# Patient Record
Sex: Male | Born: 1940
Health system: Southern US, Community
[De-identification: ages and names within clinical notes are randomized; demographics above are authoritative.]

## PROBLEM LIST (undated history)

## (undated) DIAGNOSIS — J189 Pneumonia, unspecified organism: Secondary | ICD-10-CM

## (undated) DIAGNOSIS — K8020 Calculus of gallbladder without cholecystitis without obstruction: Secondary | ICD-10-CM

## (undated) DIAGNOSIS — B019 Varicella without complication: Secondary | ICD-10-CM

## (undated) DIAGNOSIS — I4719 Other supraventricular tachycardia: Secondary | ICD-10-CM

## (undated) DIAGNOSIS — K219 Gastro-esophageal reflux disease without esophagitis: Secondary | ICD-10-CM

## (undated) DIAGNOSIS — I472 Ventricular tachycardia: Secondary | ICD-10-CM

## (undated) DIAGNOSIS — I4510 Unspecified right bundle-branch block: Secondary | ICD-10-CM

## (undated) DIAGNOSIS — N183 Chronic kidney disease, stage 3 unspecified: Secondary | ICD-10-CM

## (undated) DIAGNOSIS — E119 Type 2 diabetes mellitus without complications: Secondary | ICD-10-CM

## (undated) DIAGNOSIS — I071 Rheumatic tricuspid insufficiency: Secondary | ICD-10-CM

## (undated) DIAGNOSIS — E7800 Pure hypercholesterolemia, unspecified: Secondary | ICD-10-CM

## (undated) DIAGNOSIS — M1990 Unspecified osteoarthritis, unspecified site: Secondary | ICD-10-CM

## (undated) DIAGNOSIS — Z7189 Other specified counseling: Secondary | ICD-10-CM

## (undated) DIAGNOSIS — I10 Essential (primary) hypertension: Secondary | ICD-10-CM

## (undated) DIAGNOSIS — I5042 Chronic combined systolic (congestive) and diastolic (congestive) heart failure: Secondary | ICD-10-CM

## (undated) DIAGNOSIS — D649 Anemia, unspecified: Secondary | ICD-10-CM

## (undated) DIAGNOSIS — K760 Fatty (change of) liver, not elsewhere classified: Secondary | ICD-10-CM

## (undated) DIAGNOSIS — I471 Supraventricular tachycardia: Secondary | ICD-10-CM

## (undated) DIAGNOSIS — C9000 Multiple myeloma not having achieved remission: Secondary | ICD-10-CM

## (undated) DIAGNOSIS — I4729 Other ventricular tachycardia: Secondary | ICD-10-CM

## (undated) DIAGNOSIS — K2270 Barrett's esophagus without dysplasia: Secondary | ICD-10-CM

## (undated) DIAGNOSIS — C4491 Basal cell carcinoma of skin, unspecified: Secondary | ICD-10-CM

## (undated) DIAGNOSIS — I340 Nonrheumatic mitral (valve) insufficiency: Secondary | ICD-10-CM

## (undated) DIAGNOSIS — Z8489 Family history of other specified conditions: Secondary | ICD-10-CM

## (undated) DIAGNOSIS — Z950 Presence of cardiac pacemaker: Secondary | ICD-10-CM

## (undated) DIAGNOSIS — I8500 Esophageal varices without bleeding: Secondary | ICD-10-CM

## (undated) DIAGNOSIS — I428 Other cardiomyopathies: Secondary | ICD-10-CM

## (undated) HISTORY — PX: TRANSTHORACIC ECHOCARDIOGRAM: SHX275

## (undated) HISTORY — PX: TONSILLECTOMY AND ADENOIDECTOMY: SUR1326

## (undated) HISTORY — PX: CARPAL TUNNEL RELEASE: SHX101

## (undated) HISTORY — PX: LUMBAR DISC SURGERY: SHX700

## (undated) HISTORY — DX: Other specified counseling: Z71.89

## (undated) HISTORY — PX: BACK SURGERY: SHX140

## (undated) HISTORY — DX: Multiple myeloma not having achieved remission: C90.00

## (undated) HISTORY — PX: JOINT REPLACEMENT: SHX530

## (undated) HISTORY — DX: Fatty (change of) liver, not elsewhere classified: K76.0

## (undated) HISTORY — DX: Calculus of gallbladder without cholecystitis without obstruction: K80.20

## (undated) HISTORY — PX: CATARACT EXTRACTION W/ INTRAOCULAR LENS  IMPLANT, BILATERAL: SHX1307

## (undated) HISTORY — DX: Varicella without complication: B01.9

## (undated) HISTORY — PX: BASAL CELL CARCINOMA EXCISION: SHX1214

---

## 2000-06-30 ENCOUNTER — Encounter: Admission: RE | Admit: 2000-06-30 | Discharge: 2000-07-07 | Payer: Self-pay | Admitting: Specialist

## 2005-05-07 ENCOUNTER — Inpatient Hospital Stay (HOSPITAL_COMMUNITY): Admission: EM | Admit: 2005-05-07 | Discharge: 2005-05-10 | Payer: Self-pay | Admitting: Emergency Medicine

## 2005-05-11 ENCOUNTER — Inpatient Hospital Stay (HOSPITAL_COMMUNITY): Admission: EM | Admit: 2005-05-11 | Discharge: 2005-05-18 | Payer: Self-pay | Admitting: Emergency Medicine

## 2005-05-15 ENCOUNTER — Ambulatory Visit: Payer: Self-pay | Admitting: Internal Medicine

## 2008-04-15 HISTORY — PX: TOTAL KNEE ARTHROPLASTY: SHX125

## 2008-06-10 ENCOUNTER — Inpatient Hospital Stay (HOSPITAL_COMMUNITY): Admission: RE | Admit: 2008-06-10 | Discharge: 2008-06-13 | Payer: Self-pay | Admitting: Orthopedic Surgery

## 2008-06-30 ENCOUNTER — Emergency Department (HOSPITAL_COMMUNITY): Admission: EM | Admit: 2008-06-30 | Discharge: 2008-07-01 | Payer: Self-pay | Admitting: Emergency Medicine

## 2008-07-07 ENCOUNTER — Encounter: Admission: RE | Admit: 2008-07-07 | Discharge: 2008-08-18 | Payer: Self-pay | Admitting: Orthopedic Surgery

## 2010-07-26 LAB — POCT I-STAT, CHEM 8
BUN: 28 mg/dL — ABNORMAL HIGH (ref 6–23)
Creatinine, Ser: 1.8 mg/dL — ABNORMAL HIGH (ref 0.4–1.5)
Glucose, Bld: 119 mg/dL — ABNORMAL HIGH (ref 70–99)
HCT: 36 % — ABNORMAL LOW (ref 39.0–52.0)
Sodium: 139 mEq/L (ref 135–145)

## 2010-07-26 LAB — CBC
Hemoglobin: 11.2 g/dL — ABNORMAL LOW (ref 13.0–17.0)
MCHC: 34.1 g/dL (ref 30.0–36.0)
MCV: 96.3 fL (ref 78.0–100.0)
RBC: 3.4 MIL/uL — ABNORMAL LOW (ref 4.22–5.81)
RDW: 13 % (ref 11.5–15.5)
WBC: 4.2 10*3/uL (ref 4.0–10.5)
WBC: 8.1 10*3/uL (ref 4.0–10.5)

## 2010-07-26 LAB — PROTIME-INR: Prothrombin Time: 23.5 seconds — ABNORMAL HIGH (ref 11.6–15.2)

## 2010-07-26 LAB — GLUCOSE, CAPILLARY
Glucose-Capillary: 140 mg/dL — ABNORMAL HIGH (ref 70–99)
Glucose-Capillary: 133 mg/dL — ABNORMAL HIGH (ref 70–99)
Glucose-Capillary: 141 mg/dL — ABNORMAL HIGH (ref 70–99)

## 2010-07-31 LAB — BASIC METABOLIC PANEL
BUN: 23 mg/dL (ref 6–23)
BUN: 23 mg/dL (ref 6–23)
CO2: 26 mEq/L (ref 19–32)
CO2: 28 mEq/L (ref 19–32)
Calcium: 8.1 mg/dL — ABNORMAL LOW (ref 8.4–10.5)
Chloride: 100 mEq/L (ref 96–112)
Creatinine, Ser: 1.47 mg/dL (ref 0.4–1.5)
Creatinine, Ser: 1.68 mg/dL — ABNORMAL HIGH (ref 0.4–1.5)
GFR calc Af Amer: 49 mL/min — ABNORMAL LOW (ref >60)
GFR calc Af Amer: 58 mL/min — ABNORMAL LOW (ref >60)
GFR calc non Af Amer: 46 mL/min — ABNORMAL LOW (ref >60)
Glucose, Bld: 170 mg/dL — ABNORMAL HIGH (ref 70–99)
Glucose, Bld: 186 mg/dL — ABNORMAL HIGH (ref 70–99)
Potassium: 3.6 mEq/L (ref 3.5–5.1)
Potassium: 3.8 mEq/L (ref 3.5–5.1)
Potassium: 4 mEq/L (ref 3.5–5.1)

## 2010-07-31 LAB — COMPREHENSIVE METABOLIC PANEL
ALT: 45 U/L (ref 0–53)
AST: 37 U/L (ref 0–37)
Albumin: 4 g/dL (ref 3.5–5.2)
BUN: 24 mg/dL — ABNORMAL HIGH (ref 6–23)
Potassium: 3.9 mEq/L (ref 3.5–5.1)
Sodium: 142 mEq/L (ref 135–145)
Total Bilirubin: 1 mg/dL (ref 0.3–1.2)

## 2010-07-31 LAB — PROTIME-INR
INR: 1.2 (ref 0.00–1.49)
Prothrombin Time: 24.3 seconds — ABNORMAL HIGH (ref 11.6–15.2)

## 2010-07-31 LAB — CBC
HCT: 29.4 % — ABNORMAL LOW (ref 39.0–52.0)
HCT: 33.5 % — ABNORMAL LOW (ref 39.0–52.0)
HCT: 40.3 % (ref 39.0–52.0)
Hemoglobin: 9.9 g/dL — ABNORMAL LOW (ref 13.0–17.0)
MCHC: 33.8 g/dL (ref 30.0–36.0)
MCHC: 34.3 g/dL (ref 30.0–36.0)
MCV: 97.7 fL (ref 78.0–100.0)
MCV: 98.1 fL (ref 78.0–100.0)
Platelets: 193 10*3/uL (ref 150–400)
RBC: 3.01 MIL/uL — ABNORMAL LOW (ref 4.22–5.81)
RBC: 4.17 MIL/uL — ABNORMAL LOW (ref 4.22–5.81)
RDW: 12.5 % (ref 11.5–15.5)
WBC: 4.6 10*3/uL (ref 4.0–10.5)

## 2010-07-31 LAB — GLUCOSE, CAPILLARY
Glucose-Capillary: 132 mg/dL — ABNORMAL HIGH (ref 70–99)
Glucose-Capillary: 133 mg/dL — ABNORMAL HIGH (ref 70–99)
Glucose-Capillary: 135 mg/dL — ABNORMAL HIGH (ref 70–99)
Glucose-Capillary: 167 mg/dL — ABNORMAL HIGH (ref 70–99)
Glucose-Capillary: 183 mg/dL — ABNORMAL HIGH (ref 70–99)

## 2010-07-31 LAB — TYPE AND SCREEN: ABO/RH(D): A POS

## 2010-07-31 LAB — URINALYSIS, ROUTINE W REFLEX MICROSCOPIC
Ketones, ur: NEGATIVE mg/dL
Specific Gravity, Urine: 1.016 (ref 1.005–1.030)
pH: 6.5 (ref 5.0–8.0)

## 2010-07-31 LAB — ABO/RH: ABO/RH(D): A POS

## 2010-07-31 LAB — APTT: aPTT: 33 seconds (ref 24–37)

## 2010-08-28 NOTE — Op Note (Signed)
NAMEJACKSEN, Dillon Andrews              ACCOUNT NO.:  [id number]   MEDICAL RECORD NO.:  [id number]          PATIENT TYPE:  INP   LOCATION:  0004                         FACILITY:  [facility]   PHYSICIAN:  Gaynelle Arabian, M.D.    DATE OF BIRTH:  1941/02/18   DATE OF PROCEDURE:  06/10/2008  DATE OF DISCHARGE:                               OPERATIVE REPORT   PREOPERATIVE DIAGNOSIS:  Osteoarthritis left knee.   POSTOPERATIVE DIAGNOSIS:  Osteoarthritis left knee.   PROCEDURE:  Left total knee arthroplasty.   SURGEON:  Gaynelle Arabian, M.D.   ASSISTANT:  Alexzandrew L. Perkins, P.A.C.   ANESTHESIA:  Spinal.   ESTIMATED BLOOD LOSS:  Minimal.   DRAINS:  None.   TOURNIQUET TIME:  43 minutes at 300 mmHg.   COMPLICATIONS:  None.   CONDITION:  Stable to recovery.   BRIEF CLINICAL NOTE:  Mr. Deisher is a 70 year old male with end-stage  arthritis of his left knee with progressively worsening pain and  dysfunction.  He has failed nonoperative management and presents now for  left total knee arthroplasty.   PROCEDURE IN DETAIL:  After the successful administration of spinal  anesthetic a tourniquet is placed high on his left thigh and his left  lower extremity was prepped and draped in the usual sterile fashion.  Extremity was wrapped in Esmarch, knee flexed, tourniquet inflated to  300 mmHg.  Midline incision is made with a 10 blade through subcutaneous  tissue to the level of the extensor mechanism.  A fresh blade is used to  make a medial parapatellar arthrotomy.  Soft tissue over the proximal  medial tibia subperiosteally elevated to the joint line with the knife  and to the semimembranosus bursa with a Cobb elevator.  Soft tissue  laterally is elevated with attention being paid to avoid the patellar  tendon on the tibial tubercle.  The patella was subluxed laterally, knee  flexed to 90 degrees and ACL and PCL removed.  Drill was used to create  a starting hole in the distal femur and the  canal was thoroughly  irrigated.  The 5 degree left valgus alignment guide was placed and  referencing off the posterior condyles, rotation is marked and the block  pinned to remove 11 mm of the distal femur.  I took 11 because of a  preop flexion contracture.  Distal femoral resection is made with an  oscillating saw.  Sizing blocks placed, size 5 is most appropriate.  Rotation is marked off the epicondylar axis.  Size 5 cutting block is  placed and then the anterior-posterior chamfer cuts are made.   The tibia is subluxed forward and the menisci are removed.  Extramedullary tibial alignment guide is placed referencing proximally  at the medial aspect of the tibial tubercle and distally along the  second metatarsal axis and tibial crest.  Blocks pinned to remove 2 mm  off the more deficient medial side.  Tibial resection is made with an  oscillating saw.  Size 5 is the most appropriate tibial component and  the proximal tibia was prepared with the modular drill and keel punch  for the size 5.  Femoral preparation is completed with the intercondylar  cut with a size 5.   Size 5 mobile bearing tibial trial, size 5 posterior stabilized femoral  trial and a 10 mm posterior stabilized rotating platform insert trial  are placed.  With the 10 full extension was achieved with excellent  varus-valgus anterior-posterior balance throughout and full range of  motion.  Patella was everted and thickness measured to be 27 mm.  Freehand resection was taken to 15 mm, 41 template is placed, lug holes  were drilled, trial patella was placed and it tracks normally.  Osteophytes were then removed off the posterior femur with the trial in  place.  All trials were removed and the cut bone surfaces are prepared  with pulsatile lavage.  Cement was mixed and once ready for implantation  a size 5 mobile bearing tibial tray, size 5 posterior stabilized femur  and a 41 patella are cemented into place.  The patella  was held with a  clamp.  Trial 10 mm insert was placed and the knee held in full  extension and all extruded cement removed.  When the cement is fully  hardened then the permanent 10 mm posterior stabilized rotating platform  insert is placed into the tibial tray.  The wound was copiously  irrigated with saline solution and the FloSeal injected on the posterior  capsule, medial and lateral gutters of the suprapatellar area.  Moist  sponge is placed and tourniquet released for a total time of 43 minutes.  Sponge is held for 2 minutes and removed.  Minimal bleeding is  encountered.  The bleeding that is encountered was stopped with  electrocautery.  The wound was again irrigated and arthrotomy closed  with interrupted #1 PDS.  Flexion against gravity was about 140 degrees.  Subcu was closed with interrupted 2-0 Vicryl and subcuticular running 4-  0 Monocryl.  The incision is then cleaned and dried and Steri-Strips and  a bulky sterile dressing are applied.  He was then placed into a knee  immobilizer, awakened and transported to recovery in stable condition.      Gaynelle Arabian, M.D.  Electronically Signed     FA/MEDQ  D:  06/10/2008  T:  06/10/2008  Job:  [id number]

## 2010-08-28 NOTE — Discharge Summary (Signed)
NAMESAMWISE, ECKARDT              ACCOUNT NO.:  [id number]   MEDICAL RECORD NO.:  [id number]          PATIENT TYPE:  INP   LOCATION:  1612                         FACILITY:  [facility]   PHYSICIAN:  Gaynelle Arabian, M.D.    DATE OF BIRTH:  19-Feb-1941   DATE OF ADMISSION:  06/10/2008  DATE OF DISCHARGE:  06/13/2008                               DISCHARGE SUMMARY   ADMITTING DIAGNOSES:  1. Left knee end-stage arthritis.  2. Hypertension.  3. Hypercholesterolemia.  4. Diabetes.  5. Gastroesophageal reflux disease.   DISCHARGE DIAGNOSES:  1. Osteoarthritis left knee, status post left total knee replacement      arthroplasty.  2. Mild postoperative blood loss anemia; did not require transfusion.  3. Mild hyponatremia.  4. Slight/mild renal insufficiency postoperatively, resolved.  5. Hypertension.  6. Hypercholesterolemia.  7. Diabetes.  8. Gastroesophageal reflux disease.   PROCEDURE:  June 10, 2008 - left total knee.  Surgeon - Dr. Wynelle Link.  Assistant - Arlee Muslim, PA-C.  Spinal anesthesia.  Tourniquet time 43  minutes.   CONSULTATIONS:  None.   BRIEF HISTORY:  Mr. Kerrigan is a 70 year old male with end-stage  arthritis of the left knee, progressive worsening pain and dysfunction  who failed outpatient management and now presents for a total knee.   LABORATORY DATA:  Preoperative CBC showed a hemoglobin of 13.8,  hematocrit of 40.3, white cell count 4.6, platelets 193.  Postop  hemoglobin 11.5, drifted down to 9.9.  Last noted hemoglobin of 9.2 with  a hematocrit of 27.0.  PT/INR 13.5 and 1.0, PTT of 33.  Serial pro times  followed per Coumadin protocol.  Last noted PT/INR 24.1 and 2.0.  Chem  panel on admission showed a BUN slightly elevated at 24, creatinine  slightly elevated at 1.53.  Remaining Chem panel within normal limits.  Serial BMETs were followed.  BUN came down to a normal level of 20,  creatinine went up from 1.5 to 1.68, back down to 1.47.  Preoperative UA  negative.  Blood group type A+.   ELECTROCARDIOGRAM:  EKG on June 06, 2008 revealed sinus rhythm,  premature atrial complexes, left axis deviation, confirmed by Dr. Minus Breeding.   HOSPITAL COURSE:  The patient was admitted to [facility],  taken to OR and underwent the above-stated procedure without  complication.  The patient tolerated the procedure well and was later  transferred to the recovery room and orthopedic floor.  Doing very well  on the morning of day one.  He was already doing straight leg raises.  Pain under good control.  A little low-grade temperature.  Encouraged  incentive spirometer and antipyretics.  Had good urine output.  Blood  pressure medications were restarted.  He had a little bit of renal  insufficiency preoperative.  He had some mild azotemia and renal  insufficiency increased postoperative, although his output did well and  has currently come back down.  We held his metformin postoperatively.  We started on sliding scale.  We held that due to the increasing  creatinine also postoperative.  By day two, he was doing better.  His  creatinine had improved.  He started getting up out of bed with therapy.  He was placed on Coumadin for DVT prophylaxis.  He did well with  therapy, slowly improving, and by day three he was already walking 175  feet.  His mild renal insufficiency had improved.  The creatinine was  back down.  We resumed his metformin.  Hemoglobin dropped down to 9.2.  He was asymptomatic with this.  We did start him on some iron.  He was  ready to go home later than day.   DISCHARGE PLAN:  1. The patient was discharged home on June 13, 2008.  2. Discharge diagnoses - please see above.  3. Discharge medications - Percocet, Robaxin, Coumadin, Nu-Iron.  4. Diet - low-sodium, heart-healthy, low-cholesterol diet.  5. Activity - weightbearing as tolerated, total knee protocol.  6. Follow up in 2 weeks.   DISPOSITION:  Home.    CONDITION ON DISCHARGE:  Improving.      Alexzandrew L. Perkins, P.A.C.      Gaynelle Arabian, M.D.  Electronically Signed    ALP/MEDQ  D:  06/28/2008  T:  06/28/2008  Job:  [id number]   cc:   Ali Lowe, M.D.  [place]   Franki Cabot, M.D.
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
## 2010-08-31 NOTE — H&P (Signed)
Dillon Andrews, CAPPELLA              ACCOUNT NO.:  [id number]   MEDICAL RECORD NO.:  [id number]          PATIENT TYPE:  INP   LOCATION:  NA                           [facility]ACILITY:  [facility]   PHYSICIAN:  Gaynelle Arabian, M.D.    DATE OF BIRTH:  Aug 24, 1940   DATE OF ADMISSION:  06/10/2008  DATE OF DISCHARGE:                              HISTORY & PHYSICAL   CHIEF COMPLAINT:  Painful left knee.   HISTORY OF PRESENT ILLNESS:  Mr. Okazaki is a 70 year old gentleman who  has had problems with his left knee.  He has progressively worsened over  the last several years.  He is having difficulty with range of motion.  He is having a hard time ambulating due to the pain.  It does bother him  at night.  X-rays reveal he has end-stage osteoarthritis of the knee  with bone on bone medial compartments and patellofemoral changes with  some varus deformity.  The patient has elected to proceed with a total  knee arthroplasty.   PRIMARY CARE PHYSICIAN:  Dr. Ali Lowe in [facility].   GI SPECIALIST:  Dr. Franki Cabot.   ALLERGIES:  No known drug allergies.   CURRENT MEDICATIONS:  1. Amlodipine 10 mg a day.  2. Crestor 10 mg a day.  3. Lisinopril 40 mg a day.  4. Metformin ACL ER 500 mg once a day.  5. Hydrochlorothiazide 12.5 mg a day.   PRESENT MEDICAL HISTORY:  1. Includes hypertension.  2. Hypercholesterolemia.  3. Diabetes.   REVIEW OF SYSTEMS:  Negative for any neurologic issues.  He does have  partial lower dentures.  He denies any pulmonary issues.  CARDIOVASCULAR:  He does have stable hypertension and  hypercholesterolemia.  He has never had any chest pains, irregular heart  rhythms, shortness of breath.  His last stress test was greater than 10  years previous.  GI:  His reflux is well controlled on occasional  Prevacid and Prilosec.  He denies any other liver, gallbladder or  diverticulitis or colitis issues.  GU is unremarkable.  Endocrine is  unremarkable other than his diabetes  and he states he usually checks in  the morning about 120.   PAST SURGICAL HISTORY:  Includes back surgery in 1970 and 1976.  He has  had a right-sided biceps tendon repair.  He has had bilateral wrist  repairs by Dr. Amedeo Plenty.  He denies any complications with anesthesia.   FAMILY MEDICAL HISTORY:  Father is deceased from stomach cancer at the  age of 52.  Mother is deceased at the age of 77 from heart disease.  He  has got a brother with diabetes and liver disease.  One sister with a  history of breast cancer.   SOCIAL HISTORY:  The patient is married.  He is retired.  He has smoked  greater than 40 years ago, less than a pack a week.  He has the  occasional alcoholic beverage.  He has 4 grown children and lives in a  [place] house.   PHYSICAL EXAM:  VITALS:  Height is 5 feet 11 inches, weight is  205,  temperature is afebrile.  Pulse of 70 and irregular, respirations 12,  blood pressure is 132/78.  GENERAL:  This is a healthy-appearing, well-developed gentleman  conscious, alert and appropriate, appears to be in no distress.  HEENT:  Head was normocephalic.  Pupils equal, round and reactive.  Gross hearing is intact.  NECK:  Supple.  No palpable lymphadenopathy.  Good range of motion.  CHEST:  Lung sounds were clear and equal bilaterally.  HEART:  Regular rate and rhythm.  ABDOMEN:  Soft, nontender.  Bowel sounds present.  UPPER EXTREMITIES:  Had excellent range of motion of shoulders, elbows  and wrists.  Motor strength 5/5.  LOWER EXTREMITIES:  Both hips had excellent range of motion.  His right  knee, he lacked about 5 degrees extension.  He was able to flex it back  to 120.  He had obvious varus deformity.  His left knee had full  extension.  He was able to flex it back to 120.  He had no instability.  Both calves were soft, nontender.  He had good motion of both ankles.  PERIPHERAL VASCULAR:  Carotid pulses were 2+, no bruits.  Radial pulses  were 2+, dorsalis pedis pulses  1+.  He had no lower extremity  pigmentation changes or varicosities.  NEURO:  The patient was conscious, alert and appropriate.   IMPRESSION:  1. End-stage osteoarthritis right knee.  2. Hypertension.  3. Hypercholesterolemia.  4. Diabetes.  5. Gastroesophageal reflux disease.   PLAN:  The patient will undergo all routine labs and tests prior to  having a left total knee arthroplasty by Dr. Wynelle Link at [facility] on June 10, 2008.      Evert Kohl, P.A.      Gaynelle Arabian, M.D.  Electronically Signed    RWK/MEDQ  D:  05/24/2008  T:  05/24/2008  Job:  [id number]   cc:   Gaynelle Arabian, M.D.  Fax: [phone]
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
## 2012-09-19 DIAGNOSIS — K210 Gastro-esophageal reflux disease with esophagitis: Secondary | ICD-10-CM

## 2013-01-18 DIAGNOSIS — N529 Male erectile dysfunction, unspecified: Secondary | ICD-10-CM | POA: Insufficient documentation

## 2013-01-18 DIAGNOSIS — M25579 Pain in unspecified ankle and joints of unspecified foot: Secondary | ICD-10-CM | POA: Insufficient documentation

## 2013-07-07 DIAGNOSIS — E785 Hyperlipidemia, unspecified: Secondary | ICD-10-CM | POA: Insufficient documentation

## 2013-07-07 DIAGNOSIS — Z0000 Encounter for general adult medical examination without abnormal findings: Secondary | ICD-10-CM | POA: Insufficient documentation

## 2013-07-07 DIAGNOSIS — I10 Essential (primary) hypertension: Secondary | ICD-10-CM | POA: Insufficient documentation

## 2014-01-12 DIAGNOSIS — R590 Localized enlarged lymph nodes: Secondary | ICD-10-CM | POA: Insufficient documentation

## 2014-08-22 DIAGNOSIS — M5442 Lumbago with sciatica, left side: Secondary | ICD-10-CM | POA: Insufficient documentation

## 2014-11-26 DIAGNOSIS — M1610 Unilateral primary osteoarthritis, unspecified hip: Secondary | ICD-10-CM | POA: Insufficient documentation

## 2014-11-26 DIAGNOSIS — S76019A Strain of muscle, fascia and tendon of unspecified hip, initial encounter: Secondary | ICD-10-CM | POA: Insufficient documentation

## 2014-11-26 DIAGNOSIS — M545 Low back pain, unspecified: Secondary | ICD-10-CM | POA: Insufficient documentation

## 2014-11-26 DIAGNOSIS — E119 Type 2 diabetes mellitus without complications: Secondary | ICD-10-CM

## 2014-12-23 DIAGNOSIS — Z23 Encounter for immunization: Secondary | ICD-10-CM | POA: Insufficient documentation

## 2014-12-28 DIAGNOSIS — M48061 Spinal stenosis, lumbar region without neurogenic claudication: Secondary | ICD-10-CM | POA: Insufficient documentation

## 2015-01-08 DIAGNOSIS — M5416 Radiculopathy, lumbar region: Secondary | ICD-10-CM | POA: Insufficient documentation

## 2015-01-25 DIAGNOSIS — E782 Mixed hyperlipidemia: Secondary | ICD-10-CM | POA: Insufficient documentation

## 2015-08-01 DIAGNOSIS — K7460 Unspecified cirrhosis of liver: Secondary | ICD-10-CM | POA: Insufficient documentation

## 2015-08-01 DIAGNOSIS — E119 Type 2 diabetes mellitus without complications: Secondary | ICD-10-CM | POA: Insufficient documentation

## 2015-08-01 DIAGNOSIS — I8500 Esophageal varices without bleeding: Secondary | ICD-10-CM | POA: Insufficient documentation

## 2015-08-01 DIAGNOSIS — I8510 Secondary esophageal varices without bleeding: Secondary | ICD-10-CM | POA: Diagnosis present

## 2015-08-01 DIAGNOSIS — K2270 Barrett's esophagus without dysplasia: Secondary | ICD-10-CM | POA: Insufficient documentation

## 2015-08-01 DIAGNOSIS — Z86010 Personal history of colonic polyps: Secondary | ICD-10-CM | POA: Insufficient documentation

## 2015-08-18 DIAGNOSIS — E109 Type 1 diabetes mellitus without complications: Secondary | ICD-10-CM | POA: Insufficient documentation

## 2015-08-18 DIAGNOSIS — R7989 Other specified abnormal findings of blood chemistry: Secondary | ICD-10-CM | POA: Insufficient documentation

## 2015-08-18 DIAGNOSIS — Z125 Encounter for screening for malignant neoplasm of prostate: Secondary | ICD-10-CM | POA: Insufficient documentation

## 2015-08-18 DIAGNOSIS — R600 Localized edema: Secondary | ICD-10-CM | POA: Insufficient documentation

## 2015-12-11 DIAGNOSIS — N183 Chronic kidney disease, stage 3 unspecified: Secondary | ICD-10-CM | POA: Insufficient documentation

## 2015-12-11 DIAGNOSIS — E1122 Type 2 diabetes mellitus with diabetic chronic kidney disease: Secondary | ICD-10-CM | POA: Insufficient documentation

## 2015-12-11 DIAGNOSIS — I129 Hypertensive chronic kidney disease with stage 1 through stage 4 chronic kidney disease, or unspecified chronic kidney disease: Secondary | ICD-10-CM | POA: Insufficient documentation

## 2016-06-05 DIAGNOSIS — N289 Disorder of kidney and ureter, unspecified: Secondary | ICD-10-CM | POA: Insufficient documentation

## 2016-09-13 DIAGNOSIS — J189 Pneumonia, unspecified organism: Secondary | ICD-10-CM

## 2016-09-13 HISTORY — PX: CARDIAC CATHETERIZATION: SHX172

## 2016-09-13 HISTORY — PX: TRANSESOPHAGEAL ECHOCARDIOGRAM: SHX273

## 2016-09-13 HISTORY — DX: Pneumonia, unspecified organism: J18.9

## 2016-09-20 DIAGNOSIS — R0609 Other forms of dyspnea: Secondary | ICD-10-CM | POA: Insufficient documentation

## 2016-09-20 DIAGNOSIS — I959 Hypotension, unspecified: Secondary | ICD-10-CM | POA: Diagnosis present

## 2016-09-20 DIAGNOSIS — R9431 Abnormal electrocardiogram [ECG] [EKG]: Secondary | ICD-10-CM | POA: Insufficient documentation

## 2016-09-21 DIAGNOSIS — I5022 Chronic systolic (congestive) heart failure: Secondary | ICD-10-CM | POA: Insufficient documentation

## 2016-09-25 DIAGNOSIS — R0681 Apnea, not elsewhere classified: Secondary | ICD-10-CM | POA: Insufficient documentation

## 2016-10-11 DIAGNOSIS — R0601 Orthopnea: Secondary | ICD-10-CM | POA: Insufficient documentation

## 2016-10-13 HISTORY — PX: TRANSTHORACIC ECHOCARDIOGRAM: SHX275

## 2016-10-25 DIAGNOSIS — I5040 Unspecified combined systolic (congestive) and diastolic (congestive) heart failure: Secondary | ICD-10-CM | POA: Insufficient documentation

## 2016-11-06 DIAGNOSIS — T887XXA Unspecified adverse effect of drug or medicament, initial encounter: Secondary | ICD-10-CM | POA: Insufficient documentation

## 2016-11-06 DIAGNOSIS — R945 Abnormal results of liver function studies: Secondary | ICD-10-CM

## 2016-11-06 DIAGNOSIS — R140 Abdominal distension (gaseous): Secondary | ICD-10-CM

## 2016-11-06 DIAGNOSIS — R7989 Other specified abnormal findings of blood chemistry: Secondary | ICD-10-CM | POA: Insufficient documentation

## 2016-11-06 DIAGNOSIS — R110 Nausea: Secondary | ICD-10-CM | POA: Insufficient documentation

## 2016-11-07 DIAGNOSIS — R778 Other specified abnormalities of plasma proteins: Secondary | ICD-10-CM | POA: Diagnosis present

## 2016-11-07 DIAGNOSIS — R7989 Other specified abnormal findings of blood chemistry: Secondary | ICD-10-CM

## 2016-11-07 DIAGNOSIS — J189 Pneumonia, unspecified organism: Secondary | ICD-10-CM | POA: Insufficient documentation

## 2016-11-07 DIAGNOSIS — R071 Chest pain on breathing: Secondary | ICD-10-CM | POA: Insufficient documentation

## 2016-11-07 DIAGNOSIS — G4761 Periodic limb movement disorder: Secondary | ICD-10-CM | POA: Insufficient documentation

## 2016-11-13 DIAGNOSIS — K8020 Calculus of gallbladder without cholecystitis without obstruction: Secondary | ICD-10-CM

## 2016-11-13 DIAGNOSIS — D649 Anemia, unspecified: Secondary | ICD-10-CM

## 2016-11-13 HISTORY — DX: Anemia, unspecified: D64.9

## 2016-11-13 HISTORY — DX: Calculus of gallbladder without cholecystitis without obstruction: K80.20

## 2016-11-19 ENCOUNTER — Emergency Department (HOSPITAL_BASED_OUTPATIENT_CLINIC_OR_DEPARTMENT_OTHER): Payer: Medicare HMO

## 2016-11-19 ENCOUNTER — Encounter (HOSPITAL_BASED_OUTPATIENT_CLINIC_OR_DEPARTMENT_OTHER): Payer: Self-pay

## 2016-11-19 ENCOUNTER — Inpatient Hospital Stay (HOSPITAL_BASED_OUTPATIENT_CLINIC_OR_DEPARTMENT_OTHER)
Admission: EM | Admit: 2016-11-19 | Discharge: 2016-11-28 | DRG: 417 | Disposition: A | Discharge status: Home / Self Care | Payer: Medicare HMO | Attending: Family Medicine | Admitting: Family Medicine

## 2016-11-19 ENCOUNTER — Inpatient Hospital Stay (HOSPITAL_COMMUNITY): Payer: Medicare HMO

## 2016-11-19 DIAGNOSIS — R7989 Other specified abnormal findings of blood chemistry: Secondary | ICD-10-CM | POA: Diagnosis present

## 2016-11-19 DIAGNOSIS — Z7982 Long term (current) use of aspirin: Secondary | ICD-10-CM

## 2016-11-19 DIAGNOSIS — G4700 Insomnia, unspecified: Secondary | ICD-10-CM | POA: Diagnosis not present

## 2016-11-19 DIAGNOSIS — E1122 Type 2 diabetes mellitus with diabetic chronic kidney disease: Secondary | ICD-10-CM | POA: Diagnosis not present

## 2016-11-19 DIAGNOSIS — R1013 Epigastric pain: Secondary | ICD-10-CM | POA: Diagnosis not present

## 2016-11-19 DIAGNOSIS — K2270 Barrett's esophagus without dysplasia: Secondary | ICD-10-CM | POA: Diagnosis present

## 2016-11-19 DIAGNOSIS — K219 Gastro-esophageal reflux disease without esophagitis: Secondary | ICD-10-CM | POA: Diagnosis present

## 2016-11-19 DIAGNOSIS — R0603 Acute respiratory distress: Secondary | ICD-10-CM | POA: Diagnosis not present

## 2016-11-19 DIAGNOSIS — G2581 Restless legs syndrome: Secondary | ICD-10-CM | POA: Diagnosis present

## 2016-11-19 DIAGNOSIS — Z7984 Long term (current) use of oral hypoglycemic drugs: Secondary | ICD-10-CM

## 2016-11-19 DIAGNOSIS — K668 Other specified disorders of peritoneum: Secondary | ICD-10-CM | POA: Diagnosis not present

## 2016-11-19 DIAGNOSIS — I440 Atrioventricular block, first degree: Secondary | ICD-10-CM | POA: Diagnosis present

## 2016-11-19 DIAGNOSIS — J90 Pleural effusion, not elsewhere classified: Secondary | ICD-10-CM | POA: Diagnosis present

## 2016-11-19 DIAGNOSIS — K7460 Unspecified cirrhosis of liver: Secondary | ICD-10-CM | POA: Diagnosis not present

## 2016-11-19 DIAGNOSIS — E119 Type 2 diabetes mellitus without complications: Secondary | ICD-10-CM

## 2016-11-19 DIAGNOSIS — K8012 Calculus of gallbladder with acute and chronic cholecystitis without obstruction: Secondary | ICD-10-CM | POA: Diagnosis not present

## 2016-11-19 DIAGNOSIS — Z9889 Other specified postprocedural states: Secondary | ICD-10-CM

## 2016-11-19 DIAGNOSIS — N183 Chronic kidney disease, stage 3 unspecified: Secondary | ICD-10-CM | POA: Diagnosis present

## 2016-11-19 DIAGNOSIS — I214 Non-ST elevation (NSTEMI) myocardial infarction: Secondary | ICD-10-CM

## 2016-11-19 DIAGNOSIS — R1900 Intra-abdominal and pelvic swelling, mass and lump, unspecified site: Secondary | ICD-10-CM

## 2016-11-19 DIAGNOSIS — D62 Acute posthemorrhagic anemia: Secondary | ICD-10-CM | POA: Diagnosis not present

## 2016-11-19 DIAGNOSIS — I5022 Chronic systolic (congestive) heart failure: Secondary | ICD-10-CM | POA: Diagnosis not present

## 2016-11-19 DIAGNOSIS — Y95 Nosocomial condition: Secondary | ICD-10-CM | POA: Diagnosis not present

## 2016-11-19 DIAGNOSIS — K7581 Nonalcoholic steatohepatitis (NASH): Secondary | ICD-10-CM | POA: Diagnosis present

## 2016-11-19 DIAGNOSIS — Z79899 Other long term (current) drug therapy: Secondary | ICD-10-CM

## 2016-11-19 DIAGNOSIS — R778 Other specified abnormalities of plasma proteins: Secondary | ICD-10-CM | POA: Diagnosis present

## 2016-11-19 DIAGNOSIS — R0600 Dyspnea, unspecified: Secondary | ICD-10-CM

## 2016-11-19 DIAGNOSIS — I10 Essential (primary) hypertension: Secondary | ICD-10-CM | POA: Diagnosis not present

## 2016-11-19 DIAGNOSIS — Z09 Encounter for follow-up examination after completed treatment for conditions other than malignant neoplasm: Secondary | ICD-10-CM | POA: Insufficient documentation

## 2016-11-19 DIAGNOSIS — R0602 Shortness of breath: Secondary | ICD-10-CM

## 2016-11-19 DIAGNOSIS — R748 Abnormal levels of other serum enzymes: Secondary | ICD-10-CM | POA: Diagnosis present

## 2016-11-19 DIAGNOSIS — Z87891 Personal history of nicotine dependence: Secondary | ICD-10-CM

## 2016-11-19 DIAGNOSIS — D649 Anemia, unspecified: Secondary | ICD-10-CM | POA: Diagnosis present

## 2016-11-19 DIAGNOSIS — I4510 Unspecified right bundle-branch block: Secondary | ICD-10-CM | POA: Diagnosis present

## 2016-11-19 DIAGNOSIS — J189 Pneumonia, unspecified organism: Secondary | ICD-10-CM | POA: Diagnosis present

## 2016-11-19 DIAGNOSIS — K819 Cholecystitis, unspecified: Secondary | ICD-10-CM | POA: Diagnosis present

## 2016-11-19 DIAGNOSIS — I130 Hypertensive heart and chronic kidney disease with heart failure and stage 1 through stage 4 chronic kidney disease, or unspecified chronic kidney disease: Secondary | ICD-10-CM | POA: Diagnosis present

## 2016-11-19 DIAGNOSIS — R630 Anorexia: Secondary | ICD-10-CM | POA: Diagnosis present

## 2016-11-19 DIAGNOSIS — I2510 Atherosclerotic heart disease of native coronary artery without angina pectoris: Secondary | ICD-10-CM | POA: Diagnosis present

## 2016-11-19 DIAGNOSIS — I8510 Secondary esophageal varices without bleeding: Secondary | ICD-10-CM | POA: Diagnosis present

## 2016-11-19 DIAGNOSIS — E785 Hyperlipidemia, unspecified: Secondary | ICD-10-CM | POA: Diagnosis present

## 2016-11-19 DIAGNOSIS — R1011 Right upper quadrant pain: Secondary | ICD-10-CM

## 2016-11-19 DIAGNOSIS — J181 Lobar pneumonia, unspecified organism: Secondary | ICD-10-CM

## 2016-11-19 DIAGNOSIS — I5023 Acute on chronic systolic (congestive) heart failure: Secondary | ICD-10-CM | POA: Diagnosis present

## 2016-11-19 DIAGNOSIS — I071 Rheumatic tricuspid insufficiency: Secondary | ICD-10-CM | POA: Diagnosis present

## 2016-11-19 DIAGNOSIS — I428 Other cardiomyopathies: Secondary | ICD-10-CM | POA: Diagnosis present

## 2016-11-19 HISTORY — DX: Esophageal varices without bleeding: I85.00

## 2016-11-19 HISTORY — DX: Pure hypercholesterolemia, unspecified: E78.00

## 2016-11-19 HISTORY — DX: Essential (primary) hypertension: I10

## 2016-11-19 HISTORY — DX: Pneumonia, unspecified organism: J18.9

## 2016-11-19 HISTORY — DX: Barrett's esophagus without dysplasia: K22.70

## 2016-11-19 LAB — CBC WITH DIFFERENTIAL/PLATELET
BASOS ABS: 0 10*3/uL (ref 0.0–0.1)
BASOS PCT: 0 %
EOS PCT: 1 %
Eosinophils Absolute: 0.1 10*3/uL (ref 0.0–0.7)
HCT: 43 % (ref 39.0–52.0)
Hemoglobin: 14.3 g/dL (ref 13.0–17.0)
Lymphocytes Relative: 10 %
Lymphs Abs: 1 10*3/uL (ref 0.7–4.0)
MCH: 32.4 pg (ref 26.0–34.0)
MCHC: 33.3 g/dL (ref 30.0–36.0)
MCV: 97.5 fL (ref 78.0–100.0)
MONO ABS: 1 10*3/uL (ref 0.1–1.0)
Monocytes Relative: 10 %
NEUTROS ABS: 7.9 10*3/uL — AB (ref 1.7–7.7)
Neutrophils Relative %: 79 %
PLATELETS: 180 10*3/uL (ref 150–400)
RBC: 4.41 MIL/uL (ref 4.22–5.81)
RDW: 15.2 % (ref 11.5–15.5)
WBC: 10 10*3/uL (ref 4.0–10.5)

## 2016-11-19 LAB — COMPREHENSIVE METABOLIC PANEL
ALT: 61 U/L (ref 17–63)
AST: 64 U/L — ABNORMAL HIGH (ref 15–41)
Albumin: 3.2 g/dL — ABNORMAL LOW (ref 3.5–5.0)
Alkaline Phosphatase: 208 U/L — ABNORMAL HIGH (ref 38–126)
Anion gap: 11 (ref 5–15)
BUN: 26 mg/dL — AB (ref 6–20)
CHLORIDE: 100 mmol/L — AB (ref 101–111)
CO2: 30 mmol/L (ref 22–32)
CREATININE: 1.69 mg/dL — AB (ref 0.61–1.24)
Calcium: 8.6 mg/dL — ABNORMAL LOW (ref 8.9–10.3)
GFR calc non Af Amer: 37 mL/min — ABNORMAL LOW (ref >60)
GFR, EST AFRICAN AMERICAN: 43 mL/min — AB (ref >60)
Glucose, Bld: 101 mg/dL — ABNORMAL HIGH (ref 65–99)
POTASSIUM: 3.8 mmol/L (ref 3.5–5.1)
SODIUM: 141 mmol/L (ref 135–145)
Total Bilirubin: 1.2 mg/dL (ref 0.3–1.2)
Total Protein: 6.4 g/dL — ABNORMAL LOW (ref 6.5–8.1)

## 2016-11-19 LAB — URINALYSIS, ROUTINE W REFLEX MICROSCOPIC
Bilirubin Urine: NEGATIVE
Glucose, UA: NEGATIVE mg/dL
Hgb urine dipstick: NEGATIVE
KETONES UR: NEGATIVE mg/dL
LEUKOCYTES UA: NEGATIVE
NITRITE: NEGATIVE
PROTEIN: NEGATIVE mg/dL
Specific Gravity, Urine: 1.006 (ref 1.005–1.030)
pH: 7.5 (ref 5.0–8.0)

## 2016-11-19 LAB — GLUCOSE, CAPILLARY: GLUCOSE-CAPILLARY: 122 mg/dL — AB (ref 65–99)

## 2016-11-19 LAB — BRAIN NATRIURETIC PEPTIDE: B NATRIURETIC PEPTIDE 5: 439.5 pg/mL — AB (ref 0.0–100.0)

## 2016-11-19 LAB — TROPONIN I
TROPONIN I: 0.29 ng/mL — AB (ref <0.03)
Troponin I: 0.23 ng/mL (ref <0.03)

## 2016-11-19 LAB — LIPASE, BLOOD: LIPASE: 35 U/L (ref 11–51)

## 2016-11-19 MED ORDER — ONDANSETRON HCL 4 MG/2ML IJ SOLN
INTRAMUSCULAR | Status: AC
  Filled 2016-11-19: qty 2

## 2016-11-19 MED ORDER — PANTOPRAZOLE SODIUM 40 MG PO TBEC
80.0000 mg | DELAYED_RELEASE_TABLET | Freq: Every day | ORAL | Status: DC
  Administered 2016-11-20 – 2016-11-28 (×9): 80 mg via ORAL
  Filled 2016-11-19 (×9): qty 2

## 2016-11-19 MED ORDER — FAMOTIDINE IN NACL 20-0.9 MG/50ML-% IV SOLN
20.0000 mg | Freq: Once | INTRAVENOUS | Status: AC
  Administered 2016-11-19: 20 mg via INTRAVENOUS
  Filled 2016-11-19: qty 50

## 2016-11-19 MED ORDER — ROSUVASTATIN CALCIUM 10 MG PO TABS
10.0000 mg | ORAL_TABLET | Freq: Every day | ORAL | Status: DC
  Administered 2016-11-20 – 2016-11-28 (×9): 10 mg via ORAL
  Filled 2016-11-19 (×9): qty 1

## 2016-11-19 MED ORDER — HEPARIN SODIUM (PORCINE) 5000 UNIT/ML IJ SOLN
5000.0000 [IU] | Freq: Three times a day (TID) | INTRAMUSCULAR | Status: AC
  Administered 2016-11-19 – 2016-11-21 (×6): 5000 [IU] via SUBCUTANEOUS
  Filled 2016-11-19 (×6): qty 1

## 2016-11-19 MED ORDER — METOPROLOL SUCCINATE ER 25 MG PO TB24
25.0000 mg | ORAL_TABLET | Freq: Every day | ORAL | Status: DC
  Administered 2016-11-20 – 2016-11-25 (×6): 25 mg via ORAL
  Filled 2016-11-19 (×6): qty 1

## 2016-11-19 MED ORDER — INSULIN ASPART 100 UNIT/ML ~~LOC~~ SOLN
0.0000 [IU] | Freq: Three times a day (TID) | SUBCUTANEOUS | Status: DC
  Administered 2016-11-20: 1 [IU] via SUBCUTANEOUS
  Administered 2016-11-21: 2 [IU] via SUBCUTANEOUS
  Administered 2016-11-21 – 2016-11-22 (×4): 1 [IU] via SUBCUTANEOUS
  Administered 2016-11-23 – 2016-11-24 (×4): 2 [IU] via SUBCUTANEOUS
  Administered 2016-11-24: 1 [IU] via SUBCUTANEOUS
  Administered 2016-11-24 – 2016-11-25 (×2): 2 [IU] via SUBCUTANEOUS
  Administered 2016-11-25 – 2016-11-26 (×4): 1 [IU] via SUBCUTANEOUS
  Administered 2016-11-26 – 2016-11-27 (×2): 2 [IU] via SUBCUTANEOUS
  Administered 2016-11-27 – 2016-11-28 (×2): 1 [IU] via SUBCUTANEOUS

## 2016-11-19 MED ORDER — ONDANSETRON HCL 4 MG/2ML IJ SOLN
4.0000 mg | Freq: Once | INTRAMUSCULAR | Status: AC
  Administered 2016-11-19: 4 mg via INTRAVENOUS

## 2016-11-19 MED ORDER — ONDANSETRON HCL 4 MG/2ML IJ SOLN
4.0000 mg | Freq: Four times a day (QID) | INTRAMUSCULAR | Status: DC | PRN
  Administered 2016-11-20 – 2016-11-24 (×5): 4 mg via INTRAVENOUS
  Filled 2016-11-19 (×5): qty 2

## 2016-11-19 MED ORDER — CEFEPIME HCL 1 G IJ SOLR
1.0000 g | Freq: Once | INTRAMUSCULAR | Status: AC
  Administered 2016-11-19: 1 g via INTRAVENOUS

## 2016-11-19 MED ORDER — ZOLPIDEM TARTRATE 5 MG PO TABS
5.0000 mg | ORAL_TABLET | Freq: Every evening | ORAL | Status: DC | PRN
  Administered 2016-11-19: 5 mg via ORAL
  Filled 2016-11-19: qty 1

## 2016-11-19 MED ORDER — FUROSEMIDE 40 MG PO TABS
40.0000 mg | ORAL_TABLET | Freq: Two times a day (BID) | ORAL | Status: DC
  Administered 2016-11-20 – 2016-11-25 (×12): 40 mg via ORAL
  Filled 2016-11-19 (×12): qty 1

## 2016-11-19 MED ORDER — GABAPENTIN 100 MG PO CAPS
100.0000 mg | ORAL_CAPSULE | Freq: Every day | ORAL | Status: DC
  Administered 2016-11-19 – 2016-11-27 (×9): 100 mg via ORAL
  Filled 2016-11-19 (×8): qty 1

## 2016-11-19 MED ORDER — ACETAMINOPHEN 325 MG PO TABS
650.0000 mg | ORAL_TABLET | Freq: Four times a day (QID) | ORAL | Status: DC | PRN
  Administered 2016-11-20 – 2016-11-27 (×2): 650 mg via ORAL
  Filled 2016-11-19 (×2): qty 2

## 2016-11-19 MED ORDER — VANCOMYCIN HCL IN DEXTROSE 1-5 GM/200ML-% IV SOLN
1000.0000 mg | Freq: Once | INTRAVENOUS | Status: AC
  Administered 2016-11-19: 1000 mg via INTRAVENOUS
  Filled 2016-11-19: qty 200

## 2016-11-19 MED ORDER — ASPIRIN 81 MG PO CHEW
81.0000 mg | CHEWABLE_TABLET | Freq: Every day | ORAL | Status: DC
  Administered 2016-11-20 – 2016-11-28 (×9): 81 mg via ORAL
  Filled 2016-11-19 (×9): qty 1

## 2016-11-19 MED ORDER — ENOXAPARIN SODIUM 40 MG/0.4ML ~~LOC~~ SOLN: 40.0000 mg | SUBCUTANEOUS | Status: DC

## 2016-11-19 MED ORDER — POTASSIUM CHLORIDE CRYS ER 20 MEQ PO TBCR
20.0000 meq | EXTENDED_RELEASE_TABLET | Freq: Every day | ORAL | Status: DC
  Administered 2016-11-20 – 2016-11-28 (×9): 20 meq via ORAL
  Filled 2016-11-19 (×9): qty 1

## 2016-11-19 MED ORDER — CEFEPIME HCL 1 G IJ SOLR
INTRAMUSCULAR | Status: AC
  Filled 2016-11-19: qty 1

## 2016-11-19 NOTE — ED Triage Notes (Signed)
C/o nausea x 2 months-was admitted to [facility] x 11 days-d/c yesterday-states he was dx with PNE, fluid drawn off lung and had a clean cardiac cath-pt NAD-steady gait

## 2016-11-19 NOTE — ED Notes (Signed)
Report given to Jessica RN

## 2016-11-19 NOTE — ED Notes (Signed)
Pt on monitor 

## 2016-11-19 NOTE — ED Provider Notes (Signed)
[facility] Provider Note   CSN: [id number] Arrival date & time: 11/19/16  1203     History   Chief Complaint Chief Complaint  Patient presents with  . Nausea    HPI Dillon Andrews is a 76 y.o. male.  Patient is a 76 year old male with a history of congestive heart failure with an EF of 35-40%, moderate to severe tricuspid regurgitation, diabetes, hypertension and hyperlipidemia who presents with nausea and epigastric pain. He was recently admitted to [facility] hospital for CHF exacerbation. He improved after being started on Lasix drip which was transitioned to his oral Lasix. He was discharged yesterday. He's been feeling better as far as his heart failure goes. His breathing is much better. He denies any chest pain. His leg swelling has improved. He's been having some ongoing nausea for about 2 months. He had a gastric emptying study which was normal. He did have ultrasound of gallbladder which show gallstones but a subsequent had a negative hiatus scan. Last night he had an episode of pain in his epigastrium which lasted about an hour. He drink some lemonade and it went away but he is not sure if it was related shrinking lemonade. He never had any chest pain or shortness of breath. His nausea is at baseline. He's had no vomiting. He is having normal bowel movements. No fevers. He went to his PCP office this morning for a hospital follow-up and was sent here for evaluation due to the epigastric pain. He hasn't had any further symptoms since last night. His pain last night occurred about 11:00 PM.      Past Medical History:  Diagnosis Date  . CHF (congestive heart failure) ([place])   . Diabetes mellitus without complication ([place])   . High cholesterol   . Hypertension   . Pneumonia     There are no active problems to display for this patient.   Past Surgical History:  Procedure Laterality Date  . BACK SURGERY    . CATARACT EXTRACTION    . HAND TENDON  SURGERY    . KNEE SURGERY         Home Medications    Prior to Admission medications   Not on File    Family History No family history on file.  Social History Social History  Substance Use Topics  . Smoking status: Former Research scientist (life sciences)  . Smokeless tobacco: Never Used  . Alcohol use Yes     Comment: occ     Allergies   Patient has no known allergies.   Review of Systems Review of Systems  Constitutional: Negative for chills, diaphoresis, fatigue and fever.  HENT: Negative for congestion, rhinorrhea and sneezing.   Eyes: Negative.   Respiratory: Negative for cough, chest tightness and shortness of breath.   Cardiovascular: Negative for chest pain and leg swelling.  Gastrointestinal: Positive for abdominal pain and nausea. Negative for blood in stool, diarrhea and vomiting.  Genitourinary: Negative for difficulty urinating, flank pain, frequency and hematuria.  Musculoskeletal: Negative for arthralgias and back pain.  Skin: Negative for rash.  Neurological: Negative for dizziness, speech difficulty, weakness, numbness and headaches.     Physical Exam Updated Vital Signs BP 133/69 (BP Location: Right Arm)   Pulse 69   Temp 98.2 F (36.8 C) (Oral)   Ht 5' 11"  (1.803 m)   Wt 91.8 kg (202 lb 6.1 oz)   SpO2 96%   BMI 28.23 kg/m   Physical Exam  Constitutional: He is oriented  to person, place, and time. He appears well-developed and well-nourished.  HENT:  Head: Normocephalic and atraumatic.  Eyes: Pupils are equal, round, and reactive to light.  Neck: Normal range of motion. Neck supple.  Cardiovascular: Normal rate, regular rhythm and normal heart sounds.   Pulmonary/Chest: Effort normal and breath sounds normal. No respiratory distress. He has no wheezes. He has no rales. He exhibits no tenderness.  Abdominal: Soft. Bowel sounds are normal. There is tenderness (Very mild tenderness to the epigastrium and right upper quadrant). There is no rebound and no guarding.   Musculoskeletal: Normal range of motion. He exhibits no edema.  Lymphadenopathy:    He has no cervical adenopathy.  Neurological: He is alert and oriented to person, place, and time.  Skin: Skin is warm and dry. No rash noted.  Psychiatric: He has a normal mood and affect.     ED Treatments / Results  Labs (all labs ordered are listed, but only abnormal results are displayed) Labs Reviewed  COMPREHENSIVE METABOLIC PANEL  LIPASE, BLOOD  CBC WITH DIFFERENTIAL/PLATELET  URINALYSIS, ROUTINE W REFLEX MICROSCOPIC  TROPONIN I    EKG  EKG Interpretation None       Radiology No results found.  Procedures Procedures (including critical care time)  Medications Ordered in ED Medications  famotidine (PEPCID) IVPB 20 mg premix (20 mg Intravenous New Bag/Given 11/19/16 1335)     Initial Impression / Assessment and Plan / ED Course  I have reviewed the triage vital signs and the nursing notes.  Pertinent labs & imaging results that were available during my care of the patient were reviewed by me and considered in my medical decision making (see chart for details).      PT is a 76 year old male who was discharged from [facility] yesterday for a CHF exacerbation area he had an echocardiogram at that point which showed an EF of 35-40% as well as moderate to severe tricuspid regurgitation. He came back today because he had an episode of epigastric pain during the night. He had a brief episode of shortness of breath but no ongoing shortness of breath. He currently denies any symptoms other than some mild epigastric and right upper quadrant tenderness. Ultrasound today shows some small gallstones but no evidence of cholecystitis. His LFTs are normal other than his alkaline phosphatase is a little bit elevated. His troponin is mildly elevated. I don't have an old EKG for comparison but I did review the EKG report from [facility] and it sounds to be similar to today's  EKG. He had a known right bundle branch block with left axis deviation. His QRS is similar to what it was during this recent hospitalization. I don't see any evidence of acute ischemia. He doesn't have any ongoing chest pain or shortness of breath. Chest x-ray shows no evidence of pulmonary edema although he has does have evidence of consolidation in the right chest is compared to his chest x-ray from August 5. I will start him on IV antibiotics and she will need to be admitted for serial troponins. Patient's daughter is adamant about him not going back to [facility] she states his primary care physician wanted him to be seen in the [facility] system. I will consult the hospitalist for transfer.  I spoke with Dr. Wyline Copas who has accepted the pt for transfer  Final Clinical Impressions(s) / ED Diagnoses   Final diagnoses:  RUQ pain    New Prescriptions New Prescriptions  No medications on file     Malvin Johns, MD 11/19/16 1531

## 2016-11-19 NOTE — H&P (Addendum)
History and Physical    Dillon Andrews ASN:[id number] DOB: 1940-10-30 DOA: 11/19/2016  PCP: [name], MD  Patient coming from: [facility]  I have personally briefly reviewed patient's old medical records in [place]  Chief Complaint: Epigastric pain, nausea  HPI: Dillon Andrews is a 76 y.o. male with medical history significant of Systolic CHF with EF 19-37% appears to be NICM with only minimal non-occlusive CAD on cath in June of this year; Esophageal varices due to presumed non alcoholic cirrhosis of liver followed by GI Dr. Marin Comment; DM2, HTN, HLD, CKD stage 3 with baseline creat of 1.5; NSVT started on Amiodarone in June after heart cath, Amiodarone stopped during hospital stay this past week due to epigastric discomfort and nausea.  Patient was just hospitalized at [facility] this past week with acute on chronic systolic CHF exacerbation.  His breathing improved significantly with diuresis.  During that hospital stay he was noted to have a RML consolidation and R pleural effusion.  No fevers, no SIRS, completed course of ABx, pro calcitonin was negative.  Ultimately this was felt to be due to asymmetric pulmonary edema.  Patient was discharged from hospital yesterday, family didn't want him to go back to [facility].  Last evening he developed epigastric and RUQ abdominal pain and tenderness.  This has been intermittent and ongoing for past several months.  Associated nausea, no vomiting.  Of note this onset prior to being started on the amiodarone that was stopped last hospital stay.  He has had mildly elevated LFTs for many years.  HIDA scan was done last week during hospital stay and was negative.   ED Course: [place] RUQ shows sludge in gallbladder but no acute cholecystitis.   Review of Systems: As per HPI otherwise 10 point review of systems negative.   Past Medical History:  Diagnosis Date  . Barrett's esophagus   . CHF (congestive heart failure) ([place])   . Diabetes mellitus  without complication ([place])   . Esophageal varices ([place])   . High cholesterol   . Hypertension   . Pneumonia     Past Surgical History:  Procedure Laterality Date  . BACK SURGERY    . CATARACT EXTRACTION    . HAND TENDON SURGERY    . KNEE SURGERY       reports that he has quit smoking. He has never used smokeless tobacco. He reports that he drinks alcohol. He reports that he does not use drugs.  No Known Allergies  Family History  Problem Relation Age of Onset  . Cirrhosis Neg Hx      Prior to Admission medications   Not on File    Physical Exam: Vitals:   11/19/16 1630 11/19/16 1700 11/19/16 1730 11/19/16 2042  BP: 121/72 120/69 120/70 123/79  Pulse: 64 66 65 63  Resp: 18 (!) 24 (!) 23 20  Temp:    98.6 F (37 C)  TempSrc:    Oral  SpO2: 90% 92% 92% 95%  Weight:      Height:        Constitutional: NAD, calm, comfortable Eyes: PERRL, lids and conjunctivae normal ENMT: Mucous membranes are moist. Posterior pharynx clear of any exudate or lesions.Normal dentition.  Neck: normal, supple, no masses, no thyromegaly Respiratory: clear to auscultation bilaterally, no wheezing, no crackles. Normal respiratory effort. No accessory muscle use.  Cardiovascular: Regular rate and rhythm, no murmurs / rubs / gallops. No extremity edema. 2+ pedal pulses. No carotid bruits.  Abdomen: no  tenderness, no masses palpated. No hepatosplenomegaly. Bowel sounds positive.  Musculoskeletal: no clubbing / cyanosis. No joint deformity upper and lower extremities. Good ROM, no contractures. Normal muscle tone.  Skin: no rashes, lesions, ulcers. No induration Neurologic: CN 2-12 grossly intact. Sensation intact, DTR normal. Strength 5/5 in all 4.  Psychiatric: Normal judgment and insight. Alert and oriented x 3. Normal mood.    Labs on Admission: I have personally reviewed following labs and imaging studies  CBC:  Recent Labs Lab 11/19/16 1326  WBC 10.0  NEUTROABS 7.9*  HGB 14.3    HCT 43.0  MCV 97.5  PLT 161   Basic Metabolic Panel:  Recent Labs Lab 11/19/16 1326  NA 141  K 3.8  CL 100*  CO2 30  GLUCOSE 101*  BUN 26*  CREATININE 1.69*  CALCIUM 8.6*   GFR: Estimated Creatinine Clearance: 42.4 mL/min (A) (by C-G formula based on SCr of 1.69 mg/dL (H)). Liver Function Tests:  Recent Labs Lab 11/19/16 1326  AST 64*  ALT 61  ALKPHOS 208*  BILITOT 1.2  PROT 6.4*  ALBUMIN 3.2*    Recent Labs Lab 11/19/16 1326  LIPASE 35   No results for input(s): AMMONIA in the last 168 hours. Coagulation Profile: No results for input(s): INR, PROTIME in the last 168 hours. Cardiac Enzymes:  Recent Labs Lab 11/19/16 1326  TROPONINI 0.23*   BNP (last 3 results) No results for input(s): PROBNP in the last 8760 hours. HbA1C: No results for input(s): HGBA1C in the last 72 hours. CBG: No results for input(s): GLUCAP in the last 168 hours. Lipid Profile: No results for input(s): CHOL, HDL, LDLCALC, TRIG, CHOLHDL, LDLDIRECT in the last 72 hours. Thyroid Function Tests: No results for input(s): TSH, T4TOTAL, FREET4, T3FREE, THYROIDAB in the last 72 hours. Anemia Panel: No results for input(s): VITAMINB12, FOLATE, FERRITIN, TIBC, IRON, RETICCTPCT in the last 72 hours. Urine analysis:    Component Value Date/Time   COLORURINE YELLOW 11/19/2016 1326   APPEARANCEUR CLEAR 11/19/2016 1326   LABSPEC 1.006 11/19/2016 1326   PHURINE 7.5 11/19/2016 1326   GLUCOSEU NEGATIVE 11/19/2016 1326   HGBUR NEGATIVE 11/19/2016 1326   BILIRUBINUR NEGATIVE 11/19/2016 1326   KETONESUR NEGATIVE 11/19/2016 1326   PROTEINUR NEGATIVE 11/19/2016 1326   UROBILINOGEN 1.0 06/06/2008 0949   NITRITE NEGATIVE 11/19/2016 1326   LEUKOCYTESUR NEGATIVE 11/19/2016 1326    Radiological Exams on Admission: Dg Chest 2 View  Result Date: 11/19/2016 CLINICAL DATA:  Chest pain and shortness of breath EXAM: CHEST  2 VIEW COMPARISON:  November 17, 2016 FINDINGS: There is airspace consolidation  in the right middle lobe consistent with pneumonia. There is a minimal right pleural effusion. Lungs elsewhere clear. Heart is borderline enlarged with pulmonary vascularity within normal limits. No adenopathy. There is aortic atherosclerosis. There is degenerative change in the thoracic spine. IMPRESSION: Consolidation in the right middle lobe, consistent with pneumonia. Small right pleural effusion. Left lung clear. Heart prominent with pulmonary vascularity within normal limits. There is aortic atherosclerosis. Aortic Atherosclerosis (ICD10-I70.0). Electronically Signed   By: Lowella Grip III M.D.   On: 11/19/2016 14:57   US Abdomen Limited Ruq  Result Date: 11/19/2016 CLINICAL DATA:  76 year old male with sharp epigastric pain yesterday which persisted for several hours. Known gallstones. EXAM: ULTRASOUND ABDOMEN LIMITED RIGHT UPPER QUADRANT COMPARISON:  [facility] ultrasound 11/10/2016. [facility] CT Abdomen and Pelvis 05/16/2005 FINDINGS: Gallbladder: Confluent echogenic material located dependent in the gallbladder fundus compatible with tumefactive sludge (image 13).  No vascularity on power Doppler (image 22). Suspect small superimposed gallstones measuring 2-3 mm (image 19). Gallbladder wall thickness remains normal. No pericholecystic fluid. No sonographic Murphy sign elicited. Common bile duct: Diameter: 3 mm , normal Liver: No focal lesion identified. Within normal limits in parenchymal echogenicity. Other findings: Visible right kidney is within normal limits. Right pleural effusion partially visible (image 56). IMPRESSION: 1. Dependent tumefactive sludge and small gallstones within the gallbladder. No evidence of acute cholecystitis or biliary obstruction. 2. Right pleural effusion. Electronically Signed   By: Genevie Ann M.D.   On: 11/19/2016 14:38    EKG: Independently reviewed.  Assessment/Plan Principal Problem:   Epigastric pain Active Problems:   CKD  (chronic kidney disease) stage 3, GFR 30-59 ml/min   HTN (hypertension)   DM2 (diabetes mellitus, type 2) (HCC)   Chronic systolic CHF (congestive heart failure) (HCC)   Pleural effusion on right   Elevated troponin   Esophageal varices in cirrhosis (H[facility])    1. Epigastric pain and nausea - 1. PRN zofran 2. Perhaps what ever is causing his cirrhosis (which isnt entirely clear to me from reading GIs notes) is causing this? 1. Will check hepatitis pnl to rule out chronic HBV HCV since I dont see where this was done (though I imagine it probably had already been done). 3. GI emptying study negative during last hospital stay.\ 4. HIDA negative during last hospital stay. 5. Consider GI eval in AM? 6. Not sure when his last upper EGD was, but with esophageal varices and Barrett's esophagus now in setting of abdominal pain I imagine that GI may feel this is indicated. 2. Elevated troponin - 1. Serial trops ordered 2. Doubt ACS with minimal non-occlusive CAD seen on cath less than 2 months ago. 3. Also had single elevated trop during hospital stay last week as well 3. RML consolidation and R pleural effusion - 1. Probably asymmetric edema (see HPI) 2. Hold off on further ABx unless he develops SIRS, or symptoms even 3. Will get CT non-contrast chest to see if this helps clarify what is going on 4. Chronic systolic CHF - 1. Continue home meds and diuretics 5. H/o NSVT - 1. Amiodarone stopped last hospital stay, though his abdominal symptoms onset before he was ever started on amiodarone 2. Will leave him off of this for now and defer decision to resume this med to cards on follow up, thanks to its very long half life. 3. Tele monitor 6. DM2 - Sensitive SSI AC 7. HTN - continue home meds 8. CKD stage 3 - baseline creat 1.5-1.6, appears stable  DVT prophylaxis: Heparin [place] Code Status: Full Family Communication: Daughter at bedside Disposition Plan: Home after admit Consults called:  None Admission status: Admit to inpatient   [place], [place] [facility] Pager [phone]  If 7AM-7PM, please contact day team taking care of patient [email or web address] Password [email or web address]  11/19/2016, 8:46 PM

## 2016-11-19 NOTE — ED Notes (Signed)
ED Provider at bedside. 

## 2016-11-19 NOTE — ED Notes (Signed)
Patient transported to X-ray 

## 2016-11-19 NOTE — Progress Notes (Signed)
CRITICAL VALUE ALERT  Critical Value:  Troponin 0.29  Date & Time Notied:  11/19/2016 2200  Provider Notified: Raliegh Ip Schorr  Orders Received/Actions taken: protocol,pt asymptomatic

## 2016-11-19 NOTE — ED Notes (Signed)
Attempted to call report, nurse not available.

## 2016-11-20 DIAGNOSIS — R1013 Epigastric pain: Secondary | ICD-10-CM | POA: Diagnosis not present

## 2016-11-20 DIAGNOSIS — E1122 Type 2 diabetes mellitus with diabetic chronic kidney disease: Secondary | ICD-10-CM | POA: Diagnosis not present

## 2016-11-20 DIAGNOSIS — R1011 Right upper quadrant pain: Secondary | ICD-10-CM | POA: Diagnosis not present

## 2016-11-20 DIAGNOSIS — R0602 Shortness of breath: Secondary | ICD-10-CM | POA: Diagnosis not present

## 2016-11-20 DIAGNOSIS — J90 Pleural effusion, not elsewhere classified: Secondary | ICD-10-CM | POA: Diagnosis not present

## 2016-11-20 DIAGNOSIS — I10 Essential (primary) hypertension: Secondary | ICD-10-CM | POA: Diagnosis not present

## 2016-11-20 DIAGNOSIS — I5022 Chronic systolic (congestive) heart failure: Secondary | ICD-10-CM | POA: Diagnosis not present

## 2016-11-20 DIAGNOSIS — N183 Chronic kidney disease, stage 3 (moderate): Secondary | ICD-10-CM | POA: Diagnosis not present

## 2016-11-20 DIAGNOSIS — R748 Abnormal levels of other serum enzymes: Secondary | ICD-10-CM | POA: Diagnosis not present

## 2016-11-20 DIAGNOSIS — K819 Cholecystitis, unspecified: Secondary | ICD-10-CM | POA: Diagnosis not present

## 2016-11-20 DIAGNOSIS — J189 Pneumonia, unspecified organism: Secondary | ICD-10-CM | POA: Diagnosis not present

## 2016-11-20 DIAGNOSIS — J181 Lobar pneumonia, unspecified organism: Secondary | ICD-10-CM | POA: Diagnosis not present

## 2016-11-20 LAB — CBC
HEMATOCRIT: 41.5 % (ref 39.0–52.0)
Hemoglobin: 13.4 g/dL (ref 13.0–17.0)
MCH: 31.2 pg (ref 26.0–34.0)
MCHC: 32.3 g/dL (ref 30.0–36.0)
MCV: 96.7 fL (ref 78.0–100.0)
PLATELETS: 166 10*3/uL (ref 150–400)
RBC: 4.29 MIL/uL (ref 4.22–5.81)
RDW: 15.2 % (ref 11.5–15.5)
WBC: 7.9 10*3/uL (ref 4.0–10.5)

## 2016-11-20 LAB — GLUCOSE, CAPILLARY
GLUCOSE-CAPILLARY: 136 mg/dL — AB (ref 65–99)
GLUCOSE-CAPILLARY: 185 mg/dL — AB (ref 65–99)
Glucose-Capillary: 109 mg/dL — ABNORMAL HIGH (ref 65–99)
Glucose-Capillary: 125 mg/dL — ABNORMAL HIGH (ref 65–99)

## 2016-11-20 LAB — COMPREHENSIVE METABOLIC PANEL
ALK PHOS: 194 U/L — AB (ref 38–126)
ALT: 50 U/L (ref 17–63)
ANION GAP: 10 (ref 5–15)
AST: 52 U/L — ABNORMAL HIGH (ref 15–41)
Albumin: 2.7 g/dL — ABNORMAL LOW (ref 3.5–5.0)
BUN: 21 mg/dL — ABNORMAL HIGH (ref 6–20)
CO2: 29 mmol/L (ref 22–32)
CREATININE: 1.58 mg/dL — AB (ref 0.61–1.24)
Calcium: 8.4 mg/dL — ABNORMAL LOW (ref 8.9–10.3)
Chloride: 104 mmol/L (ref 101–111)
GFR, EST AFRICAN AMERICAN: 47 mL/min — AB (ref >60)
GFR, EST NON AFRICAN AMERICAN: 41 mL/min — AB (ref >60)
Glucose, Bld: 121 mg/dL — ABNORMAL HIGH (ref 65–99)
Potassium: 3.9 mmol/L (ref 3.5–5.1)
Sodium: 143 mmol/L (ref 135–145)
TOTAL PROTEIN: 5.4 g/dL — AB (ref 6.5–8.1)
Total Bilirubin: 1.1 mg/dL (ref 0.3–1.2)

## 2016-11-20 LAB — TROPONIN I
TROPONIN I: 0.31 ng/mL — AB (ref <0.03)
Troponin I: 0.31 ng/mL (ref <0.03)

## 2016-11-20 MED ORDER — GI COCKTAIL ~~LOC~~
30.0000 mL | Freq: Three times a day (TID) | ORAL | Status: DC | PRN
  Administered 2016-11-20 – 2016-11-26 (×3): 30 mL via ORAL
  Filled 2016-11-20 (×3): qty 30

## 2016-11-20 NOTE — Progress Notes (Signed)
[facility]  PROGRESS NOTE  Dillon Andrews LEX:[id number] DOB: 08/17/1940 DOA: 11/19/2016 PCP: No primary care provider on file.   Brief HPI:   76 y.o. male with medical history significant of Systolic CHF with EF 44-96% appears to be NICM with only minimal non-occlusive CAD on cath in June of this year; Esophageal varices due to presumed non alcoholic cirrhosis of liver followed by GI Dr. Marin Comment; DM2, HTN, HLD, CKD stage 3 with baseline creat of 1.5; NSVT started on Amiodarone in June after heart cath, Amiodarone stopped during hospital stay this past week due to epigastric discomfort and nausea.  Patient was just hospitalized at [facility] this past week with acute on chronic systolic CHF exacerbation.  His breathing improved significantly with diuresis.  During that hospital stay he was noted to have a RML consolidation and R pleural effusion.  No fevers, no SIRS, completed course of ABx, pro calcitonin was negative.  Ultimately this was felt to be due to asymmetric pulmonary edema.  Patient was discharged from hospital yesterday, family didn't want him to go back to [facility].  Last evening he developed epigastric and RUQ abdominal pain and tenderness.  This has been intermittent and ongoing for past several months.  Associated nausea, no vomiting.  Subjective   Today patient denies chest pain. Complains of mild nausea, epigastric pain.   Assessment/Plan:     1. Nausea/epigastric pain- patient had negative GI emptying study during previous hospital stay, HIDA scan also was negative. Abdominal ultrasound shows cholelithiasis, patient does have history of elevated liver enzymes .We will consult GI for further evaluation and management. 2. Elevated troponin- patient has mild elevation of troponin, cardiac cath in June showed minimal nonocclusive CAD. He denies chest pain. [facility] consult cardiology  for further recommendations. 3. Right middle lobe consolidation/right pleural effusion- CT chest showed  atelectasis versus pneumonia, minimal effusion. Status post thoracentesis at [facility] hospital. No antibiotics were initiated by admitting physician, I agree that patient does not appear to be having pneumonia at this time. 4. Chronic systolic CHF- stable, euvolemic. Continue furosemide 40 mg by mouth twice a day. 5. History of NSVT- amiodarone was  stopped due to intolerance during last hospital stay at [facility] hospital. 6. Diabetes mellitus- continue sliding scale insulin. 7. Hypertension-blood pressure is stable, continue metoprolol 8. CK D stage III- creatinine at  Baseline 1.58.    DVT prophylaxis: Heparin  Code Status: Full code  Family Communication: discussed with patient's daughter at bedside   Disposition Plan: Likely home in 1-2 days   Consultants:  None  Procedures:  None  Continuous infusions     Antibiotics:   Anti-infectives    Start     Dose/Rate Route Frequency Ordered Stop   11/19/16 1515  ceFEPIme (MAXIPIME) 1 g in dextrose 5 % 50 mL IVPB     1 g 100 mL/hr over 30 Minutes Intravenous  Once 11/19/16 1503 11/19/16 1609   11/19/16 1515  vancomycin (VANCOCIN) IVPB 1000 mg/200 mL premix     1,000 mg 200 mL/hr over 60 Minutes Intravenous  Once 11/19/16 1503 11/19/16 1752   11/19/16 1511  ceFEPIme (MAXIPIME) 1 g injection    Comments:  [name]   : cabinet override      11/19/16 1511 11/20/16 0314       Objective   Vitals:   11/19/16 2042 11/20/16 0604 11/20/16 0903 11/20/16 1236  BP: 123/79 (!) 134/96 124/79 130/81  Pulse: 63 65 71 72  Resp:  20 20 20    Temp: 98.6 F (37 C) 98 F (36.7 C) 98.1 F (36.7 C) 98.1 F (36.7 C)  TempSrc: Oral Oral Oral Oral  SpO2: 95% 95% 96% 94%  Weight:  89.4 kg (197 lb 1.6 oz)    Height:        Intake/Output Summary (Last 24 hours) at 11/20/16 1831 Last data filed at 11/20/16 1815  Gross per 24 hour  Intake              360 ml  Output              850 ml  Net             -490  ml   Filed Weights   11/19/16 1213 11/20/16 0604  Weight: 91.8 kg (202 lb 6.1 oz) 89.4 kg (197 lb 1.6 oz)     Physical Examination:   Physical Exam: Eyes: No icterus, extraocular muscles intact  Mouth: Oral mucosa is moist, no lesions on palate,  Neck: Supple, no deformities, masses, or tenderness Lungs: Normal respiratory effort, bilateral clear to auscultation, no crackles or wheezes.  Heart: Regular rate and rhythm, S1 and S2 normal, no murmurs, rubs auscultated Abdomen: BS normoactive,soft,nondistended,non-tender to palpation,no organomegaly Extremities: No pretibial edema, no erythema, no cyanosis, no clubbing Neuro : Alert and oriented to time, place and person, No focal deficits Skin: No rashes seen on exam    Data Reviewed: I have personally reviewed following labs and imaging studies  CBG:  Recent Labs Lab 11/19/16 2152 11/20/16 0751 11/20/16 1233 11/20/16 1646  GLUCAP 122* 109* 125* 136*    CBC:  Recent Labs Lab 11/19/16 1326 11/20/16 0114  WBC 10.0 7.9  NEUTROABS 7.9*  --   HGB 14.3 13.4  HCT 43.0 41.5  MCV 97.5 96.7  PLT 180 161    Basic Metabolic Panel:  Recent Labs Lab 11/19/16 1326 11/20/16 0114  NA 141 143  K 3.8 3.9  CL 100* 104  CO2 30 29  GLUCOSE 101* 121*  BUN 26* 21*  CREATININE 1.69* 1.58*  CALCIUM 8.6* 8.4*    No results found for this or any previous visit (from the past 240 hour(s)).   Liver Function Tests:  Recent Labs Lab 11/19/16 1326 11/20/16 0114  AST 64* 52*  ALT 61 50  ALKPHOS 208* 194*  BILITOT 1.2 1.1  PROT 6.4* 5.4*  ALBUMIN 3.2* 2.7*    Recent Labs Lab 11/19/16 1326  LIPASE 35   No results for input(s): AMMONIA in the last 168 hours.  Cardiac Enzymes:  Recent Labs Lab 11/19/16 1326 11/19/16 2057 11/20/16 0114 11/20/16 0738  TROPONINI 0.23* 0.29* 0.31* 0.31*   BNP (last 3 results)  Recent Labs  11/19/16 1326  BNP 439.5*    ProBNP (last 3 results) No results for input(s):  PROBNP in the last 8760 hours.    Studies: Dg Chest 2 View  Result Date: 11/19/2016 CLINICAL DATA:  Chest pain and shortness of breath EXAM: CHEST  2 VIEW COMPARISON:  November 17, 2016 FINDINGS: There is airspace consolidation in the right middle lobe consistent with pneumonia. There is a minimal right pleural effusion. Lungs elsewhere clear. Heart is borderline enlarged with pulmonary vascularity within normal limits. No adenopathy. There is aortic atherosclerosis. There is degenerative change in the thoracic spine. IMPRESSION: Consolidation in the right middle lobe, consistent with pneumonia. Small right pleural effusion. Left lung clear. Heart prominent with pulmonary vascularity within normal limits. There is aortic atherosclerosis. Aortic  Atherosclerosis (ICD10-I70.0). Electronically Signed   By: Lowella Grip III M.D.   On: 11/19/2016 14:57   Ct Chest Wo Contrast  Result Date: 11/20/2016 CLINICAL DATA:  Acute onset of generalized chest pain and shortness of breath. Initial encounter. EXAM: CT CHEST WITHOUT CONTRAST TECHNIQUE: Multidetector CT imaging of the chest was performed following the standard protocol without IV contrast. COMPARISON:  Chest radiograph performed earlier today at 3:02 p.m., and CTA of the chest performed 10/03/2016 FINDINGS: Cardiovascular: The heart is borderline normal in size. Mild calcification is noted at the aortic arch. Scattered coronary artery calcifications are seen. The great vessels are grossly unremarkable in appearance. Mediastinum/Nodes: A mildly enlarged aortopulmonary window node is seen, measuring 1.2 cm in short axis. No pericardial effusion is identified. The thyroid gland is unremarkable. No axillary lymphadenopathy is seen. Lungs/Pleura: A small right pleural effusion is noted. Partial consolidation of the right lower lobe may reflect atelectasis or possibly mild infection. Mild left basilar atelectasis is noted. No pneumothorax is seen. No dominant mass  is identified. Upper Abdomen: The visualized portions of the liver and spleen are unremarkable. Vague soft tissue inflammation is noted about the gallbladder, raising concern for cholecystitis. Stones are noted within the gallbladder. The visualized portions of the pancreas and adrenal glands are within normal limits. A right renal cyst is noted. Musculoskeletal: No acute osseous abnormalities are identified. Anterior bridging osteophytes are seen along the thoracic spine. The visualized musculature is unremarkable in appearance. IMPRESSION: 1. Small right pleural effusions. Partial consolidation of the right lower lung lobe may reflect atelectasis or possibly pneumonia. Mild left basilar atelectasis noted. 2. Vague soft tissue inflammation at the bladder raises question for cholecystitis. Would correlate for any associated symptoms. Cholelithiasis noted. 3. Mildly enlarged aortopulmonary window node, measuring 1.2 cm in short axis. 4. Scattered coronary artery calcifications seen. 5. Right renal cyst noted. Electronically Signed   By: Garald Balding M.D.   On: 11/20/2016 02:59   US Abdomen Limited Ruq  Result Date: 11/19/2016 CLINICAL DATA:  76 year old male with sharp epigastric pain yesterday which persisted for several hours. Known gallstones. EXAM: ULTRASOUND ABDOMEN LIMITED RIGHT UPPER QUADRANT COMPARISON:  [facility] ultrasound 11/10/2016. [facility] CT Abdomen and Pelvis 05/16/2005 FINDINGS: Gallbladder: Confluent echogenic material located dependent in the gallbladder fundus compatible with tumefactive sludge (image 13). No vascularity on power Doppler (image 22). Suspect small superimposed gallstones measuring 2-3 mm (image 19). Gallbladder wall thickness remains normal. No pericholecystic fluid. No sonographic Murphy sign elicited. Common bile duct: Diameter: 3 mm , normal Liver: No focal lesion identified. Within normal limits in parenchymal echogenicity. Other findings:  Visible right kidney is within normal limits. Right pleural effusion partially visible (image 56). IMPRESSION: 1. Dependent tumefactive sludge and small gallstones within the gallbladder. No evidence of acute cholecystitis or biliary obstruction. 2. Right pleural effusion. Electronically Signed   By: Genevie Ann M.D.   On: 11/19/2016 14:38    Scheduled Meds: . aspirin  81 mg Oral Daily  . furosemide  40 mg Oral BID  . gabapentin  100 mg Oral QHS  . heparin subcutaneous  5,000 Units Subcutaneous Q8H  . insulin aspart  0-9 Units Subcutaneous TID WC  . metoprolol succinate  25 mg Oral Daily  . pantoprazole  80 mg Oral Daily  . potassium chloride  20 mEq Oral Daily  . rosuvastatin  10 mg Oral Daily      Time spent: 25 min  [facility]  Pager [phone]. If 7PM-7AM, please contact night-coverage at [email or web address], Office  [phone]  password Horace  11/20/2016, 6:31 PM  LOS: 1 day

## 2016-11-20 NOTE — Consult Note (Signed)
CARDIOLOGY CONSULT NOTE     Patient ID: Dillon Andrews MRN: [id number] DOB/AGE: [birth year] 77 y.o.  Admit date: 11/19/2016 Referring Physician Jennette Kettle MD Primary Physician Abelino Derrick MD Primary Cardiologist Milana Huntsman MD [place] Reason for Consultation elevated troponin  HPI: 76 yo WM with history of chronic systolic CHF, nonischemic cardiomyopathy, EF 40-45%by cath, 35-40% by Echo, NASH with esophageal varices, HTN, DM type 2, HLD, and CKD stage 3 presents for evaluation of acute epigastric pain. He also has a history of NSVT and was on amiodarone until this was stopped due to intolerance. He was hospitalized in June at [facility] with acute CHF. Underwent cardiac cath at that time showing normal coronary arteries. EF found to be reduced at 40-45%. He was treated with IV diuresis and has thoracentesis of approx. 900 cc on right. Reports he was in hospital for 8 days.  Patient presented to primary care on 11/19/16 with complaints of nausea and epigastric pain. Denies to me any SOB. Reports weight has been stable. Does have some DOE since he left the hospital but much better than before. No increased edema. Denies any chest pain. Pain all epigastric. On this admission noted to have mildly elevated troponin.  Past Medical History:  Diagnosis Date  . Barrett's esophagus   . CHF (congestive heart failure) ([place])   . Diabetes mellitus without complication ([place])   . Esophageal varices ([place])   . High cholesterol   . Hypertension   . Pneumonia     Family History  Problem Relation Age of Onset  . Cirrhosis Neg Hx     Social History   Social History  . Marital status: Married    Spouse name: N/A  . Number of children: N/A  . Years of education: N/A   Occupational History  . Not on file.   Social History Main Topics  . Smoking status: Former Research scientist (life sciences)  . Smokeless tobacco: Never Used  . Alcohol use Yes     Comment: occ  . Drug use: No  . Sexual activity: Not on file     Other Topics Concern  . Not on file   Social History Narrative  . No narrative on file    Past Surgical History:  Procedure Laterality Date  . BACK SURGERY    . CATARACT EXTRACTION    . HAND TENDON SURGERY    . KNEE SURGERY    . LEFT HEART CATH  09/2016   Minimal, non-occlusive CAD, EF 45%     Prescriptions Prior to Admission  Medication Sig Dispense Refill Last Dose  . aspirin EC 81 MG tablet Take 81 mg by mouth daily.   11/18/2016  . furosemide (LASIX) 40 MG tablet Take 40 mg by mouth 2 (two) times daily.   11/18/2016  . gabapentin (NEURONTIN) 100 MG capsule Take 100 mg by mouth at bedtime.   11/18/2016  . glipiZIDE (GLUCOTROL) 5 MG tablet Take 2.5 mg by mouth 2 (two) times daily.  0 11/18/2016  . metoprolol succinate (TOPROL-XL) 25 MG 24 hr tablet Take 25 mg by mouth daily. HR<55/min or SBP <100 mm hg   11/18/2016  . nitroGLYCERIN (NITROSTAT) 0.4 MG SL tablet Place 0.4 mg under the tongue.   PRN  . omeprazole (PRILOSEC) 40 MG capsule Take 40 mg by mouth daily.   11/18/2016  . potassium chloride SA (K-DUR,KLOR-CON) 20 MEQ tablet Take 20 mEq by mouth daily as needed. When taking lasix   11/18/2016  . repaglinide (PRANDIN) 0.5  MG tablet Take 0.5 mg by mouth 3 (three) times daily.   11/18/2016  . vitamin B-12 (CYANOCOBALAMIN) 500 MCG tablet Take 500 mcg by mouth every other day.   11/18/2016  . zolpidem (AMBIEN) 5 MG tablet Take 5 mg by mouth at bedtime as needed for sleep.   11/18/2016     ROS: As noted in HPI. All other systems are reviewed and are negative unless otherwise mentioned.   Physical Exam: Blood pressure 130/81, pulse 72, temperature 98.1 F (36.7 C), temperature source Oral, resp. rate 20, height 5' 11"  (1.803 m), weight 197 lb 1.6 oz (89.4 kg), SpO2 94 %. Current Weight  11/20/16 197 lb 1.6 oz (89.4 kg)    GENERAL:  Well appearing WM in NAD HEENT:  PERRL, EOMI, sclera are clear. Oropharynx is clear. NECK:  No jugular venous distention, carotid upstroke brisk and symmetric, no  bruits, no thyromegaly or adenopathy LUNGS:  Diminished BS in right base otherwise clear. CHEST:  Unremarkable HEART:  RRR,  PMI not displaced or sustained,S1 and S2 within normal limits, no S3, no S4: no clicks, no rubs, no murmurs ABD:  Soft, nontender. BS +, no masses or bruits. No hepatomegaly, no splenomegaly EXT:  2 + pulses throughout,  no cyanosis no clubbing. Trace edema R>L.  SKIN:  Warm and dry.  No rashes NEURO:  Alert and oriented x 3. Cranial nerves II through XII intact. PSYCH:  Cognitively intact    Labs:   Lab Results  Component Value Date   WBC 7.9 11/20/2016   HGB 13.4 11/20/2016   HCT 41.5 11/20/2016   MCV 96.7 11/20/2016   PLT 166 11/20/2016    Recent Labs Lab 11/20/16 0114  NA 143  K 3.9  CL 104  CO2 29  BUN 21*  CREATININE 1.58*  CALCIUM 8.4*  PROT 5.4*  BILITOT 1.1  ALKPHOS 194*  ALT 50  AST 52*  GLUCOSE 121*   Lab Results  Component Value Date   TROPONINI 0.31 ([place]) 11/20/2016   TROPONINI 0.31 ([facility]) 11/20/2016   TROPONINI 0.29 ([place]) 11/19/2016   No results found for: CHOL No results found for: HDL No results found for: LDLCALC No results found for: TRIG No results found for: CHOLHDL No results found for: LDLDIRECT  No results found for: PROBNP No results found for: TSH No results found for: HGBA1C  Radiology: Dg Chest 2 View  Result Date: 11/19/2016 CLINICAL DATA:  Chest pain and shortness of breath EXAM: CHEST  2 VIEW COMPARISON:  November 17, 2016 FINDINGS: There is airspace consolidation in the right middle lobe consistent with pneumonia. There is a minimal right pleural effusion. Lungs elsewhere clear. Heart is borderline enlarged with pulmonary vascularity within normal limits. No adenopathy. There is aortic atherosclerosis. There is degenerative change in the thoracic spine. IMPRESSION: Consolidation in the right middle lobe, consistent with pneumonia. Small right pleural effusion. Left lung clear. Heart prominent with pulmonary  vascularity within normal limits. There is aortic atherosclerosis. Aortic Atherosclerosis (ICD10-I70.0). Electronically Signed   By: Lowella Grip III M.D.   On: 11/19/2016 14:57   Ct Chest Wo Contrast  Result Date: 11/20/2016 CLINICAL DATA:  Acute onset of generalized chest pain and shortness of breath. Initial encounter. EXAM: CT CHEST WITHOUT CONTRAST TECHNIQUE: Multidetector CT imaging of the chest was performed following the standard protocol without IV contrast. COMPARISON:  Chest radiograph performed earlier today at 3:02 p.m., and CTA of the chest performed 10/03/2016 FINDINGS: Cardiovascular: The heart is borderline normal in  size. Mild calcification is noted at the aortic arch. Scattered coronary artery calcifications are seen. The great vessels are grossly unremarkable in appearance. Mediastinum/Nodes: A mildly enlarged aortopulmonary window node is seen, measuring 1.2 cm in short axis. No pericardial effusion is identified. The thyroid gland is unremarkable. No axillary lymphadenopathy is seen. Lungs/Pleura: A small right pleural effusion is noted. Partial consolidation of the right lower lobe may reflect atelectasis or possibly mild infection. Mild left basilar atelectasis is noted. No pneumothorax is seen. No dominant mass is identified. Upper Abdomen: The visualized portions of the liver and spleen are unremarkable. Vague soft tissue inflammation is noted about the gallbladder, raising concern for cholecystitis. Stones are noted within the gallbladder. The visualized portions of the pancreas and adrenal glands are within normal limits. A right renal cyst is noted. Musculoskeletal: No acute osseous abnormalities are identified. Anterior bridging osteophytes are seen along the thoracic spine. The visualized musculature is unremarkable in appearance. IMPRESSION: 1. Small right pleural effusions. Partial consolidation of the right lower lung lobe may reflect atelectasis or possibly pneumonia. Mild  left basilar atelectasis noted. 2. Vague soft tissue inflammation at the bladder raises question for cholecystitis. Would correlate for any associated symptoms. Cholelithiasis noted. 3. Mildly enlarged aortopulmonary window node, measuring 1.2 cm in short axis. 4. Scattered coronary artery calcifications seen. 5. Right renal cyst noted. Electronically Signed   By: Garald Balding M.D.   On: 11/20/2016 02:59   US Abdomen Limited Ruq  Result Date: 11/19/2016 CLINICAL DATA:  75 year old male with sharp epigastric pain yesterday which persisted for several hours. Known gallstones. EXAM: ULTRASOUND ABDOMEN LIMITED RIGHT UPPER QUADRANT COMPARISON:  [facility] ultrasound 11/10/2016. [facility] CT Abdomen and Pelvis 05/16/2005 FINDINGS: Gallbladder: Confluent echogenic material located dependent in the gallbladder fundus compatible with tumefactive sludge (image 13). No vascularity on power Doppler (image 22). Suspect small superimposed gallstones measuring 2-3 mm (image 19). Gallbladder wall thickness remains normal. No pericholecystic fluid. No sonographic Murphy sign elicited. Common bile duct: Diameter: 3 mm , normal Liver: No focal lesion identified. Within normal limits in parenchymal echogenicity. Other findings: Visible right kidney is within normal limits. Right pleural effusion partially visible (image 56). IMPRESSION: 1. Dependent tumefactive sludge and small gallstones within the gallbladder. No evidence of acute cholecystitis or biliary obstruction. 2. Right pleural effusion. Electronically Signed   By: Genevie Ann M.D.   On: 11/19/2016 14:38    EKG: NSR with first degree AV block. RBBB, LAFB, ? Old inferior and septal infarcts. No old Ecg for comparison. I have personally reviewed and interpreted this study.  Echo 11/08/16: Findings Mitral Valve Structurally normal mitral valve. Mild mitral regurgitation. Aortic Valve Structurally normal aortic valve with good leaflet  mobility, and no regurgitation. The aortic valve appears to be trileaflet. Tricuspid Valve Tricuspid valve is structurally normal. Moderate/Severe tricuspid regurgitation. Pulmonic Valve The pulmonic valve was not well visualized Left Atrium Normal size left atrium. Left Ventricle Ejection fraction is visually estimated at 35-40% Moderate global LV hypokinesis. Moderate concentric left ventricular hypertrophy Right Atrium Mild Right atrial enlargement Right Ventricle Normal right ventricular size and function. Pericardial Effusion No evidence of pericardial effusion. Miscellaneous The aorta is within normal limits.  Cardiac cath 09/30/16:  Angiographic findings  Cardiac Arteries and Lesion Findings LMCA: 0%. LAD: 0%. LCx: Lesion on Mid CX: 30% stenosis. RCA: 0%. Procedure Data Procedure Date Date: 09/30/2016 Start: 13:10 Contrast Material - Omnipaque62 ml Fluoroscopy Time: Diagnostic: 4:07 minutes. Total: 4:07 minutes. Admission Data Admission  Date: 09/27/2016  Coronary Tree  Dominance: Right VA LV function assessed VC:BSWHQP. Ejection Fraction - Method: LV gram. EF%: 40.  Hemodynamics  Condition: Rest  Heart Rate: 71 bpm  ASSESSMENT AND PLAN:  1. Elevated troponin. Unclear significance but not consistent with ACS. Normal coronary anatomy 2 months ago. Troponin minimally elevated with flat trend in setting of CKD. No significant anginal symptoms. No further evaluation or treatment recommended. 2. Chronic systolic CHF. Nonischemic CM with EF 35-40%. On guideline directed therapy with beta blocker and diuretics. Would not titrate beta blocker further given HR low 60s. Volume status looks good. Weight stable. Would hold off on ACEi/ARB/aldactone given CKD. Reviewed recommendations for low sodium diet and fluid restriction.  3. Epigastric pain of unclear etiology. Extensive GI work up has been completed.  4. Right pleural effusion- improved post tap. Not enough by  [place] or CT to warrant repeat thoracentesis.  5. History of NSVT noted when hypokalemic. Was on amiodarone but discontinued due to intolerance. Prior event monitor unremarkable.  6. HLD on crestor.  Plan: at this point would continue current therapy. Patient has follow up with his Cardiologist in [facility] Dr. Clydie Braun. I would recommend he follow up there. Please call with further questions.   Signed: Nora Sabey [place], [place]  11/20/2016, 5:27 PM

## 2016-11-21 ENCOUNTER — Encounter (HOSPITAL_COMMUNITY): Payer: Self-pay | Admitting: Physician Assistant

## 2016-11-21 DIAGNOSIS — R7989 Other specified abnormal findings of blood chemistry: Secondary | ICD-10-CM | POA: Diagnosis not present

## 2016-11-21 DIAGNOSIS — R1013 Epigastric pain: Secondary | ICD-10-CM | POA: Diagnosis not present

## 2016-11-21 DIAGNOSIS — I5022 Chronic systolic (congestive) heart failure: Secondary | ICD-10-CM | POA: Diagnosis not present

## 2016-11-21 DIAGNOSIS — E1122 Type 2 diabetes mellitus with diabetic chronic kidney disease: Secondary | ICD-10-CM | POA: Diagnosis not present

## 2016-11-21 DIAGNOSIS — J189 Pneumonia, unspecified organism: Secondary | ICD-10-CM | POA: Diagnosis present

## 2016-11-21 DIAGNOSIS — N183 Chronic kidney disease, stage 3 (moderate): Secondary | ICD-10-CM | POA: Diagnosis not present

## 2016-11-21 LAB — GLUCOSE, CAPILLARY
GLUCOSE-CAPILLARY: 138 mg/dL — AB (ref 65–99)
GLUCOSE-CAPILLARY: 135 mg/dL — AB (ref 65–99)
Glucose-Capillary: 161 mg/dL — ABNORMAL HIGH (ref 65–99)
Glucose-Capillary: 136 mg/dL — ABNORMAL HIGH (ref 65–99)

## 2016-11-21 LAB — CBC
HCT: 42.6 % (ref 39.0–52.0)
Hemoglobin: 14 g/dL (ref 13.0–17.0)
MCH: 32 pg (ref 26.0–34.0)
MCHC: 32.9 g/dL (ref 30.0–36.0)
MCV: 97.3 fL (ref 78.0–100.0)
PLATELETS: 175 10*3/uL (ref 150–400)
RBC: 4.38 MIL/uL (ref 4.22–5.81)
RDW: 15.2 % (ref 11.5–15.5)
WBC: 11.1 10*3/uL — AB (ref 4.0–10.5)

## 2016-11-21 LAB — COMPREHENSIVE METABOLIC PANEL
ALT: 56 U/L (ref 17–63)
AST: 57 U/L — AB (ref 15–41)
Albumin: 3.1 g/dL — ABNORMAL LOW (ref 3.5–5.0)
Alkaline Phosphatase: 217 U/L — ABNORMAL HIGH (ref 38–126)
Anion gap: 13 (ref 5–15)
BILIRUBIN TOTAL: 1.4 mg/dL — AB (ref 0.3–1.2)
BUN: 21 mg/dL — AB (ref 6–20)
CO2: 29 mmol/L (ref 22–32)
CREATININE: 1.56 mg/dL — AB (ref 0.61–1.24)
Calcium: 8.7 mg/dL — ABNORMAL LOW (ref 8.9–10.3)
Chloride: 100 mmol/L — ABNORMAL LOW (ref 101–111)
GFR, EST AFRICAN AMERICAN: 48 mL/min — AB (ref >60)
GFR, EST NON AFRICAN AMERICAN: 41 mL/min — AB (ref >60)
Glucose, Bld: 133 mg/dL — ABNORMAL HIGH (ref 65–99)
Potassium: 3.7 mmol/L (ref 3.5–5.1)
Sodium: 142 mmol/L (ref 135–145)
TOTAL PROTEIN: 6.1 g/dL — AB (ref 6.5–8.1)

## 2016-11-21 LAB — HEPATITIS PANEL, ACUTE
HCV Ab: <0.1 s/co ratio (ref 0.0–0.9)
HEP B S AG: NEGATIVE
Hep A IgM: NEGATIVE
Hep B C IgM: NEGATIVE

## 2016-11-21 MED ORDER — HYDROMORPHONE HCL 1 MG/ML IJ SOLN
1.0000 mg | INTRAMUSCULAR | Status: DC | PRN
  Administered 2016-11-21 – 2016-11-22 (×3): 1 mg via INTRAVENOUS
  Filled 2016-11-21 (×3): qty 1

## 2016-11-21 MED ORDER — CHLORHEXIDINE GLUCONATE CLOTH 2 % EX PADS: 6.0000 | MEDICATED_PAD | Freq: Once | CUTANEOUS | Status: AC

## 2016-11-21 MED ORDER — CHLORHEXIDINE GLUCONATE CLOTH 2 % EX PADS
6.0000 | MEDICATED_PAD | Freq: Once | CUTANEOUS | Status: AC
  Administered 2016-11-21: 6 via TOPICAL

## 2016-11-21 MED ORDER — CEFTRIAXONE SODIUM 2 G IJ SOLR
2.0000 g | INTRAMUSCULAR | Status: DC
  Administered 2016-11-21: 2 g via INTRAVENOUS
  Filled 2016-11-21 (×2): qty 2

## 2016-11-21 NOTE — Consult Note (Addendum)
[facility] Consult: 11:05 AM 11/21/2016  LOS: 2 days    Referring Provider: Dr Darrick Meigs  Primary Care Physician:  No primary care provider on file. Primary Gastroenterologist:  Dr. Pollyann Savoy at [facility], previously Dr Valli Glance   Reason for Consultation:  Nausea and epigastric pain    HPI: Dillon Andrews is a 76 y.o. male.  PMH CHF, EF 35 to 40%, moderate to severe tricuspid regurge on 11/08/16 Echo. Type 2 DM, not on insulin as outpt. HTN.  Restless leg syndrome.  CKD stage 3.     Hx NASH cirrhosis.   GERD, duodenitis (2009) and Barrett's esophagus (several previous biopsies negative for classic Barrett's but + for Barret's in 05/2010, 03/2012. Hyperplastic gastric polyp 03/2012, 03/2013.  Benign duodenal polyp 03/2012. Cholelithiasis dates to at least 2016.  Colon polyps, TVA and tubular adenoma in 2011, hyperplastic in 2002, 2006   S/p 2 lumbar spine surgeries.    Previous EGDs "every year" form many years but now on loosened surveillance protocol and last 07/2015 EGD.  Only path report, not procedure report found.  Gastric polyp (hyperplastic), bulbar nodule (gastric heterotopia), mild chronic inflammation but no Barretts on bx of GE Jx, no H Pylori. Pt mentions vague recall of abnormal veins but was told he would not need as frequent EGDs in future as in past.   03/2015 Colonoscopy, path report only: left sided tubular adenoma.  Admissions for several days in 09/2016 and again in late 10/2016 to [facility] with CHF, NSVT.  Just discharged on 8/6. S/p cardiac cath 09/30/16: mild non-obstructive CAD and mild global LV dysfunction, LVEF of 40-45%.  Right heart with unremarkable right heart pressure except peak "V" wave noted and a wedge of 19. 10/01/16 TEE showed moderate MR and moderate TR.  6/22 right  thoracentesis to address  pleural effusion.  Iron level low at 28 but Hgb of 14.3.    Has had at least 2 months of nausea, but rare if any emesis.  These predate his starting on new cardiac meds, including Amiodarone, in June.  Has taken 40 mg Omeprozole daily for many years LFTs of 7/25 - 7/27 show t bili 1.4, alk phos 62, AST/ALT 49/62.   Also has had intermittent epigastric pain, separate from SS CP of which he also complains.  11/10/16 abd ultrasound: right pleural effusion, small peri-hepatic ascites, uncomplicated cholelithiasis.   11/11/16 HIDA:  Patent CBD, negative study.   8/1 Gastric emptying study: well WNL study.    [facility]7 after limited episode of epigastric pain on Monday night and further SOB along with chest pain.   Chest x rays: RML consolidation c/w PNA, small right pleural effusion.   Ultrasound abd showed dependent tumefactive sludge and small gallstones within the gallbladder. No evidence of acute cholecystitis or biliary obstruction.  Right pleural effusion CT chest showing:   Small right pleural effusions. Partial consolidation of the right lower lung lobe, atelectasis or possibly pneumonia.  Left basilar atelectasis noted.  Cholelithiasis. Vague soft tissue inflammation at the  bladder raises question for cholecystitis. Would correlate for any associated symptoms.  Mildly enlarged aortopulmonary window node, measuring 1.2 cm in short axis.  Scattered coronary artery calcifications seen.  Right renal cyst.   Lipase normal.  t bili 1.2 .. 1.1 >> 1.4.  Alk phos 208 >>194 >>217.  AST/ALT 64/61 >>52/50 >> 57/56.   Not anemic, platelets WNL.    Overnight had recurrent, severe, epigastric pain, radiating across to RUQ.  Nausea and small volume "spitting up" of bilious material.  Overall however, the nausea is better.    Dilaudid given at 10:42 AM and pain has subsided.  GI cocktail last night did not help, slight improvement after repeat dose at 6:45 this AM.      Past  Medical History:  Diagnosis Date  . Barrett's esophagus   . CHF (congestive heart failure) ([place])   . Diabetes mellitus without complication ([place])   . Esophageal varices ([place])   . High cholesterol   . Hypertension   . Pneumonia     Past Surgical History:  Procedure Laterality Date  . BACK SURGERY    . CATARACT EXTRACTION    . HAND TENDON SURGERY    . KNEE SURGERY    . LEFT HEART CATH  09/2016   Minimal, non-occlusive CAD, EF 45%    Prior to Admission medications   Medication Sig Start Date End Date Taking? Authorizing Provider  aspirin EC 81 MG tablet Take 81 mg by mouth daily. 12/23/14  Yes [provider]  furosemide (LASIX) 40 MG tablet Take 40 mg by mouth 2 (two) times daily. 11/18/16 12/18/16 Yes [provider]  gabapentin (NEURONTIN) 100 MG capsule Take 100 mg by mouth at bedtime. 11/07/16 11/07/17 Yes [provider]  glipiZIDE (GLUCOTROL) 5 MG tablet Take 2.5 mg by mouth 2 (two) times daily. 09/21/16  Yes [provider]  metoprolol succinate (TOPROL-XL) 25 MG 24 hr tablet Take 25 mg by mouth daily. HR<55/min or SBP <100 mm hg 11/18/16 12/18/16 Yes [provider]  nitroGLYCERIN (NITROSTAT) 0.4 MG SL tablet Place 0.4 mg under the tongue. 10/05/16 10/05/17 Yes [provider]  omeprazole (PRILOSEC) 40 MG capsule Take 40 mg by mouth daily.   Yes [provider]  potassium chloride SA (K-DUR,KLOR-CON) 20 MEQ tablet Take 20 mEq by mouth daily as needed. When taking lasix 11/07/16 11/07/17 Yes [provider]  repaglinide (PRANDIN) 0.5 MG tablet Take 0.5 mg by mouth 3 (three) times daily. 11/07/16 11/07/17 Yes [provider]  vitamin B-12 (CYANOCOBALAMIN) 500 MCG tablet Take 500 mcg by mouth every other day.   Yes [provider]  zolpidem (AMBIEN) 5 MG tablet Take 5 mg by mouth at bedtime as needed for sleep. 11/18/16  Yes [provider]    Scheduled Meds: . aspirin  81 mg Oral Daily  . furosemide   40 mg Oral BID  . gabapentin  100 mg Oral QHS  . heparin subcutaneous  5,000 Units Subcutaneous Q8H  . insulin aspart  0-9 Units Subcutaneous TID WC  . metoprolol succinate  25 mg Oral Daily  . pantoprazole  80 mg Oral Daily  . potassium chloride  20 mEq Oral Daily  . rosuvastatin  10 mg Oral Daily   Infusions:  PRN Meds: acetaminophen, gi cocktail, HYDROmorphone (DILAUDID) injection, ondansetron (ZOFRAN) IV, zolpidem   Allergies as of 11/19/2016  . (No Known Allergies)    Family History  Problem Relation Age of Onset  . Cirrhosis Neg Hx  Social History   Social History  . Marital status: Married    Spouse name: N/A  . Number of children: N/A  . Years of education: N/A   Occupational History  . Not on file.   Social History Main Topics  . Smoking status: Former Research scientist (life sciences)  . Smokeless tobacco: Never Used  . Alcohol use Yes     Comment: occ  . Drug use: No  . Sexual activity: Not on file   Other Topics Concern  . Not on file   Social History Narrative  . No narrative on file    REVIEW OF SYSTEMS: Constitutional:  generall weakness and fatigue in recent weeks ENT:  No nose bleeds Pulm:  SOB improved CV:  Non-exertional chest pain.  No palpitations, no LE edema.  GU:  No hematuria, no frequency. GI:  Per HPI. occasional Heme:  No unusual or excessive bleeding   Transfusions:  None per his recall Neuro:  Restless leg movements Derm:  No itching, no rash or sores.  Endocrine:  No sweats or chills.  No polyuria or dysuria Immunization:  Not queried Travel:  None beyond local counties in last few months.    PHYSICAL EXAM: Vital signs in last 24 hours: Vitals:   11/21/16 1034 11/21/16 1039  BP: (!) 126/103 138/84  Pulse: 65   Resp:    Temp:    SpO2: 96%    Wt Readings from Last 3 Encounters:  11/21/16 89.1 kg (196 lb 6.4 oz)    General: pleasant, slightly confused elederly WM who looks well Head:  No asymmetry or signs of trauma.  Eyes:  No  icterus or pallor Ears:  Not HOH  Nose:  No congestion or discharge Mouth:  Clear, moist oral MM.  Tongue midline.  Good dental. Neck:  No mass, no JVD, no TMG Lungs:  Reduced BS on right.  No dyspnea or cough Heart: .   Abdomen:  soft, NT, ND.  Active BS.   Rectal: deferred   Musc/Skeltl: no joint swelling or redness, no gross deformities Extremities:  No CCE  Neurologic:  Oriented x 3.  No tremor, no limb weakness.   Skin:  No rash, no sores, no jaundice Tattoos:  None seen Nodes:  No cervical adenopathy   Psych:  Pleasant, cooperative, minimally anxious.    Intake/Output from previous day: 08/08 0701 - 08/09 0700 In: 480 [P.O.:480] Out: 950 [Urine:950]  LAB RESULTS:  Recent Labs  11/19/16 1326 11/20/16 0114 11/21/16 0249  WBC 10.0 7.9 11.1*  HGB 14.3 13.4 14.0  HCT 43.0 41.5 42.6  PLT 180 166 175   BMET Lab Results  Component Value Date   NA 142 11/21/2016   NA 143 11/20/2016   NA 141 11/19/2016   K 3.7 11/21/2016   K 3.9 11/20/2016   K 3.8 11/19/2016   CL 100 (L) 11/21/2016   CL 104 11/20/2016   CL 100 (L) 11/19/2016   CO2 29 11/21/2016   CO2 29 11/20/2016   CO2 30 11/19/2016   GLUCOSE 133 (H) 11/21/2016   GLUCOSE 121 (H) 11/20/2016   GLUCOSE 101 (H) 11/19/2016   BUN 21 (H) 11/21/2016   BUN 21 (H) 11/20/2016   BUN 26 (H) 11/19/2016   CREATININE 1.56 (H) 11/21/2016   CREATININE 1.58 (H) 11/20/2016   CREATININE 1.69 (H) 11/19/2016   CALCIUM 8.7 (L) 11/21/2016   CALCIUM 8.4 (L) 11/20/2016   CALCIUM 8.6 (L) 11/19/2016   LFT  Recent Labs  11/19/16 1326 11/20/16  0114 11/21/16 0249  PROT 6.4* 5.4* 6.1*  ALBUMIN 3.2* 2.7* 3.1*  AST 64* 52* 57*  ALT 61 50 56  ALKPHOS 208* 194* 217*  BILITOT 1.2 1.1 1.4*   PT/INR Lab Results  Component Value Date   INR 2.0 (H) 06/30/2008   INR 2.0 (H) 06/13/2008   INR 2.0 (H) 06/12/2008   Hepatitis Panel  Recent Labs  11/19/16 2057  HEPBSAG Negative  HCVAB <0.1  HEPAIGM Negative  HEPBIGM Negative    Lipase     Component Value Date/Time   LIPASE 35 11/19/2016 1326     RADIOLOGY STUDIES: Dg Chest 2 View  Result Date: 11/19/2016 CLINICAL DATA:  Chest pain and shortness of breath EXAM: CHEST  2 VIEW COMPARISON:  November 17, 2016 FINDINGS: There is airspace consolidation in the right middle lobe consistent with pneumonia. There is a minimal right pleural effusion. Lungs elsewhere clear. Heart is borderline enlarged with pulmonary vascularity within normal limits. No adenopathy. There is aortic atherosclerosis. There is degenerative change in the thoracic spine. IMPRESSION: Consolidation in the right middle lobe, consistent with pneumonia. Small right pleural effusion. Left lung clear. Heart prominent with pulmonary vascularity within normal limits. There is aortic atherosclerosis. Aortic Atherosclerosis (ICD10-I70.0). Electronically Signed   By: Lowella Grip III M.D.   On: 11/19/2016 14:57   Ct Chest Wo Contrast  Result Date: 11/20/2016 CLINICAL DATA:  Acute onset of generalized chest pain and shortness of breath. Initial encounter. EXAM: CT CHEST WITHOUT CONTRAST TECHNIQUE: Multidetector CT imaging of the chest was performed following the standard protocol without IV contrast. COMPARISON:  Chest radiograph performed earlier today at 3:02 p.m., and CTA of the chest performed 10/03/2016 FINDINGS: Cardiovascular: The heart is borderline normal in size. Mild calcification is noted at the aortic arch. Scattered coronary artery calcifications are seen. The great vessels are grossly unremarkable in appearance. Mediastinum/Nodes: A mildly enlarged aortopulmonary window node is seen, measuring 1.2 cm in short axis. No pericardial effusion is identified. The thyroid gland is unremarkable. No axillary lymphadenopathy is seen. Lungs/Pleura: A small right pleural effusion is noted. Partial consolidation of the right lower lobe may reflect atelectasis or possibly mild infection. Mild left basilar atelectasis  is noted. No pneumothorax is seen. No dominant mass is identified. Upper Abdomen: The visualized portions of the liver and spleen are unremarkable. Vague soft tissue inflammation is noted about the gallbladder, raising concern for cholecystitis. Stones are noted within the gallbladder. The visualized portions of the pancreas and adrenal glands are within normal limits. A right renal cyst is noted. Musculoskeletal: No acute osseous abnormalities are identified. Anterior bridging osteophytes are seen along the thoracic spine. The visualized musculature is unremarkable in appearance. IMPRESSION: 1. Small right pleural effusions. Partial consolidation of the right lower lung lobe may reflect atelectasis or possibly pneumonia. Mild left basilar atelectasis noted. 2. Vague soft tissue inflammation at the bladder raises question for cholecystitis. Would correlate for any associated symptoms. Cholelithiasis noted. 3. Mildly enlarged aortopulmonary window node, measuring 1.2 cm in short axis. 4. Scattered coronary artery calcifications seen. 5. Right renal cyst noted. Electronically Signed   By: Garald Balding M.D.   On: 11/20/2016 02:59   US Abdomen Limited Ruq  Result Date: 11/19/2016 CLINICAL DATA:  76 year old male with sharp epigastric pain yesterday which persisted for several hours. Known gallstones. EXAM: ULTRASOUND ABDOMEN LIMITED RIGHT UPPER QUADRANT COMPARISON:  [facility] ultrasound 11/10/2016. [facility] CT Abdomen and Pelvis 05/16/2005 FINDINGS: Gallbladder: Confluent echogenic material located dependent  in the gallbladder fundus compatible with tumefactive sludge (image 13). No vascularity on power Doppler (image 22). Suspect small superimposed gallstones measuring 2-3 mm (image 19). Gallbladder wall thickness remains normal. No pericholecystic fluid. No sonographic Murphy sign elicited. Common bile duct: Diameter: 3 mm , normal Liver: No focal lesion identified. Within normal  limits in parenchymal echogenicity. Other findings: Visible right kidney is within normal limits. Right pleural effusion partially visible (image 56). IMPRESSION: 1. Dependent tumefactive sludge and small gallstones within the gallbladder. No evidence of acute cholecystitis or biliary obstruction. 2. Right pleural effusion. Electronically Signed   By: Genevie Ann M.D.   On: 11/19/2016 14:38     IMPRESSION:   *  2 plus months of unexplained n/v and more recent epigastric pain in pt with hx Barrett's esophagus, hx cirrhosis.   Per studies at [facility] and ultrasound repeat yesterday has uncomplicated cholelithiasis, unremarkable HIDA scan and unremarkable GES. CT chest yesterday with the vague GB inflammation.   Interestingly on recent ultrasounds, liver described as normal though described as fatty in 06/2016 and 12/2015 and of having coarse texture, ? Mild cirrhosis in 05/2014 .   LFTs mildly elevated over last few weeks and the epigastric/RUQ pain flared 2 nights ago and overnight.    *  CHF.      *   Right pleural effusion, thoracentesis 6/22.  Now with RML consolidation and small effusion.  No antibiotics on board at present.    PLAN:     *  Consider general surgery consult re: ? Symptomatic cholelithiasis.   Dr Carlean Purl to follow     Azucena Freed  11/21/2016, 11:05 AM Pager: [phone]    [facility] Attending   I have taken an interval history, reviewed the chart and examined the patient. I agree with the Advanced Practitioner's note, impression and recommendations.   The predominant sx is nausea from GI perspective though does not have to be a GI cause.  His RUQ and epigastric pain could be from gallstones.  His heart failure and associated problems could also be playing some role.  I think having a [facility] consult to see if they think this is symptomatic cholelithiasis is reasonablke though I would not commit that a cholecystectomy will stop all GI sxs.  Gatha Mayer, MD, [facility] [phone] (pager) 11/21/2016 2:27 PM

## 2016-11-21 NOTE — Progress Notes (Signed)
Patient much improved after addition of dilaudid this shift, VSS.  Patient anticipating gallbladder surgery 11/22/16.  Daughters at bedside.

## 2016-11-21 NOTE — Progress Notes (Signed)
Pt. C/o mid sternal pain unrelieved with Tylenol.On call for Cardiology paged to make aware.

## 2016-11-21 NOTE — Plan of Care (Signed)
Problem: Pain Managment: Goal: General experience of comfort will improve Outcome: Progressing Pain much improved with addition of Dilaudid

## 2016-11-21 NOTE — Progress Notes (Signed)
[facility]  PROGRESS NOTE  Dillon Andrews BMW:[id number] DOB: 1941-03-12 DOA: 11/19/2016 PCP: No primary care provider on file.   Brief HPI:   76 y.o. male with medical history significant of Systolic CHF with EF 27-25% appears to be NICM with only minimal non-occlusive CAD on cath in June of this year; Esophageal varices due to presumed non alcoholic cirrhosis of liver followed by GI Dr. Marin Comment; DM2, HTN, HLD, CKD stage 3 with baseline creat of 1.5; NSVT started on Amiodarone in June after heart cath, Amiodarone stopped during hospital stay this past week due to epigastric discomfort and nausea.  Patient was just hospitalized at [facility] this past week with acute on chronic systolic CHF exacerbation.  His breathing improved significantly with diuresis.  During that hospital stay he was noted to have a RML consolidation and R pleural effusion.  No fevers, no SIRS, completed course of ABx, pro calcitonin was negative.  Ultimately this was felt to be due to asymmetric pulmonary edema.  Patient was discharged from hospital yesterday, family didn't want him to go back to [facility].  Last evening he developed epigastric and RUQ abdominal pain and tenderness.  This has been intermittent and ongoing for past several months.  Associated nausea, no vomiting.  Subjective   This morning patient complains of epigastric pain with radiation to right upper quadrant, slight improvement with GI cocktail. Had to be given IV Dilaudid for pain control.   Assessment/Plan:     1. Nausea/epigastric pain- patient had negative GI emptying study during previous hospital stay, HIDA scan also was negative. Abdominal ultrasound shows cholelithiasis,But no cholecystitis patient does have history of elevated liver enzymes .GI was consulted and did recommend general surgery consultation for possible symptomatic cholelithiasis, will consult general surgery for further recommendations. Continue Protonix 80 mg by mouth  daily. 2. Elevated troponin- patient has mild elevation of troponin, cardiac cath in June showed minimal nonocclusive CAD. He denies chest pain. [facility] consult cardiology  for further recommendations. 3. Right middle lobe consolidation/right pleural effusion- CT chest showed atelectasis versus pneumonia, minimal effusion. Status post thoracentesis at [facility] hospital. No antibiotics were initiated by admitting physician, I agree that patient does not appear to be having pneumonia at this time. 4. Chronic systolic CHF- stable, euvolemic. Continue furosemide 40 mg by mouth twice a day. 5. History of NSVT- amiodarone was  stopped due to intolerance during last hospital stay at [facility] hospital. 6. Diabetes mellitus- continue sliding scale insulin. 7. Hyperlipidemia- continue Crestor. 8. Hypertension-blood pressure is stable, continue metoprolol 9. CK D stage III- creatinine at  Baseline 1.56.    DVT prophylaxis: Heparin  Code Status: Full code  Family Communication: discussed with patient's daughter at bedside   Disposition Plan: Likely home in 1-2 days   Consultants:  None  Procedures:  None  Continuous infusions     Antibiotics:   Anti-infectives    Start     Dose/Rate Route Frequency Ordered Stop   11/19/16 1515  ceFEPIme (MAXIPIME) 1 g in dextrose 5 % 50 mL IVPB     1 g 100 mL/hr over 30 Minutes Intravenous  Once 11/19/16 1503 11/19/16 1609   11/19/16 1515  vancomycin (VANCOCIN) IVPB 1000 mg/200 mL premix     1,000 mg 200 mL/hr over 60 Minutes Intravenous  Once 11/19/16 1503 11/19/16 1752   11/19/16 1511  ceFEPIme (MAXIPIME) 1 g injection    Comments:  [name]   : cabinet override  11/19/16 1511 11/20/16 0314       Objective   Vitals:   11/21/16 0559 11/21/16 0815 11/21/16 1034 11/21/16 1039  BP: 140/74 135/79 (!) 126/103 138/84  Pulse: 70 63 65   Resp: 18     Temp: 98 F (36.7 C) 98.7 F (37.1 C)    TempSrc: Oral Oral     SpO2: 97% 96% 96%   Weight: 89.1 kg (196 lb 6.4 oz)     Height:        Intake/Output Summary (Last 24 hours) at 11/21/16 1434 Last data filed at 11/21/16 0931  Gross per 24 hour  Intake              720 ml  Output              950 ml  Net             -230 ml   Filed Weights   11/19/16 1213 11/20/16 0604 11/21/16 0559  Weight: 91.8 kg (202 lb 6.1 oz) 89.4 kg (197 lb 1.6 oz) 89.1 kg (196 lb 6.4 oz)     Physical Examination:  Physical Exam: Eyes: No icterus, extraocular muscles intact  Mouth: Oral mucosa is moist, no lesions on palate,  Neck: Supple, no deformities, masses, or tenderness Lungs: Normal respiratory effort, bilateral clear to auscultation, no crackles or wheezes.  Heart: Regular rate and rhythm, S1 and S2 normal, no murmurs, rubs auscultated Abdomen: Soft, nontender to palpation. No organomegaly Extremities: No pretibial edema, no erythema, no cyanosis, no clubbing Neuro : Alert and oriented to time, place and person, No focal deficits Skin: No rashes seen on exam    Data Reviewed: I have personally reviewed following labs and imaging studies  CBG:  Recent Labs Lab 11/20/16 1233 11/20/16 1646 11/20/16 2145 11/21/16 0820 11/21/16 1229  GLUCAP 125* 136* 185* 161* 136*    CBC:  Recent Labs Lab 11/19/16 1326 11/20/16 0114 11/21/16 0249  WBC 10.0 7.9 11.1*  NEUTROABS 7.9*  --   --   HGB 14.3 13.4 14.0  HCT 43.0 41.5 42.6  MCV 97.5 96.7 97.3  PLT 180 166 270    Basic Metabolic Panel:  Recent Labs Lab 11/19/16 1326 11/20/16 0114 11/21/16 0249  NA 141 143 142  K 3.8 3.9 3.7  CL 100* 104 100*  CO2 30 29 29   GLUCOSE 101* 121* 133*  BUN 26* 21* 21*  CREATININE 1.69* 1.58* 1.56*  CALCIUM 8.6* 8.4* 8.7*    No results found for this or any previous visit (from the past 240 hour(s)).   Liver Function Tests:  Recent Labs Lab 11/19/16 1326 11/20/16 0114 11/21/16 0249  AST 64* 52* 57*  ALT 61 50 56  ALKPHOS 208* 194* 217*  BILITOT  1.2 1.1 1.4*  PROT 6.4* 5.4* 6.1*  ALBUMIN 3.2* 2.7* 3.1*    Recent Labs Lab 11/19/16 1326  LIPASE 35   No results for input(s): AMMONIA in the last 168 hours.  Cardiac Enzymes:  Recent Labs Lab 11/19/16 1326 11/19/16 2057 11/20/16 0114 11/20/16 0738  TROPONINI 0.23* 0.29* 0.31* 0.31*   BNP (last 3 results)  Recent Labs  11/19/16 1326  BNP 439.5*    ProBNP (last 3 results) No results for input(s): PROBNP in the last 8760 hours.    Studies: Dg Chest 2 View  Result Date: 11/19/2016 CLINICAL DATA:  Chest pain and shortness of breath EXAM: CHEST  2 VIEW COMPARISON:  November 17, 2016 FINDINGS: There is airspace consolidation  in the right middle lobe consistent with pneumonia. There is a minimal right pleural effusion. Lungs elsewhere clear. Heart is borderline enlarged with pulmonary vascularity within normal limits. No adenopathy. There is aortic atherosclerosis. There is degenerative change in the thoracic spine. IMPRESSION: Consolidation in the right middle lobe, consistent with pneumonia. Small right pleural effusion. Left lung clear. Heart prominent with pulmonary vascularity within normal limits. There is aortic atherosclerosis. Aortic Atherosclerosis (ICD10-I70.0). Electronically Signed   By: Lowella Grip III M.D.   On: 11/19/2016 14:57   Ct Chest Wo Contrast  Result Date: 11/20/2016 CLINICAL DATA:  Acute onset of generalized chest pain and shortness of breath. Initial encounter. EXAM: CT CHEST WITHOUT CONTRAST TECHNIQUE: Multidetector CT imaging of the chest was performed following the standard protocol without IV contrast. COMPARISON:  Chest radiograph performed earlier today at 3:02 p.m., and CTA of the chest performed 10/03/2016 FINDINGS: Cardiovascular: The heart is borderline normal in size. Mild calcification is noted at the aortic arch. Scattered coronary artery calcifications are seen. The great vessels are grossly unremarkable in appearance. Mediastinum/Nodes: A  mildly enlarged aortopulmonary window node is seen, measuring 1.2 cm in short axis. No pericardial effusion is identified. The thyroid gland is unremarkable. No axillary lymphadenopathy is seen. Lungs/Pleura: A small right pleural effusion is noted. Partial consolidation of the right lower lobe may reflect atelectasis or possibly mild infection. Mild left basilar atelectasis is noted. No pneumothorax is seen. No dominant mass is identified. Upper Abdomen: The visualized portions of the liver and spleen are unremarkable. Vague soft tissue inflammation is noted about the gallbladder, raising concern for cholecystitis. Stones are noted within the gallbladder. The visualized portions of the pancreas and adrenal glands are within normal limits. A right renal cyst is noted. Musculoskeletal: No acute osseous abnormalities are identified. Anterior bridging osteophytes are seen along the thoracic spine. The visualized musculature is unremarkable in appearance. IMPRESSION: 1. Small right pleural effusions. Partial consolidation of the right lower lung lobe may reflect atelectasis or possibly pneumonia. Mild left basilar atelectasis noted. 2. Vague soft tissue inflammation at the bladder raises question for cholecystitis. Would correlate for any associated symptoms. Cholelithiasis noted. 3. Mildly enlarged aortopulmonary window node, measuring 1.2 cm in short axis. 4. Scattered coronary artery calcifications seen. 5. Right renal cyst noted. Electronically Signed   By: Garald Balding M.D.   On: 11/20/2016 02:59    Scheduled Meds: . aspirin  81 mg Oral Daily  . furosemide  40 mg Oral BID  . gabapentin  100 mg Oral QHS  . heparin subcutaneous  5,000 Units Subcutaneous Q8H  . insulin aspart  0-9 Units Subcutaneous TID WC  . metoprolol succinate  25 mg Oral Daily  . pantoprazole  80 mg Oral Daily  . potassium chloride  20 mEq Oral Daily  . rosuvastatin  10 mg Oral Daily      Time spent: 25 min  [facility] Pager [phone]. If 7PM-7AM, please contact night-coverage at [email or web address], Office  [phone]  password [email or web address]  11/21/2016, 2:34 PM  LOS: 2 days

## 2016-11-21 NOTE — Consult Note (Signed)
[facility] Consult Note  Dillon Andrews 10-Nov-1940  [id number].    Requesting MD: Eleonore Chiquito Chief Complaint/Reason for Consult: Gallstones  HPI:  LEW PROUT is a 76yo male with MMP admitted to [facility] 8/7 with 1 day of epigastric and RUQ abdominal pain and nausea. Patient states that he has had anorexia and nausea for 2 months. It has been rather constant. Not necessarily worse with eating. Denies emesis, diarrhea, constipation, fever, or chills. This was his second bout of epigastric/RUQ pain. States that last night was his most severe attack. Pain was constant and severe; tylenol and GI cocktail did not help.  Of note, patient was admitted to [facility] for several days in June and July of this year. Just discharged on 8/6. S/p cardiac cath 09/30/16, LVEF of 40-45%.  10/04/16 right thoracentesis to address pleural effusion.  Hospital workup: - u/s 8/7 shows cholelithiasis, normal wall thickening, no pericholecystic fluid, no Murphy sign - mild LFT elevation on arrival, now slightly higher AST 57, ALT 56, bilirubin 1.4 - WBC up to 11.1 today - HIDA during previous admission 7/30 was negative  PMH significant for Systolic CHF, CAD (EF 29-52% by cath), NASH with esophageal varices, Barrett's esophagus, DM, HTN, HLD, CKD-III Abdominal surgical history: none  ROS: Review of Systems  Constitutional:       Anorexia  HENT: Negative.   Eyes: Negative.   Respiratory: Negative.   Cardiovascular: Negative.   Gastrointestinal: Positive for abdominal pain and nausea. Negative for blood in stool, constipation, diarrhea, melena and vomiting.  Genitourinary: Negative.   Musculoskeletal: Negative.   Skin: Negative.   Neurological: Negative.   All systems reviewed and otherwise negative except for as above  Family History  Problem Relation Age of Onset  . Cirrhosis Neg Hx     Past Medical History:  Diagnosis Date  . Barrett's esophagus   . CHF (congestive heart  failure) ([place])   . Diabetes ([place])    type 2, no insulin required as outpt as of 11/2015.    . Diabetes mellitus without complication ([place])   . Esophageal varices ([place])   . High cholesterol   . Hypertension   . Pneumonia     Past Surgical History:  Procedure Laterality Date  . BACK SURGERY    . CATARACT EXTRACTION    . HAND TENDON SURGERY    . KNEE SURGERY    . LEFT HEART CATH  09/2016   Minimal, non-occlusive CAD, EF 45%    Social History:  reports that he has quit smoking. He has never used smokeless tobacco. He reports that he drinks alcohol. He reports that he does not use drugs.  Allergies: No Known Allergies  Medications Prior to Admission  Medication Sig Dispense Refill  . aspirin EC 81 MG tablet Take 81 mg by mouth daily.    . furosemide (LASIX) 40 MG tablet Take 40 mg by mouth 2 (two) times daily.    Marland Kitchen gabapentin (NEURONTIN) 100 MG capsule Take 100 mg by mouth at bedtime.    Marland Kitchen glipiZIDE (GLUCOTROL) 5 MG tablet Take 2.5 mg by mouth 2 (two) times daily.  0  . metoprolol succinate (TOPROL-XL) 25 MG 24 hr tablet Take 25 mg by mouth daily. HR<55/min or SBP <100 mm hg    . nitroGLYCERIN (NITROSTAT) 0.4 MG SL tablet Place 0.4 mg under the tongue.    Marland Kitchen omeprazole (PRILOSEC) 40 MG capsule Take 40 mg by mouth daily.    . potassium chloride  SA (K-DUR,KLOR-CON) 20 MEQ tablet Take 20 mEq by mouth daily as needed. When taking lasix    . repaglinide (PRANDIN) 0.5 MG tablet Take 0.5 mg by mouth 3 (three) times daily.    . vitamin B-12 (CYANOCOBALAMIN) 500 MCG tablet Take 500 mcg by mouth every other day.    . zolpidem (AMBIEN) 5 MG tablet Take 5 mg by mouth at bedtime as needed for sleep.      Prior to Admission medications   Medication Sig Start Date End Date Taking? Authorizing Provider  aspirin EC 81 MG tablet Take 81 mg by mouth daily. 12/23/14  Yes [provider]  furosemide (LASIX) 40 MG tablet Take 40 mg by mouth 2 (two) times daily. 11/18/16 12/18/16 Yes [provider]  gabapentin (NEURONTIN) 100 MG capsule Take 100 mg by mouth at bedtime. 11/07/16 11/07/17 Yes [provider]  glipiZIDE (GLUCOTROL) 5 MG tablet Take 2.5 mg by mouth 2 (two) times daily. 09/21/16  Yes [provider]  metoprolol succinate (TOPROL-XL) 25 MG 24 hr tablet Take 25 mg by mouth daily. HR<55/min or SBP <100 mm hg 11/18/16 12/18/16 Yes [provider]  nitroGLYCERIN (NITROSTAT) 0.4 MG SL tablet Place 0.4 mg under the tongue. 10/05/16 10/05/17 Yes [provider]  omeprazole (PRILOSEC) 40 MG capsule Take 40 mg by mouth daily.   Yes [provider]  potassium chloride SA (K-DUR,KLOR-CON) 20 MEQ tablet Take 20 mEq by mouth daily as needed. When taking lasix 11/07/16 11/07/17 Yes [provider]  repaglinide (PRANDIN) 0.5 MG tablet Take 0.5 mg by mouth 3 (three) times daily. 11/07/16 11/07/17 Yes [provider]  vitamin B-12 (CYANOCOBALAMIN) 500 MCG tablet Take 500 mcg by mouth every other day.   Yes [provider]  zolpidem (AMBIEN) 5 MG tablet Take 5 mg by mouth at bedtime as needed for sleep. 11/18/16  Yes [provider]    Blood pressure 138/84, pulse 65, temperature 98.7 F (37.1 C), temperature source Oral, resp. rate 18, height _0  (1.803 m), weight 196 lb 6.4 oz (89.1 kg), SpO2 96 %. Physical Exam: General: pleasant, WD/WN white male who is laying in bed in NAD HEENT: head is normocephalic, atraumatic.  Sclera are noninjected.  Pupils equal and round.  Ears and nose without any masses or lesions.  Mouth is pink and moist. Dentition fair Heart: regular, rate, and rhythm.  No obvious murmurs, gallops, or rubs noted.  Palpable pedal pulses bilaterally Lungs: CTAB, no wheezes, rhonchi, or rales noted.  Respiratory effort nonlabored Abd: soft, ND, +BS, no masses, hernias, or organomegaly. Mild epigastric/RUQ tenderness with no rebound or guarding. Negative Murphy sign MS: all 4 extremities are  symmetrical with no cyanosis, clubbing, or edema. Skin: warm and dry with no masses, lesions, or rashes Psych: A&Ox3 with an appropriate affect. Neuro: cranial nerves grossly intact, extremity CSM intact bilaterally, normal speech  Results for orders placed or performed during the hospital encounter of 11/19/16 (from the past 48 hour(s))  Troponin I (q 6hr x 3)     Status: Abnormal   Collection Time: 11/19/16  8:57 PM  Result Value Ref Range   Troponin I 0.29 (HH) <0.03 ng/mL    Comment: CRITICAL RESULT CALLED TO, READ BACK BY AND VERIFIED WITH: BUNDIA A,RN 11/19/16 2201 WAYK   Hepatitis panel, acute     Status: None   Collection Time: 11/19/16  8:57 PM  Result Value Ref Range   Hepatitis B Surface Ag Negative Negative  HCV Ab <0.1 0.0 - 0.9 s/co ratio    Comment: (NOTE)                                  Negative:     < 0.8                             Indeterminate: 0.8 - 0.9                                  Positive:     > 0.9 The CDC recommends that a positive HCV antibody result be followed up with a HCV Nucleic Acid Amplification test ([id number]). Performed At: [facility] [place], [place] [id number] Lindon Romp MD YK:[id number]    Hep A IgM Negative Negative   Hep B C IgM Negative Negative  Glucose, capillary     Status: Abnormal   Collection Time: 11/19/16  9:52 PM  Result Value Ref Range   Glucose-Capillary 122 (H) 65 - 99 mg/dL   Comment 1 Notify RN    Comment 2 Document in Chart   Comprehensive metabolic panel     Status: Abnormal   Collection Time: 11/20/16  1:14 AM  Result Value Ref Range   Sodium 143 135 - 145 mmol/L   Potassium 3.9 3.5 - 5.1 mmol/L   Chloride 104 101 - 111 mmol/L   CO2 29 22 - 32 mmol/L   Glucose, Bld 121 (H) 65 - 99 mg/dL   BUN 21 (H) 6 - 20 mg/dL   Creatinine, Ser 1.58 (H) 0.61 - 1.24 mg/dL   Calcium 8.4 (L) 8.9 - 10.3 mg/dL   Total Protein 5.4 (L) 6.5 - 8.1 g/dL   Albumin 2.7 (L) 3.5 - 5.0 g/dL   AST 52 (H)  15 - 41 U/L   ALT 50 17 - 63 U/L   Alkaline Phosphatase 194 (H) 38 - 126 U/L   Total Bilirubin 1.1 0.3 - 1.2 mg/dL   GFR calc non Af Amer 41 (L) >60 mL/min   GFR calc Af Amer 47 (L) >60 mL/min    Comment: (NOTE) The eGFR has been calculated using the CKD EPI equation. This calculation has not been validated in all clinical situations. eGFR's persistently <60 mL/min signify possible Chronic Kidney Disease.    Anion gap 10 5 - 15  CBC     Status: None   Collection Time: 11/20/16  1:14 AM  Result Value Ref Range   WBC 7.9 4.0 - 10.5 K/uL   RBC 4.29 4.22 - 5.81 MIL/uL   Hemoglobin 13.4 13.0 - 17.0 g/dL   HCT 41.5 39.0 - 52.0 %   MCV 96.7 78.0 - 100.0 fL   MCH 31.2 26.0 - 34.0 pg   MCHC 32.3 30.0 - 36.0 g/dL   RDW 15.2 11.5 - 15.5 %   Platelets 166 150 - 400 K/uL  Troponin I (q 6hr x 3)     Status: Abnormal   Collection Time: 11/20/16  1:14 AM  Result Value Ref Range   Troponin I 0.31 (HH) <0.03 ng/mL    Comment: CRITICAL VALUE NOTED.  VALUE IS CONSISTENT WITH PREVIOUSLY REPORTED AND CALLED VALUE.  Troponin I (q 6hr x 3)     Status: Abnormal   Collection Time: 11/20/16  7:38  AM  Result Value Ref Range   Troponin I 0.31 (HH) <0.03 ng/mL    Comment: CRITICAL VALUE NOTED.  VALUE IS CONSISTENT WITH PREVIOUSLY REPORTED AND CALLED VALUE.  Glucose, capillary     Status: Abnormal   Collection Time: 11/20/16  7:51 AM  Result Value Ref Range   Glucose-Capillary 109 (H) 65 - 99 mg/dL  Glucose, capillary     Status: Abnormal   Collection Time: 11/20/16 12:33 PM  Result Value Ref Range   Glucose-Capillary 125 (H) 65 - 99 mg/dL  Glucose, capillary     Status: Abnormal   Collection Time: 11/20/16  4:46 PM  Result Value Ref Range   Glucose-Capillary 136 (H) 65 - 99 mg/dL  Glucose, capillary     Status: Abnormal   Collection Time: 11/20/16  9:45 PM  Result Value Ref Range   Glucose-Capillary 185 (H) 65 - 99 mg/dL   Comment 1 Notify RN    Comment 2 Document in Chart   Comprehensive  metabolic panel     Status: Abnormal   Collection Time: 11/21/16  2:49 AM  Result Value Ref Range   Sodium 142 135 - 145 mmol/L   Potassium 3.7 3.5 - 5.1 mmol/L   Chloride 100 (L) 101 - 111 mmol/L   CO2 29 22 - 32 mmol/L   Glucose, Bld 133 (H) 65 - 99 mg/dL   BUN 21 (H) 6 - 20 mg/dL   Creatinine, Ser 1.56 (H) 0.61 - 1.24 mg/dL   Calcium 8.7 (L) 8.9 - 10.3 mg/dL   Total Protein 6.1 (L) 6.5 - 8.1 g/dL   Albumin 3.1 (L) 3.5 - 5.0 g/dL   AST 57 (H) 15 - 41 U/L   ALT 56 17 - 63 U/L   Alkaline Phosphatase 217 (H) 38 - 126 U/L   Total Bilirubin 1.4 (H) 0.3 - 1.2 mg/dL   GFR calc non Af Amer 41 (L) >60 mL/min   GFR calc Af Amer 48 (L) >60 mL/min    Comment: (NOTE) The eGFR has been calculated using the CKD EPI equation. This calculation has not been validated in all clinical situations. eGFR's persistently <60 mL/min signify possible Chronic Kidney Disease.    Anion gap 13 5 - 15  CBC     Status: Abnormal   Collection Time: 11/21/16  2:49 AM  Result Value Ref Range   WBC 11.1 (H) 4.0 - 10.5 K/uL   RBC 4.38 4.22 - 5.81 MIL/uL   Hemoglobin 14.0 13.0 - 17.0 g/dL   HCT 42.6 39.0 - 52.0 %   MCV 97.3 78.0 - 100.0 fL   MCH 32.0 26.0 - 34.0 pg   MCHC 32.9 30.0 - 36.0 g/dL   RDW 15.2 11.5 - 15.5 %   Platelets 175 150 - 400 K/uL  Glucose, capillary     Status: Abnormal   Collection Time: 11/21/16  8:20 AM  Result Value Ref Range   Glucose-Capillary 161 (H) 65 - 99 mg/dL  Glucose, capillary     Status: Abnormal   Collection Time: 11/21/16 12:29 PM  Result Value Ref Range   Glucose-Capillary 136 (H) 65 - 99 mg/dL   Ct Chest Wo Contrast  Result Date: 11/20/2016 CLINICAL DATA:  Acute onset of generalized chest pain and shortness of breath. Initial encounter. EXAM: CT CHEST WITHOUT CONTRAST TECHNIQUE: Multidetector CT imaging of the chest was performed following the standard protocol without IV contrast. COMPARISON:  Chest radiograph performed earlier today at 3:02 p.m., and CTA of the chest  performed 10/03/2016 FINDINGS: Cardiovascular: The heart is borderline normal in size. Mild calcification is noted at the aortic arch. Scattered coronary artery calcifications are seen. The great vessels are grossly unremarkable in appearance. Mediastinum/Nodes: A mildly enlarged aortopulmonary window node is seen, measuring 1.2 cm in short axis. No pericardial effusion is identified. The thyroid gland is unremarkable. No axillary lymphadenopathy is seen. Lungs/Pleura: A small right pleural effusion is noted. Partial consolidation of the right lower lobe may reflect atelectasis or possibly mild infection. Mild left basilar atelectasis is noted. No pneumothorax is seen. No dominant mass is identified. Upper Abdomen: The visualized portions of the liver and spleen are unremarkable. Vague soft tissue inflammation is noted about the gallbladder, raising concern for cholecystitis. Stones are noted within the gallbladder. The visualized portions of the pancreas and adrenal glands are within normal limits. A right renal cyst is noted. Musculoskeletal: No acute osseous abnormalities are identified. Anterior bridging osteophytes are seen along the thoracic spine. The visualized musculature is unremarkable in appearance. IMPRESSION: 1. Small right pleural effusions. Partial consolidation of the right lower lung lobe may reflect atelectasis or possibly pneumonia. Mild left basilar atelectasis noted. 2. Vague soft tissue inflammation at the bladder raises question for cholecystitis. Would correlate for any associated symptoms. Cholelithiasis noted. 3. Mildly enlarged aortopulmonary window node, measuring 1.2 cm in short axis. 4. Scattered coronary artery calcifications seen. 5. Right renal cyst noted. Electronically Signed   By: Garald Balding M.D.   On: 11/20/2016 02:59   Anti-infectives    Start     Dose/Rate Route Frequency Ordered Stop   11/19/16 1515  ceFEPIme (MAXIPIME) 1 g in dextrose 5 % 50 mL IVPB     1 g 100  mL/hr over 30 Minutes Intravenous  Once 11/19/16 1503 11/19/16 1609   11/19/16 1515  vancomycin (VANCOCIN) IVPB 1000 mg/200 mL premix     1,000 mg 200 mL/hr over 60 Minutes Intravenous  Once 11/19/16 1503 11/19/16 1752   11/19/16 1511  ceFEPIme (MAXIPIME) 1 g injection    Comments:  [name]   : cabinet override      11/19/16 1511 11/20/16 0314        Assessment/Plan Systolic CHF CAD (EF 16-10% by cath) NASH with esophageal varices Barrett's esophagus DM HTN HLD CKD-III  Symptomatic cholelithiasis - previous abdominal surgeries: none - 2 months of anorexia and nausea - cardiac workup negative for acute issue - u/s 8/7 shows cholelithiasis, normal wall thickening, no pericholecystic fluid, no Murphy sign - mild LFT elevation on arrival, now slightly higher AST 57, ALT 56, bilirubin 1.4 - WBC up to 11.1 today - HIDA during previous admission 7/30 was negative  ID - rocephin 8/9>> VTE - SCDs, heparin (hold after midnight) FEN - IVF, HH/carb modified, NPO after midnight  Plan - I have seen and evaluated this patient with Dr. Ninfa Linden. He does have cholelithiasis and some symptoms consistent with symptomatic cholelithiasis. Discussed risks and benefits of cholecystectomy. We discussed that there is no guarantee that this will completely resolve his pain. Patient wishes to proceed. Will plan for lap chole tomorrow if medically cleared for surgery.   Wellington Hampshire, [facility] Surgery 11/21/2016, 2:55 PM Pager: [phone] Consults: [phone] Mon-Fri 7:00 am-4:30 pm Sat-Sun 7:00 am-11:30 am

## 2016-11-22 ENCOUNTER — Observation Stay (HOSPITAL_COMMUNITY): Payer: Medicare HMO | Admitting: Certified Registered Nurse Anesthetist

## 2016-11-22 ENCOUNTER — Encounter (HOSPITAL_COMMUNITY): Payer: Self-pay

## 2016-11-22 ENCOUNTER — Encounter (HOSPITAL_COMMUNITY)
Admission: EM | Disposition: A | Discharge status: Home / Self Care | Payer: Self-pay | Source: Home / Self Care | Attending: Family Medicine

## 2016-11-22 DIAGNOSIS — K8012 Calculus of gallbladder with acute and chronic cholecystitis without obstruction: Secondary | ICD-10-CM | POA: Diagnosis present

## 2016-11-22 DIAGNOSIS — K668 Other specified disorders of peritoneum: Secondary | ICD-10-CM | POA: Diagnosis not present

## 2016-11-22 DIAGNOSIS — J189 Pneumonia, unspecified organism: Secondary | ICD-10-CM | POA: Diagnosis not present

## 2016-11-22 DIAGNOSIS — I428 Other cardiomyopathies: Secondary | ICD-10-CM | POA: Diagnosis present

## 2016-11-22 DIAGNOSIS — G2581 Restless legs syndrome: Secondary | ICD-10-CM | POA: Diagnosis present

## 2016-11-22 DIAGNOSIS — K819 Cholecystitis, unspecified: Secondary | ICD-10-CM | POA: Diagnosis not present

## 2016-11-22 DIAGNOSIS — I2510 Atherosclerotic heart disease of native coronary artery without angina pectoris: Secondary | ICD-10-CM | POA: Diagnosis present

## 2016-11-22 DIAGNOSIS — I440 Atrioventricular block, first degree: Secondary | ICD-10-CM | POA: Diagnosis present

## 2016-11-22 DIAGNOSIS — R7989 Other specified abnormal findings of blood chemistry: Secondary | ICD-10-CM | POA: Diagnosis present

## 2016-11-22 DIAGNOSIS — D62 Acute posthemorrhagic anemia: Secondary | ICD-10-CM | POA: Diagnosis not present

## 2016-11-22 DIAGNOSIS — K2270 Barrett's esophagus without dysplasia: Secondary | ICD-10-CM | POA: Diagnosis present

## 2016-11-22 DIAGNOSIS — Y95 Nosocomial condition: Secondary | ICD-10-CM | POA: Diagnosis not present

## 2016-11-22 DIAGNOSIS — E1122 Type 2 diabetes mellitus with diabetic chronic kidney disease: Secondary | ICD-10-CM | POA: Diagnosis present

## 2016-11-22 DIAGNOSIS — Z87891 Personal history of nicotine dependence: Secondary | ICD-10-CM | POA: Diagnosis not present

## 2016-11-22 DIAGNOSIS — R1011 Right upper quadrant pain: Secondary | ICD-10-CM | POA: Diagnosis not present

## 2016-11-22 DIAGNOSIS — I5022 Chronic systolic (congestive) heart failure: Secondary | ICD-10-CM | POA: Diagnosis not present

## 2016-11-22 DIAGNOSIS — R0603 Acute respiratory distress: Secondary | ICD-10-CM | POA: Diagnosis not present

## 2016-11-22 DIAGNOSIS — J90 Pleural effusion, not elsewhere classified: Secondary | ICD-10-CM | POA: Diagnosis not present

## 2016-11-22 DIAGNOSIS — R748 Abnormal levels of other serum enzymes: Secondary | ICD-10-CM | POA: Diagnosis present

## 2016-11-22 DIAGNOSIS — R630 Anorexia: Secondary | ICD-10-CM | POA: Diagnosis present

## 2016-11-22 DIAGNOSIS — N183 Chronic kidney disease, stage 3 (moderate): Secondary | ICD-10-CM | POA: Diagnosis present

## 2016-11-22 DIAGNOSIS — I8510 Secondary esophageal varices without bleeding: Secondary | ICD-10-CM | POA: Diagnosis present

## 2016-11-22 DIAGNOSIS — R1013 Epigastric pain: Secondary | ICD-10-CM | POA: Diagnosis not present

## 2016-11-22 DIAGNOSIS — K7581 Nonalcoholic steatohepatitis (NASH): Secondary | ICD-10-CM | POA: Diagnosis present

## 2016-11-22 DIAGNOSIS — I130 Hypertensive heart and chronic kidney disease with heart failure and stage 1 through stage 4 chronic kidney disease, or unspecified chronic kidney disease: Secondary | ICD-10-CM | POA: Diagnosis present

## 2016-11-22 DIAGNOSIS — I5023 Acute on chronic systolic (congestive) heart failure: Secondary | ICD-10-CM | POA: Diagnosis present

## 2016-11-22 DIAGNOSIS — D649 Anemia, unspecified: Secondary | ICD-10-CM | POA: Diagnosis present

## 2016-11-22 DIAGNOSIS — G4700 Insomnia, unspecified: Secondary | ICD-10-CM | POA: Diagnosis not present

## 2016-11-22 DIAGNOSIS — E785 Hyperlipidemia, unspecified: Secondary | ICD-10-CM | POA: Diagnosis present

## 2016-11-22 DIAGNOSIS — I4510 Unspecified right bundle-branch block: Secondary | ICD-10-CM | POA: Diagnosis present

## 2016-11-22 HISTORY — PX: CHOLECYSTECTOMY: SHX55

## 2016-11-22 LAB — CBC
HEMATOCRIT: 38.6 % — AB (ref 39.0–52.0)
HEMOGLOBIN: 12.6 g/dL — AB (ref 13.0–17.0)
MCH: 31.3 pg (ref 26.0–34.0)
MCHC: 32.6 g/dL (ref 30.0–36.0)
MCV: 96 fL (ref 78.0–100.0)
Platelets: 257 10*3/uL (ref 150–400)
RBC: 4.02 MIL/uL — AB (ref 4.22–5.81)
RDW: 15.1 % (ref 11.5–15.5)
WBC: 14.8 10*3/uL — ABNORMAL HIGH (ref 4.0–10.5)

## 2016-11-22 LAB — GLUCOSE, CAPILLARY
GLUCOSE-CAPILLARY: 138 mg/dL — AB (ref 65–99)
GLUCOSE-CAPILLARY: 124 mg/dL — AB (ref 65–99)
GLUCOSE-CAPILLARY: 149 mg/dL — AB (ref 65–99)
Glucose-Capillary: 144 mg/dL — ABNORMAL HIGH (ref 65–99)

## 2016-11-22 LAB — PROTIME-INR
INR: 1.28
Prothrombin Time: 16.1 seconds — ABNORMAL HIGH (ref 11.4–15.2)

## 2016-11-22 LAB — SURGICAL PCR SCREEN
MRSA, PCR: NEGATIVE
Staphylococcus aureus: NEGATIVE

## 2016-11-22 SURGERY — LAPAROSCOPIC CHOLECYSTECTOMY WITH INTRAOPERATIVE CHOLANGIOGRAM
Anesthesia: General | Site: Abdomen

## 2016-11-22 MED ORDER — DEXAMETHASONE SODIUM PHOSPHATE 10 MG/ML IJ SOLN
INTRAMUSCULAR | Status: AC
  Filled 2016-11-22: qty 3

## 2016-11-22 MED ORDER — OXYCODONE HCL 5 MG/5ML PO SOLN: 5.0000 mg | Freq: Once | ORAL | Status: DC | PRN

## 2016-11-22 MED ORDER — FENTANYL CITRATE (PF) 100 MCG/2ML IJ SOLN
INTRAMUSCULAR | Status: DC | PRN
  Administered 2016-11-22: 25 ug via INTRAVENOUS
  Administered 2016-11-22: 50 ug via INTRAVENOUS

## 2016-11-22 MED ORDER — ONDANSETRON HCL 4 MG/2ML IJ SOLN
INTRAMUSCULAR | Status: DC | PRN
  Administered 2016-11-22: 4 mg via INTRAVENOUS

## 2016-11-22 MED ORDER — ROCURONIUM BROMIDE 100 MG/10ML IV SOLN
INTRAVENOUS | Status: DC | PRN
  Administered 2016-11-22: 40 mg via INTRAVENOUS
  Administered 2016-11-22 (×2): 10 mg via INTRAVENOUS

## 2016-11-22 MED ORDER — OXYCODONE HCL 5 MG PO TABS
5.0000 mg | ORAL_TABLET | ORAL | Status: DC | PRN
  Administered 2016-11-23 – 2016-11-28 (×7): 5 mg via ORAL
  Filled 2016-11-22 (×7): qty 1

## 2016-11-22 MED ORDER — SUCCINYLCHOLINE CHLORIDE 200 MG/10ML IV SOSY
PREFILLED_SYRINGE | INTRAVENOUS | Status: AC
  Filled 2016-11-22: qty 10

## 2016-11-22 MED ORDER — ZOLPIDEM TARTRATE 5 MG PO TABS
5.0000 mg | ORAL_TABLET | Freq: Once | ORAL | Status: AC
  Administered 2016-11-23: 5 mg via ORAL
  Filled 2016-11-22: qty 1

## 2016-11-22 MED ORDER — OXYCODONE HCL 5 MG PO TABS: 5.0000 mg | ORAL_TABLET | Freq: Once | ORAL | Status: DC | PRN

## 2016-11-22 MED ORDER — EPHEDRINE 5 MG/ML INJ
INTRAVENOUS | Status: AC
  Filled 2016-11-22: qty 20

## 2016-11-22 MED ORDER — IOPAMIDOL (ISOVUE-300) INJECTION 61%
INTRAVENOUS | Status: AC
  Filled 2016-11-22: qty 50

## 2016-11-22 MED ORDER — 0.9 % SODIUM CHLORIDE (POUR BTL) OPTIME
TOPICAL | Status: DC | PRN
  Administered 2016-11-22: 1000 mL

## 2016-11-22 MED ORDER — DEXAMETHASONE SODIUM PHOSPHATE 10 MG/ML IJ SOLN
INTRAMUSCULAR | Status: DC | PRN
  Administered 2016-11-22: 5 mg via INTRAVENOUS

## 2016-11-22 MED ORDER — SODIUM CHLORIDE 0.9 % IR SOLN
Status: DC | PRN
  Administered 2016-11-22 (×3): 1000 mL

## 2016-11-22 MED ORDER — BUPIVACAINE-EPINEPHRINE 0.25% -1:200000 IJ SOLN
INTRAMUSCULAR | Status: DC | PRN
  Administered 2016-11-22: 20 mL

## 2016-11-22 MED ORDER — PROPOFOL 10 MG/ML IV BOLUS
INTRAVENOUS | Status: AC
  Filled 2016-11-22: qty 20

## 2016-11-22 MED ORDER — PROPOFOL 10 MG/ML IV BOLUS
INTRAVENOUS | Status: DC | PRN
  Administered 2016-11-22: 160 mg via INTRAVENOUS

## 2016-11-22 MED ORDER — SUGAMMADEX SODIUM 200 MG/2ML IV SOLN
INTRAVENOUS | Status: DC | PRN
  Administered 2016-11-22: 200 mg via INTRAVENOUS

## 2016-11-22 MED ORDER — LACTATED RINGERS IV SOLN
INTRAVENOUS | Status: DC
  Administered 2016-11-22 – 2016-11-23 (×3): via INTRAVENOUS

## 2016-11-22 MED ORDER — HYDROMORPHONE HCL 1 MG/ML IJ SOLN
0.2500 mg | INTRAMUSCULAR | Status: DC | PRN
  Administered 2016-11-22 (×2): 0.25 mg via INTRAVENOUS

## 2016-11-22 MED ORDER — ONDANSETRON HCL 4 MG/2ML IJ SOLN
INTRAMUSCULAR | Status: AC
  Filled 2016-11-22: qty 4

## 2016-11-22 MED ORDER — ZOLPIDEM TARTRATE 5 MG PO TABS: 5.0000 mg | ORAL_TABLET | Freq: Every evening | ORAL | Status: DC | PRN

## 2016-11-22 MED ORDER — HYDROMORPHONE HCL 1 MG/ML IJ SOLN
INTRAMUSCULAR | Status: AC
  Administered 2016-11-22: 0.25 mg via INTRAVENOUS
  Filled 2016-11-22: qty 1

## 2016-11-22 MED ORDER — BUPIVACAINE-EPINEPHRINE (PF) 0.25% -1:200000 IJ SOLN
INTRAMUSCULAR | Status: AC
  Filled 2016-11-22: qty 30

## 2016-11-22 MED ORDER — EPHEDRINE SULFATE 50 MG/ML IJ SOLN
INTRAMUSCULAR | Status: DC | PRN
  Administered 2016-11-22: 10 mg via INTRAVENOUS
  Administered 2016-11-22 (×2): 5 mg via INTRAVENOUS

## 2016-11-22 MED ORDER — FENTANYL CITRATE (PF) 250 MCG/5ML IJ SOLN
INTRAMUSCULAR | Status: AC
  Filled 2016-11-22: qty 5

## 2016-11-22 MED ORDER — MORPHINE SULFATE (PF) 2 MG/ML IV SOLN
1.0000 mg | INTRAVENOUS | Status: DC | PRN
  Administered 2016-11-22: 2 mg via INTRAVENOUS
  Administered 2016-11-22: 1 mg via INTRAVENOUS
  Administered 2016-11-22 – 2016-11-23 (×4): 4 mg via INTRAVENOUS
  Filled 2016-11-22: qty 1
  Filled 2016-11-22: qty 2
  Filled 2016-11-22: qty 1
  Filled 2016-11-22 (×2): qty 2
  Filled 2016-11-22: qty 1
  Filled 2016-11-22: qty 2

## 2016-11-22 MED ORDER — SUGAMMADEX SODIUM 200 MG/2ML IV SOLN
INTRAVENOUS | Status: AC
  Filled 2016-11-22: qty 2

## 2016-11-22 MED ORDER — LIDOCAINE HCL (CARDIAC) 20 MG/ML IV SOLN
INTRAVENOUS | Status: DC | PRN
  Administered 2016-11-22: 100 mg via INTRAVENOUS

## 2016-11-22 SURGICAL SUPPLY — 43 items
ADH SKN CLS APL DERMABOND .7 (GAUZE/BANDAGES/DRESSINGS) ×1
APPLIER CLIP 5 13 M/L LIGAMAX5 (MISCELLANEOUS) ×3
APR CLP MED LRG 5 ANG JAW (MISCELLANEOUS) ×1
BAG SPEC RTRVL LRG 6X4 10 (ENDOMECHANICALS) ×1
BIOPATCH RED 1 DISK 7.0 (GAUZE/BANDAGES/DRESSINGS) ×1 IMPLANT
BIOPATCH RED 1IN DISK 7.0MM (GAUZE/BANDAGES/DRESSINGS) ×1
BLADE CLIPPER SURG (BLADE) ×2 IMPLANT
CANISTER SUCT 3000ML PPV (MISCELLANEOUS) ×3 IMPLANT
CHLORAPREP W/TINT 26ML (MISCELLANEOUS) ×3 IMPLANT
CLIP APPLIE 5 13 M/L LIGAMAX5 (MISCELLANEOUS) ×1 IMPLANT
COVER MAYO STAND STRL (DRAPES) IMPLANT
COVER SURGICAL LIGHT HANDLE (MISCELLANEOUS) ×3 IMPLANT
DERMABOND ADVANCED (GAUZE/BANDAGES/DRESSINGS) ×2
DERMABOND ADVANCED .7 DNX12 (GAUZE/BANDAGES/DRESSINGS) ×1 IMPLANT
DRAIN CHANNEL 19F RND (DRAIN) ×2 IMPLANT
DRAPE C-ARM 42X72 X-RAY (DRAPES) IMPLANT
DRSG TEGADERM 2-3/8X2-3/4 SM (GAUZE/BANDAGES/DRESSINGS) ×4 IMPLANT
ELECT REM PT RETURN 9FT ADLT (ELECTROSURGICAL) ×3
ELECTRODE REM PT RTRN 9FT ADLT (ELECTROSURGICAL) ×1 IMPLANT
EVACUATOR SILICONE 100CC (DRAIN) ×2 IMPLANT
GLOVE SURG SIGNA 7.5 PF LTX (GLOVE) ×3 IMPLANT
GOWN STRL REUS W/ TWL LRG LVL3 (GOWN DISPOSABLE) ×2 IMPLANT
GOWN STRL REUS W/ TWL XL LVL3 (GOWN DISPOSABLE) ×1 IMPLANT
GOWN STRL REUS W/TWL LRG LVL3 (GOWN DISPOSABLE) ×3
GOWN STRL REUS W/TWL XL LVL3 (GOWN DISPOSABLE) ×6
KIT BASIN OR (CUSTOM PROCEDURE TRAY) ×3 IMPLANT
KIT ROOM TURNOVER OR (KITS) ×3 IMPLANT
NS IRRIG 1000ML POUR BTL (IV SOLUTION) ×3 IMPLANT
PAD ARMBOARD 7.5X6 YLW CONV (MISCELLANEOUS) ×3 IMPLANT
POUCH SPECIMEN RETRIEVAL 10MM (ENDOMECHANICALS) ×3 IMPLANT
SCISSORS LAP 5X35 DISP (ENDOMECHANICALS) ×3 IMPLANT
SET CHOLANGIOGRAPH 5 50 .035 (SET/KITS/TRAYS/PACK) IMPLANT
SET IRRIG TUBING LAPAROSCOPIC (IRRIGATION / IRRIGATOR) ×3 IMPLANT
SLEEVE ENDOPATH XCEL 5M (ENDOMECHANICALS) ×6 IMPLANT
SPECIMEN JAR SMALL (MISCELLANEOUS) ×3 IMPLANT
SUT ETHILON 2 0 FS 18 (SUTURE) ×2 IMPLANT
SUT MNCRL AB 4-0 PS2 18 (SUTURE) ×3 IMPLANT
SUT VICRYL 0 UR6 27IN ABS (SUTURE) ×2 IMPLANT
TOWEL OR 17X24 6PK STRL BLUE (TOWEL DISPOSABLE) ×3 IMPLANT
TRAY LAPAROSCOPIC MC (CUSTOM PROCEDURE TRAY) ×3 IMPLANT
TROCAR XCEL BLUNT TIP 100MML (ENDOMECHANICALS) ×3 IMPLANT
TROCAR XCEL NON-BLD 5MMX100MML (ENDOMECHANICALS) ×3 IMPLANT
TUBING INSUFFLATION (TUBING) ×3 IMPLANT

## 2016-11-22 NOTE — Progress Notes (Signed)
[facility]  PROGRESS NOTE  Dillon Andrews HYI:[id number] DOB: Feb 18, 1941 DOA: 11/19/2016 PCP: No primary care provider on file.   Brief HPI:   76 y.o. male with medical history significant of Systolic CHF with EF 78-67% appears to be NICM with only minimal non-occlusive CAD on cath in June of this year; Esophageal varices due to presumed non alcoholic cirrhosis of liver followed by GI Dr. Marin Comment; DM2, HTN, HLD, CKD stage 3 with baseline creat of 1.5; NSVT started on Amiodarone in June after heart cath, Amiodarone stopped during hospital stay this past week due to epigastric discomfort and nausea.  Patient was just hospitalized at [facility] this past week with acute on chronic systolic CHF exacerbation.  His breathing improved significantly with diuresis.  During that hospital stay he was noted to have a RML consolidation and R pleural effusion.  No fevers, no SIRS, completed course of ABx, pro calcitonin was negative.  Ultimately this was felt to be due to asymmetric pulmonary edema.  Patient was discharged from hospital yesterday, family didn't want him to go back to [facility].  Last evening he developed epigastric and RUQ abdominal pain and tenderness.  This has been intermittent and ongoing for past several months.  Associated nausea, no vomiting.  Subjective   Patient seen and examined, denies nausea but complains of right upper quadrant pain. He was seen by general surgery yesterday and is planned to undergo laparoscopic cholecystectomy for symptomatic cholelithiasis.   Assessment/Plan:     1. Nausea/epigastric pain- patient had negative GI emptying study during previous hospital stay, HIDA scan also was negative. Abdominal ultrasound shows cholelithiasis,But no cholecystitis patient does have history of elevated liver enzymes .GI was consulted and did recommend general surgery consultation for possible symptomatic cholelithiasis, General surgery saw the patient and planned take for laparoscopic  cholecystectomy today. 2. Elevated troponin- patient has mild elevation of troponin, cardiac cath in June showed minimal nonocclusive CAD. He denies chest pain. Cardiology was consulted, no plan for any aggressive intervention at this time. 3. ? Pneumonia- CT chest showed atelectasis versus pneumonia, minimal effusion. Status post thoracentesis at [facility] hospital. No antibiotics were initiated by admitting physician, I agree that patient does not appear to be having pneumonia at this time. he has been afebrile, normal vital signs. WBC 11.1  4. Chronic systolic CHF- stable, euvolemic. Continue furosemide 40 mg by mouth twice a day. 5. History of NSVT- amiodarone was  stopped due to intolerance during last hospital stay at [facility] hospital. 6. Diabetes mellitus- continue sliding scale insulin. 7. Hyperlipidemia- continue Crestor. 8. Hypertension-blood pressure is stable, continue metoprolol 9. CK D stage III- creatinine at  Baseline 1.56.    DVT prophylaxis: Heparin  Code Status: Full code  Family Communication: discussed with patient's daughter at bedside   Disposition Plan: Likely home in 1-2 days   Consultants:  None  Procedures:  None  Continuous infusions . cefTRIAXone (ROCEPHIN)  IV Stopped (11/21/16 1756)  . lactated ringers 50 mL/hr at 11/22/16 1217      Antibiotics:   Anti-infectives    Start     Dose/Rate Route Frequency Ordered Stop   11/21/16 1630  cefTRIAXone (ROCEPHIN) 2 g in dextrose 5 % 50 mL IVPB     2 g 100 mL/hr over 30 Minutes Intravenous Every 24 hours 11/21/16 1557     11/19/16 1515  ceFEPIme (MAXIPIME) 1 g in dextrose 5 % 50 mL IVPB     1 g 100 mL/hr over  30 Minutes Intravenous  Once 11/19/16 1503 11/19/16 1609   11/19/16 1515  vancomycin (VANCOCIN) IVPB 1000 mg/200 mL premix     1,000 mg 200 mL/hr over 60 Minutes Intravenous  Once 11/19/16 1503 11/19/16 1752   11/19/16 1511  ceFEPIme (MAXIPIME) 1 g injection    Comments:   [name]   : cabinet override      11/19/16 1511 11/20/16 0314       Objective   Vitals:   11/21/16 2009 11/22/16 0603 11/22/16 0812 11/22/16 0813  BP: 108/68 130/89 136/82   Pulse: 60 65  64  Resp: 16     Temp: 98.8 F (37.1 C) 98.8 F (37.1 C)    TempSrc: Oral Oral    SpO2: 92% 96%    Weight:  88.3 kg (194 lb 11.2 oz)    Height:        Intake/Output Summary (Last 24 hours) at 11/22/16 1443 Last data filed at 11/22/16 1440  Gross per 24 hour  Intake              725 ml  Output              900 ml  Net             -175 ml   Filed Weights   11/20/16 0604 11/21/16 0559 11/22/16 0603  Weight: 89.4 kg (197 lb 1.6 oz) 89.1 kg (196 lb 6.4 oz) 88.3 kg (194 lb 11.2 oz)     Physical Examination:  Physical Exam: Eyes: No icterus, extraocular muscles intact  Mouth: Oral mucosa is moist, no lesions on palate,  Neck: Supple, no deformities, masses, or tenderness Lungs: Normal respiratory effort, bilateral clear to auscultation, no crackles or wheezes.  Heart: Regular rate and rhythm, S1 and S2 normal, no murmurs, rubs auscultated Abdomen: BS normoactive,soft, positive right upper quadrant tenderness to palpation Extremities: No pretibial edema, no erythema, no cyanosis, no clubbing Neuro : Alert and oriented to time, place and person, No focal deficits Skin: No rashes seen on exam    Data Reviewed: I have personally reviewed following labs and imaging studies  CBG:  Recent Labs Lab 11/21/16 1229 11/21/16 1705 11/21/16 2118 11/22/16 0629 11/22/16 1145  GLUCAP 136* 138* 135* 138* 124*    CBC:  Recent Labs Lab 11/19/16 1326 11/20/16 0114 11/21/16 0249  WBC 10.0 7.9 11.1*  NEUTROABS 7.9*  --   --   HGB 14.3 13.4 14.0  HCT 43.0 41.5 42.6  MCV 97.5 96.7 97.3  PLT 180 166 470    Basic Metabolic Panel:  Recent Labs Lab 11/19/16 1326 11/20/16 0114 11/21/16 0249  NA 141 143 142  K 3.8 3.9 3.7  CL 100* 104 100*  CO2 30 29 29   GLUCOSE 101* 121*  133*  BUN 26* 21* 21*  CREATININE 1.69* 1.58* 1.56*  CALCIUM 8.6* 8.4* 8.7*    Recent Results (from the past 240 hour(s))  Surgical pcr screen     Status: None   Collection Time: 11/21/16  4:16 PM  Result Value Ref Range Status   MRSA, PCR NEGATIVE NEGATIVE Final   Staphylococcus aureus NEGATIVE NEGATIVE Final    Comment:        The Xpert SA Assay (FDA approved for NASAL specimens in patients over 58 years of age), is one component of a comprehensive surveillance program.  Test performance has been validated by [facility] for patients greater than or equal to 61 year old. It is not intended  to diagnose infection nor to guide or monitor treatment.      Liver Function Tests:  Recent Labs Lab 11/19/16 1326 11/20/16 0114 11/21/16 0249  AST 64* 52* 57*  ALT 61 50 56  ALKPHOS 208* 194* 217*  BILITOT 1.2 1.1 1.4*  PROT 6.4* 5.4* 6.1*  ALBUMIN 3.2* 2.7* 3.1*    Recent Labs Lab 11/19/16 1326  LIPASE 35   No results for input(s): AMMONIA in the last 168 hours.  Cardiac Enzymes:  Recent Labs Lab 11/19/16 1326 11/19/16 2057 11/20/16 0114 11/20/16 0738  TROPONINI 0.23* 0.29* 0.31* 0.31*   BNP (last 3 results)  Recent Labs  11/19/16 1326  BNP 439.5*    ProBNP (last 3 results) No results for input(s): PROBNP in the last 8760 hours.    Studies: No results found.  Scheduled Meds: . [MAR Hold] aspirin  81 mg Oral Daily  . [MAR Hold] furosemide  40 mg Oral BID  . [MAR Hold] gabapentin  100 mg Oral QHS  . [MAR Hold] insulin aspart  0-9 Units Subcutaneous TID WC  . [MAR Hold] metoprolol succinate  25 mg Oral Daily  . [MAR Hold] pantoprazole  80 mg Oral Daily  . [MAR Hold] potassium chloride  20 mEq Oral Daily  . [MAR Hold] rosuvastatin  10 mg Oral Daily     Time spent: 25 min  [facility] Pager [phone]. If 7PM-7AM, please contact night-coverage at [email or web address], Office  [phone]  password [email or web address]  11/22/2016, 2:43 PM   LOS: 2 days

## 2016-11-22 NOTE — Anesthesia Procedure Notes (Signed)
Procedure Name: Intubation Date/Time: 11/22/2016 1:26 PM Performed by: Rejeana Brock L Pre-anesthesia Checklist: Patient identified, Emergency Drugs available, Suction available and Patient being monitored Patient Re-evaluated:Patient Re-evaluated prior to induction Oxygen Delivery Method: Circle System Utilized Preoxygenation: Pre-oxygenation with 100% oxygen Induction Type: IV induction Ventilation: Mask ventilation without difficulty Laryngoscope Size: Miller and 2 Grade View: Grade I Tube type: Oral Tube size: 7.5 mm Number of attempts: 1 Airway Equipment and Method: Stylet and Oral airway Placement Confirmation: ETT inserted through vocal cords under direct vision,  positive ETCO2 and breath sounds checked- equal and bilateral Secured at: 22 cm Tube secured with: Tape Dental Injury: Teeth and Oropharynx as per pre-operative assessment

## 2016-11-22 NOTE — Progress Notes (Signed)
Paged MD to inform that pt has fluids going at 24m/hr, he wants the fluids to continue at that rate.Will continue to  monitor pt for signs of CHF.

## 2016-11-22 NOTE — Care Management CC44 (Signed)
Condition Code 44 Documentation Completed  Patient Details  Name: Dillon Andrews MRN: [id number] Date of Birth: 02-26-1941   Condition Code 44 given:  Yes Patient signature on Condition Code 44 notice:  Yes Documentation of 2 MD's agreement:  Yes Code 44 added to claim:  Yes    Salia Cangemi, Rory Percy, RN 11/22/2016, 10:37 AM

## 2016-11-22 NOTE — Progress Notes (Signed)
Patient ID: Dillon Andrews, male   DOB: [birth year], 76 y.o.   MRN: [id number] Hb 12.6 Inr 1.28 JP drainage slowed way down. CBC at 0500. Abd soft. I again spoke with him and his daughter. He often takes Ambien PRN insomnia and he C/O this now - will order.  Patient examined and I agree with the assessment and plan  Georganna Skeans, MD, MPH, FACS Trauma: [phone] General Surgery: [phone]  11/22/2016 11:28 PM

## 2016-11-22 NOTE — Transfer of Care (Signed)
Immediate Anesthesia Transfer of Care Note  Patient: Dillon Andrews  Procedure(s) Performed: Procedure(s): LAPAROSCOPIC CHOLECYSTECTOMY (N/A)  Patient Location: PACU  Anesthesia Type:General  Level of Consciousness: awake, alert  and oriented  Airway & Oxygen Therapy: Patient Spontanous Breathing and Patient connected to nasal cannula oxygen  Post-op Assessment: Report given to RN, Post -op Vital signs reviewed and stable and Patient moving all extremities X 4  Post vital signs: Reviewed and stable  Last Vitals:  Vitals:   11/22/16 0813 11/22/16 1456  BP:    Pulse: 64   Resp:    Temp:  (P) 37.1 C  SpO2:      Last Pain:  Vitals:   11/22/16 1456  TempSrc:   PainSc: (P) 0-No pain      Patients Stated Pain Goal: 0 (76/18/48 5927)  Complications: No apparent anesthesia complications

## 2016-11-22 NOTE — Op Note (Signed)
LAPAROSCOPIC CHOLECYSTECTOMY  Procedure Note  Dillon Andrews 11/19/2016 - 11/22/2016   Pre-op Diagnosis: symptomatic gallstones     Post-op Diagnosis: acute and chronic cholecystitis with cholelithiasis  Procedure(s): LAPAROSCOPIC CHOLECYSTECTOMY  Surgeon(s): Coralie Keens, MD  Anesthesia: General  Staff:  Circulator: Tereasa Coop, RN Radiology Technologist: Gordy Levan, RT Scrub Person: Barrett, Emi Holes, Kaitlyn M  Estimated Blood Loss: 200 mL               Specimens: sent to path  Drains:  19 Fr blake drain  Findings: The patient was found to have an acutely inflamed, necrotic gallbladder with colon and omentum as well as stomach adherent to the gallbladder. The cystic stump was also necrotic.  Procedure: The patient was brought to the operating room and identified as correct patient. He was placed supine on the operating table and general anesthesia was induced. His abdomen was then prepped and draped in the usual sterile fashion. I made a small incision just below the umbilicus with a scalpel. I took this down to the fascia which was opened with a scalpel as well. A hemostat was then used to pass into the peritoneal cavity under direct vision. A 0 Vicryl purse string suture was placed around the fascial opening. The [facility] port was placed through the opening and insufflation of the abdomen was begun. A 5 mm trocar was then placed the patient's epigastrium and 2 more in the right upper quadrant all under direct vision. The gallbladder was covered with omentum area ablated peel some of the omentum away. I then found transverse colon and stomach stuck to the gallbladder which I was able to dissect free has well. The gallbladder was acutely inflamed and distended. Once I grasped the gallbladder, it easily tore apart. I was able to finally elevate the gallbladder and dissecting down to the cystic duct. The cystic artery was slightly anterior. I clipped it 3 times  proximally and once distally and transected. I then clipped the cystic artery distally. It incompletely or as it was quite necrotic and I was unable to perform a cholangiogram. I was able to place 2 clips on the cystic stump. I then slowly dissected free the gallbladder from the liver bed with the electrocautery. I examined the stomach and omentum and colon and saw no evidence of bowel injury. A small bleeding vessel in the omentum right The colon had to be clipped with surgical clips to achieve hemostasis. I irrigated the abdomen with normal saline. I had to place a separate 5 mm trocar to aid with dissection. I then placed a 30 [place] Blake drain through the epigastric trocar and pulled out of the lateral trocar. I placed a drain into the gallbladder fossa and then sutured it in place with a 2-0 nylon suture. I irrigated the abdomen further. Again, hemostasis appeared to be achieved. The gallbladder was placed in an Endosac and removed through the incision at the umbilicus. 0 Vicryl of them blocks was then tied in place closed the fascial defect. All incisions were then anesthetized with Marcaine and closed with 4-0 Monocryl subcuticularsutures. Skin glue was then applied.  The patient tolerated the procedure well. All counts were correct at the end of the procedure. The patient was then extubated in the operating room and taken in a stable condition to the recovery room.          Demichael Traum A   Date: 11/22/2016  Time: 2:44 PM

## 2016-11-22 NOTE — Care Management Obs Status (Signed)
[facility] NOTIFICATION   Patient Details  Name: Dillon Andrews MRN: [id number] Date of Birth: 1940/10/18   Medicare Observation Status Notification Given:  Yes    Felicitas Sine, Rory Percy, RN 11/22/2016, 10:36 AM

## 2016-11-22 NOTE — Anesthesia Preprocedure Evaluation (Signed)
Anesthesia Evaluation  Patient identified by MRN, date of birth, ID band Patient awake    Reviewed: Allergy & Precautions, NPO status   Airway Mallampati: II  TM Distance: >3 FB     Dental no notable dental hx.    Pulmonary shortness of breath, former smoker,    breath sounds clear to auscultation       Cardiovascular hypertension,  Rhythm:Regular Rate:Normal     Neuro/Psych negative neurological ROS     GI/Hepatic   Endo/Other  diabetes  Renal/GU Renal InsufficiencyRenal disease     Musculoskeletal   Abdominal   Peds  Hematology negative hematology ROS (+)   Anesthesia Other Findings   Reproductive/Obstetrics                             Anesthesia Physical Anesthesia Plan  ASA: II  Anesthesia Plan: General   Post-op Pain Management:    Induction: Intravenous  PONV Risk Score and Plan: 4 or greater and Ondansetron, Dexamethasone, Midazolam, Scopolamine patch - Pre-op and Treatment may vary due to age or medical condition  Airway Management Planned: Oral ETT  Additional Equipment:   Intra-op Plan:   Post-operative Plan: Extubation in OR  Informed Consent: I have reviewed the patients History and Physical, chart, labs and discussed the procedure including the risks, benefits and alternatives for the proposed anesthesia with the patient or authorized representative who has indicated his/her understanding and acceptance.   Dental advisory given  Plan Discussed with: CRNA  Anesthesia Plan Comments:         Anesthesia Quick Evaluation

## 2016-11-22 NOTE — Progress Notes (Signed)
Patient ID: Dillon Andrews, male   DOB: May 03, 1940, 76 y.o.   MRN: [id number]  Called by RN for JP output 405cc/2h. BP and HR OK. On exam, JP output is definitely on the sanguinous side of serosanguinous. I stripped the drain and output seems to have stopped.  Stat CBC and PT INR. Will follow closely. And I will see him again after the labs are back. I spoke with him and his daughter. He consents to blood transfusion if needed.  Georganna Skeans, MD, MPH, FACS Trauma: [phone] General Surgery: [phone]

## 2016-11-22 NOTE — Progress Notes (Signed)
Patient asleep resting well. Plan of care regarding surgery discussed earlier in the shift. Patient verbalized understanding of plan of care.

## 2016-11-22 NOTE — Care Management CC44 (Signed)
Condition Code 44 Documentation Completed  Patient Details  Name: Dillon Andrews MRN: [id number] Date of Birth: 1940-12-23   Condition Code 44 given:  Yes Patient signature on Condition Code 44 notice:  Yes Documentation of 2 MD's agreement:  Yes Code 44 added to claim:  Yes    Rubert Frediani, Rory Percy, RN 11/22/2016, 10:37 AM

## 2016-11-22 NOTE — Discharge Instructions (Addendum)
Please arrive at least 30 min before your appointment to complete your check in paperwork.  If you are unable to arrive 30 min prior to your appointment time we may have to cancel or reschedule you.  LAPAROSCOPIC SURGERY: POST OP INSTRUCTIONS  1. DIET: Follow a light bland diet the first 24 hours after arrival home, such as soup, liquids, crackers, etc. Be sure to include lots of fluids daily. Avoid fast food or heavy meals as your are more likely to get nauseated. Eat a low fat the next few days after surgery.  2. Take your usually prescribed home medications unless otherwise directed. 3. PAIN CONTROL:  1. Pain is best controlled by a usual combination of three different methods TOGETHER:  1. Ice/Heat 2. Over the counter pain medication 3. Prescription pain medication 2. Most patients will experience some swelling and bruising around the incisions. Ice packs or heating pads (30-60 minutes up to 6 times a day) will help. Use ice for the first few days to help decrease swelling and bruising, then switch to heat to help relax tight/sore spots and speed recovery. Some people prefer to use ice alone, heat alone, alternating between ice & heat. Experiment to what works for you. Swelling and bruising can take several weeks to resolve.  3. It is helpful to take an over-the-counter pain medication regularly for the first few weeks. Choose one of the following that works best for you:  1. Naproxen (Aleve, etc) Two 216m tabs twice a day 2. Ibuprofen (Advil, etc) Three 201mtabs four times a day (every meal & bedtime) 3. Acetaminophen (Tylenol, etc) 500-65038mour times a day (every meal & bedtime) 4. A prescription for pain medication (such as oxycodone, hydrocodone, etc) should be given to you upon discharge. Take your pain medication as prescribed.  1. If you are having problems/concerns with the prescription medicine (does not control pain, nausea, vomiting, rash, itching, etc), please call us [phone])  [phone] to see if we need to switch you to a different pain medicine that will work better for you and/or control your side effect better. 2. If you need a refill on your pain medication, please contact your pharmacy. They will contact our office to request authorization. Prescriptions will not be filled after 5 pm or on week-ends. 4. Avoid getting constipated. Between the surgery and the pain medications, it is common to experience some constipation. Increasing fluid intake and taking a fiber supplement (such as Metamucil, Citrucel, FiberCon, MiraLax, etc) 1-2 times a day regularly will usually help prevent this problem from occurring. A mild laxative (prune juice, Milk of Magnesia, MiraLax, etc) should be taken according to package directions if there are no bowel movements after 48 hours.  5. Watch out for diarrhea. If you have many loose bowel movements, simplify your diet to bland foods & liquids for a few days. Stop any stool softeners and decrease your fiber supplement. Switching to mild anti-diarrheal medications (Kayopectate, Pepto Bismol) can help. If this worsens or does not improve, please call us.[place]. Wash / shower every day. You may shower over the dressings as they are waterproof. Continue to shower over incision(s) after the dressing is off. If there is glue over the incisions try not to pick it off, let it fall off naturally. 7. Remove your waterproof bandages 2 days after surgery. You may leave the incision open to air. You may replace a dressing/Band-Aid to cover the incision for comfort if you wish.  8. ACTIVITIES as tolerated:  1. You may resume regular (light) daily activities beginning the next day--such as daily self-care, walking, climbing stairs--gradually increasing activities as tolerated. If you can walk 30 minutes without difficulty, it is safe to try more intense activity such as jogging, treadmill, bicycling, low-impact aerobics, swimming, etc. 2. Save the most intensive and  strenuous activity for last such as sit-ups, heavy lifting, contact sports, etc Refrain from any heavy lifting or straining until you are off narcotics for pain control. For the first 2-3 weeks do not lift over 10-15lb.  3. DO NOT PUSH THROUGH PAIN. Let pain be your guide: If it hurts to do something, don't do it. Pain is your body warning you to avoid that activity for another week until the pain goes down. 4. You may drive when you are no longer taking prescription pain medication, you can comfortably wear a seatbelt, and you can safely maneuver your car and apply brakes. 5. You may have sexual intercourse when it is comfortable.  9. FOLLOW UP in our office  1. Please call [facility] at [phone] to set up an appointment to see your surgeon in the office for a follow-up appointment approximately 2-3 weeks after your surgery. 2. Make sure that you call for this appointment the day you arrive home to insure a convenient appointment time.      10. IF YOU HAVE DISABILITY OR FAMILY LEAVE FORMS, BRING THEM TO THE               OFFICE FOR PROCESSING.   WHEN TO CALL us [phone]:  1. Poor pain control 2. Reactions / problems with new medications (rash/itching, nausea, etc)  3. Fever over 101.5 F (38.5 C) 4. Inability to urinate 5. Nausea and/or vomiting 6. Worsening swelling or bruising 7. Continued bleeding from incision. 8. Increased pain, redness, or drainage from the incision  The clinic staff is available to answer your questions during regular business hours (8:30am-5pm). Please dont hesitate to call and ask to speak to one of our nurses for clinical concerns.  If you have a medical emergency, go to the nearest emergency room or call 911.  A surgeon from [facility] is always on call at the [facility], [address], [place], [place], [place] [address] ?  MAIN: [phone] ? TOLL FREE: [phone] ?  FAX [phone]    [email or web address]    Heart-Healthy Eating Plan Heart-healthy meal planning includes:  Limiting unhealthy fats.  Increasing healthy fats.  Making other small dietary changes.  You may need to talk with your doctor or a diet specialist (dietitian) to create an eating plan that is right for you. What types of fat should I choose?  Choose healthy fats. These include olive oil and canola oil, flaxseeds, walnuts, almonds, and seeds.  Eat more omega-3 fats. These include salmon, mackerel, sardines, tuna, flaxseed oil, and ground flaxseeds. Try to eat fish at least twice each week.  Limit saturated fats. ? Saturated fats are often found in animal products, such as meats, butter, and cream. ? Plant sources of saturated fats include palm oil, palm kernel oil, and coconut oil.  Avoid foods with partially hydrogenated oils in them. These include stick margarine, some tub margarines, cookies, crackers, and other baked goods. These contain trans fats. What general guidelines do I need to follow?  Check food labels carefully. Identify foods with trans fats or high amounts of saturated fat.  Fill one half of  your plate with vegetables and green salads. Eat 4-5 servings of vegetables per day. A serving of vegetables is: ? 1 cup of raw leafy vegetables. ?  cup of raw or cooked cut-up vegetables. ?  cup of vegetable juice.  Fill one fourth of your plate with whole grains. Look for the word "whole" as the first word in the ingredient list.  Fill one fourth of your plate with lean protein foods.  Eat 4-5 servings of fruit per day. A serving of fruit is: ? One medium whole fruit. ?  cup of dried fruit. ?  cup of fresh, frozen, or canned fruit. ?  cup of 100% fruit juice.  Eat more foods that contain soluble fiber. These include apples, broccoli, carrots, beans, peas, and barley. Try to get 20-30 g of fiber per day.  Eat more home-cooked food. Eat less restaurant, buffet,  and fast food.  Limit or avoid alcohol.  Limit foods high in starch and sugar.  Avoid fried foods.  Avoid frying your food. Try baking, boiling, grilling, or broiling it instead. You can also reduce fat by: ? Removing the skin from poultry. ? Removing all visible fats from meats. ? Skimming the fat off of stews, soups, and gravies before serving them. ? Steaming vegetables in water or broth.  Lose weight if you are overweight.  Eat 4-5 servings of nuts, legumes, and seeds per week: ? One serving of dried beans or legumes equals  cup after being cooked. ? One serving of nuts equals 1 ounces. ? One serving of seeds equals  ounce or one tablespoon.  You may need to keep track of how much salt or sodium you eat. This is especially true if you have high blood pressure. Talk with your doctor or dietitian to get more information. What foods can I eat? Grains Breads, including [place], white, pita, wheat, raisin, rye, oatmeal, and [place]. Tortillas that are neither fried nor made with lard or trans fat. Low-fat rolls, including hotdog and hamburger buns and English muffins. Biscuits. Muffins. Waffles. Pancakes. Light popcorn. Whole-grain cereals. Flatbread. Melba toast. Pretzels. Breadsticks. Rusks. Low-fat snacks. Low-fat crackers, including oyster, saltine, matzo, graham, animal, and rye. Rice and pasta, including brown rice and pastas that are made with whole wheat. Vegetables All vegetables. Fruits All fruits, but limit coconut. Meats and Other Protein Sources Lean, well-trimmed beef, veal, pork, and lamb. Chicken and [place] without skin. All fish and shellfish. Wild duck, rabbit, pheasant, and venison. Egg whites or low-cholesterol egg substitutes. Dried beans, peas, lentils, and tofu. Seeds and most nuts. Dairy Low-fat or nonfat cheeses, including ricotta, string, and mozzarella. Skim or 1% milk that is liquid, powdered, or evaporated. Buttermilk that is made with low-fat milk.  Nonfat or low-fat yogurt. Beverages Mineral water. Diet carbonated beverages. Sweets and Desserts Sherbets and fruit ices. Honey, jam, marmalade, jelly, and syrups. Meringues and gelatins. Pure sugar candy, such as hard candy, jelly beans, gumdrops, mints, marshmallows, and small amounts of dark chocolate. [employer] Eat all sweets and desserts in moderation. Fats and Oils Nonhydrogenated (trans-free) margarines. Vegetable oils, including soybean, sesame, sunflower, olive, peanut, safflower, corn, canola, and cottonseed. Salad dressings or mayonnaise made with a vegetable oil. Limit added fats and oils that you use for cooking, baking, salads, and as spreads. Other Cocoa powder. Coffee and tea. All seasonings and condiments. The items listed above may not be a complete list of recommended foods or beverages. Contact your dietitian for more options. What foods are not  recommended? Grains Breads that are made with saturated or trans fats, oils, or whole milk. Croissants. Butter rolls. Cheese breads. Sweet rolls. Donuts. Buttered popcorn. Chow mein noodles. High-fat crackers, such as cheese or butter crackers. Meats and Other Protein Sources Fatty meats, such as hotdogs, short ribs, sausage, spareribs, bacon, rib eye roast or steak, and mutton. High-fat deli meats, such as salami and bologna. Caviar. Domestic duck and goose. Organ meats, such as kidney, liver, sweetbreads, and heart. Dairy Cream, sour cream, cream cheese, and creamed cottage cheese. Whole-milk cheeses, including blue (bleu), Monte[place] Jack, [place], [place], American, [place], Swiss, cheddar, [place], and [place]. Whole or 2% milk that is liquid, evaporated, or condensed. Whole buttermilk. Cream sauce or high-fat cheese sauce. Yogurt that is made from whole milk. Beverages Regular sodas and juice drinks with added sugar. Sweets and Desserts Frosting. Pudding. Cookies. Cakes other than angel food cake. Candy that has milk  chocolate or white chocolate, hydrogenated fat, butter, coconut, or unknown ingredients. Buttered syrups. Full-fat ice cream or ice cream drinks. Fats and Oils Gravy that has suet, meat fat, or shortening. Cocoa butter, hydrogenated oils, palm oil, coconut oil, palm kernel oil. These can often be found in baked products, candy, fried foods, nondairy creamers, and whipped toppings. Solid fats and shortenings, including bacon fat, salt pork, lard, and butter. Nondairy cream substitutes, such as coffee creamers and sour cream substitutes. Salad dressings that are made of unknown oils, cheese, or sour cream. The items listed above may not be a complete list of foods and beverages to avoid. Contact your dietitian for more information. This information is not intended to replace advice given to you by your health care provider. Make sure you discuss any questions you have with your health care provider. Document Released: 10/01/2011 Document Revised: 09/07/2015 Document Reviewed: 09/23/2013 Elsevier Interactive Patient Education  2018 [employer].    Laparoscopic Cholecystectomy, Care After This sheet gives you information about how to care for yourself after your procedure. Your doctor may also give you more specific instructions. If you have problems or questions, contact your doctor. Follow these instructions at home: Care for cuts from surgery (incisions)   Follow instructions from your doctor about how to take care of your cuts from surgery. Make sure you: ? Wash your hands with soap and water before you change your bandage (dressing). If you cannot use soap and water, use hand sanitizer. ? Change your bandage as told by your doctor. ? Leave stitches (sutures), skin glue, or skin tape (adhesive) strips in place. They may need to stay in place for 2 weeks or longer. If tape strips get loose and curl up, you may trim the loose edges. Do not remove tape strips completely unless your doctor says it  is okay.  Do not take baths, swim, or use a hot tub until your doctor says it is okay. Ask your doctor if you can take showers. You may only be allowed to take sponge baths for bathing.  Check your surgical cut area every day for signs of infection. Check for: ? More redness, swelling, or pain. ? More fluid or blood. ? Warmth. ? Pus or a bad smell. Activity  Do not drive or use heavy machinery while taking prescription pain medicine.  Do not lift anything that is heavier than 10 lb (4.5 kg) until your doctor says it is okay.  Do not play contact sports until your doctor says it is okay.  Do not drive for 24 hours if you  were given a medicine to help you relax (sedative).  Rest as needed. Do not return to work or school until your doctor says it is okay. General instructions  Take over-the-counter and prescription medicines only as told by your doctor.  To prevent or treat constipation while you are taking prescription pain medicine, your doctor may recommend that you: ? Drink enough fluid to keep your pee (urine) clear or pale yellow. ? Take over-the-counter or prescription medicines. ? Eat foods that are high in fiber, such as fresh fruits and vegetables, whole grains, and beans. ? Limit foods that are high in fat and processed sugars, such as fried and sweet foods. Contact a doctor if:  You develop a rash.  You have more redness, swelling, or pain around your surgical cuts.  You have more fluid or blood coming from your surgical cuts.  Your surgical cuts feel warm to the touch.  You have pus or a bad smell coming from your surgical cuts.  You have a fever.  One or more of your surgical cuts breaks open. Get help right away if:  You have trouble breathing.  You have chest pain.  You have pain that is getting worse in your shoulders.  You faint or feel dizzy when you stand.  You have very bad pain in your belly (abdomen).  You are sick to your stomach  (nauseous) for more than one day.  You have throwing up (vomiting) that lasts for more than one day.  You have leg pain. This information is not intended to replace advice given to you by your health care provider. Make sure you discuss any questions you have with your health care provider. Document Released: 01/09/2008 Document Revised: 10/21/2015 Document Reviewed: 09/18/2015 Elsevier Interactive Patient Education  2017 [employer].

## 2016-11-23 DIAGNOSIS — K819 Cholecystitis, unspecified: Secondary | ICD-10-CM

## 2016-11-23 LAB — COMPREHENSIVE METABOLIC PANEL
ALT: 47 U/L (ref 17–63)
ANION GAP: 11 (ref 5–15)
AST: 63 U/L — ABNORMAL HIGH (ref 15–41)
Albumin: 2.5 g/dL — ABNORMAL LOW (ref 3.5–5.0)
Alkaline Phosphatase: 157 U/L — ABNORMAL HIGH (ref 38–126)
BUN: 29 mg/dL — ABNORMAL HIGH (ref 6–20)
CHLORIDE: 98 mmol/L — AB (ref 101–111)
CO2: 29 mmol/L (ref 22–32)
CREATININE: 1.82 mg/dL — AB (ref 0.61–1.24)
Calcium: 8.5 mg/dL — ABNORMAL LOW (ref 8.9–10.3)
GFR calc non Af Amer: 34 mL/min — ABNORMAL LOW (ref >60)
GFR, EST AFRICAN AMERICAN: 40 mL/min — AB (ref >60)
Glucose, Bld: 163 mg/dL — ABNORMAL HIGH (ref 65–99)
POTASSIUM: 4.3 mmol/L (ref 3.5–5.1)
Sodium: 138 mmol/L (ref 135–145)
Total Bilirubin: 1.4 mg/dL — ABNORMAL HIGH (ref 0.3–1.2)
Total Protein: 5.5 g/dL — ABNORMAL LOW (ref 6.5–8.1)

## 2016-11-23 LAB — GLUCOSE, CAPILLARY
GLUCOSE-CAPILLARY: 129 mg/dL — AB (ref 65–99)
Glucose-Capillary: 190 mg/dL — ABNORMAL HIGH (ref 65–99)
Glucose-Capillary: 176 mg/dL — ABNORMAL HIGH (ref 65–99)
Glucose-Capillary: 183 mg/dL — ABNORMAL HIGH (ref 65–99)

## 2016-11-23 LAB — CBC
HCT: 34.5 % — ABNORMAL LOW (ref 39.0–52.0)
HEMATOCRIT: 32.5 % — AB (ref 39.0–52.0)
Hemoglobin: 11.7 g/dL — ABNORMAL LOW (ref 13.0–17.0)
Hemoglobin: 10.8 g/dL — ABNORMAL LOW (ref 13.0–17.0)
MCH: 32.5 pg (ref 26.0–34.0)
MCH: 31.8 pg (ref 26.0–34.0)
MCHC: 33.9 g/dL (ref 30.0–36.0)
MCHC: 33.2 g/dL (ref 30.0–36.0)
MCV: 95.8 fL (ref 78.0–100.0)
MCV: 95.6 fL (ref 78.0–100.0)
PLATELETS: 237 10*3/uL (ref 150–400)
PLATELETS: 261 10*3/uL (ref 150–400)
RBC: 3.6 MIL/uL — ABNORMAL LOW (ref 4.22–5.81)
RBC: 3.4 MIL/uL — ABNORMAL LOW (ref 4.22–5.81)
RDW: 15.4 % (ref 11.5–15.5)
RDW: 15.4 % (ref 11.5–15.5)
WBC: 15.3 10*3/uL — ABNORMAL HIGH (ref 4.0–10.5)
WBC: 16.1 10*3/uL — ABNORMAL HIGH (ref 4.0–10.5)

## 2016-11-23 NOTE — Progress Notes (Signed)
1 Day Post-Op   Subjective/Chief Complaint: Comfortable this morning Had Andrews lot of drain output   Objective: Vital signs in last 24 hours: Temp:  [97.2 F (36.2 C)-98.7 F (37.1 C)] 98 F (36.7 C) (08/11 0515) Pulse Rate:  [64-77] 77 (08/11 0515) Resp:  [17-26] 18 (08/11 0515) BP: (112-143)/(69-93) 112/70 (08/11 0515) SpO2:  [92 %-95 %] 95 % (08/11 0515) Weight:  [86.7 kg (191 lb 3.2 oz)] 86.7 kg (191 lb 3.2 oz) (08/11 0515) Last BM Date: 11/21/16  Intake/Output from previous day: 08/10 0701 - 08/11 0700 In: 1835 [P.O.:240; I.V.:500] Out: 735 [Urine:625; Drains:60; Blood:50] Intake/Output this shift: Total I/O In: 130 [Other:130] Out: -   Exam: Looks comfortable Abdomen soft, drain serosanguinous  without bile  Lab Results:   Recent Labs  11/22/16 2220 11/23/16 0556  WBC 14.8* 15.3*  HGB 12.6* 11.7*  HCT 38.6* 34.5*  PLT 257 237   BMET  Recent Labs  11/21/16 0249 11/23/16 0556  NA 142 138  K 3.7 4.3  CL 100* 98*  CO2 29 29  GLUCOSE 133* 163*  BUN 21* 29*  CREATININE 1.56* 1.82*  CALCIUM 8.7* 8.5*   PT/INR  Recent Labs  11/22/16 2220  LABPROT 16.1*  INR 1.28   ABG No results for input(s): PHART, HCO3 in the last 72 hours.  Invalid input(s): PCO2, PO2  Studies/Results: No results found.  Anti-infectives: Anti-infectives    Start     Dose/Rate Route Frequency Ordered Stop   11/21/16 1630  cefTRIAXone (ROCEPHIN) 2 g in dextrose 5 % 50 mL IVPB  Status:  Discontinued     2 g 100 mL/hr over 30 Minutes Intravenous Every 24 hours 11/21/16 1557 11/22/16 1532   11/19/16 1515  ceFEPIme (MAXIPIME) 1 g in dextrose 5 % 50 mL IVPB     1 g 100 mL/hr over 30 Minutes Intravenous  Once 11/19/16 1503 11/19/16 1609   11/19/16 1515  vancomycin (VANCOCIN) IVPB 1000 mg/200 mL premix     1,000 mg 200 mL/hr over 60 Minutes Intravenous  Once 11/19/16 1503 11/19/16 1752   11/19/16 1511  ceFEPIme (MAXIPIME) 1 g injection    Comments:  Dillon Andrews   :  cabinet override      11/19/16 1511 11/20/16 0314      Assessment/Plan: s/p Procedure(s): LAPAROSCOPIC CHOLECYSTECTOMY (N/Andrews)  Suspect most of the fluid is from irrigation as his hemoglobin has not dropped significantly.  However, it was Andrews necrotic liver with Andrews lot of intra-op bleeding. Would hold lovenox/heparin.  Repeat labs later today Try full liquids. He had Andrews necrotic cystic stump so is at risk for bile leak  LOS: 3 days    Dillon Andrews 11/23/2016

## 2016-11-23 NOTE — Progress Notes (Signed)
[facility]  PROGRESS NOTE  Dillon Andrews RJJ:[id number] DOB: 05-27-1940 DOA: 11/19/2016 PCP: No primary care provider on file.   Brief HPI:   76 y.o. male with medical history significant of Systolic CHF with EF 01-60% appears to be NICM with only minimal non-occlusive CAD on cath in June of this year; Esophageal varices due to presumed non alcoholic cirrhosis of liver followed by GI Dr. Marin Comment; DM2, HTN, HLD, CKD stage 3 with baseline creat of 1.5; NSVT started on Amiodarone in June after heart cath, Amiodarone stopped during hospital stay this past week due to epigastric discomfort and nausea.  Patient was just hospitalized at [facility] this past week with acute on chronic systolic CHF exacerbation.  His breathing improved significantly with diuresis.  During that hospital stay he was noted to have a RML consolidation and R pleural effusion.  No fevers, no SIRS, completed course of ABx, pro calcitonin was negative.  Ultimately this was felt to be due to asymmetric pulmonary edema.  Patient was discharged from hospital yesterday, family didn't want him to go back to [facility].  Last evening he developed epigastric and RUQ abdominal pain and tenderness.  This has been intermittent and ongoing for past several months.  Associated nausea, no vomiting.  Subjective   Patient seen and examined, status post laparoscopic cholecystectomy for symptomatic cholelithiasis. Denies pain, no nausea vomiting.   Assessment/Plan:     1. Status post laparoscopic cholecystectomy- patient presented with nausea/epigastric pain- he  had negative GI emptying study during previous hospital stay, HIDA scan also was negative. Abdominal ultrasound showed cholelithiasis,But no cholecystitis patient does have history of elevated liver enzymes .GI was consulted and did recommend general surgery consultation for possible symptomatic cholelithiasis, General surgery saw the patient and patient underwent laparoscopic  cholecystectomy. 2. Elevated troponin- patient has mild elevation of troponin, cardiac cath in June showed minimal nonocclusive CAD. He denies chest pain. Cardiology was consulted, no plan for any aggressive intervention at this time. 3. ? Pneumonia- CT chest showed atelectasis versus pneumonia, minimal effusion. Status post thoracentesis at [facility] hospital. No antibiotics were initiated by admitting physician, I agree that patient does not appear to be having pneumonia at this time. he has been afebrile, normal vital signs. WBC 11.1  4. Chronic systolic CHF- stable, euvolemic. Continue furosemide 40 mg by mouth twice a day. 5. History of NSVT- amiodarone was  stopped due to intolerance during last hospital stay at [facility] hospital. 6. Diabetes mellitus- continue sliding scale insulin. 7. Hyperlipidemia- continue Crestor. 8. Hypertension-blood pressure is stable, continue metoprolol 9. CK D stage III- creatinine at  Baseline 1.56.    DVT prophylaxis: Heparin  Code Status: Full code  Family Communication: no family present at bedside  Disposition Plan: Likely home in 1-2 days   Consultants:  None  Procedures:  None  Continuous infusions . lactated ringers 50 mL/hr at 11/23/16 1126      Antibiotics:   Anti-infectives    Start     Dose/Rate Route Frequency Ordered Stop   11/21/16 1630  cefTRIAXone (ROCEPHIN) 2 g in dextrose 5 % 50 mL IVPB  Status:  Discontinued     2 g 100 mL/hr over 30 Minutes Intravenous Every 24 hours 11/21/16 1557 11/22/16 1532   11/19/16 1515  ceFEPIme (MAXIPIME) 1 g in dextrose 5 % 50 mL IVPB     1 g 100 mL/hr over 30 Minutes Intravenous  Once 11/19/16 1503 11/19/16 1609   11/19/16 1515  vancomycin (  VANCOCIN) IVPB 1000 mg/200 mL premix     1,000 mg 200 mL/hr over 60 Minutes Intravenous  Once 11/19/16 1503 11/19/16 1752   11/19/16 1511  ceFEPIme (MAXIPIME) 1 g injection    Comments:  [name]   : cabinet override       11/19/16 1511 11/20/16 0314       Objective   Vitals:   11/22/16 1946 11/23/16 0026 11/23/16 0515 11/23/16 1102  BP: 140/75 114/69 112/70 (!) 97/51  Pulse: 65 70 77 75  Resp: 18 18 18    Temp: 98.5 F (36.9 C) 97.9 F (36.6 C) 98 F (36.7 C)   TempSrc: Oral Oral Oral   SpO2: 94% 94% 95% 93%  Weight:   86.7 kg (191 lb 3.2 oz)   Height:        Intake/Output Summary (Last 24 hours) at 11/23/16 1206 Last data filed at 11/23/16 1109  Gross per 24 hour  Intake             2665 ml  Output              835 ml  Net             1830 ml   Filed Weights   11/21/16 0559 11/22/16 0603 11/23/16 0515  Weight: 89.1 kg (196 lb 6.4 oz) 88.3 kg (194 lb 11.2 oz) 86.7 kg (191 lb 3.2 oz)     Physical Examination:  Physical Exam: Eyes: No icterus, extraocular muscles intact  Mouth: Oral mucosa is moist, no lesions on palate,  Neck: Supple, no deformities, masses, or tenderness Lungs: Normal respiratory effort, bilateral clear to auscultation, no crackles or wheezes.  Heart: Regular rate and rhythm, S1 and S2 normal, no murmurs, rubs auscultated Abdomen: BS normoactive,soft,nondistended,mild tenderness to palpation in right upper quadrant Extremities: No pretibial edema, no erythema, no cyanosis, no clubbing Neuro : Alert and oriented to time, place and person, No focal deficits Skin: No rashes seen on exam    Data Reviewed: I have personally reviewed following labs and imaging studies  CBG:  Recent Labs Lab 11/22/16 1145 11/22/16 1759 11/22/16 2051 11/23/16 0805 11/23/16 1137  GLUCAP 124* 144* 149* 190* 176*    CBC:  Recent Labs Lab 11/19/16 1326 11/20/16 0114 11/21/16 0249 11/22/16 2220 11/23/16 0556 11/23/16 0913  WBC 10.0 7.9 11.1* 14.8* 15.3* 16.1*  NEUTROABS 7.9*  --   --   --   --   --   HGB 14.3 13.4 14.0 12.6* 11.7* 10.8*  HCT 43.0 41.5 42.6 38.6* 34.5* 32.5*  MCV 97.5 96.7 97.3 96.0 95.8 95.6  PLT 180 166 175 257 237 161    Basic Metabolic  Panel:  Recent Labs Lab 11/19/16 1326 11/20/16 0114 11/21/16 0249 11/23/16 0556  NA 141 143 142 138  K 3.8 3.9 3.7 4.3  CL 100* 104 100* 98*  CO2 30 29 29 29   GLUCOSE 101* 121* 133* 163*  BUN 26* 21* 21* 29*  CREATININE 1.69* 1.58* 1.56* 1.82*  CALCIUM 8.6* 8.4* 8.7* 8.5*    Recent Results (from the past 240 hour(s))  Surgical pcr screen     Status: None   Collection Time: 11/21/16  4:16 PM  Result Value Ref Range Status   MRSA, PCR NEGATIVE NEGATIVE Final   Staphylococcus aureus NEGATIVE NEGATIVE Final    Comment:        The Xpert SA Assay (FDA approved for NASAL specimens in patients over 74 years of age), is one component  of a comprehensive surveillance program.  Test performance has been validated by [facility] for patients greater than or equal to 48 year old. It is not intended to diagnose infection nor to guide or monitor treatment.      Liver Function Tests:  Recent Labs Lab 11/19/16 1326 11/20/16 0114 11/21/16 0249 11/23/16 0556  AST 64* 52* 57* 63*  ALT 61 50 56 47  ALKPHOS 208* 194* 217* 157*  BILITOT 1.2 1.1 1.4* 1.4*  PROT 6.4* 5.4* 6.1* 5.5*  ALBUMIN 3.2* 2.7* 3.1* 2.5*    Recent Labs Lab 11/19/16 1326  LIPASE 35   No results for input(s): AMMONIA in the last 168 hours.  Cardiac Enzymes:  Recent Labs Lab 11/19/16 1326 11/19/16 2057 11/20/16 0114 11/20/16 0738  TROPONINI 0.23* 0.29* 0.31* 0.31*   BNP (last 3 results)  Recent Labs  11/19/16 1326  BNP 439.5*    ProBNP (last 3 results) No results for input(s): PROBNP in the last 8760 hours.    Studies: No results found.  Scheduled Meds: . aspirin  81 mg Oral Daily  . furosemide  40 mg Oral BID  . gabapentin  100 mg Oral QHS  . insulin aspart  0-9 Units Subcutaneous TID WC  . metoprolol succinate  25 mg Oral Daily  . pantoprazole  80 mg Oral Daily  . potassium chloride  20 mEq Oral Daily  . rosuvastatin  10 mg Oral Daily     Time spent: 25 min  [facility] Pager [phone]. If 7PM-7AM, please contact night-coverage at [email or web address], Office  [phone]  password [email or web address]  11/23/2016, 12:06 PM  LOS: 3 days

## 2016-11-23 NOTE — Progress Notes (Signed)
Patient's JP drain was draining large amount of Serosanquinous drainage  @ beginning of shift. Drained 405 cc's between 1916-2100. Asymptomatic and V/SS. Last Hgb was 14 prior to sx. Dr Grandville Silos notified and he responded. CBC and  PT/INR were ordered (results are in EPIC). Dr Grandville Silos was in to see patient again. JP draining less @ this time and no critical labs. Patient being closely monitored for any further deviations. No acute distress noted currently.

## 2016-11-23 NOTE — Plan of Care (Signed)
Problem: Tissue Perfusion: Goal: Risk factors for ineffective tissue perfusion will decrease Outcome: Progressing Maintains oxygen saturation on room air   Problem: Activity: Goal: Risk for activity intolerance will decrease Outcome: Progressing Up in room with standby assist   Problem: Nutrition: Goal: Adequate nutrition will be maintained Outcome: Progressing Tolerating full liquid diet

## 2016-11-23 NOTE — Progress Notes (Signed)
VSS 7 a to 7 p shift,  Patient up in chair and walking in room with standby assist.  Medicated for pain 2 times this shift, did use oxycodone with relief per patient.  JP drain continues to put out large amounts of serosanguinous fluid especially if patient moves around.

## 2016-11-24 LAB — COMPREHENSIVE METABOLIC PANEL
ALK PHOS: 149 U/L — AB (ref 38–126)
ALT: 44 U/L (ref 17–63)
AST: 47 U/L — AB (ref 15–41)
Albumin: 2.3 g/dL — ABNORMAL LOW (ref 3.5–5.0)
Anion gap: 6 (ref 5–15)
BUN: 31 mg/dL — ABNORMAL HIGH (ref 6–20)
CALCIUM: 8.1 mg/dL — AB (ref 8.9–10.3)
CO2: 35 mmol/L — AB (ref 22–32)
CREATININE: 1.79 mg/dL — AB (ref 0.61–1.24)
Chloride: 98 mmol/L — ABNORMAL LOW (ref 101–111)
GFR calc non Af Amer: 35 mL/min — ABNORMAL LOW (ref >60)
GFR, EST AFRICAN AMERICAN: 40 mL/min — AB (ref >60)
GLUCOSE: 135 mg/dL — AB (ref 65–99)
Potassium: 3.8 mmol/L (ref 3.5–5.1)
SODIUM: 139 mmol/L (ref 135–145)
Total Bilirubin: 1.1 mg/dL (ref 0.3–1.2)
Total Protein: 5 g/dL — ABNORMAL LOW (ref 6.5–8.1)

## 2016-11-24 LAB — CBC
HCT: 29.5 % — ABNORMAL LOW (ref 39.0–52.0)
HEMOGLOBIN: 9.6 g/dL — AB (ref 13.0–17.0)
MCH: 31.1 pg (ref 26.0–34.0)
MCHC: 32.5 g/dL (ref 30.0–36.0)
MCV: 95.5 fL (ref 78.0–100.0)
Platelets: 241 10*3/uL (ref 150–400)
RBC: 3.09 MIL/uL — AB (ref 4.22–5.81)
RDW: 15.3 % (ref 11.5–15.5)
WBC: 14.7 10*3/uL — ABNORMAL HIGH (ref 4.0–10.5)

## 2016-11-24 LAB — GLUCOSE, CAPILLARY
GLUCOSE-CAPILLARY: 159 mg/dL — AB (ref 65–99)
Glucose-Capillary: 161 mg/dL — ABNORMAL HIGH (ref 65–99)
Glucose-Capillary: 164 mg/dL — ABNORMAL HIGH (ref 65–99)

## 2016-11-24 MED ORDER — IPRATROPIUM-ALBUTEROL 0.5-2.5 (3) MG/3ML IN SOLN
3.0000 mL | RESPIRATORY_TRACT | Status: DC | PRN
  Administered 2016-11-24 – 2016-11-25 (×2): 3 mL via RESPIRATORY_TRACT
  Filled 2016-11-24 (×2): qty 3

## 2016-11-24 MED ORDER — ALPRAZOLAM 0.5 MG PO TABS
0.5000 mg | ORAL_TABLET | Freq: Every evening | ORAL | Status: AC | PRN
  Administered 2016-11-24 – 2016-11-25 (×2): 0.5 mg via ORAL
  Filled 2016-11-24 (×2): qty 1

## 2016-11-24 NOTE — Progress Notes (Signed)
Patient has continued to make great progress this shift, VSS, only required pain medication one time and nausea medication after eating supper that did not agree with him.  Patient moving around room and sitting up in chair frequently.  JP output has slowed tremendously.  Family members in to visit throughout shift.

## 2016-11-24 NOTE — Progress Notes (Addendum)
RN in room to empty JP drain, patient stated he noted he was slightly short of breath with exertion such as bending over.  Oxygen saturation 88-89% on room air, patient has been off oxygen since 11/23/16 a.m.  Oxygen reapplied at 2 liters, oxygen saturation up to 97%.  Patient stated he felt better.    Discussed above with night RN who will continue to monitor.

## 2016-11-24 NOTE — Progress Notes (Signed)
[facility]  PROGRESS NOTE  Dillon Andrews:[id number] DOB: 1940-12-07 DOA: 11/19/2016 PCP: No primary care provider on file.   Brief HPI:   76 y.o. male with medical history significant of Systolic CHF with EF 95-62% appears to be NICM with only minimal non-occlusive CAD on cath in June of this year; Esophageal varices due to presumed non alcoholic cirrhosis of liver followed by GI Dr. Marin Comment; DM2, HTN, HLD, CKD stage 3 with baseline creat of 1.5; NSVT started on Amiodarone in June after heart cath, Amiodarone stopped during hospital stay this past week due to epigastric discomfort and nausea.  Patient was just hospitalized at [facility] this past week with acute on chronic systolic CHF exacerbation.  His breathing improved significantly with diuresis.  During that hospital stay he was noted to have a RML consolidation and R pleural effusion.  No fevers, no SIRS, completed course of ABx, pro calcitonin was negative.  Ultimately this was felt to be due to asymmetric pulmonary edema.  Patient was discharged from hospital yesterday, family didn't want him to go back to [facility].  Last evening he developed epigastric and RUQ abdominal pain and tenderness.  This has been intermittent and ongoing for past several months.  Associated nausea, no vomiting.  Subjective   Patient seen and examined, complains of 4/10 abdominal pain. No nausea vomiting. Tolerating full liquid diet.   Assessment/Plan:     1. Status post laparoscopic cholecystectomy- patient presented with nausea/epigastric pain- he  had negative GI emptying study during previous hospital stay, HIDA scan also was negative. Abdominal ultrasound showed cholelithiasis,But no cholecystitis patient does have history of elevated liver enzymes .GI was consulted and did recommend general surgery consultation for possible symptomatic cholelithiasis, General surgery saw the patient and patient underwent laparoscopic cholecystectomy.At this time he is  tolerating full liquid diet.diet change and low-fat as per general surgery. 2. Elevated troponin- patient had mild elevation of troponin, cardiac cath  done in [facility] in June showed minimal nonocclusive CAD. He denies chest pain. Cardiology was consulted, no plan for any aggressive intervention at this time. 3. ? Pneumonia- CT chest showed atelectasis versus pneumonia, minimal effusion. Status post thoracentesis at [facility] hospital. No antibiotics were initiated by admitting physician, I agree that patient does not appear to be having pneumonia at this time. he has been afebrile, normal vital signs. WBC  14.7 today. 4. Chronic systolic CHF- stable, euvolemic. Will change furosemide to 40 mg once a day. 5. History of NSVT- amiodarone was  stopped due to intolerance during last hospital stay at [facility] hospital. 6. Diabetes mellitus- continue sliding scale insulin.Blood glucose is 161, 129. 7. Hyperlipidemia- continue Crestor. 8. Hypertension-blood pressure is stable, continue metoprolol 9. CK D stage III- creatinine is 1.79.will change Lasix to 40 mg once a day while in the hospital and then discharged on 40 mg by mouth twice a day    DVT prophylaxis: Heparin  Code Status: Full code  Family Communication: no family present at bedside  Disposition Plan: Likely home in 1-2 days   Consultants:  None  Procedures:  None  Continuous infusions     Antibiotics:   Anti-infectives    Start     Dose/Rate Route Frequency Ordered Stop   11/21/16 1630  cefTRIAXone (ROCEPHIN) 2 g in dextrose 5 % 50 mL IVPB  Status:  Discontinued     2 g 100 mL/hr over 30 Minutes Intravenous Every 24 hours 11/21/16 1557 11/22/16 1532  11/19/16 1515  ceFEPIme (MAXIPIME) 1 g in dextrose 5 % 50 mL IVPB     1 g 100 mL/hr over 30 Minutes Intravenous  Once 11/19/16 1503 11/19/16 1609   11/19/16 1515  vancomycin (VANCOCIN) IVPB 1000 mg/200 mL premix     1,000  mg 200 mL/hr over 60 Minutes Intravenous  Once 11/19/16 1503 11/19/16 1752   11/19/16 1511  ceFEPIme (MAXIPIME) 1 g injection    Comments:  [name]   : cabinet override      11/19/16 1511 11/20/16 0314       Objective   Vitals:   11/23/16 1924 11/24/16 0016 11/24/16 0433 11/24/16 1016  BP: 113/62 (!) 100/51 (!) 104/55 (!) 102/55  Pulse: 73 71 70 78  Resp: 18 18 18    Temp: 98.4 F (36.9 C) 97.9 F (36.6 C) 98 F (36.7 C)   TempSrc: Oral Oral Oral   SpO2: 94% 94% 96% 90%  Weight:   84.9 kg (187 lb 3.2 oz)   Height:        Intake/Output Summary (Last 24 hours) at 11/24/16 1204 Last data filed at 11/24/16 1017  Gross per 24 hour  Intake             1620 ml  Output             2420 ml  Net             -800 ml   Filed Weights   11/22/16 0603 11/23/16 0515 11/24/16 0433  Weight: 88.3 kg (194 lb 11.2 oz) 86.7 kg (191 lb 3.2 oz) 84.9 kg (187 lb 3.2 oz)     Physical Examination:  Physical Exam: Eyes: No icterus, extraocular muscles intact  Mouth: Oral mucosa is moist, no lesions on palate,  Neck: Supple, no deformities, masses, or tenderness Lungs: Normal respiratory effort, bilateral clear to auscultation, no crackles or wheezes.  Heart: Regular rate and rhythm, S1 and S2 normal, no murmurs, rubs auscultated Abdomen: BS normoactive,soft,nondistended,positive right upper quadrant tenderness to palpation. Drain in place with serosanguineous fluid draining. Extremities: No pretibial edema, no erythema, no cyanosis, no clubbing Neuro : Alert and oriented to time, place and person, No focal deficits Skin: No rashes seen on exam    Data Reviewed: I have personally reviewed following labs and imaging studies  CBG:  Recent Labs Lab 11/23/16 0805 11/23/16 1137 11/23/16 1650 11/23/16 2104 11/24/16 1121  GLUCAP 190* 176* 183* 129* 161*    CBC:  Recent Labs Lab 11/19/16 1326  11/21/16 0249 11/22/16 2220 11/23/16 0556 11/23/16 0913 11/24/16 0318  WBC  10.0  < > 11.1* 14.8* 15.3* 16.1* 14.7*  NEUTROABS 7.9*  --   --   --   --   --   --   HGB 14.3  < > 14.0 12.6* 11.7* 10.8* 9.6*  HCT 43.0  < > 42.6 38.6* 34.5* 32.5* 29.5*  MCV 97.5  < > 97.3 96.0 95.8 95.6 95.5  PLT 180  < > 175 257 237 261 241  < > = values in this interval not displayed.  Basic Metabolic Panel:  Recent Labs Lab 11/19/16 1326 11/20/16 0114 11/21/16 0249 11/23/16 0556 11/24/16 0318  NA 141 143 142 138 139  K 3.8 3.9 3.7 4.3 3.8  CL 100* 104 100* 98* 98*  CO2 30 29 29 29  35*  GLUCOSE 101* 121* 133* 163* 135*  BUN 26* 21* 21* 29* 31*  CREATININE 1.69* 1.58* 1.56* 1.82* 1.79*  CALCIUM 8.6*  8.4* 8.7* 8.5* 8.1*    Recent Results (from the past 240 hour(s))  Surgical pcr screen     Status: None   Collection Time: 11/21/16  4:16 PM  Result Value Ref Range Status   MRSA, PCR NEGATIVE NEGATIVE Final   Staphylococcus aureus NEGATIVE NEGATIVE Final    Comment:        The Xpert SA Assay (FDA approved for NASAL specimens in patients over 75 years of age), is one component of a comprehensive surveillance program.  Test performance has been validated by [facility] for patients greater than or equal to 48 year old. It is not intended to diagnose infection nor to guide or monitor treatment.      Liver Function Tests:  Recent Labs Lab 11/19/16 1326 11/20/16 0114 11/21/16 0249 11/23/16 0556 11/24/16 0318  AST 64* 52* 57* 63* 47*  ALT 61 50 56 47 44  ALKPHOS 208* 194* 217* 157* 149*  BILITOT 1.2 1.1 1.4* 1.4* 1.1  PROT 6.4* 5.4* 6.1* 5.5* 5.0*  ALBUMIN 3.2* 2.7* 3.1* 2.5* 2.3*    Recent Labs Lab 11/19/16 1326  LIPASE 35   No results for input(s): AMMONIA in the last 168 hours.  Cardiac Enzymes:  Recent Labs Lab 11/19/16 1326 11/19/16 2057 11/20/16 0114 11/20/16 0738  TROPONINI 0.23* 0.29* 0.31* 0.31*   BNP (last 3 results)  Recent Labs  11/19/16 1326  BNP 439.5*    ProBNP (last 3 results) No results for input(s): PROBNP in the  last 8760 hours.    Studies: No results found.  Scheduled Meds: . aspirin  81 mg Oral Daily  . furosemide  40 mg Oral BID  . gabapentin  100 mg Oral QHS  . insulin aspart  0-9 Units Subcutaneous TID WC  . metoprolol succinate  25 mg Oral Daily  . pantoprazole  80 mg Oral Daily  . potassium chloride  20 mEq Oral Daily  . rosuvastatin  10 mg Oral Daily     Time spent: 25 min  [facility] Pager [phone]. If 7PM-7AM, please contact night-coverage at [email or web address], Office  [phone]  password [email or web address]  11/24/2016, 12:04 PM  LOS: 4 days

## 2016-11-24 NOTE — Progress Notes (Signed)
2 Days Post-Op lap chole  Subjective/Chief Complaint: Pt feeling better.  Tolerating FLD   Objective: Vital signs in last 24 hours: Temp:  [97.9 F (36.6 C)-98.9 F (37.2 C)] 98 F (36.7 C) (08/12 0433) Pulse Rate:  [70-76] 70 (08/12 0433) Resp:  [18-20] 18 (08/12 0433) BP: (97-118)/(51-62) 104/55 (08/12 0433) SpO2:  [92 %-96 %] 96 % (08/12 0433) Weight:  [84.9 kg (187 lb 3.2 oz)] 84.9 kg (187 lb 3.2 oz) (08/12 0433) Last BM Date: 11/21/16  Intake/Output from previous day: 08/11 0701 - 08/12 0700 In: 2170 [P.O.:1110; I.V.:600] Out: 2210 [Urine:1450; Drains:760] Intake/Output this shift: Total I/O In: 240 [P.O.:240] Out: 130 [Urine:130]  Exam: Looks comfortable Abdomen soft, drain serosanguinous  without bile  Lab Results:   Recent Labs  11/23/16 0913 11/24/16 0318  WBC 16.1* 14.7*  HGB 10.8* 9.6*  HCT 32.5* 29.5*  PLT 261 241   BMET  Recent Labs  11/23/16 0556 11/24/16 0318  NA 138 139  K 4.3 3.8  CL 98* 98*  CO2 29 35*  GLUCOSE 163* 135*  BUN 29* 31*  CREATININE 1.82* 1.79*  CALCIUM 8.5* 8.1*   PT/INR  Recent Labs  11/22/16 2220  LABPROT 16.1*  INR 1.28   ABG No results for input(s): PHART, HCO3 in the last 72 hours.  Invalid input(s): PCO2, PO2  Studies/Results: No results found.  Anti-infectives: Anti-infectives    Start     Dose/Rate Route Frequency Ordered Stop   11/21/16 1630  cefTRIAXone (ROCEPHIN) 2 g in dextrose 5 % 50 mL IVPB  Status:  Discontinued     2 g 100 mL/hr over 30 Minutes Intravenous Every 24 hours 11/21/16 1557 11/22/16 1532   11/19/16 1515  ceFEPIme (MAXIPIME) 1 g in dextrose 5 % 50 mL IVPB     1 g 100 mL/hr over 30 Minutes Intravenous  Once 11/19/16 1503 11/19/16 1609   11/19/16 1515  vancomycin (VANCOCIN) IVPB 1000 mg/200 mL premix     1,000 mg 200 mL/hr over 60 Minutes Intravenous  Once 11/19/16 1503 11/19/16 1752   11/19/16 1511  ceFEPIme (MAXIPIME) 1 g injection    Comments:  [name]   : cabinet  override      11/19/16 1511 11/20/16 0314      Assessment/Plan: s/p Procedure(s): LAPAROSCOPIC CHOLECYSTECTOMY (N/A)  Suspect most of the fluid is from irrigation but his hemoglobin has dropped some.  Per operating surgeon, it was a necrotic liver with a lot of intra-op bleeding.  He also had a necrotic cystic stump so is at risk for bile leak Would hold lovenox/heparin again today but will restart in AM.  Repeat labs in AM Low fat diet.  SL IVF's PO narcotics   LOS: 4 days    Luis Sami C. [id number]

## 2016-11-25 ENCOUNTER — Inpatient Hospital Stay (HOSPITAL_COMMUNITY): Payer: Medicare HMO

## 2016-11-25 ENCOUNTER — Encounter (HOSPITAL_COMMUNITY): Payer: Self-pay | Admitting: Surgery

## 2016-11-25 DIAGNOSIS — J181 Lobar pneumonia, unspecified organism: Secondary | ICD-10-CM

## 2016-11-25 DIAGNOSIS — R0603 Acute respiratory distress: Secondary | ICD-10-CM

## 2016-11-25 LAB — BASIC METABOLIC PANEL
Anion gap: 9 (ref 5–15)
BUN: 27 mg/dL — ABNORMAL HIGH (ref 6–20)
CHLORIDE: 99 mmol/L — AB (ref 101–111)
CO2: 33 mmol/L — AB (ref 22–32)
Calcium: 8.2 mg/dL — ABNORMAL LOW (ref 8.9–10.3)
Creatinine, Ser: 1.66 mg/dL — ABNORMAL HIGH (ref 0.61–1.24)
GFR calc non Af Amer: 38 mL/min — ABNORMAL LOW (ref >60)
GFR, EST AFRICAN AMERICAN: 44 mL/min — AB (ref >60)
Glucose, Bld: 129 mg/dL — ABNORMAL HIGH (ref 65–99)
POTASSIUM: 3.5 mmol/L (ref 3.5–5.1)
Sodium: 141 mmol/L (ref 135–145)

## 2016-11-25 LAB — CBC
HCT: 27.4 % — ABNORMAL LOW (ref 39.0–52.0)
HEMATOCRIT: 27.3 % — AB (ref 39.0–52.0)
HEMOGLOBIN: 8.9 g/dL — AB (ref 13.0–17.0)
HEMOGLOBIN: 8.7 g/dL — AB (ref 13.0–17.0)
MCH: 31.1 pg (ref 26.0–34.0)
MCH: 30.7 pg (ref 26.0–34.0)
MCHC: 32.6 g/dL (ref 30.0–36.0)
MCHC: 31.8 g/dL (ref 30.0–36.0)
MCV: 95.5 fL (ref 78.0–100.0)
MCV: 96.8 fL (ref 78.0–100.0)
PLATELETS: 254 10*3/uL (ref 150–400)
Platelets: 243 10*3/uL (ref 150–400)
RBC: 2.86 MIL/uL — AB (ref 4.22–5.81)
RBC: 2.83 MIL/uL — ABNORMAL LOW (ref 4.22–5.81)
RDW: 15.4 % (ref 11.5–15.5)
RDW: 15.3 % (ref 11.5–15.5)
WBC: 9.8 10*3/uL (ref 4.0–10.5)
WBC: 9.1 10*3/uL (ref 4.0–10.5)

## 2016-11-25 LAB — GLUCOSE, CAPILLARY
GLUCOSE-CAPILLARY: 121 mg/dL — AB (ref 65–99)
GLUCOSE-CAPILLARY: 182 mg/dL — AB (ref 65–99)
GLUCOSE-CAPILLARY: 141 mg/dL — AB (ref 65–99)

## 2016-11-25 LAB — LACTIC ACID, PLASMA
LACTIC ACID, VENOUS: 2.1 mmol/L — AB (ref 0.5–1.9)
Lactic Acid, Venous: 1.6 mmol/L (ref 0.5–1.9)

## 2016-11-25 LAB — BLOOD GAS, ARTERIAL
ACID-BASE EXCESS: 8.4 mmol/L — AB (ref 0.0–2.0)
BICARBONATE: 32.5 mmol/L — AB (ref 20.0–28.0)
DRAWN BY: 24513
O2 CONTENT: 3 L/min
O2 Saturation: 95.6 %
PCO2 ART: 45.7 mmHg (ref 32.0–48.0)
PH ART: 7.465 — AB (ref 7.350–7.450)
PO2 ART: 80.7 mmHg — AB (ref 83.0–108.0)
Patient temperature: 98.6

## 2016-11-25 LAB — PROCALCITONIN: PROCALCITONIN: 0.1 ng/mL

## 2016-11-25 MED ORDER — SODIUM CHLORIDE 0.9 % IV SOLN
INTRAVENOUS | Status: DC
  Administered 2016-11-25: 20:00:00 via INTRAVENOUS

## 2016-11-25 MED ORDER — LEVOFLOXACIN IN D5W 750 MG/150ML IV SOLN
750.0000 mg | INTRAVENOUS | Status: DC
  Administered 2016-11-25: 750 mg via INTRAVENOUS
  Filled 2016-11-25 (×2): qty 150

## 2016-11-25 MED ORDER — VANCOMYCIN HCL 10 G IV SOLR
1250.0000 mg | INTRAVENOUS | Status: DC
  Administered 2016-11-25 – 2016-11-26 (×2): 1250 mg via INTRAVENOUS
  Filled 2016-11-25 (×2): qty 1250

## 2016-11-25 MED ORDER — SODIUM CHLORIDE 0.9 % IV SOLN
INTRAVENOUS | Status: DC
  Administered 2016-11-25 – 2016-11-27 (×2): via INTRAVENOUS

## 2016-11-25 MED ORDER — PIPERACILLIN-TAZOBACTAM 3.375 G IVPB
3.3750 g | Freq: Three times a day (TID) | INTRAVENOUS | Status: DC
  Administered 2016-11-25 – 2016-11-28 (×9): 3.375 g via INTRAVENOUS
  Filled 2016-11-25 (×10): qty 50

## 2016-11-25 MED ORDER — LEVOFLOXACIN IN D5W 500 MG/100ML IV SOLN: 500.0000 mg | INTRAVENOUS | Status: DC

## 2016-11-25 NOTE — Progress Notes (Addendum)
CXR obtained today shows persistent infiltrate, and patient continues to be short of breath after surgery. Will start Levaquin per pharmacy for ? Pneumonia. Will also obtain VQ scan to rule out PE. Cannot obtain CTA due to CKD stage 3. Creatinine is 1.66. He was not on DVT prophylaxis with heparin   post surgery due to bleeding in the drain per surgery. He was ordered SCD's for DVT prophylaxis

## 2016-11-25 NOTE — Progress Notes (Signed)
Pharmacy Antibiotic Note  Dillon Andrews is a 76 y.o. male admitted on 11/19/2016.  Pharmacy has been consulted for Levofloxacin dosing for HCAP.  Scr 1.66, estimated CrCl is ~ 43m/min.   Plan: Levofloxacin 500 mg IV q24 hours.  Height: 5' 11"  (180.3 cm) Weight: 186 lb 1.6 oz (84.4 kg) IBW/kg (Calculated) : 75.3  Temp (24hrs), Avg:98.1 F (36.7 C), Min:97.5 F (36.4 C), Max:98.4 F (36.9 C)   Recent Labs Lab 11/20/16 0114 11/21/16 0249 11/22/16 2220 11/23/16 0556 11/23/16 0913 11/24/16 0318 11/25/16 0332  WBC 7.9 11.1* 14.8* 15.3* 16.1* 14.7* 9.8  CREATININE 1.58* 1.56*  --  1.82*  --  1.79* 1.66*    Estimated Creatinine Clearance: 39.7 mL/min (A) (by C-G formula based on SCr of 1.66 mg/dL (H)).    No Known Allergies  Antimicrobials this admission: Levaquin 8/13>>  Microbiology results: MRSA PCR: negative  Thank you for allowing pharmacy to be a part of this patient's care. RNicole Cella RPh Clinical Pharmacist Pager: [phone]-[phone] #[phone] #[phone]#[phone]/13/2018 2:49 PM

## 2016-11-25 NOTE — Anesthesia Postprocedure Evaluation (Signed)
Anesthesia Post Note  Patient: Dillon Andrews  Procedure(s) Performed: Procedure(s) (LRB): LAPAROSCOPIC CHOLECYSTECTOMY (N/A)     Patient location during evaluation: PACU Anesthesia Type: General Level of consciousness: sedated and patient cooperative Pain management: pain level controlled Vital Signs Assessment: post-procedure vital signs reviewed and stable Respiratory status: spontaneous breathing Cardiovascular status: stable Anesthetic complications: no    Last Vitals:  Vitals:   11/25/16 1943 11/25/16 1954  BP:  (!) 144/52  Pulse:  70  Resp:  (!) 24  Temp:  36.8 C  SpO2: 94% 96%    Last Pain:  Vitals:   11/25/16 1954  TempSrc: Oral  PainSc:                  Nolon Nations

## 2016-11-25 NOTE — Progress Notes (Addendum)
Pt is transferred to stepdown for higher level of care 3w-24, vitals stable, pt is not having CP and SOB this point, report provided to the receiving nurse,  Pt's is getting IVABX and it is running right now, rest of the IV ABX is carried to the 3west, pt left the floor with all of his belonigngs accompanied with RN and family members.  Pt's recent Lactid acid is 2.1, MD aware, getting IV fluid at 125cc/hr, pt's have only one IV this time so IV team consultation done for second IV to continue IV fluid to the patient, recent JP drain empty and recorded 50cc.  Palma Holter, RN

## 2016-11-25 NOTE — Progress Notes (Addendum)
[facility]  PROGRESS NOTE  Dillon Andrews WHQ:[id number] DOB: [birth year] DOA: 11/19/2016 PCP: No primary care provider on file.   Brief HPI:   76 y.o. male with medical history significant of Systolic CHF with EF 65-99% appears to be NICM with only minimal non-occlusive CAD on cath in June of this year; Esophageal varices due to presumed non alcoholic cirrhosis of liver followed by GI Dr. Marin Comment; DM2, HTN, HLD, CKD stage 3 with baseline creat of 1.5; NSVT started on Amiodarone in June after heart cath, Amiodarone stopped during hospital stay this past week due to epigastric discomfort and nausea.  Patient was just hospitalized at [facility] this past week with acute on chronic systolic CHF exacerbation.  His breathing improved significantly with diuresis.  During that hospital stay he was noted to have a RML consolidation and R pleural effusion.  No fevers, no SIRS, completed course of ABx, pro calcitonin was negative.  Ultimately this was felt to be due to asymmetric pulmonary edema.  Patient was discharged from hospital yesterday, family didn't want him to go back to [facility].  Last evening he developed epigastric and RUQ abdominal pain and tenderness.  This has been intermittent and ongoing for past several months.  Associated nausea, no vomiting.  Subjective   Patient seen and examined, became short of breath last night, requiring oxygen via nasal cannula. This morning he denies shortness of breath. Not on oxygen.   Assessment/Plan:     1. Status post laparoscopic cholecystectomy- patient presented with nausea/epigastric pain- he  had negative GI emptying study during previous hospital stay, HIDA scan also was negative. Abdominal ultrasound showed cholelithiasis,But no cholecystitis patient does have history of elevated liver enzymes .GI was consulted and did recommend general surgery consultation for possible symptomatic cholelithiasis, General surgery saw the patient and patient underwent  laparoscopic cholecystectomy.At this time he is tolerating full liquid diet.diet change and low-fat as per general surgery. 2. Elevated troponin- patient had mild elevation of troponin, cardiac cath  done in [facility] in June showed minimal nonocclusive CAD. He denies chest pain. Cardiology was consulted, no plan for any aggressive intervention at this time. 3. ? Pneumonia- CT chest showed atelectasis versus pneumonia, minimal effusion. Status post thoracentesis at [facility] hospital. No antibiotics were initiated by admitting physician,I also felt that patient did not have pneumonia as he did not have leukocytosis, no shortness of breath. But he developed shortness of breath last night. And chest x-ray shows persistent infiltrate. We'll start Levaquin 750 mg daily for 7 days. 4. ? Pneumoperitoneum- chest x-ray showed focus of air at the right base above the diaphragm, given the possibility of pneumoperitoneum left lateral decubitus abdominal film. Will order left lateral decubitus abdominal film. 5. Chronic systolic CHF- stable, euvolemic. Continue furosemide 40 mg by mouth twice a day 6. History of NSVT- amiodarone was  stopped due to intolerance during last hospital stay at [facility] hospital. 7. Diabetes mellitus- continue sliding scale insulin.Blood glucose is 121,182. 8. Hyperlipidemia- continue Crestor. 9. Hypertension-blood pressure is stable, continue metoprolol 10. CKD stage III- creatinine is 1.66 today. Improved from 1.79 yesterday.    DVT prophylaxis: SCD's  Code Status: Full code  Family Communication: no family present at bedside  Disposition Plan: Likely home in 1-2 days   Consultants:  None  Procedures:  None  Continuous infusions     Antibiotics:   Anti-infectives    Start     Dose/Rate Route Frequency Ordered Stop  11/21/16 1630  cefTRIAXone (ROCEPHIN) 2 g in dextrose 5 % 50 mL IVPB  Status:  Discontinued     2 g 100  mL/hr over 30 Minutes Intravenous Every 24 hours 11/21/16 1557 11/22/16 1532   11/19/16 1515  ceFEPIme (MAXIPIME) 1 g in dextrose 5 % 50 mL IVPB     1 g 100 mL/hr over 30 Minutes Intravenous  Once 11/19/16 1503 11/19/16 1609   11/19/16 1515  vancomycin (VANCOCIN) IVPB 1000 mg/200 mL premix     1,000 mg 200 mL/hr over 60 Minutes Intravenous  Once 11/19/16 1503 11/19/16 1752   11/19/16 1511  ceFEPIme (MAXIPIME) 1 g injection    Comments:  [name]   : cabinet override      11/19/16 1511 11/20/16 0314       Objective   Vitals:   11/24/16 1932 11/24/16 1940 11/24/16 2048 11/25/16 0514  BP:  (!) 110/55 (!) 114/58 (!) 100/59  Pulse:  76 78 67  Resp:  18 (!) 24 20  Temp:  98.4 F (36.9 C) 98.4 F (36.9 C) (!) 97.5 F (36.4 C)  TempSrc:  Oral Oral Oral  SpO2: 97% 96% 94% 93%  Weight:    84.4 kg (186 lb 1.6 oz)  Height:        Intake/Output Summary (Last 24 hours) at 11/25/16 1210 Last data filed at 11/25/16 0851  Gross per 24 hour  Intake              660 ml  Output              995 ml  Net             -335 ml   Filed Weights   11/23/16 0515 11/24/16 0433 11/25/16 0514  Weight: 86.7 kg (191 lb 3.2 oz) 84.9 kg (187 lb 3.2 oz) 84.4 kg (186 lb 1.6 oz)     Physical Examination:  Physical Exam: Eyes: No icterus, extraocular muscles intact  Mouth: Oral mucosa is moist, no lesions on palate,  Neck: Supple, no deformities, masses, or tenderness Lungs: Normal respiratory effort, bilateral clear to auscultation, no crackles or wheezes.  Heart: Regular rate and rhythm, S1 and S2 normal, no murmurs, rubs auscultated Abdomen: BS normoactive,soft,nondistended, mild tenderness to palpation in right upper quadrant Extremities-  No erythema, no cyanosis, no clubbing Neuro : Alert and oriented to time, place and person, No focal deficits Skin: No rashes seen on exam    Data Reviewed: I have personally reviewed following labs and imaging studies  CBG:  Recent Labs Lab  11/24/16 1121 11/24/16 1628 11/24/16 2106 11/25/16 0740 11/25/16 1151  GLUCAP 161* 164* 159* 121* 182*    CBC:  Recent Labs Lab 11/19/16 1326  11/22/16 2220 11/23/16 0556 11/23/16 0913 11/24/16 0318 11/25/16 0332  WBC 10.0  < > 14.8* 15.3* 16.1* 14.7* 9.8  NEUTROABS 7.9*  --   --   --   --   --   --   HGB 14.3  < > 12.6* 11.7* 10.8* 9.6* 8.9*  HCT 43.0  < > 38.6* 34.5* 32.5* 29.5* 27.3*  MCV 97.5  < > 96.0 95.8 95.6 95.5 95.5  PLT 180  < > 257 237 261 241 243  < > = values in this interval not displayed.  Basic Metabolic Panel:  Recent Labs Lab 11/20/16 0114 11/21/16 0249 11/23/16 0556 11/24/16 0318 11/25/16 0332  NA 143 142 138 139 141  K 3.9 3.7 4.3 3.8 3.5  CL  104 100* 98* 98* 99*  CO2 29 29 29  35* 33*  GLUCOSE 121* 133* 163* 135* 129*  BUN 21* 21* 29* 31* 27*  CREATININE 1.58* 1.56* 1.82* 1.79* 1.66*  CALCIUM 8.4* 8.7* 8.5* 8.1* 8.2*    Recent Results (from the past 240 hour(s))  Surgical pcr screen     Status: None   Collection Time: 11/21/16  4:16 PM  Result Value Ref Range Status   MRSA, PCR NEGATIVE NEGATIVE Final   Staphylococcus aureus NEGATIVE NEGATIVE Final    Comment:        The Xpert SA Assay (FDA approved for NASAL specimens in patients over 44 years of age), is one component of a comprehensive surveillance program.  Test performance has been validated by [facility] for patients greater than or equal to 77 year old. It is not intended to diagnose infection nor to guide or monitor treatment.      Liver Function Tests:  Recent Labs Lab 11/19/16 1326 11/20/16 0114 11/21/16 0249 11/23/16 0556 11/24/16 0318  AST 64* 52* 57* 63* 47*  ALT 61 50 56 47 44  ALKPHOS 208* 194* 217* 157* 149*  BILITOT 1.2 1.1 1.4* 1.4* 1.1  PROT 6.4* 5.4* 6.1* 5.5* 5.0*  ALBUMIN 3.2* 2.7* 3.1* 2.5* 2.3*    Recent Labs Lab 11/19/16 1326  LIPASE 35   No results for input(s): AMMONIA in the last 168 hours.  Cardiac Enzymes:  Recent Labs Lab  11/19/16 1326 11/19/16 2057 11/20/16 0114 11/20/16 0738  TROPONINI 0.23* 0.29* 0.31* 0.31*   BNP (last 3 results)  Recent Labs  11/19/16 1326  BNP 439.5*    ProBNP (last 3 results) No results for input(s): PROBNP in the last 8760 hours.    Studies: Dg Chest 2 View  Result Date: 11/25/2016 CLINICAL DATA:  Shortness of Breath EXAM: CHEST  2 VIEW COMPARISON:  November 19, 2016 chest radiograph and chest CT November 19, 2016 FINDINGS: There are small pleural effusions bilaterally. There is persistent patchy infiltrate in the right base with new patchy consolidation in the medial left base. There is also bibasilar atelectatic change. The upper lung zones are clear. Heart is mildly enlarged with pulmonary vascularity within normal limits. There is aortic atherosclerosis. There is degenerative change in the thoracic spine. There is a focus of air at the right basal frontal lateral views which probably is above the diaphragm. It is difficult to entirely exclude a small focus of air below the diaphragm in this area given the position of air with respect to the diaphragm. IMPRESSION: 1. Increase in patchy infiltrate in the left base with small left pleural effusion. There is a focal area of patchy infiltrate right base which is essentially stable. Small right pleural effusion. Bibasilar atelectasis. Mild cardiomegaly is noted.  There is aortic atherosclerosis. There is a focus of air at the right base, probably above the diaphragm. Given the possibility of pneumoperitoneum in this area, however, a left lateral decubitus abdomen image to assess for the possibility of pneumoperitoneum is felt to be advisable. These results will be called to the ordering clinician or representative by the Radiologist Assistant, and communication documented in the PACS or zVision Dashboard. Aortic Atherosclerosis (ICD10-I70.0). Electronically Signed   By: Lowella Grip III M.D.   On: 11/25/2016 09:52    Scheduled Meds: .  aspirin  81 mg Oral Daily  . furosemide  40 mg Oral BID  . gabapentin  100 mg Oral QHS  . insulin aspart  0-9 Units  Subcutaneous TID WC  . metoprolol succinate  25 mg Oral Daily  . pantoprazole  80 mg Oral Daily  . potassium chloride  20 mEq Oral Daily  . rosuvastatin  10 mg Oral Daily     Time spent: 25 min  [place] [facility] Pager [phone]. If 7PM-7AM, please contact night-coverage at [email or web address], Office  [phone]  password [email or web address]  11/25/2016, 12:10 PM  LOS: 3 days

## 2016-11-25 NOTE — Progress Notes (Addendum)
Called by RN the patient is having worsening shortness of breath, though O2 sats 96% on 2 L of oxygen via nasal cannula.  Came to see patient, who is complaining of shortness of breath. O2 sats 96% on 2 L of oxygen by nasal cannula. Patient's hemoglobin dropped to 8.9 today, still has blood draining in the biliary drain. Denies chest pain or abdominal pain. Complains of discomfort in right upper quadrant. Chest x-ray today showed new infiltrate in left lung field could be pneumonia.  Vitals blood pressure 124/52, respiration 30 per minute, temperature 98.9  On exam Tachypnea, Chest is clear to auscultation bilaterally Heart is S1-S2, regular Abdomen-positive right upper quadrant tenderness to palpation, no rigidity or guarding  Assessment Acute respiratory distress ? Healthcare associated pneumonia Anemia postoperative ? Pulmonary embolism   Plan Stat CBC, lactic acid, Procalcitonin Discussed with Dr. Hulen Skains, he does not think that patient needs CT abdomen at this time. Start vancomycin, Zosyn, Levaquin VQ scan ordered for questionable PE

## 2016-11-25 NOTE — Progress Notes (Signed)
Patient complaining of shortness of breath, and having to use stomach to help him get a full breath.  Oxygen saturation 96% on 2L.  Duoneb given per prn orders with minimal relief. Dr. Darrick Meigs and rapid response called.  Will continue to monitor patient.

## 2016-11-25 NOTE — Progress Notes (Signed)
Patient is subjectively short of breath.  His respiratory rate is about 26-30, oxygen saturations are 96% on 2L by [place].  His color is pink.  He does not appear to be in any distress.  His abdominal examination is benign.  Excellent bowel sounds.  Ate well at lunch, but has no appetite now.  WBC last check was < 10K.  He has no fever.  A repeat CBC is pending.  He has had a total of 160cc of bloody--not bilious--output from his [place] drain.  To be safe I will check an ABG, but I expect a PaO2 in the 70-85 range and a normal PaCO2.  He is not septic.  Also will have the patient work on his IS.  Kathryne Eriksson. Dahlia Bailiff, MD, [place] [phone] [facility]

## 2016-11-25 NOTE — Progress Notes (Signed)
[facility] Progress Note  3 Days Post-Op  Subjective: CC:  Abdominal pain improving - denies pain at rest, mild RUQ pain with coughing. Pulling 1,000 cc on IS. Tolerating low fat diet. +flatus. Denies BM. Denies dizziness/light-headedness at rest or when up walking in his room. Endorses orthopnea last night.   Objective: Vital signs in last 24 hours: Temp:  [97.5 F (36.4 C)-98.4 F (36.9 C)] 97.5 F (36.4 C) (08/13 0514) Pulse Rate:  [67-78] 67 (08/13 0514) Resp:  [18-24] 20 (08/13 0514) BP: (100-114)/(55-59) 100/59 (08/13 0514) SpO2:  [88 %-97 %] 93 % (08/13 0514) Weight:  [84.4 kg (186 lb 1.6 oz)] 84.4 kg (186 lb 1.6 oz) (08/13 0514) Last BM Date: 11/21/16  Intake/Output from previous day: 08/12 0701 - 08/13 0700 In: 660 [P.O.:660] Out: 1355 [Urine:1030; Drains:325] Intake/Output this shift: No intake/output data recorded.  PE: Gen:  Alert, NAD, pleasant Pulm:  Normal effort Abd: Soft, non-tender, mild distention, bowel sounds present, incisions C/D/I, JP drain with full of sanguinous drainage (325 cc/24h) Skin: warm and dry, no rashes  Psych: A&Ox3   Lab Results:   Recent Labs  11/24/16 0318 11/25/16 0332  WBC 14.7* 9.8  HGB 9.6* 8.9*  HCT 29.5* 27.3*  PLT 241 243   BMET  Recent Labs  11/24/16 0318 11/25/16 0332  NA 139 141  K 3.8 3.5  CL 98* 99*  CO2 35* 33*  GLUCOSE 135* 129*  BUN 31* 27*  CREATININE 1.79* 1.66*  CALCIUM 8.1* 8.2*   PT/INR  Recent Labs  11/22/16 2220  LABPROT 16.1*  INR 1.28   CMP     Component Value Date/Time   NA 141 11/25/2016 0332   K 3.5 11/25/2016 0332   CL 99 (L) 11/25/2016 0332   CO2 33 (H) 11/25/2016 0332   GLUCOSE 129 (H) 11/25/2016 0332   BUN 27 (H) 11/25/2016 0332   CREATININE 1.66 (H) 11/25/2016 0332   CALCIUM 8.2 (L) 11/25/2016 0332   PROT 5.0 (L) 11/24/2016 0318   ALBUMIN 2.3 (L) 11/24/2016 0318   AST 47 (H) 11/24/2016 0318   ALT 44 11/24/2016 0318   ALKPHOS 149 (H) 11/24/2016 0318    BILITOT 1.1 11/24/2016 0318   GFRNONAA 38 (L) 11/25/2016 0332   GFRAA 44 (L) 11/25/2016 0332   Lipase     Component Value Date/Time   LIPASE 35 11/19/2016 1326   Studies/Results: No results found.  Anti-infectives: Anti-infectives    Start     Dose/Rate Route Frequency Ordered Stop   11/21/16 1630  cefTRIAXone (ROCEPHIN) 2 g in dextrose 5 % 50 mL IVPB  Status:  Discontinued     2 g 100 mL/hr over 30 Minutes Intravenous Every 24 hours 11/21/16 1557 11/22/16 1532   11/19/16 1515  ceFEPIme (MAXIPIME) 1 g in dextrose 5 % 50 mL IVPB     1 g 100 mL/hr over 30 Minutes Intravenous  Once 11/19/16 1503 11/19/16 1609   11/19/16 1515  vancomycin (VANCOCIN) IVPB 1000 mg/200 mL premix     1,000 mg 200 mL/hr over 60 Minutes Intravenous  Once 11/19/16 1503 11/19/16 1752   11/19/16 1511  ceFEPIme (MAXIPIME) 1 g injection    Comments:  Dillon Andrews   : cabinet override      11/19/16 1511 11/20/16 0314     Assessment/Plan Acute on chronic calculous cholecystitis  s/p Procedure(s): LAPAROSCOPIC CHOLECYSTECTOMY (N/A) - per operating surgeon, necrotic liver/cystic stump w/ high amound intra-operative bleeding; risk for bile leak - tolerating low  fat diet - JP OP 325 cc/24 h sanguinous, no bile appreciated in drain  - mobilize/IS - pain: tylenol PRN, Oxycodone 5 mg PRN,   Normocytic anemia - IVF saline locked, hgb 8.9 continue to follow Elevated troponin Chronic systolic CHF - CXR this AM pending  Hx NSVT DM HLD - crestor 10 mg HTN CKD II  FEN: low fat diet  ID: Cefepime x 1 dose 8/7, Ancef x 1 dose 8/9,  Leukocytosis resolved VTE: SCD's  Dispo: pain controlled, tolerating PO, Drain output sanguinous and non-bilious Recommend continuing to hold chemical VTE and re-checking CBC in AM, if hgb/hct stable patient will be stable for discharge from surgical perspective. Will require HH for drain care and OP F/U in our office in 1-2 weeks for possible drain removal.   LOS: 5 days     Dillon Andrews , [facility] 11/25/2016, 7:25 AM Pager: [phone] Consults: [phone] Mon-Fri 7:00 am-4:30 pm Sat-Sun 7:00 am-11:30 am

## 2016-11-25 NOTE — Progress Notes (Addendum)
Pharmacy Antibiotic Note  Dillon Andrews is a 76 y.o. male admitted on 11/19/2016.  Pharmacy has been consulted for vancomycin and Zosyn dosing for HCAP.    Plan: Zosyn 3.375g IV q8h EI Vancomycin 1237m IV q24h Levofloxacin 7541mIV q48h as per earlier orders Follow c/s, clinical progression, renal function, trough at SS Follow LOT for all abx  Height: 5' 11"  (180.3 cm) Weight: 186 lb 1.6 oz (84.4 kg) IBW/kg (Calculated) : 75.3  Temp (24hrs), Avg:98.1 F (36.7 C), Min:97.5 F (36.4 C), Max:98.4 F (36.9 C)   Recent Labs Lab 11/20/16 0114 11/21/16 0249 11/22/16 2220 11/23/16 0556 11/23/16 0913 11/24/16 0318 11/25/16 0332  WBC 7.9 11.1* 14.8* 15.3* 16.1* 14.7* 9.8  CREATININE 1.58* 1.56*  --  1.82*  --  1.79* 1.66*    Estimated Creatinine Clearance: 39.7 mL/min (A) (by C-G formula based on SCr of 1.66 mg/dL (H)).    No Known Allergies  Antimicrobials this admission: Levaquin 8/13>> Vancomycin 8/13>> Zosyn 8/13>>  Microbiology results: MRSA PCR: negative  Dillon Andrews D. Marianne Golightly, PharmD, BCPavillionlinical Pharmacist Pager: [phone]/13/2018 6:56 PM

## 2016-11-26 ENCOUNTER — Inpatient Hospital Stay (HOSPITAL_COMMUNITY): Payer: Medicare HMO

## 2016-11-26 DIAGNOSIS — J189 Pneumonia, unspecified organism: Secondary | ICD-10-CM

## 2016-11-26 DIAGNOSIS — R0602 Shortness of breath: Secondary | ICD-10-CM

## 2016-11-26 LAB — PROCALCITONIN: Procalcitonin: <0.1 ng/mL

## 2016-11-26 LAB — BASIC METABOLIC PANEL
Anion gap: 6 (ref 5–15)
BUN: 24 mg/dL — ABNORMAL HIGH (ref 6–20)
CALCIUM: 8 mg/dL — AB (ref 8.9–10.3)
CO2: 35 mmol/L — AB (ref 22–32)
CREATININE: 1.53 mg/dL — AB (ref 0.61–1.24)
Chloride: 100 mmol/L — ABNORMAL LOW (ref 101–111)
GFR calc non Af Amer: 42 mL/min — ABNORMAL LOW (ref >60)
GFR, EST AFRICAN AMERICAN: 49 mL/min — AB (ref >60)
GLUCOSE: 132 mg/dL — AB (ref 65–99)
Potassium: 3.6 mmol/L (ref 3.5–5.1)
Sodium: 141 mmol/L (ref 135–145)

## 2016-11-26 LAB — CBC
HEMATOCRIT: 26.6 % — AB (ref 39.0–52.0)
Hemoglobin: 8.5 g/dL — ABNORMAL LOW (ref 13.0–17.0)
MCH: 30.9 pg (ref 26.0–34.0)
MCHC: 32 g/dL (ref 30.0–36.0)
MCV: 96.7 fL (ref 78.0–100.0)
Platelets: 243 10*3/uL (ref 150–400)
RBC: 2.75 MIL/uL — ABNORMAL LOW (ref 4.22–5.81)
RDW: 15.4 % (ref 11.5–15.5)
WBC: 7.8 10*3/uL (ref 4.0–10.5)

## 2016-11-26 LAB — GLUCOSE, CAPILLARY
GLUCOSE-CAPILLARY: 123 mg/dL — AB (ref 65–99)
GLUCOSE-CAPILLARY: 196 mg/dL — AB (ref 65–99)
Glucose-Capillary: 135 mg/dL — ABNORMAL HIGH (ref 65–99)
Glucose-Capillary: 162 mg/dL — ABNORMAL HIGH (ref 65–99)

## 2016-11-26 MED ORDER — ZOLPIDEM TARTRATE 5 MG PO TABS
5.0000 mg | ORAL_TABLET | Freq: Once | ORAL | Status: AC
  Administered 2016-11-26: 5 mg via ORAL
  Filled 2016-11-26: qty 1

## 2016-11-26 MED ORDER — TECHNETIUM TC 99M DIETHYLENETRIAME-PENTAACETIC ACID: 32.0000 | Freq: Once | INTRAVENOUS | Status: DC | PRN

## 2016-11-26 MED ORDER — TECHNETIUM TO 99M ALBUMIN AGGREGATED
4.0000 | Freq: Once | INTRAVENOUS | Status: AC | PRN
  Administered 2016-11-26: 4 via INTRAVENOUS

## 2016-11-26 MED ORDER — GI COCKTAIL ~~LOC~~: 30.0000 mL | Freq: Once | ORAL | Status: DC

## 2016-11-26 MED ORDER — DOCUSATE SODIUM 100 MG PO CAPS
100.0000 mg | ORAL_CAPSULE | Freq: Two times a day (BID) | ORAL | Status: DC
  Administered 2016-11-27: 100 mg via ORAL
  Filled 2016-11-26 (×2): qty 1

## 2016-11-26 MED ORDER — DOCUSATE SODIUM 100 MG PO CAPS
200.0000 mg | ORAL_CAPSULE | Freq: Once | ORAL | Status: AC
  Administered 2016-11-26: 200 mg via ORAL
  Filled 2016-11-26: qty 2

## 2016-11-26 NOTE — Progress Notes (Signed)
[facility] Progress Note  4 Days Post-Op  Subjective: CC: SOB Daughter at bedside. Patient reports that yesterday around 3 PM he started to feel worse, endorsing SOB and decreased appetite. He reports mild RUQ soreness but denies abdominal pain. Daughter expresses concern regarding swelling over right flank. He denies fever, chills, nausea, or vomiting. +Flatus. Denies BM.   Objective: Vital signs in last 24 hours: Temp:  [98 F (36.7 C)-98.9 F (37.2 C)] 98.3 F (36.8 C) (08/14 0443) Pulse Rate:  [67-82] 67 (08/14 0443) Resp:  [15-30] 16 (08/14 0443) BP: (102-144)/(52-69) 117/57 (08/13 2329) SpO2:  [93 %-100 %] 96 % (08/14 0443) Weight:  [84.5 kg (186 lb 4.8 oz)] 84.5 kg (186 lb 4.8 oz) (08/14 0443) Last BM Date: 11/21/16  Intake/Output from previous day: 08/13 0701 - 08/14 0700 In: 870 [P.O.:720; IV Piggyback:150] Out: 1903 [Urine:1625; Drains:278] Intake/Output this shift: No intake/output data recorded.  PE: Gen:  Alert, NAD, pleasant Pulm: mildly labored work of breathing; lungs CTAB with diminished breath sounds in bilateral bases. Abd: Soft, mild TTP RUQ, mild palpable fullness in right flank that is non-tender without overlying cellulitis or induration, bowel sounds present, incisions C/D/I, JP drain with small amount sanguinous drainage (278 cc/24h) Skin: warm and dry, no rashes  Psych: A&Ox3   Lab Results:   Recent Labs  11/25/16 1901 11/26/16 0224  WBC 9.1 7.8  HGB 8.7* 8.5*  HCT 27.4* 26.6*  PLT 254 243   BMET  Recent Labs  11/25/16 0332 11/26/16 0224  NA 141 141  K 3.5 3.6  CL 99* 100*  CO2 33* 35*  GLUCOSE 129* 132*  BUN 27* 24*  CREATININE 1.66* 1.53*  CALCIUM 8.2* 8.0*   PT/INR No results for input(s): LABPROT, INR in the last 72 hours. CMP     Component Value Date/Time   NA 141 11/26/2016 0224   K 3.6 11/26/2016 0224   CL 100 (L) 11/26/2016 0224   CO2 35 (H) 11/26/2016 0224   GLUCOSE 132 (H) 11/26/2016 0224   BUN 24  (H) 11/26/2016 0224   CREATININE 1.53 (H) 11/26/2016 0224   CALCIUM 8.0 (L) 11/26/2016 0224   PROT 5.0 (L) 11/24/2016 0318   ALBUMIN 2.3 (L) 11/24/2016 0318   AST 47 (H) 11/24/2016 0318   ALT 44 11/24/2016 0318   ALKPHOS 149 (H) 11/24/2016 0318   BILITOT 1.1 11/24/2016 0318   GFRNONAA 42 (L) 11/26/2016 0224   GFRAA 49 (L) 11/26/2016 0224   Lipase     Component Value Date/Time   LIPASE 35 11/19/2016 1326       Studies/Results: Dg Chest 2 View  Result Date: 11/25/2016 CLINICAL DATA:  Shortness of Breath EXAM: CHEST  2 VIEW COMPARISON:  November 19, 2016 chest radiograph and chest CT November 19, 2016 FINDINGS: There are small pleural effusions bilaterally. There is persistent patchy infiltrate in the right base with new patchy consolidation in the medial left base. There is also bibasilar atelectatic change. The upper lung zones are clear. Heart is mildly enlarged with pulmonary vascularity within normal limits. There is aortic atherosclerosis. There is degenerative change in the thoracic spine. There is a focus of air at the right basal frontal lateral views which probably is above the diaphragm. It is difficult to entirely exclude a small focus of air below the diaphragm in this area given the position of air with respect to the diaphragm. IMPRESSION: 1. Increase in patchy infiltrate in the left base with small left pleural effusion. There  is a focal area of patchy infiltrate right base which is essentially stable. Small right pleural effusion. Bibasilar atelectasis. Mild cardiomegaly is noted.  There is aortic atherosclerosis. There is a focus of air at the right base, probably above the diaphragm. Given the possibility of pneumoperitoneum in this area, however, a left lateral decubitus abdomen image to assess for the possibility of pneumoperitoneum is felt to be advisable. These results will be called to the ordering clinician or representative by the Radiologist Assistant, and communication  documented in the PACS or zVision Dashboard. Aortic Atherosclerosis (ICD10-I70.0). Electronically Signed   By: Lowella Grip III M.D.   On: 11/25/2016 09:52   Dg Chest Port 1 View  Result Date: 11/25/2016 CLINICAL DATA:  Acute onset of difficulty breathing. Initial encounter. EXAM: PORTABLE CHEST 1 VIEW COMPARISON:  Chest radiograph performed earlier today at 9:50 a.m. FINDINGS: The lungs are well-aerated. Persistent right basilar and worsening retrocardiac airspace opacity raises concern for pneumonia. A small left pleural effusion is again noted. No pneumothorax is seen. The cardiomediastinal silhouette is borderline normal in size. No acute osseous abnormalities are identified. IMPRESSION: Persistent right basilar and worsening retrocardiac airspace opacity raises concern for worsening pneumonia. Small left pleural effusion. Electronically Signed   By: Garald Balding M.D.   On: 11/25/2016 22:07   Dg Abd Decub  Result Date: 11/25/2016 CLINICAL DATA:  Shortness of breath.  Recent cholecystectomy. EXAM: ABDOMEN - 1 VIEW DECUBITUS COMPARISON:  Chest radiographs November 25, 2016 FINDINGS: There is a small amount of pneumoperitoneum which may be of postoperative etiology given the history. There is no bowel dilatation or air-fluid levels suggesting bowel obstruction. There is a drain in the right upper quadrant. There is a fracture of the right ninth rib. There are old healed fractures of the tenth and eleventh ribs on the right. IMPRESSION: Small amount of pneumoperitoneum which may well be of postoperative etiology given the history. Bowel gas pattern unremarkable. Recent appearing fracture of the anterolateral right ninth rib. Old healed fractures tenth and eleventh ribs on the right. Drain in right upper quadrant region with surgical clips in this area. Electronically Signed   By: Lowella Grip III M.D.   On: 11/25/2016 12:49    Anti-infectives: Anti-infectives    Start     Dose/Rate Route  Frequency Ordered Stop   11/25/16 2000  vancomycin (VANCOCIN) 1,250 mg in sodium chloride 0.9 % 250 mL IVPB     1,250 mg 166.7 mL/hr over 90 Minutes Intravenous Every 24 hours 11/25/16 1857     11/25/16 2000  piperacillin-tazobactam (ZOSYN) IVPB 3.375 g     3.375 g 12.5 mL/hr over 240 Minutes Intravenous Every 8 hours 11/25/16 1857     11/25/16 1600  levofloxacin (LEVAQUIN) IVPB 500 mg  Status:  Discontinued     500 mg 100 mL/hr over 60 Minutes Intravenous Every 24 hours 11/25/16 1503 11/25/16 1517   11/25/16 1530  levofloxacin (LEVAQUIN) IVPB 750 mg     750 mg 100 mL/hr over 90 Minutes Intravenous Every 48 hours 11/25/16 1517     11/21/16 1630  cefTRIAXone (ROCEPHIN) 2 g in dextrose 5 % 50 mL IVPB  Status:  Discontinued     2 g 100 mL/hr over 30 Minutes Intravenous Every 24 hours 11/21/16 1557 11/22/16 1532   11/19/16 1515  ceFEPIme (MAXIPIME) 1 g in dextrose 5 % 50 mL IVPB     1 g 100 mL/hr over 30 Minutes Intravenous  Once 11/19/16 1503 11/19/16 1609  11/19/16 1515  vancomycin (VANCOCIN) IVPB 1000 mg/200 mL premix     1,000 mg 200 mL/hr over 60 Minutes Intravenous  Once 11/19/16 1503 11/19/16 1752   11/19/16 1511  ceFEPIme (MAXIPIME) 1 g injection    Comments:  [name]   : cabinet override      11/19/16 1511 11/20/16 0314     Assessment/Plan Acute on chronic calculous cholecystitis  s/p Procedure(s): LAPAROSCOPIC CHOLECYSTECTOMY (N/A) - per operating surgeon, necrotic liver/cystic stump w/ high amound intra-operative bleeding; risk for bile leak - tolerating low fat diet - JP OP 278 cc/24 h sanguinous, no bile appreciated in drain, OP trending down  - RUQ fullness, possibly 2/2 post-operative hematoma, very low suspicion for bile leak; given renal function, IV contrast not recommended for abdominal CT and would provide little diagnostic value. Will or RUQ U/S to further evaluate possible RUQ fluid/hematoma. If hematoma is present would manage with observation vs possible  aspiration (though given viscosity of hematomas, not always amenable to aspiration, and almost never amenable to drain placement).  - mobilize/IS - pain: tylenol PRN, Oxycodone 5 mg PRN  Possible PNA - SOB; CXR w/ R basilar infiltrate, on IV Levaquin per primary team, VQ scan pending  Normocytic anemia - IVF saline locked, hgb 8.5  Elevated troponin Chronic systolic CHF - CXR this AM pending  Hx NSVT DM HLD - crestor 10 mg HTN CKD II  FEN: low fat diet  ID: Cefepime x 1 dose 8/7, Ancef x 1 dose 8/9,  Leukocytosis resolved VTE: SCD's  Dispo: RUQ U/S, continue current diet, aggressive pulmonary toilet/IS Will require HH for drain care and OP F/U in our office in 1-2 weeks for possible drain removal.    LOS: 4 days    [name] , [facility] 11/26/2016, 8:12 AM Pager: [phone] Consults: [phone] Mon-Fri 7:00 am-4:30 pm Sat-Sun 7:00 am-11:30 am

## 2016-11-26 NOTE — Progress Notes (Addendum)
[facility]  PROGRESS NOTE  Dillon Andrews VFI:[id number] DOB: Sep 11, 1940 DOA: 11/19/2016 PCP: No primary care provider on file.   Brief HPI:   76 y.o. male with medical history significant of Systolic CHF with EF 41-66% appears to be NICM with only minimal non-occlusive CAD on cath in June of this year; Esophageal varices due to presumed non alcoholic cirrhosis of liver followed by GI Dr. Marin Comment; DM2, HTN, HLD, CKD stage 3 with baseline creat of 1.5; NSVT started on Amiodarone in June after heart cath, Amiodarone stopped during hospital stay this past week due to epigastric discomfort and nausea.  Patient was just hospitalized at [facility] this past week with acute on chronic systolic CHF exacerbation.  His breathing improved significantly with diuresis.  During that hospital stay he was noted to have a RML consolidation and R pleural effusion.  No fevers, no SIRS, completed course of ABx, pro calcitonin was negative.  Ultimately this was felt to be due to asymmetric pulmonary edema.  Patient was discharged from hospital yesterday, family didn't want him to go back to [facility].  Patient was seen by GI, and GI recommended surgical consultation for symptomatic gallstones. General surgery saw the patient and patient underwent laparoscopic cholecystectomy. Patient was started on a full liquid diet and was tolerating it well. JP drain was placed and patient had serosanguineous discharge. Yesterday patient complained of shortness of breath and became hypotensive. Hemoglobin had dropped to 8.5. Chest x-ray showed persistent right lower lobe infiltrate and new left lower lobe infiltrate. Patient was empirically started on vancomycin, Zosyn, Levaquin for healthcare associated pneumonia. VQ scan was ordered to rule out pulmonary embolism.  Subjective   Patient seen and examined, continues to have mild shortness of breath. Complains of fullness and discomfort in  right upper quadrant.   Assessment/Plan:      1. Status post laparoscopic cholecystectomy- patient presented with nausea/epigastric pain- he  had negative GI emptying study during previous hospital stay, HIDA scan also was negative. Abdominal ultrasound showed cholelithiasis,But no cholecystitis patient does have history of elevated liver enzymes .GI was consulted and did recommend general surgery consultation for possible symptomatic cholelithiasis, General surgery saw the patient and patient underwent laparoscopic cholecystectomy.  2. Elevated troponin- patient had mild elevation of troponin, cardiac cath  done in [facility] in June showed minimal nonocclusive CAD. He denies chest pain. Cardiology was consulted, no plan for any aggressive intervention at this time.  3. Dyspnea-? Pneumonia vs PE- CT chest at the time of admission showed atelectasis versus pneumonia, minimal effusion. Status post thoracentesis at [facility]. No antibiotics were initiated by admitting physician,I also felt that patient did not have pneumonia as he did not have leukocytosis, no shortness of breath. But he developed shortness of breath last night after surgery.chest x-ray done on 11/25/2016 shows persistent right lower lobe infiltrate and new left lower lobe infiltrate. Patient empirically started on vancomycin, Zosyn and Levaquin. Procalcitonin less than 0.10. Follow blood culture results. If patient continues to do well consider narrow down antibiotics in next 48 hours to Levaquin. Incidentally patient also completed 10 days of antibiotics before coming to [facility]. He will need a follow-up chest x-ray in 4 weeks to check for resolution of these infiltrates. Follow VQ scan results.  4. ? Pneumoperitoneum- likely postoperative, discussed with Dr. Hulen Skains, chest x-ray showed focus of air at the right base above the diaphragm, given the possibility of pneumoperitoneum left lateral decubitus  abdominal  film was ordered which showed small amount of pneumoperitoneum, likely postoperative.   5. Anemia- acute blood loss anemia, postoperative. Hemoglobin has dropped from 14  on 11/21/2016 to 8.5 today.ultrasound of the right upper quadrant shows no hematoma, general surgery following. Transfuse when necessary for hemoglobin less than 7.0   6. Chronic systolic CHF- stable, euvolemic.  patient was  On  furosemide 40 mg by mouth twice a day, which is currently on hold due to hypotension.consider restarting Lasix once patient's blood pressure improves. He is also on gentle IV hydration. Monitor strict intake and output   7. History of NSVT- amiodarone was  stopped due to intolerance during last hospital stay at [facility].  8. Diabetes mellitus- continue sliding scale insulin.Blood glucose is 123, 135.  9. Hyperlipidemia- continue Crestor.  10. Hypertension-blood pressure is stable, continue metoprolol  11. CKD stage III- creatinine is 1.53 today. Improved from 1.66 yesterday.    DVT prophylaxis: SCD's  Code Status: Full code  Family Communication: no family present at bedside  Disposition Plan: Likely home in 1-2 days   Consultants:  None  Procedures:  None  Continuous infusions . sodium chloride 75 mL/hr at 11/25/16 2351  . levofloxacin (LEVAQUIN) IV Stopped (11/25/16 1900)  . piperacillin-tazobactam (ZOSYN)  IV 3.375 g (11/26/16 1314)  . vancomycin Stopped (11/26/16 0035)      Antibiotics:   Anti-infectives    Start     Dose/Rate Route Frequency Ordered Stop   11/25/16 2000  vancomycin (VANCOCIN) 1,250 mg in sodium chloride 0.9 % 250 mL IVPB     1,250 mg 166.7 mL/hr over 90 Minutes Intravenous Every 24 hours 11/25/16 1857     11/25/16 2000  piperacillin-tazobactam (ZOSYN) IVPB 3.375 g     3.375 g 12.5 mL/hr over 240 Minutes Intravenous Every 8 hours 11/25/16 1857     11/25/16 1600  levofloxacin (LEVAQUIN) IVPB 500 mg  Status:  Discontinued     500  mg 100 mL/hr over 60 Minutes Intravenous Every 24 hours 11/25/16 1503 11/25/16 1517   11/25/16 1530  levofloxacin (LEVAQUIN) IVPB 750 mg     750 mg 100 mL/hr over 90 Minutes Intravenous Every 48 hours 11/25/16 1517     11/21/16 1630  cefTRIAXone (ROCEPHIN) 2 g in dextrose 5 % 50 mL IVPB  Status:  Discontinued     2 g 100 mL/hr over 30 Minutes Intravenous Every 24 hours 11/21/16 1557 11/22/16 1532   11/19/16 1515  ceFEPIme (MAXIPIME) 1 g in dextrose 5 % 50 mL IVPB     1 g 100 mL/hr over 30 Minutes Intravenous  Once 11/19/16 1503 11/19/16 1609   11/19/16 1515  vancomycin (VANCOCIN) IVPB 1000 mg/200 mL premix     1,000 mg 200 mL/hr over 60 Minutes Intravenous  Once 11/19/16 1503 11/19/16 1752   11/19/16 1511  ceFEPIme (MAXIPIME) 1 g injection    Comments:  [name]   : cabinet override      11/19/16 1511 11/20/16 0314       Objective   Vitals:   11/26/16 0831 11/26/16 0900 11/26/16 0901 11/26/16 1245  BP: (!) 100/54 (!) 85/64 (!) 114/51 (!) 101/57  Pulse: 63 (!) 45  64  Resp: 16 20  (!) 22  Temp: 98.3 F (36.8 C)   98.2 F (36.8 C)  TempSrc: Oral   Oral  SpO2: 95% 93%  100%  Weight:      Height:        Intake/Output  Summary (Last 24 hours) at 11/26/16 1416 Last data filed at 11/26/16 1229  Gross per 24 hour  Intake              455 ml  Output             3158 ml  Net            -2703 ml   Filed Weights   11/24/16 0433 11/25/16 0514 11/26/16 0443  Weight: 84.9 kg (187 lb 3.2 oz) 84.4 kg (186 lb 1.6 oz) 84.5 kg (186 lb 4.8 oz)     Physical Examination:  Physical Exam: Eyes: No icterus, extraocular muscles intact  Mouth: Oral mucosa is moist, no lesions on palate,  Neck: Supple, no deformities, masses, or tenderness Lungs: Normal respiratory effort, bilateral clear to auscultation, no crackles or wheezes.  Heart: Regular rate and rhythm, S1 and S2 normal, no murmurs, rubs auscultated Abdomen: BS normoactive,soft, positive right upper quadrant tenderness to  palpation Extremities: No pretibial edema, no erythema, no cyanosis, no clubbing Neuro : Alert and oriented to time, place and person, No focal deficits Skin: No rashes seen on exam    Data Reviewed: I have personally reviewed following labs and imaging studies  CBG:  Recent Labs Lab 11/25/16 0740 11/25/16 1151 11/25/16 1631 11/26/16 0829 11/26/16 1242  GLUCAP 121* 182* 141* 135* 123*    CBC:  Recent Labs Lab 11/23/16 0913 11/24/16 0318 11/25/16 0332 11/25/16 1901 11/26/16 0224  WBC 16.1* 14.7* 9.8 9.1 7.8  HGB 10.8* 9.6* 8.9* 8.7* 8.5*  HCT 32.5* 29.5* 27.3* 27.4* 26.6*  MCV 95.6 95.5 95.5 96.8 96.7  PLT 261 241 243 254 174    Basic Metabolic Panel:  Recent Labs Lab 11/21/16 0249 11/23/16 0556 11/24/16 0318 11/25/16 0332 11/26/16 0224  NA 142 138 139 141 141  K 3.7 4.3 3.8 3.5 3.6  CL 100* 98* 98* 99* 100*  CO2 29 29 35* 33* 35*  GLUCOSE 133* 163* 135* 129* 132*  BUN 21* 29* 31* 27* 24*  CREATININE 1.56* 1.82* 1.79* 1.66* 1.53*  CALCIUM 8.7* 8.5* 8.1* 8.2* 8.0*    Recent Results (from the past 240 hour(s))  Surgical pcr screen     Status: None   Collection Time: 11/21/16  4:16 PM  Result Value Ref Range Status   MRSA, PCR NEGATIVE NEGATIVE Final   Staphylococcus aureus NEGATIVE NEGATIVE Final    Comment:        The Xpert SA Assay (FDA approved for NASAL specimens in patients over 78 years of age), is one component of a comprehensive surveillance program.  Test performance has been validated by [facility] for patients greater than or equal to 77 year old. It is not intended to diagnose infection nor to guide or monitor treatment.      Liver Function Tests:  Recent Labs Lab 11/20/16 0114 11/21/16 0249 11/23/16 0556 11/24/16 0318  AST 52* 57* 63* 47*  ALT 50 56 47 44  ALKPHOS 194* 217* 157* 149*  BILITOT 1.1 1.4* 1.4* 1.1  PROT 5.4* 6.1* 5.5* 5.0*  ALBUMIN 2.7* 3.1* 2.5* 2.3*   No results for input(s): LIPASE, AMYLASE in the  last 168 hours. No results for input(s): AMMONIA in the last 168 hours.  Cardiac Enzymes:  Recent Labs Lab 11/19/16 2057 11/20/16 0114 11/20/16 0738  TROPONINI 0.29* 0.31* 0.31*   BNP (last 3 results)  Recent Labs  11/19/16 1326  BNP 439.5*    ProBNP (last 3 results) No results  for input(s): PROBNP in the last 8760 hours.    Studies: Dg Chest 2 View  Result Date: 11/25/2016 CLINICAL DATA:  Shortness of Breath EXAM: CHEST  2 VIEW COMPARISON:  November 19, 2016 chest radiograph and chest CT November 19, 2016 FINDINGS: There are small pleural effusions bilaterally. There is persistent patchy infiltrate in the right base with new patchy consolidation in the medial left base. There is also bibasilar atelectatic change. The upper lung zones are clear. Heart is mildly enlarged with pulmonary vascularity within normal limits. There is aortic atherosclerosis. There is degenerative change in the thoracic spine. There is a focus of air at the right basal frontal lateral views which probably is above the diaphragm. It is difficult to entirely exclude a small focus of air below the diaphragm in this area given the position of air with respect to the diaphragm. IMPRESSION: 1. Increase in patchy infiltrate in the left base with small left pleural effusion. There is a focal area of patchy infiltrate right base which is essentially stable. Small right pleural effusion. Bibasilar atelectasis. Mild cardiomegaly is noted.  There is aortic atherosclerosis. There is a focus of air at the right base, probably above the diaphragm. Given the possibility of pneumoperitoneum in this area, however, a left lateral decubitus abdomen image to assess for the possibility of pneumoperitoneum is felt to be advisable. These results will be called to the ordering clinician or representative by the Radiologist Assistant, and communication documented in the PACS or zVision Dashboard. Aortic Atherosclerosis (ICD10-I70.0).  Electronically Signed   By: Lowella Grip III M.D.   On: 11/25/2016 09:52   Dg Chest Port 1 View  Result Date: 11/25/2016 CLINICAL DATA:  Acute onset of difficulty breathing. Initial encounter. EXAM: PORTABLE CHEST 1 VIEW COMPARISON:  Chest radiograph performed earlier today at 9:50 a.m. FINDINGS: The lungs are well-aerated. Persistent right basilar and worsening retrocardiac airspace opacity raises concern for pneumonia. A small left pleural effusion is again noted. No pneumothorax is seen. The cardiomediastinal silhouette is borderline normal in size. No acute osseous abnormalities are identified. IMPRESSION: Persistent right basilar and worsening retrocardiac airspace opacity raises concern for worsening pneumonia. Small left pleural effusion. Electronically Signed   By: Garald Balding M.D.   On: 11/25/2016 22:07   Dg Abd Decub  Result Date: 11/25/2016 CLINICAL DATA:  Shortness of breath.  Recent cholecystectomy. EXAM: ABDOMEN - 1 VIEW DECUBITUS COMPARISON:  Chest radiographs November 25, 2016 FINDINGS: There is a small amount of pneumoperitoneum which may be of postoperative etiology given the history. There is no bowel dilatation or air-fluid levels suggesting bowel obstruction. There is a drain in the right upper quadrant. There is a fracture of the right ninth rib. There are old healed fractures of the tenth and eleventh ribs on the right. IMPRESSION: Small amount of pneumoperitoneum which may well be of postoperative etiology given the history. Bowel gas pattern unremarkable. Recent appearing fracture of the anterolateral right ninth rib. Old healed fractures tenth and eleventh ribs on the right. Drain in right upper quadrant region with surgical clips in this area. Electronically Signed   By: Lowella Grip III M.D.   On: 11/25/2016 12:49   US Abdomen Limited Ruq  Result Date: 11/26/2016 CLINICAL DATA:  Recent laparoscopic cholecystectomy for gangrenous cholecystitis. Evaluate for  postoperative hematoma. EXAM: ULTRASOUND ABDOMEN LIMITED RIGHT UPPER QUADRANT COMPARISON:  Abdominal ultrasound dated November 19, 2016. FINDINGS: Gallbladder: Surgically absent. Surgical drain in place. No definite fluid collection seen in the gallbladder fossa.  Common bile duct: Diameter: 4.4 mm, normal. Liver: No focal lesion identified. Within normal limits in parenchymal echogenicity. Incidental note is made of a 2.5 x 2.2 x 2.9 cm anechoic simple cyst in the upper pole of the right kidney. Small right pleural effusion. IMPRESSION: 1. Status post cholecystectomy without definite fluid collection in the gallbladder fossa. 2. Small right pleural effusion. Electronically Signed   By: Titus Dubin M.D.   On: 11/26/2016 10:56    Scheduled Meds: . aspirin  81 mg Oral Daily  . gabapentin  100 mg Oral QHS  . insulin aspart  0-9 Units Subcutaneous TID WC  . pantoprazole  80 mg Oral Daily  . potassium chloride  20 mEq Oral Daily  . rosuvastatin  10 mg Oral Daily     Time spent: 25 min  [facility] Pager [phone]. If 7PM-7AM, please contact night-coverage at [email or web address], Office  [phone]  password [email or web address]  11/26/2016, 2:16 PM  LOS: 4 days

## 2016-11-26 NOTE — Progress Notes (Signed)
Patient complained of epigastric pressure when laying back in bed this pressure subsided when patient repositioned to sit up.  O2 sats= 98-100%.  Dr Darrick Meigs notified he stated he feels this discomfort is surgical related. NO new orders received.

## 2016-11-26 NOTE — Progress Notes (Signed)
Pt arrived to floor complaining of right sided tenderness and SOB. Surgery contacted and informed of pt's right sided/flank tenderness and swelling, SOB, recent ABG results, Hgb, JP output and VS. No order for CT of abdomen at this time. Portable CXR ordered. Fluids decreased to 75 ml/hr. Repeat lactic and CBC scheduled. Will continue to monitor.

## 2016-11-26 NOTE — Care Management Important Message (Signed)
Important Message  Patient Details  Name: Dillon Andrews MRN: [id number] Date of Birth: 1940-10-18   Medicare Important Message Given:  Yes    Nathen May 11/26/2016, 10:36 AM

## 2016-11-27 LAB — COMPREHENSIVE METABOLIC PANEL
ALBUMIN: 2.4 g/dL — AB (ref 3.5–5.0)
ALK PHOS: 149 U/L — AB (ref 38–126)
ALT: 51 U/L (ref 17–63)
AST: 57 U/L — ABNORMAL HIGH (ref 15–41)
Anion gap: 9 (ref 5–15)
BUN: 19 mg/dL (ref 6–20)
CHLORIDE: 101 mmol/L (ref 101–111)
CO2: 31 mmol/L (ref 22–32)
Calcium: 8.1 mg/dL — ABNORMAL LOW (ref 8.9–10.3)
Creatinine, Ser: 1.5 mg/dL — ABNORMAL HIGH (ref 0.61–1.24)
GFR calc non Af Amer: 43 mL/min — ABNORMAL LOW (ref >60)
GFR, EST AFRICAN AMERICAN: 50 mL/min — AB (ref >60)
Glucose, Bld: 118 mg/dL — ABNORMAL HIGH (ref 65–99)
Potassium: 3.7 mmol/L (ref 3.5–5.1)
SODIUM: 141 mmol/L (ref 135–145)
Total Bilirubin: 1.5 mg/dL — ABNORMAL HIGH (ref 0.3–1.2)
Total Protein: 5.4 g/dL — ABNORMAL LOW (ref 6.5–8.1)

## 2016-11-27 LAB — GLUCOSE, CAPILLARY
GLUCOSE-CAPILLARY: 117 mg/dL — AB (ref 65–99)
GLUCOSE-CAPILLARY: 169 mg/dL — AB (ref 65–99)
GLUCOSE-CAPILLARY: 164 mg/dL — AB (ref 65–99)
Glucose-Capillary: 148 mg/dL — ABNORMAL HIGH (ref 65–99)

## 2016-11-27 LAB — CBC
HCT: 27.7 % — ABNORMAL LOW (ref 39.0–52.0)
HEMOGLOBIN: 8.8 g/dL — AB (ref 13.0–17.0)
MCH: 30.9 pg (ref 26.0–34.0)
MCHC: 31.8 g/dL (ref 30.0–36.0)
MCV: 97.2 fL (ref 78.0–100.0)
Platelets: 235 10*3/uL (ref 150–400)
RBC: 2.85 MIL/uL — AB (ref 4.22–5.81)
RDW: 15.7 % — ABNORMAL HIGH (ref 11.5–15.5)
WBC: 7.4 10*3/uL (ref 4.0–10.5)

## 2016-11-27 LAB — PROCALCITONIN: Procalcitonin: <0.1 ng/mL

## 2016-11-27 MED ORDER — ZOLPIDEM TARTRATE 5 MG PO TABS
5.0000 mg | ORAL_TABLET | Freq: Once | ORAL | Status: AC
  Administered 2016-11-27: 5 mg via ORAL
  Filled 2016-11-27: qty 1

## 2016-11-27 MED ORDER — POLYETHYLENE GLYCOL 3350 17 G PO PACK
17.0000 g | PACK | Freq: Every day | ORAL | Status: DC
  Administered 2016-11-27: 17 g via ORAL
  Filled 2016-11-27: qty 1

## 2016-11-27 NOTE — Plan of Care (Signed)
Problem: Safety: Goal: Ability to remain free from injury will improve Outcome: Progressing Fall risk bundle in place. No falls, injuries or skin break down this shift.   Problem: Skin Integrity: Goal: Risk for impaired skin integrity will decrease Outcome: Progressing Pt repositioned in bed, foam pad on sacrum along with a waffle pad. Pt resting comfortably in bed. No other needs or concerns at this time.   Problem: Tissue Perfusion: Goal: Risk factors for ineffective tissue perfusion will decrease Outcome: Progressing No complaints of sob this shift. Pt O2 is at 96% 2L [place]. VS stable. Dressing changed 1X this shift. JP drain continues to have sanguinous output. Hgb up from  8.5 to 8.8 today.

## 2016-11-27 NOTE — Progress Notes (Signed)
[facility] Progress Note  5 Days Post-Op  Subjective: CC:  Feels better today. Denies SOB or abdominal pain. Reports one episode of epigastric discomfort last night that improved when he changed positions in bed. Ate almost 100% breakfast tray this AM. +flatus, feels like he needs to have a BM but has not had one. Denies fever, chills, nausea, vomiting. States drain OP decreasing. Mobilizing in room, not in hall.   Objective: Vital signs in last 24 hours: Temp:  [98.2 F (36.8 C)-98.5 F (36.9 C)] 98.4 F (36.9 C) (08/15 0544) Pulse Rate:  [62-69] 66 (08/15 0544) Resp:  [16-29] 27 (08/15 0544) BP: (101-116)/(53-72) 115/66 (08/15 0748) SpO2:  [98 %-100 %] 99 % (08/15 0544) Weight:  [84 kg (185 lb 1.6 oz)] 84 kg (185 lb 1.6 oz) (08/15 0544) Last BM Date: 11/21/16  Intake/Output from previous day: 08/14 0701 - 08/15 0700 In: 180 [P.O.:180] Out: 2320 [Urine:2055; Drains:265] Intake/Output this shift: Total I/O In: 5 [IV Piggyback:50] Out: 40 [Drains:40]  PE: Gen:  Alert, NAD, pleasant Card:  Regular rate and rhythm Pulm:  Normal effort Abd: Soft, mild TTP RUQ/RLQ around drain, non-distended, bowel sounds present in all 4 quadrants, no HSM, incisions C/D/I, JP drain with minimal sanguinous output.  JP drain: 265 cc/24h Skin: warm and dry, no rashes  Psych: A&Ox3   Lab Results:   Recent Labs  11/26/16 0224 11/27/16 0500  WBC 7.8 7.4  HGB 8.5* 8.8*  HCT 26.6* 27.7*  PLT 243 235   BMET  Recent Labs  11/26/16 0224 11/27/16 0500  NA 141 141  K 3.6 3.7  CL 100* 101  CO2 35* 31  GLUCOSE 132* 118*  BUN 24* 19  CREATININE 1.53* 1.50*  CALCIUM 8.0* 8.1*   PT/INR No results for input(s): LABPROT, INR in the last 72 hours. CMP     Component Value Date/Time   NA 141 11/27/2016 0500   K 3.7 11/27/2016 0500   CL 101 11/27/2016 0500   CO2 31 11/27/2016 0500   GLUCOSE 118 (H) 11/27/2016 0500   BUN 19 11/27/2016 0500   CREATININE 1.50 (H) 11/27/2016  0500   CALCIUM 8.1 (L) 11/27/2016 0500   PROT 5.4 (L) 11/27/2016 0500   ALBUMIN 2.4 (L) 11/27/2016 0500   AST 57 (H) 11/27/2016 0500   ALT 51 11/27/2016 0500   ALKPHOS 149 (H) 11/27/2016 0500   BILITOT 1.5 (H) 11/27/2016 0500   GFRNONAA 43 (L) 11/27/2016 0500   GFRAA 50 (L) 11/27/2016 0500   Lipase     Component Value Date/Time   LIPASE 35 11/19/2016 1326       Studies/Results: Dg Chest 2 View  Result Date: 11/25/2016 CLINICAL DATA:  Shortness of Breath EXAM: CHEST  2 VIEW COMPARISON:  November 19, 2016 chest radiograph and chest CT November 19, 2016 FINDINGS: There are small pleural effusions bilaterally. There is persistent patchy infiltrate in the right base with new patchy consolidation in the medial left base. There is also bibasilar atelectatic change. The upper lung zones are clear. Heart is mildly enlarged with pulmonary vascularity within normal limits. There is aortic atherosclerosis. There is degenerative change in the thoracic spine. There is a focus of air at the right basal frontal lateral views which probably is above the diaphragm. It is difficult to entirely exclude a small focus of air below the diaphragm in this area given the position of air with respect to the diaphragm. IMPRESSION: 1. Increase in patchy infiltrate in the left base  with small left pleural effusion. There is a focal area of patchy infiltrate right base which is essentially stable. Small right pleural effusion. Bibasilar atelectasis. Mild cardiomegaly is noted.  There is aortic atherosclerosis. There is a focus of air at the right base, probably above the diaphragm. Given the possibility of pneumoperitoneum in this area, however, a left lateral decubitus abdomen image to assess for the possibility of pneumoperitoneum is felt to be advisable. These results will be called to the ordering clinician or representative by the Radiologist Assistant, and communication documented in the PACS or zVision Dashboard. Aortic  Atherosclerosis (ICD10-I70.0). Electronically Signed   By: Lowella Grip III M.D.   On: 11/25/2016 09:52   Nm Pulmonary Perf And Vent  Result Date: 11/26/2016 CLINICAL DATA:  Shortness of Breath EXAM: NUCLEAR MEDICINE VENTILATION - PERFUSION LUNG SCAN TECHNIQUE: Ventilation images were obtained in multiple projections using inhaled aerosol Tc-59mDTPA. Perfusion images were obtained in multiple projections after intravenous injection of Tc-920mAA. RADIOPHARMACEUTICALS:  31.0 mCi Technetium-9922mPA aerosol inhalation and 4.2 mCi Technetium-50m3m IV COMPARISON:  None. FINDINGS: Ventilation: No focal ventilation defect. Perfusion: No wedge shaped peripheral perfusion defects to suggest acute pulmonary embolism. IMPRESSION: No evidence of pulmonary embolus. Electronically Signed   By: KeviRolm Baptise.   On: 11/26/2016 14:49   Dg Chest Port 1 View  Result Date: 11/25/2016 CLINICAL DATA:  Acute onset of difficulty breathing. Initial encounter. EXAM: PORTABLE CHEST 1 VIEW COMPARISON:  Chest radiograph performed earlier today at 9:50 a.m. FINDINGS: The lungs are well-aerated. Persistent right basilar and worsening retrocardiac airspace opacity raises concern for pneumonia. A small left pleural effusion is again noted. No pneumothorax is seen. The cardiomediastinal silhouette is borderline normal in size. No acute osseous abnormalities are identified. IMPRESSION: Persistent right basilar and worsening retrocardiac airspace opacity raises concern for worsening pneumonia. Small left pleural effusion. Electronically Signed   By: JeffGarald Balding.   On: 11/25/2016 22:07   Dg Abd Decub  Result Date: 11/25/2016 CLINICAL DATA:  Shortness of breath.  Recent cholecystectomy. EXAM: ABDOMEN - 1 VIEW DECUBITUS COMPARISON:  Chest radiographs November 25, 2016 FINDINGS: There is a small amount of pneumoperitoneum which may be of postoperative etiology given the history. There is no bowel dilatation or air-fluid levels  suggesting bowel obstruction. There is a drain in the right upper quadrant. There is a fracture of the right ninth rib. There are old healed fractures of the tenth and eleventh ribs on the right. IMPRESSION: Small amount of pneumoperitoneum which may well be of postoperative etiology given the history. Bowel gas pattern unremarkable. Recent appearing fracture of the anterolateral right ninth rib. Old healed fractures tenth and eleventh ribs on the right. Drain in right upper quadrant region with surgical clips in this area. Electronically Signed   By: WillLowella Grip M.D.   On: 11/25/2016 12:49   Us [place] Limited Ruq  Result Date: 11/26/2016 CLINICAL DATA:  Recent laparoscopic cholecystectomy for gangrenous cholecystitis. Evaluate for postoperative hematoma. EXAM: ULTRASOUND ABDOMEN LIMITED RIGHT UPPER QUADRANT COMPARISON:  Abdominal ultrasound dated November 19, 2016. FINDINGS: Gallbladder: Surgically absent. Surgical drain in place. No definite fluid collection seen in the gallbladder fossa. Common bile duct: Diameter: 4.4 mm, normal. Liver: No focal lesion identified. Within normal limits in parenchymal echogenicity. Incidental note is made of a 2.5 x 2.2 x 2.9 cm anechoic simple cyst in the upper pole of the right kidney. Small right pleural effusion. IMPRESSION: 1. Status post cholecystectomy without definite fluid  collection in the gallbladder fossa. 2. Small right pleural effusion. Electronically Signed   By: Titus Dubin M.D.   On: 11/26/2016 10:56    Anti-infectives: Anti-infectives    Start     Dose/Rate Route Frequency Ordered Stop   11/25/16 2000  vancomycin (VANCOCIN) 1,250 mg in sodium chloride 0.9 % 250 mL IVPB  Status:  Discontinued     1,250 mg 166.7 mL/hr over 90 Minutes Intravenous Every 24 hours 11/25/16 1857 11/27/16 0918   11/25/16 2000  piperacillin-tazobactam (ZOSYN) IVPB 3.375 g     3.375 g 12.5 mL/hr over 240 Minutes Intravenous Every 8 hours 11/25/16 1857      11/25/16 1600  levofloxacin (LEVAQUIN) IVPB 500 mg  Status:  Discontinued     500 mg 100 mL/hr over 60 Minutes Intravenous Every 24 hours 11/25/16 1503 11/25/16 1517   11/25/16 1530  levofloxacin (LEVAQUIN) IVPB 750 mg  Status:  Discontinued     750 mg 100 mL/hr over 90 Minutes Intravenous Every 48 hours 11/25/16 1517 11/27/16 0918   11/21/16 1630  cefTRIAXone (ROCEPHIN) 2 g in dextrose 5 % 50 mL IVPB  Status:  Discontinued     2 g 100 mL/hr over 30 Minutes Intravenous Every 24 hours 11/21/16 1557 11/22/16 1532   11/19/16 1515  ceFEPIme (MAXIPIME) 1 g in dextrose 5 % 50 mL IVPB     1 g 100 mL/hr over 30 Minutes Intravenous  Once 11/19/16 1503 11/19/16 1609   11/19/16 1515  vancomycin (VANCOCIN) IVPB 1000 mg/200 mL premix     1,000 mg 200 mL/hr over 60 Minutes Intravenous  Once 11/19/16 1503 11/19/16 1752   11/19/16 1511  ceFEPIme (MAXIPIME) 1 g injection    Comments:  [name]   : cabinet override      11/19/16 1511 11/20/16 0314     Assessment/Plan Possible PNA - SOB; CXR w/ R basilar infiltrate, on IV abx; VQ negative for PE Normocytic anemia - IVF saline locked, hgb 8.8 from 8.5, improved Elevated troponin Chronic systolic CHF - CXR this AM pending  Hx NSVT DM HLD - crestor 10 mg HTN CKD II  Acute on chronic calculous cholecystitis  s/p Procedure(s): POD# 5 LAPAROSCOPIC CHOLECYSTECTOMY (N/A) 8/10 Dr. Nathaneil Canary blackman  - per operating surgeon, necrotic liver/cystic stump w/ high amound intra-operative bleeding; risk for bile leak - tolerating low fat diet - JP OP 265 cc/24 h sanguinous, no evidence of bile in drain; possible removal JP drain prior to hospital discharge, will discuss with MD. - RUQ fullness - U/S negative for fluid collection/hematoma. - pain: tylenol PRN, Oxycodone 5 mg PRN  FEN: low fat diet  ID: Cefepime x 1 dose 8/7, Ancef x 1 dose 8/9, started on empiric Vanc/Zosyn/Levaquin 8/14 for HCAP VTE: SCD's  Dispo: continue current diet and  aggressive pulmonary toilet/IS; Tx PNA per primary team Approaching readiness for discharge from surgical perspective - will discuss timing of drain removal with MD.   LOS: 5 days    [name] , [facility] Surgery 11/27/2016, 9:37 AM Pager: [phone] Consults: [phone] Mon-Fri 7:00 am-4:30 pm Sat-Sun 7:00 am-11:30 am

## 2016-11-27 NOTE — Progress Notes (Signed)
[facility]  PROGRESS NOTE  Dillon Andrews KPT:[id number] DOB: Aug 31, 1940 DOA: 11/19/2016 PCP: No primary care provider on file.   Brief HPI:   76 y.o. male with medical history significant of Systolic CHF with EF 17-00% appears to be NICM with only minimal non-occlusive CAD on cath in June of this year; Esophageal varices due to presumed non alcoholic cirrhosis of liver followed by GI Dr. Marin Comment; DM2, HTN, HLD, CKD stage 3 with baseline creat of 1.5; NSVT started on Amiodarone in June after heart cath, Amiodarone stopped during hospital stay this past week due to epigastric discomfort and nausea.  Patient was just hospitalized at [facility] this past week with acute on chronic systolic CHF exacerbation.  His breathing improved significantly with diuresis.  During that hospital stay he was noted to have a RML consolidation and R pleural effusion.  No fevers, no SIRS, completed course of ABx, pro calcitonin was negative.  Ultimately this was felt to be due to asymmetric pulmonary edema.  Patient was discharged from hospital yesterday, family didn't want him to go back to [facility].  Patient was seen by GI, and GI recommended surgical consultation for symptomatic gallstones. General surgery saw the patient and patient underwent laparoscopic cholecystectomy. Patient was started on a full liquid diet and was tolerating it well. JP drain was placed and patient had serosanguineous discharge.  During hospital stay he desatted, and developed leukocytosis and was found to have probable pneumonia  Subjective   Looks good feels good About to eat eat big potato in [place] stew No nausea no vomiting Output still remains sanguinous No chest pain Just had a medium BM   Assessment/Plan:     1. Status post laparoscopic cholecystectomy- patient presented with nausea/epigastric pain- he  had negative GI emptying study during previous hospital stay, HIDA scan also was negative. Abdominal ultrasound showed  cholelithiasis,But no cholecystitis patient does have history of elevated liver enzymes .GI was consulted and did recommend general surgery consultation for possible symptomatic cholelithiasis\Patient is status post laparoscopic cholecystectomy and drain will probably be removed 8/16 as patient is doing very well-he is tolerating diet we will keep on IV fluids today and discontinue a.m.  2. Elevated troponin- patient had mild elevation of troponin, cardiac cath  done in [facility] in June showed minimal nonocclusive CAD. He denies chest pain. Cardiology was consulted, no plan for any aggressive intervention at this time.  Dyspnea-? Pneumonia vs PE- CT chest at the time of admission showed atelectasis versus pneumonia, minimal effusion. Status post thoracentesis at [facility] hospital. Antibiotics were transitioned from vancomycin and Zosyn to Zosyn monotherapy 8/15 VQ scan is negative for pulmonary embolus We will cover for the expected duration 7-10 days postop and he will need a repeat x-ray in about a month  3. ? Pneumoperitoneum- likely postoperative, discussed with Dr. Hulen Skains patient passing stool now no further workup.   4. Anemia- acute blood loss anemia, postoperative. Hemoglobin has dropped from 14  on 11/21/2016 to 8.8  5. Chronic systolic CHF- stable, euvolemic.  patient was  On  furosemide 40 mg by mouth twice a day, which is currently on hold due to hypotension.consider restarting Lasix once patient's blood pressure improves. He is also on gentle IV hydration. Monitor strict intake and output   6. History of NSVT- amiodarone was  stopped due to intolerance during last hospital stay at [facility] hospital.  7. Diabetes mellitus- continue sliding scale insulin.Blood glucose is 123, 135.  8.  Hyperlipidemia- continue Crestor.  9. Hypertension-blood pressure is stable, continue metoprolol  10. CKD stage III- creatinine is 1.53 today. Improved from 1.66  yesterday.    DVT prophylaxis: SCD's  Code Status: Full code  Family Communication: No family present will 8 AM the patient has a wife and 2 daughters  Disposition Plan: Likely home in 1-2 days   Consultants:  None  Procedures:  None  Continuous infusions . sodium chloride 75 mL/hr at 11/27/16 0120  . piperacillin-tazobactam (ZOSYN)  IV Stopped (11/27/16 1616)      Antibiotics:   Anti-infectives    Start     Dose/Rate Route Frequency Ordered Stop   11/25/16 2000  vancomycin (VANCOCIN) 1,250 mg in sodium chloride 0.9 % 250 mL IVPB  Status:  Discontinued     1,250 mg 166.7 mL/hr over 90 Minutes Intravenous Every 24 hours 11/25/16 1857 11/27/16 0918   11/25/16 2000  piperacillin-tazobactam (ZOSYN) IVPB 3.375 g     3.375 g 12.5 mL/hr over 240 Minutes Intravenous Every 8 hours 11/25/16 1857     11/25/16 1600  levofloxacin (LEVAQUIN) IVPB 500 mg  Status:  Discontinued     500 mg 100 mL/hr over 60 Minutes Intravenous Every 24 hours 11/25/16 1503 11/25/16 1517   11/25/16 1530  levofloxacin (LEVAQUIN) IVPB 750 mg  Status:  Discontinued     750 mg 100 mL/hr over 90 Minutes Intravenous Every 48 hours 11/25/16 1517 11/27/16 0918   11/21/16 1630  cefTRIAXone (ROCEPHIN) 2 g in dextrose 5 % 50 mL IVPB  Status:  Discontinued     2 g 100 mL/hr over 30 Minutes Intravenous Every 24 hours 11/21/16 1557 11/22/16 1532   11/19/16 1515  ceFEPIme (MAXIPIME) 1 g in dextrose 5 % 50 mL IVPB     1 g 100 mL/hr over 30 Minutes Intravenous  Once 11/19/16 1503 11/19/16 1609   11/19/16 1515  vancomycin (VANCOCIN) IVPB 1000 mg/200 mL premix     1,000 mg 200 mL/hr over 60 Minutes Intravenous  Once 11/19/16 1503 11/19/16 1752   11/19/16 1511  ceFEPIme (MAXIPIME) 1 g injection    Comments:  [name]   : cabinet override      11/19/16 1511 11/20/16 0314       Objective   Vitals:   11/27/16 0544 11/27/16 0748 11/27/16 1400 11/27/16 1500  BP: 110/63 115/66 (!) 97/51 (!) 101/53  Pulse:  66  63 67  Resp: (!) 27  (!) 24 (!) 26  Temp: 98.4 F (36.9 C)  97.6 F (36.4 C) 98.4 F (36.9 C)  TempSrc: Oral  Oral Oral  SpO2: 99%  98% 95%  Weight: 84 kg (185 lb 1.6 oz)     Height:        Intake/Output Summary (Last 24 hours) at 11/27/16 1645 Last data filed at 11/27/16 1630  Gross per 24 hour  Intake           1532.5 ml  Output             1595 ml  Net            -62.5 ml   Filed Weights   11/25/16 0514 11/26/16 0443 11/27/16 0544  Weight: 84.4 kg (186 lb 1.6 oz) 84.5 kg (186 lb 4.8 oz) 84 kg (185 lb 1.6 oz)     Physical Examination:  Physical Exam: Eyes: No icterus, extraocular muscles intact  Mouth: Oral mucosa is moist, no lesions on palate,  Neck: Supple, no deformities, masses, or  tenderness Lungs: Normal respiratory effort, bilateral clear to auscultation, no crackles or wheezes.  Heart: Regular rate and rhythm, S1 and S2 normal, no murmurs, rubs auscultated Abdomen: BS normoactive,soft, positive right upper quadrant tenderness to palpation Extremities: No pretibial edema, no erythema, no cyanosis, no clubbing Neuro : Alert and oriented to time, place and person, No focal deficits Skin: No rashes seen on exam    Data Reviewed: I have personally reviewed following labs and imaging studies  CBG:  Recent Labs Lab 11/26/16 1617 11/26/16 2130 11/27/16 0750 11/27/16 1134 11/27/16 1627  GLUCAP 162* 196* 117* 169* 148*    CBC:  Recent Labs Lab 11/24/16 0318 11/25/16 0332 11/25/16 1901 11/26/16 0224 11/27/16 0500  WBC 14.7* 9.8 9.1 7.8 7.4  HGB 9.6* 8.9* 8.7* 8.5* 8.8*  HCT 29.5* 27.3* 27.4* 26.6* 27.7*  MCV 95.5 95.5 96.8 96.7 97.2  PLT 241 243 254 243 762    Basic Metabolic Panel:  Recent Labs Lab 11/23/16 0556 11/24/16 0318 11/25/16 0332 11/26/16 0224 11/27/16 0500  NA 138 139 141 141 141  K 4.3 3.8 3.5 3.6 3.7  CL 98* 98* 99* 100* 101  CO2 29 35* 33* 35* 31  GLUCOSE 163* 135* 129* 132* 118*  BUN 29* 31* 27* 24* 19   CREATININE 1.82* 1.79* 1.66* 1.53* 1.50*  CALCIUM 8.5* 8.1* 8.2* 8.0* 8.1*    Recent Results (from the past 240 hour(s))  Surgical pcr screen     Status: None   Collection Time: 11/21/16  4:16 PM  Result Value Ref Range Status   MRSA, PCR NEGATIVE NEGATIVE Final   Staphylococcus aureus NEGATIVE NEGATIVE Final    Comment:        The Xpert SA Assay (FDA approved for NASAL specimens in patients over 46 years of age), is one component of a comprehensive surveillance program.  Test performance has been validated by [facility] for patients greater than or equal to 83 year old. It is not intended to diagnose infection nor to guide or monitor treatment.      Liver Function Tests:  Recent Labs Lab 11/21/16 0249 11/23/16 0556 11/24/16 0318 11/27/16 0500  AST 57* 63* 47* 57*  ALT 56 47 44 51  ALKPHOS 217* 157* 149* 149*  BILITOT 1.4* 1.4* 1.1 1.5*  PROT 6.1* 5.5* 5.0* 5.4*  ALBUMIN 3.1* 2.5* 2.3* 2.4*   No results for input(s): LIPASE, AMYLASE in the last 168 hours. No results for input(s): AMMONIA in the last 168 hours.  Cardiac Enzymes: No results for input(s): CKTOTAL, CKMB, CKMBINDEX, TROPONINI in the last 168 hours. BNP (last 3 results)  Recent Labs  11/19/16 1326  BNP 439.5*    ProBNP (last 3 results) No results for input(s): PROBNP in the last 8760 hours.    Studies: Nm Pulmonary Perf And Vent  Result Date: 11/26/2016 CLINICAL DATA:  Shortness of Breath EXAM: NUCLEAR MEDICINE VENTILATION - PERFUSION LUNG SCAN TECHNIQUE: Ventilation images were obtained in multiple projections using inhaled aerosol Tc-24mDTPA. Perfusion images were obtained in multiple projections after intravenous injection of Tc-975mAA. RADIOPHARMACEUTICALS:  31.0 mCi Technetium-9974mPA aerosol inhalation and 4.2 mCi Technetium-87m66m IV COMPARISON:  None. FINDINGS: Ventilation: No focal ventilation defect. Perfusion: No wedge shaped peripheral perfusion defects to suggest acute  pulmonary embolism. IMPRESSION: No evidence of pulmonary embolus. Electronically Signed   By: KeviRolm Baptise.   On: 11/26/2016 14:49   Dg Chest Port 1 View  Result Date: 11/25/2016 CLINICAL DATA:  Acute onset of difficulty  breathing. Initial encounter. EXAM: PORTABLE CHEST 1 VIEW COMPARISON:  Chest radiograph performed earlier today at 9:50 a.m. FINDINGS: The lungs are well-aerated. Persistent right basilar and worsening retrocardiac airspace opacity raises concern for pneumonia. A small left pleural effusion is again noted. No pneumothorax is seen. The cardiomediastinal silhouette is borderline normal in size. No acute osseous abnormalities are identified. IMPRESSION: Persistent right basilar and worsening retrocardiac airspace opacity raises concern for worsening pneumonia. Small left pleural effusion. Electronically Signed   By: Garald Balding M.D.   On: 11/25/2016 22:07   US Abdomen Limited Ruq  Result Date: 11/26/2016 CLINICAL DATA:  Recent laparoscopic cholecystectomy for gangrenous cholecystitis. Evaluate for postoperative hematoma. EXAM: ULTRASOUND ABDOMEN LIMITED RIGHT UPPER QUADRANT COMPARISON:  Abdominal ultrasound dated November 19, 2016. FINDINGS: Gallbladder: Surgically absent. Surgical drain in place. No definite fluid collection seen in the gallbladder fossa. Common bile duct: Diameter: 4.4 mm, normal. Liver: No focal lesion identified. Within normal limits in parenchymal echogenicity. Incidental note is made of a 2.5 x 2.2 x 2.9 cm anechoic simple cyst in the upper pole of the right kidney. Small right pleural effusion. IMPRESSION: 1. Status post cholecystectomy without definite fluid collection in the gallbladder fossa. 2. Small right pleural effusion. Electronically Signed   By: Titus Dubin M.D.   On: 11/26/2016 10:56    Scheduled Meds: . aspirin  81 mg Oral Daily  . docusate sodium  100 mg Oral BID  . gabapentin  100 mg Oral QHS  . gi cocktail  30 mL Oral Once  . insulin aspart   0-9 Units Subcutaneous TID WC  . pantoprazole  80 mg Oral Daily  . polyethylene glycol  17 g Oral Daily  . potassium chloride  20 mEq Oral Daily  . rosuvastatin  10 mg Oral Daily     Time spent: 25 min  Verlon Au [facility] Pager [phone]. If 7PM-7AM, please contact night-coverage at [email or web address], Office  [phone]  password [email or web address]  11/27/2016, 4:45 PM  LOS: 5 days

## 2016-11-28 LAB — GLUCOSE, CAPILLARY
Glucose-Capillary: 117 mg/dL — ABNORMAL HIGH (ref 65–99)
Glucose-Capillary: 148 mg/dL — ABNORMAL HIGH (ref 65–99)

## 2016-11-28 MED ORDER — FUROSEMIDE 40 MG PO TABS: 20.0000 mg | ORAL_TABLET | Freq: Two times a day (BID) | ORAL | 0 refills | Status: DC

## 2016-11-28 MED ORDER — OXYCODONE HCL 5 MG PO TABS: 5.0000 mg | ORAL_TABLET | ORAL | 0 refills | Status: DC | PRN

## 2016-11-28 MED ORDER — LEVOFLOXACIN 500 MG PO TABS: 500.0000 mg | ORAL_TABLET | Freq: Every day | ORAL | 0 refills | Status: AC

## 2016-11-28 MED ORDER — ACETAMINOPHEN 325 MG PO TABS: 650.0000 mg | ORAL_TABLET | Freq: Four times a day (QID) | ORAL | Status: DC | PRN

## 2016-11-28 MED ORDER — POLYETHYLENE GLYCOL 3350 17 G PO PACK: 17.0000 g | PACK | Freq: Every day | ORAL | 0 refills | Status: DC | PRN

## 2016-11-28 MED ORDER — POLYETHYLENE GLYCOL 3350 17 G PO PACK: 17.0000 g | PACK | Freq: Every day | ORAL | Status: DC | PRN

## 2016-11-28 NOTE — Care Management Note (Addendum)
Case Management Note  Patient Details  Name: Dillon Andrews MRN: [id number] Date of Birth: 31-Mar-1941  Subjective/Objective:  Presented for nausea/ epigastric pain- Ultrasound revealed cholelithiasis. S/p lap chole. Plan for d/c home with wife.                    Action/Plan: CM did speak with pt in regards to disposition needs. No needs identified at time of visit. Pt felt like since drain was removed- he will not need HHRN -Pt is declining [facility] at this time. Pt is aware to contact PCP for any home needs.   Expected Discharge Date:  11/28/16               Expected Discharge Plan:  Home/Self Care  In-House Referral:  NA  Discharge planning Services  CM Consult  Post Acute Care Choice:  NA Choice offered to:  NA  DME Arranged:  N/A DME Agency:  NA  HH Arranged:  Pt [place] Agency:  NA  Status of Service:  Completed, signed off  If discussed at [place] of Stay Meetings, dates discussed:  11-28-16  Additional Comments:  Bethena Roys, RN 11/28/2016, 11:52 AM

## 2016-11-28 NOTE — Discharge Summary (Signed)
Physician Discharge Summary  Dillon Andrews OTL:[id number] DOB: Feb 07, 1941 DOA: 11/19/2016  PCP: No primary care provider on file.  Admit date: 11/19/2016 Discharge date: 11/28/2016  Time spent: 30 minutes  Recommendations for Outpatient Follow-up:  1. Patient will need chest x-ray 4 weeks to denote clearing of pneumonia 2. Complete 7 days of Levaquin prescribed on discharge to cover both possible aspiration and intra-abdominal issues 3. Prescribed Percocet this admission for pain second choice-first choice Tylenol, also use MiraLAX for constipation in addition to Prilosec at home for GERD 4. Changed dose of Lasix on discharge from 40 mg to 20 mg twice a day 5. Postop care as well as limitations as per general surgery 6. Recommend repeat CBC 1 week given  anemia this admission--may benefit from outpatient iron supplementation after discharge  Discharge Diagnoses:  Principal Problem:   Epigastric pain Active Problems:   CKD (chronic kidney disease) stage 3, GFR 30-59 ml/min   HTN (hypertension)   DM2 (diabetes mellitus, type 2) (HCC)   Chronic systolic CHF (congestive heart failure) (HCC)   Pleural effusion on right   Elevated troponin   Esophageal varices in cirrhosis ([place])   Pneumonia   Cholecystitis   Discharge Condition: improved  Diet recommendation: heart healthy  Filed Weights   11/26/16 0443 11/27/16 0544 11/28/16 0510  Weight: 84.5 kg (186 lb 4.8 oz) 84 kg (185 lb 1.6 oz) 83.1 kg (183 lb 4.8 oz)    History of present illness:   76 y.o.malewith medical history significant of Systolic CHF with EF 97-41% appears to be NICM with only minimal non-occlusive CAD on cath in June of this year; Esophageal varices due to presumed non alcoholic cirrhosis of liver followed by GI Dr. Marin Comment; DM2, HTN, HLD, CKD stage 3 with baseline creat of 1.5; NSVT started on Amiodarone in June after heart cath, Amiodarone stopped during hospital stay this past week due to epigastric discomfort and  nausea.  Patient was just hospitalized at [facility]  with acute on chronic systolic CHF exacerbation.  His breathing improved significantly with diuresis.   During that hospital stay he was noted to have a RML consolidation and R pleural effusion. No fevers, no SIRS, completed course of ABx, pro calcitonin was negative. Ultimately this was felt to be due to asymmetric pulmonary edema..  Readmitted08/10/2016 epigastric pain and nausea  Patient was seen by GI, and GI recommended surgical consultation for symptomatic gallstones. General surgery saw the patient and patient underwent laparoscopic cholecystectomy. Patient was started on a full liquid diet and was tolerating it well. JP drain was placed and patient had serosanguineous discharge.  During hospital stay he desatted, and developed leukocytosis and was found to have probable pneumonia  Hospital Course:    Status post laparoscopic cholecystectomy 11/22/2016- patient presented with nausea/epigastricpain- he  had negative GI emptying study during previous hospital stay, HIDA scan also was negative. Abdominal ultrasound showed cholelithiasis,But no cholecystitis patient does have history of elevated liver enzymes . GI was consulted and did recommend general surgery consultation  \Patient is status post laparoscopic cholecystectomy  he is tolerating diet we will keep on IV fluids today and discontinue a.m.   Elevated troponin- patient had mild elevation of troponin, cardiac cath  done in [facility] in June showed minimal nonocclusive CAD. He denies chest pain. Cardiology was consulted, no plan for any aggressive intervention at this time.   Dyspnea-? Pneumonia vs PE- CT chest at the time of admission showed atelectasis versus pneumonia, minimal effusion. Status  post thoracentesis at [facility] hospital. Antibiotics were transitioned from vancomycin and Zosyn to Zosyn monotherapy 8/15 VQ scan is negative for pulmonary  embolus We will cover for the expected duration 7-10 days postop and he will need a repeat x-ray in about a month   3. ? Pneumoperitoneum- likely postoperative, discussed with Dr. Hulen Skains patient passing stool now no further workup.    4. Anemia- acute blood loss anemia, postoperative. Hemoglobin has dropped from 14  on 11/21/2016 to 8.8   5. Chronic systolic CHF- stable, euvolemic.  patient was  On  furosemide 40 mg by mouth twice a day, which is currently on hold due to hypotension.consider restarting Lasix once patient's blood pressure improves. He is also on gentle IV hydration. Monitor strict intake and output    6. History of NSVT- amiodarone was  stopped due to intolerance during last hospital stay at [facility] hospital.   7. Diabetes mellitus- continue sliding scale insulin.Blood glucose is 123, 135.   Resuming glipizide and other novel agent    8. Hyperlipidemia- continue Crestor.   9. Hypertension-blood pressure is stable, continue metoprolol   10. CKD stage III- creatinine is 1.53 today. Improved from 1.66 yesterday.      Procedures:  Laparoscopic cholecystectomy 8/10  VQ scan  Multiple x-rays   Consultations:  General surgery  Cardiology  Discharge Exam: Vitals:   11/28/16 0510 11/28/16 0747  BP: 117/73 124/74  Pulse: 71 74  Resp: (!) 30 (!) 21  Temp: 97.9 F (36.6 C) 98 F (36.7 C)  SpO2: 94% 96%     General: EOMI NCAT Cardiovascular: S1-S2 no murmur rub or gallop, looks well Respiratory: Clinically clear no added sound  Discharge Instructions    Current Discharge Medication List    CONTINUE these medications which have NOT CHANGED   Details  aspirin EC 81 MG tablet Take 81 mg by mouth daily.    furosemide (LASIX) 40 MG tablet Take 40 mg by mouth 2 (two) times daily.    gabapentin (NEURONTIN) 100 MG capsule Take 100 mg by mouth at bedtime.    glipiZIDE (GLUCOTROL) 5 MG tablet Take 2.5 mg by mouth 2 (two) times daily. Refills: 0     metoprolol succinate (TOPROL-XL) 25 MG 24 hr tablet Take 25 mg by mouth daily. HR<55/min or SBP <100 mm hg    nitroGLYCERIN (NITROSTAT) 0.4 MG SL tablet Place 0.4 mg under the tongue.    omeprazole (PRILOSEC) 40 MG capsule Take 40 mg by mouth daily.    potassium chloride SA (K-DUR,KLOR-CON) 20 MEQ tablet Take 20 mEq by mouth daily as needed. When taking lasix    repaglinide (PRANDIN) 0.5 MG tablet Take 0.5 mg by mouth 3 (three) times daily.    vitamin B-12 (CYANOCOBALAMIN) 500 MCG tablet Take 500 mcg by mouth every other day.    zolpidem (AMBIEN) 5 MG tablet Take 5 mg by mouth at bedtime as needed for sleep.       No Known Allergies Follow-up Information    Coralie Keens, MD. Go on 12/10/2016.   Specialty:  General Surgery Why:  at 2:20 PM for post-operative follow up. please arrive 30 minutes early.  Contact information: [address] [place] [address] [phone]            The results of significant diagnostics from this hospitalization (including imaging, microbiology, ancillary and laboratory) are listed below for reference.    Significant Diagnostic Studies: Dg Chest 2 View  Result  Date: 11/25/2016 CLINICAL DATA:  Shortness of Breath EXAM: CHEST  2 VIEW COMPARISON:  November 19, 2016 chest radiograph and chest CT November 19, 2016 FINDINGS: There are small pleural effusions bilaterally. There is persistent patchy infiltrate in the right base with new patchy consolidation in the medial left base. There is also bibasilar atelectatic change. The upper lung zones are clear. Heart is mildly enlarged with pulmonary vascularity within normal limits. There is aortic atherosclerosis. There is degenerative change in the thoracic spine. There is a focus of air at the right basal frontal lateral views which probably is above the diaphragm. It is difficult to entirely exclude a small focus of air below the diaphragm in this area given the position of air with respect  to the diaphragm. IMPRESSION: 1. Increase in patchy infiltrate in the left base with small left pleural effusion. There is a focal area of patchy infiltrate right base which is essentially stable. Small right pleural effusion. Bibasilar atelectasis. Mild cardiomegaly is noted.  There is aortic atherosclerosis. There is a focus of air at the right base, probably above the diaphragm. Given the possibility of pneumoperitoneum in this area, however, a left lateral decubitus abdomen image to assess for the possibility of pneumoperitoneum is felt to be advisable. These results will be called to the ordering clinician or representative by the Radiologist Assistant, and communication documented in the PACS or zVision Dashboard. Aortic Atherosclerosis (ICD10-I70.0). Electronically Signed   By: Lowella Grip III M.D.   On: 11/25/2016 09:52   Dg Chest 2 View  Result Date: 11/19/2016 CLINICAL DATA:  Chest pain and shortness of breath EXAM: CHEST  2 VIEW COMPARISON:  November 17, 2016 FINDINGS: There is airspace consolidation in the right middle lobe consistent with pneumonia. There is a minimal right pleural effusion. Lungs elsewhere clear. Heart is borderline enlarged with pulmonary vascularity within normal limits. No adenopathy. There is aortic atherosclerosis. There is degenerative change in the thoracic spine. IMPRESSION: Consolidation in the right middle lobe, consistent with pneumonia. Small right pleural effusion. Left lung clear. Heart prominent with pulmonary vascularity within normal limits. There is aortic atherosclerosis. Aortic Atherosclerosis (ICD10-I70.0). Electronically Signed   By: Lowella Grip III M.D.   On: 11/19/2016 14:57   Ct Chest Wo Contrast  Result Date: 11/20/2016 CLINICAL DATA:  Acute onset of generalized chest pain and shortness of breath. Initial encounter. EXAM: CT CHEST WITHOUT CONTRAST TECHNIQUE: Multidetector CT imaging of the chest was performed following the standard protocol  without IV contrast. COMPARISON:  Chest radiograph performed earlier today at 3:02 p.m., and CTA of the chest performed 10/03/2016 FINDINGS: Cardiovascular: The heart is borderline normal in size. Mild calcification is noted at the aortic arch. Scattered coronary artery calcifications are seen. The great vessels are grossly unremarkable in appearance. Mediastinum/Nodes: A mildly enlarged aortopulmonary window node is seen, measuring 1.2 cm in short axis. No pericardial effusion is identified. The thyroid gland is unremarkable. No axillary lymphadenopathy is seen. Lungs/Pleura: A small right pleural effusion is noted. Partial consolidation of the right lower lobe may reflect atelectasis or possibly mild infection. Mild left basilar atelectasis is noted. No pneumothorax is seen. No dominant mass is identified. Upper Abdomen: The visualized portions of the liver and spleen are unremarkable. Vague soft tissue inflammation is noted about the gallbladder, raising concern for cholecystitis. Stones are noted within the gallbladder. The visualized portions of the pancreas and adrenal glands are within normal limits. A right renal cyst is noted. Musculoskeletal: No acute osseous abnormalities are  identified. Anterior bridging osteophytes are seen along the thoracic spine. The visualized musculature is unremarkable in appearance. IMPRESSION: 1. Small right pleural effusions. Partial consolidation of the right lower lung lobe may reflect atelectasis or possibly pneumonia. Mild left basilar atelectasis noted. 2. Vague soft tissue inflammation at the bladder raises question for cholecystitis. Would correlate for any associated symptoms. Cholelithiasis noted. 3. Mildly enlarged aortopulmonary window node, measuring 1.2 cm in short axis. 4. Scattered coronary artery calcifications seen. 5. Right renal cyst noted. Electronically Signed   By: Garald Balding M.D.   On: 11/20/2016 02:59   Nm Pulmonary Perf And Vent  Result Date:  11/26/2016 CLINICAL DATA:  Shortness of Breath EXAM: NUCLEAR MEDICINE VENTILATION - PERFUSION LUNG SCAN TECHNIQUE: Ventilation images were obtained in multiple projections using inhaled aerosol Tc-81mDTPA. Perfusion images were obtained in multiple projections after intravenous injection of Tc-919mAA. RADIOPHARMACEUTICALS:  31.0 mCi Technetium-9936mPA aerosol inhalation and 4.2 mCi Technetium-64m60m IV COMPARISON:  None. FINDINGS: Ventilation: No focal ventilation defect. Perfusion: No wedge shaped peripheral perfusion defects to suggest acute pulmonary embolism. IMPRESSION: No evidence of pulmonary embolus. Electronically Signed   By: KeviRolm Baptise.   On: 11/26/2016 14:49   Dg Chest Port 1 View  Result Date: 11/25/2016 CLINICAL DATA:  Acute onset of difficulty breathing. Initial encounter. EXAM: PORTABLE CHEST 1 VIEW COMPARISON:  Chest radiograph performed earlier today at 9:50 a.m. FINDINGS: The lungs are well-aerated. Persistent right basilar and worsening retrocardiac airspace opacity raises concern for pneumonia. A small left pleural effusion is again noted. No pneumothorax is seen. The cardiomediastinal silhouette is borderline normal in size. No acute osseous abnormalities are identified. IMPRESSION: Persistent right basilar and worsening retrocardiac airspace opacity raises concern for worsening pneumonia. Small left pleural effusion. Electronically Signed   By: JeffGarald Balding.   On: 11/25/2016 22:07   Dg Abd Decub  Result Date: 11/25/2016 CLINICAL DATA:  Shortness of breath.  Recent cholecystectomy. EXAM: ABDOMEN - 1 VIEW DECUBITUS COMPARISON:  Chest radiographs November 25, 2016 FINDINGS: There is a small amount of pneumoperitoneum which may be of postoperative etiology given the history. There is no bowel dilatation or air-fluid levels suggesting bowel obstruction. There is a drain in the right upper quadrant. There is a fracture of the right ninth rib. There are old healed fractures of  the tenth and eleventh ribs on the right. IMPRESSION: Small amount of pneumoperitoneum which may well be of postoperative etiology given the history. Bowel gas pattern unremarkable. Recent appearing fracture of the anterolateral right ninth rib. Old healed fractures tenth and eleventh ribs on the right. Drain in right upper quadrant region with surgical clips in this area. Electronically Signed   By: WillLowella Grip M.D.   On: 11/25/2016 12:49   Us AK[place]omen Limited Ruq  Result Date: 11/26/2016 CLINICAL DATA:  Recent laparoscopic cholecystectomy for gangrenous cholecystitis. Evaluate for postoperative hematoma. EXAM: ULTRASOUND ABDOMEN LIMITED RIGHT UPPER QUADRANT COMPARISON:  Abdominal ultrasound dated November 19, 2016. FINDINGS: Gallbladder: Surgically absent. Surgical drain in place. No definite fluid collection seen in the gallbladder fossa. Common bile duct: Diameter: 4.4 mm, normal. Liver: No focal lesion identified. Within normal limits in parenchymal echogenicity. Incidental note is made of a 2.5 x 2.2 x 2.9 cm anechoic simple cyst in the upper pole of the right kidney. Small right pleural effusion. IMPRESSION: 1. Status post cholecystectomy without definite fluid collection in the gallbladder fossa. 2. Small right pleural effusion. Electronically Signed   By: WillTitus Dubin  M.D.   On: 11/26/2016 10:56   US Abdomen Limited Ruq  Result Date: 11/19/2016 CLINICAL DATA:  76 year old male with sharp epigastric pain yesterday which persisted for several hours. Known gallstones. EXAM: ULTRASOUND ABDOMEN LIMITED RIGHT UPPER QUADRANT COMPARISON:  [facility] ultrasound 11/10/2016. [facility] CT Abdomen and Pelvis 05/16/2005 FINDINGS: Gallbladder: Confluent echogenic material located dependent in the gallbladder fundus compatible with tumefactive sludge (image 13). No vascularity on power Doppler (image 22). Suspect small superimposed gallstones measuring 2-3 mm (image 19).  Gallbladder wall thickness remains normal. No pericholecystic fluid. No sonographic Murphy sign elicited. Common bile duct: Diameter: 3 mm , normal Liver: No focal lesion identified. Within normal limits in parenchymal echogenicity. Other findings: Visible right kidney is within normal limits. Right pleural effusion partially visible (image 56). IMPRESSION: 1. Dependent tumefactive sludge and small gallstones within the gallbladder. No evidence of acute cholecystitis or biliary obstruction. 2. Right pleural effusion. Electronically Signed   By: Genevie Ann M.D.   On: 11/19/2016 14:38    Microbiology: Recent Results (from the past 240 hour(s))  Surgical pcr screen     Status: None   Collection Time: 11/21/16  4:16 PM  Result Value Ref Range Status   MRSA, PCR NEGATIVE NEGATIVE Final   Staphylococcus aureus NEGATIVE NEGATIVE Final    Comment:        The Xpert SA Assay (FDA approved for NASAL specimens in patients over 26 years of age), is one component of a comprehensive surveillance program.  Test performance has been validated by [facility] for patients greater than or equal to 13 year old. It is not intended to diagnose infection nor to guide or monitor treatment.      Labs: Basic Metabolic Panel:  Recent Labs Lab 11/23/16 0556 11/24/16 0318 11/25/16 0332 11/26/16 0224 11/27/16 0500  NA 138 139 141 141 141  K 4.3 3.8 3.5 3.6 3.7  CL 98* 98* 99* 100* 101  CO2 29 35* 33* 35* 31  GLUCOSE 163* 135* 129* 132* 118*  BUN 29* 31* 27* 24* 19  CREATININE 1.82* 1.79* 1.66* 1.53* 1.50*  CALCIUM 8.5* 8.1* 8.2* 8.0* 8.1*   Liver Function Tests:  Recent Labs Lab 11/23/16 0556 11/24/16 0318 11/27/16 0500  AST 63* 47* 57*  ALT 47 44 51  ALKPHOS 157* 149* 149*  BILITOT 1.4* 1.1 1.5*  PROT 5.5* 5.0* 5.4*  ALBUMIN 2.5* 2.3* 2.4*   No results for input(s): LIPASE, AMYLASE in the last 168 hours. No results for input(s): AMMONIA in the last 168 hours. CBC:  Recent Labs Lab  11/24/16 0318 11/25/16 0332 11/25/16 1901 11/26/16 0224 11/27/16 0500  WBC 14.7* 9.8 9.1 7.8 7.4  HGB 9.6* 8.9* 8.7* 8.5* 8.8*  HCT 29.5* 27.3* 27.4* 26.6* 27.7*  MCV 95.5 95.5 96.8 96.7 97.2  PLT 241 243 254 243 235   Cardiac Enzymes: No results for input(s): CKTOTAL, CKMB, CKMBINDEX, TROPONINI in the last 168 hours. BNP: BNP (last 3 results)  Recent Labs  11/19/16 1326  BNP 439.5*    ProBNP (last 3 results) No results for input(s): PROBNP in the last 8760 hours.  CBG:  Recent Labs Lab 11/27/16 0750 11/27/16 1134 11/27/16 1627 11/27/16 2122 11/28/16 0727  GLUCAP 117* 169* 148* 164* 117*       Signed:  Nita Sells MD   [facility] 11/28/2016, 10:10 AM

## 2016-11-28 NOTE — Plan of Care (Signed)
Problem: Safety: Goal: Ability to remain free from injury will improve Outcome: Progressing Fall risk bundle in place per policy. No falls, skin breakdown or other injuries this shift.   Problem: Tissue Perfusion: Goal: Risk factors for ineffective tissue perfusion will decrease Outcome: Progressing Total output from JP this shift was 170 ml. Appearance was sanguineous. Dressing was changed once this shift and has remained clean, dry and intact.

## 2016-11-28 NOTE — Progress Notes (Signed)
[facility] Progress Note  6 Days Post-Op  Subjective: CC:  Standing up brushing his teeth. Denies abdominal pain. Reports mild bleeding around drain site. -  Objective: Vital signs in last 24 hours: Temp:  [97.6 F (36.4 C)-98.4 F (36.9 C)] 98 F (36.7 C) (08/16 0747) Pulse Rate:  [63-79] 74 (08/16 0747) Resp:  [16-30] 21 (08/16 0747) BP: (97-127)/(51-74) 124/74 (08/16 0747) SpO2:  [93 %-98 %] 96 % (08/16 0747) Weight:  [83.1 kg (183 lb 4.8 oz)] 83.1 kg (183 lb 4.8 oz) (08/16 0510) Last BM Date: 11/28/16  Intake/Output from previous day: 08/15 0701 - 08/16 0700 In: 1772.5 [P.O.:960; I.V.:712.5; IV Piggyback:100] Out: 1171 [Urine:900; Drains:270; Stool:1] Intake/Output this shift: Total I/O In: -  Out: 25 [Drains:25]  PE: Gen:  Alert, NAD, pleasant Card:  Regular rate and rhythm Pulm:  Normal effort Abd: Soft, mild TTP RUQ/RLQ around drain, non-distended, bowel sounds present in all 4 quadrants, no HSM, incisions C/D/I, JP drain with minimal sanguinous output - bulb is not to suction, nor is it pinned to pts down, it is hanging freely.              JP drain: 270 cc/24h Skin: warm and dry, no rashes  Psych: A&Ox3   Lab Results:   Recent Labs  11/26/16 0224 11/27/16 0500  WBC 7.8 7.4  HGB 8.5* 8.8*  HCT 26.6* 27.7*  PLT 243 235   BMET  Recent Labs  11/26/16 0224 11/27/16 0500  NA 141 141  K 3.6 3.7  CL 100* 101  CO2 35* 31  GLUCOSE 132* 118*  BUN 24* 19  CREATININE 1.53* 1.50*  CALCIUM 8.0* 8.1*   PT/INR No results for input(s): LABPROT, INR in the last 72 hours. CMP     Component Value Date/Time   NA 141 11/27/2016 0500   K 3.7 11/27/2016 0500   CL 101 11/27/2016 0500   CO2 31 11/27/2016 0500   GLUCOSE 118 (H) 11/27/2016 0500   BUN 19 11/27/2016 0500   CREATININE 1.50 (H) 11/27/2016 0500   CALCIUM 8.1 (L) 11/27/2016 0500   PROT 5.4 (L) 11/27/2016 0500   ALBUMIN 2.4 (L) 11/27/2016 0500   AST 57 (H) 11/27/2016 0500   ALT 51  11/27/2016 0500   ALKPHOS 149 (H) 11/27/2016 0500   BILITOT 1.5 (H) 11/27/2016 0500   GFRNONAA 43 (L) 11/27/2016 0500   GFRAA 50 (L) 11/27/2016 0500   Lipase     Component Value Date/Time   LIPASE 35 11/19/2016 1326       Studies/Results: Nm Pulmonary Perf And Vent  Result Date: 11/26/2016 CLINICAL DATA:  Shortness of Breath EXAM: NUCLEAR MEDICINE VENTILATION - PERFUSION LUNG SCAN TECHNIQUE: Ventilation images were obtained in multiple projections using inhaled aerosol Tc-51mDTPA. Perfusion images were obtained in multiple projections after intravenous injection of Tc-946mAA. RADIOPHARMACEUTICALS:  31.0 mCi Technetium-9944mPA aerosol inhalation and 4.2 mCi Technetium-15m42m IV COMPARISON:  None. FINDINGS: Ventilation: No focal ventilation defect. Perfusion: No wedge shaped peripheral perfusion defects to suggest acute pulmonary embolism. IMPRESSION: No evidence of pulmonary embolus. Electronically Signed   By: KeviRolm Baptise.   On: 11/26/2016 14:49   Us [place] Limited Ruq  Result Date: 11/26/2016 CLINICAL DATA:  Recent laparoscopic cholecystectomy for gangrenous cholecystitis. Evaluate for postoperative hematoma. EXAM: ULTRASOUND ABDOMEN LIMITED RIGHT UPPER QUADRANT COMPARISON:  Abdominal ultrasound dated November 19, 2016. FINDINGS: Gallbladder: Surgically absent. Surgical drain in place. No definite fluid collection seen in the gallbladder fossa. Common bile  duct: Diameter: 4.4 mm, normal. Liver: No focal lesion identified. Within normal limits in parenchymal echogenicity. Incidental note is made of a 2.5 x 2.2 x 2.9 cm anechoic simple cyst in the upper pole of the right kidney. Small right pleural effusion. IMPRESSION: 1. Status post cholecystectomy without definite fluid collection in the gallbladder fossa. 2. Small right pleural effusion. Electronically Signed   By: Titus Dubin M.D.   On: 11/26/2016 10:56    Anti-infectives: Anti-infectives    Start     Dose/Rate Route  Frequency Ordered Stop   11/25/16 2000  vancomycin (VANCOCIN) 1,250 mg in sodium chloride 0.9 % 250 mL IVPB  Status:  Discontinued     1,250 mg 166.7 mL/hr over 90 Minutes Intravenous Every 24 hours 11/25/16 1857 11/27/16 0918   11/25/16 2000  piperacillin-tazobactam (ZOSYN) IVPB 3.375 g     3.375 g 12.5 mL/hr over 240 Minutes Intravenous Every 8 hours 11/25/16 1857     11/25/16 1600  levofloxacin (LEVAQUIN) IVPB 500 mg  Status:  Discontinued     500 mg 100 mL/hr over 60 Minutes Intravenous Every 24 hours 11/25/16 1503 11/25/16 1517   11/25/16 1530  levofloxacin (LEVAQUIN) IVPB 750 mg  Status:  Discontinued     750 mg 100 mL/hr over 90 Minutes Intravenous Every 48 hours 11/25/16 1517 11/27/16 0918   11/21/16 1630  cefTRIAXone (ROCEPHIN) 2 g in dextrose 5 % 50 mL IVPB  Status:  Discontinued     2 g 100 mL/hr over 30 Minutes Intravenous Every 24 hours 11/21/16 1557 11/22/16 1532   11/19/16 1515  ceFEPIme (MAXIPIME) 1 g in dextrose 5 % 50 mL IVPB     1 g 100 mL/hr over 30 Minutes Intravenous  Once 11/19/16 1503 11/19/16 1609   11/19/16 1515  vancomycin (VANCOCIN) IVPB 1000 mg/200 mL premix     1,000 mg 200 mL/hr over 60 Minutes Intravenous  Once 11/19/16 1503 11/19/16 1752   11/19/16 1511  ceFEPIme (MAXIPIME) 1 g injection    Comments:  [name]   : cabinet override      11/19/16 1511 11/20/16 0314       Assessment/Plan Possible PNA - SOB; CXR w/ R basilar infiltrate, on IV abx; VQ negative for PE Normocytic anemia - IVF saline locked, hgb 8.8 from 8.5, improved Elevated troponin Chronic systolic CHF - CXR this AM pending  Hx NSVT DM HLD - crestor 10 mg HTN CKD II  Acute on chronic calculous cholecystitis  s/p Procedure(s): POD# 6 LAPAROSCOPIC CHOLECYSTECTOMY (N/A) 8/10 Dr. Nathaneil Canary blackman  - per operating surgeon, necrotic liver/cystic stump w/ high amound intra-operative bleeding; risk for bile leak - tolerating low fat diet - JP OP 270cc/24 h sanguinous, no  evidence of bile in drain; possible removal JP drain prior to hospital discharge - RUQ fullness - U/S negative for fluid collection/hematoma. - pain: tylenol PRN, Oxycodone 5 mg PRN  FEN: low fat diet  ID: Cefepime x 1 dose 8/7, Ancef x 1 dose 8/9, started on empiric Vanc/Zosyn/Levaquin 8/14 for HCAP VTE: SCD's  Dispo: stable for discharge from surgical perspective - JP drain may be removed prior to discharge.   LOS: 6 days    [facility] 11/28/2016, 8:43 AM Pager: [phone] Consults: [phone] Mon-Fri 7:00 am-4:30 pm Sat-Sun 7:00 am-11:30 am

## 2016-11-28 NOTE — Progress Notes (Signed)
JP drain discontinued.  Gauze placed.  Will continue to monitor patient.

## 2016-11-29 LAB — GLUCOSE, CAPILLARY: GLUCOSE-CAPILLARY: 138 mg/dL — AB (ref 65–99)

## 2017-03-05 ENCOUNTER — Encounter: Payer: Self-pay | Admitting: Family Medicine

## 2017-03-05 ENCOUNTER — Ambulatory Visit: Payer: Medicare HMO | Admitting: Family Medicine

## 2017-03-05 VITALS — BP 104/70 | HR 70 | Temp 97.5°F | Ht 71.0 in | Wt 200.4 lb

## 2017-03-05 DIAGNOSIS — F439 Reaction to severe stress, unspecified: Secondary | ICD-10-CM | POA: Insufficient documentation

## 2017-03-05 DIAGNOSIS — G4701 Insomnia due to medical condition: Secondary | ICD-10-CM

## 2017-03-05 DIAGNOSIS — R0602 Shortness of breath: Secondary | ICD-10-CM | POA: Diagnosis not present

## 2017-03-05 DIAGNOSIS — N183 Chronic kidney disease, stage 3 unspecified: Secondary | ICD-10-CM

## 2017-03-05 DIAGNOSIS — E1122 Type 2 diabetes mellitus with diabetic chronic kidney disease: Secondary | ICD-10-CM | POA: Diagnosis not present

## 2017-03-05 DIAGNOSIS — G2581 Restless legs syndrome: Secondary | ICD-10-CM

## 2017-03-05 MED ORDER — GLUCOSE BLOOD VI STRP: ORAL_STRIP | 12 refills | Status: DC

## 2017-03-05 MED ORDER — GABAPENTIN 800 MG PO TABS: 800.0000 mg | ORAL_TABLET | Freq: Every day | ORAL | 1 refills | Status: DC

## 2017-03-05 MED ORDER — CLONAZEPAM 1 MG PO TABS: 1.0000 mg | ORAL_TABLET | Freq: Every day | ORAL | 0 refills | Status: DC

## 2017-03-05 NOTE — Progress Notes (Signed)
Subjective:  Patient ID: Dillon Andrews, male    DOB: 07/11/1940  Age: 76 y.o. MRN: [id number]  CC: Establish Care   HPI Dillon Andrews presents for follow-up of the above and to establish care with me at my new location.  Currently he is been through a lot in the past several months.  He is status post pneumonia and cholecystectomy.  He is suffering from insomnia.  He has been having shortness of breath.  He is experiencing dyspnea on exertion.  He has no chest pain, cough, fever, or sputum production.  Her recent echocardiogram is ejection fraction is 40%.  A recent heart catheterization was negative for vascular disease of significant.  He does have a past medical history of smoking.  As indicated above he has been under a lot of stress.  Recent sleep study was negative for apnea but did show restless leg syndrome.  He is currently taking 600 mg of gabapentin.  Clonazepam helps him sleep but he is asking for an increase times.  He continues to take his Lasix along with his potassium in the morning.  He sleeps on one pillow.  He has not noticed increased swelling in his legs.  History Bodin has a past medical history of Barrett's esophagus, CHF (congestive heart failure) ([place]), Chicken pox, Diabetes ([place]), Diabetes mellitus without complication ([place]), Esophageal varices ([place]), High cholesterol, Hypertension, and Pneumonia.   He has a past surgical history that includes Knee surgery; Hand tendon surgery; Back surgery; Cataract extraction; Left Heart Cath (09/2016); and Cholecystectomy (N/A, 11/22/2016).   His family history is not on file.He reports that he has quit smoking. he has never used smokeless tobacco. He reports that he drinks alcohol. He reports that he does not use drugs.  Outpatient Medications Prior to Visit  Medication Sig Dispense Refill  . aspirin EC 81 MG tablet Take 81 mg by mouth daily.    Marland Kitchen glipiZIDE (GLUCOTROL) 5 MG tablet Take 2.5 mg by mouth 2 (two) times daily.  0    . omeprazole (PRILOSEC) 40 MG capsule Take 40 mg by mouth daily.    . potassium chloride SA (K-DUR,KLOR-CON) 20 MEQ tablet Take 20 mEq by mouth daily.    . rosuvastatin (CRESTOR) 10 MG tablet Take 10 mg by mouth daily.    . vitamin B-12 (CYANOCOBALAMIN) 500 MCG tablet Take 500 mcg by mouth every other day.    . clonazePAM (KLONOPIN) 0.5 MG tablet Take 0.5 mg by mouth daily.    Marland Kitchen gabapentin (NEURONTIN) 100 MG capsule Take 300 mg by mouth at bedtime.     . furosemide (LASIX) 40 MG tablet Take 0.5 tablets (20 mg total) by mouth 2 (two) times daily. 60 tablet 0  . acetaminophen (TYLENOL) 325 MG tablet Take 2 tablets (650 mg total) by mouth every 6 (six) hours as needed for mild pain, fever or headache.    . nitroGLYCERIN (NITROSTAT) 0.4 MG SL tablet Place 0.4 mg under the tongue.    Marland Kitchen oxyCODONE (OXY IR/ROXICODONE) 5 MG immediate release tablet Take 1 tablet (5 mg total) by mouth every 4 (four) hours as needed for moderate pain. 30 tablet 0  . polyethylene glycol (MIRALAX / GLYCOLAX) packet Take 17 g by mouth daily as needed for mild constipation. 14 each 0  . repaglinide (PRANDIN) 0.5 MG tablet Take 0.5 mg by mouth 3 (three) times daily.    Marland Kitchen zolpidem (AMBIEN) 5 MG tablet Take 5 mg by mouth at bedtime as needed for  sleep.     No facility-administered medications prior to visit.     ROS Review of Systems  Constitutional: Negative for appetite change, chills, diaphoresis, fatigue and fever.  HENT: Negative.   Eyes: Negative.   Respiratory: Positive for shortness of breath. Negative for apnea, cough, choking, chest tightness, wheezing and stridor.   Cardiovascular: Negative for chest pain, palpitations and leg swelling.  Gastrointestinal: Negative.  Negative for abdominal pain, nausea and vomiting.  Endocrine: Negative.   Genitourinary: Negative for decreased urine volume.  Musculoskeletal: Negative for arthralgias and myalgias.  Skin: Negative for color change, pallor, rash and wound.   Neurological: Negative for speech difficulty and light-headedness.  Hematological: Negative.   Psychiatric/Behavioral: Negative.     Objective:  BP 104/70   Pulse 70   Temp (!) 97.5 F (36.4 C) (Oral)   Ht 5' 11"  (1.803 m)   Wt 200 lb 6 oz (90.9 kg)   SpO2 99%   BMI 27.95 kg/m   Physical Exam  Constitutional: He is oriented to person, place, and time. He appears well-developed and well-nourished. No distress.  HENT:  Head: Normocephalic and atraumatic.  Right Ear: External ear normal.  Left Ear: External ear normal.  Mouth/Throat: Oropharynx is clear and moist. No oropharyngeal exudate.  Eyes: Conjunctivae are normal. Pupils are equal, round, and reactive to light. Right eye exhibits no discharge. Left eye exhibits no discharge. No scleral icterus.  Neck: Neck supple. No JVD present. No tracheal deviation present. No thyromegaly present.  Cardiovascular: Normal rate, regular rhythm and normal heart sounds.  1 plus edema noted bilaterally in le's.   Pulmonary/Chest: No accessory muscle usage or stridor. No respiratory distress. He has decreased breath sounds. He has no wheezes. He has no rhonchi. He has no rales.  Abdominal: Soft. Bowel sounds are normal.  Lymphadenopathy:    He has no cervical adenopathy.  Neurological: He is alert and oriented to person, place, and time.  Skin: Skin is warm and dry. He is not diaphoretic.  Psychiatric: He has a normal mood and affect.      Assessment & Plan:   Dillon Andrews was seen today for establish care.  Diagnoses and all orders for this visit:  Type 2 diabetes mellitus with stage 3 chronic kidney disease, without long-term current use of insulin (HCC) -     Discontinue: glucose blood test strip; Use as instructed -     glucose blood test strip; Use as instructed  SOB (shortness of breath) -     Ambulatory referral to Pulmonology  Insomnia due to medical condition -     clonazePAM (KLONOPIN) 1 MG tablet; Take 1 tablet (1 mg  total) by mouth at bedtime. Only as needed.  Stress  Restless leg syndrome -     gabapentin (NEURONTIN) 800 MG tablet; Take 1 tablet (800 mg total) by mouth at bedtime.  Do believe that his shortness of breath on exertion is probably pulmonary related.  Probably has a diagnosis of COPD.  He has been referred for for pulmonary pulmonology consult.  I also feel it is though he has been experiencing stress and anxiety due to the relatively recent onset of multiple medical comorbidities.  I increased his dose of Klonopin to 1 mg nightly as needed.  I had a frank discussion with him about not continuing this and indefinitely.  Hopefully the increased dose of gabapentin will help him sleep.  He will also try melatonin.  He will follow-up in 3 months or sooner as  needed. I have discontinued Dillon Heap. Andrews's gabapentin, nitroGLYCERIN, repaglinide, zolpidem, oxyCODONE, acetaminophen, polyethylene glycol, and clonazePAM. I am also having him start on clonazePAM and gabapentin. Additionally, I am having him maintain his aspirin EC, omeprazole, vitamin B-12, glipiZIDE, furosemide, potassium chloride SA, rosuvastatin, and glucose blood.  Meds ordered this encounter  Medications  . DISCONTD: clonazePAM (KLONOPIN) 0.5 MG tablet    Sig: Take 0.5 mg by mouth daily.  . potassium chloride SA (K-DUR,KLOR-CON) 20 MEQ tablet    Sig: Take 20 mEq by mouth daily.  . rosuvastatin (CRESTOR) 10 MG tablet    Sig: Take 10 mg by mouth daily.  . clonazePAM (KLONOPIN) 1 MG tablet    Sig: Take 1 tablet (1 mg total) by mouth at bedtime. Only as needed.    Dispense:  30 tablet    Refill:  0  . DISCONTD: glucose blood test strip    Sig: Use as instructed    Dispense:  100 each    Refill:  12  . gabapentin (NEURONTIN) 800 MG tablet    Sig: Take 1 tablet (800 mg total) by mouth at bedtime.    Dispense:  90 tablet    Refill:  1  . glucose blood test strip    Sig: Use as instructed    Dispense:  100 each    Refill:  12      Follow-up: Return in about 3 months (around 06/05/2017), or if symptoms worsen or fail to improve.  [name], MD

## 2017-03-17 ENCOUNTER — Encounter: Payer: Self-pay | Admitting: Internal Medicine

## 2017-03-17 ENCOUNTER — Ambulatory Visit: Payer: Medicare HMO | Admitting: Internal Medicine

## 2017-03-17 VITALS — BP 116/62 | HR 69 | Ht 71.0 in | Wt 199.6 lb

## 2017-03-17 DIAGNOSIS — Z8709 Personal history of other diseases of the respiratory system: Secondary | ICD-10-CM

## 2017-03-17 DIAGNOSIS — R0602 Shortness of breath: Secondary | ICD-10-CM

## 2017-03-17 NOTE — Patient Instructions (Signed)
ICD-10-CM   1. SOB (shortness of breath) R06.02   2. History of pleural effusion Z87.09     Sign release to get [place] June discharge records and July 2018 pft Sign release to get renal records from [place] Do CT chest wo contrast this week  Followup  - await my call after ct chest to decide next step

## 2017-03-17 NOTE — Progress Notes (Signed)
Subjective:    Patient ID: Dillon Andrews, male    DOB: 1940-10-29, 76 y.o.   MRN: [id number]  PCP [name], MD  HPI  IOV 03/17/2017  Chief Complaint  Patient presents with  . Advice Only    Pt referred by Dr. Ethelene Hal due to SOB. Pt states that he can become SOB even when doing nothing.   76 year old male accompanied by his daughter. Patient gives a good history. Daughter also supplements her history. Some of the history is obtained from review of the chart. He tells me that he is to work in [employer] He has been retired. Up until June 2018 he did not have any dyspnea. Then he said having some dyspnea associated with epigastric pain present on exertion. He had 3 admissions for total of 19 days at [facility]. Apparently during December 9 100 mL of amber-colored pleural effusion was drained. A diagnosis of chronic systolic heart failure with an ejection fraction of 45% was also made. However his symptoms persisted and then he got admitted in August 2018 at [facility]. A diagnosis of acute cholecystitis was made and he status post cholecystectomy and is epigastric pain resolved. Between the June and August admissions in July 2018 he did have pulmonary function test at [facility] he does not know the results. He did have a CT scan of the chest without contrast [his chronic kidney disease] that showed improvement in his pleural effusion but some pulmonary infiltrates in his right lung. He was advised follow-up he only had a chest x-ray in later in August 2018 that shows persistence of these findings. I personally visualized the film and agree with the findings. He continues to be shortness of breath with exertion of 100 feet relieved by rest. Does not much of a cough or orthopnea or wheezing. There is no weight loss and his appetite is good. He did get ruled out for pulmonary embolism through a VQ scan in August 2018. Labs show anemia of chronic  disease but is daughter says that primary care recheck the hemoglobin recently and it is improved. He has a nephrologist and [place] and apparently his creatinine has been checked couple of days ago results are pending. Walking desaturation test 185 feet 3 laps on room air: We walked him with a full head probe. He only walk one lap and stopped because of shortness of breath. Resting heart rate 71/m. Final heart rate at 1 lap was 90/m. Pulse ox stated at 90%. He basically stopped because of shortness of breath and also "his legs were giving out"  VQ scan aug 2018: No PE   IMPRESSION: CT chest wo contrast aug 2018 1. Small right pleural effusions. Partial consolidation of the right lower lung lobe may reflect atelectasis or possibly pneumonia. Mild left basilar atelectasis noted. 2. Vague soft tissue inflammation at the bladder raises question for cholecystitis. Would correlate for any associated symptoms. Cholelithiasis noted. 3. Mildly enlarged aortopulmonary window node, measuring 1.2 cm in short axis. 4. Scattered coronary artery calcifications seen. 5. Right renal cyst noted.   Electronically Signed   By: Garald Balding M.D.   On: 11/20/2016 02:59 Results for TRISTON, SKARE (MRN [id number]) as of 03/17/2017 16:22  Ref. Range 11/25/2016 19:01 11/25/2016 21:15 11/25/2016 22:01 11/26/2016 02:24 11/27/2016 05:00  Creatinine Latest Ref Range: 0.61 - 1.24 mg/dL    1.53 (H) 1.50 (H)  Results for BARUCH, LEWERS (MRN [id number]) as of 03/17/2017  16:22  Ref. Range 11/25/2016 19:01 11/25/2016 21:15 11/25/2016 22:01 11/26/2016 02:24 11/27/2016 05:00  Hemoglobin Latest Ref Range: 13.0 - 17.0 g/dL 8.7 (L)   8.5 (L) 8.8 (L)    has a past medical history of Barrett's esophagus, CHF (congestive heart failure) ([place]), Chicken pox, Diabetes ([place]), Diabetes mellitus without complication ([place]), Esophageal varices ([place]), High cholesterol, Hypertension, and Pneumonia.   reports that he quit smoking about 40  years ago. His smoking use included cigarettes. He quit after 15.00 years of use. he has never used smokeless tobacco.  Past Surgical History:  Procedure Laterality Date  . BACK SURGERY    . CATARACT EXTRACTION    . CHOLECYSTECTOMY N/A 11/22/2016   Procedure: LAPAROSCOPIC CHOLECYSTECTOMY;  Surgeon: Coralie Keens, MD;  Location: [facility];  Service: General;  Laterality: N/A;  . HAND TENDON SURGERY    . KNEE SURGERY    . LEFT HEART CATH  09/2016   Minimal, non-occlusive CAD, EF 45%    No Known Allergies  Immunization History  Administered Date(s) Administered  . Influenza Split 04/16/2011  . Influenza, High Dose Seasonal PF 01/18/2013, 01/15/2017  . Influenza,inj,Quad PF,6+ Mos 12/23/2014, 01/08/2016  . Influenza,inj,quad, With Preservative 01/12/2014  . Pneumococcal Polysaccharide-23 11/26/2014  . Zoster 01/12/2014    Family History  Problem Relation Age of Onset  . Cirrhosis Neg Hx      Current Outpatient Medications:  .  aspirin EC 81 MG tablet, Take 81 mg by mouth daily., Disp: , Rfl:  .  clonazePAM (KLONOPIN) 1 MG tablet, Take 1 tablet (1 mg total) by mouth at bedtime. Only as needed., Disp: 30 tablet, Rfl: 0 .  furosemide (LASIX) 40 MG tablet, Take 40 mg by mouth 2 (two) times daily., Disp: , Rfl:  .  gabapentin (NEURONTIN) 800 MG tablet, Take 1 tablet (800 mg total) by mouth at bedtime., Disp: 90 tablet, Rfl: 1 .  glipiZIDE (GLUCOTROL) 5 MG tablet, Take 2.5 mg by mouth 2 (two) times daily., Disp: , Rfl: 0 .  glucose blood test strip, Use as instructed, Disp: 100 each, Rfl: 12 .  omeprazole (PRILOSEC) 40 MG capsule, Take 40 mg by mouth daily., Disp: , Rfl:  .  potassium chloride SA (K-DUR,KLOR-CON) 20 MEQ tablet, Take 20 mEq by mouth daily., Disp: , Rfl:  .  rosuvastatin (CRESTOR) 10 MG tablet, Take 10 mg by mouth daily., Disp: , Rfl:  .  vitamin B-12 (CYANOCOBALAMIN) 500 MCG tablet, Take 500 mcg by mouth every other day., Disp: , Rfl:  .  furosemide (LASIX) 40 MG  tablet, Take 0.5 tablets (20 mg total) by mouth 2 (two) times daily., Disp: 60 tablet, Rfl: 0    Review of Systems  Constitutional: Negative for fever and unexpected weight change.  HENT: Positive for postnasal drip. Negative for congestion, dental problem, ear pain, nosebleeds, rhinorrhea, sinus pressure, sneezing, sore throat and trouble swallowing.   Eyes: Negative for redness and itching.  Respiratory: Positive for chest tightness and shortness of breath. Negative for cough and wheezing.   Cardiovascular: Negative for palpitations and leg swelling.  Gastrointestinal: Negative for nausea and vomiting.  Genitourinary: Negative for dysuria.  Musculoskeletal: Positive for joint swelling.  Skin: Negative for rash.  Allergic/Immunologic: Negative.  Negative for environmental allergies, food allergies and immunocompromised state.  Neurological: Negative for headaches.  Hematological: Bruises/bleeds easily.  Psychiatric/Behavioral: Negative for dysphoric mood. The patient is not nervous/anxious.        Objective:   Physical Exam  Constitutional: He is oriented to  person, place, and time. He appears well-developed and well-nourished. No distress.  Diminished at base  HENT:  Head: Normocephalic and atraumatic.  Right Ear: External ear normal.  Left Ear: External ear normal.  Mouth/Throat: Oropharynx is clear and moist. No oropharyngeal exudate.  Eyes: Conjunctivae and EOM are normal. Pupils are equal, round, and reactive to light. Right eye exhibits no discharge. Left eye exhibits no discharge. No scleral icterus.  Neck: Normal range of motion. Neck supple. No JVD present. No tracheal deviation present. No thyromegaly present.  Cardiovascular: Normal rate, regular rhythm and intact distal pulses. Exam reveals no gallop and no friction rub.  No murmur heard. Pulmonary/Chest: Effort normal and breath sounds normal. No respiratory distress. He has no wheezes. He has no rales. He exhibits no  tenderness.  Abdominal: Soft. Bowel sounds are normal. He exhibits no distension and no mass. There is no tenderness. There is no rebound and no guarding.  Musculoskeletal: Normal range of motion. He exhibits no edema or tenderness.  Lymphadenopathy:    He has no cervical adenopathy.  Neurological: He is alert and oriented to person, place, and time. He has normal reflexes. No cranial nerve deficit. Coordination normal.  Skin: Skin is warm and dry. No rash noted. He is not diaphoretic. No erythema. No pallor.  Psychiatric: He has a normal mood and affect. His behavior is normal. Judgment and thought content normal.  Nursing note and vitals reviewed.  Vitals:   03/17/17 1607  BP: 116/62  Pulse: 69  SpO2: 97%  Weight: 199 lb 9.6 oz (90.5 kg)  Height: 5' 11"  (1.803 m)    Estimated body mass index is 27.84 kg/m as calculated from the following:   Height as of this encounter: 5' 11"  (1.803 m).   Weight as of this encounter: 199 lb 9.6 oz (90.5 kg).          Assessment & Plan:     ICD-10-CM   1. SOB (shortness of breath) R06.02   2. History of pleural effusion Z87.09       Sign release to get [place] June discharge records and July 2018 pft Sign release to get renal records from [place] Do CT chest wo contrast this week  Followup  - await my call after ct chest to decide next step    Dr. Brand Males, M.D., [facility].C.P Pulmonary and Critical Care Medicine Staff Physician, [place] Director - Interstitial Lung Disease  Program  Pulmonary [place] at [place], [place], [address]  Pager: [phone], If no answer or between  15:00h - 7:00h: call [phone] Telephone: [phone]

## 2017-03-20 ENCOUNTER — Ambulatory Visit (INDEPENDENT_AMBULATORY_CARE_PROVIDER_SITE_OTHER)
Admission: RE | Admit: 2017-03-20 | Discharge: 2017-03-20 | Disposition: A | Discharge status: Home / Self Care | Payer: Medicare HMO | Source: Ambulatory Visit | Attending: Internal Medicine | Admitting: Internal Medicine

## 2017-03-20 DIAGNOSIS — R0602 Shortness of breath: Secondary | ICD-10-CM | POA: Diagnosis not present

## 2017-03-21 DIAGNOSIS — C4490 Unspecified malignant neoplasm of skin, unspecified: Secondary | ICD-10-CM | POA: Insufficient documentation

## 2017-03-23 ENCOUNTER — Telehealth: Payer: Self-pay | Admitting: Internal Medicine

## 2017-03-23 NOTE — Telephone Encounter (Signed)
Ct shows bialteral effusions with interstitial edema. This is all cardiac. Who is his cardiologist? He needs to follow with cardiology. Might need more lasix. Give him folllowup in 1-2 months  Dr. Brand Males, M.D., [facility].C.P Pulmonary and Critical Care Medicine Staff Physician, [place] Director - Interstitial Lung Disease  Program  Pulmonary [place] at [place], [place], [address]  Pager: [phone], If no answer or between  15:00h - 7:00h: call [phone] Telephone: [phone]

## 2017-03-26 NOTE — Telephone Encounter (Signed)
He can see anyon of our [facility] cardiologists here in [place] . If appt is tight try Dr Cephas Darby office either Dr Einar Gip or Dr Virgina Jock  Dr. Brand Males, M.D., [facility].C.P Pulmonary and Critical Care Medicine Staff Physician, [place] Director - Interstitial Lung Disease  Program  Pulmonary [place] at [place], [place], [address]  Pager: [phone], If no answer or between  15:00h - 7:00h: call [phone] Telephone: [phone]

## 2017-03-26 NOTE — Telephone Encounter (Signed)
Called pt's wife and advised message from the provider. Pt understood and verbalized understanding. Nothing further is needed.

## 2017-03-26 NOTE — Telephone Encounter (Signed)
Called spoke with patient's spouse Bonnita Nasuti (dpr on file) to discuss CT results/recommendations as stated by MR Bonnita Nasuti voiced her understanding Patient is established with [facility], ZAHID AZIZ in [facility]  Per [place], they are not wanting to stay with Dr Clydie Braun and would like to change providers to someone here in [place].  Did discuss with Bonnita Nasuti about patient seeing his cardiology again for follow up on these CT results but Bonnita Nasuti was hesitant about this.  Patient is currently taking 32m lasix BID.  HBonnita Nasutiwill speak with her daughter about the cardiologist name that she sees and would also like MR's recommendation on a cardiologist.  MR please advise, thank you.

## 2017-03-27 ENCOUNTER — Emergency Department (HOSPITAL_COMMUNITY): Payer: Medicare HMO

## 2017-03-27 ENCOUNTER — Telehealth: Payer: Self-pay | Admitting: Internal Medicine

## 2017-03-27 ENCOUNTER — Institutional Professional Consult (permissible substitution): Payer: Medicare HMO | Admitting: Pulmonary Disease

## 2017-03-27 ENCOUNTER — Encounter (HOSPITAL_COMMUNITY): Payer: Self-pay | Admitting: Emergency Medicine

## 2017-03-27 ENCOUNTER — Other Ambulatory Visit: Payer: Self-pay

## 2017-03-27 ENCOUNTER — Inpatient Hospital Stay (HOSPITAL_COMMUNITY)
Admission: EM | Admit: 2017-03-27 | Discharge: 2017-03-30 | DRG: 291 | Disposition: A | Discharge status: Home / Self Care | Payer: Medicare HMO | Attending: Internal Medicine | Admitting: Internal Medicine

## 2017-03-27 DIAGNOSIS — Z96652 Presence of left artificial knee joint: Secondary | ICD-10-CM | POA: Diagnosis present

## 2017-03-27 DIAGNOSIS — I5023 Acute on chronic systolic (congestive) heart failure: Secondary | ICD-10-CM | POA: Diagnosis not present

## 2017-03-27 DIAGNOSIS — J90 Pleural effusion, not elsewhere classified: Secondary | ICD-10-CM | POA: Diagnosis present

## 2017-03-27 DIAGNOSIS — Z961 Presence of intraocular lens: Secondary | ICD-10-CM | POA: Diagnosis present

## 2017-03-27 DIAGNOSIS — Z87891 Personal history of nicotine dependence: Secondary | ICD-10-CM | POA: Diagnosis not present

## 2017-03-27 DIAGNOSIS — Z79899 Other long term (current) drug therapy: Secondary | ICD-10-CM

## 2017-03-27 DIAGNOSIS — I10 Essential (primary) hypertension: Secondary | ICD-10-CM | POA: Diagnosis not present

## 2017-03-27 DIAGNOSIS — R0602 Shortness of breath: Secondary | ICD-10-CM | POA: Diagnosis not present

## 2017-03-27 DIAGNOSIS — Z85828 Personal history of other malignant neoplasm of skin: Secondary | ICD-10-CM

## 2017-03-27 DIAGNOSIS — K2270 Barrett's esophagus without dysplasia: Secondary | ICD-10-CM | POA: Diagnosis present

## 2017-03-27 DIAGNOSIS — N183 Chronic kidney disease, stage 3 unspecified: Secondary | ICD-10-CM

## 2017-03-27 DIAGNOSIS — I428 Other cardiomyopathies: Secondary | ICD-10-CM | POA: Diagnosis present

## 2017-03-27 DIAGNOSIS — I509 Heart failure, unspecified: Secondary | ICD-10-CM | POA: Insufficient documentation

## 2017-03-27 DIAGNOSIS — I493 Ventricular premature depolarization: Secondary | ICD-10-CM | POA: Diagnosis not present

## 2017-03-27 DIAGNOSIS — I340 Nonrheumatic mitral (valve) insufficiency: Secondary | ICD-10-CM | POA: Diagnosis present

## 2017-03-27 DIAGNOSIS — Z7984 Long term (current) use of oral hypoglycemic drugs: Secondary | ICD-10-CM

## 2017-03-27 DIAGNOSIS — R748 Abnormal levels of other serum enzymes: Secondary | ICD-10-CM | POA: Diagnosis not present

## 2017-03-27 DIAGNOSIS — E1122 Type 2 diabetes mellitus with diabetic chronic kidney disease: Secondary | ICD-10-CM | POA: Diagnosis not present

## 2017-03-27 DIAGNOSIS — I2510 Atherosclerotic heart disease of native coronary artery without angina pectoris: Secondary | ICD-10-CM | POA: Diagnosis present

## 2017-03-27 DIAGNOSIS — I248 Other forms of acute ischemic heart disease: Secondary | ICD-10-CM | POA: Diagnosis present

## 2017-03-27 DIAGNOSIS — Z9842 Cataract extraction status, left eye: Secondary | ICD-10-CM | POA: Diagnosis not present

## 2017-03-27 DIAGNOSIS — I5043 Acute on chronic combined systolic (congestive) and diastolic (congestive) heart failure: Secondary | ICD-10-CM | POA: Diagnosis present

## 2017-03-27 DIAGNOSIS — K219 Gastro-esophageal reflux disease without esophagitis: Secondary | ICD-10-CM | POA: Diagnosis present

## 2017-03-27 DIAGNOSIS — R778 Other specified abnormalities of plasma proteins: Secondary | ICD-10-CM | POA: Diagnosis present

## 2017-03-27 DIAGNOSIS — I361 Nonrheumatic tricuspid (valve) insufficiency: Secondary | ICD-10-CM | POA: Diagnosis not present

## 2017-03-27 DIAGNOSIS — Z9841 Cataract extraction status, right eye: Secondary | ICD-10-CM

## 2017-03-27 DIAGNOSIS — D631 Anemia in chronic kidney disease: Secondary | ICD-10-CM | POA: Diagnosis present

## 2017-03-27 DIAGNOSIS — E1140 Type 2 diabetes mellitus with diabetic neuropathy, unspecified: Secondary | ICD-10-CM | POA: Diagnosis present

## 2017-03-27 DIAGNOSIS — E7800 Pure hypercholesterolemia, unspecified: Secondary | ICD-10-CM | POA: Diagnosis present

## 2017-03-27 DIAGNOSIS — E876 Hypokalemia: Secondary | ICD-10-CM | POA: Diagnosis present

## 2017-03-27 DIAGNOSIS — I130 Hypertensive heart and chronic kidney disease with heart failure and stage 1 through stage 4 chronic kidney disease, or unspecified chronic kidney disease: Secondary | ICD-10-CM | POA: Diagnosis not present

## 2017-03-27 DIAGNOSIS — Z9111 Patient's noncompliance with dietary regimen: Secondary | ICD-10-CM

## 2017-03-27 DIAGNOSIS — Z7982 Long term (current) use of aspirin: Secondary | ICD-10-CM

## 2017-03-27 DIAGNOSIS — E119 Type 2 diabetes mellitus without complications: Secondary | ICD-10-CM

## 2017-03-27 DIAGNOSIS — R7989 Other specified abnormal findings of blood chemistry: Secondary | ICD-10-CM

## 2017-03-27 HISTORY — DX: Unspecified osteoarthritis, unspecified site: M19.90

## 2017-03-27 HISTORY — DX: Basal cell carcinoma of skin, unspecified: C44.91

## 2017-03-27 HISTORY — DX: Chronic kidney disease, stage 3 unspecified: N18.30

## 2017-03-27 HISTORY — DX: Gastro-esophageal reflux disease without esophagitis: K21.9

## 2017-03-27 HISTORY — DX: Chronic kidney disease, stage 3 (moderate): N18.3

## 2017-03-27 HISTORY — DX: Anemia, unspecified: D64.9

## 2017-03-27 HISTORY — DX: Type 2 diabetes mellitus without complications: E11.9

## 2017-03-27 LAB — TROPONIN I
Troponin I: 0.19 ng/mL (ref <0.03)
Troponin I: 0.2 ng/mL (ref <0.03)

## 2017-03-27 LAB — BASIC METABOLIC PANEL
Anion gap: 7 (ref 5–15)
BUN: 26 mg/dL — AB (ref 6–20)
CALCIUM: 8.9 mg/dL (ref 8.9–10.3)
CO2: 29 mmol/L (ref 22–32)
CREATININE: 1.72 mg/dL — AB (ref 0.61–1.24)
Chloride: 106 mmol/L (ref 101–111)
GFR calc Af Amer: 42 mL/min — ABNORMAL LOW (ref >60)
GFR, EST NON AFRICAN AMERICAN: 37 mL/min — AB (ref >60)
GLUCOSE: 118 mg/dL — AB (ref 65–99)
Potassium: 4.2 mmol/L (ref 3.5–5.1)
SODIUM: 142 mmol/L (ref 135–145)

## 2017-03-27 LAB — CBC
HCT: 38.8 % — ABNORMAL LOW (ref 39.0–52.0)
Hemoglobin: 11.7 g/dL — ABNORMAL LOW (ref 13.0–17.0)
MCH: 25.7 pg — ABNORMAL LOW (ref 26.0–34.0)
MCHC: 30.2 g/dL (ref 30.0–36.0)
MCV: 85.3 fL (ref 78.0–100.0)
PLATELETS: 217 10*3/uL (ref 150–400)
RBC: 4.55 MIL/uL (ref 4.22–5.81)
RDW: 19.9 % — AB (ref 11.5–15.5)
WBC: 7.5 10*3/uL (ref 4.0–10.5)

## 2017-03-27 LAB — CREATININE, SERUM
Creatinine, Ser: 1.68 mg/dL — ABNORMAL HIGH (ref 0.61–1.24)
GFR calc Af Amer: 44 mL/min — ABNORMAL LOW (ref >60)
GFR calc non Af Amer: 38 mL/min — ABNORMAL LOW (ref >60)

## 2017-03-27 LAB — GLUCOSE, CAPILLARY: Glucose-Capillary: 109 mg/dL — ABNORMAL HIGH (ref 65–99)

## 2017-03-27 LAB — I-STAT TROPONIN, ED: Troponin i, poc: 0.17 ng/mL (ref 0.00–0.08)

## 2017-03-27 LAB — CBG MONITORING, ED: Glucose-Capillary: 115 mg/dL — ABNORMAL HIGH (ref 65–99)

## 2017-03-27 LAB — BRAIN NATRIURETIC PEPTIDE: B Natriuretic Peptide: 489.5 pg/mL — ABNORMAL HIGH (ref 0.0–100.0)

## 2017-03-27 MED ORDER — CLONAZEPAM 0.5 MG PO TABS
0.5000 mg | ORAL_TABLET | Freq: Every evening | ORAL | Status: DC | PRN
  Administered 2017-03-28 – 2017-03-29 (×3): 0.5 mg via ORAL
  Filled 2017-03-27 (×4): qty 1

## 2017-03-27 MED ORDER — CLONAZEPAM 0.5 MG PO TABS: 1.0000 mg | ORAL_TABLET | Freq: Every day | ORAL | Status: DC

## 2017-03-27 MED ORDER — GABAPENTIN 400 MG PO CAPS
800.0000 mg | ORAL_CAPSULE | Freq: Every day | ORAL | Status: DC
  Administered 2017-03-27 – 2017-03-29 (×3): 800 mg via ORAL
  Filled 2017-03-27 (×3): qty 2

## 2017-03-27 MED ORDER — SODIUM CHLORIDE 0.9 % IV SOLN: 250.0000 mL | INTRAVENOUS | Status: DC | PRN

## 2017-03-27 MED ORDER — ONDANSETRON HCL 4 MG/2ML IJ SOLN: 4.0000 mg | Freq: Four times a day (QID) | INTRAMUSCULAR | Status: DC | PRN

## 2017-03-27 MED ORDER — INSULIN ASPART 100 UNIT/ML ~~LOC~~ SOLN: 0.0000 [IU] | Freq: Every day | SUBCUTANEOUS | Status: DC

## 2017-03-27 MED ORDER — FUROSEMIDE 10 MG/ML IJ SOLN
40.0000 mg | INTRAMUSCULAR | Status: AC
  Administered 2017-03-27: 40 mg via INTRAVENOUS
  Filled 2017-03-27: qty 4

## 2017-03-27 MED ORDER — CYANOCOBALAMIN 500 MCG PO TABS
500.0000 ug | ORAL_TABLET | ORAL | Status: DC
  Administered 2017-03-29: 500 ug via ORAL
  Filled 2017-03-27: qty 1

## 2017-03-27 MED ORDER — FUROSEMIDE 10 MG/ML IJ SOLN
40.0000 mg | Freq: Two times a day (BID) | INTRAMUSCULAR | Status: DC
  Administered 2017-03-27 – 2017-03-29 (×4): 40 mg via INTRAVENOUS
  Filled 2017-03-27 (×4): qty 4

## 2017-03-27 MED ORDER — POTASSIUM CHLORIDE CRYS ER 20 MEQ PO TBCR
20.0000 meq | EXTENDED_RELEASE_TABLET | Freq: Every day | ORAL | Status: DC
  Administered 2017-03-28 – 2017-03-30 (×3): 20 meq via ORAL
  Filled 2017-03-27 (×3): qty 1

## 2017-03-27 MED ORDER — ROSUVASTATIN CALCIUM 10 MG PO TABS
10.0000 mg | ORAL_TABLET | Freq: Every day | ORAL | Status: DC
  Administered 2017-03-27 – 2017-03-30 (×4): 10 mg via ORAL
  Filled 2017-03-27 (×5): qty 1

## 2017-03-27 MED ORDER — ASPIRIN EC 81 MG PO TBEC
81.0000 mg | DELAYED_RELEASE_TABLET | Freq: Every day | ORAL | Status: DC
  Administered 2017-03-28 – 2017-03-30 (×3): 81 mg via ORAL
  Filled 2017-03-27 (×3): qty 1

## 2017-03-27 MED ORDER — SODIUM CHLORIDE 0.9% FLUSH: 3.0000 mL | INTRAVENOUS | Status: DC | PRN

## 2017-03-27 MED ORDER — PANTOPRAZOLE SODIUM 40 MG PO TBEC
40.0000 mg | DELAYED_RELEASE_TABLET | Freq: Every day | ORAL | Status: DC
  Administered 2017-03-28 – 2017-03-30 (×3): 40 mg via ORAL
  Filled 2017-03-27 (×3): qty 1

## 2017-03-27 MED ORDER — SODIUM CHLORIDE 0.9% FLUSH
3.0000 mL | Freq: Two times a day (BID) | INTRAVENOUS | Status: DC
  Administered 2017-03-27 – 2017-03-30 (×7): 3 mL via INTRAVENOUS

## 2017-03-27 MED ORDER — INSULIN ASPART 100 UNIT/ML ~~LOC~~ SOLN
0.0000 [IU] | Freq: Three times a day (TID) | SUBCUTANEOUS | Status: DC
  Administered 2017-03-28: 2 [IU] via SUBCUTANEOUS

## 2017-03-27 MED ORDER — HEPARIN SODIUM (PORCINE) 5000 UNIT/ML IJ SOLN
5000.0000 [IU] | Freq: Three times a day (TID) | INTRAMUSCULAR | Status: DC
  Administered 2017-03-27 – 2017-03-30 (×7): 5000 [IU] via SUBCUTANEOUS
  Filled 2017-03-27 (×7): qty 1

## 2017-03-27 MED ORDER — ACETAMINOPHEN 325 MG PO TABS: 650.0000 mg | ORAL_TABLET | ORAL | Status: DC | PRN

## 2017-03-27 NOTE — Telephone Encounter (Signed)
Yes 100% to DR Glenetta Hew who is excellent

## 2017-03-27 NOTE — ED Triage Notes (Signed)
Pt states he has had intermittent CP/SOB since August. Pt has been diagnosed with CHF. Pt states he has been having swelling in abd and lower legs. Increased CP/SOB over the past few days. Endorses some nausea.

## 2017-03-27 NOTE — Telephone Encounter (Signed)
Pt states that he was told by our office to see a cardiologist.  Pt is requesting that we place a referral for him to see Dr. Ellyn Hack.  Pt's daughter sees him and the family thinks very highly of him.  MR ok to place referral?  Thanks.

## 2017-03-27 NOTE — Telephone Encounter (Signed)
Spoke with patient's wife who stated that the patient was currently admitted to [facility] for CHF. She stated that he has been having a hard time catching his breath with activity and felt tightness in his chest. She wanted to let MR know. She also wanted to proceed with the referral to Dr. Ellyn Hack.   Will route to MR so that he is aware of patient's status.

## 2017-03-27 NOTE — ED Provider Notes (Signed)
[facility] EMERGENCY DEPARTMENT Provider Note   CSN: [id number] Arrival date & time: 03/27/17  1208     History   Chief Complaint Chief Complaint  Patient presents with  . Chest Pain  . Shortness of Breath    HPI Dillon Andrews is a 76 y.o. male.  Dillon Andrews is a 76 y.o. Male resents to the emergency department complaining of increasing shortness of breath over the past several weeks.  He reports it is become worse today where he feels very short of breath if he bends over or tries to tie his shoes.  He reports he has been having some intermittent chest pain over the past several weeks that has not worsened or changed.  He denies any current chest pain.  He reports he is mainly short of breath.  He does not use oxygen at home.  He has been taking Lasix 20 mg twice a day.  He has been compliant with his medications.  He denies history of MI.  He tells me he had a cardiac catheterization in June of this year at [facility] that showed normal coronary arteries and a EF of 45%.  He reports increasing leg swelling.  He denies fevers, coughing, hemoptysis, abdominal pain, nausea, vomiting, diarrhea, rashes, urinary symptoms, or syncope.   The history is provided by the patient and medical records. No language interpreter was used.  Chest Pain   Associated symptoms include shortness of breath. Pertinent negatives include no abdominal pain, no back pain, no cough, no fever, no headaches, no nausea, no numbness, no palpitations and no vomiting.  Shortness of Breath  Associated symptoms include chest pain and leg swelling. Pertinent negatives include no fever, no headaches, no sore throat, no neck pain, no cough, no wheezing, no vomiting, no abdominal pain and no rash.    Past Medical History:  Diagnosis Date  . Barrett's esophagus   . CHF (congestive heart failure) ([place])   . Chicken pox   . Diabetes ([place])    type 2, no insulin required as outpt  as of 11/2015.    . Diabetes mellitus without complication ([place])   . Esophageal varices ([place])   . High cholesterol   . Hypertension   . Pneumonia     Patient Active Problem List   Diagnosis Date Noted  . Acute on chronic heart failure ([place]) 03/27/2017  . SOB (shortness of breath) 03/05/2017  . Insomnia 03/05/2017  . Stress 03/05/2017  . Restless leg syndrome 03/05/2017  . Cholecystitis 11/22/2016  . Pneumonia 11/21/2016  . CKD (chronic kidney disease) stage 3, GFR 30-59 ml/min (HCC) 11/19/2016  . HTN (hypertension) 11/19/2016  . DM2 (diabetes mellitus, type 2) ([place]) 11/19/2016  . Acute on chronic systolic heart failure ([place]) 11/19/2016  . Bilateral pleural effusion 11/19/2016  . Epigastric pain 11/19/2016  . Elevated troponin 11/19/2016  . Esophageal varices in cirrhosis ([place]) 11/19/2016    Past Surgical History:  Procedure Laterality Date  . BACK SURGERY    . CATARACT EXTRACTION    . CHOLECYSTECTOMY N/A 11/22/2016   Procedure: LAPAROSCOPIC CHOLECYSTECTOMY;  Surgeon: Coralie Keens, MD;  Location: [place];  Service: General;  Laterality: N/A;  . HAND TENDON SURGERY    . KNEE SURGERY    . LEFT HEART CATH  09/2016   Minimal, non-occlusive CAD, EF 45%       Home Medications    Prior to Admission medications   Medication Sig Start Date End Date Taking?  Authorizing Provider  aspirin EC 81 MG tablet Take 81 mg by mouth daily. 12/23/14   [provider]  clonazePAM (KLONOPIN) 1 MG tablet Take 1 tablet (1 mg total) by mouth at bedtime. Only as needed. 03/05/17   [name], MD  furosemide (LASIX) 40 MG tablet Take 0.5 tablets (20 mg total) by mouth 2 (two) times daily. 11/28/16 01/27/17  Nita Sells, MD  furosemide (LASIX) 40 MG tablet Take 40 mg by mouth 2 (two) times daily.    [provider]  gabapentin (NEURONTIN) 800 MG tablet Take 1 tablet (800 mg total) by mouth at bedtime. 03/05/17   [name], MD  glipiZIDE  (GLUCOTROL) 5 MG tablet Take 2.5 mg by mouth 2 (two) times daily. 09/21/16   [provider]  glucose blood test strip Use as instructed 03/05/17   [name], MD  omeprazole (PRILOSEC) 40 MG capsule Take 40 mg by mouth daily.    [provider]  potassium chloride SA (K-DUR,KLOR-CON) 20 MEQ tablet Take 20 mEq by mouth daily.    [provider]  rosuvastatin (CRESTOR) 10 MG tablet Take 10 mg by mouth daily.    [provider]  vitamin B-12 (CYANOCOBALAMIN) 500 MCG tablet Take 500 mcg by mouth every other day.    [provider]    Family History Family History  Problem Relation Age of Onset  . Cirrhosis Neg Hx     Social History Social History   Tobacco Use  . Smoking status: Former Smoker    Years: 15.00    Types: Cigarettes    Last attempt to quit: 03/17/1977    Years since quitting: 40.0  . Smokeless tobacco: Never Used  . Tobacco comment: smoked 3 cigs per day when smoked  Substance Use Topics  . Alcohol use: Yes    Comment: occ  . Drug use: No     Allergies   Patient has no known allergies.   Review of Systems Review of Systems  Constitutional: Negative for chills and fever.  HENT: Negative for congestion and sore throat.   Eyes: Negative for visual disturbance.  Respiratory: Positive for shortness of breath. Negative for cough and wheezing.   Cardiovascular: Positive for chest pain and leg swelling. Negative for palpitations.  Gastrointestinal: Negative for abdominal pain, diarrhea, nausea and vomiting.  Genitourinary: Negative for dysuria.  Musculoskeletal: Negative for back pain and neck pain.  Skin: Negative for rash.  Neurological: Negative for syncope, numbness and headaches.     Physical Exam Updated Vital Signs BP 117/75 (BP Location: Left Arm)   Pulse 69   Temp 98.7 F (37.1 C) (Oral)   Resp 16   Ht 5' 11"  (1.803 m)   Wt 91.2 kg (201 lb)   SpO2 98%   BMI 28.03 kg/m   Physical Exam    Constitutional: He is oriented to person, place, and time. He appears well-developed and well-nourished.  Non-toxic appearance. He does not appear ill. No distress.  HENT:  Head: Normocephalic and atraumatic.  Mouth/Throat: Oropharynx is clear and moist.  Eyes: Conjunctivae are normal. Pupils are equal, round, and reactive to light. Right eye exhibits no discharge. Left eye exhibits no discharge.  Neck: Neck supple.  Cardiovascular: Normal rate, regular rhythm, normal heart sounds and intact distal pulses. Exam reveals no gallop and no friction rub.  No murmur heard. Pulses:      Radial pulses are 2+ on the right side, and 2+ on the left side.  Dorsalis pedis pulses are 2+ on the right side, and 2+ on the left side.  Pulmonary/Chest: Effort normal. No respiratory distress. He has no wheezes. He has no rales.  Diminished lung sounds his bilateral bases which is worse on his left side.  Slight crackles bilaterally.  No increased work of breathing.  Oxygen saturation 98% on room air.  Symmetric chest expansion bilaterally.  Abdominal: Soft. There is no tenderness.  Musculoskeletal:       Right lower leg: He exhibits edema.       Left lower leg: He exhibits edema.  Lymphadenopathy:    He has no cervical adenopathy.  Neurological: He is alert and oriented to person, place, and time. Coordination normal.  Skin: Skin is warm and dry. Capillary refill takes less than 2 seconds. No rash noted. He is not diaphoretic. No erythema. No pallor.  Psychiatric: He has a normal mood and affect. His behavior is normal.  Nursing note and vitals reviewed.    ED Treatments / Results  Labs (all labs ordered are listed, but only abnormal results are displayed) Labs Reviewed  BASIC METABOLIC PANEL - Abnormal; Notable for the following components:      Result Value   Glucose, Bld 118 (*)    BUN 26 (*)    Creatinine, Ser 1.72 (*)    GFR calc non Af Amer 37 (*)    GFR calc Af Amer 42 (*)    All other  components within normal limits  CBC - Abnormal; Notable for the following components:   Hemoglobin 11.7 (*)    HCT 38.8 (*)    MCH 25.7 (*)    RDW 19.9 (*)    All other components within normal limits  I-STAT TROPONIN, ED - Abnormal; Notable for the following components:   Troponin i, poc 0.17 (*)    All other components within normal limits  BRAIN NATRIURETIC PEPTIDE  CREATININE, SERUM  TROPONIN I  TROPONIN I  TROPONIN I  HEMOGLOBIN A1C    EKG  EKG Interpretation  Date/Time:  Thursday March 27 2017 12:14:42 EST Ventricular Rate:  71 PR Interval:  196 QRS Duration: 160 QT Interval:  482 QTC Calculation: 523 R Axis:   -112 Text Interpretation:  Sinus rhythm with Premature atrial complexes with Abberant conduction Right bundle branch block Inferior infarct , age undetermined Anterolateral infarct , age undetermined Abnormal ECG Confirmed by Davonna Belling [phone]) on 03/27/2017 1:29:12 PM       Radiology Dg Chest 2 View  Result Date: 03/27/2017 CLINICAL DATA:  Midline lower chest tightness.  Shortness of breath. EXAM: CHEST  2 VIEW COMPARISON:  Chest CT 03/20/2017.  Chest radiograph 11/25/2016 FINDINGS: Cardiac silhouette remains upper limits of normal. There continues to be evidence for bilateral pleural effusions, left side greater than right. Upper lungs are clear without pulmonary edema. Atherosclerotic calcifications at the aortic arch. Trachea is midline. Bridging osteophytes throughout the thoracic spine. Degenerative changes in both shoulders and AC joints. IMPRESSION: Bilateral pleural effusions, left side greater than right. Left pleural effusion is small-moderate in size. Right pleural effusion is small in size. Minimal change from recent chest CT. Electronically Signed   By: Markus Daft M.D.   On: 03/27/2017 13:37    Procedures Procedures (including critical care time)  CRITICAL CARE Performed by: Hanley Hays   Total critical care time: 35  minutes  Critical care time was exclusive of separately billable procedures and treating other patients.  Critical care was necessary to treat or  prevent imminent or life-threatening deterioration.  Critical care was time spent personally by me on the following activities: development of treatment plan with patient and/or surrogate as well as nursing, discussions with consultants, evaluation of patient's response to treatment, examination of patient, obtaining history from patient or surrogate, ordering and performing treatments and interventions, ordering and review of laboratory studies, ordering and review of radiographic studies, pulse oximetry and re-evaluation of patient's condition.   Medications Ordered in ED Medications  furosemide (LASIX) injection 40 mg (not administered)  aspirin EC tablet 81 mg (not administered)  clonazePAM (KLONOPIN) tablet 1 mg (not administered)  gabapentin (NEURONTIN) tablet 800 mg (not administered)  pantoprazole (PROTONIX) EC tablet 40 mg (not administered)  potassium chloride SA (K-DUR,KLOR-CON) CR tablet 20 mEq (not administered)  rosuvastatin (CRESTOR) tablet 10 mg (not administered)  vitamin B-12 (CYANOCOBALAMIN) tablet 500 mcg (not administered)  sodium chloride flush (NS) 0.9 % injection 3 mL (not administered)  sodium chloride flush (NS) 0.9 % injection 3 mL (not administered)  0.9 %  sodium chloride infusion (not administered)  acetaminophen (TYLENOL) tablet 650 mg (not administered)  ondansetron (ZOFRAN) injection 4 mg (not administered)  heparin injection 5,000 Units (not administered)  furosemide (LASIX) injection 40 mg (not administered)  insulin aspart (novoLOG) injection 0-9 Units (not administered)  insulin aspart (novoLOG) injection 0-5 Units (not administered)     Initial Impression / Assessment and Plan / ED Course  I have reviewed the triage vital signs and the nursing notes.  Pertinent labs & imaging results that were available  during my care of the patient were reviewed by me and considered in my medical decision making (see chart for details).    This is a 76 y.o. Male resents to the emergency department complaining of increasing shortness of breath over the past several weeks.  He reports it is become worse today where he feels very short of breath if he bends over or tries to tie his shoes.  He reports he has been having some intermittent chest pain over the past several weeks that has not worsened or changed.  He denies any current chest pain.  He reports he is mainly short of breath.  He does not use oxygen at home.  He has been taking Lasix 20 mg twice a day.  He has been compliant with his medications.  He denies history of MI.  He tells me he had a cardiac catheterization in June of this year at [facility] that showed normal coronary arteries and a EF of 45%.  He reports increasing leg swelling. On exam the patient is afebrile nontoxic-appearing.  No tachypnea, tachycardia or hypoxia on exam.  His diminished lung sounds his bilateral bases without increased work of breathing.  Bilateral lower extremity edema.  EKG shows no evidence of STEMI. Chest x-ray shows bilateral pleural effusions.  Patient has chronic kidney disease and is around his baseline.  CBC is unremarkable. Troponin is elevated at 0.17.  Suspect this is troponin leak from CHF exacerbation.  Plan for admission and 40 of IV Lasix.  Plan to trend troponin. Patient and family agree with plan for admission.  I consulted with Dyanne Carrel, NP from [facility] hospitalist service who accepted the patient for admission.  This patient was discussed with Dr. Alvino Chapel who agrees with assessment and plan.    Final Clinical Impressions(s) / ED Diagnoses   Final diagnoses:  Acute on chronic systolic congestive heart failure (HCC)  Elevated troponin  Shortness of breath  CKD (chronic kidney disease) stage 3, GFR 30-59 ml/min [facility])    ED Discharge  Orders    None       Waynetta Pean, PA-C 03/27/17 1418    Davonna Belling, MD 03/27/17 [phone]

## 2017-03-27 NOTE — ED Notes (Signed)
MD Tegeler informed of pt positive troponin. Will move pt to next available room.

## 2017-03-27 NOTE — ED Notes (Signed)
C/o sob onset several weeks ago  States he had pneumonia in Aug. And states he just hasn't  Felt well since.States he had a CT scan last week and was told he had fluid in his lungs. C/o bilateral pedal edema. States if he walks more than 100 ft. He gets sob.

## 2017-03-27 NOTE — H&P (Signed)
History and Physical    Dillon Andrews ZPH:[id number] DOB: Jul 05, 1940 DOA: 03/27/2017  PCP: [name], MD Patient coming from: home  Chief Complaint: sob  HPI: Dillon Andrews is a delightful 76 y.o. male with medical history significant for diabetes, hypertension, chronic systolic heart failure, chronic kidney disease, pleural effusion, elevated troponin presents to emergency Department chief complaint of persistent worsening shortness of breath. Initial evaluation concerning for acute on chronic systolic heart failure. [facility] are asked to admit  Information is obtained from the patient and the chart and his daughter who is at the bedside. He states he was in the hospital in August of this year with pneumonia and quite frankly has "not been quite right since". He professes to have persistent shortness of breath that has worsened over the last several weeks. So she had symptoms include intermittent chest pain lower extremity edema. He states that bending over makes the shortness of breath worse and nothing makes it better. He is compliant with his medications that include Lasix. He weighs daily and reports a 1 pound weight gain over the last 3 days. He reports he had a cardiac catheterization in June of this year at [facility] which showed normal coronary arteries an EF of 45%. He denies headache dizziness syncope or near-syncope. He denies cough fever diarrhea constipation melena bright red blood per rectum. He denies dysuria hematuria frequency or urgency.  ED Course: In the emergency department he's afebrile hemodynamically stable and not hypoxic.  Review of Systems: As per HPI otherwise all other systems reviewed and are negative.   Ambulatory Status: Lives at home with his wife. He ambulates independently is independent with ADLs no recent falls  Past Medical History:  Diagnosis Date  . Barrett's esophagus   . CHF (congestive heart failure)  ([place])   . Chicken pox   . Diabetes ([place])    type 2, no insulin required as outpt as of 11/2015.    . Diabetes mellitus without complication ([place])   . Esophageal varices ([place])   . High cholesterol   . Hypertension   . Pneumonia     Past Surgical History:  Procedure Laterality Date  . BACK SURGERY    . CATARACT EXTRACTION    . CHOLECYSTECTOMY N/A 11/22/2016   Procedure: LAPAROSCOPIC CHOLECYSTECTOMY;  Surgeon: Coralie Keens, MD;  Location: [place];  Service: General;  Laterality: N/A;  . HAND TENDON SURGERY    . KNEE SURGERY    . LEFT HEART CATH  09/2016   Minimal, non-occlusive CAD, EF 45%    Social History   Socioeconomic History  . Marital status: Married    Spouse name: Not on file  . Number of children: Not on file  . Years of education: Not on file  . Highest education level: Not on file  Social Needs  . Financial resource strain: Not on file  . Food insecurity - worry: Not on file  . Food insecurity - inability: Not on file  . Transportation needs - medical: Not on file  . Transportation needs - non-medical: Not on file  Occupational History  . Not on file  Tobacco Use  . Smoking status: Former Smoker    Years: 15.00    Types: Cigarettes    Last attempt to quit: 03/17/1977    Years since quitting: 40.0  . Smokeless tobacco: Never Used  . Tobacco comment: smoked 3 cigs per day when smoked  Substance and Sexual Activity  . Alcohol  use: Yes    Comment: occ  . Drug use: No  . Sexual activity: Not on file  Other Topics Concern  . Not on file  Social History Narrative  . Not on file    No Known Allergies  Family History  Problem Relation Age of Onset  . Cirrhosis Neg Hx     Prior to Admission medications   Medication Sig Start Date End Date Taking? Authorizing Provider  aspirin EC 81 MG tablet Take 81 mg by mouth daily. 12/23/14   [provider]  clonazePAM (KLONOPIN) 1 MG tablet Take 1 tablet (1 mg total) by mouth at bedtime. Only as needed.  03/05/17   [name], MD  furosemide (LASIX) 40 MG tablet Take 0.5 tablets (20 mg total) by mouth 2 (two) times daily. 11/28/16 01/27/17  Nita Sells, MD  furosemide (LASIX) 40 MG tablet Take 40 mg by mouth 2 (two) times daily.    [provider]  gabapentin (NEURONTIN) 800 MG tablet Take 1 tablet (800 mg total) by mouth at bedtime. 03/05/17   [name], MD  glipiZIDE (GLUCOTROL) 5 MG tablet Take 2.5 mg by mouth 2 (two) times daily. 09/21/16   [provider]  glucose blood test strip Use as instructed 03/05/17   [name], MD  omeprazole (PRILOSEC) 40 MG capsule Take 40 mg by mouth daily.    [provider]  potassium chloride SA (K-DUR,KLOR-CON) 20 MEQ tablet Take 20 mEq by mouth daily.    [provider]  rosuvastatin (CRESTOR) 10 MG tablet Take 10 mg by mouth daily.    [provider]  vitamin B-12 (CYANOCOBALAMIN) 500 MCG tablet Take 500 mcg by mouth every other day.    [provider]    Physical Exam: Vitals:   03/27/17 1213 03/27/17 1215 03/27/17 1400  BP:  117/75 125/83  Pulse:  69 64  Resp:  16 (!) 25  Temp:  98.7 F (37.1 C)   TempSrc:  Oral   SpO2:  98% 97%  Weight: 91.2 kg (201 lb)    Height: 5' 11"  (1.803 m)       General:  Appears calm and comfortable in no acute distress Eyes:  PERRL, EOMI, normal lids, iris ENT:  grossly normal hearing, lips & tongue, mucous membranes of his mouth are moist and pink Neck:  no LAD, masses or thyromegaly Cardiovascular:  RRR, no m/r/g. 1+ LE edema.  Respiratory:  Normal effort at rest and with conversation. Breath sounds are quite diminished bilateral bases otherwise clear  Abdomen:  soft, ntnd, positive bowel sounds throughout no guarding or rebounding Skin:  no rash or induration seen on limited exam Musculoskeletal:  grossly normal tone BUE/BLE, good ROM, no bony abnormality Psychiatric:  grossly normal mood and affect, speech  fluent and appropriate, AOx3 Neurologic:  CN 2-12 grossly intact, moves all extremities in coordinated fashion, sensation intact  Labs on Admission: I have personally reviewed following labs and imaging studies  CBC: Recent Labs  Lab 03/27/17 1215  WBC 7.5  HGB 11.7*  HCT 38.8*  MCV 85.3  PLT 500   Basic Metabolic Panel: Recent Labs  Lab 03/27/17 1215  NA 142  K 4.2  CL 106  CO2 29  GLUCOSE 118*  BUN 26*  CREATININE 1.72*  CALCIUM 8.9   GFR: Estimated Creatinine Clearance: 41.6 mL/min (A) (by C-G formula based on SCr of 1.72 mg/dL (H)). Liver Function Tests: No results for input(s): AST, ALT, ALKPHOS, BILITOT,  PROT, ALBUMIN in the last 168 hours. No results for input(s): LIPASE, AMYLASE in the last 168 hours. No results for input(s): AMMONIA in the last 168 hours. Coagulation Profile: No results for input(s): INR, PROTIME in the last 168 hours. Cardiac Enzymes: No results for input(s): CKTOTAL, CKMB, CKMBINDEX, TROPONINI in the last 168 hours. BNP (last 3 results) No results for input(s): PROBNP in the last 8760 hours. HbA1C: No results for input(s): HGBA1C in the last 72 hours. CBG: No results for input(s): GLUCAP in the last 168 hours. Lipid Profile: No results for input(s): CHOL, HDL, LDLCALC, TRIG, CHOLHDL, LDLDIRECT in the last 72 hours. Thyroid Function Tests: No results for input(s): TSH, T4TOTAL, FREET4, T3FREE, THYROIDAB in the last 72 hours. Anemia Panel: No results for input(s): VITAMINB12, FOLATE, FERRITIN, TIBC, IRON, RETICCTPCT in the last 72 hours. Urine analysis:    Component Value Date/Time   COLORURINE YELLOW 11/19/2016 1326   APPEARANCEUR CLEAR 11/19/2016 1326   LABSPEC 1.006 11/19/2016 1326   PHURINE 7.5 11/19/2016 1326   GLUCOSEU NEGATIVE 11/19/2016 1326   HGBUR NEGATIVE 11/19/2016 1326   BILIRUBINUR NEGATIVE 11/19/2016 1326   KETONESUR NEGATIVE 11/19/2016 1326   PROTEINUR NEGATIVE 11/19/2016 1326   UROBILINOGEN 1.0 06/06/2008 0949    NITRITE NEGATIVE 11/19/2016 1326   LEUKOCYTESUR NEGATIVE 11/19/2016 1326    Creatinine Clearance: Estimated Creatinine Clearance: 41.6 mL/min (A) (by C-G formula based on SCr of 1.72 mg/dL (H)).  Sepsis Labs: @LABRCNTIP (procalcitonin:4,lacticidven:4) )No results found for this or any previous visit (from the past 240 hour(s)).   Radiological Exams on Admission: Dg Chest 2 View  Result Date: 03/27/2017 CLINICAL DATA:  Midline lower chest tightness.  Shortness of breath. EXAM: CHEST  2 VIEW COMPARISON:  Chest CT 03/20/2017.  Chest radiograph 11/25/2016 FINDINGS: Cardiac silhouette remains upper limits of normal. There continues to be evidence for bilateral pleural effusions, left side greater than right. Upper lungs are clear without pulmonary edema. Atherosclerotic calcifications at the aortic arch. Trachea is midline. Bridging osteophytes throughout the thoracic spine. Degenerative changes in both shoulders and AC joints. IMPRESSION: Bilateral pleural effusions, left side greater than right. Left pleural effusion is small-moderate in size. Right pleural effusion is small in size. Minimal change from recent chest CT. Electronically Signed   By: Markus Daft M.D.   On: 03/27/2017 13:37    EKG: Sinus rhythm with Premature atrial complexes with Abberant conduction Right bundle branch block Inferior infarct , age undetermined Anterolateral infarct ,   Assessment/Plan Principal Problem:   Acute on chronic systolic heart failure (HCC) Active Problems:   CKD (chronic kidney disease) stage 3, GFR 30-59 ml/min (HCC)   HTN (hypertension)   DM2 (diabetes mellitus, type 2) (HCC)   Bilateral pleural effusion   Elevated troponin   #1. Acute on chronic systolic heart failure. Recent CT consistent with CHF. EKG as noted above.  Initial troponin 0.17 which is down from august, Chest x-ray today reveals bilateral pleural effusions left side greater than right. BNP within the limits of normal. He is afebrile  no leukocytosis hemodynamically stable -Admit to telemetry -Cycle troponin -IV Lasix 40 mg twice a day -Monitor urine output -Obtain daily weights -Echocardiogram  #2. Elevated troponin. Patient with a history of same. Chart review indicates troponin 0.31 in August of this year. Today 0.17. EKG as noted above. -Cycle troponin -Serial EKG  #3. Bilateral pleural effusion. Recent CT reveals bilateral pleural effusion. Patient with a history of right pleural effusion. Little progression. -Lasix as noted above -Echocardiogram as  noted above -If no improvement consider repeat chest x-ray  #4. Chronic kidney disease stage III. Creatinine 1.7 on admission. This seems to be only slightly above baseline. -We will continue IV Lasix as noted above -Monitor BMET -hold any other nephrotoxins -monitor urine output  #5. Diabetes. Serum glucose 118 on admission. Patient on oral agents only. He reports he monitors his blood sugar 2-3 times a week. -We'll hold oral agents for now as his appetite is somewhat unreliable -We'll obtain hemoglobin A1c -Will use sliding scale insulin for optimal control -Carb modified diet  #6. Hypertension. Controlled in the emergency department. Only antihypertensive med on patient's home medication list is Lasix -Lasix as noted above -Monitor blood pressure   DVT prophylaxis: lovenox  Code Status: full  Family Communication: daughter at bedside  Disposition Plan: home hopefully 36 hours  Consults called: none  Admission status: inpatient    Radene Gunning MD [facility]  If 7PM-7AM, please contact night-coverage [email or web address] Password [email or web address]  03/27/2017, 2:32 PM

## 2017-03-28 ENCOUNTER — Inpatient Hospital Stay (HOSPITAL_COMMUNITY): Payer: Medicare HMO

## 2017-03-28 DIAGNOSIS — I361 Nonrheumatic tricuspid (valve) insufficiency: Secondary | ICD-10-CM

## 2017-03-28 DIAGNOSIS — E1122 Type 2 diabetes mellitus with diabetic chronic kidney disease: Secondary | ICD-10-CM

## 2017-03-28 DIAGNOSIS — J90 Pleural effusion, not elsewhere classified: Secondary | ICD-10-CM

## 2017-03-28 DIAGNOSIS — R748 Abnormal levels of other serum enzymes: Secondary | ICD-10-CM

## 2017-03-28 DIAGNOSIS — I10 Essential (primary) hypertension: Secondary | ICD-10-CM

## 2017-03-28 DIAGNOSIS — N183 Chronic kidney disease, stage 3 (moderate): Secondary | ICD-10-CM

## 2017-03-28 DIAGNOSIS — I5023 Acute on chronic systolic (congestive) heart failure: Secondary | ICD-10-CM

## 2017-03-28 LAB — BASIC METABOLIC PANEL
Anion gap: 10 (ref 5–15)
BUN: 24 mg/dL — AB (ref 6–20)
CHLORIDE: 104 mmol/L (ref 101–111)
CO2: 29 mmol/L (ref 22–32)
Calcium: 8.8 mg/dL — ABNORMAL LOW (ref 8.9–10.3)
Creatinine, Ser: 1.64 mg/dL — ABNORMAL HIGH (ref 0.61–1.24)
GFR calc Af Amer: 45 mL/min — ABNORMAL LOW (ref >60)
GFR calc non Af Amer: 39 mL/min — ABNORMAL LOW (ref >60)
GLUCOSE: 100 mg/dL — AB (ref 65–99)
POTASSIUM: 3.4 mmol/L — AB (ref 3.5–5.1)
Sodium: 143 mmol/L (ref 135–145)

## 2017-03-28 LAB — GLUCOSE, CAPILLARY
GLUCOSE-CAPILLARY: 97 mg/dL (ref 65–99)
GLUCOSE-CAPILLARY: 117 mg/dL — AB (ref 65–99)
Glucose-Capillary: 129 mg/dL — ABNORMAL HIGH (ref 65–99)
Glucose-Capillary: 152 mg/dL — ABNORMAL HIGH (ref 65–99)

## 2017-03-28 LAB — MAGNESIUM: MAGNESIUM: 1.6 mg/dL — AB (ref 1.7–2.4)

## 2017-03-28 LAB — TROPONIN I: TROPONIN I: 0.22 ng/mL — AB (ref <0.03)

## 2017-03-28 LAB — ECHOCARDIOGRAM COMPLETE
Height: 71 in
Weight: 3075.2 oz

## 2017-03-28 MED ORDER — POTASSIUM CHLORIDE CRYS ER 20 MEQ PO TBCR
40.0000 meq | EXTENDED_RELEASE_TABLET | Freq: Once | ORAL | Status: AC
  Administered 2017-03-28: 40 meq via ORAL
  Filled 2017-03-28: qty 2

## 2017-03-28 MED ORDER — CARVEDILOL 3.125 MG PO TABS
3.1250 mg | ORAL_TABLET | Freq: Two times a day (BID) | ORAL | Status: DC
  Administered 2017-03-28 – 2017-03-29 (×3): 3.125 mg via ORAL
  Filled 2017-03-28 (×4): qty 1

## 2017-03-28 NOTE — Consult Note (Signed)
Cardiology Consultation:   Patient ID: Dillon Andrews; [id number]; [birth year]   Admit date: 03/27/2017 Date of Consult: 03/28/2017  Primary Care Provider: [name], MD Primary Cardiologist: Milana Huntsman MD [facility]. Patient like to followed by [facility]. Will be followed by Dr. Martinique who seen the patient 11/2016.   Patient Profile:   Dillon Andrews is a 76 y.o. male with a hx of chronic systolic heart failure, nonischemic cardiomyopathy, NASH with esophageal varices, hypertension, diabetes type 2, PVCs,  hyperlipidemia and chronic kidney disease stage III who is being seen today for the evaluation of acute CHF at the request of Dr. Cruzita Lederer.   He also has a history of NSVT and was on amiodarone until this was stopped due to intolerance. He was hospitalized in June 2018 at [facility] with acute CHF. Underwent cardiac cath at that time showing normal coronary arteries (depatined report below). EF found to be reduced at 40-45%. He was treated with IV diuresis and has thoracentesis of approx. 900 cc on right. Reports he was in hospital for 8 days.   Seen by Dr. Martinique 11/2016 when he was admitted for epigastric pain.   History of Present Illness:   Mr. Skirvin presented with 41-month history of progressive worsening shortness of breath, dyspnea on exertion and orthopnea.  He is not compliant with low-sodium diet.  He has gained approximately 20 pounds of weight.  The patient is admitted for acute on chronic systolic heart failure.  He diuresed approximately 3 L with 9 pound weight loss on IV Lasix 40 mg twice daily.  Cardiology is consulted for further management as he likes to change primary provider.  His breathing has been improving.  Echocardiogram today showed LV function of 412-45% grade 2 diastolic dysfunction, mild mitral regurgitation mild dilated right ventricle with normal thickness, moderate TR and mildly elevated pulmonary pressure.  Left pleural effusion.  Past  Medical History:  Diagnosis Date  . Anemia 11/2016  . Arthritis    "some; hands/fingers" (03/27/2017)  . Barrett's esophagus   . Basal cell carcinoma    "face, arms, hands" (03/27/2017)  . CHF ([place]estive heart failure) ([place]   . Chicken pox   . CKD (chronic [place] disease), stage III ([place]   . Esophageal varices ([place]   . GERD (gastroesophageal reflux disease)   . High cholesterol   . Hypertension   . Pneumonia 09/2016  . Type II diabetes mellitus ([place]     Past Surgical History:  Procedure Laterality Date  . BACK SURGERY    . BASAL CELL CARCINOMA EXCISION Right    temple; "had to do skin graft"  . CARDIAC CATHETERIZATION  09/2016   Minimal, non-occlusive CAD, EF 45%  . CARPAL TUNNEL RELEASE Bilateral   . CATARACT EXTRACTION W/ INTRAOCULAR LENS  IMPLANT, BILATERAL Bilateral   . CHOLECYSTECTOMY N/A 11/22/2016   Procedure: LAPAROSCOPIC CHOLECYSTECTOMY;  Surgeon: BCoralie Keens MD;  [place]  Service: General;  Laterality: N/A;  . JOINT REPLACEMENT    . LUMBAR DISC SURGERY  X 2  . TONSILLECTOMY    . TOTAL KNEE ARTHROPLASTY Left 2010    Inpatient Medications: Scheduled Meds: . aspirin EC  81 mg Oral Daily  . cyanocobalamin  500 mcg Oral QODAY  . furosemide  40 mg Intravenous BID  . gabapentin  800 mg Oral QHS  . heparin  5,000 Units Subcutaneous Q8H  . insulin aspart  0-5 Units Subcutaneous QHS  . insulin aspart  0-9 Units Subcutaneous TID WC  .  pantoprazole  40 mg Oral Daily  . potassium chloride SA  20 mEq Oral Daily  . rosuvastatin  10 mg Oral Daily  . sodium chloride flush  3 mL Intravenous Q12H   Continuous Infusions: . sodium chloride     PRN Meds: sodium chloride, acetaminophen, clonazePAM, ondansetron (ZOFRAN) IV, sodium chloride flush  Allergies:   No Known Allergies  Social History:   Social History   Socioeconomic History  . Marital status: Married    Spouse name: Not on file  . Number of children: Not on file  . Years of education:  Not on file  . Highest education level: Not on file  Social Needs  . Financial resource strain: Not on file  . Food insecurity - worry: Not on file  . Food insecurity - inability: Not on file  . Transportation needs - medical: Not on file  . Transportation needs - non-medical: Not on file  Occupational History  . Not on file  Tobacco Use  . Smoking status: Former Smoker    Packs/day: 0.30    Years: 15.00    Pack years: 4.50    Types: Cigarettes    Last attempt to quit: 03/17/1977    Years since quitting: 40.0  . Smokeless tobacco: Never Used  Substance and Sexual Activity  . Alcohol use: Yes    Comment: 03/27/2017 "nothing in 6 months; used to have glass of wine q hs"  . Drug use: No  . Sexual activity: Not on file  Other Topics Concern  . Not on file  Social History Narrative  . Not on file    Family History:    Family History  Problem Relation Age of Onset  . Cirrhosis Neg Hx      ROS:  Please see the history of present illness.  ROS All other ROS reviewed and negative.     Physical Exam/Data:   Vitals:   03/27/17 1857 03/27/17 2150 03/28/17 0457 03/28/17 0915  BP: 123/71 116/75 105/62 102/60  Pulse: 64 71 70 68  Resp:  20 20 18   Temp: 98.4 F (36.9 C) 97.8 F (36.6 C) (!) 97.5 F (36.4 C) (!) 97.5 F (36.4 C)  TempSrc: Oral Oral Oral Oral  SpO2: 94% 96% 95% 97%  Weight: 196 lb 8 oz (89.1 kg)  192 lb 3.2 oz (87.2 kg)   Height: 5' 11"  (1.803 m)       Intake/Output Summary (Last 24 hours) at 03/28/2017 1255 Last data filed at 03/28/2017 1129 Gross per 24 hour  Intake 600 ml  Output 4025 ml  Net -3425 ml   Filed Weights   03/27/17 1213 03/27/17 1857 03/28/17 0457  Weight: 201 lb (91.2 kg) 196 lb 8 oz (89.1 kg) 192 lb 3.2 oz (87.2 kg)   Body mass index is 26.81 kg/m.  General:  Well nourished, well developed, in no acute distress HEENT: normal Lymph: no adenopathy Neck: + JVD Endocrine:  No thryomegaly Vascular: No carotid bruits; FA pulses 2+  bilaterally without bruits  Cardiac:  normal S1, S2; RRR; no murmur  Lungs:  Diminished breath sound at base bilaterally Abd: soft, nontender, no hepatomegaly. Distended Ext: 2+ edema Musculoskeletal:  No deformities, BUE and BLE strength normal and equal Skin: warm and dry  Neuro:  CNs 2-12 intact, no focal abnormalities noted Psych:  Normal affect   EKG:  The EKG was personally reviewed and demonstrates: Sinus rhythm with right bundle branch block.  No acute ischemic changes. Telemetry:  Telemetry  was personally reviewed and demonstrates: Sinus rhythm with frequent PVCs.  Relevant CV Studies: Cardiac cath 09/30/16:  Angiographic findings  Cardiac Arteries and Lesion Findings LMCA: 0%. LAD: 0%. LCx: Lesion on Mid CX: 30% stenosis. RCA: 0%. Procedure Data Procedure Date Date: 09/30/2016 Start: 13:10 Contrast Material - Omnipaque62 ml Fluoroscopy Time: Diagnostic: 4:07 minutes. Total: 4:07 minutes. Admission Data Admission Date: 09/27/2016 [place] Dominance: Right VA LV function assessed [id number]. Ejection Fraction - Method: LV gram. EF%: 40. Hemodynamics Condition: Rest Heart Rate: 71 bpm    Laboratory Data:  Chemistry Recent Labs  Lab 03/27/17 1215 03/27/17 1429 03/28/17 0203  NA 142  --  143  K 4.2  --  3.4*  CL 106  --  104  CO2 29  --  29  GLUCOSE 118*  --  100*  BUN 26*  --  24*  CREATININE 1.72* 1.68* 1.64*  CALCIUM 8.9  --  8.8*  GFRNONAA 37* 38* 39*  GFRAA 42* 44* 45*  ANIONGAP 7  --  10    No results for input(s): PROT, ALBUMIN, AST, ALT, ALKPHOS, BILITOT in the last 168 hours. Hematology Recent Labs  Lab 03/27/17 1215  WBC 7.5  RBC 4.55  HGB 11.7*  HCT 38.8*  MCV 85.3  MCH 25.7*  MCHC 30.2  RDW 19.9*  PLT 217   Cardiac Enzymes Recent Labs  Lab 03/27/17 1429 03/27/17 2007 03/28/17 0203  TROPONINI 0.19* 0.20* 0.22*    Recent Labs  Lab 03/27/17 1226  TROPIPOC 0.17*    BNP Recent Labs  Lab 03/27/17 1215    BNP 489.5*    DDimer No results for input(s): DDIMER in the last 168 hours.  Radiology/Studies:  Dg Chest 2 View  Result Date: 03/27/2017 CLINICAL DATA:  Midline lower chest tightness.  Shortness of breath. EXAM: CHEST  2 VIEW COMPARISON:  Chest CT 03/20/2017.  Chest radiograph 11/25/2016 FINDINGS: Cardiac silhouette remains upper limits of normal. There continues to be evidence for bilateral pleural effusions, left side greater than right. Upper lungs are clear without pulmonary edema. Atherosclerotic calcifications at the aortic arch. Trachea is midline. Bridging osteophytes throughout the thoracic spine. Degenerative changes in both shoulders and AC joints. IMPRESSION: Bilateral pleural effusions, left side greater than right. Left pleural effusion is small-moderate in size. Right pleural effusion is small in size. Minimal change from recent chest CT. Electronically Signed   By: Markus Daft M.D.   On: 03/27/2017 13:37    Assessment and Plan:   1.  Acute on chronic combined CHF -Echo this admission showed stable EF of 40-45%.  Progressive weight gain due to noncompliance with salt intake.  He was on 40 mg of p.o. Lasix at home. - Net I & O negative 3.4L. Weight down 9lb (201-->192lb). Seems baseline weight between 180-185lb.  - Continue IV lasix 70m BID. (he was on Toprol XL on 263mqd when seen by Dr. JuClydie Braun1/5/18).  - Heart failure education given for >20 minutes. He will benefits from diatition consult.  - Will start Coreg 3.12511mID. Consider adding ACE/ARB prior to discharge.    2. Elevated troponin - Mild elevated trend of 0.19-->0.2-->0.22. Likely due to volume overload. [facility] non obstructive [facility] 3. CKD stage III - Scr stable. Baseline 1.5-1.7.  4. Hx of NSVT - Was on amiodarone but discontinued due to intolerance. Prior event monitor unremarkable.  - Noted frequent PVCs.  - Keep K of 4. Check meg - Add BB  as above.   5. Hypokalemia - As  above. Will give additional 75mq x 1 on regular dose.   6. HLD - No results found for requested labs within last 8760 hours.  - Continue Crestor 180m   For questions or updates, please contact [facility] consult [email or web address] for contact info under Cardiology/STEMI.   SiJarrett SohoPAUtah12/14/2018 12:55 PM   History and all data above reviewed.  Patient examined.  I agree with the findings as above.   The patient has a non ischemic CM with acute on chronic systolic and diastolic HF.  He has had increasing SOB x 1 months with weight gain.  He does watch his weight but he does not adjust his meds.  He watches his salt but could do better.   The patient exam reveals COR:RRR  ,  Lungs: Decreased breath sounds  ,  Abd: Mild abdominal distension, Ext Moderate edema  .  All available labs, radiology testing, previous records reviewed. Agree with documented assessment and plan.  ACUTE ON CHRONIC SYSTOLIC AND DIASTOLIC HF:  Long discussion with the patient about salt and fluid restriction and the logic around med management.  I will suggest starting a low dose beta blocker and then we can titrate ARB as an out patient.  We will arrange follow up with Dr. JoMartinique  JaJeneen Rinksochrein  2:11 PM  03/28/2017

## 2017-03-28 NOTE — Progress Notes (Signed)
  Echocardiogram 2D Echocardiogram has been performed.  Dillon Andrews 03/28/2017, 9:00 AM

## 2017-03-28 NOTE — Progress Notes (Addendum)
PROGRESS NOTE  Dillon Andrews VXB:[id number] DOB: 08-Apr-1941 DOA: 03/27/2017 PCP: [name], MD   LOS: 1 day   Brief Narrative / Interim history: 76 y.o. male with medical history significant for diabetes, hypertension, chronic systolic heart failure, chronic kidney disease, pleural effusion, elevated troponin presents to emergency Department chief complaint of persistent worsening shortness of breath. Initial evaluation concerning for acute on chronic systolic heart failure.  He was placed on Lasix and he was admitted to the hospital  Assessment & Plan: Principal Problem:   Acute on chronic systolic heart failure (HCC) Active Problems:   CKD (chronic kidney disease) stage 3, GFR 30-59 ml/min (HCC)   HTN (hypertension)   DM2 (diabetes mellitus, type 2) (HCC)   Bilateral pleural effusion   Elevated troponin   Acute on chronic combined systolic and diastolic CHF -Patient was placed on IV Lasix, he is diuresing well, renal function is stable, continue for now -Discussed extensively with the patient at bedside, he is on 20 mg of Lasix twice daily at home which may not be enough, he also does not seem compliant with salt restriction as he often like to have canned [place] chicken soup and crackers, eats out about twice a week as well.  He feels like his shortness of breath has been persistent despite being hospitalized 3 times in [facility] with acute on chronic CHF and diuresed aggressively every time -Given recurrent hospitalizations in a HF patient and the fact that he wants to change his care from [facility] to [place], I have consulted cardiology, appreciate input -He is net -3.4 L this morning, his weight has improved from 196 >>192 this morning -Per patient's report, he had a cardiac catheterization in [facility] which was negative for significant coronary artery disease  Elevated troponin -Flat, not in a pattern consistent with ACS, likely demand  ischemia  Hypertension -Currently on IV Lasix, renal function is doing with diuresis but will benefit from an ACE inhibitor and a beta-blocker if blood pressure and renal function allow.  Currently heart rate is 68, blood pressure 102/60.  His cardiologist in [facility] stated that he has been bradycardic into the 50s and has been unable to tolerate beta-blockers  Chronic kidney disease stage III -Baseline creatinine around 1.5-1.6, currently at baseline, continue diuresis and closely monitor, avoid nephrotoxic agents  Type 2 diabetes mellitus -Most recent hemoglobin A1c was 6.4 in August 2018.  Continue sliding scale -Continue gabapentin for probable neuropathy  Hyperlipidemia -Continue Crestor   DVT prophylaxis: heparin Code Status: Full code Family Communication: no family at bedside Disposition Plan: home when ready   Consultants:   Cardiology   Procedures:   2D echo:  Study Conclusions - Left ventricle: The cavity size was normal. There was mild concentric hypertrophy. Systolic function was mildly to moderately reduced. The estimated ejection fraction was in the range of 40% to 45%. Diffuse hypokinesis. Features are consistent with a pseudonormal left ventricular filling pattern, with concomitant abnormal relaxation and increased filling pressure (grade 2 diastolic dysfunction). - Ventricular septum: Septal motion showed abnormal function and dyssynergy. - Aortic valve: Trileaflet; mildly thickened, mildly calcified leaflets. - Mitral valve: There was mild regurgitation. - Left atrium: The atrium was mildly dilated. Volume/bsa, ES, (1-plane Simpson&'s, A2C): 36.1 ml/m^2. - Right ventricle: The cavity size was mildly dilated. Wall thickness was normal. - Right atrium: The atrium was severely dilated. - Tricuspid valve: There was moderate regurgitation. - Pulmonary arteries: Systolic pressure was mildly increased. PA peak pressure:  40 mm Hg (S). - Pericardium, extracardiac:  There was a left pleural effusion.  Antimicrobials:  None    Subjective: History of a little bit better this morning, thinks his breathing has somewhat improved from admission.  No chest pain.  Objective: Vitals:   03/27/17 1857 03/27/17 2150 03/28/17 0457 03/28/17 0915  BP: 123/71 116/75 105/62 102/60  Pulse: 64 71 70 68  Resp:  20 20 18   Temp: 98.4 F (36.9 C) 97.8 F (36.6 C) (!) 97.5 F (36.4 C) (!) 97.5 F (36.4 C)  TempSrc: Oral Oral Oral Oral  SpO2: 94% 96% 95% 97%  Weight: 89.1 kg (196 lb 8 oz)  87.2 kg (192 lb 3.2 oz)   Height: 5' 11"  (1.803 m)       Intake/Output Summary (Last 24 hours) at 03/28/2017 1144 Last data filed at 03/28/2017 1129 Gross per 24 hour  Intake 600 ml  Output 4025 ml  Net -3425 ml   Filed Weights   03/27/17 1213 03/27/17 1857 03/28/17 0457  Weight: 91.2 kg (201 lb) 89.1 kg (196 lb 8 oz) 87.2 kg (192 lb 3.2 oz)    Examination:  Constitutional: NAD Eyes: lids and conjunctivae normal ENMT: Mucous membranes are moist.  Neck: normal, supple Respiratory: Decreased breath sounds at the bases, faint crackles. Normal respiratory effort. No accessory muscle use.  Cardiovascular: Regular rate and rhythm, no murmurs / rubs / gallops. 1 +LE edema. 2+ pedal pulses. + JVD Abdomen: no tenderness. Bowel sounds positive.  Musculoskeletal: no clubbing / cyanosis.  Skin: no rashes, lesions, ulcers. No induration Neurologic: non focal, ambulatory Psychiatric: Normal judgment and insight. Alert and oriented x 3. Normal mood.    Data Reviewed: I have independently reviewed following labs and imaging studies  CBC: Recent Labs  Lab 03/27/17 1215  WBC 7.5  HGB 11.7*  HCT 38.8*  MCV 85.3  PLT 010   Basic Metabolic Panel: Recent Labs  Lab 03/27/17 1215 03/27/17 1429 03/28/17 0203  NA 142  --  143  K 4.2  --  3.4*  CL 106  --  104  CO2 29  --  29  GLUCOSE 118*  --  100*  BUN 26*  --  24*  CREATININE 1.72* 1.68* 1.64*  CALCIUM 8.9  --   8.8*   GFR: Estimated Creatinine Clearance: 40.2 mL/min (A) (by C-G formula based on SCr of 1.64 mg/dL (H)). Liver Function Tests: No results for input(s): AST, ALT, ALKPHOS, BILITOT, PROT, ALBUMIN in the last 168 hours. No results for input(s): LIPASE, AMYLASE in the last 168 hours. No results for input(s): AMMONIA in the last 168 hours. Coagulation Profile: No results for input(s): INR, PROTIME in the last 168 hours. Cardiac Enzymes: Recent Labs  Lab 03/27/17 1429 03/27/17 2007 03/28/17 0203  TROPONINI 0.19* 0.20* 0.22*   BNP (last 3 results) No results for input(s): PROBNP in the last 8760 hours. HbA1C: No results for input(s): HGBA1C in the last 72 hours. CBG: Recent Labs  Lab 03/27/17 1656 03/27/17 2245 03/28/17 0743 03/28/17 1128  GLUCAP 115* 109* 97 129*   Lipid Profile: No results for input(s): CHOL, HDL, LDLCALC, TRIG, CHOLHDL, LDLDIRECT in the last 72 hours. Thyroid Function Tests: No results for input(s): TSH, T4TOTAL, FREET4, T3FREE, THYROIDAB in the last 72 hours. Anemia Panel: No results for input(s): VITAMINB12, FOLATE, FERRITIN, TIBC, IRON, RETICCTPCT in the last 72 hours. Urine analysis:    Component Value Date/Time   COLORURINE YELLOW 11/19/2016 [place] 11/19/2016 1326  LABSPEC 1.006 11/19/2016 1326   PHURINE 7.5 11/19/2016 1326   GLUCOSEU NEGATIVE 11/19/2016 1326   HGBUR NEGATIVE 11/19/2016 1326   BILIRUBINUR NEGATIVE 11/19/2016 1326   KETONESUR NEGATIVE 11/19/2016 1326   PROTEINUR NEGATIVE 11/19/2016 1326   UROBILINOGEN 1.0 06/06/2008 0949   NITRITE NEGATIVE 11/19/2016 1326   LEUKOCYTESUR NEGATIVE 11/19/2016 1326   Sepsis Labs: Invalid input(s): PROCALCITONIN, LACTICIDVEN  No results found for this or any previous visit (from the past 240 hour(s)).    Radiology Studies: Dg Chest 2 View  Result Date: 03/27/2017 CLINICAL DATA:  Midline lower chest tightness.  Shortness of breath. EXAM: CHEST  2 VIEW COMPARISON:  Chest  CT 03/20/2017.  Chest radiograph 11/25/2016 FINDINGS: Cardiac silhouette remains upper limits of normal. There continues to be evidence for bilateral pleural effusions, left side greater than right. Upper lungs are clear without pulmonary edema. Atherosclerotic calcifications at the aortic arch. Trachea is midline. Bridging osteophytes throughout the thoracic spine. Degenerative changes in both shoulders and AC joints. IMPRESSION: Bilateral pleural effusions, left side greater than right. Left pleural effusion is small-moderate in size. Right pleural effusion is small in size. Minimal change from recent chest CT. Electronically Signed   By: Markus Daft M.D.   On: 03/27/2017 13:37     Scheduled Meds: . aspirin EC  81 mg Oral Daily  . cyanocobalamin  500 mcg Oral QODAY  . furosemide  40 mg Intravenous BID  . gabapentin  800 mg Oral QHS  . heparin  5,000 Units Subcutaneous Q8H  . insulin aspart  0-5 Units Subcutaneous QHS  . insulin aspart  0-9 Units Subcutaneous TID WC  . pantoprazole  40 mg Oral Daily  . potassium chloride SA  20 mEq Oral Daily  . rosuvastatin  10 mg Oral Daily  . sodium chloride flush  3 mL Intravenous Q12H   Continuous Infusions: . sodium chloride         Time spent: 35 minutes, > 50% bedside involved in CHF education    Marzetta Board, MD, PhD [facility] Pager [phone]  If 7PM-7AM, please contact night-coverage [email or web address] Password [email or web address] 03/28/2017, 11:44 AM

## 2017-03-28 NOTE — Progress Notes (Addendum)
Heart Failure Navigator Consult Note  Presentation:  Per Dillon Carrel NP- Dillon Andrews is a delightful 76 y.o. male with medical history significant for diabetes, hypertension, chronic systolic heart failure, chronic kidney disease, pleural effusion, elevated troponin presents to emergency Department chief complaint of persistent worsening shortness of breath. Initial evaluation concerning for acute on chronic systolic heart failure. [facility] are asked to admit   He states he was in the hospital in August of this year with pneumonia and quite frankly has "not been quite right since". He professes to have persistent shortness of breath that has worsened over the last several weeks. So she had symptoms include intermittent chest pain lower extremity edema. He states that bending over makes the shortness of breath worse and nothing makes it better. He is compliant with his medications that include Lasix. He weighs daily and reports a 1 pound weight gain over the last 3 days. He reports he had a cardiac catheterization in June of this year at [facility] which showed normal coronary arteries an EF of 45%. He denies headache dizziness syncope or near-syncope. He denies cough fever diarrhea constipation melena bright red blood per rectum. He denies dysuria hematuria frequency or urgency.   Past Medical History:  Diagnosis Date  . Anemia 11/2016  . Arthritis    "some; hands/fingers" (03/27/2017)  . Barrett's esophagus   . Basal cell carcinoma    "face, arms, hands" (03/27/2017)  . CHF (congestive heart failure) ([place])   . Chicken pox   . CKD (chronic kidney disease), stage III ([place])   . Esophageal varices ([place])   . GERD (gastroesophageal reflux disease)   . High cholesterol   . Hypertension   . Pneumonia 09/2016  . Type II diabetes mellitus ([place])     Social History   Socioeconomic History  . Marital status: Married    Spouse name: None  . Number of children:  None  . Years of education: None  . Highest education level: None  Social Needs  . Financial resource strain: None  . Food insecurity - worry: None  . Food insecurity - inability: None  . Transportation needs - medical: None  . Transportation needs - non-medical: None  Occupational History  . None  Tobacco Use  . Smoking status: Former Smoker    Packs/day: 0.30    Years: 15.00    Pack years: 4.50    Types: Cigarettes    Last attempt to quit: 03/17/1977    Years since quitting: 40.0  . Smokeless tobacco: Never Used  Substance and Sexual Activity  . Alcohol use: Yes    Comment: 03/27/2017 "nothing in 6 months; used to have glass of wine q hs"  . Drug use: No  . Sexual activity: None  Other Topics Concern  . None  Social History Narrative  . None    ECHO: pending  BNP    Component Value Date/Time   BNP 489.5 (H) 03/27/2017 1215    ProBNP No results found for: PROBNP   Education Assessment and Provision:  Detailed education and instructions provided on heart failure disease management including the following:  Signs and symptoms of Heart Failure When to call the physician Importance of daily weights Low sodium diet Fluid restriction Medication management Anticipated future follow-up appointments  Patient education given on each of the above topics.  Patient acknowledges understanding and acceptance of all instructions.  I spoke with Dillon Andrews regarding his hospitalization and HF diagnosis.  He tells  me that he has a scale and weighs each day.  I reviewed the importance of daily weights and when to contact the physician.  He admits that he has not been careful with sodium recently and that he and his wife often eat out because she hasn't been feeling well (back pain).  He also says that he "loves" chicken noodle soup and eats it frequently.  I reviewed a low sodium diet and discouraged high sodium foods.  He denies any issues getting or taking prescribed medications.   He has been going to cardiologist in [place] and wishes to move his care --he will now follow with [facility].  Education Materials:  "Living Better With Heart Failure" Booklet, Daily Weight Tracker Tool    High Risk Criteria for Readmission and/or Poor Patient Outcomes:   EF <30%- New echo pending  2 or more admissions in 6 months-No-2/31mo Difficult social situation- denies-lives with wife in [place] Demonstrates medication noncompliance-denies    Barriers of Care:  Knowledge and compliance  Discharge Planning:   Plans to return to home with wife in [place]  He also has daughters locally to provide assistance as needed.

## 2017-03-28 NOTE — Care Management Note (Signed)
Case Management Note  Patient Details  Name: Dillon Andrews MRN: [id number] Date of Birth: 29-Mar-1941  Subjective/Objective:                 Spoke to patient at the bedside. CHF exac. Reviewed diet, weigh every day and call for 2 pounds in a day or 5 in a week. Patient lives at home w wife and denies getting mediaction or getting to doctor.  PCP K[facility], [facility] but wants to switch to [facility]    Action/Plan:   Expected Discharge Date:                  Expected Discharge Plan:  Home/Self Care  In-House Referral:     Discharge planning Services  CM Consult  Post Acute Care Choice:    Choice offered to:     DME Arranged:    DME Agency:     HH Arranged:    HH Agency:     Status of Service:  In process, will continue to follow  If discussed at Long Length of Stay Meetings, dates discussed:    Additional Comments:  Carles Collet, RN 03/28/2017, 2:30 PM

## 2017-03-29 LAB — BASIC METABOLIC PANEL
ANION GAP: 12 (ref 5–15)
BUN: 25 mg/dL — ABNORMAL HIGH (ref 6–20)
CALCIUM: 8.8 mg/dL — AB (ref 8.9–10.3)
CO2: 29 mmol/L (ref 22–32)
CREATININE: 1.67 mg/dL — AB (ref 0.61–1.24)
Chloride: 102 mmol/L (ref 101–111)
GFR calc Af Amer: 44 mL/min — ABNORMAL LOW (ref >60)
GFR, EST NON AFRICAN AMERICAN: 38 mL/min — AB (ref >60)
GLUCOSE: 101 mg/dL — AB (ref 65–99)
Potassium: 3.3 mmol/L — ABNORMAL LOW (ref 3.5–5.1)
Sodium: 143 mmol/L (ref 135–145)

## 2017-03-29 LAB — GLUCOSE, CAPILLARY
GLUCOSE-CAPILLARY: 97 mg/dL (ref 65–99)
GLUCOSE-CAPILLARY: 122 mg/dL — AB (ref 65–99)
Glucose-Capillary: 92 mg/dL (ref 65–99)
Glucose-Capillary: 107 mg/dL — ABNORMAL HIGH (ref 65–99)

## 2017-03-29 LAB — HEMOGLOBIN A1C
Hgb A1c MFr Bld: 6.9 % — ABNORMAL HIGH (ref 4.8–5.6)
Mean Plasma Glucose: 151 mg/dL

## 2017-03-29 MED ORDER — FUROSEMIDE 40 MG PO TABS
40.0000 mg | ORAL_TABLET | Freq: Two times a day (BID) | ORAL | Status: DC
  Administered 2017-03-29 – 2017-03-30 (×2): 40 mg via ORAL
  Filled 2017-03-29 (×2): qty 1

## 2017-03-29 MED ORDER — MAGNESIUM OXIDE 400 (241.3 MG) MG PO TABS
400.0000 mg | ORAL_TABLET | Freq: Once | ORAL | Status: AC
  Administered 2017-03-29: 400 mg via ORAL
  Filled 2017-03-29: qty 1

## 2017-03-29 MED ORDER — POTASSIUM CHLORIDE CRYS ER 20 MEQ PO TBCR
40.0000 meq | EXTENDED_RELEASE_TABLET | Freq: Once | ORAL | Status: AC
  Administered 2017-03-29: 40 meq via ORAL
  Filled 2017-03-29: qty 2

## 2017-03-29 NOTE — Progress Notes (Signed)
23mq potassium and 402mmagnesium oxide PO given as per cardiologist order.

## 2017-03-29 NOTE — Progress Notes (Addendum)
While ambulating now patient has a 15 beats of V-tach, pt is asymptomatic, walking and said he is fine and not feeling anything, vitals stable, will continue to monitor the patient, Cadiologist being paged, waiting response

## 2017-03-29 NOTE — Plan of Care (Signed)
  Education: Knowledge of General Education information will improve 03/29/2017 [phone] - Completed/Met by Evert Kohl, RN

## 2017-03-29 NOTE — Progress Notes (Signed)
Pt is ambulated in a hallway once in a shift, SPO2 maintained at 98% in RA, vitals stable, patient verbalizing feeling better, pt's IV lasix changed into oral, resting well now, no any complain of pain, denies SOB and CO, family members updating, will continue to monitor the patient

## 2017-03-29 NOTE — Progress Notes (Signed)
PROGRESS NOTE  Dillon Andrews SJG:[id number] DOB: 1940-04-17 DOA: 03/27/2017 PCP: [name], MD   LOS: 2 days   Brief Narrative  76 y.o. male with medical history significant for diabetes, hypertension, chronic systolic heart failure, chronic kidney disease, pleural effusion, elevated troponin presents to emergency Department chief complaint of persistent worsening shortness of breath. Initial evaluation concerning for acute on chronic systolic heart failure.  He was placed on Lasix and he was admitted to the hospital  Assessment & Plan:  Acute on chronic combined systolic and diastolic CHF - Patient was placed on IV Lasix, he is diuresing well, renal function is stable, continue for now - Cardio following, okay to switch to lasix PO today - Monitor daily weight and strict intake and output   Elevated troponin - Likely demand ischemic due to CKD - No chest pain   Hypertension, essential - Stable   Chronic kidney disease stage III - Baseline Cr 1.5 - 1.6 - Cr within baseline range   Type 2 diabetes mellitus with diabetic neuropathy  - A1c was 6.4 in August 2018 - Continue SSI - Continue gabapentin for neuroapthy  Hyperlipidemia - Continue Crestor    DVT prophylaxis: Heparin  Code Status: Full code Family Communication: spoke with daughter at bedside Disposition Plan: home in am  Consultants:   Cardio   Procedures:   ECHO: EF 40-45% and grade 2 DD  Antimicrobials:  None     Subjective: No overnight events.  Objective: Vitals:   03/28/17 1832 03/28/17 1950 03/29/17 0600 03/29/17 1211  BP: 111/66 106/64 123/72 (!) 99/59  Pulse: 67 68 69 72  Resp: 18 18 18 20   Temp: 98.5 F (36.9 C) 98 F (36.7 C) 97.8 F (36.6 C) 98.1 F (36.7 C)  TempSrc: Oral Oral Oral Oral  SpO2: 95% 96% 95% 96%  Weight:   85.4 kg (188 lb 4.8 oz)   Height:        Intake/Output Summary (Last 24 hours) at 03/29/2017 1249 Last data filed at 03/29/2017 0934 Gross  per 24 hour  Intake 480 ml  Output 2225 ml  Net -1745 ml   Filed Weights   03/27/17 1857 03/28/17 0457 03/29/17 0600  Weight: 89.1 kg (196 lb 8 oz) 87.2 kg (192 lb 3.2 oz) 85.4 kg (188 lb 4.8 oz)    Physical Exam  Constitutional: Appears well-developed and well-nourished. No distress.  CVS: Rate controlled, S1/S2 + Pulmonary: Effort and breath sounds normal, no stridor, rhonchi, wheezes, rales.  Abdominal: Soft. BS +,  no distension, tenderness, rebound or guarding.  Musculoskeletal: Normal range of motion. No tenderness. +1-2 LE edema  Lymphadenopathy: No lymphadenopathy noted, cervical, inguinal. Neuro: Alert. Normal reflexes, muscle tone coordination. No cranial nerve deficit. Skin: Skin is warm and dry. No rash noted.  Psychiatric: Normal mood and affect. Behavior, judgment, thought content normal.    Data Reviewed: I have independently reviewed following labs and imaging studies  CBC: Recent Labs  Lab 03/27/17 1215  WBC 7.5  HGB 11.7*  HCT 38.8*  MCV 85.3  PLT 654   Basic Metabolic Panel: Recent Labs  Lab 03/27/17 1215 03/27/17 1429 03/28/17 0203 03/28/17 1437 03/29/17 0517  NA 142  --  143  --  143  K 4.2  --  3.4*  --  3.3*  CL 106  --  104  --  102  CO2 29  --  29  --  29  GLUCOSE 118*  --  100*  --  101*  BUN 26*  --  24*  --  25*  CREATININE 1.72* 1.68* 1.64*  --  1.67*  CALCIUM 8.9  --  8.8*  --  8.8*  MG  --   --   --  1.6*  --    GFR: Estimated Creatinine Clearance: 39.5 mL/min (A) (by C-G formula based on SCr of 1.67 mg/dL (H)). Liver Function Tests: No results for input(s): AST, ALT, ALKPHOS, BILITOT, PROT, ALBUMIN in the last 168 hours. No results for input(s): LIPASE, AMYLASE in the last 168 hours. No results for input(s): AMMONIA in the last 168 hours. Coagulation Profile: No results for input(s): INR, PROTIME in the last 168 hours. Cardiac Enzymes: Recent Labs  Lab 03/27/17 1429 03/27/17 2007 03/28/17 0203  TROPONINI 0.19* 0.20*  0.22*   BNP (last 3 results) No results for input(s): PROBNP in the last 8760 hours. HbA1C: Recent Labs    03/27/17 1429  HGBA1C 6.9*   CBG: Recent Labs  Lab 03/28/17 1128 03/28/17 1704 03/28/17 2114 03/29/17 0735 03/29/17 1139  GLUCAP 129* 152* 117* 92 97   Lipid Profile: No results for input(s): CHOL, HDL, LDLCALC, TRIG, CHOLHDL, LDLDIRECT in the last 72 hours. Thyroid Function Tests: No results for input(s): TSH, T4TOTAL, FREET4, T3FREE, THYROIDAB in the last 72 hours. Anemia Panel: No results for input(s): VITAMINB12, FOLATE, FERRITIN, TIBC, IRON, RETICCTPCT in the last 72 hours. Urine analysis:    Component Value Date/Time   COLORURINE YELLOW 11/19/2016 [place] 11/19/2016 1326   LABSPEC 1.006 11/19/2016 1326   PHURINE 7.5 11/19/2016 1326   GLUCOSEU NEGATIVE 11/19/2016 1326   HGBUR NEGATIVE 11/19/2016 1326   BILIRUBINUR NEGATIVE 11/19/2016 1326   KETONESUR NEGATIVE 11/19/2016 1326   PROTEINUR NEGATIVE 11/19/2016 1326   UROBILINOGEN 1.0 06/06/2008 0949   NITRITE NEGATIVE 11/19/2016 1326   LEUKOCYTESUR NEGATIVE 11/19/2016 1326   Sepsis Labs: Invalid input(s): PROCALCITONIN, LACTICIDVEN  No results found for this or any previous visit (from the past 240 hour(s)).    Radiology Studies: Dg Chest 2 View  Result Date: 03/27/2017 CLINICAL DATA:  Midline lower chest tightness.  Shortness of breath. EXAM: CHEST  2 VIEW COMPARISON:  Chest CT 03/20/2017.  Chest radiograph 11/25/2016 FINDINGS: Cardiac silhouette remains upper limits of normal. There continues to be evidence for bilateral pleural effusions, left side greater than right. Upper lungs are clear without pulmonary edema. Atherosclerotic calcifications at the aortic arch. Trachea is midline. Bridging osteophytes throughout the thoracic spine. Degenerative changes in both shoulders and AC joints. IMPRESSION: Bilateral pleural effusions, left side greater than right. Left pleural effusion is  small-moderate in size. Right pleural effusion is small in size. Minimal change from recent chest CT. Electronically Signed   By: Markus Daft M.D.   On: 03/27/2017 13:37     Scheduled Meds: . aspirin EC  81 mg Oral Daily  . carvedilol  3.125 mg Oral BID WC  . cyanocobalamin  500 mcg Oral QODAY  . furosemide  40 mg Intravenous BID  . gabapentin  800 mg Oral QHS  . heparin  5,000 Units Subcutaneous Q8H  . insulin aspart  0-5 Units Subcutaneous QHS  . insulin aspart  0-9 Units Subcutaneous TID WC  . pantoprazole  40 mg Oral Daily  . potassium chloride SA  20 mEq Oral Daily  . rosuvastatin  10 mg Oral Daily  . sodium chloride flush  3 mL Intravenous Q12H   Continuous Infusions: . sodium chloride      Time spent: 25  minutes, > 50% bedside involved in CHF education   [facility] Pager [phone]  If 7PM-7AM, please contact night-coverage [email or web address] Password [facility] 03/29/2017, 12:49 PM

## 2017-03-29 NOTE — Progress Notes (Signed)
Patient refused bed alarm. Will continue to monitor patient. 

## 2017-03-29 NOTE — Progress Notes (Signed)
Progress Note  Patient Name: Dillon Andrews Date of Encounter: 03/29/2017  Primary Cardiologist: Dillon Andrews   Subjective   Feeling well.  His walker in the halls without issue.  No further shortness of breath.  Inpatient Medications    Scheduled Meds: . aspirin EC  81 mg Oral Daily  . carvedilol  3.125 mg Oral BID WC  . cyanocobalamin  500 mcg Oral QODAY  . furosemide  40 mg Intravenous BID  . gabapentin  800 mg Oral QHS  . heparin  5,000 Units Subcutaneous Q8H  . insulin aspart  0-5 Units Subcutaneous QHS  . insulin aspart  0-9 Units Subcutaneous TID WC  . pantoprazole  40 mg Oral Daily  . potassium chloride SA  20 mEq Oral Daily  . rosuvastatin  10 mg Oral Daily  . sodium chloride flush  3 mL Intravenous Q12H   Continuous Infusions: . sodium chloride     PRN Meds: sodium chloride, acetaminophen, clonazePAM, ondansetron (ZOFRAN) IV, sodium chloride flush   Vital Signs    Vitals:   03/28/17 0915 03/28/17 1832 03/28/17 1950 03/29/17 0600  BP: 102/60 111/66 106/64 123/72  Pulse: 68 67 68 69  Resp: 18 18 18 18   Temp: (!) 97.5 F (36.4 C) 98.5 F (36.9 C) 98 F (36.7 C) 97.8 F (36.6 C)  TempSrc: Oral Oral Oral Oral  SpO2: 97% 95% 96% 95%  Weight:    188 lb 4.8 oz (85.4 kg)  Height:        Intake/Output Summary (Last 24 hours) at 03/29/2017 1142 Last data filed at 03/29/2017 0934 Gross per 24 hour  Intake 480 ml  Output 2225 ml  Net -1745 ml   Filed Weights   03/27/17 1857 03/28/17 0457 03/29/17 0600  Weight: 196 lb 8 oz (89.1 kg) 192 lb 3.2 oz (87.2 kg) 188 lb 4.8 oz (85.4 kg)    Telemetry    Sinus rhythm- Personally Reviewed  ECG    Sinus rhythm, right bundle branch block- Personally Reviewed  Physical Exam   GEN: No acute distress.   Neck: No JVD Cardiac: RRR, no murmurs, rubs, or gallops.  Respiratory: Clear to auscultation bilaterally. GI: Soft, nontender, non-distended  MS: No edema; No deformity. Neuro:  Nonfocal  Psych: Normal  affect   Labs    Chemistry Recent Labs  Lab 03/27/17 1215 03/27/17 1429 03/28/17 0203 03/29/17 0517  NA 142  --  143 143  K 4.2  --  3.4* 3.3*  CL 106  --  104 102  CO2 29  --  29 29  GLUCOSE 118*  --  100* 101*  BUN 26*  --  24* 25*  CREATININE 1.72* 1.68* 1.64* 1.67*  CALCIUM 8.9  --  8.8* 8.8*  GFRNONAA 37* 38* 39* 38*  GFRAA 42* 44* 45* 44*  ANIONGAP 7  --  10 12     Hematology Recent Labs  Lab 03/27/17 1215  WBC 7.5  RBC 4.55  HGB 11.7*  HCT 38.8*  MCV 85.3  MCH 25.7*  MCHC 30.2  RDW 19.9*  PLT 217    Cardiac Enzymes Recent Labs  Lab 03/27/17 1429 03/27/17 2007 03/28/17 0203  TROPONINI 0.19* 0.20* 0.22*    Recent Labs  Lab 03/27/17 1226  TROPIPOC 0.17*     BNP Recent Labs  Lab 03/27/17 1215  BNP 489.5*     DDimer No results for input(s): DDIMER in the last 168 hours.   Radiology    Dg Chest 2  View  Result Date: 03/27/2017 CLINICAL DATA:  Midline lower chest tightness.  Shortness of breath. EXAM: CHEST  2 VIEW COMPARISON:  Chest CT 03/20/2017.  Chest radiograph 11/25/2016 FINDINGS: Cardiac silhouette remains upper limits of normal. There continues to be evidence for bilateral pleural effusions, left side greater than right. Upper lungs are clear without pulmonary edema. Atherosclerotic calcifications at the aortic arch. Trachea is midline. Bridging osteophytes throughout the thoracic spine. Degenerative changes in both shoulders and AC joints. IMPRESSION: Bilateral pleural effusions, left side greater than right. Left pleural effusion is small-moderate in size. Right pleural effusion is small in size. Minimal change from recent chest CT. Electronically Signed   By: Dillon Andrews M.D.   On: 03/27/2017 13:37    Cardiac Studies   TTE 03/28/17 - Left ventricle: The cavity size was normal. There was mild   concentric hypertrophy. Systolic function was mildly to   moderately reduced. The estimated ejection fraction was in the   range of 40% to  45%. Diffuse hypokinesis. Features are consistent   with a pseudonormal left ventricular filling pattern, with   concomitant abnormal relaxation and increased filling pressure   (grade 2 diastolic dysfunction). - Ventricular septum: Septal motion showed abnormal function and   dyssynergy. - Aortic valve: Trileaflet; mildly thickened, mildly calcified   leaflets. - Mitral valve: There was mild regurgitation. - Left atrium: The atrium was mildly dilated. Volume/bsa, ES,   (1-plane Simpson&'s, A2C): 36.1 ml/m^2. - Right ventricle: The cavity size was mildly dilated. Wall   thickness was normal. - Right atrium: The atrium was severely dilated. - Tricuspid valve: There was moderate regurgitation. - Pulmonary arteries: Systolic pressure was mildly increased. PA   peak pressure: 40 mm Hg (S). - Pericardium, extracardiac: There was a left pleural effusion.  Patient Profile     76 y.o. male systolic heart failure, nonischemic cardiomyopathy, Dillon Andrews with esophageal varices, hypertension, diabetes type 2, PVCs, hyperlipidemia stage III CKD.  He presented to the hospital with volume overload and heart failure exacerbation.  Assessment & Plan    1.  Acute on chronic combined CHF Volume status appears to be stabilized.  Echo shows an EF 40-45%.  Net out 5.1 L. Okay to switch Lasix to p.o. 40 mg twice a day.  Did get heart failure education yesterday.  Continue carvedilol, would add low-dose losartan at discharge with follow-up basic metabolic in 1 week.  2. Elevated troponin Likely elevated due to heart failure exacerbation.  3. CKD stage III Creatinine stable throughout hospitalization.  4. Hx of NSVT Discontinued amiodarone due to intolerance.  Was recently started on carvedilol.  Continue as an outpatient.  5. HLD Continue Crestor    For questions or updates, please contact Dillon Andrews Please consult [email or web address] for contact info under Cardiology/STEMI.      Signed, Dillon Andrews  Dillon Leeds, MD  03/29/2017, 11:42 AM

## 2017-03-30 LAB — BASIC METABOLIC PANEL
ANION GAP: 10 (ref 5–15)
BUN: 27 mg/dL — AB (ref 6–20)
CALCIUM: 8.7 mg/dL — AB (ref 8.9–10.3)
CO2: 31 mmol/L (ref 22–32)
CREATININE: 1.51 mg/dL — AB (ref 0.61–1.24)
Chloride: 102 mmol/L (ref 101–111)
GFR calc Af Amer: 50 mL/min — ABNORMAL LOW (ref >60)
GFR calc non Af Amer: 43 mL/min — ABNORMAL LOW (ref >60)
GLUCOSE: 105 mg/dL — AB (ref 65–99)
Potassium: 3.4 mmol/L — ABNORMAL LOW (ref 3.5–5.1)
Sodium: 143 mmol/L (ref 135–145)

## 2017-03-30 LAB — GLUCOSE, CAPILLARY: GLUCOSE-CAPILLARY: 99 mg/dL (ref 65–99)

## 2017-03-30 LAB — CBC
HCT: 35.3 % — ABNORMAL LOW (ref 39.0–52.0)
HEMOGLOBIN: 11 g/dL — AB (ref 13.0–17.0)
MCH: 26 pg (ref 26.0–34.0)
MCHC: 31.2 g/dL (ref 30.0–36.0)
MCV: 83.5 fL (ref 78.0–100.0)
Platelets: 189 10*3/uL (ref 150–400)
RBC: 4.23 MIL/uL (ref 4.22–5.81)
RDW: 19.8 % — ABNORMAL HIGH (ref 11.5–15.5)
WBC: 5.7 10*3/uL (ref 4.0–10.5)

## 2017-03-30 MED ORDER — FUROSEMIDE 40 MG PO TABS: 40.0000 mg | ORAL_TABLET | Freq: Two times a day (BID) | ORAL | 0 refills | Status: DC

## 2017-03-30 MED ORDER — CARVEDILOL 6.25 MG PO TABS
6.2500 mg | ORAL_TABLET | Freq: Two times a day (BID) | ORAL | Status: DC
  Administered 2017-03-30: 6.25 mg via ORAL
  Filled 2017-03-30: qty 1

## 2017-03-30 MED ORDER — CARVEDILOL 6.25 MG PO TABS: 6.2500 mg | ORAL_TABLET | Freq: Two times a day (BID) | ORAL | 0 refills | Status: DC

## 2017-03-30 NOTE — Final Progress Note (Signed)
Patient refused bed alarm. Daughter @ bedside. Will continue to monitor patient.

## 2017-03-30 NOTE — Discharge Summary (Signed)
Physician Discharge Summary  Dillon Andrews OBS:[id number] DOB: July 06, 1940 DOA: 03/27/2017  PCP: [name], MD  Admit date: 03/27/2017 Discharge date: 03/30/2017  Recommendations for Outpatient Follow-up:  1. Continue lasix 40 mg BID and potassium daily supplementation   Discharge Diagnoses:  Principal Problem:   Acute on chronic systolic heart failure (HCC) Active Problems:   CKD (chronic kidney disease) stage 3, GFR 30-59 ml/min (HCC)   HTN (hypertension)   DM2 (diabetes mellitus, type 2) (HCC)   Bilateral pleural effusion   Elevated troponin    Discharge Condition: stable   Diet recommendation: as tolerated   History of present illness:   76 y.o.malewith medical history significantfor diabetes, hypertension, chronic systolic heart failure, chronic kidney disease,pleural effusion, elevated troponin presents to emergency Department chief complaint of persistent worsening shortness of breath. Initial evaluation concerning for acute on chronic systolic heart failure.  He was placed on Lasix and he was admitted to the hospital   Hospital Course:   Acute on chronic combined systolic and diastolic CHF - Patient was placed on IV Lasix, he is diuresing well, renal function is stable, continue for now - Continue lasix 40 mg PO BID and potassium supplementation   Elevated troponin - Likely demand ischemic due to CKD - No chest pain   Hypertension, essential - Continue coreg   Chronic kidney disease stage III - Baseline Cr 1.5 - 1.6 - Cr within baseline range   Type 2 diabetes mellitus with diabetic neuropathy  - A1c was 6.4 in August 2018 - Continue home meds  Hyperlipidemia - Continue Crestor    DVT prophylaxis: Hep subQ Code Status: Full code Family Communication: no family at the bedside    Consultants:   Cardio   Procedures:   ECHO: EF 40-45% and grade 2 DD  Antimicrobials:  None        Signed:  Leisa Lenz,  MD  [facility] 03/30/2017, 11:10 AM  Pager #: [phone]  Time spent in minutes: more than 30 minutes    Discharge Exam: Vitals:   03/29/17 2024 03/30/17 0512  BP: 116/69 125/83  Pulse: 72 73  Resp: 20 20  Temp: 98.2 F (36.8 C) 97.8 F (36.6 C)  SpO2: 95% 96%   Vitals:   03/29/17 1211 03/29/17 1652 03/29/17 2024 03/30/17 0512  BP: (!) 99/59 100/60 116/69 125/83  Pulse: 72 70 72 73  Resp: 20  20 20   Temp: 98.1 F (36.7 C)  98.2 F (36.8 C) 97.8 F (36.6 C)  TempSrc: Oral  Oral Oral  SpO2: 96%  95% 96%  Weight:    81.5 kg (179 lb 9.6 oz)  Height:        General: Pt is alert, follows commands appropriately, not in acute distress Cardiovascular: Regular rate and rhythm, S1/S2 + Respiratory: Clear to auscultation bilaterally, no wheezing, no crackles, no rhonchi Abdominal: Soft, non tender, non distended, bowel sounds +, no guarding Extremities: no edema, no cyanosis, pulses palpable bilaterally DP and PT Neuro: Grossly nonfocal  Discharge Instructions  Discharge Instructions    Call MD for:  persistant nausea and vomiting   Complete by:  As directed    Call MD for:  redness, tenderness, or signs of infection (pain, swelling, redness, odor or green/yellow discharge around incision site)   Complete by:  As directed    Call MD for:  severe uncontrolled pain   Complete by:  As directed    Diet - low sodium heart healthy   Complete by:  As directed    Increase activity slowly   Complete by:  As directed      Allergies as of 03/30/2017   No Known Allergies     Medication List    TAKE these medications   aspirin EC 81 MG tablet Take 81 mg by mouth daily.   carvedilol 6.25 MG tablet Commonly known as:  COREG Take 1 tablet (6.25 mg total) by mouth 2 (two) times daily with a meal.   clonazePAM 1 MG tablet Commonly known as:  KLONOPIN Take 1 tablet (1 mg total) by mouth at bedtime. Only as needed.   furosemide 40 MG tablet Commonly known as:   LASIX Take 1 tablet (40 mg total) by mouth 2 (two) times daily. What changed:    how much to take  when to take this  Another medication with the same name was removed. Continue taking this medication, and follow the directions you see here.   gabapentin 800 MG tablet Commonly known as:  NEURONTIN Take 1 tablet (800 mg total) by mouth at bedtime.   glipiZIDE 5 MG tablet Commonly known as:  GLUCOTROL Take 2.5 mg by mouth 2 (two) times daily.   glucose blood test strip Use as instructed   omeprazole 40 MG capsule Commonly known as:  PRILOSEC Take 40 mg by mouth daily.   potassium chloride SA 20 MEQ tablet Commonly known as:  K-DUR,KLOR-CON Take 20 mEq by mouth daily.   rosuvastatin 10 MG tablet Commonly known as:  CRESTOR Take 10 mg by mouth daily.   vitamin B-12 500 MCG tablet Commonly known as:  CYANOCOBALAMIN Take 500 mcg by mouth every other day.      Follow-up Information    [name], MD. Schedule an appointment as soon as possible for a visit.   Specialty:  Family Medicine Contact information: [place] [address] [phone]            The results of significant diagnostics from this hospitalization (including imaging, microbiology, ancillary and laboratory) are listed below for reference.    Significant Diagnostic Studies: Dg Chest 2 View  Result Date: 03/27/2017 CLINICAL DATA:  Midline lower chest tightness.  Shortness of breath. EXAM: CHEST  2 VIEW COMPARISON:  Chest CT 03/20/2017.  Chest radiograph 11/25/2016 FINDINGS: Cardiac silhouette remains upper limits of normal. There continues to be evidence for bilateral pleural effusions, left side greater than right. Upper lungs are clear without pulmonary edema. Atherosclerotic calcifications at the aortic arch. Trachea is midline. Bridging osteophytes throughout the thoracic spine. Degenerative changes in both shoulders and AC joints. IMPRESSION: Bilateral pleural  effusions, left side greater than right. Left pleural effusion is small-moderate in size. Right pleural effusion is small in size. Minimal change from recent chest CT. Electronically Signed   By: Markus Daft M.D.   On: 03/27/2017 13:37   Ct Chest Wo Contrast  Result Date: 03/20/2017 CLINICAL DATA:  Increasing shortness of breath EXAM: CT CHEST WITHOUT CONTRAST TECHNIQUE: Multidetector CT imaging of the chest was performed following the standard protocol without IV contrast. COMPARISON:  11/19/2016 FINDINGS: Cardiovascular: Moderate cardiac enlargement. There is aortic atherosclerosis noted. Calcification in the LAD coronary artery identified. Mediastinum/Nodes: Normal appearance of the esophagus. The trachea appears patent and is midline. Normal appearance of the esophagus. No mediastinal or hilar adenopathy. No axillary or supraclavicular adenopathy. Lungs/Pleura: Moderate volume bilateral pleural effusions identified. Mild diffuse interlobular septal thickening identified compatible with pulmonary edema. Compressive type atelectasis overlying bilateral effusions are  identified. Upper Abdomen: Ascites noted within the upper abdomen. Previous coli cystectomy. Musculoskeletal: There are marked degenerative changes identified involving the right sternoclavicular joint. Degenerative disc disease identified throughout the thoracic spine. No aggressive lytic or sclerotic bone lesions identified. IMPRESSION: 1. Cardiac enlargement, Aortic Atherosclerosis (ICD10-I70.0), and coronary artery calcification noted. 2. Moderate bilateral pleural effusions and mild interstitial edema consistent with CHF. 3. Ascites Electronically Signed   By: Kerby Moors M.D.   On: 03/20/2017 15:07    Microbiology: No results found for this or any previous visit (from the past 240 hour(s)).   Labs: Basic Metabolic Panel: Recent Labs  Lab 03/27/17 1215 03/27/17 1429 03/28/17 0203 03/28/17 1437 03/29/17 0517 03/30/17 0331  NA  142  --  143  --  143 143  K 4.2  --  3.4*  --  3.3* 3.4*  CL 106  --  104  --  102 102  CO2 29  --  29  --  29 31  GLUCOSE 118*  --  100*  --  101* 105*  BUN 26*  --  24*  --  25* 27*  CREATININE 1.72* 1.68* 1.64*  --  1.67* 1.51*  CALCIUM 8.9  --  8.8*  --  8.8* 8.7*  MG  --   --   --  1.6*  --   --    Liver Function Tests: No results for input(s): AST, ALT, ALKPHOS, BILITOT, PROT, ALBUMIN in the last 168 hours. No results for input(s): LIPASE, AMYLASE in the last 168 hours. No results for input(s): AMMONIA in the last 168 hours. CBC: Recent Labs  Lab 03/27/17 1215 03/30/17 0331  WBC 7.5 5.7  HGB 11.7* 11.0*  HCT 38.8* 35.3*  MCV 85.3 83.5  PLT 217 189   Cardiac Enzymes: Recent Labs  Lab 03/27/17 1429 03/27/17 2007 03/28/17 0203  TROPONINI 0.19* 0.20* 0.22*   BNP: BNP (last 3 results) Recent Labs    11/19/16 1326 03/27/17 1215  BNP 439.5* 489.5*    ProBNP (last 3 results) No results for input(s): PROBNP in the last 8760 hours.  CBG: Recent Labs  Lab 03/29/17 0735 03/29/17 1139 03/29/17 1630 03/29/17 2128 03/30/17 0725  GLUCAP 92 97 107* 122* 99

## 2017-03-30 NOTE — Plan of Care (Signed)
  Clinical Measurements: Ability to maintain clinical measurements within normal limits will improve 03/30/2017 0124 - Completed/Met by Evert Kohl, RN   Health Behavior/Discharge Planning: Ability to manage health-related needs will improve 03/30/2017 0124 - Completed/Met by Garnett-Mellinger, Sophronia Simas, RN

## 2017-03-30 NOTE — Discharge Instructions (Signed)
Furosemide tablets °What is this medicine? °FUROSEMIDE (fyoor [place] se mide) is a diuretic. It helps you make more urine and to lose salt and excess water from your body. This medicine is used to treat high blood pressure, and edema or swelling from heart, kidney, or liver disease. °This medicine may be used for other purposes; ask your health care provider or pharmacist if you have questions. °COMMON BRAND NAME(S): Active-Medicated Specimen Kit, Delone, Diuscreen, Lasix, RX Specimen Collection Kit, Specimen Collection Kit, URINX Medicated Specimen Collection °What should I tell my health care provider before I take this medicine? °They need to know if you have any of these conditions: °-abnormal blood electrolytes °-diarrhea or vomiting °-gout °-heart disease °-kidney disease, small amounts of urine, or difficulty passing urine °-liver disease °-thyroid disease °-an unusual or allergic reaction to furosemide, sulfa drugs, other medicines, foods, dyes, or preservatives °-pregnant or trying to get pregnant °-breast-feeding °How should I use this medicine? °Take this medicine by mouth with a glass of water. Follow the directions on the prescription label. You may take this medicine with or without food. If it upsets your stomach, take it with food or milk. Do not take your medicine more often than directed. Remember that you will need to pass more urine after taking this medicine. Do not take your medicine at a time of day that will cause you problems. Do not take at bedtime. °Talk to your pediatrician regarding the use of this medicine in children. While this drug may be prescribed for selected conditions, precautions do apply. °Overdosage: If you think you have taken too much of this medicine contact a poison control center or emergency room at once. °NOTE: This medicine is only for you. Do not share this medicine with others. °What if I miss a dose? °If you miss a dose, take it as soon as you can. If it is almost time  for your next dose, take only that dose. Do not take double or extra doses. °What may interact with this medicine? °-aspirin and aspirin-like medicines °-certain antibiotics °-chloral hydrate °-cisplatin °-cyclosporine °-digoxin °-diuretics °-laxatives °-lithium °-medicines for blood pressure °-medicines that relax muscles for surgery °-methotrexate °-NSAIDs, medicines for pain and inflammation like ibuprofen, naproxen, or indomethacin °-phenytoin °-steroid medicines like prednisone or cortisone °-sucralfate °-thyroid hormones °This list may not describe all possible interactions. Give your health care provider a list of all the medicines, herbs, non-prescription drugs, or dietary supplements you use. Also tell them if you smoke, drink alcohol, or use illegal drugs. Some items may interact with your medicine. °What should I watch for while using this medicine? °Visit your doctor or health care professional for regular checks on your progress. Check your blood pressure regularly. Ask your doctor or health care professional what your blood pressure should be, and when you should contact him or her. If you are a diabetic, check your blood sugar as directed. °You may need to be on a special diet while taking this medicine. Check with your doctor. Also, ask how many glasses of fluid you need to drink a day. You must not get dehydrated. °You may get drowsy or dizzy. Do not drive, use machinery, or do anything that needs mental alertness until you know how this drug affects you. Do not stand or sit up quickly, especially if you are an older patient. This reduces the risk of dizzy or fainting spells. Alcohol can make you more drowsy and dizzy. Avoid alcoholic drinks. °This medicine can make you more sensitive   to the sun. Keep out of the sun. If you cannot avoid being in the sun, wear protective clothing and use sunscreen. Do not use sun lamps or tanning beds/booths. What side effects may I notice from receiving this  medicine? Side effects that you should report to your doctor or health care professional as soon as possible: -blood in urine or stools -dry mouth -fever or chills -hearing loss or ringing in the ears -irregular heartbeat -muscle pain or weakness, cramps -skin rash -stomach upset, pain, or nausea -tingling or numbness in the hands or feet -unusually weak or tired -vomiting or diarrhea -yellowing of the eyes or skin Side effects that usually do not require medical attention (report to your doctor or health care professional if they continue or are bothersome): -headache -loss of appetite -unusual bleeding or bruising This list may not describe all possible side effects. Call your doctor for medical advice about side effects. You may report side effects to FDA at 1-800-FDA-1088. Where should I keep my medicine? Keep out of the reach of children. Store at room temperature between 15 and 30 degrees C (59 and 86 degrees F). Protect from light. Throw away any unused medicine after the expiration date. NOTE: This sheet is a summary. It may not cover all possible information. If you have questions about this medicine, talk to your doctor, pharmacist, or health care provider.  2018 [employer]vier/Gold Standard (2014-06-22 13:49:50)

## 2017-03-30 NOTE — Progress Notes (Signed)
Pt got discharged to home, discharge instructions provided and patient showed understanding to it, IV taken out,Telemonitor DC,pt left unit in wheelchair with all of the belongings accompanied with a family member (Daughter)

## 2017-03-30 NOTE — Progress Notes (Signed)
Progress Note  Patient Name: Dillon Andrews Date of Encounter: 03/30/2017  Primary Cardiologist: [place]   Subjective   Well without major complaints.  Has been walking the halls without issue.  No further shortness of breath.  Inpatient Medications    Scheduled Meds: . aspirin EC  81 mg Oral Daily  . carvedilol  3.125 mg Oral BID WC  . cyanocobalamin  500 mcg Oral QODAY  . furosemide  40 mg Oral BID  . gabapentin  800 mg Oral QHS  . heparin  5,000 Units Subcutaneous Q8H  . insulin aspart  0-5 Units Subcutaneous QHS  . insulin aspart  0-9 Units Subcutaneous TID WC  . pantoprazole  40 mg Oral Daily  . potassium chloride SA  20 mEq Oral Daily  . rosuvastatin  10 mg Oral Daily  . sodium chloride flush  3 mL Intravenous Q12H   Continuous Infusions: . sodium chloride     PRN Meds: sodium chloride, acetaminophen, clonazePAM, ondansetron (ZOFRAN) IV, sodium chloride flush   Vital Signs    Vitals:   03/29/17 1211 03/29/17 1652 03/29/17 2024 03/30/17 0512  BP: (!) 99/59 100/60 116/69 125/83  Pulse: 72 70 72 73  Resp: 20  20 20   Temp: 98.1 F (36.7 C)  98.2 F (36.8 C) 97.8 F (36.6 C)  TempSrc: Oral  Oral Oral  SpO2: 96%  95% 96%  Weight:    179 lb 9.6 oz (81.5 kg)  Height:        Intake/Output Summary (Last 24 hours) at 03/30/2017 0911 Last data filed at 03/30/2017 0518 Gross per 24 hour  Intake 720 ml  Output 2700 ml  Net -1980 ml   Filed Weights   03/28/17 0457 03/29/17 0600 03/30/17 0512  Weight: 192 lb 3.2 oz (87.2 kg) 188 lb 4.8 oz (85.4 kg) 179 lb 9.6 oz (81.5 kg)    Telemetry    Sinus rhythm, 2 episodes of nonsustained VT- Personally Reviewed  ECG    None new- Personally Reviewed  Physical Exam   GEN: Well nourished, well developed, in no acute distress  HEENT: normal  Neck: no JVD, carotid bruits, or masses Cardiac: RRR; no murmurs, rubs, or gallops, 1+ edema  Respiratory:  clear to auscultation bilaterally, normal work of  breathing GI: soft, nontender, nondistended, + BS MS: no deformity or atrophy  Skin: warm and dry,  Neuro:  Strength and sensation are intact Psych: euthymic mood, full affect    Labs    Chemistry Recent Labs  Lab 03/28/17 0203 03/29/17 0517 03/30/17 0331  NA 143 143 143  K 3.4* 3.3* 3.4*  CL 104 102 102  CO2 29 29 31   GLUCOSE 100* 101* 105*  BUN 24* 25* 27*  CREATININE 1.64* 1.67* 1.51*  CALCIUM 8.8* 8.8* 8.7*  GFRNONAA 39* 38* 43*  GFRAA 45* 44* 50*  ANIONGAP 10 12 10      Hematology Recent Labs  Lab 03/27/17 1215 03/30/17 0331  WBC 7.5 5.7  RBC 4.55 4.23  HGB 11.7* 11.0*  HCT 38.8* 35.3*  MCV 85.3 83.5  MCH 25.7* 26.0  MCHC 30.2 31.2  RDW 19.9* 19.8*  PLT 217 189    Cardiac Enzymes Recent Labs  Lab 03/27/17 1429 03/27/17 2007 03/28/17 0203  TROPONINI 0.19* 0.20* 0.22*    Recent Labs  Lab 03/27/17 1226  TROPIPOC 0.17*     BNP Recent Labs  Lab 03/27/17 1215  BNP 489.5*     DDimer No results for input(s): DDIMER in  the last 168 hours.   Radiology    No results found.  Cardiac Studies   TTE 03/28/17 - Left ventricle: The cavity size was normal. There was mild   concentric hypertrophy. Systolic function was mildly to   moderately reduced. The estimated ejection fraction was in the   range of 40% to 45%. Diffuse hypokinesis. Features are consistent   with a pseudonormal left ventricular filling pattern, with   concomitant abnormal relaxation and increased filling pressure   (grade 2 diastolic dysfunction). - Ventricular septum: Septal motion showed abnormal function and   dyssynergy. - Aortic valve: Trileaflet; mildly thickened, mildly calcified   leaflets. - Mitral valve: There was mild regurgitation. - Left atrium: The atrium was mildly dilated. Volume/bsa, ES,   (1-plane Simpson&'s, A2C): 36.1 ml/m^2. - Right ventricle: The cavity size was mildly dilated. Wall   thickness was normal. - Right atrium: The atrium was severely  dilated. - Tricuspid valve: There was moderate regurgitation. - Pulmonary arteries: Systolic pressure was mildly increased. PA   peak pressure: 40 mm Hg (S). - Pericardium, extracardiac: There was a left pleural effusion.  Patient Profile     76 y.o. male systolic heart failure, nonischemic cardiomyopathy, [name] with esophageal varices, hypertension, diabetes type 2, PVCs, hyperlipidemia stage III CKD.  He presented to the hospital with volume overload and heart failure exacerbation.  Assessment & Plan    1.  Acute on chronic combined CHF Volume status stabilized EF 40-45%.  Currently on Lasix 40 mg orally.  Would continue as an outpatient.    2. Elevated troponin Due to heart failure exacerbation  3. CKD stage III Creatinine stabilized and improving throughout this hospitalization.  4. Hx of NSVT Discontinued amiodarone due to intolerances.  Will increase carvedilol today.  5. HLD Continue Crestor  Cardiology to sign off at this time.  Please call back if there are further issues.  For questions or updates, please contact [place] Please consult [email or web address] for contact info under Cardiology/STEMI.      Signed, Will Meredith Leeds, MD  03/30/2017, 9:11 AM

## 2017-04-01 ENCOUNTER — Telehealth: Payer: Self-pay | Admitting: Behavioral Health

## 2017-04-01 ENCOUNTER — Other Ambulatory Visit: Payer: Medicare HMO

## 2017-04-01 NOTE — Telephone Encounter (Signed)
Transition Care Management Follow-up Telephone Call  PCP: [name], MD  Admit date: 03/27/2017 Discharge date: 03/30/2017  Recommendations for Outpatient Follow-up:  1. Continue lasix 40 mg BID and potassium daily supplementation   Discharge Diagnoses:  Principal Problem:   Acute on chronic systolic heart failure (HCC) Active Problems:   CKD (chronic kidney disease) stage 3, GFR 30-59 ml/min (HCC)   HTN (hypertension)   DM2 (diabetes mellitus, type 2) (HCC)   Bilateral pleural effusion   Elevated troponin    Discharge Condition: stable    How have you been since you were released from the hospital? Patient stated, "I feel good, this is the best I've felt since I can't tell you when".   Do you understand why you were in the hospital? yes   Do you understand the discharge instructions? yes   Where were you discharged to? Home with spouse.   Items Reviewed:  Medications reviewed: yes  Allergies reviewed: yes, NKA  Dietary changes reviewed: yes, low sodium diet  Referrals reviewed: yes, follow-up with PCP; also patient voiced that he's to schedule an appointment with Dr. Ellyn Hack, Cardiologist.   Functional Questionnaire:   Activities of Daily Living (ADLs):   He states they are independent in the following: ambulation, bathing and hygiene, feeding, continence, grooming, toileting and dressing States they require assistance with the following: None   Any transportation issues/concerns?: no   Any patient concerns? yes, patient stated that he would like to speak with his PCP regarding Klonopin medication; no other concerns at this time.   Confirmed importance and date/time of follow-up visits scheduled yes, 04/03/17 at 1:00 PM.  Provider Appointment booked with Dr. Ethelene Hal.  Confirmed with patient if condition begins to worsen call PCP or go to the ER.  Patient was given the office number and encouraged to call back with question or  concerns.  : yes

## 2017-04-03 ENCOUNTER — Ambulatory Visit: Payer: Medicare HMO | Admitting: Family Medicine

## 2017-04-03 ENCOUNTER — Encounter: Payer: Self-pay | Admitting: Family Medicine

## 2017-04-03 VITALS — BP 106/70 | HR 96 | Temp 97.6°F | Ht 71.0 in | Wt 189.0 lb

## 2017-04-03 DIAGNOSIS — Z09 Encounter for follow-up examination after completed treatment for conditions other than malignant neoplasm: Secondary | ICD-10-CM

## 2017-04-03 DIAGNOSIS — G4701 Insomnia due to medical condition: Secondary | ICD-10-CM | POA: Diagnosis not present

## 2017-04-03 DIAGNOSIS — F439 Reaction to severe stress, unspecified: Secondary | ICD-10-CM

## 2017-04-03 MED ORDER — PAROXETINE HCL 10 MG PO TABS: 10.0000 mg | ORAL_TABLET | Freq: Every day | ORAL | 1 refills | Status: DC

## 2017-04-03 MED ORDER — CLONAZEPAM 1 MG PO TABS: 1.0000 mg | ORAL_TABLET | Freq: Every day | ORAL | 0 refills | Status: DC

## 2017-04-03 NOTE — Progress Notes (Addendum)
Subjective:  Patient ID: Dillon Andrews, male    DOB: 12/21/1940  Age: 76 y.o. MRN: [id number]  CC: Hospitalization Follow-up   HPI Dillon Andrews presents for hospital discharge follow-up status post hospitalization for CHF with pleural effusions.  He was discharged 4 days ago.  He lost 10 pounds while in the hospital status post aggressive diuresis with Lasix.  His Coreg was reduced to 6.25 mg daily.  Lasix was increased to 40 mg twice daily with 20 mEq of potassium chloride daily.  He feels much better on this new regimen.  He has no chest pain and his shortness of breath is much improved.  Exercise tolerance has increased.  As expected there is decreased swelling in his legs.  He is sleeping on his wedge with one pillow.  This is his usual sleeping arrangement.  He denies frank depression but says that he had gotten a little down when it became difficult for him to carry wood to his fireplace.  He is to follow-up with cardiology at the end of January.  Outpatient Medications Prior to Visit  Medication Sig Dispense Refill  . aspirin EC 81 MG tablet Take 81 mg by mouth daily.    . carvedilol (COREG) 6.25 MG tablet Take 1 tablet (6.25 mg total) by mouth 2 (two) times daily with a meal. 60 tablet 0  . furosemide (LASIX) 40 MG tablet Take 1 tablet (40 mg total) by mouth 2 (two) times daily. 60 tablet 0  . gabapentin (NEURONTIN) 800 MG tablet Take 1 tablet (800 mg total) by mouth at bedtime. 90 tablet 1  . glipiZIDE (GLUCOTROL) 5 MG tablet Take 2.5 mg by mouth 2 (two) times daily.  0  . glucose blood test strip Use as instructed 100 each 12  . omeprazole (PRILOSEC) 40 MG capsule Take 40 mg by mouth daily.    . potassium chloride SA (K-DUR,KLOR-CON) 20 MEQ tablet Take 20 mEq by mouth daily.    . rosuvastatin (CRESTOR) 10 MG tablet Take 10 mg by mouth daily.    . vitamin B-12 (CYANOCOBALAMIN) 500 MCG tablet Take 500 mcg by mouth every other day.    . clonazePAM (KLONOPIN) 1 MG tablet Take 1  tablet (1 mg total) by mouth at bedtime. Only as needed. 30 tablet 0   No facility-administered medications prior to visit.     ROS Review of Systems  Constitutional: Negative for chills, fatigue, fever and unexpected weight change.  HENT: Negative.   Eyes: Negative.   Respiratory: Negative for chest tightness and shortness of breath.   Cardiovascular: Negative for chest pain, palpitations and leg swelling.  Gastrointestinal: Negative for abdominal distention and abdominal pain.  Genitourinary: Negative for decreased urine volume, difficulty urinating and urgency.  Musculoskeletal: Negative for gait problem.  Skin: Negative for color change and rash.  Psychiatric/Behavioral: Positive for sleep disturbance. Negative for confusion, decreased concentration, dysphoric mood, self-injury and suicidal ideas. The patient is not nervous/anxious.     Objective:  BP 106/70 (BP Location: Left Arm, Patient Position: Sitting, Cuff Size: Normal)   Pulse 96   Temp 97.6 F (36.4 C) (Oral)   Ht 5' 11"  (1.803 m)   Wt 189 lb (85.7 kg)   SpO2 96%   BMI 26.36 kg/m   BP Readings from Last 3 Encounters:  04/03/17 106/70  03/30/17 120/80  03/17/17 116/62    Wt Readings from Last 3 Encounters:  04/03/17 189 lb (85.7 kg)  03/30/17 179 lb 9.6 oz (81.5 kg)  03/17/17 199 lb 9.6 oz (90.5 kg)    Physical Exam  Constitutional: He is oriented to person, place, and time. He appears well-developed and well-nourished. No distress.  HENT:  Head: Normocephalic and atraumatic.  Right Ear: External ear normal.  Left Ear: External ear normal.  Mouth/Throat: Oropharynx is clear and moist.  Eyes: Right eye exhibits no discharge. Left eye exhibits no discharge. No scleral icterus.  Neck: Neck supple. No JVD present. No tracheal deviation present.  Cardiovascular: Normal rate, regular rhythm and normal heart sounds. Exam reveals no gallop.  Pulmonary/Chest: Effort normal and breath sounds normal. No stridor.  No respiratory distress. He has no wheezes. He has no rales.  Abdominal: Bowel sounds are normal.  Musculoskeletal:       Left lower leg: He exhibits edema (trace bilaterally).  Neurological: He is oriented to person, place, and time.  Skin: Skin is warm and dry. He is not diaphoretic.  Psychiatric: He has a normal mood and affect. His behavior is normal.   Depression screen [facility] 2/9 04/03/2017 03/05/2017  Decreased Interest 0 0  Down, Depressed, Hopeless 2 0  PHQ - 2 Score 2 0  Altered sleeping 3 -  Tired, decreased energy 2 -  Change in appetite 0 -  Feeling bad or failure about yourself  0 -  Trouble concentrating 0 -  Moving slowly or fidgety/restless 0 -  Suicidal thoughts 0 -  PHQ-9 Score 7 -     Lab Results  Component Value Date   WBC 5.7 03/30/2017   HGB 11.0 (L) 03/30/2017   HCT 35.3 (L) 03/30/2017   PLT 189 03/30/2017   GLUCOSE 105 (H) 03/30/2017   ALT 51 11/27/2016   AST 57 (H) 11/27/2016   NA 143 03/30/2017   K 3.4 (L) 03/30/2017   CL 102 03/30/2017   CREATININE 1.51 (H) 03/30/2017   BUN 27 (H) 03/30/2017   CO2 31 03/30/2017   INR 1.28 11/22/2016   HGBA1C 6.9 (H) 03/27/2017    Dg Chest 2 View  Result Date: 03/27/2017 CLINICAL DATA:  Midline lower chest tightness.  Shortness of breath. EXAM: CHEST  2 VIEW COMPARISON:  Chest CT 03/20/2017.  Chest radiograph 11/25/2016 FINDINGS: Cardiac silhouette remains upper limits of normal. There continues to be evidence for bilateral pleural effusions, left side greater than right. Upper lungs are clear without pulmonary edema. Atherosclerotic calcifications at the aortic arch. Trachea is midline. Bridging osteophytes throughout the thoracic spine. Degenerative changes in both shoulders and AC joints. IMPRESSION: Bilateral pleural effusions, left side greater than right. Left pleural effusion is small-moderate in size. Right pleural effusion is small in size. Minimal change from recent chest CT. Electronically Signed   By:  Dillon Andrews M.D.   On: 03/27/2017 13:37    Assessment & Plan:   Dillon Andrews was seen today for hospitalization follow-up.  Diagnoses and all orders for this visit:  Insomnia due to medical condition -     clonazePAM (KLONOPIN) 1 MG tablet; Take 1 tablet (1 mg total) by mouth at bedtime. Only as needed. -     PARoxetine (PAXIL) 10 MG tablet; Take 1 tablet (10 mg total) by mouth at bedtime.  Stress -     PARoxetine (PAXIL) 10 MG tablet; Take 1 tablet (10 mg total) by mouth at bedtime.  Hospital discharge follow-up -     Basic metabolic panel  We discussed salt restriction.  Reminding him that salt is often found in the care of the foods, fast foods  and snack foods.  He does not add salt to his foods.  He will take the Paxil in the evenings.  He will take the Klonopin with his Paxil nightly for the first 2 weeks and then back off to as needed.  We will try to wean him away from the Klonopin using the Paxil.  Follow-up in 1 month.  He will return for a BMP 2 or 3 days prior to seeing me in a month. I am having [name] start on PARoxetine. I am also having him maintain his aspirin EC, omeprazole, vitamin B-12, glipiZIDE, potassium chloride SA, rosuvastatin, gabapentin, glucose blood, carvedilol, furosemide, and clonazePAM.  Meds ordered this encounter  Medications  . clonazePAM (KLONOPIN) 1 MG tablet    Sig: Take 1 tablet (1 mg total) by mouth at bedtime. Only as needed.    Dispense:  30 tablet    Refill:  0  . PARoxetine (PAXIL) 10 MG tablet    Sig: Take 1 tablet (10 mg total) by mouth at bedtime.    Dispense:  30 tablet    Refill:  1     Follow-up: Return in about 1 month (around 05/04/2017).  [name], MD

## 2017-04-04 ENCOUNTER — Telehealth: Payer: Self-pay

## 2017-04-04 NOTE — Telephone Encounter (Signed)
Received PA request for Paroxetine 10 mg tablet. PA has been submitted via covermymeds to [facility]. Awaiting decision. Key: Sydnee [id number]

## 2017-04-07 NOTE — Telephone Encounter (Signed)
PA approved, form faxed back to pharmacy. 

## 2017-04-16 DIAGNOSIS — I509 Heart failure, unspecified: Secondary | ICD-10-CM | POA: Insufficient documentation

## 2017-04-16 DIAGNOSIS — E119 Type 2 diabetes mellitus without complications: Secondary | ICD-10-CM | POA: Insufficient documentation

## 2017-04-16 DIAGNOSIS — N183 Chronic kidney disease, stage 3 unspecified: Secondary | ICD-10-CM | POA: Insufficient documentation

## 2017-04-29 ENCOUNTER — Other Ambulatory Visit: Payer: Self-pay | Admitting: Cardiology

## 2017-04-29 MED ORDER — CARVEDILOL 6.25 MG PO TABS: 6.2500 mg | ORAL_TABLET | Freq: Two times a day (BID) | ORAL | 0 refills | Status: DC

## 2017-04-29 NOTE — Telephone Encounter (Signed)
Rx(s) sent to pharmacy electronically.  

## 2017-04-29 NOTE — Telephone Encounter (Signed)
°*  STAT* If patient is at the pharmacy, call can be transferred to refill team.   1. Which medications need to be refilled? (please list name of each medication and dose if known) pt needs a new prescription for Carvedilol-received this medicine while he was in the [facility] Pt-Appt on 05-12-17  2. Which pharmacy/location (including street and city if local pharmacy) is medication to be sent to?[facility],[place]. Do they need a 30 day or 90 day supply? 180 and refills

## 2017-05-01 ENCOUNTER — Other Ambulatory Visit (INDEPENDENT_AMBULATORY_CARE_PROVIDER_SITE_OTHER): Payer: Medicare HMO

## 2017-05-01 DIAGNOSIS — Z09 Encounter for follow-up examination after completed treatment for conditions other than malignant neoplasm: Secondary | ICD-10-CM

## 2017-05-01 LAB — BASIC METABOLIC PANEL
BUN: 27 mg/dL — ABNORMAL HIGH (ref 6–23)
CALCIUM: 9.1 mg/dL (ref 8.4–10.5)
CHLORIDE: 102 meq/L (ref 96–112)
CO2: 35 mEq/L — ABNORMAL HIGH (ref 19–32)
CREATININE: 1.49 mg/dL (ref 0.40–1.50)
GFR: 48.52 mL/min — ABNORMAL LOW (ref >60.00)
Glucose, Bld: 130 mg/dL — ABNORMAL HIGH (ref 70–99)
Potassium: 4.3 mEq/L (ref 3.5–5.1)
Sodium: 145 mEq/L (ref 135–145)

## 2017-05-05 ENCOUNTER — Ambulatory Visit (INDEPENDENT_AMBULATORY_CARE_PROVIDER_SITE_OTHER): Payer: Medicare HMO | Admitting: Family Medicine

## 2017-05-05 ENCOUNTER — Encounter: Payer: Self-pay | Admitting: Family Medicine

## 2017-05-05 VITALS — BP 128/78 | HR 68 | Temp 97.5°F | Ht 71.0 in | Wt 185.0 lb

## 2017-05-05 DIAGNOSIS — G4701 Insomnia due to medical condition: Secondary | ICD-10-CM

## 2017-05-05 MED ORDER — MIRTAZAPINE 7.5 MG PO TABS: 7.5000 mg | ORAL_TABLET | Freq: Every day | ORAL | 1 refills | Status: DC

## 2017-05-05 NOTE — Progress Notes (Signed)
Subjective:  Patient ID: Dillon Andrews, male    DOB: 03-07-1941  Age: 77 y.o. MRN: [id number]  CC: Follow-up   HPI [name] presents for follow-up of his insomnia.  He is sleeping somewhat better with the Paxil but is having to continue his melatonin and take a Benadryl as well.  He denies frank depression at this time and has not been able to gain any weight since he left the hospital.  Outpatient Medications Prior to Visit  Medication Sig Dispense Refill  . aspirin EC 81 MG tablet Take 81 mg by mouth daily.    . carvedilol (COREG) 6.25 MG tablet Take 1 tablet (6.25 mg total) by mouth 2 (two) times daily with a meal. 180 tablet 0  . furosemide (LASIX) 40 MG tablet Take 1 tablet (40 mg total) by mouth 2 (two) times daily. 60 tablet 0  . gabapentin (NEURONTIN) 800 MG tablet Take 1 tablet (800 mg total) by mouth at bedtime. 90 tablet 1  . glipiZIDE (GLUCOTROL) 5 MG tablet Take 2.5 mg by mouth 2 (two) times daily.  0  . glucose blood test strip Use as instructed 100 each 12  . omeprazole (PRILOSEC) 40 MG capsule Take 40 mg by mouth daily.    . potassium chloride SA (K-DUR,KLOR-CON) 20 MEQ tablet Take 20 mEq by mouth daily.    . rosuvastatin (CRESTOR) 10 MG tablet Take 10 mg by mouth daily.    . vitamin B-12 (CYANOCOBALAMIN) 500 MCG tablet Take 500 mcg by mouth every other day.    . clonazePAM (KLONOPIN) 1 MG tablet Take 1 tablet (1 mg total) by mouth at bedtime. Only as needed. 30 tablet 0  . PARoxetine (PAXIL) 10 MG tablet Take 1 tablet (10 mg total) by mouth at bedtime. 30 tablet 1   No facility-administered medications prior to visit.     ROS Review of Systems  Constitutional: Negative.   Respiratory: Negative.  Negative for shortness of breath.   Cardiovascular: Negative for chest pain and palpitations.  Gastrointestinal: Negative.  Negative for nausea and vomiting.  Endocrine: Negative for polyphagia and polyuria.  Genitourinary: Negative for difficulty urinating.    Musculoskeletal: Negative for arthralgias and myalgias.  Psychiatric/Behavioral: Positive for sleep disturbance. Negative for dysphoric mood and self-injury. The patient is not nervous/anxious.     Objective:  BP 128/78 (BP Location: Left Arm, Patient Position: Sitting, Cuff Size: Normal)   Pulse 68   Temp (!) 97.5 F (36.4 C) (Oral)   Ht 5' 11"  (1.803 m)   Wt 185 lb (83.9 kg)   SpO2 97%   BMI 25.80 kg/m   BP Readings from Last 3 Encounters:  05/05/17 128/78  04/03/17 106/70  03/30/17 120/80    Wt Readings from Last 3 Encounters:  05/05/17 185 lb (83.9 kg)  04/03/17 189 lb (85.7 kg)  03/30/17 179 lb 9.6 oz (81.5 kg)    Physical Exam  Constitutional: He is oriented to person, place, and time. He appears well-developed and well-nourished. No distress.  HENT:  Head: Normocephalic and atraumatic.  Left Ear: External ear normal.  Eyes: Right eye exhibits no discharge. Left eye exhibits no discharge. No scleral icterus.  Neck: No JVD present. No tracheal deviation present.  Pulmonary/Chest: Effort normal. No stridor.  Neurological: He is alert and oriented to person, place, and time.  Skin: Skin is warm and dry. He is not diaphoretic.  Psychiatric: He has a normal mood and affect. His behavior is normal.  Lab Results  Component Value Date   WBC 5.7 03/30/2017   HGB 11.0 (L) 03/30/2017   HCT 35.3 (L) 03/30/2017   PLT 189 03/30/2017   GLUCOSE 130 (H) 05/01/2017   ALT 51 11/27/2016   AST 57 (H) 11/27/2016   NA 145 05/01/2017   K 4.3 05/01/2017   CL 102 05/01/2017   CREATININE 1.49 05/01/2017   BUN 27 (H) 05/01/2017   CO2 35 (H) 05/01/2017   INR 1.28 11/22/2016   HGBA1C 6.9 (H) 03/27/2017    Dg Chest 2 View  Result Date: 03/27/2017 CLINICAL DATA:  Midline lower chest tightness.  Shortness of breath. EXAM: CHEST  2 VIEW COMPARISON:  Chest CT 03/20/2017.  Chest radiograph 11/25/2016 FINDINGS: Cardiac silhouette remains upper limits of normal. There continues to  be evidence for bilateral pleural effusions, left side greater than right. Upper lungs are clear without pulmonary edema. Atherosclerotic calcifications at the aortic arch. Trachea is midline. Bridging osteophytes throughout the thoracic spine. Degenerative changes in both shoulders and AC joints. IMPRESSION: Bilateral pleural effusions, left side greater than right. Left pleural effusion is small-moderate in size. Right pleural effusion is small in size. Minimal change from recent chest CT. Electronically Signed   By: Markus Daft M.D.   On: 03/27/2017 13:37    Assessment & Plan:   Dillon Andrews was seen today for follow-up.  Diagnoses and all orders for this visit:  Insomnia due to medical condition -     mirtazapine (REMERON) 7.5 MG tablet; Take 1 tablet (7.5 mg total) by mouth at bedtime.   I have discontinued Rudi Heap. Will's clonazePAM and PARoxetine. I am also having him start on mirtazapine. Additionally, I am having him maintain his aspirin EC, omeprazole, vitamin B-12, glipiZIDE, potassium chloride SA, rosuvastatin, gabapentin, glucose blood, furosemide, and carvedilol.  Meds ordered this encounter  Medications  . mirtazapine (REMERON) 7.5 MG tablet    Sig: Take 1 tablet (7.5 mg total) by mouth at bedtime.    Dispense:  30 tablet    Refill:  1   I have discontinued the Paxil and the Benadryl.  He will start the Remeron tonight.  I warned him of the head did have a heavy daytime somnolence after first starting the Remeron for 1-3 days.  Would actually like to see him gain some weight taking this drug.  I do believe that it will be effective for him.  He will follow-up in 1 month. Follow-up: Return in about 1 month (around 06/05/2017).  [name], MD

## 2017-05-05 NOTE — Patient Instructions (Addendum)
Mirtazapine tablets What is this medicine? MIRTAZAPINE (mir TAZ a peen) is used to treat depression. This medicine may be used for other purposes; ask your health care provider or pharmacist if you have questions. COMMON BRAND NAME(S): Remeron What should I tell my health care provider before I take this medicine? They need to know if you have any of these conditions: -bipolar disorder -glaucoma -kidney disease -liver disease -suicidal thoughts -an unusual or allergic reaction to mirtazapine, other medicines, foods, dyes, or preservatives -pregnant or trying to get pregnant -breast-feeding How should I use this medicine? Take this medicine by mouth with a glass of water. Follow the directions on the prescription label. Take your medicine at regular intervals. Do not take your medicine more often than directed. Do not stop taking this medicine suddenly except upon the advice of your doctor. Stopping this medicine too quickly may cause serious side effects or your condition may worsen. A special MedGuide will be given to you by the pharmacist with each prescription and refill. Be sure to read this information carefully each time. Talk to your pediatrician regarding the use of this medicine in children. Special care may be needed. Overdosage: If you think you have taken too much of this medicine contact a poison control center or emergency room at once. NOTE: This medicine is only for you. Do not share this medicine with others. What if I miss a dose? If you miss a dose, take it as soon as you can. If it is almost time for your next dose, take only that dose. Do not take double or extra doses. What may interact with this medicine? Do not take this medicine with any of the following medications: -linezolid -MAOIs like Carbex, Eldepryl, Marplan, Nardil, and Parnate -methylene blue (injected into a vein) This medicine may also interact with the following medications: -alcohol -antiviral  medicines for HIV or AIDS -certain medicines that treat or prevent blood clots like warfarin -certain medicines for depression, anxiety, or psychotic disturbances -certain medicines for fungal infections like ketoconazole and itraconazole -certain medicines for migraine headache like almotriptan, eletriptan, frovatriptan, naratriptan, rizatriptan, sumatriptan, zolmitriptan -certain medicines for seizures like carbamazepine or phenytoin -certain medicines for sleep -cimetidine -erythromycin -fentanyl -lithium -medicines for blood pressure -nefazodone -rasagiline -rifampin -supplements like St. John's wort, kava kava, valerian -tramadol -tryptophan This list may not describe all possible interactions. Give your health care provider a list of all the medicines, herbs, non-prescription drugs, or dietary supplements you use. Also tell them if you smoke, drink alcohol, or use illegal drugs. Some items may interact with your medicine. What should I watch for while using this medicine? Tell your doctor if your symptoms do not get better or if they get worse. Visit your doctor or health care professional for regular checks on your progress. Because it may take several weeks to see the full effects of this medicine, it is important to continue your treatment as prescribed by your doctor. Patients and their families should watch out for new or worsening thoughts of suicide or depression. Also watch out for sudden changes in feelings such as feeling anxious, agitated, panicky, irritable, hostile, aggressive, impulsive, severely restless, overly excited and hyperactive, or not being able to sleep. If this happens, especially at the beginning of treatment or after a change in dose, call your health care professional. You may get drowsy or dizzy. Do not drive, use machinery, or do anything that needs mental alertness until you know how this medicine affects you. Do not   stand or sit up quickly, especially if  you are an older patient. This reduces the risk of dizzy or fainting spells. Alcohol may interfere with the effect of this medicine. Avoid alcoholic drinks. This medicine may cause dry eyes and blurred vision. If you wear contact lenses you may feel some discomfort. Lubricating drops may help. See your eye doctor if the problem does not go away or is severe. Your mouth may get dry. Chewing sugarless gum or sucking hard candy, and drinking plenty of water may help. Contact your doctor if the problem does not go away or is severe. What side effects may I notice from receiving this medicine? Side effects that you should report to your doctor or health care professional as soon as possible: -allergic reactions like skin rash, itching or hives, swelling of the face, lips, or tongue -anxious -changes in vision -chest pain -confusion -elevated mood, decreased need for sleep, racing thoughts, impulsive behavior -eye pain -fast, irregular heartbeat -feeling faint or lightheaded, falls -feeling agitated, angry, or irritable -fever or chills, sore throat -hallucination, loss of contact with reality -loss of balance or coordination -mouth sores -redness, blistering, peeling or loosening of the skin, including inside the mouth -restlessness, pacing, inability to keep still -seizures -stiff muscles -suicidal thoughts or other mood changes -trouble passing urine or change in the amount of urine -trouble sleeping -unusual bleeding or bruising -unusually weak or tired -vomiting Side effects that usually do not require medical attention (report to your doctor or health care professional if they continue or are bothersome): -change in appetite -constipation -dizziness -dry mouth -muscle aches or pains -nausea -tired -weight gain This list may not describe all possible side effects. Call your doctor for medical advice about side effects. You may report side effects to FDA at 1-800-FDA-1088. Where  should I keep my medicine? Keep out of the reach of children. Store at room temperature between 15 and 30 degrees C (59 and 86 degrees F) Protect from light and moisture. Throw away any unused medicine after the expiration date. NOTE: This sheet is a summary. It may not cover all possible information. If you have questions about this medicine, talk to your doctor, pharmacist, or health care provider.  2018 Elsevier/Gold Standard (2015-08-31 17:30:45)  

## 2017-05-12 ENCOUNTER — Encounter: Payer: Self-pay | Admitting: Cardiology

## 2017-05-12 ENCOUNTER — Ambulatory Visit: Payer: Medicare HMO | Admitting: Cardiology

## 2017-05-12 VITALS — BP 118/72 | HR 79 | Ht 71.0 in | Wt 186.2 lb

## 2017-05-12 DIAGNOSIS — I5042 Chronic combined systolic (congestive) and diastolic (congestive) heart failure: Secondary | ICD-10-CM

## 2017-05-12 DIAGNOSIS — K7460 Unspecified cirrhosis of liver: Secondary | ICD-10-CM

## 2017-05-12 DIAGNOSIS — J90 Pleural effusion, not elsewhere classified: Secondary | ICD-10-CM

## 2017-05-12 DIAGNOSIS — R778 Other specified abnormalities of plasma proteins: Secondary | ICD-10-CM

## 2017-05-12 DIAGNOSIS — Z79899 Other long term (current) drug therapy: Secondary | ICD-10-CM

## 2017-05-12 DIAGNOSIS — I2729 Other secondary pulmonary hypertension: Secondary | ICD-10-CM

## 2017-05-12 DIAGNOSIS — N183 Chronic kidney disease, stage 3 unspecified: Secondary | ICD-10-CM

## 2017-05-12 DIAGNOSIS — I340 Nonrheumatic mitral (valve) insufficiency: Secondary | ICD-10-CM | POA: Diagnosis not present

## 2017-05-12 DIAGNOSIS — I4510 Unspecified right bundle-branch block: Secondary | ICD-10-CM | POA: Insufficient documentation

## 2017-05-12 DIAGNOSIS — I10 Essential (primary) hypertension: Secondary | ICD-10-CM | POA: Diagnosis not present

## 2017-05-12 DIAGNOSIS — I8510 Secondary esophageal varices without bleeding: Secondary | ICD-10-CM

## 2017-05-12 DIAGNOSIS — I428 Other cardiomyopathies: Secondary | ICD-10-CM | POA: Diagnosis not present

## 2017-05-12 DIAGNOSIS — R7989 Other specified abnormal findings of blood chemistry: Secondary | ICD-10-CM

## 2017-05-12 DIAGNOSIS — R748 Abnormal levels of other serum enzymes: Secondary | ICD-10-CM | POA: Diagnosis not present

## 2017-05-12 MED ORDER — FUROSEMIDE 80 MG PO TABS: ORAL_TABLET | ORAL | 3 refills | Status: DC

## 2017-05-12 MED ORDER — SPIRONOLACTONE 25 MG PO TABS
25.0000 mg | ORAL_TABLET | Freq: Every day | ORAL | 3 refills | Status: DC
Start: 2017-05-12 — End: 2017-06-29

## 2017-05-12 NOTE — Progress Notes (Signed)
PCP: Dillon Maw, MD  Clinic Note: Chief Complaint  Patient presents with  . New Patient (Initial Visit)    Cardiomyopathy/heart failure  . Shortness of Breath    pt states when bending and walking sometimes, also states when he is lying down to rest     HPI: Dillon Andrews is a 77 y.o. male with a PMH below who presents today for hospital follow-up for acute on chronic combined heart failure.  He is referred at the request of Dillon Andrews,* & Dr. Chase Caller. He has known cardiomyopathy (nonischemic by heart catheterization) with EF somewhere between 35-45%. Systolic dysfunction. Has elevated pulmonary pressures with moderate severe TR. He has a history of nonsustained VT, intolerant of beta blocker and amiodarone.  Notably: Prior to his hospitalization last summer when he was found to have cardiomyopathy, he was able to play 18 holes of golf and had no symptoms of shortness of breath or fatigue.  He is never had any prolonged episodes of chest discomfort.   He was previously followed by Dr. Marijo File  For Cardiology. (Dr. Clydie Braun,) -- was enrolled in cardiac rehabilitation. -->  He was seeking to transfer care to [facility], and has requested to follow-up with me based on the recommendation of his daughter who is a patient of mine. Followed by Dr. Chase Caller for Pulmonary issues.  Dillon Andrews was last seen on March 26, 2017 by Dr. Clydie Braun --> plan was to enroll in cardiac rehab.  He appeared to be euvolemic at that time.  Noted that nausea was improved after cholecystectomy.  No further's no further arrhythmias noted on monitor.  The amiodarone was discontinued.     Recent Hospitalizations: December 13-16, 2018 -- acute on chronic combined systolic and diastolic heart failure.  (Interestingly this was right after his cardiology visit) --Seen by Dr. Percival Spanish in consultation on December 14  Studies Personally Reviewed - (if available, images/films  reviewed: From Epic Chart or Care Everywhere)  2-D echo (TTE) December 2018, Mild concentric LVH. Moderately reduced EF of 40-45%. Diffuse HK with no regional wall motion abnormality. GR 2 DD. Septal dyssynergy. Mild MR, moderate TR. Severe RA dilation..  Cardiac catheterization 09/30/2016: Mild nausea for CAD. EF 40-45%. Right heart cath showed unremarkable right heart pressures except for peak V wave of 19. - Moderate MR by TEE --> referred for TEE.  TTE July 2018: EF 35-40% with moderate HK. Normal RV function. Moderately severe TR.  TEE June 2018: Mild global LV hypokinesis. Moderate MR. Moderate TR.  Interval History: [name] presents here today stating that he is feeling much better after his recent hospitalization with diuresis.  He is finally starting to feel a little bit more back to himself.  He indicates that his weight is up a lot lower than it usually is for him, and is maintaining a somewhat steady value since his discharge.  He still has a little bit of shortness of breath when bending down to tie shoes, indicating that he feels a swelling sensation up underneath his rib cage.  He says his leg edema is pretty well controlled.  He does not really have orthopnea or PND now.  He denies any chest pain or pressure with rest or exertion.  His exertional dyspnea is notably improved since his last hospital stay.  He indicates that his heart failure type symptoms began last May.  He noticed that he was getting very short of breath while walking his usual 18 holes of golf.  He  also noted getting short of breath while walking around his property.  He was notably improved and doing quite well but then after his cholecystectomy get sick again.  He then was just recently admitted for heart failure exacerbation in December.  No palpitations, lightheadedness, dizziness, weakness or syncope/near syncope. No TIA/amaurosis fugax symptoms. No claudication.  ROS: A comprehensive was performed. Review  of Systems  Constitutional: Negative for malaise/fatigue (Gradually improving.  Almost back to baseline).  HENT: Negative for nosebleeds.   Respiratory: Positive for shortness of breath (Still has exertional dyspnea, not back to his baseline before may). Negative for cough.   Gastrointestinal: Positive for abdominal pain (Still has some mild right upper quadrant pain). Negative for blood in stool and melena.       No longer really having the nausea and vomiting  Genitourinary: Negative for hematuria.  Musculoskeletal: Negative for falls, joint pain and myalgias.  Neurological: Negative for dizziness.  Psychiatric/Behavioral: Negative for depression and memory loss. The patient is not nervous/anxious and does not have insomnia.   All other systems reviewed and are negative.   I have reviewed and (if needed) personally updated the patient's problem list, medications, allergies, past medical and surgical history, social and family history.   Past Medical History:  Diagnosis Date  . Anemia 11/2016  . Arthritis    "some; hands/fingers" (03/27/2017)  . Barrett's esophagus   . Basal cell carcinoma    "face, arms, hands" (03/27/2017)  . Cataracts, bilateral   . CHF (congestive heart failure) ([facility])   . Chicken pox   . Cholelithiasis 11/2016   S/P LAP CHOLE  . CKD (chronic kidney disease), stage III ([place])   . Esophageal varices (HCC)    Secondary to NAFLD  . GERD (gastroesophageal reflux disease)   . High cholesterol   . Hypertension   . NAFLD (nonalcoholic fatty liver disease)   . Pneumonia 09/2016  . Type II diabetes mellitus ([place])     Past Surgical History:  Procedure Laterality Date  . BACK SURGERY    . BASAL CELL CARCINOMA EXCISION Right    temple; "had to do skin graft"  . CARDIAC CATHETERIZATION  09/2016   Minimal, non-occlusive CAD, EF 45% [facility])  . CARPAL TUNNEL RELEASE Bilateral   . CATARACT EXTRACTION W/ INTRAOCULAR LENS  IMPLANT, BILATERAL Bilateral     . CHOLECYSTECTOMY N/A 11/22/2016   Procedure: LAPAROSCOPIC CHOLECYSTECTOMY;  Surgeon: Coralie Keens, MD;  Location: [facility];  Service: General;  Laterality: N/A;  . JOINT REPLACEMENT    . LUMBAR DISC SURGERY  X 2  . TONSILLECTOMY    . TOTAL KNEE ARTHROPLASTY Left 2010  . TRANSESOPHAGEAL ECHOCARDIOGRAM  09/2016   Mild global LV hypokinesis. Moderate MR. Moderate TR. [facility])  . TRANSTHORACIC ECHOCARDIOGRAM  10/2016   [facility]) EF 35-40% with moderate HK. Normal RV function. Moderately severe TR.  Marland Kitchen TRANSTHORACIC ECHOCARDIOGRAM  03/2017    [facility]) mild concentric LVH. Moderately reduced EF of 40-45%. Diffuse HK with no regional wall motion abnormality. GR 2 DD. Septal dyssynergy. Mild MR, moderate TR. Severe RA dilation..    Current Meds  Medication Sig  . aspirin EC 81 MG tablet Take 81 mg by mouth daily.  . carvedilol (COREG) 6.25 MG tablet Take 1 tablet (6.25 mg total) by mouth 2 (two) times daily with a meal.  . gabapentin (NEURONTIN) 800 MG tablet Take 1 tablet (800 mg total) by mouth at bedtime.  Marland Kitchen glipiZIDE (GLUCOTROL) 5 MG  tablet Take 2.5 mg by mouth 2 (two) times daily.  Marland Kitchen glucose blood test strip Use as instructed  . mirtazapine (REMERON) 7.5 MG tablet Take 1 tablet (7.5 mg total) by mouth at bedtime.  Marland Kitchen omeprazole (PRILOSEC) 40 MG capsule Take 40 mg by mouth daily.  . potassium chloride SA (K-DUR,KLOR-CON) 20 MEQ tablet Take 20 mEq by mouth daily.  . rosuvastatin (CRESTOR) 10 MG tablet Take 10 mg by mouth daily.  . vitamin B-12 (CYANOCOBALAMIN) 500 MCG tablet Take 500 mcg by mouth every other day.  . [DISCONTINUED] furosemide (LASIX) 40 MG tablet Take 1 tablet (40 mg total) by mouth 2 (two) times daily.    No Known Allergies    Social History   Tobacco Use  . Smoking status: Former Smoker    Packs/day: 0.30    Years: 15.00    Pack years: 4.50    Types: Cigarettes    Last attempt to quit: 03/17/1977    Years since quitting: 40.1  .  Smokeless tobacco: Never Used  Substance Use Topics  . Alcohol use: Yes    Comment: 03/27/2017 "nothing in 6 months; used to have glass of wine q hs"  . Drug use: No   Social History   Social History Narrative   Accompanied by his daughter --> also a patient of Dr. Ellyn Hack    family history includes Breast cancer in his sister; Cancer in his father; Cataracts in his father.  Wt Readings from Last 3 Encounters:  05/12/17 186 lb 3.2 oz (84.5 kg)  05/05/17 185 lb (83.9 kg)  04/03/17 189 lb (85.7 kg)    PHYSICAL EXAM BP 118/72   Pulse 79   Ht 5' 11"  (1.803 m)   Wt 186 lb 3.2 oz (84.5 kg)   BMI 25.97 kg/m  Physical Exam  Constitutional: He is oriented to person, place, and time. He appears well-developed and well-nourished. No distress.  Healthy, euvolemic appearing.  Well-groomed.  HENT:  Head: Normocephalic and atraumatic.  Mouth/Throat: Oropharynx is clear and moist. No oropharyngeal exudate.  Eyes: EOM are normal. Pupils are equal, round, and reactive to light. No scleral icterus.  Neck: Normal range of motion. Neck supple. No hepatojugular reflux and no JVD present. Carotid bruit is not present.  Cardiovascular: Normal rate, regular rhythm and S1 normal.  Occasional extrasystoles are present. PMI is not displaced. Exam reveals distant heart sounds. Exam reveals no gallop and no friction rub.  No murmur heard. Split S2  Pulmonary/Chest: Effort normal.  Bibasilar diminished breath sounds, but no rhonchi.  No wheezing.  Abdominal: Soft. Bowel sounds are normal. He exhibits no distension (Does appear to be some mild abdominal swelling.). There is no tenderness. There is no rebound.  Musculoskeletal: Normal range of motion. He exhibits edema (Trivial).  Neurological: He is alert and oriented to person, place, and time.  Skin: Skin is warm and dry.  Psychiatric: He has a normal mood and affect. His behavior is normal. Judgment and thought content normal.  Nursing note and  vitals reviewed.     Adult ECG Report  Rate: 79 ;  Rhythm: normal sinus rhythm, premature ventricular contractions (PVC) and RBBB/widening QRS, cannot exclude inferior/anterior MI, age undetermined.  (Poor R wave progression).;   Narrative Interpretation: Stable EKG.   Other studies Reviewed: Additional studies/ records that were reviewed today include:  Recent Labs:   Lab Results  Component Value Date   CREATININE 1.49 05/01/2017   BUN 27 (H) 05/01/2017   NA 145 05/01/2017  K 4.3 05/01/2017   CL 102 05/01/2017   CO2 35 (H) 05/01/2017   Lab Results  Component Value Date  CHOL 150 (12/04/2016;  HDL 41 (12/04/2016; LDLCALC 92 (12/04/2016  TRIG 83 (12/04/2016;   ASSESSMENT / PLAN: Problem List Items Addressed This Visit    Bilateral pleural effusion    Significant bilateral pleural effusions with a thoracentesis done this past summer.  I suspect that this was related to his cholecystitis and not necessarily heart failure.  Would be hard to fully diurese this off, but hopefully as we maintain a low dry weight, he will gradually get rid of the effusions.      Chronic combined systolic and diastolic heart failure (HCC) (Chronic)    Currently class I to class II symptoms.  Not yet back to himself. On beta-blocker, but not afterload reducing agent such as ARB at this point.  Need to see how his blood pressure response to the addition of Spironolactone.  Continue Lasix at current dose but consolidate 80 mg daily and sliding scale.  Current dry weight is roughly what it is today since he seems euvolemic on exam.      Relevant Medications   spironolactone (ALDACTONE) 25 MG tablet   furosemide (LASIX) 80 MG tablet   Other Relevant Orders   Basic metabolic panel   CKD (chronic kidney disease) stage 3, GFR 30-59 ml/min (HCC) (Chronic)   Relevant Orders   Basic metabolic panel   Elevated troponin    He has had elevated troponin during his heart failure exacerbations.  Likely  related to his acute on chronic combined systolic and diastolic heart failure.  Normal coronary arteries by cath.  Suspect related to increased left ventricular filling pressures.      Esophageal varices in cirrhosis (HCC) (Chronic)    Potentially related to portal hypertension from heart failure?       Relevant Medications   spironolactone (ALDACTONE) 25 MG tablet   furosemide (LASIX) 80 MG tablet   Essential hypertension (Chronic)    Well-controlled currently on carvedilol.  He does have room to add spironolactone.  Depending on how he feels in follow-up will add ARB for afterload reduction.      Relevant Medications   spironolactone (ALDACTONE) 25 MG tablet   furosemide (LASIX) 80 MG tablet   Mitral regurgitation (Chronic)    Probably at least in some part functional in nature.  Does not seem to be like a structural abnormality. Continue diuretics and blood pressure control.  Add afterload reduction soon.      Relevant Medications   spironolactone (ALDACTONE) 25 MG tablet   furosemide (LASIX) 80 MG tablet   Non-ischemic cardiomyopathy (HCC) - Primary (Chronic)    Moderately reduced EF with diastolic dysfunction and recent hospitalization.  Currently class I-II symptoms and almost euvolemic.  I think he pretty much is a dry weight that he should maintain.  He is hoping to get back up to about 190pounds which would be a more healthy weight for him having lost a lot of weight from illness.  Regardless, I want to try to set a dry weight right now at roughly where it is at present.   Plan:   We talked about sliding scale Lasix.  He can consolidate his 80 mg of Lasix in the once daily dosing which will allow him to use a second afternoon dose on as-needed basis.  (See sliding scale below).    Add spironolactone at 12.5 mg and gradually titrate to 25  mg.  (Check chemistry panel in 1 month) he is currently on  Somewhat decent dose of carvedilol, but not on afterload reducing agents.   Once we see how he does on spironolactone, can likely consider adding ARB.       Relevant Medications   spironolactone (ALDACTONE) 25 MG tablet   furosemide (LASIX) 80 MG tablet   Other Relevant Orders   Basic metabolic panel   Other secondary pulmonary hypertension (HCC) (Chronic)    Significant TR on echo and TEE.  Interestingly, unremarkable right heart pressures on catheterization.  There was simply V wave from MR. -->  Continue diuretics and eventually add afterload reducing agents to minimize MR.      Relevant Medications   spironolactone (ALDACTONE) 25 MG tablet   furosemide (LASIX) 80 MG tablet   Other Relevant Orders   Basic metabolic panel    Other Visit Diagnoses    Medication management       Relevant Orders   Basic metabolic panel      Current medicines are reviewed at length with the patient today. (+/- concerns) does he need to keep taking Lasix as much The following changes have been made:Yes.  See below  Patient Instructions  Medication changes  START SPIROLACTONE  25 MG  - THE  FIRST 2 WEEKS TAKE ONLY 1/2 TABLET DAILY THEN INCREASE TO 1 WHOLE TABLET DAILY   FUROSEMIDE 80 MG  ONE TABLET IN THE MORNING. MAY USE THIS SLIDING SCALE: Sliding scale Lasix: Weigh yourself when you get home, then Daily in the Morning. Your dry weight will be what your scale says on the day you return home.(here is 186  lbs.).   If you gain more than 3 pounds from dry weight: Increase the Lasix dosing to 80 mg in the morning and 40 mg in the afternoon until weight returns to baseline dry weight.  If weight gain is greater than 5 pounds in 2 days: Increased to Lasix 80 mg twice a day and contact the office for further assistance if weight does not go down the next day.  If the weight goes down more than 3 pounds from dry weight: Hold Lasix until it returns to baseline dry weight    PLEASE HAVE LAB WORK FEB 27 0R 28 ,2019 AT THIS OFFICE. DO NOT NEED AN  APPOINTMENT BMP   Your physician recommends that you schedule a follow-up appointment in [place].    Studies Ordered:   Orders Placed This Encounter  Procedures  . Basic metabolic panel      Glenetta Hew, M.D., M.S. Interventional Cardiologist   Pager # [phone] Phone # [phone] [address] [place], [place] [address]   Thank you for choosing [facility] at [facility]!!

## 2017-05-12 NOTE — Patient Instructions (Addendum)
Medication changes  START SPIROLACTONE  25 MG  - THE  FIRST 2 WEEKS TAKE ONLY 1/2 TABLET DAILY THEN INCREASE TO 1 WHOLE TABLET DAILY   FUROSEMIDE 80 MG  ONE TABLET IN THE MORNING. MAY USE THIS SLIDING SCALE: Sliding scale Lasix: Weigh yourself when you get home, then Daily in the Morning. Your dry weight will be what your scale says on the day you return home.(here is 186  lbs.).   If you gain more than 3 pounds from dry weight: Increase the Lasix dosing to 80 mg in the morning and 40 mg in the afternoon until weight returns to baseline dry weight.  If weight gain is greater than 5 pounds in 2 days: Increased to Lasix 80 mg twice a day and contact the office for further assistance if weight does not go down the next day.  If the weight goes down more than 3 pounds from dry weight: Hold Lasix until it returns to baseline dry weight    PLEASE HAVE LAB WORK FEB 27 0R 28 ,2019 AT THIS OFFICE. DO NOT NEED AN APPOINTMENT BMP   Your physician recommends that you schedule a follow-up appointment in [place].

## 2017-05-14 ENCOUNTER — Encounter: Payer: Self-pay | Admitting: Cardiology

## 2017-05-14 NOTE — Assessment & Plan Note (Signed)
Probably at least in some part functional in nature.  Does not seem to be like a structural abnormality. Continue diuretics and blood pressure control.  Add afterload reduction soon.

## 2017-05-14 NOTE — Assessment & Plan Note (Signed)
Well-controlled currently on carvedilol.  He does have room to add spironolactone.  Depending on how he feels in follow-up will add ARB for afterload reduction.

## 2017-05-14 NOTE — Assessment & Plan Note (Signed)
Significant bilateral pleural effusions with a thoracentesis done this past summer.  I suspect that this was related to his cholecystitis and not necessarily heart failure.  Would be hard to fully diurese this off, but hopefully as we maintain a low dry weight, he will gradually get rid of the effusions.

## 2017-05-14 NOTE — Assessment & Plan Note (Signed)
Significant TR on echo and TEE.  Interestingly, unremarkable right heart pressures on catheterization.  There was simply V wave from MR. -->  Continue diuretics and eventually add afterload reducing agents to minimize MR.

## 2017-05-14 NOTE — Assessment & Plan Note (Signed)
Potentially related to portal hypertension from heart failure?

## 2017-05-14 NOTE — Assessment & Plan Note (Signed)
Currently class I to class II symptoms.  Not yet back to himself. On beta-blocker, but not afterload reducing agent such as ARB at this point.  Need to see how his blood pressure response to the addition of Spironolactone.  Continue Lasix at current dose but consolidate 80 mg daily and sliding scale.  Current dry weight is roughly what it is today since he seems euvolemic on exam.

## 2017-05-14 NOTE — Assessment & Plan Note (Signed)
He has had elevated troponin during his heart failure exacerbations.  Likely related to his acute on chronic combined systolic and diastolic heart failure.  Normal coronary arteries by cath.  Suspect related to increased left ventricular filling pressures.

## 2017-05-14 NOTE — Assessment & Plan Note (Signed)
Moderately reduced EF with diastolic dysfunction and recent hospitalization.  Currently class I-II symptoms and almost euvolemic.  I think he pretty much is a dry weight that he should maintain.  He is hoping to get back up to about 190pounds which would be a more healthy weight for him having lost a lot of weight from illness.  Regardless, I want to try to set a dry weight right now at roughly where it is at present.   Plan:   We talked about sliding scale Lasix.  He can consolidate his 80 mg of Lasix in the once daily dosing which will allow him to use a second afternoon dose on as-needed basis.  (See sliding scale below).    Add spironolactone at 12.5 mg and gradually titrate to 25 mg.  (Check chemistry panel in 1 month) he is currently on  Somewhat decent dose of carvedilol, but not on afterload reducing agents.  Once we see how he does on spironolactone, can likely consider adding ARB.

## 2017-06-06 ENCOUNTER — Encounter: Payer: Self-pay | Admitting: Family Medicine

## 2017-06-06 ENCOUNTER — Ambulatory Visit (INDEPENDENT_AMBULATORY_CARE_PROVIDER_SITE_OTHER): Payer: Medicare HMO | Admitting: Family Medicine

## 2017-06-06 VITALS — BP 108/78 | HR 71 | Ht 71.0 in | Wt 189.0 lb

## 2017-06-06 DIAGNOSIS — G4701 Insomnia due to medical condition: Secondary | ICD-10-CM | POA: Diagnosis not present

## 2017-06-06 DIAGNOSIS — R778 Other specified abnormalities of plasma proteins: Secondary | ICD-10-CM

## 2017-06-06 MED ORDER — MIRTAZAPINE 7.5 MG PO TABS: 7.5000 mg | ORAL_TABLET | Freq: Every day | ORAL | 1 refills | Status: DC

## 2017-06-06 NOTE — Progress Notes (Addendum)
Subjective:  Patient ID: Dillon Andrews, male    DOB: Mar 13, 1941  Age: 77 y.o. MRN: [id number]  CC: Follow-up   HPI [name] presents for.  He to be helping a great deal.  He is also using sleepy time tea with some success.  He continues to take melatonin as needed.  He was able to play golf this past Tuesday but had to pick up after 11 holes.  He played again on Thursday and was able to make it for 13 holes.  He just has been experiencing fatigue.  He denies chest pain or shortness of breath.  He is able to take a deep breath.  Outpatient Medications Prior to Visit  Medication Sig Dispense Refill  . aspirin EC 81 MG tablet Take 81 mg by mouth daily.    . carvedilol (COREG) 6.25 MG tablet Take 1 tablet (6.25 mg total) by mouth 2 (two) times daily with a meal. 180 tablet 0  . gabapentin (NEURONTIN) 800 MG tablet Take 1 tablet (800 mg total) by mouth at bedtime. 90 tablet 1  . glucose blood test strip Use as instructed 100 each 12  . omeprazole (PRILOSEC) 40 MG capsule Take 40 mg by mouth daily.    . rosuvastatin (CRESTOR) 10 MG tablet Take 10 mg by mouth daily.    . vitamin B-12 (CYANOCOBALAMIN) 500 MCG tablet Take 500 mcg by mouth every other day.    . furosemide (LASIX) 80 MG tablet Take 80 mg tablet  In the morning, may take an extra 40 mg to 80 mg daily depend on weight gain 180 tablet 3  . glipiZIDE (GLUCOTROL) 5 MG tablet Take 2.5 mg by mouth 2 (two) times daily.  0  . mirtazapine (REMERON) 7.5 MG tablet Take 1 tablet (7.5 mg total) by mouth at bedtime. 30 tablet 1  . potassium chloride SA (K-DUR,KLOR-CON) 20 MEQ tablet Take 20 mEq by mouth daily.    Marland Kitchen spironolactone (ALDACTONE) 25 MG tablet Take 1 tablet (25 mg total) by mouth daily. 90 tablet 3   No facility-administered medications prior to visit.     ROS Review of Systems  Constitutional: Negative.   Respiratory: Negative for chest tightness and shortness of breath.   Cardiovascular: Negative for chest pain and leg  swelling.  Gastrointestinal: Negative.   Skin: Negative.   Hematological: Does not bruise/bleed easily.  Psychiatric/Behavioral: Negative for dysphoric mood. The patient is not nervous/anxious.     Objective:  BP 108/78 (BP Location: Left Arm, Patient Position: Sitting, Cuff Size: Normal)   Pulse 71   Ht 5' 11"  (1.803 m)   Wt 189 lb (85.7 kg)   SpO2 96%   BMI 26.36 kg/m   BP Readings from Last 3 Encounters:  06/29/17 112/66  06/06/17 108/78  05/12/17 118/72    Wt Readings from Last 3 Encounters:  06/29/17 180 lb (81.6 kg)  06/06/17 189 lb (85.7 kg)  05/12/17 186 lb 3.2 oz (84.5 kg)    Physical Exam  Constitutional: He is oriented to person, place, and time. He appears well-developed and well-nourished. No distress.  HENT:  Head: Normocephalic and atraumatic.  Right Ear: External ear normal.  Left Ear: External ear normal.  Eyes: Right eye exhibits no discharge. Left eye exhibits no discharge. No scleral icterus.  Neck: No JVD present. No tracheal deviation present.  Pulmonary/Chest: Effort normal. No stridor.  Neurological: He is alert and oriented to person, place, and time.  Skin: Skin is warm and dry.  He is not diaphoretic.  Psychiatric: He has a normal mood and affect. His behavior is normal.    Lab Results  Component Value Date   WBC 8.1 06/29/2017   HGB 12.0 (L) 06/29/2017   HCT 38.3 (L) 06/29/2017   PLT 183 06/29/2017   GLUCOSE 191 (H) 07/04/2017   ALT 30 06/26/2017   AST 38 06/26/2017   NA 140 07/04/2017   K 4.0 07/04/2017   CL 96 (L) 07/04/2017   CREATININE 1.83 (H) 07/04/2017   BUN 42 (H) 07/04/2017   CO2 32 07/04/2017   TSH 2.456 06/26/2017   INR 1.28 11/22/2016   HGBA1C 6.9 (H) 03/27/2017    Dg Chest 2 View  Result Date: 03/27/2017 CLINICAL DATA:  Midline lower chest tightness.  Shortness of breath. EXAM: CHEST  2 VIEW COMPARISON:  Chest CT 03/20/2017.  Chest radiograph 11/25/2016 FINDINGS: Cardiac silhouette remains upper limits of normal.  There continues to be evidence for bilateral pleural effusions, left side greater than right. Upper lungs are clear without pulmonary edema. Atherosclerotic calcifications at the aortic arch. Trachea is midline. Bridging osteophytes throughout the thoracic spine. Degenerative changes in both shoulders and AC joints. IMPRESSION: Bilateral pleural effusions, left side greater than right. Left pleural effusion is small-moderate in size. Right pleural effusion is small in size. Minimal change from recent chest CT. Electronically Signed   By: Markus Daft M.D.   On: 03/27/2017 13:37    Assessment & Plan:   Finnean was seen today for follow-up.  Diagnoses and all orders for this visit:  Insomnia due to medical condition -     mirtazapine (REMERON) 7.5 MG tablet; Take 1 tablet (7.5 mg total) by mouth at bedtime.  Abnormal serum protein electrophoresis -     Ambulatory referral to Hematology   I am having [name] maintain his aspirin EC, omeprazole, vitamin B-12, rosuvastatin, gabapentin, glucose blood, carvedilol, and mirtazapine.  Meds ordered this encounter  Medications  . mirtazapine (REMERON) 7.5 MG tablet    Sig: Take 1 tablet (7.5 mg total) by mouth at bedtime.    Dispense:  30 tablet    Refill:  1     Follow-up: Return in about 1 month (around 07/04/2017).  [name], MD

## 2017-06-12 ENCOUNTER — Other Ambulatory Visit: Payer: Medicare HMO

## 2017-06-12 DIAGNOSIS — H43811 Vitreous degeneration, right eye: Secondary | ICD-10-CM | POA: Diagnosis not present

## 2017-06-12 DIAGNOSIS — H0100A Unspecified blepharitis right eye, upper and lower eyelids: Secondary | ICD-10-CM | POA: Diagnosis not present

## 2017-06-12 DIAGNOSIS — I428 Other cardiomyopathies: Secondary | ICD-10-CM | POA: Diagnosis not present

## 2017-06-12 DIAGNOSIS — N183 Chronic kidney disease, stage 3 (moderate): Secondary | ICD-10-CM | POA: Diagnosis not present

## 2017-06-12 DIAGNOSIS — Z961 Presence of intraocular lens: Secondary | ICD-10-CM | POA: Diagnosis not present

## 2017-06-12 DIAGNOSIS — I2729 Other secondary pulmonary hypertension: Secondary | ICD-10-CM | POA: Diagnosis not present

## 2017-06-12 DIAGNOSIS — H18003 Unspecified corneal deposit, bilateral: Secondary | ICD-10-CM | POA: Diagnosis not present

## 2017-06-12 DIAGNOSIS — H0100B Unspecified blepharitis left eye, upper and lower eyelids: Secondary | ICD-10-CM | POA: Diagnosis not present

## 2017-06-12 DIAGNOSIS — D3132 Benign neoplasm of left choroid: Secondary | ICD-10-CM | POA: Diagnosis not present

## 2017-06-12 DIAGNOSIS — I5042 Chronic combined systolic (congestive) and diastolic (congestive) heart failure: Secondary | ICD-10-CM | POA: Diagnosis not present

## 2017-06-12 DIAGNOSIS — E119 Type 2 diabetes mellitus without complications: Secondary | ICD-10-CM | POA: Diagnosis not present

## 2017-06-12 DIAGNOSIS — H4321 Crystalline deposits in vitreous body, right eye: Secondary | ICD-10-CM | POA: Diagnosis not present

## 2017-06-12 DIAGNOSIS — Z7984 Long term (current) use of oral hypoglycemic drugs: Secondary | ICD-10-CM | POA: Diagnosis not present

## 2017-06-12 DIAGNOSIS — Z79899 Other long term (current) drug therapy: Secondary | ICD-10-CM | POA: Diagnosis not present

## 2017-06-12 NOTE — Progress Notes (Signed)
Pt came to have labs drawn for Dr. Glenetta Hew (of [place]); Pt's PCP is Dr. Ethelene Hal. Pt provided [facility] requisition, generic pt label was placed on tube for Basic metabolic panel test. -DMG

## 2017-06-13 LAB — BASIC METABOLIC PANEL
BUN / CREAT RATIO: 21 (ref 10–24)
BUN: 38 mg/dL — ABNORMAL HIGH (ref 8–27)
CALCIUM: 9.8 mg/dL (ref 8.6–10.2)
CO2: 28 mmol/L (ref 20–29)
Chloride: 101 mmol/L (ref 96–106)
Creatinine, Ser: 1.82 mg/dL — ABNORMAL HIGH (ref 0.76–1.27)
GFR calc non Af Amer: 35 mL/min/{1.73_m2} — ABNORMAL LOW (ref >59)
GFR, EST AFRICAN AMERICAN: 41 mL/min/{1.73_m2} — AB (ref >59)
GLUCOSE: 66 mg/dL (ref 65–99)
POTASSIUM: 5.4 mmol/L — AB (ref 3.5–5.2)
Sodium: 148 mmol/L — ABNORMAL HIGH (ref 134–144)

## 2017-06-13 LAB — SPECIMEN STATUS REPORT

## 2017-06-16 ENCOUNTER — Telehealth: Payer: Self-pay | Admitting: *Deleted

## 2017-06-16 DIAGNOSIS — E875 Hyperkalemia: Secondary | ICD-10-CM

## 2017-06-16 NOTE — Telephone Encounter (Signed)
-----   Message from Leonie Man, MD sent at 06/15/2017  6:51 PM EST ----- Chemistry panel shows probably little bit of dehydration.  Would recommend backing off on Lasix to half dose for the next 2-3 days. We will also have him hold potassium supplementation and spironolactone.  Recheck BMP in 1 week.  Glenetta Hew, MD

## 2017-06-16 NOTE — Telephone Encounter (Signed)
Spoke to patient. Result given. Verbalized understanding  patient aware not take potassium , spironolactone and reduce lasix 40 mg for 3 days(1/2 of 80 mg  Daily) Will go tho the lab next Tuesday 06/24/17 at [place] office

## 2017-06-25 ENCOUNTER — Inpatient Hospital Stay (HOSPITAL_BASED_OUTPATIENT_CLINIC_OR_DEPARTMENT_OTHER)
Admission: EM | Admit: 2017-06-25 | Discharge: 2017-06-29 | DRG: 291 | Disposition: A | Discharge status: Home / Self Care | Payer: Medicare HMO | Attending: Internal Medicine | Admitting: Internal Medicine

## 2017-06-25 ENCOUNTER — Emergency Department (HOSPITAL_BASED_OUTPATIENT_CLINIC_OR_DEPARTMENT_OTHER): Payer: Medicare HMO

## 2017-06-25 ENCOUNTER — Telehealth: Payer: Self-pay | Admitting: Cardiology

## 2017-06-25 ENCOUNTER — Encounter (HOSPITAL_BASED_OUTPATIENT_CLINIC_OR_DEPARTMENT_OTHER): Payer: Self-pay

## 2017-06-25 ENCOUNTER — Other Ambulatory Visit: Payer: Self-pay

## 2017-06-25 DIAGNOSIS — Z9049 Acquired absence of other specified parts of digestive tract: Secondary | ICD-10-CM

## 2017-06-25 DIAGNOSIS — Z7984 Long term (current) use of oral hypoglycemic drugs: Secondary | ICD-10-CM

## 2017-06-25 DIAGNOSIS — K7460 Unspecified cirrhosis of liver: Secondary | ICD-10-CM | POA: Diagnosis present

## 2017-06-25 DIAGNOSIS — I10 Essential (primary) hypertension: Secondary | ICD-10-CM | POA: Diagnosis present

## 2017-06-25 DIAGNOSIS — I8510 Secondary esophageal varices without bleeding: Secondary | ICD-10-CM | POA: Diagnosis present

## 2017-06-25 DIAGNOSIS — J9811 Atelectasis: Secondary | ICD-10-CM | POA: Diagnosis not present

## 2017-06-25 DIAGNOSIS — R748 Abnormal levels of other serum enzymes: Secondary | ICD-10-CM | POA: Diagnosis present

## 2017-06-25 DIAGNOSIS — E1122 Type 2 diabetes mellitus with diabetic chronic kidney disease: Secondary | ICD-10-CM | POA: Diagnosis not present

## 2017-06-25 DIAGNOSIS — R778 Other specified abnormalities of plasma proteins: Secondary | ICD-10-CM | POA: Diagnosis present

## 2017-06-25 DIAGNOSIS — E876 Hypokalemia: Secondary | ICD-10-CM | POA: Diagnosis present

## 2017-06-25 DIAGNOSIS — N183 Chronic kidney disease, stage 3 unspecified: Secondary | ICD-10-CM | POA: Diagnosis present

## 2017-06-25 DIAGNOSIS — E1142 Type 2 diabetes mellitus with diabetic polyneuropathy: Secondary | ICD-10-CM | POA: Diagnosis present

## 2017-06-25 DIAGNOSIS — I4510 Unspecified right bundle-branch block: Secondary | ICD-10-CM | POA: Diagnosis present

## 2017-06-25 DIAGNOSIS — Z96652 Presence of left artificial knee joint: Secondary | ICD-10-CM | POA: Diagnosis present

## 2017-06-25 DIAGNOSIS — I081 Rheumatic disorders of both mitral and tricuspid valves: Secondary | ICD-10-CM | POA: Diagnosis present

## 2017-06-25 DIAGNOSIS — Z85828 Personal history of other malignant neoplasm of skin: Secondary | ICD-10-CM

## 2017-06-25 DIAGNOSIS — Z9842 Cataract extraction status, left eye: Secondary | ICD-10-CM | POA: Diagnosis not present

## 2017-06-25 DIAGNOSIS — R0602 Shortness of breath: Secondary | ICD-10-CM | POA: Diagnosis not present

## 2017-06-25 DIAGNOSIS — I5043 Acute on chronic combined systolic (congestive) and diastolic (congestive) heart failure: Secondary | ICD-10-CM | POA: Diagnosis not present

## 2017-06-25 DIAGNOSIS — E119 Type 2 diabetes mellitus without complications: Secondary | ICD-10-CM

## 2017-06-25 DIAGNOSIS — Z803 Family history of malignant neoplasm of breast: Secondary | ICD-10-CM

## 2017-06-25 DIAGNOSIS — E7800 Pure hypercholesterolemia, unspecified: Secondary | ICD-10-CM | POA: Diagnosis present

## 2017-06-25 DIAGNOSIS — I509 Heart failure, unspecified: Secondary | ICD-10-CM | POA: Diagnosis not present

## 2017-06-25 DIAGNOSIS — J90 Pleural effusion, not elsewhere classified: Secondary | ICD-10-CM

## 2017-06-25 DIAGNOSIS — Z7982 Long term (current) use of aspirin: Secondary | ICD-10-CM

## 2017-06-25 DIAGNOSIS — K219 Gastro-esophageal reflux disease without esophagitis: Secondary | ICD-10-CM | POA: Diagnosis present

## 2017-06-25 DIAGNOSIS — G2581 Restless legs syndrome: Secondary | ICD-10-CM | POA: Diagnosis present

## 2017-06-25 DIAGNOSIS — I130 Hypertensive heart and chronic kidney disease with heart failure and stage 1 through stage 4 chronic kidney disease, or unspecified chronic kidney disease: Secondary | ICD-10-CM | POA: Diagnosis not present

## 2017-06-25 DIAGNOSIS — I428 Other cardiomyopathies: Secondary | ICD-10-CM | POA: Diagnosis not present

## 2017-06-25 DIAGNOSIS — K2270 Barrett's esophagus without dysplasia: Secondary | ICD-10-CM | POA: Diagnosis present

## 2017-06-25 DIAGNOSIS — I2510 Atherosclerotic heart disease of native coronary artery without angina pectoris: Secondary | ICD-10-CM | POA: Diagnosis present

## 2017-06-25 DIAGNOSIS — I472 Ventricular tachycardia: Secondary | ICD-10-CM | POA: Diagnosis not present

## 2017-06-25 DIAGNOSIS — E785 Hyperlipidemia, unspecified: Secondary | ICD-10-CM | POA: Diagnosis present

## 2017-06-25 DIAGNOSIS — M1990 Unspecified osteoarthritis, unspecified site: Secondary | ICD-10-CM | POA: Diagnosis present

## 2017-06-25 DIAGNOSIS — R079 Chest pain, unspecified: Secondary | ICD-10-CM | POA: Diagnosis not present

## 2017-06-25 DIAGNOSIS — I5023 Acute on chronic systolic (congestive) heart failure: Secondary | ICD-10-CM | POA: Diagnosis not present

## 2017-06-25 DIAGNOSIS — K7581 Nonalcoholic steatohepatitis (NASH): Secondary | ICD-10-CM | POA: Diagnosis present

## 2017-06-25 DIAGNOSIS — Z961 Presence of intraocular lens: Secondary | ICD-10-CM | POA: Diagnosis present

## 2017-06-25 DIAGNOSIS — I700 Atherosclerosis of aorta: Secondary | ICD-10-CM | POA: Diagnosis present

## 2017-06-25 DIAGNOSIS — Z9841 Cataract extraction status, right eye: Secondary | ICD-10-CM

## 2017-06-25 DIAGNOSIS — Z888 Allergy status to other drugs, medicaments and biological substances status: Secondary | ICD-10-CM | POA: Diagnosis not present

## 2017-06-25 DIAGNOSIS — I5042 Chronic combined systolic (congestive) and diastolic (congestive) heart failure: Secondary | ICD-10-CM

## 2017-06-25 DIAGNOSIS — Z87891 Personal history of nicotine dependence: Secondary | ICD-10-CM

## 2017-06-25 DIAGNOSIS — I110 Hypertensive heart disease with heart failure: Secondary | ICD-10-CM | POA: Diagnosis not present

## 2017-06-25 DIAGNOSIS — Z79899 Other long term (current) drug therapy: Secondary | ICD-10-CM

## 2017-06-25 HISTORY — DX: Supraventricular tachycardia: I47.1

## 2017-06-25 HISTORY — DX: Other ventricular tachycardia: I47.29

## 2017-06-25 HISTORY — DX: Nonrheumatic mitral (valve) insufficiency: I34.0

## 2017-06-25 HISTORY — DX: Rheumatic tricuspid insufficiency: I07.1

## 2017-06-25 HISTORY — DX: Unspecified right bundle-branch block: I45.10

## 2017-06-25 HISTORY — DX: Ventricular tachycardia: I47.2

## 2017-06-25 HISTORY — DX: Other cardiomyopathies: I42.8

## 2017-06-25 HISTORY — DX: Family history of other specified conditions: Z84.89

## 2017-06-25 HISTORY — DX: Chronic combined systolic (congestive) and diastolic (congestive) heart failure: I50.42

## 2017-06-25 HISTORY — DX: Other supraventricular tachycardia: I47.19

## 2017-06-25 LAB — TROPONIN I
TROPONIN I: 0.25 ng/mL — AB (ref <0.03)
Troponin I: 0.27 ng/mL (ref <0.03)

## 2017-06-25 LAB — COMPREHENSIVE METABOLIC PANEL WITH GFR
ALT: 32 U/L (ref 17–63)
AST: 44 U/L — ABNORMAL HIGH (ref 15–41)
Albumin: 3.6 g/dL (ref 3.5–5.0)
Alkaline Phosphatase: 215 U/L — ABNORMAL HIGH (ref 38–126)
Anion gap: 10 (ref 5–15)
BUN: 35 mg/dL — ABNORMAL HIGH (ref 6–20)
CO2: 31 mmol/L (ref 22–32)
Calcium: 8.9 mg/dL (ref 8.9–10.3)
Chloride: 100 mmol/L — ABNORMAL LOW (ref 101–111)
Creatinine, Ser: 1.61 mg/dL — ABNORMAL HIGH (ref 0.61–1.24)
GFR calc Af Amer: 46 mL/min — ABNORMAL LOW
GFR calc non Af Amer: 40 mL/min — ABNORMAL LOW
Glucose, Bld: 105 mg/dL — ABNORMAL HIGH (ref 65–99)
Potassium: 3.3 mmol/L — ABNORMAL LOW (ref 3.5–5.1)
Sodium: 141 mmol/L (ref 135–145)
Total Bilirubin: 1.3 mg/dL — ABNORMAL HIGH (ref 0.3–1.2)
Total Protein: 7.1 g/dL (ref 6.5–8.1)

## 2017-06-25 LAB — CBC WITH DIFFERENTIAL/PLATELET
Basophils Absolute: 0.1 10*3/uL (ref 0.0–0.1)
Basophils Relative: 1 %
Eosinophils Absolute: 0.1 10*3/uL (ref 0.0–0.7)
Eosinophils Relative: 2 %
HCT: 38.6 % — ABNORMAL LOW (ref 39.0–52.0)
Hemoglobin: 12.2 g/dL — ABNORMAL LOW (ref 13.0–17.0)
Lymphocytes Relative: 17 %
Lymphs Abs: 1.2 10*3/uL (ref 0.7–4.0)
MCH: 27.1 pg (ref 26.0–34.0)
MCHC: 31.6 g/dL (ref 30.0–36.0)
MCV: 85.6 fL (ref 78.0–100.0)
MONO ABS: 1.1 10*3/uL — AB (ref 0.1–1.0)
Monocytes Relative: 15 %
NEUTROS PCT: 65 %
Neutro Abs: 4.8 10*3/uL (ref 1.7–7.7)
PLATELETS: 169 10*3/uL (ref 150–400)
RBC: 4.51 MIL/uL (ref 4.22–5.81)
RDW: 21.9 % — ABNORMAL HIGH (ref 11.5–15.5)
WBC: 7.3 10*3/uL (ref 4.0–10.5)

## 2017-06-25 LAB — GLUCOSE, CAPILLARY
GLUCOSE-CAPILLARY: 147 mg/dL — AB (ref 65–99)
Glucose-Capillary: 197 mg/dL — ABNORMAL HIGH (ref 65–99)

## 2017-06-25 LAB — BRAIN NATRIURETIC PEPTIDE: B Natriuretic Peptide: 420.9 pg/mL — ABNORMAL HIGH (ref 0.0–100.0)

## 2017-06-25 MED ORDER — ONDANSETRON HCL 4 MG PO TABS
4.0000 mg | ORAL_TABLET | Freq: Four times a day (QID) | ORAL | Status: DC | PRN
Start: 2017-06-25 — End: 2017-06-29

## 2017-06-25 MED ORDER — HYDROCODONE-ACETAMINOPHEN 5-325 MG PO TABS: 1.0000 | ORAL_TABLET | ORAL | Status: DC | PRN

## 2017-06-25 MED ORDER — SODIUM CHLORIDE 0.9% FLUSH
3.0000 mL | INTRAVENOUS | Status: DC | PRN
Start: 2017-06-25 — End: 2017-06-29

## 2017-06-25 MED ORDER — CARVEDILOL 6.25 MG PO TABS
6.2500 mg | ORAL_TABLET | Freq: Two times a day (BID) | ORAL | Status: DC
  Administered 2017-06-26 (×2): 6.25 mg via ORAL
  Filled 2017-06-25 (×3): qty 1

## 2017-06-25 MED ORDER — INSULIN ASPART 100 UNIT/ML ~~LOC~~ SOLN: 0.0000 [IU] | Freq: Every day | SUBCUTANEOUS | Status: DC

## 2017-06-25 MED ORDER — ENOXAPARIN SODIUM 40 MG/0.4ML ~~LOC~~ SOLN
40.0000 mg | SUBCUTANEOUS | Status: DC
  Administered 2017-06-26 – 2017-06-29 (×4): 40 mg via SUBCUTANEOUS
  Filled 2017-06-25 (×4): qty 0.4

## 2017-06-25 MED ORDER — SODIUM CHLORIDE 0.9 % IV SOLN: 250.0000 mL | INTRAVENOUS | Status: DC | PRN

## 2017-06-25 MED ORDER — SODIUM CHLORIDE 0.9% FLUSH
3.0000 mL | Freq: Two times a day (BID) | INTRAVENOUS | Status: DC
  Administered 2017-06-26 – 2017-06-29 (×6): 3 mL via INTRAVENOUS

## 2017-06-25 MED ORDER — ACETAMINOPHEN 325 MG PO TABS: 650.0000 mg | ORAL_TABLET | Freq: Four times a day (QID) | ORAL | Status: DC | PRN

## 2017-06-25 MED ORDER — ZOLPIDEM TARTRATE 5 MG PO TABS
5.0000 mg | ORAL_TABLET | Freq: Once | ORAL | Status: AC
  Administered 2017-06-25: 5 mg via ORAL
  Filled 2017-06-25: qty 1

## 2017-06-25 MED ORDER — ACETAMINOPHEN 650 MG RE SUPP
650.0000 mg | Freq: Four times a day (QID) | RECTAL | Status: DC | PRN
Start: 2017-06-25 — End: 2017-06-29

## 2017-06-25 MED ORDER — FUROSEMIDE 10 MG/ML IJ SOLN
60.0000 mg | Freq: Two times a day (BID) | INTRAMUSCULAR | Status: DC
  Administered 2017-06-25 – 2017-06-28 (×6): 60 mg via INTRAVENOUS
  Filled 2017-06-25 (×7): qty 6

## 2017-06-25 MED ORDER — PANTOPRAZOLE SODIUM 40 MG PO TBEC
40.0000 mg | DELAYED_RELEASE_TABLET | Freq: Every day | ORAL | Status: DC
  Administered 2017-06-26 – 2017-06-29 (×4): 40 mg via ORAL
  Filled 2017-06-25 (×4): qty 1

## 2017-06-25 MED ORDER — GABAPENTIN 400 MG PO CAPS
800.0000 mg | ORAL_CAPSULE | Freq: Every day | ORAL | Status: DC
  Administered 2017-06-26 – 2017-06-28 (×3): 800 mg via ORAL
  Filled 2017-06-25 (×4): qty 2

## 2017-06-25 MED ORDER — POTASSIUM CHLORIDE CRYS ER 20 MEQ PO TBCR
40.0000 meq | EXTENDED_RELEASE_TABLET | Freq: Once | ORAL | Status: AC
  Administered 2017-06-25: 40 meq via ORAL
  Filled 2017-06-25: qty 2

## 2017-06-25 MED ORDER — MIRTAZAPINE 15 MG PO TABS
7.5000 mg | ORAL_TABLET | Freq: Every day | ORAL | Status: DC
  Administered 2017-06-25 – 2017-06-28 (×3): 7.5 mg via ORAL
  Filled 2017-06-25 (×4): qty 1

## 2017-06-25 MED ORDER — ONDANSETRON HCL 4 MG/2ML IJ SOLN: 4.0000 mg | Freq: Four times a day (QID) | INTRAMUSCULAR | Status: DC | PRN

## 2017-06-25 MED ORDER — ROSUVASTATIN CALCIUM 10 MG PO TABS
10.0000 mg | ORAL_TABLET | Freq: Every day | ORAL | Status: DC
  Administered 2017-06-26 – 2017-06-29 (×4): 10 mg via ORAL
  Filled 2017-06-25 (×4): qty 1

## 2017-06-25 MED ORDER — INSULIN ASPART 100 UNIT/ML ~~LOC~~ SOLN
0.0000 [IU] | Freq: Three times a day (TID) | SUBCUTANEOUS | Status: DC
  Administered 2017-06-26: 3 [IU] via SUBCUTANEOUS
  Administered 2017-06-27 – 2017-06-28 (×3): 2 [IU] via SUBCUTANEOUS

## 2017-06-25 MED ORDER — ASPIRIN EC 81 MG PO TBEC
81.0000 mg | DELAYED_RELEASE_TABLET | Freq: Every day | ORAL | Status: DC
  Administered 2017-06-26 – 2017-06-29 (×4): 81 mg via ORAL
  Filled 2017-06-25 (×4): qty 1

## 2017-06-25 NOTE — Progress Notes (Signed)
[facility] admissions informed of patient arrival to [id number]

## 2017-06-25 NOTE — Progress Notes (Signed)
Dr Lorin Mercy made aware of patient arrival via text page

## 2017-06-25 NOTE — Progress Notes (Signed)
Called by Dr. Alvino Chapel from [facility] regarding this patient.  He is a 77yo with h/o CHF presenting with exacerbation.  Troponin similar to prior episodes.  Ellyn Hack is cardiologist.  Admit to telemetry.  Carlyon Shadow, M.D.

## 2017-06-25 NOTE — ED Notes (Signed)
ED Provider at bedside. 

## 2017-06-25 NOTE — H&P (Signed)
Dillon Andrews INO:[id number] DOB: [birth year] DOA: 06/25/2017     PCP: [name], MD   Outpatient Specialists: Cardiology Ellyn Hack Patient coming from:   home Lives   With family    Chief Complaint: Progressive shortness of  HPI: Dillon Andrews is a 77 y.o. male with medical history significant of systolic diastolic heart failure, CKD stage III DM type II esophageal varices secondary to cirrhosis secondary to NASH, GERD, HTN, HLD     Presented with 2-3-day history of worsening shortness of breath unable to sleep test set up in the recliner secondary to severe shortness of breath unable to walk more than 10 feet his daughter called today to cardiologist recommended to be evaluated and family took her father to [place]. Noted leg edema. He is supposed to take extra lasix if fluid retained but has not. Denies any chest pain no fever no chills, reports trouble sleeping.  Last hospitalization for CHF was in December 2018 2D echo from 2018 showed EF 40-45% with diffuse hypokinesis and grade 2 diastolic dysfunction with severe RA dilatation he has undergone cardiac catheterization in 2018 June with mild CAD right have catheterization was also done at that time MRI was only mild by TEE done in July 2018 He has been maintained on Lasix E 80 mg plus sliding-scale depending weight as well as spironolactone 25 mg a day Patient dry weight is around 189 pounds  While in ER:  Significant initial  Findings: Troponin 0 0.25 same as during prior admission  BNP 420 same as during prior admission K 3.3  Cr 1.61 at baseline  WBC 7.3 Hg 12.2 PLt 169  CXR - Cardiomegaly, CHF  IN ER:  Temp (24hrs), Avg:98.2 F (36.8 C), Min:97.8 F (36.6 C), Max:98.4 F (36.9 C)      on arrival  ED Triage Vitals  Enc Vitals Group     BP 06/25/17 1122 123/83     Pulse Rate 06/25/17 1122 78     Resp 06/25/17 1122 18     Temp 06/25/17 1122 97.8 F (36.6 C)     Temp Source 06/25/17  1122 Oral     SpO2 06/25/17 1122 100 %     Weight 06/25/17 1122 194 lb 10.7 oz (88.3 kg)     Height 06/25/17 1122 5' 11"  (1.803 m)     Head Circumference --      Peak Flow --      Pain Score 06/25/17 1344 0     Pain Loc --      Pain Edu? --      Excl. in [place]? --     Latest RR to 2 satting 94% HR 82 BP 117/86  Following Medications were ordered in ER: Medications - No data to display    Hospitalist was called for admission for CHF exacerbation  Regarding pertinent Chronic problems: History of diabetes on glipizide History of hypertension currently on Spironolactone chlamydial and Lasix Review of Systems:    Pertinent positives include: shortness of breath at rest, dyspnea on exertion, Bilateral lower extremity swelling   Constitutional:  No weight loss, night sweats, Fevers, chills, fatigue, weight loss  HEENT:  No headaches, Difficulty swallowing,Tooth/dental problems,Sore throat,  No sneezing, itching, ear ache, nasal congestion, post nasal drip,  Cardio-vascular:  No chest pain, Orthopnea, PND, anasarca, dizziness, palpitations.no GI:  No heartburn, indigestion, abdominal pain, nausea, vomiting, diarrhea, change in bowel habits, loss of appetite, melena, blood in stool, hematemesis Resp:  No excess mucus, no productive cough, No non-productive cough, No coughing up of blood.No change in color of mucus.No wheezing. Skin:  no rash or lesions. No jaundice GU:  no dysuria, change in color of urine, no urgency or frequency. No straining to urinate.  No flank pain.  Musculoskeletal:  No joint pain or no joint swelling. No decreased range of motion. No back pain.  Psych:  No change in mood or affect. No depression or anxiety. No memory loss.  Neuro: no localizing neurological complaints, no tingling, no weakness, no double vision, no gait abnormality, no slurred speech, no confusion  As per HPI otherwise 10 point review of systems negative.   Past Medical History: Past  Medical History:  Diagnosis Date  . Anemia 11/2016  . Arthritis    "some; hands/fingers" (03/27/2017)  . Barrett's esophagus   . Basal cell carcinoma    "face, arms, hands" (03/27/2017)  . Cataracts, bilateral   . CHF (congestive heart failure) ([place])   . Chicken pox   . Cholelithiasis 11/2016   S/P LAP CHOLE  . CKD (chronic kidney disease), stage III ([place])   . Esophageal varices (HCC)    Secondary to NAFLD  . GERD (gastroesophageal reflux disease)   . High cholesterol   . Hypertension   . NAFLD (nonalcoholic fatty liver disease)   . Pneumonia 09/2016  . Type II diabetes mellitus ([place])    Past Surgical History:  Procedure Laterality Date  . BACK SURGERY    . BASAL CELL CARCINOMA EXCISION Right    temple; "had to do skin graft"  . CARDIAC CATHETERIZATION  09/2016   Minimal, non-occlusive CAD, EF 45% [facility])  . CARPAL TUNNEL RELEASE Bilateral   . CATARACT EXTRACTION W/ INTRAOCULAR LENS  IMPLANT, BILATERAL Bilateral   . CHOLECYSTECTOMY N/A 11/22/2016   Procedure: LAPAROSCOPIC CHOLECYSTECTOMY;  Surgeon: Coralie Keens, MD;  Location: [place];  Service: General;  Laterality: N/A;  . JOINT REPLACEMENT    . LUMBAR DISC SURGERY  X 2  . TONSILLECTOMY    . TOTAL KNEE ARTHROPLASTY Left 2010  . TRANSESOPHAGEAL ECHOCARDIOGRAM  09/2016   Mild global LV hypokinesis. Moderate MR. Moderate TR. [facility])  . TRANSTHORACIC ECHOCARDIOGRAM  10/2016   [facility]) EF 35-40% with moderate HK. Normal RV function. Moderately severe TR.  Marland Kitchen TRANSTHORACIC ECHOCARDIOGRAM  03/2017    [facility]) mild concentric LVH. Moderately reduced EF of 40-45%. Diffuse HK with no regional wall motion abnormality. GR 2 DD. Septal dyssynergy. Mild MR, moderate TR. Severe RA dilation..     Social History:  Ambulatory  independently     reports that he quit smoking about 40 years ago. His smoking use included cigarettes. He has a 4.50 pack-year smoking history. he has never used  smokeless tobacco. He reports that he drinks alcohol. He reports that he does not use drugs.  Allergies:  No Known Allergies    Family History:   Family History  Problem Relation Age of Onset  . Cancer Father   . Cataracts Father   . Breast cancer Sister   . Cirrhosis Neg Hx     Medications: Prior to Admission medications   Medication Sig Start Date End Date Taking? Authorizing Provider  aspirin EC 81 MG tablet Take 81 mg by mouth daily. 12/23/14  Yes [provider]  carvedilol (COREG) 6.25 MG tablet Take 1 tablet (6.25 mg total) by mouth 2 (two) times daily with a meal. 04/29/17  Yes Glenetta Hew  W, MD  furosemide (LASIX) 80 MG tablet Take 80 mg tablet  In the morning, may take an extra 40 mg to 80 mg daily depend on weight gain 05/12/17  Yes Leonie Man, MD  gabapentin (NEURONTIN) 800 MG tablet Take 1 tablet (800 mg total) by mouth at bedtime. 03/05/17  Yes [name], MD  glipiZIDE (GLUCOTROL) 5 MG tablet Take 2.5 mg by mouth 2 (two) times daily. 09/21/16  Yes [provider]  mirtazapine (REMERON) 7.5 MG tablet Take 1 tablet (7.5 mg total) by mouth at bedtime. 06/06/17  Yes [name], MD  potassium chloride SA (K-DUR,KLOR-CON) 20 MEQ tablet Take 20 mEq by mouth daily.   Yes [provider]  rosuvastatin (CRESTOR) 10 MG tablet Take 10 mg by mouth daily.   Yes [provider]  spironolactone (ALDACTONE) 25 MG tablet Take 1 tablet (25 mg total) by mouth daily. 05/12/17 08/10/17 Yes Leonie Man, MD  vitamin B-12 (CYANOCOBALAMIN) 500 MCG tablet Take 500 mcg by mouth every other day.   Yes [provider]  glucose blood test strip Use as instructed 03/05/17   [name], MD  omeprazole (PRILOSEC) 40 MG capsule Take 40 mg by mouth daily.    [provider]    Physical Exam: Patient Vitals for the past 24 hrs:  BP Temp Temp src Pulse Resp SpO2 Height Weight  06/25/17 1659 117/71 98.4 F (36.9  C) Oral 86 18 93 % 5' 11"  (1.803 m) 88 kg (194 lb)  06/25/17 1557 117/86 - - 82 (!) 22 94 % - -  06/25/17 1538 117/86 98.4 F (36.9 C) Oral 85 (!) 27 93 % - -  06/25/17 1401 110/76 - - 77 20 95 % - -  06/25/17 1400 110/76 - - 72 17 95 % - -  06/25/17 1340 - - - - - 94 % - -  06/25/17 1122 123/83 97.8 F (36.6 C) Oral 78 18 100 % 5' 11"  (1.803 m) 88.3 kg (194 lb 10.7 oz)    1. General:  in No Acute distress  well  -appearing 2. Psychological: Alert and Oriented 3. Head/ENT:   Moist  Mucous Membranes                          Head Non traumatic, neck supple                          Poor Dentition 4. SKIN: normal   Skin turgor,  Skin clean Dry and intact no rash 5. Heart: Regular rate and rhythm no Murmur, no Rub or gallop 6. Lungs:   no wheezes some crackles   7. Abdomen: Soft, non-tender, Non distended   obese   8. Lower extremities: no clubbing, cyanosis, or edema 9. Neurologically Grossly intact, moving all 4 extremities equally  10. MSK: Normal range of motion   body mass index is 27.06 kg/m.  Labs on Admission:   Labs on Admission: I have personally reviewed following labs and imaging studies  CBC: Recent Labs  Lab 06/25/17 1207  WBC 7.3  NEUTROABS 4.8  HGB 12.2*  HCT 38.6*  MCV 85.6  PLT 784   Basic Metabolic Panel: Recent Labs  Lab 06/25/17 1207  NA 141  K 3.3*  CL 100*  CO2 31  GLUCOSE 105*  BUN 35*  CREATININE 1.61*  CALCIUM 8.9   GFR: Estimated Creatinine Clearance: 40.9  mL/min (A) (by C-G formula based on SCr of 1.61 mg/dL (H)). Liver Function Tests: Recent Labs  Lab 06/25/17 1207  AST 44*  ALT 32  ALKPHOS 215*  BILITOT 1.3*  PROT 7.1  ALBUMIN 3.6   No results for input(s): LIPASE, AMYLASE in the last 168 hours. No results for input(s): AMMONIA in the last 168 hours. Coagulation Profile: No results for input(s): INR, PROTIME in the last 168 hours. Cardiac Enzymes: Recent Labs  Lab 06/25/17 1207  TROPONINI 0.25*   BNP (last 3  results) No results for input(s): PROBNP in the last 8760 hours. HbA1C: No results for input(s): HGBA1C in the last 72 hours. CBG: Recent Labs  Lab 06/25/17 1705  GLUCAP 197*   Lipid Profile: No results for input(s): CHOL, HDL, LDLCALC, TRIG, CHOLHDL, LDLDIRECT in the last 72 hours. Thyroid Function Tests: No results for input(s): TSH, T4TOTAL, FREET4, T3FREE, THYROIDAB in the last 72 hours. Anemia Panel: No results for input(s): VITAMINB12, FOLATE, FERRITIN, TIBC, IRON, RETICCTPCT in the last 72 hours. Urine analysis: Sepsis Labs: @LABRCNTIP (procalcitonin:4,lacticidven:4) )No results found for this or any previous visit (from the past 240 hour(s)).    UA not ordered  Lab Results  Component Value Date   HGBA1C 6.9 (H) 03/27/2017    Estimated Creatinine Clearance: 40.9 mL/min (A) (by C-G formula based on SCr of 1.61 mg/dL (H)).  BNP (last 3 results) No results for input(s): PROBNP in the last 8760 hours.   ECG REPORT  Independently reviewed Rate: 71  Rhythm: RBBB ST&T Change: No acute ischemic changes  QTC 515  Filed Weights   06/25/17 1122 06/25/17 1659  Weight: 88.3 kg (194 lb 10.7 oz) 88 kg (194 lb)     Cultures: No results found for: SDES, SPECREQUEST, CULT, REPTSTATUS   Radiological Exams on Admission: Dg Chest 2 View  Result Date: 06/25/2017 CLINICAL DATA:  Progressively worsening shortness of breath. EXAM: CHEST - 2 VIEW COMPARISON:  03/27/2017 FINDINGS: Cardiomegaly as seen previously. Aortic atherosclerosis. Bilateral pleural effusions layering dependently with dependent pulmonary atelectasis. Upper lungs are clear. No acute bone finding. IMPRESSION: Cardiomegaly, aortic atherosclerosis, pleural effusions and basilar atelectasis. Findings most consistent with chronic congestive heart failure. Electronically Signed   By: Nelson Chimes M.D.   On: 06/25/2017 12:03    Chart has been reviewed    Assessment/Plan  77 y.o. male with medical history  significant of systolic diastolic heart failure, CKD stage III DM type II esophageal varices secondary to cirrhosis secondary to NASH, GERD, HTn, HLD   Admitted for  acute on chronic combined systolic diastolic CHF exacerbation.  Present on Admission: CHF eaxerbation - - admit on telemetry, cycle cardiac enzymes, obtain serial ECG, to evaluate for ischemia as a cause of heart failure  monitor daily weight  diurese with IV lasix and monitor orthostatics and creatinine to avoid over diuresis.  Order echogram to evaluate EF and valves  not on ACe due to renal disease  cardiology consult  . Restless leg syndrome - stable contiue home medications . Essential hypertension - continue home medicaitions . Elevated troponin - likely demand I the setting of CHF exacerbation . CKD (chronic kidney disease) stage 3, GFR 30-59 ml/min (HCC) - stable avoid nephrotoxic medication  . Hypokalemia - will replace  DM 2-  - Order   moderate SSI     -  check TSH and HgA1C  - Hold by mouth medications     Other plan as per orders.  DVT prophylaxis:   Lovenox  Code Status:  FULL CODE  as per patient    Family Communication:   Family not  at  Bedside    Disposition Plan:   To home once workup is complete and patient is stable                                            Consults called:  Emailed cardiology   Admission status:   inpatient     Level of care     tele          I have spent a total of 56 min on this admission  Shealynn Saulnier 06/25/2017, 10:29 PM    [facility]  Pager [phone]   after 2 AM please page floor coverage PA If 7AM-7PM, please contact the day team taking care of the patient  [email or web address]  Password [email or web address]

## 2017-06-25 NOTE — ED Triage Notes (Signed)
C/o SOB-feels sx started August 2018-"getting worse" x 1 month-NAD but states he has DOE

## 2017-06-25 NOTE — ED Notes (Signed)
Patient transported to X-ray 

## 2017-06-25 NOTE — ED Provider Notes (Signed)
[facility] EMERGENCY DEPARTMENT Provider Note   CSN: [id number] Arrival date & time: 06/25/17  1118     History   Chief Complaint Chief Complaint  Patient presents with  . Shortness of Breath    HPI Dillon Andrews is a 77 y.o. male.  HPI Patient presents with shortness of breath.  Has been having episodes of it since August but is been worse for the last month.  Increasing dyspnea on exertion.  No chest pain.  History of CHF.  States his weight may be going up.  Does not really weigh himself at home.  States he has been more short of breath with simple activities such as swinging his golf club.  States he has not golfed recently doing to the weather.  Does not think he be able to do it right now.  No fevers or chills.  Does have swelling in his legs.  States he is on Lasix. Past Medical History:  Diagnosis Date  . Anemia 11/2016  . Arthritis    "some; hands/fingers" (03/27/2017)  . Barrett's esophagus   . Basal cell carcinoma    "face, arms, hands" (03/27/2017)  . Cataracts, bilateral   . CHF (congestive heart failure) ([place])   . Chicken pox   . Cholelithiasis 11/2016   S/P LAP CHOLE  . CKD (chronic kidney disease), stage III ([place])   . Esophageal varices (HCC)    Secondary to NAFLD  . GERD (gastroesophageal reflux disease)   . High cholesterol   . Hypertension   . NAFLD (nonalcoholic fatty liver disease)   . Pneumonia 09/2016  . Type II diabetes mellitus [facility])     Patient Active Problem List   Diagnosis Date Noted  . Non-ischemic cardiomyopathy ([place]) 05/12/2017  . Mitral regurgitation 05/12/2017  . Other secondary pulmonary hypertension ([place]) 05/12/2017  . RBBB 05/12/2017  . Type II diabetes mellitus ([place])   . Essential hypertension   . CKD (chronic kidney disease), stage III ([place])   . Hospital discharge follow-up 04/03/2017  . SOB (shortness of breath) 03/05/2017  . Insomnia 03/05/2017  . Stress 03/05/2017  . Restless leg syndrome 03/05/2017  .  Cholecystitis 11/22/2016  . Pneumonia 11/21/2016  . CKD (chronic kidney disease) stage 3, GFR 30-59 ml/min (HCC) 11/19/2016  . DM2 (diabetes mellitus, type 2) ([place]) 11/19/2016  . Chronic combined systolic and diastolic heart failure ([place]) 11/19/2016  . Bilateral pleural effusion 11/19/2016  . Epigastric pain 11/19/2016  . Elevated troponin 11/19/2016  . Esophageal varices in cirrhosis ([place]) 11/19/2016    Past Surgical History:  Procedure Laterality Date  . BACK SURGERY    . BASAL CELL CARCINOMA EXCISION Right    temple; "had to do skin graft"  . CARDIAC CATHETERIZATION  09/2016   Minimal, non-occlusive CAD, EF 45% [facility])  . CARPAL TUNNEL RELEASE Bilateral   . CATARACT EXTRACTION W/ INTRAOCULAR LENS  IMPLANT, BILATERAL Bilateral   . CHOLECYSTECTOMY N/A 11/22/2016   Procedure: LAPAROSCOPIC CHOLECYSTECTOMY;  Surgeon: Coralie Keens, MD;  Location: [facility];  Service: General;  Laterality: N/A;  . JOINT REPLACEMENT    . LUMBAR DISC SURGERY  X 2  . TONSILLECTOMY    . TOTAL KNEE ARTHROPLASTY Left 2010  . TRANSESOPHAGEAL ECHOCARDIOGRAM  09/2016   Mild global LV hypokinesis. Moderate MR. Moderate TR. [facility])  . TRANSTHORACIC ECHOCARDIOGRAM  10/2016   [facility]) EF 35-40% with moderate HK. Normal RV function. Moderately severe TR.  Marland Kitchen TRANSTHORACIC ECHOCARDIOGRAM  03/2017    (  [place]) mild concentric LVH. Moderately reduced EF of 40-45%. Diffuse HK with no regional wall motion abnormality. GR 2 DD. Septal dyssynergy. Mild MR, moderate TR. Severe RA dilation..       Home Medications    Prior to Admission medications   Medication Sig Start Date End Date Taking? Authorizing Provider  aspirin EC 81 MG tablet Take 81 mg by mouth daily. 12/23/14   [provider]  carvedilol (COREG) 6.25 MG tablet Take 1 tablet (6.25 mg total) by mouth 2 (two) times daily with a meal. 04/29/17   Leonie Man, MD  furosemide (LASIX) 80 MG tablet Take 80 mg  tablet  In the morning, may take an extra 40 mg to 80 mg daily depend on weight gain 05/12/17   Leonie Man, MD  gabapentin (NEURONTIN) 800 MG tablet Take 1 tablet (800 mg total) by mouth at bedtime. 03/05/17   [name], MD  glipiZIDE (GLUCOTROL) 5 MG tablet Take 2.5 mg by mouth 2 (two) times daily. 09/21/16   [provider]  glucose blood test strip Use as instructed 03/05/17   [name], MD  mirtazapine (REMERON) 7.5 MG tablet Take 1 tablet (7.5 mg total) by mouth at bedtime. 06/06/17   [name], MD  omeprazole (PRILOSEC) 40 MG capsule Take 40 mg by mouth daily.    [provider]  potassium chloride SA (K-DUR,KLOR-CON) 20 MEQ tablet Take 20 mEq by mouth daily.    [provider]  rosuvastatin (CRESTOR) 10 MG tablet Take 10 mg by mouth daily.    [provider]  spironolactone (ALDACTONE) 25 MG tablet Take 1 tablet (25 mg total) by mouth daily. 05/12/17 08/10/17  Leonie Man, MD  vitamin B-12 (CYANOCOBALAMIN) 500 MCG tablet Take 500 mcg by mouth every other day.    [provider]    Family History Family History  Problem Relation Age of Onset  . Cancer Father   . Cataracts Father   . Breast cancer Sister   . Cirrhosis Neg Hx     Social History Social History   Tobacco Use  . Smoking status: Former Smoker    Packs/day: 0.30    Years: 15.00    Pack years: 4.50    Types: Cigarettes    Last attempt to quit: 03/17/1977    Years since quitting: 40.3  . Smokeless tobacco: Never Used  Substance Use Topics  . Alcohol use: Yes    Comment: 03/27/2017 "nothing in 6 months; used to have glass of wine q hs"  . Drug use: No     Allergies   Patient has no known allergies.   Review of Systems Review of Systems  Constitutional: Negative for appetite change.  HENT: Negative for congestion.   Respiratory: Positive for shortness of breath.   Cardiovascular: Positive for chest pain and leg  swelling.  Gastrointestinal: Negative for abdominal pain.  Genitourinary: Negative for flank pain.  Musculoskeletal: Negative for back pain.  Neurological: Negative for weakness.  Hematological: Negative for adenopathy.  Psychiatric/Behavioral: Negative for confusion.     Physical Exam Updated Vital Signs BP 110/76 (BP Location: Right Arm)   Pulse 77   Temp 97.8 F (36.6 C) (Oral)   Resp 20   Ht 5' 11"  (1.803 m)   Wt 88.3 kg (194 lb 10.7 oz)   SpO2 95%   BMI 27.15 kg/m   Physical Exam  Constitutional: He appears well-developed.  HENT:  Head: Normocephalic.  Neck: Neck  supple.  Mild JVD.  Cardiovascular: Regular rhythm.  Pulmonary/Chest:  Mildly harsh breath sounds with rales bilateral bases.  Abdominal: Soft. There is no tenderness.  Musculoskeletal:       Right lower leg: He exhibits edema.       Left lower leg: He exhibits edema.  Mild pitting edema bilateral lower extremities.  Neurological: He is alert.  Skin: Capillary refill takes less than 2 seconds.     ED Treatments / Results  Labs (all labs ordered are listed, but only abnormal results are displayed) Labs Reviewed  TROPONIN I - Abnormal; Notable for the following components:      Result Value   Troponin I 0.25 (*)    All other components within normal limits  BRAIN NATRIURETIC PEPTIDE - Abnormal; Notable for the following components:   B Natriuretic Peptide 420.9 (*)    All other components within normal limits  COMPREHENSIVE METABOLIC PANEL - Abnormal; Notable for the following components:   Potassium 3.3 (*)    Chloride 100 (*)    Glucose, Bld 105 (*)    BUN 35 (*)    Creatinine, Ser 1.61 (*)    AST 44 (*)    Alkaline Phosphatase 215 (*)    Total Bilirubin 1.3 (*)    GFR calc non Af Amer 40 (*)    GFR calc Af Amer 46 (*)    All other components within normal limits  CBC WITH DIFFERENTIAL/PLATELET - Abnormal; Notable for the following components:   Hemoglobin 12.2 (*)    HCT 38.6 (*)     RDW 21.9 (*)    Monocytes Absolute 1.1 (*)    All other components within normal limits    EKG  EKG Interpretation  Date/Time:  Wednesday June 25 2017 11:37:46 EDT Ventricular Rate:  71 PR Interval:  204 QRS Duration: 164 QT Interval:  474 QTC Calculation: 515 R Axis:   -82 Text Interpretation:  Normal sinus rhythm Left axis deviation Right bundle branch block Inferior infarct , age undetermined Anterolateral infarct , age undetermined Abnormal ECG No significant change since last tracing Confirmed by Davonna Belling [phone]) on 06/25/2017 12:13:38 PM       Radiology Dg Chest 2 View  Result Date: 06/25/2017 CLINICAL DATA:  Progressively worsening shortness of breath. EXAM: CHEST - 2 VIEW COMPARISON:  03/27/2017 FINDINGS: Cardiomegaly as seen previously. Aortic atherosclerosis. Bilateral pleural effusions layering dependently with dependent pulmonary atelectasis. Upper lungs are clear. No acute bone finding. IMPRESSION: Cardiomegaly, aortic atherosclerosis, pleural effusions and basilar atelectasis. Findings most consistent with chronic congestive heart failure. Electronically Signed   By: Nelson Chimes M.D.   On: 06/25/2017 12:03    Procedures Procedures (including critical care time)  Medications Ordered in ED Medications - No data to display   Initial Impression / Assessment and Plan / ED Course  I have reviewed the triage vital signs and the nursing notes.  Pertinent labs & imaging results that were available during my care of the patient were reviewed by me and considered in my medical decision making (see chart for details).     Patient with shortness of breath.  BNP elevated.  X-ray shows CHF.  Patient's weight is up from previous MD visit.  Troponin elevated but has been at this level with last 2 of CHF admissions.  Worsening able to tolerate exercise.  Will admit to hospitalist for CHF since that is who the last 2 admissions went to.  Final Clinical Impressions(s) /  ED Diagnoses  Final diagnoses:  Acute on chronic congestive heart failure, unspecified heart failure type [facility])    ED Discharge Orders    None       Davonna Belling, MD 06/25/17 1450

## 2017-06-25 NOTE — Telephone Encounter (Signed)
Incoming call from the daughter for the patient. He has progressively gotten short of breath over the past several days. He is unable to sleep and has to sit up in the recliner due to not being able to breath. The daughter stated that he walks 10 feet and is short of breath. She was calling to state that they will be taking him to the [facility] ED for further evaluation.

## 2017-06-26 ENCOUNTER — Encounter (HOSPITAL_COMMUNITY): Payer: Self-pay | Admitting: Physician Assistant

## 2017-06-26 ENCOUNTER — Inpatient Hospital Stay (HOSPITAL_COMMUNITY): Payer: Medicare HMO

## 2017-06-26 DIAGNOSIS — J90 Pleural effusion, not elsewhere classified: Secondary | ICD-10-CM

## 2017-06-26 DIAGNOSIS — I509 Heart failure, unspecified: Secondary | ICD-10-CM

## 2017-06-26 DIAGNOSIS — N183 Chronic kidney disease, stage 3 (moderate): Secondary | ICD-10-CM

## 2017-06-26 DIAGNOSIS — R748 Abnormal levels of other serum enzymes: Secondary | ICD-10-CM

## 2017-06-26 DIAGNOSIS — I10 Essential (primary) hypertension: Secondary | ICD-10-CM

## 2017-06-26 DIAGNOSIS — I428 Other cardiomyopathies: Secondary | ICD-10-CM

## 2017-06-26 LAB — ECHOCARDIOGRAM COMPLETE
Height: 71 in
WEIGHTICAEL: 2982.4 [oz_av]

## 2017-06-26 LAB — GLUCOSE, CAPILLARY
GLUCOSE-CAPILLARY: 113 mg/dL — AB (ref 65–99)
GLUCOSE-CAPILLARY: 175 mg/dL — AB (ref 65–99)
GLUCOSE-CAPILLARY: 137 mg/dL — AB (ref 65–99)
Glucose-Capillary: 114 mg/dL — ABNORMAL HIGH (ref 65–99)

## 2017-06-26 LAB — CBC
HEMATOCRIT: 38 % — AB (ref 39.0–52.0)
Hemoglobin: 12.1 g/dL — ABNORMAL LOW (ref 13.0–17.0)
MCH: 27.7 pg (ref 26.0–34.0)
MCHC: 31.8 g/dL (ref 30.0–36.0)
MCV: 87 fL (ref 78.0–100.0)
Platelets: 163 10*3/uL (ref 150–400)
RBC: 4.37 MIL/uL (ref 4.22–5.81)
RDW: 21.1 % — ABNORMAL HIGH (ref 11.5–15.5)
WBC: 8 10*3/uL (ref 4.0–10.5)

## 2017-06-26 LAB — COMPREHENSIVE METABOLIC PANEL
ALT: 30 U/L (ref 17–63)
AST: 38 U/L (ref 15–41)
Albumin: 3.5 g/dL (ref 3.5–5.0)
Alkaline Phosphatase: 190 U/L — ABNORMAL HIGH (ref 38–126)
Anion gap: 13 (ref 5–15)
BUN: 28 mg/dL — ABNORMAL HIGH (ref 6–20)
CHLORIDE: 103 mmol/L (ref 101–111)
CO2: 27 mmol/L (ref 22–32)
CREATININE: 1.62 mg/dL — AB (ref 0.61–1.24)
Calcium: 9.1 mg/dL (ref 8.9–10.3)
GFR calc Af Amer: 46 mL/min — ABNORMAL LOW (ref >60)
GFR, EST NON AFRICAN AMERICAN: 39 mL/min — AB (ref >60)
Glucose, Bld: 116 mg/dL — ABNORMAL HIGH (ref 65–99)
POTASSIUM: 3.7 mmol/L (ref 3.5–5.1)
SODIUM: 143 mmol/L (ref 135–145)
Total Bilirubin: 1.6 mg/dL — ABNORMAL HIGH (ref 0.3–1.2)
Total Protein: 6.7 g/dL (ref 6.5–8.1)

## 2017-06-26 LAB — TROPONIN I
TROPONIN I: 0.34 ng/mL — AB (ref <0.03)
Troponin I: 0.29 ng/mL (ref <0.03)

## 2017-06-26 LAB — PHOSPHORUS: PHOSPHORUS: 3.3 mg/dL (ref 2.5–4.6)

## 2017-06-26 LAB — TSH: TSH: 2.456 u[IU]/mL (ref 0.350–4.500)

## 2017-06-26 LAB — MAGNESIUM: Magnesium: 1.7 mg/dL (ref 1.7–2.4)

## 2017-06-26 MED ORDER — SPIRONOLACTONE 25 MG PO TABS: 25.0000 mg | ORAL_TABLET | Freq: Every day | ORAL | Status: DC

## 2017-06-26 MED ORDER — MAGNESIUM SULFATE IN D5W 1-5 GM/100ML-% IV SOLN
1.0000 g | Freq: Once | INTRAVENOUS | Status: AC
  Administered 2017-06-26: 1 g via INTRAVENOUS
  Filled 2017-06-26: qty 100

## 2017-06-26 MED ORDER — POTASSIUM CHLORIDE CRYS ER 20 MEQ PO TBCR
20.0000 meq | EXTENDED_RELEASE_TABLET | Freq: Every day | ORAL | Status: DC
  Administered 2017-06-26 – 2017-06-27 (×3): 20 meq via ORAL
  Filled 2017-06-26 (×2): qty 1

## 2017-06-26 NOTE — Care Management Note (Signed)
Case Management Note  Patient Details  Name: JEDEDIAH NODA MRN: [id number] Date of Birth: [birth year]  Subjective/Objective:         CHF          Action/Plan: Patient lives at home,PCP: [name], MD; has private insurance with [facility] Medicare with prescription drug coverage; CM following for progression of care.   Expected Discharge Date:    possibly 06/30/2017              Expected Discharge Plan:  [place] possibly  In-House Referral:   [facility]  Discharge planning Services  CM Consult  Status of Service:  In process, will continue to follow  Sherrilyn Rist [phone] 06/26/2017, 11:31 AM

## 2017-06-26 NOTE — Plan of Care (Signed)
  Clinical Measurements: Diagnostic test results will improve 06/26/2017 2354 - Progressing by Kaetlyn Noa A, RN   Activity: Risk for activity intolerance will decrease 06/26/2017 2354 - Progressing by Finnean Cerami A, RN   Nutrition: Adequate nutrition will be maintained 06/26/2017 2354 - Progressing by Bernice Mullin, Roma Kayser, RN

## 2017-06-26 NOTE — Consult Note (Addendum)
Cardiology Consultation:   Patient ID: CEBERT DETTMANN; [id number]; Mar 12, 1941   Admit date: 06/25/2017 Date of Consult: 06/26/2017  Primary Care Provider: [name], MD Primary Cardiologist: Dr. Ellyn Hack  Chief Complaint: CHF  Patient Profile:   Dillon Andrews is a 77 y.o. male with a hx of chronic combined CHF, NICM, tricuspid regurgitation, mitral regurgitation, CKD III, presumed NASH (per pt, never bx proven), esophageal varices per chart (prior glass of wine occasionally but no heavy alcohol), HTN, HLD, DM, Barrett's esophagus, GERD, anemia, RBBB who is being seen today for the evaluation of shortness of breath at the request of Dr. Roel Cluck.  History of Present Illness:   The patient was previously followed by cardiologist Dr. Clydie Braun. Scanned records from OSH indicate in 09/2016 he was admitted for dyspnea, progressive over several months' time. He had a left heart cath showing mild nonbstructive CAD (30% Cx) with EF 40-45%. RHC showed unremarkable right heart pressure except Peak "V" wave noted with a wedge of 19. A TEE was done that showed moderate MR and moderate TR. CT was negative for PE but did show large pleural effusions with superimposed mild pulmonary edema and associated compressive atelectasis. No other acute pulmonary findings noted. He required thoracentesis that admission. Discharge summary does not reference any pathology obtained and there are no pleural fluid analyses in Care Everywhere. He also had NSVT and atrial tachycardia so was started on amiodarone. Cardiology records stated that when seen in follow-up by prior cardiologist 03/2017 the patient was doing well without further arrhythmias on monitor so amiodarone was discontinued. In 11/2016 he was admitted for some post-op cholecystectomy issues and had a VQ scan that was negative for PE.   The diagnosis of cirrhosis is unclear as his abd [place] in 11/2016 showed normal liver contours; our hospital's GI  note at that time indicated vague hx as well. There is note from outside clinic referencing an EGD from 2016 showing two columns of small esophageal varices without stigmata with recommendation for surveillance of such. The patient reports his liver enzymes have been elevated for 40 years so the presumption has been cirrhosis but they had decided to watch it rather than biopsy it.  He was admitted 12/13-12/16/18 in our system for CHF at which time echo showed EF 40-45%, grade 2 DD, mild MR, mild LAE, mildly dilated RV, mod TR, mildly increased PASP, left pleural effusion. At time of DC 03/2018 he had a weight recorded of 179 but this was discordant from the weight the day before of 188 so it's unclear how accurate this was (more recent office weight was 186)l. He states he felt "the best he's felt in a long time" on the day of that discharge but within days of leaving the hospital, went right back to feeling the same chronic SOB he's felt since before June. He was seen back in f/u last 04/2017 by Dr. Ellyn Hack to transfer care as Dr. Ellyn Hack follows the patient's daughter. He was felt to be doing relatively OK and was started on sprionolactone. F/u OP BMET 2/28 showed AKI on CKD with Cr 1.82 and K 5.4 so Dr. Ellyn Hack recommended to hold KCl/spironolactone and reduce Lasix for 3 days then resume normal dose. Repeat BMET was planned but in the meantime he was readmitted. He has since gone back to taking 23m of Lasix daily. He called the office yesterday c/o significantly worsening dyspnea superimposed on recently chronic SOB and was advised to go to ED. Labs notable for  K 3.3->3.7, Cr 1.61 (similar to baseline), troponins flat (0.25-0.27-0.29-0.24), BNP 420, Hgb 12.2, TSH wnl. CXR with Cardiomegaly, aortic atherosclerosis, pleural effusions and basilar atelectasis, findings most consistent with chronic congestive heart failure. Admit weight was 194, down to 186lb today. He reports excellent UOP thus far, but has not  noticed significant difference in his dyspnea which he states is even there when lying in bed doing nothing at all. No chest pain, cough, fever, chills. He reports intermittent ankle edema but no significant swelling recently. In December he recalls having abdominal distention that did not recur this admission.  06/26/17 I was asked to see [name] by the cardiology service. There are two essential questions.Why is the patient so dyspneic and what is the etiology of his pleural effusions. Certainly the patient has known biventricular CHF. He has a LVEF of 40-45% by his most recent ECHO.Last troponin was 0.34. BNP was 421 yesterday. The patient complains of reduced exercise capacity over thel ast several months.he cannot climb stairs ad has to stop after walking 100-200'. He has PND as well. He reportedly had a cardiac cath 7/18 that showed no significant obstructive disease.  He has relatively mild CKD (tage 3). He has a vague history of NASH. He denies significant alcohol history.He used to work  in Teacher, adult education care. He was never told he had a significant lung problkem and denies any history of thromboembolic disease. He has no history of cancer. His weight has been stable latlely.     Past Medical History:  Diagnosis Date  . Anemia 11/2016  . Arthritis    "some; hands/fingers" (06/25/2017)  . Atrial tachycardia ([place])   . Barrett's esophagus   . Basal cell carcinoma    "face, arms, hands" (06/25/2017)  . Chicken pox   . Cholelithiasis 11/2016   S/P LAP CHOLE  . Chronic combined systolic and diastolic CHF (congestive heart failure) ([place])   . CKD (chronic kidney disease), stage III ([place])   . Esophageal varices (HCC)    Secondary to NAFLD  . Family history of adverse reaction to anesthesia    "daughter's sat drop very low; last OR had to be reintubated" (06/25/2017)  . GERD (gastroesophageal reflux disease)   . High cholesterol   . Hypertension   . Mitral regurgitation   . NAFLD  (nonalcoholic fatty liver disease)   . NICM (nonischemic cardiomyopathy) ([place])   . NSVT (nonsustained ventricular tachycardia) ([place])   . Pneumonia 09/2016  . RBBB   . Tricuspid regurgitation   . Type II diabetes mellitus ([place])     Past Surgical History:  Procedure Laterality Date  . BACK SURGERY    . BASAL CELL CARCINOMA EXCISION Right    temple; "had to do skin graft"  . CARDIAC CATHETERIZATION  09/2016   Minimal, non-occlusive CAD, EF 45% [facility])  . CARPAL TUNNEL RELEASE Bilateral   . CATARACT EXTRACTION W/ INTRAOCULAR LENS  IMPLANT, BILATERAL Bilateral   . CHOLECYSTECTOMY N/A 11/22/2016   Procedure: LAPAROSCOPIC CHOLECYSTECTOMY;  Surgeon: Coralie Keens, MD;  Location: [place];  Service: General;  Laterality: N/A;  . JOINT REPLACEMENT    . LUMBAR DISC SURGERY  X 2  . TONSILLECTOMY AND ADENOIDECTOMY    . TOTAL KNEE ARTHROPLASTY Left 2010  . TRANSESOPHAGEAL ECHOCARDIOGRAM  09/2016   Mild global LV hypokinesis. Moderate MR. Moderate TR. [facility])  . TRANSTHORACIC ECHOCARDIOGRAM  10/2016   [facility]) EF 35-40% with moderate HK. Normal RV function. Moderately severe TR.  Marland Kitchen  TRANSTHORACIC ECHOCARDIOGRAM  03/2017    [facility]) mild concentric LVH. Moderately reduced EF of 40-45%. Diffuse HK with no regional wall motion abnormality. GR 2 DD. Septal dyssynergy. Mild MR, moderate TR. Severe RA dilation..     Inpatient Medications: Scheduled Meds: . aspirin EC  81 mg Oral Daily  . carvedilol  6.25 mg Oral BID WC  . enoxaparin (LOVENOX) injection  40 mg Subcutaneous Q24H  . furosemide  60 mg Intravenous Q12H  . gabapentin  800 mg Oral QHS  . insulin aspart  0-15 Units Subcutaneous TID WC  . insulin aspart  0-5 Units Subcutaneous QHS  . mirtazapine  7.5 mg Oral QHS  . pantoprazole  40 mg Oral Daily  . potassium chloride  20 mEq Oral Daily  . rosuvastatin  10 mg Oral Daily  . sodium chloride flush  3 mL Intravenous Q12H   Continuous  Infusions: . sodium chloride    . magnesium sulfate 1 - 4 g bolus IVPB     PRN Meds: sodium chloride, acetaminophen **OR** acetaminophen, HYDROcodone-acetaminophen, ondansetron **OR** ondansetron (ZOFRAN) IV, sodium chloride flush  Home Meds: Prior to Admission medications   Medication Sig Start Date End Date Taking? Authorizing Provider  aspirin EC 81 MG tablet Take 81 mg by mouth daily. 12/23/14  Yes [provider]  carvedilol (COREG) 6.25 MG tablet Take 1 tablet (6.25 mg total) by mouth 2 (two) times daily with a meal. 04/29/17  Yes Leonie Man, MD  furosemide (LASIX) 80 MG tablet Take 80 mg tablet  In the morning, may take an extra 40 mg to 80 mg daily depend on weight gain 05/12/17  Yes Leonie Man, MD  gabapentin (NEURONTIN) 800 MG tablet Take 1 tablet (800 mg total) by mouth at bedtime. 03/05/17  Yes [name], MD  glipiZIDE (GLUCOTROL) 5 MG tablet Take 2.5 mg by mouth 2 (two) times daily. 09/21/16  Yes [provider]  mirtazapine (REMERON) 7.5 MG tablet Take 1 tablet (7.5 mg total) by mouth at bedtime. 06/06/17  Yes [name], MD  potassium chloride SA (K-DUR,KLOR-CON) 20 MEQ tablet Take 20 mEq by mouth daily.   Yes [provider]  rosuvastatin (CRESTOR) 10 MG tablet Take 10 mg by mouth daily.   Yes [provider]  spironolactone (ALDACTONE) 25 MG tablet Take 1 tablet (25 mg total) by mouth daily. 05/12/17 08/10/17 Yes Leonie Man, MD  vitamin B-12 (CYANOCOBALAMIN) 500 MCG tablet Take 500 mcg by mouth every other day.   Yes [provider]  glucose blood test strip Use as instructed 03/05/17   [name], MD  omeprazole (PRILOSEC) 40 MG capsule Take 40 mg by mouth daily.    [provider]    Allergies:    Allergies  Allergen Reactions  . Spironolactone     After initiation in 04/2017, had hyperkalemia on follow-up labwork so FYI    Social History:   Social History    Socioeconomic History  . Marital status: Married    Spouse name: Not on file  . Number of children: Not on file  . Years of education: Not on file  . Highest education level: Not on file  Social Needs  . Financial resource strain: Not on file  . Food insecurity - worry: Not on file  . Food insecurity - inability: Not on file  . Transportation needs - medical: Not on file  . Transportation needs - non-medical: Not on file  Occupational History  .  Not on file  Tobacco Use  . Smoking status: Former Smoker    Packs/day: 0.30    Years: 15.00    Pack years: 4.50    Types: Cigarettes    Last attempt to quit: 03/17/1977    Years since quitting: 40.3  . Smokeless tobacco: Never Used  Substance and Sexual Activity  . Alcohol use: Yes    Comment: 06/25/2017 "nothing since 09/2016;  used to have glass of wine q hs"  . Drug use: No  . Sexual activity: Not on file  Other Topics Concern  . Not on file  Social History Narrative   Accompanied by his daughter --> also a patient of Dr. Ellyn Hack    Family History:   The patient's family history includes Breast cancer in his sister; Cancer in his father; Cataracts in his father. There is no history of Cirrhosis.  ROS:  Please see the history of present illness. All other ROS reviewed and negative.     Physical Exam/Data:   Vitals:   06/26/17 0100 06/26/17 0657 06/26/17 0732 06/26/17 1130  BP: 121/76 120/67 116/72 111/70  Pulse: 78 80 79 74  Resp: 16  16 16   Temp: 97.6 F (36.4 C) 99 F (37.2 C)  98.1 F (36.7 C)  TempSrc: Oral Oral Oral Oral  SpO2: 94% 90% 95% 95%  Weight:  186 lb 6.4 oz (84.6 kg)    Height:        Intake/Output Summary (Last 24 hours) at 06/26/2017 1542 Last data filed at 06/26/2017 1512 Gross per 24 hour  Intake 1060 ml  Output 2722 ml  Net -1662 ml   Filed Weights   06/25/17 1122 06/25/17 1659 06/26/17 0657  Weight: 194 lb 10.7 oz (88.3 kg) 194 lb (88 kg) 186 lb 6.4 oz (84.6 kg)   Body mass index is 26  kg/m.  General: Well developed, well nourished WM, in no acute distress, cheerful Head: Normocephalic, atraumatic, sclera non-icteric, no xanthomas, nares are without discharge.  Neck: Negative for carotid bruits. JVD not elevated. Lungs: Diminished BS bilaterally 1/3 of the way up without wheezes, rales or rhonchi. Breathing is unlabored. Heart: RRR with S1 S2. No murmurs, rubs, or gallops appreciated. Abdomen: Soft, non-tender, non-distended with normoactive bowel sounds. No hepatomegaly. No rebound/guarding. No obvious abdominal masses. Msk:  Strength and tone appear normal for age. Extremities: No clubbing or cyanosis. No edema.  Distal pedal pulses are 2+ and equal bilaterally. Neuro: Alert and oriented X 3. No facial asymmetry. No focal deficit. Moves all extremities spontaneously. Psych:  Responds to questions appropriately with a normal affect.  EKG:  The EKG was personally reviewed and demonstrates NSR 1st degree AVB + RBBB, prior inferior and anterior infarct, no acute changes  Relevant CV Studies: As above.  Laboratory Data:  Chemistry Recent Labs  Lab 06/25/17 1207 06/26/17 0738  NA 141 143  K 3.3* 3.7  CL 100* 103  CO2 31 27  GLUCOSE 105* 116*  BUN 35* 28*  CREATININE 1.61* 1.62*  CALCIUM 8.9 9.1  GFRNONAA 40* 39*  GFRAA 46* 46*  ANIONGAP 10 13    Recent Labs  Lab 06/25/17 1207 06/26/17 0738  PROT 7.1 6.7  ALBUMIN 3.6 3.5  AST 44* 38  ALT 32 30  ALKPHOS 215* 190*  BILITOT 1.3* 1.6*   Hematology Recent Labs  Lab 06/25/17 1207 06/26/17 0738  WBC 7.3 8.0  RBC 4.51 4.37  HGB 12.2* 12.1*  HCT 38.6* 38.0*  MCV 85.6 87.0  MCH 27.1 27.7  MCHC 31.6 31.8  RDW 21.9* 21.1*  PLT 169 163   Cardiac Enzymes Recent Labs  Lab 06/25/17 1207 06/25/17 2045 06/26/17 0252 06/26/17 0738  TROPONINI 0.25* 0.27* 0.29* 0.34*   No results for input(s): TROPIPOC in the last 168 hours.  BNP Recent Labs  Lab 06/25/17 1207  BNP 420.9*    DDimer No results for  input(s): DDIMER in the last 168 hours.  Radiology/Studies:  Dg Chest 2 View  Result Date: 06/25/2017 CLINICAL DATA:  Progressively worsening shortness of breath. EXAM: CHEST - 2 VIEW COMPARISON:  03/27/2017 FINDINGS: Cardiomegaly as seen previously. Aortic atherosclerosis. Bilateral pleural effusions layering dependently with dependent pulmonary atelectasis. Upper lungs are clear. No acute bone finding. IMPRESSION: Cardiomegaly, aortic atherosclerosis, pleural effusions and basilar atelectasis. Findings most consistent with chronic congestive heart failure. Electronically Signed   By: Nelson Chimes M.D.   On: 06/25/2017 12:03    Assessment and Plan:   1. Recurrent bilateral pleural effusions,.    The patient has and has had bilateral pleural effusions. They are moderate in size.He had a large right sided thoracentesis in the middle of last year which helped him. I think the most likely cause for the effusions is hear t failure even though the effusions would not ordinarily be expected with his degree of cardiac dysfn. It dopes  Not appear on the surfac ethat the patient has a cardiorenal syndrome as again the symptoms are out of proportion to the degree of renal dysfn. There is little in the history to suggest a connective tissue disease. There is a possibility of amyloid disease but that would be most consistent with restrictive pathology on his ECHO. Whatever the cause of his pleural effusion it would probabbly be reasonable at this time to tap at least one of the effusions while he is in the hospital. His vague history of liver disease and lack of serious alcohol intake go against him haviing a significant hepatopulmonary syndrome. At some point of the fluid. I would sned the fluid for rheumatoid factor,ANA, MTB   Does  ot popint ot an etiology and infx has been ruled out to try thepatient on a trial of prednison for 4-6 weeks to see if that has has beneficial effect.  Dyspnea Hard to know  to what extent the ffusions alone are causing hsi CHF. I believe it would be reasonable to get some PFTs on the patient. I think it may be reasonable to order a V/Q scan as well looking for evidence of chronic thromboembolic disease. We will follow thepatient with you  3. NSVT -  History of such. 2 very brief runs of NSVT noted on telemetry thus far. Continue beta blocker. Replete K to goal 4.0 (will give 75mq daily) and Mg to goal 2.0 (rx 1g Mag Sulfate). F/u lytes in AM.  4. CKD stage III - watch Cr with diuresis. Appears near baseline.  For questions or updates, please contact [place]Please consult [email or web address] for contact info under Cardiology/STEMI.   Signed, RTarry Kos MD  06/26/2017 3:42 PM

## 2017-06-26 NOTE — Progress Notes (Signed)
  Echocardiogram 2D Echocardiogram has been performed.  Dillon Andrews 06/26/2017, 12:33 PM

## 2017-06-26 NOTE — Progress Notes (Signed)
PROGRESS NOTE    Dillon Andrews  TKP:[id number] DOB: Mar 17, 1941 DOA: 06/25/2017 PCP: [name], MD     Brief Narrative:  Dillon Andrews is a 77 yo male with past medical history significant for combined systolic, diastolic heart failure, chronic kidney disease stage III, type 2 diabetes, esophageal varices secondary to cirrhosis secondary to [facility], GERD, hypertension, hyperlipidemia.  He presents with 2-3-day history of worsening shortness of breath, especially with exertion.  His daughter called cardiologist who recommended patient to be evaluated.  He went to [place].  Chest x-ray revealed cardiomegaly, pleural effusions and basilar atelectasis.  He was transferred to [facility] for further evaluation and treatment of acute CHF.  Assessment & Plan:   Principal Problem:   CHF exacerbation ([place]) Active Problems:   CKD (chronic kidney disease) stage 3, GFR 30-59 ml/min (HCC)   Elevated troponin   Restless leg syndrome   Type II diabetes mellitus (HCC)   Essential hypertension   Non-ischemic cardiomyopathy (HCC)   Hypokalemia  Acute on chronic systolic and diastolic heart failure -BNP 420.9 -Cardiology consulted -Continue Lasix 60 mg every 12 hours -Strict I's and O's, daily weight -Echo cardiogram pending -Continue Coreg  Chronically elevated troponin -Previously ranges from 0.2-0.3  -Flat in trend and not consistent with ACS   Cirrhosis secondary to NASH, with esophageal varices -Aldactone has been on hold since 3/4 per Dr. Ellyn Hack due to Cr 1.82 and K 5.4. Resume today. Lasix as above   Chronic kidney disease stage III -Baseline creatinine 1.5 -Stable   Type 2 diabetes with peripheral neuropathy -SSI  -Continue neurontin   GERD -Continue PPI  HLD -Continue crestor    DVT prophylaxis: Lovenox Code Status: Full code Family Communication: No family at bedside Disposition Plan: Pending clinical improvement, cardiology  consultation, echocardiogram result   Consultants:   Cardiology  Procedures:   None   Antimicrobials:  Anti-infectives (From admission, onward)   None       Subjective: Complains of shortness of breath especially with exertion, bending at the hips.  Breathing is more stable at rest.  Denies any chest pain.  He did have some ankle swelling yesterday which is now resolved.  Objective: Vitals:   06/26/17 0100 06/26/17 0657 06/26/17 0732 06/26/17 1130  BP: 121/76 120/67 116/72 111/70  Pulse: 78 80 79 74  Resp: 16  16 16   Temp: 97.6 F (36.4 C) 99 F (37.2 C)  98.1 F (36.7 C)  TempSrc: Oral Oral Oral Oral  SpO2: 94% 90% 95% 95%  Weight:  84.6 kg (186 lb 6.4 oz)    Height:        Intake/Output Summary (Last 24 hours) at 06/26/2017 1403 Last data filed at 06/26/2017 1301 Gross per 24 hour  Intake 720 ml  Output 2572 ml  Net -1852 ml   Filed Weights   06/25/17 1122 06/25/17 1659 06/26/17 0657  Weight: 88.3 kg (194 lb 10.7 oz) 88 kg (194 lb) 84.6 kg (186 lb 6.4 oz)    Examination:  General exam: Appears calm and comfortable  Respiratory system: Clear to auscultation. Respiratory effort normal. Cardiovascular system: S1 & S2 heard, RRR. No JVD, murmurs, rubs, gallops or clicks. No pedal edema. Gastrointestinal system: Abdomen is nondistended, soft and nontender. No organomegaly or masses felt. Normal bowel sounds heard. Central nervous system: Alert and oriented. No focal neurological deficits. Extremities: Symmetric 5 x 5 power. Skin: No rashes, lesions or ulcers Psychiatry: Judgement and insight appear normal.  Mood & affect appropriate.   Data Reviewed: I have personally reviewed following labs and imaging studies  CBC: Recent Labs  Lab 06/25/17 1207 06/26/17 0738  WBC 7.3 8.0  NEUTROABS 4.8  --   HGB 12.2* 12.1*  HCT 38.6* 38.0*  MCV 85.6 87.0  PLT 169 102   Basic Metabolic Panel: Recent Labs  Lab 06/25/17 1207 06/26/17 0738  NA 141 143  K 3.3*  3.7  CL 100* 103  CO2 31 27  GLUCOSE 105* 116*  BUN 35* 28*  CREATININE 1.61* 1.62*  CALCIUM 8.9 9.1  MG  --  1.7  PHOS  --  3.3   GFR: Estimated Creatinine Clearance: 40.7 mL/min (A) (by C-G formula based on SCr of 1.62 mg/dL (H)). Liver Function Tests: Recent Labs  Lab 06/25/17 1207 06/26/17 0738  AST 44* 38  ALT 32 30  ALKPHOS 215* 190*  BILITOT 1.3* 1.6*  PROT 7.1 6.7  ALBUMIN 3.6 3.5   No results for input(s): LIPASE, AMYLASE in the last 168 hours. No results for input(s): AMMONIA in the last 168 hours. Coagulation Profile: No results for input(s): INR, PROTIME in the last 168 hours. Cardiac Enzymes: Recent Labs  Lab 06/25/17 1207 06/25/17 2045 06/26/17 0252 06/26/17 0738  TROPONINI 0.25* 0.27* 0.29* 0.34*   BNP (last 3 results) No results for input(s): PROBNP in the last 8760 hours. HbA1C: No results for input(s): HGBA1C in the last 72 hours. CBG: Recent Labs  Lab 06/25/17 1705 06/25/17 2200 06/26/17 0731 06/26/17 1128  GLUCAP 197* 147* 114* 113*   Lipid Profile: No results for input(s): CHOL, HDL, LDLCALC, TRIG, CHOLHDL, LDLDIRECT in the last 72 hours. Thyroid Function Tests: Recent Labs    06/26/17 0252  TSH 2.456   Anemia Panel: No results for input(s): VITAMINB12, FOLATE, FERRITIN, TIBC, IRON, RETICCTPCT in the last 72 hours. Sepsis Labs: No results for input(s): PROCALCITON, LATICACIDVEN in the last 168 hours.  No results found for this or any previous visit (from the past 240 hour(s)).     Radiology Studies: Dg Chest 2 View  Result Date: 06/25/2017 CLINICAL DATA:  Progressively worsening shortness of breath. EXAM: CHEST - 2 VIEW COMPARISON:  03/27/2017 FINDINGS: Cardiomegaly as seen previously. Aortic atherosclerosis. Bilateral pleural effusions layering dependently with dependent pulmonary atelectasis. Upper lungs are clear. No acute bone finding. IMPRESSION: Cardiomegaly, aortic atherosclerosis, pleural effusions and basilar  atelectasis. Findings most consistent with chronic congestive heart failure. Electronically Signed   By: Nelson Chimes M.D.   On: 06/25/2017 12:03      Scheduled Meds: . aspirin EC  81 mg Oral Daily  . carvedilol  6.25 mg Oral BID WC  . enoxaparin (LOVENOX) injection  40 mg Subcutaneous Q24H  . furosemide  60 mg Intravenous Q12H  . gabapentin  800 mg Oral QHS  . insulin aspart  0-15 Units Subcutaneous TID WC  . insulin aspart  0-5 Units Subcutaneous QHS  . mirtazapine  7.5 mg Oral QHS  . pantoprazole  40 mg Oral Daily  . potassium chloride  20 mEq Oral Daily  . rosuvastatin  10 mg Oral Daily  . sodium chloride flush  3 mL Intravenous Q12H   Continuous Infusions: . sodium chloride    . magnesium sulfate 1 - 4 g bolus IVPB       LOS: 1 day    Time spent: 25 minutes   Dessa Phi, DO [facility] [email or web address] Password [facility] 06/26/2017, 2:03 PM

## 2017-06-26 NOTE — Plan of Care (Signed)
  Clinical Measurements: Diagnostic test results will improve 06/26/2017 0154 - Progressing by Skippy Marhefka A, RN   Activity: Capacity to carry out activities will improve 06/26/2017 0154 - Progressing by Franchesca Veneziano A, RN   Cardiac: Ability to achieve and maintain adequate cardiopulmonary perfusion will improve 06/26/2017 0154 - Progressing by Anaston Koehn, Roma Kayser, RN

## 2017-06-26 NOTE — Progress Notes (Signed)
SATURATION QUALIFICATIONS: (This note is used to comply with regulatory documentation for home oxygen)  Patient Saturations on Room Air at Rest = 97 %  Patient Saturations on Room Air while Ambulating = 95-98%  Patient Saturations on 0 Liters of oxygen while Ambulating = 0% Pt ambulated 469f, denies sob, no doe noted hr 70's at rest and 98 with walking

## 2017-06-26 NOTE — Consult Note (Addendum)
Cardiology Consultation:   Patient ID: Dillon Andrews; [id number]; 1940/05/28   Admit date: 06/25/2017 Date of Consult: 06/26/2017  Primary Care Provider: [name], MD Primary Cardiologist: Dr. Ellyn Hack  Chief Complaint: CHF  Patient Profile:   Dillon Andrews is a 77 y.o. male with a hx of chronic combined CHF, NICM, tricuspid regurgitation, mitral regurgitation, CKD III, presumed NASH (per pt, never bx proven), esophageal varices per chart (prior glass of wine occasionally but no heavy alcohol), HTN, HLD, DM, Barrett's esophagus, GERD, anemia, RBBB who is being seen today for the evaluation of shortness of breath at the request of Dr. Roel Cluck.  History of Present Illness:   The patient was previously followed by cardiologist Dr. Clydie Braun. Scanned records from OSH indicate in 09/2016 he was admitted for dyspnea, progressive over several months' time. He had a left heart cath showing mild nonbstructive CAD (30% Cx) with EF 40-45%. RHC showed unremarkable right heart pressure except Peak "V" wave noted with a wedge of 19. A TEE was done that showed moderate MR and moderate TR. CT was negative for PE but did show large pleural effusions with superimposed mild pulmonary edema and associated compressive atelectasis. No other acute pulmonary findings noted. He required thoracentesis that admission. Discharge summary does not reference any pathology obtained and there are no pleural fluid analyses in Care Everywhere. He also had NSVT and atrial tachycardia so was started on amiodarone. Cardiology records stated that when seen in follow-up by prior cardiologist 03/2017 the patient was doing well without further arrhythmias on monitor so amiodarone was discontinued. In 11/2016 he was admitted for some post-op cholecystectomy issues and had a VQ scan that was negative for PE.   The diagnosis of cirrhosis is unclear as his abd [place] in 11/2016 showed normal liver contours; our hospital's GI  note at that time indicated vague hx as well. There is note from outside clinic referencing an EGD from 2016 showing two columns of small esophageal varices without stigmata with recommendation for surveillance of such. The patient reports his liver enzymes have been elevated for 40 years so the presumption has been cirrhosis but they had decided to watch it rather than biopsy it.  He was admitted 12/13-12/16/18 in our system for CHF at which time echo showed EF 40-45%, grade 2 DD, mild MR, mild LAE, mildly dilated RV, mod TR, mildly increased PASP, left pleural effusion. At time of DC 03/2018 he had a weight recorded of 179 but this was discordant from the weight the day before of 188 so it's unclear how accurate this was (more recent office weight was 186)l. He states he felt "the best he's felt in a long time" on the day of that discharge but within days of leaving the hospital, went right back to feeling the same chronic SOB he's felt since before June. He was seen back in f/u last 04/2017 by Dr. Ellyn Hack to transfer care as Dr. Ellyn Hack follows the patient's daughter. He was felt to be doing relatively OK and was started on sprionolactone. F/u OP BMET 2/28 showed AKI on CKD with Cr 1.82 and K 5.4 so Dr. Ellyn Hack recommended to hold KCl/spironolactone and reduce Lasix for 3 days then resume normal dose. Repeat BMET was planned but in the meantime he was readmitted. He has since gone back to taking 63m of Lasix daily. He called the office yesterday c/o significantly worsening dyspnea superimposed on recently chronic SOB and was advised to go to ED. Labs notable for  K 3.3->3.7, Cr 1.61 (similar to baseline), troponins flat (0.25-0.27-0.29-0.24), BNP 420, Hgb 12.2, TSH wnl. CXR with Cardiomegaly, aortic atherosclerosis, pleural effusions and basilar atelectasis, findings most consistent with chronic congestive heart failure. Admit weight was 194, down to 186lb today. He reports excellent UOP thus far, but has not  noticed significant difference in his dyspnea which he states is even there when lying in bed doing nothing at all. No chest pain, cough, fever, chills. He reports intermittent ankle edema but no significant swelling recently. In December he recalls having abdominal distention that did not recur this admission.  Past Medical History:  Diagnosis Date  . Anemia 11/2016  . Arthritis    "some; hands/fingers" (06/25/2017)  . Atrial tachycardia ([place])   . Barrett's esophagus   . Basal cell carcinoma    "face, arms, hands" (06/25/2017)  . Chicken pox   . Cholelithiasis 11/2016   S/P LAP CHOLE  . Chronic combined systolic and diastolic CHF (congestive heart failure) ([place])   . CKD (chronic kidney disease), stage III ([place])   . Esophageal varices (HCC)    Secondary to NAFLD  . Family history of adverse reaction to anesthesia    "daughter's sat drop very low; last OR had to be reintubated" (06/25/2017)  . GERD (gastroesophageal reflux disease)   . High cholesterol   . Hypertension   . Mitral regurgitation   . NAFLD (nonalcoholic fatty liver disease)   . NICM (nonischemic cardiomyopathy) ([place])   . NSVT (nonsustained ventricular tachycardia) ([place])   . Pneumonia 09/2016  . RBBB   . Tricuspid regurgitation   . Type II diabetes mellitus ([place])     Past Surgical History:  Procedure Laterality Date  . BACK SURGERY    . BASAL CELL CARCINOMA EXCISION Right    temple; "had to do skin graft"  . CARDIAC CATHETERIZATION  09/2016   Minimal, non-occlusive CAD, EF 45% [facility])  . CARPAL TUNNEL RELEASE Bilateral   . CATARACT EXTRACTION W/ INTRAOCULAR LENS  IMPLANT, BILATERAL Bilateral   . CHOLECYSTECTOMY N/A 11/22/2016   Procedure: LAPAROSCOPIC CHOLECYSTECTOMY;  Surgeon: Coralie Keens, MD;  Location: [place];  Service: General;  Laterality: N/A;  . JOINT REPLACEMENT    . LUMBAR DISC SURGERY  X 2  . TONSILLECTOMY AND ADENOIDECTOMY    . TOTAL KNEE ARTHROPLASTY Left 2010  . TRANSESOPHAGEAL  ECHOCARDIOGRAM  09/2016   Mild global LV hypokinesis. Moderate MR. Moderate TR. [facility])  . TRANSTHORACIC ECHOCARDIOGRAM  10/2016   [facility]) EF 35-40% with moderate HK. Normal RV function. Moderately severe TR.  Marland Kitchen TRANSTHORACIC ECHOCARDIOGRAM  03/2017    [facility]) mild concentric LVH. Moderately reduced EF of 40-45%. Diffuse HK with no regional wall motion abnormality. GR 2 DD. Septal dyssynergy. Mild MR, moderate TR. Severe RA dilation..     Inpatient Medications: Scheduled Meds: . aspirin EC  81 mg Oral Daily  . carvedilol  6.25 mg Oral BID WC  . enoxaparin (LOVENOX) injection  40 mg Subcutaneous Q24H  . furosemide  60 mg Intravenous Q12H  . gabapentin  800 mg Oral QHS  . insulin aspart  0-15 Units Subcutaneous TID WC  . insulin aspart  0-5 Units Subcutaneous QHS  . mirtazapine  7.5 mg Oral QHS  . pantoprazole  40 mg Oral Daily  . rosuvastatin  10 mg Oral Daily  . sodium chloride flush  3 mL Intravenous Q12H   Continuous Infusions: . sodium chloride     PRN Meds: sodium  chloride, acetaminophen **OR** acetaminophen, HYDROcodone-acetaminophen, ondansetron **OR** ondansetron (ZOFRAN) IV, sodium chloride flush  Home Meds: Prior to Admission medications   Medication Sig Start Date End Date Taking? Authorizing Provider  aspirin EC 81 MG tablet Take 81 mg by mouth daily. 12/23/14  Yes [provider]  carvedilol (COREG) 6.25 MG tablet Take 1 tablet (6.25 mg total) by mouth 2 (two) times daily with a meal. 04/29/17  Yes Leonie Man, MD  furosemide (LASIX) 80 MG tablet Take 80 mg tablet  In the morning, may take an extra 40 mg to 80 mg daily depend on weight gain 05/12/17  Yes Leonie Man, MD  gabapentin (NEURONTIN) 800 MG tablet Take 1 tablet (800 mg total) by mouth at bedtime. 03/05/17  Yes [name], MD  glipiZIDE (GLUCOTROL) 5 MG tablet Take 2.5 mg by mouth 2 (two) times daily. 09/21/16  Yes [provider]  mirtazapine  (REMERON) 7.5 MG tablet Take 1 tablet (7.5 mg total) by mouth at bedtime. 06/06/17  Yes [name], MD  potassium chloride SA (K-DUR,KLOR-CON) 20 MEQ tablet Take 20 mEq by mouth daily.   Yes [provider]  rosuvastatin (CRESTOR) 10 MG tablet Take 10 mg by mouth daily.   Yes [provider]  spironolactone (ALDACTONE) 25 MG tablet Take 1 tablet (25 mg total) by mouth daily. 05/12/17 08/10/17 Yes Leonie Man, MD  vitamin B-12 (CYANOCOBALAMIN) 500 MCG tablet Take 500 mcg by mouth every other day.   Yes [provider]  glucose blood test strip Use as instructed 03/05/17   [name], MD  omeprazole (PRILOSEC) 40 MG capsule Take 40 mg by mouth daily.    [provider]    Allergies:   No Known Allergies  Social History:   Social History   Socioeconomic History  . Marital status: Married    Spouse name: Not on file  . Number of children: Not on file  . Years of education: Not on file  . Highest education level: Not on file  Social Needs  . Financial resource strain: Not on file  . Food insecurity - worry: Not on file  . Food insecurity - inability: Not on file  . Transportation needs - medical: Not on file  . Transportation needs - non-medical: Not on file  Occupational History  . Not on file  Tobacco Use  . Smoking status: Former Smoker    Packs/day: 0.30    Years: 15.00    Pack years: 4.50    Types: Cigarettes    Last attempt to quit: 03/17/1977    Years since quitting: 40.3  . Smokeless tobacco: Never Used  Substance and Sexual Activity  . Alcohol use: Yes    Comment: 06/25/2017 "nothing since 09/2016;  used to have glass of wine q hs"  . Drug use: No  . Sexual activity: Not on file  Other Topics Concern  . Not on file  Social History Narrative   Accompanied by his daughter --> also a patient of Dr. Ellyn Hack    Family History:   The patient's family history includes Breast cancer in his sister; Cancer in his  father; Cataracts in his father. There is no history of Cirrhosis.  ROS:  Please see the history of present illness. All other ROS reviewed and negative.     Physical Exam/Data:   Vitals:   06/26/17 0100 06/26/17 0657 06/26/17 0732 06/26/17 1130  BP: 121/76 120/67 116/72 111/70  Pulse: 78 80 79  74  Resp: 16  16 16   Temp: 97.6 F (36.4 C) 99 F (37.2 C)  98.1 F (36.7 C)  TempSrc: Oral Oral Oral Oral  SpO2: 94% 90% 95% 95%  Weight:  84.6 kg (186 lb 6.4 oz)    Height:        Intake/Output Summary (Last 24 hours) at 06/26/2017 1351 Last data filed at 06/26/2017 1301 Gross per 24 hour  Intake 720 ml  Output 2572 ml  Net -1852 ml   Filed Weights   06/25/17 1122 06/25/17 1659 06/26/17 0657  Weight: 88.3 kg (194 lb 10.7 oz) 88 kg (194 lb) 84.6 kg (186 lb 6.4 oz)   Body mass index is 26 kg/m.  General: Well developed, well nourished WM, in no acute distress, cheerful Head: Normocephalic, atraumatic, sclera non-icteric, no xanthomas, nares are without discharge.  Neck: Negative for carotid bruits. JVD not elevated. Lungs: Diminished BS bilaterally 1/3 of the way up without wheezes, rales or rhonchi. Breathing is unlabored. Heart: RRR with S1 S2. No murmurs, rubs, or gallops appreciated. Abdomen: Soft, non-tender, non-distended with normoactive bowel sounds. No hepatomegaly. No rebound/guarding. No obvious abdominal masses. Msk:  Strength and tone appear normal for age. Extremities: No clubbing or cyanosis. No edema.  Distal pedal pulses are 2+ and equal bilaterally. Neuro: Alert and oriented X 3. No facial asymmetry. No focal deficit. Moves all extremities spontaneously. Psych:  Responds to questions appropriately with a normal affect.  EKG:  The EKG was personally reviewed and demonstrates NSR 1st degree AVB + RBBB, prior inferior and anterior infarct, no acute changes  Relevant CV Studies: As above.  Laboratory Data:  Chemistry Recent Labs  Lab 06/25/17 1207  06/26/17 0738  NA 141 143  K 3.3* 3.7  CL 100* 103  CO2 31 27  GLUCOSE 105* 116*  BUN 35* 28*  CREATININE 1.61* 1.62*  CALCIUM 8.9 9.1  GFRNONAA 40* 39*  GFRAA 46* 46*  ANIONGAP 10 13    Recent Labs  Lab 06/25/17 1207 06/26/17 0738  PROT 7.1 6.7  ALBUMIN 3.6 3.5  AST 44* 38  ALT 32 30  ALKPHOS 215* 190*  BILITOT 1.3* 1.6*   Hematology Recent Labs  Lab 06/25/17 1207 06/26/17 0738  WBC 7.3 8.0  RBC 4.51 4.37  HGB 12.2* 12.1*  HCT 38.6* 38.0*  MCV 85.6 87.0  MCH 27.1 27.7  MCHC 31.6 31.8  RDW 21.9* 21.1*  PLT 169 163   Cardiac Enzymes Recent Labs  Lab 06/25/17 1207 06/25/17 2045 06/26/17 0252 06/26/17 0738  TROPONINI 0.25* 0.27* 0.29* 0.34*   No results for input(s): TROPIPOC in the last 168 hours.  BNP Recent Labs  Lab 06/25/17 1207  BNP 420.9*    DDimer No results for input(s): DDIMER in the last 168 hours.  Radiology/Studies:  Dg Chest 2 View  Result Date: 06/25/2017 CLINICAL DATA:  Progressively worsening shortness of breath. EXAM: CHEST - 2 VIEW COMPARISON:  03/27/2017 FINDINGS: Cardiomegaly as seen previously. Aortic atherosclerosis. Bilateral pleural effusions layering dependently with dependent pulmonary atelectasis. Upper lungs are clear. No acute bone finding. IMPRESSION: Cardiomegaly, aortic atherosclerosis, pleural effusions and basilar atelectasis. Findings most consistent with chronic congestive heart failure. Electronically Signed   By: Nelson Chimes M.D.   On: 06/25/2017 12:03    Assessment and Plan:   1. Recurrent bilateral pleural effusions, with contribution from acute on chronic combined CHF/NICM, ?cirrhosis as well - symptoms appear to be due to recurrence of effusions. BNP seems out of proportion to  his degree of dyspnea and size of effusions, raising question as to whether this is a multifactorial process. He is diuresing so far but has not seen much improvement in his symptoms. cMRI was considered to exclude infiltrative disease as  cause for his NICM. Would continue for now as Cr seems to be stable but we have consulted pulmonary for further input on his pleural effusions as he may require repeat thoracentesis for diagnostic purposes as well. As he was not on spironolactone as OP due to h/o hyperkalemia with this, will not resume yet this admission. Repeat echo already ordered by primary team, will await result. Dr. Radford Pax also requests CHF team see in AM - have notified Trish to help coordinate.   3. Elevated troponin - demand process related to CHF (no sig CAD in 09/2016 by cath).  3. NSVT -  History of such. 2 very brief runs of NSVT noted on telemetry thus far. Continue beta blocker. Replete K to goal 4.0 (will give 68mq daily) and Mg to goal 2.0 (rx 1g Mag Sulfate). F/u lytes in AM.  4. CKD stage III - watch Cr with diuresis. Appears near baseline.  For questions or updates, please contact [place] [email or web address] for contact info under Cardiology/STEMI.   Signed, DCharlie Pitter PA-C  06/26/2017 1:51 PM

## 2017-06-27 DIAGNOSIS — I5023 Acute on chronic systolic (congestive) heart failure: Secondary | ICD-10-CM

## 2017-06-27 DIAGNOSIS — E1122 Type 2 diabetes mellitus with diabetic chronic kidney disease: Secondary | ICD-10-CM

## 2017-06-27 DIAGNOSIS — J90 Pleural effusion, not elsewhere classified: Secondary | ICD-10-CM

## 2017-06-27 LAB — BASIC METABOLIC PANEL
ANION GAP: 12 (ref 5–15)
BUN: 32 mg/dL — ABNORMAL HIGH (ref 6–20)
CHLORIDE: 103 mmol/L (ref 101–111)
CO2: 30 mmol/L (ref 22–32)
Calcium: 9.3 mg/dL (ref 8.9–10.3)
Creatinine, Ser: 1.68 mg/dL — ABNORMAL HIGH (ref 0.61–1.24)
GFR calc non Af Amer: 38 mL/min — ABNORMAL LOW (ref >60)
GFR, EST AFRICAN AMERICAN: 44 mL/min — AB (ref >60)
Glucose, Bld: 114 mg/dL — ABNORMAL HIGH (ref 65–99)
POTASSIUM: 3.4 mmol/L — AB (ref 3.5–5.1)
Sodium: 145 mmol/L (ref 135–145)

## 2017-06-27 LAB — GLUCOSE, CAPILLARY
GLUCOSE-CAPILLARY: 113 mg/dL — AB (ref 65–99)
Glucose-Capillary: 141 mg/dL — ABNORMAL HIGH (ref 65–99)
Glucose-Capillary: 148 mg/dL — ABNORMAL HIGH (ref 65–99)
Glucose-Capillary: 148 mg/dL — ABNORMAL HIGH (ref 65–99)

## 2017-06-27 LAB — MAGNESIUM: Magnesium: 1.9 mg/dL (ref 1.7–2.4)

## 2017-06-27 MED ORDER — POTASSIUM CHLORIDE CRYS ER 20 MEQ PO TBCR
20.0000 meq | EXTENDED_RELEASE_TABLET | Freq: Once | ORAL | Status: AC
  Administered 2017-06-27: 20 meq via ORAL
  Filled 2017-06-27: qty 1

## 2017-06-27 MED ORDER — ZOLPIDEM TARTRATE 5 MG PO TABS
5.0000 mg | ORAL_TABLET | Freq: Every evening | ORAL | Status: DC | PRN
  Administered 2017-06-27 – 2017-06-28 (×2): 5 mg via ORAL
  Filled 2017-06-27 (×2): qty 1

## 2017-06-27 NOTE — Consult Note (Signed)
   [facility] Inpatient Consult   06/27/2017  Dillon Andrews [birth year] [id number]  Patient screened for potential [place] Management services. Patient is in the [place] [facility] [place] Management services under patient's [facility] plan. Patient is a  77 yo male presented with progressive dyspnea, orthopnea, edema. PMHx of combined CHF, CKD 3, DM, NASH with cirrhosis and esophageal varices, GERD, HTN, HLD per MD notes. Inpatient RNCM had assign patient for HF EMMI follow up calls.  Met the patient and daughter at the bedside to explained benefits of [facility] Care Management and in regards to follow up EMMI calls.   For questions contact:   Natividad Brood, RN BSN [place] Liaison  [phone] business mobile phone Toll free office [phone]

## 2017-06-27 NOTE — Progress Notes (Signed)
Progress Note  Patient Name: Dillon Andrews Date of Encounter: 06/27/2017  Primary Cardiologist:  Glenetta Hew, MD  Subjective   Breathing pretty well today, still SOB when he bends over and does things. Did not drop sats w/ ambulation.  Inpatient Medications    Scheduled Meds: . aspirin EC  81 mg Oral Daily  . enoxaparin (LOVENOX) injection  40 mg Subcutaneous Q24H  . furosemide  60 mg Intravenous Q12H  . gabapentin  800 mg Oral QHS  . insulin aspart  0-15 Units Subcutaneous TID WC  . insulin aspart  0-5 Units Subcutaneous QHS  . mirtazapine  7.5 mg Oral QHS  . pantoprazole  40 mg Oral Daily  . potassium chloride  20 mEq Oral Daily  . potassium chloride  20 mEq Oral Once  . rosuvastatin  10 mg Oral Daily  . sodium chloride flush  3 mL Intravenous Q12H   Continuous Infusions: . sodium chloride     PRN Meds: sodium chloride, acetaminophen **OR** acetaminophen, HYDROcodone-acetaminophen, ondansetron **OR** ondansetron (ZOFRAN) IV, sodium chloride flush   Vital Signs    Vitals:   06/26/17 1654 06/26/17 1925 06/27/17 0447 06/27/17 0824  BP: 108/76 113/68 125/77 102/67  Pulse: 79 (!) 109 77 78  Resp: 16 18 18    Temp: 99.1 F (37.3 C) 98.3 F (36.8 C) 97.8 F (36.6 C) 98.5 F (36.9 C)  TempSrc: Oral Oral Oral Oral  SpO2: 93% 94% 97%   Weight:   183 lb 11.2 oz (83.3 kg)   Height:        Intake/Output Summary (Last 24 hours) at 06/27/2017 1052 Last data filed at 06/27/2017 0842 Gross per 24 hour  Intake 1040 ml  Output 2750 ml  Net -1710 ml   Filed Weights   06/25/17 1659 06/26/17 0657 06/27/17 0447  Weight: 194 lb (88 kg) 186 lb 6.4 oz (84.6 kg) 183 lb 11.2 oz (83.3 kg)    Telemetry    SR, no sig ectopy - Personally Reviewed  ECG    None today - Personally Reviewed  Physical Exam   General: Well developed, well nourished, male appearing in no acute distress. Head: Normocephalic, atraumatic.  Neck: Supple without bruits, JVD 9 cm. Lungs:  Resp  regular and unlabored, decreased BS bases. Heart: RRR, S1, S2, no S3, S4, or murmur; no rub. Abdomen: Soft, non-tender, non-distended with normoactive bowel sounds. No hepatomegaly. No rebound/guarding. No obvious abdominal masses. Extremities: No clubbing, cyanosis, no edema. Distal pedal pulses are 2+ bilaterally. Neuro: Alert and oriented X 3. Moves all extremities spontaneously. Psych: Normal affect.  Labs    Hematology Recent Labs  Lab 06/25/17 1207 06/26/17 0738  WBC 7.3 8.0  RBC 4.51 4.37  HGB 12.2* 12.1*  HCT 38.6* 38.0*  MCV 85.6 87.0  MCH 27.1 27.7  MCHC 31.6 31.8  RDW 21.9* 21.1*  PLT 169 163    Chemistry Recent Labs  Lab 06/25/17 1207 06/26/17 0738 06/27/17 0743  NA 141 143 145  K 3.3* 3.7 3.4*  CL 100* 103 103  CO2 31 27 30   GLUCOSE 105* 116* 114*  BUN 35* 28* 32*  CREATININE 1.61* 1.62* 1.68*  CALCIUM 8.9 9.1 9.3  PROT 7.1 6.7  --   ALBUMIN 3.6 3.5  --   AST 44* 38  --   ALT 32 30  --   ALKPHOS 215* 190*  --   BILITOT 1.3* 1.6*  --   GFRNONAA 40* 39* 38*  GFRAA 46* 46* 44*  ANIONGAP 10 13 12      Cardiac Enzymes Recent Labs  Lab 06/25/17 1207 06/25/17 2045 06/26/17 0252 06/26/17 0738  TROPONINI 0.25* 0.27* 0.29* 0.34*      BNP Recent Labs  Lab 06/25/17 1207  BNP 420.9*     Radiology    Dg Chest 2 View  Result Date: 06/25/2017 CLINICAL DATA:  Progressively worsening shortness of breath. EXAM: CHEST - 2 VIEW COMPARISON:  03/27/2017 FINDINGS: Cardiomegaly as seen previously. Aortic atherosclerosis. Bilateral pleural effusions layering dependently with dependent pulmonary atelectasis. Upper lungs are clear. No acute bone finding. IMPRESSION: Cardiomegaly, aortic atherosclerosis, pleural effusions and basilar atelectasis. Findings most consistent with chronic congestive heart failure. Electronically Signed   By: Nelson Chimes M.D.   On: 06/25/2017 12:03     Cardiac Studies   ECHO:  06/26/2017 - Left ventricle: The cavity size was  normal. Wall thickness was   increased in a pattern of severe LVH. Systolic function was   severely reduced. The estimated ejection fraction was in the   range of 25% to 30%. Mild hypokinesis of the basalanteroseptal   myocardium. - Aortic valve: Mildly calcified annulus. Trileaflet; normal   thickness, mildly calcified leaflets. - Left atrium: The atrium was mildly dilated. - Right ventricle: The cavity size was mildly to moderately   dilated. Wall thickness was normal. - Right atrium: The atrium was moderately to severely dilated.   Patient Profile     77 y.o. male w/ hx  chronic combined CHF, pleural effusions s/p T-centesis, NICM, tricuspid regurgitation, mitral regurgitation, CKD III, presumed NASH (per pt, never bx proven), esophageal varices per chart (prior glass of wine occasionally but no heavy alcohol), HTN, HLD, DM, Kieara Schwark's esophagus, GERD, anemia, RBBB  Assessment & Plan     Principal Problem: 1.  CHF exacerbation ([place]) - He is on IV Lasix - was seen by Dr Debbora Dus and thoracentesis recommended, timing unclear - cause of effusions unclear, he recommends testing for RA, ANA, MTB - He also recommends a trial of steroids, not started yet - PFTs to be done, unclear if before or after the thoracentesis - echo results above - hopefully can change to po Lasix in a day or so - troponin levels more c/w CHF than ACS, hx non-obs dz by cath, no ischemic eval indicated  Otherwise, per IM Active Problems:   CKD (chronic kidney disease) stage 3, GFR 30-59 ml/min (HCC)   Elevated troponin   Restless leg syndrome   Type II diabetes mellitus ([place])   Essential hypertension   Non-ischemic cardiomyopathy ([place])   Hypokalemia    Signed, Rosaria Ferries , PA-C 10:52 AM 06/27/2017 Pager: [phone]

## 2017-06-27 NOTE — Consult Note (Addendum)
Advanced Heart Failure Team Consult Note   Primary Physician: [name], MD PCP-Cardiologist:  Glenetta Hew, MD  Reason for Consultation: Acute on chronic systolic CHF  HPI:    Dillon Andrews is seen today for evaluation of Acute on chronic systolic CHF at the request of Dr. Caffie Damme.   Dillon Andrews is a 77 y.o. male with a hx of chronic combined CHF, NICM, tricuspid regurgitation, mitral regurgitation, CKD III, presumed NASH (per pt, never bx proven), esophageal varices per chart (prior glass of wine occasionally but no heavy alcohol), HTN, HLD, DM, Barrett's esophagus, GERD, anemia, and RBBB  Last seen in [facility] office 05/12/17 with Dr Ellyn Hack. Was feeling much better after hospitalization in 03/2017 for CHF. Weight was down. He did complain of bendopnea, but not orthopnea. No CP. Trivial edema noted. Spironolactone added.   Labs from visit above with AKI and elevated K, so held KCl and spiro, and reduced lasix for several days. He had since returned to his normal dose of 80 mg daily.  He called the [facility] office 06/25/17 with significantly worsening dyspnea over several days.  Pertinent labs on admission include K 3.3, Cr 1.61, [place] troponins in 0.24-0.29, BNP 420, and Hgb 12.2. TSH WNL. CXR with cardiomegaly, aortic atherosclerosis, pleural effusions, and basilar atelectasis.  CCM initially saw and recommended thoracentesis, but note this morning says to defer.   Dillon Andrews is very pleasant.  He states he has had more SOB since just prior to his hospitalization. Prior to last fall, he could golf 18 holes without difficulty, now he is SOB after 3 practice swings. He has SOB with showering, and bendopnea. His biggest concern is that he is seen and meds are adjusted, he feels better for a very short time, and then gets SOB quickly again after. He has mild lightheadedness with rapid standing. Denies CP or palpitations.  He feels like he has felt gradually worse since around late  summer last year.   Echo 06/26/17 LVEF 25-30%, Mild LAE, Mild/Mod RV dilation, Mod/Sev RA.  Review of Systems: [y] = yes, [ ]  = no   General: Weight gain [y]; Weight loss [ ] ; Anorexia [ ] ; Fatigue [ ] ; Fever [ ] ; Chills [ ] ; Weakness [ ]   Cardiac: Chest pain/pressure [ ] ; Resting SOB [ ] ; Exertional SOB [y]; Orthopnea [ ] ; Pedal Edema [y]; Palpitations [ ] ; Syncope [ ] ; Presyncope [ ] ; Paroxysmal nocturnal dyspnea[ ]   Pulmonary: Cough [ ] ; Wheezing[ ] ; Hemoptysis[ ] ; Sputum [ ] ; Snoring [ ]   GI: Vomiting[ ] ; Dysphagia[ ] ; Melena[ ] ; Hematochezia [ ] ; Heartburn[ ] ; Abdominal pain [ ] ; Constipation [ ] ; Diarrhea [ ] ; BRBPR [ ]   GU: Hematuria[ ] ; Dysuria [ ] ; Nocturia[ ]   Vascular: Pain in legs with walking [ ] ; Pain in feet with lying flat [ ] ; Non-healing sores [ ] ; Stroke [ ] ; TIA [ ] ; Slurred speech [ ] ;  Neuro: Headaches[ ] ; Vertigo[ ] ; Seizures[ ] ; Paresthesias[ ] ;Blurred vision [ ] ; Diplopia [ ] ; Vision changes [ ]   Ortho/Skin: Arthritis [y]; Joint pain [y]; Muscle pain [ ] ; Joint swelling [ ] ; Back Pain [ ] ; Rash [ ]   Psych: Depression[ ] ; Anxiety[ ]   Heme: Bleeding problems [ ] ; Clotting disorders [ ] ; Anemia [ ]   Endocrine: Diabetes [ ] ; Thyroid dysfunction[ ]   Home Medications Prior to Admission medications   Medication Sig Start Date End Date Taking? Authorizing Provider  aspirin EC 81 MG tablet Take 81 mg by mouth  daily. 12/23/14  Yes [provider]  carvedilol (COREG) 6.25 MG tablet Take 1 tablet (6.25 mg total) by mouth 2 (two) times daily with a meal. 04/29/17  Yes Leonie Man, MD  furosemide (LASIX) 80 MG tablet Take 80 mg tablet  In the morning, may take an extra 40 mg to 80 mg daily depend on weight gain 05/12/17  Yes Leonie Man, MD  gabapentin (NEURONTIN) 800 MG tablet Take 1 tablet (800 mg total) by mouth at bedtime. 03/05/17  Yes [name], MD  glipiZIDE (GLUCOTROL) 5 MG tablet Take 2.5 mg by mouth 2 (two) times daily. 09/21/16  Yes [provider]  mirtazapine (REMERON) 7.5 MG tablet Take 1 tablet (7.5 mg total) by mouth at bedtime. 06/06/17  Yes [name], MD  potassium chloride SA (K-DUR,KLOR-CON) 20 MEQ tablet Take 20 mEq by mouth daily.   Yes [provider]  rosuvastatin (CRESTOR) 10 MG tablet Take 10 mg by mouth daily.   Yes [provider]  spironolactone (ALDACTONE) 25 MG tablet Take 1 tablet (25 mg total) by mouth daily. 05/12/17 08/10/17 Yes Leonie Man, MD  vitamin B-12 (CYANOCOBALAMIN) 500 MCG tablet Take 500 mcg by mouth every other day.   Yes [provider]  glucose blood test strip Use as instructed 03/05/17   [name], MD  omeprazole (PRILOSEC) 40 MG capsule Take 40 mg by mouth daily.    [provider]    Past Medical History: Past Medical History:  Diagnosis Date  . Anemia 11/2016  . Arthritis    "some; hands/fingers" (06/25/2017)  . Atrial tachycardia ([place])   . Barrett's esophagus   . Basal cell carcinoma    "face, arms, hands" (06/25/2017)  . Chicken pox   . Cholelithiasis 11/2016   S/P LAP CHOLE  . Chronic combined systolic and diastolic CHF (congestive heart failure) ([place])   . CKD (chronic kidney disease), stage III ([place])   . Esophageal varices (HCC)    Secondary to NAFLD  . Family history of adverse reaction to anesthesia    "daughter's sat drop very low; last OR had to be reintubated" (06/25/2017)  . GERD (gastroesophageal reflux disease)   . High cholesterol   . Hypertension   . Mitral regurgitation   . NAFLD (nonalcoholic fatty liver disease)   . NICM (nonischemic cardiomyopathy) ([place])   . NSVT (nonsustained ventricular tachycardia) ([place])   . Pneumonia 09/2016  . RBBB   . Tricuspid regurgitation   . Type II diabetes mellitus ([place])     Past Surgical History: Past Surgical History:  Procedure Laterality Date  . BACK SURGERY    . BASAL CELL CARCINOMA EXCISION Right    temple; "had to do skin graft"  . CARDIAC  CATHETERIZATION  09/2016   Minimal, non-occlusive CAD, EF 45% [facility])  . CARPAL TUNNEL RELEASE Bilateral   . CATARACT EXTRACTION W/ INTRAOCULAR LENS  IMPLANT, BILATERAL Bilateral   . CHOLECYSTECTOMY N/A 11/22/2016   Procedure: LAPAROSCOPIC CHOLECYSTECTOMY;  Surgeon: Coralie Keens, MD;  Location: [facility];  Service: General;  Laterality: N/A;  . JOINT REPLACEMENT    . LUMBAR DISC SURGERY  X 2  . TONSILLECTOMY AND ADENOIDECTOMY    . TOTAL KNEE ARTHROPLASTY Left 2010  . TRANSESOPHAGEAL ECHOCARDIOGRAM  09/2016   Mild global LV hypokinesis. Moderate Dillon. Moderate TR. [facility])  . TRANSTHORACIC ECHOCARDIOGRAM  10/2016   [facility]) EF 35-40% with moderate HK. Normal RV function. Moderately  severe TR.  Marland Kitchen TRANSTHORACIC ECHOCARDIOGRAM  03/2017    [facility]) mild concentric LVH. Moderately reduced EF of 40-45%. Diffuse HK with no regional wall motion abnormality. GR 2 DD. Septal dyssynergy. Mild Dillon, moderate TR. Severe RA dilation..   Family History: Family History  Problem Relation Age of Onset  . Cancer Father   . Cataracts Father   . Breast cancer Sister   . Cirrhosis Neg Hx    Social History: Social History   Socioeconomic History  . Marital status: Married    Spouse name: None  . Number of children: None  . Years of education: None  . Highest education level: None  Social Needs  . Financial resource strain: None  . Food insecurity - worry: None  . Food insecurity - inability: None  . Transportation needs - medical: None  . Transportation needs - non-medical: None  Occupational History  . None  Tobacco Use  . Smoking status: Former Smoker    Packs/day: 0.30    Years: 15.00    Pack years: 4.50    Types: Cigarettes    Last attempt to quit: 03/17/1977    Years since quitting: 40.3  . Smokeless tobacco: Never Used  Substance and Sexual Activity  . Alcohol use: Yes    Comment: 06/25/2017 "nothing since 09/2016;  used to have glass of wine q  hs"  . Drug use: No  . Sexual activity: None  Other Topics Concern  . None  Social History Narrative   Accompanied by his daughter --> also a patient of Dr. Ellyn Hack   Allergies:  Allergies  Allergen Reactions  . Spironolactone     After initiation in 04/2017, had hyperkalemia on follow-up labwork so FYI   Objective:    Vital Signs:   Temp:  [97.8 F (36.6 C)-99.1 F (37.3 C)] 97.9 F (36.6 C) (03/15 1156) Pulse Rate:  [77-109] 80 (03/15 1156) Resp:  [16-18] 18 (03/15 0447) BP: (102-125)/(66-77) 105/66 (03/15 1156) SpO2:  [93 %-97 %] 96 % (03/15 1156) Weight:  [183 lb 11.2 oz (83.3 kg)] 183 lb 11.2 oz (83.3 kg) (03/15 0447) Last BM Date: 06/27/17  Weight change: Filed Weights   06/25/17 1659 06/26/17 0657 06/27/17 0447  Weight: 194 lb (88 kg) 186 lb 6.4 oz (84.6 kg) 183 lb 11.2 oz (83.3 kg)   Intake/Output:   Intake/Output Summary (Last 24 hours) at 06/27/2017 1226 Last data filed at 06/27/2017 1000 Gross per 24 hour  Intake 1040 ml  Output 2700 ml  Net -1660 ml    Physical Exam    General:  Elderly, but well appearing. No resp difficulty HEENT: normal Neck: Supple. JVP ~12 with mild HJR. Carotids 2+ bilat; no bruits. No lymphadenopathy or thyromegaly appreciated. Cor: PMI nondisplaced. Regular rate & rhythm. 1/6 TR Murmur.  Lungs: Dull bilaterally to 1/3 of the way up.  Abdomen: Soft, nontender, mildly distended. No hepatosplenomegaly. No bruits or masses. Good bowel sounds. Extremities: No cyanosis, clubbing, or rash. 1-2+ edema.  Neuro: Alert & orientedx3, cranial nerves grossly intact. moves all 4 extremities w/o difficulty. Affect very pleasant  Telemetry   NSR, personally reviewed.   EKG    NSR with RBBB, personally reviewed.   Labs   Basic Metabolic Panel: Recent Labs  Lab 06/25/17 1207 06/26/17 0738 06/27/17 0743  NA 141 143 145  K 3.3* 3.7 3.4*  CL 100* 103 103  CO2 31 27 30   GLUCOSE 105* 116* 114*  BUN 35* 28* 32*  CREATININE 1.61*  1.62* 1.68*  CALCIUM 8.9 9.1 9.3  MG  --  1.7 1.9  PHOS  --  3.3  --     Liver Function Tests: Recent Labs  Lab 06/25/17 1207 06/26/17 0738  AST 44* 38  ALT 32 30  ALKPHOS 215* 190*  BILITOT 1.3* 1.6*  PROT 7.1 6.7  ALBUMIN 3.6 3.5   No results for input(s): LIPASE, AMYLASE in the last 168 hours. No results for input(s): AMMONIA in the last 168 hours.  CBC: Recent Labs  Lab 06/25/17 1207 06/26/17 0738  WBC 7.3 8.0  NEUTROABS 4.8  --   HGB 12.2* 12.1*  HCT 38.6* 38.0*  MCV 85.6 87.0  PLT 169 163    Cardiac Enzymes: Recent Labs  Lab 06/25/17 1207 06/25/17 2045 06/26/17 0252 06/26/17 0738  TROPONINI 0.25* 0.27* 0.29* 0.34*    BNP: BNP (last 3 results) Recent Labs    11/19/16 1326 03/27/17 1215 06/25/17 1207  BNP 439.5* 489.5* 420.9*    ProBNP (last 3 results) No results for input(s): PROBNP in the last 8760 hours.   CBG: Recent Labs  Lab 06/26/17 1128 06/26/17 1652 06/26/17 2116 06/27/17 0750 06/27/17 1136  GLUCAP 113* 175* 137* 113* 141*    Coagulation Studies: No results for input(s): LABPROT, INR in the last 72 hours.   Imaging    No results found.   Medications:     Current Medications: . aspirin EC  81 mg Oral Daily  . enoxaparin (LOVENOX) injection  40 mg Subcutaneous Q24H  . furosemide  60 mg Intravenous Q12H  . gabapentin  800 mg Oral QHS  . insulin aspart  0-15 Units Subcutaneous TID WC  . insulin aspart  0-5 Units Subcutaneous QHS  . mirtazapine  7.5 mg Oral QHS  . pantoprazole  40 mg Oral Daily  . potassium chloride  20 mEq Oral Daily  . rosuvastatin  10 mg Oral Daily  . sodium chloride flush  3 mL Intravenous Q12H     Infusions: . sodium chloride       Patient Profile   Dillon Andrews is a 77 y.o. male with a hx of chronic combined CHF, NICM, tricuspid regurgitation, mitral regurgitation, CKD III, presumed NASH (per pt, never bx proven), esophageal varices per chart (prior glass of wine occasionally but  no heavy alcohol), HTN, HLD, DM, Barrett's esophagus, GERD, anemia, RBBB  HF team asked to see with concerns for amyloidosis.   Assessment/Plan   1. Acute on chronic systolic CHF, NICM by cath at [facility] 09/2016 - Volume status remains elevated. - Continue lasix 60 mg IV BID with excellent response so far.  - Echo 06/26/17 LVEF 25-30%, Mild LAE, Mild/Mod RV dilation, Mod/Sev RA. - Spironolactone listed as allergy due to AKI/HyperK in 04/2017. Will discuss with MD prior to re-challenging.  - Coreg held with decompensation.  - Consider low dose ARB but pressures soft in low 100s this pm. Poor candidate for Entresto with marked hyper K and AKI on spiro.  - Some concern for amyloid. Creatinine too high for cMRI.  - SPEP, UPEP, and PYP scan.   2. Recurrent bilateral pleural effusions - CXR with at least moderate effusions - CCM on board. Initially recommending Thoracentesis, but now deferred with excellent diuresis. - CKD prohibitive for cMRI at this time, so will plan TcPYP scan to look for infiltrative cardiac disease.  - Low threshold for thoracentesis.   3. NSVT- - Goal K > 4.0 and Mg > 2.0.  - Supp ordered.  4. CKD Stage III - Creatinine 1.6 today. Out of range for cMRI, so will order TcPYP scan with concerns for cardiac amyloid.   5. Dillon - TEE June 2018 [facility]: Mild global LV hypokinesis. Moderate Dillon. Moderate TR. - Dillon not visualized (or commented on) on Echo this admit.   6. Hypokalemia - Supped today. Will give additional 20 meq this evening with excellent diuresis.   PYP scan cannot be done until Monday as Tc has be sent for from [place]. Continue diuresis and titrate HF meds as tolerated. Low threshold to have IR look at thoracentesis to see if amenable to Thoracentesis.  If home over weekend, can do PYP scan as outpatient.   Medication concerns reviewed with patient and pharmacy team. Barriers identified: None at this time.   Length of Stay: 2  Annamaria Helling    06/27/2017, 12:26 PM  Advanced Heart Failure Team Pager [phone] (M-F; 7a - 4p)  Please contact [facility] for night-coverage after hours (4p -7a ) and weekends on [email or web address]  77 y/o male with h/o NICM, CKD III and recurrent pleural effusions admitted with NYHA IIIB HF symptoms and volume overload. Echo reviewed personally shows EF 25-30% with severe LVH. CXR with moderate bilateral effusions. Has been responding to IV lasix - now down 11 pounds but still volume overloaded with significant JVD and ab bloating on exam.   I am very concerned about the possibility of cardiac amyloidosis. Creatinine too high for cMRI. Will check SPEP/UPEP and plan PYP scan on Monday. Over weekend would continue IV diuresis. Repeat CXR. Suspect he will need thoracentesis prior to d/c. If still symptomatic after diuresis consider RHC early next week.   Glori Bickers, MD  4:36 PM

## 2017-06-27 NOTE — Progress Notes (Signed)
PCCM Progress Note  Admission date: 06/25/2017  CC: Short of breath  HPI: 77 yo male presented with progressive dyspnea, orthopnea, edema. PMHx of combined CHF, CKD 3, DM, NASH with cirrhosis and esophageal varices, GERD, HTN, HLD.  Subjective: Denies chest pain.  Breathing better.  Able to ambulate in hall w/o supplemental oxygen.  His concern is that he gets better in hospital, but then has recurrence quickly after discharge.  Vital signs: BP 102/67 (BP Location: Right Arm)   Pulse 78   Temp 98.5 F (36.9 C) (Oral)   Resp 18   Ht 5' 11"  (1.803 m)   Wt 183 lb 11.2 oz (83.3 kg) Comment: scale b  SpO2 97%   BMI 25.62 kg/m   Intake/output: I/O last 3 completed shifts: In: 1180 [P.O.:1080; IV Piggyback:100] Out: 3545 [Urine:4421; Stool:1]  Physical exam:  General - pleasant Eyes - pupils reactive ENT - no sinus tenderness, no oral exudate, no LAN Cardiac - regular, no murmur Chest - decreased BS at bases b/l Abd - soft, non tender Ext - no edema Skin - no rashes Neuro - normal strength Psych - normal mood  Labs: CBC Recent Labs    06/25/17 1207 06/26/17 0738  WBC 7.3 8.0  HGB 12.2* 12.1*  HCT 38.6* 38.0*  PLT 169 163    Coag's No results for input(s): APTT, INR in the last 72 hours.  BMET Recent Labs    06/25/17 1207 06/26/17 0738 06/27/17 0743  NA 141 143 145  K 3.3* 3.7 3.4*  CL 100* 103 103  CO2 31 27 30   BUN 35* 28* 32*  CREATININE 1.61* 1.62* 1.68*  GLUCOSE 105* 116* 114*    Electrolytes Recent Labs    06/25/17 1207 06/26/17 0738 06/27/17 0743  CALCIUM 8.9 9.1 9.3  MG  --  1.7 1.9  PHOS  --  3.3  --     Sepsis Markers No results for input(s): PROCALCITON, O2SATVEN in the last 72 hours.  Invalid input(s): LACTICACIDVEN  ABG No results for input(s): PHART, PCO2ART, PO2ART in the last 72 hours.  Liver Enzymes Recent Labs    06/25/17 1207 06/26/17 0738  AST 44* 38  ALT 32 30  ALKPHOS 215* 190*  BILITOT 1.3* 1.6*   ALBUMIN 3.6 3.5    Cardiac Enzymes Recent Labs    06/25/17 2045 06/26/17 0252 06/26/17 0738  TROPONINI 0.27* 0.29* 0.34*    Glucose Recent Labs    06/25/17 2200 06/26/17 0731 06/26/17 1128 06/26/17 1652 06/26/17 2116 06/27/17 0750  GLUCAP 147* 114* 113* 175* 137* 113*    Imaging Dg Chest 2 View  Result Date: 06/25/2017 CLINICAL DATA:  Progressively worsening shortness of breath. EXAM: CHEST - 2 VIEW COMPARISON:  03/27/2017 FINDINGS: Cardiomegaly as seen previously. Aortic atherosclerosis. Bilateral pleural effusions layering dependently with dependent pulmonary atelectasis. Upper lungs are clear. No acute bone finding. IMPRESSION: Cardiomegaly, aortic atherosclerosis, pleural effusions and basilar atelectasis. Findings most consistent with chronic congestive heart failure. Electronically Signed   By: Nelson Chimes M.D.   On: 06/25/2017 12:03    Studies: V/Q scan 11/26/16 >> no PE CT chest 03/20/17 >> cardiomegaly, atherosclerosis, mod b/l effusions with pulmonary edema, compressive ATX, ascites Echo 3/14 >> EF 25 to 30%, severe LVH, mild/mod RV dilation  Discussion: He has pleural effusions in setting of CHF and cirrhosis.  He has good diuresis.  Not needing supplemental oxygen at this time and has symptomatic improvement with in hospital therapies.  Assessment/plan:  Bilateral pleural effusions in  setting of combined CHF, and cirrhosis. - no indication for thoracentesis at this time - f/u CXR intermittently  Acute on chronic combined CHF. - per primary team and cardiology  PCCM will f/u on Monday 06/30/17 if he is still in hospital.  Otherwise he can f/u with Dr. Chase Caller as outpt.  Updated pt's son at bedside  Chesley Mires, MD [place] 06/27/2017, 11:38 AM Pager:  [phone] After 3pm call: [phone]

## 2017-06-27 NOTE — Progress Notes (Addendum)
PROGRESS NOTE    Dillon Andrews  QMG:[id number] DOB: Apr 13, 1941 DOA: 06/25/2017 PCP: [name], MD     Brief Narrative:  Dillon Andrews is a 77 yo male with past medical history significant for combined systolic, diastolic heart failure, chronic kidney disease stage III, type 2 diabetes, esophageal varices secondary to cirrhosis secondary to [facility], GERD, hypertension, hyperlipidemia.  He presents with 2-3-day history of worsening shortness of breath, especially with exertion.  His daughter called cardiologist who recommended patient to be evaluated.  He went to [place].  Chest x-ray revealed cardiomegaly, pleural effusions and basilar atelectasis.  He was transferred to [facility] for further evaluation and treatment of acute CHF.  Assessment & Plan:   Principal Problem:   CHF exacerbation (Dillon Andrews) Active Problems:   CKD (chronic kidney disease) stage 3, GFR 30-59 ml/min (HCC)   Elevated troponin   Restless leg syndrome   Type II diabetes mellitus (HCC)   Essential hypertension   Non-ischemic cardiomyopathy (HCC)   Hypokalemia  Acute on chronic systolic and diastolic heart failure -BNP 420.9 -Cardiology consulted -Continue Lasix 60 mg every 12 hours -Strict I's and O's, daily weight -Echocardiogram EF 25-30%, mild hypokinesis of basalanteroseptal myocardium, severe LVH  -Continue Coreg -Cardiology has consulted advanced heart failure team   Bilateral pleural effusions -In setting of CHF and cirrhosis -PCCM consulted, no thoracentesis planned for now. Follow CXR intermittently.   Chronically elevated troponin -Previously ranges from 0.2-0.3  -Flat in trend and not consistent with ACS   Cirrhosis secondary to NASH, with esophageal varices -Aldactone has been on hold since 3/4 per Dr. Ellyn Hack due to Cr 1.82 and K 5.4. Lasix as above   Chronic kidney disease stage III -Baseline creatinine 1.5 -Stable   Type 2 diabetes with peripheral  neuropathy -SSI  -Continue neurontin   GERD -Continue PPI  HLD -Continue crestor   Hypokalemia -Replace, trend    DVT prophylaxis: Lovenox Code Status: Full code Family Communication: No family at bedside Disposition Plan: Pending clinical improvement, heart failure team consultation    Consultants:   Cardiology  PCCM  Advance heart failure   Procedures:   None   Antimicrobials:  Anti-infectives (From admission, onward)   None       Subjective: Continues to have exertional shortness of breath. Ambulated down the hall yesterday. Doing well at rest. No edema.   Objective: Vitals:   06/26/17 1925 06/27/17 0447 06/27/17 0824 06/27/17 1156  BP: 113/68 125/77 102/67 105/66  Pulse: (!) 109 77 78 80  Resp: 18 18    Temp: 98.3 F (36.8 C) 97.8 F (36.6 C) 98.5 F (36.9 C) 97.9 F (36.6 C)  TempSrc: Oral Oral Oral Oral  SpO2: 94% 97%  96%  Weight:  83.3 kg (183 lb 11.2 oz)    Height:        Intake/Output Summary (Last 24 hours) at 06/27/2017 1203 Last data filed at 06/27/2017 1000 Gross per 24 hour  Intake 1040 ml  Output 2700 ml  Net -1660 ml   Filed Weights   06/25/17 1659 06/26/17 0657 06/27/17 0447  Weight: 88 kg (194 lb) 84.6 kg (186 lb 6.4 oz) 83.3 kg (183 lb 11.2 oz)   Examination: General exam: Appears calm and comfortable  Respiratory system: Clear to auscultation. Respiratory effort normal. Cardiovascular system: S1 & S2 heard, RRR. No JVD, murmurs, rubs, gallops or clicks. No pedal edema. Gastrointestinal system: Abdomen is nondistended, soft and nontender. No organomegaly or masses  felt. Normal bowel sounds heard. Central nervous system: Alert and oriented. No focal neurological deficits. Extremities: Symmetric 5 x 5 power. Skin: No rashes, lesions or ulcers Psychiatry: Judgement and insight appear normal. Mood & affect appropriate.    Data Reviewed: I have personally reviewed following labs and imaging studies  CBC: Recent Labs    Lab 07/08/2017 1207 06/26/17 0738  WBC 7.3 8.0  NEUTROABS 4.8  --   HGB 12.2* 12.1*  HCT 38.6* 38.0*  MCV 85.6 87.0  PLT 169 552   Basic Metabolic Panel: Recent Labs  Lab 07-08-17 1207 06/26/17 0738 06/27/17 0743  NA 141 143 145  K 3.3* 3.7 3.4*  CL 100* 103 103  CO2 31 27 30   GLUCOSE 105* 116* 114*  BUN 35* 28* 32*  CREATININE 1.61* 1.62* 1.68*  CALCIUM 8.9 9.1 9.3  MG  --  1.7 1.9  PHOS  --  3.3  --    GFR: Estimated Creatinine Clearance: 39.2 mL/min (A) (by C-G formula based on SCr of 1.68 mg/dL (H)). Liver Function Tests: Recent Labs  Lab 08-Jul-2017 1207 06/26/17 0738  AST 44* 38  ALT 32 30  ALKPHOS 215* 190*  BILITOT 1.3* 1.6*  PROT 7.1 6.7  ALBUMIN 3.6 3.5   No results for input(s): LIPASE, AMYLASE in the last 168 hours. No results for input(s): AMMONIA in the last 168 hours. Coagulation Profile: No results for input(s): INR, PROTIME in the last 168 hours. Cardiac Enzymes: Recent Labs  Lab 08-Jul-2017 1207 2017-07-08 2045 06/26/17 0252 06/26/17 0738  TROPONINI 0.25* 0.27* 0.29* 0.34*   BNP (last 3 results) No results for input(s): PROBNP in the last 8760 hours. HbA1C: No results for input(s): HGBA1C in the last 72 hours. CBG: Recent Labs  Lab 06/26/17 1128 06/26/17 1652 06/26/17 2116 06/27/17 0750 06/27/17 1136  GLUCAP 113* 175* 137* 113* 141*   Lipid Profile: No results for input(s): CHOL, HDL, LDLCALC, TRIG, CHOLHDL, LDLDIRECT in the last 72 hours. Thyroid Function Tests: Recent Labs    06/26/17 0252  TSH 2.456   Anemia Panel: No results for input(s): VITAMINB12, FOLATE, FERRITIN, TIBC, IRON, RETICCTPCT in the last 72 hours. Sepsis Labs: No results for input(s): PROCALCITON, LATICACIDVEN in the last 168 hours.  No results found for this or any previous visit (from the past 240 hour(s)).     Radiology Studies: No results found.    Scheduled Meds: . aspirin EC  81 mg Oral Daily  . enoxaparin (LOVENOX) injection  40 mg  Subcutaneous Q24H  . furosemide  60 mg Intravenous Q12H  . gabapentin  800 mg Oral QHS  . insulin aspart  0-15 Units Subcutaneous TID WC  . insulin aspart  0-5 Units Subcutaneous QHS  . mirtazapine  7.5 mg Oral QHS  . pantoprazole  40 mg Oral Daily  . potassium chloride  20 mEq Oral Daily  . rosuvastatin  10 mg Oral Daily  . sodium chloride flush  3 mL Intravenous Q12H   Continuous Infusions: . sodium chloride       LOS: 2 days    Time spent: 25 minutes   Dessa Phi, DO [facility] [email or web address] Password [facility] 06/27/2017, 12:03 PM

## 2017-06-27 NOTE — Progress Notes (Signed)
Pt would like something something for sleep page night time MD for orders.

## 2017-06-28 DIAGNOSIS — I5043 Acute on chronic combined systolic (congestive) and diastolic (congestive) heart failure: Secondary | ICD-10-CM

## 2017-06-28 LAB — BASIC METABOLIC PANEL
ANION GAP: 13 (ref 5–15)
BUN: 36 mg/dL — ABNORMAL HIGH (ref 6–20)
CHLORIDE: 102 mmol/L (ref 101–111)
CO2: 30 mmol/L (ref 22–32)
Calcium: 9.2 mg/dL (ref 8.9–10.3)
Creatinine, Ser: 1.69 mg/dL — ABNORMAL HIGH (ref 0.61–1.24)
GFR calc non Af Amer: 37 mL/min — ABNORMAL LOW (ref >60)
GFR, EST AFRICAN AMERICAN: 43 mL/min — AB (ref >60)
GLUCOSE: 119 mg/dL — AB (ref 65–99)
Potassium: 3.4 mmol/L — ABNORMAL LOW (ref 3.5–5.1)
Sodium: 145 mmol/L (ref 135–145)

## 2017-06-28 LAB — GLUCOSE, CAPILLARY
GLUCOSE-CAPILLARY: 100 mg/dL — AB (ref 65–99)
GLUCOSE-CAPILLARY: 148 mg/dL — AB (ref 65–99)
Glucose-Capillary: 101 mg/dL — ABNORMAL HIGH (ref 65–99)
Glucose-Capillary: 146 mg/dL — ABNORMAL HIGH (ref 65–99)

## 2017-06-28 LAB — MAGNESIUM: Magnesium: 1.8 mg/dL (ref 1.7–2.4)

## 2017-06-28 MED ORDER — POTASSIUM CHLORIDE CRYS ER 20 MEQ PO TBCR
40.0000 meq | EXTENDED_RELEASE_TABLET | Freq: Once | ORAL | Status: AC
  Administered 2017-06-28: 40 meq via ORAL
  Filled 2017-06-28: qty 2

## 2017-06-28 MED ORDER — POTASSIUM CHLORIDE CRYS ER 20 MEQ PO TBCR
20.0000 meq | EXTENDED_RELEASE_TABLET | Freq: Once | ORAL | Status: AC
  Administered 2017-06-28: 20 meq via ORAL
  Filled 2017-06-28: qty 1

## 2017-06-28 MED ORDER — METOLAZONE 2.5 MG PO TABS
2.5000 mg | ORAL_TABLET | Freq: Once | ORAL | Status: AC
  Administered 2017-06-28: 2.5 mg via ORAL
  Filled 2017-06-28: qty 1

## 2017-06-28 MED ORDER — FUROSEMIDE 10 MG/ML IJ SOLN
80.0000 mg | Freq: Two times a day (BID) | INTRAMUSCULAR | Status: DC
  Administered 2017-06-28 – 2017-06-29 (×2): 80 mg via INTRAVENOUS
  Filled 2017-06-28 (×2): qty 8

## 2017-06-28 MED ORDER — MAGNESIUM SULFATE IN D5W 1-5 GM/100ML-% IV SOLN
1.0000 g | Freq: Once | INTRAVENOUS | Status: AC
  Administered 2017-06-28: 1 g via INTRAVENOUS
  Filled 2017-06-28: qty 100

## 2017-06-28 NOTE — Progress Notes (Signed)
PROGRESS NOTE    Dillon Andrews  SLH:[id number] DOB: 04-27-1940 DOA: 06/25/2017 PCP: [name], MD     Brief Narrative:  Dillon Andrews is a 77 yo male with past medical history significant for combined systolic, diastolic heart failure, chronic kidney disease stage III, type 2 diabetes, esophageal varices secondary to cirrhosis secondary to [facility], GERD, hypertension, hyperlipidemia.  He presents with 2-3-day history of worsening shortness of breath, especially with exertion.  His daughter called cardiologist who recommended patient to be evaluated.  He went to [place].  Chest x-ray revealed cardiomegaly, pleural effusions and basilar atelectasis.  He was transferred to [facility] for further evaluation and treatment of acute CHF.  Assessment & Plan:   Principal Problem:   CHF exacerbation ([place]) Active Problems:   CKD (chronic kidney disease) stage 3, GFR 30-59 ml/min (HCC)   Elevated troponin   Restless leg syndrome   Type II diabetes mellitus (HCC)   Essential hypertension   Non-ischemic cardiomyopathy (HCC)   Hypokalemia  Acute on chronic systolic and diastolic heart failure -BNP 420.9 -Cardiology consulted -Continue Lasix 80 mg every 12 hours, added metolazone today  -Strict I's and O's, daily weight -Echocardiogram EF 25-30%, mild hypokinesis of basalanteroseptal myocardium, severe LVH  -Holding Coreg -Advanced heart failure team considering amyloid work up. PYP scan planned for Monday   Bilateral pleural effusions -In setting of CHF and cirrhosis -PCCM consulted, no thoracentesis planned for now. Follow CXR intermittently.   Chronically elevated troponin -Previously ranges from 0.2-0.3  -Flat in trend and not consistent with ACS   Cirrhosis secondary to NASH, with esophageal varices -Aldactone has been on hold since 3/4 per Dr. Ellyn Hack due to Cr 1.82 and K 5.4. Lasix as above   Chronic kidney disease stage III -Baseline  creatinine 1.5 -Stable   Type 2 diabetes with peripheral neuropathy -SSI  -Continue neurontin   GERD -Continue PPI  HLD -Continue crestor   Hypokalemia -Replace, trend   Hypomagnesemia -Replace, trend    DVT prophylaxis: Lovenox Code Status: Full code Family Communication: No family at bedside Disposition Plan: Pending clinical improvement, PYP scan Monday    Consultants:   Cardiology  PCCM  Advance heart failure   Procedures:   None   Antimicrobials:  Anti-infectives (From admission, onward)   None       Subjective: Doing well this morning without any new complaints.  Continues to have exertional shortness of breath, but feels well at rest.   Objective: Vitals:   06/27/17 2058 06/28/17 0500 06/28/17 0642 06/28/17 1136  BP: (!) 101/49  102/60 106/61  Pulse: 76  77 78  Resp: 18  18 18   Temp: 98.3 F (36.8 C)  97.8 F (36.6 C) 97.8 F (36.6 C)  TempSrc: Oral  Oral Oral  SpO2: 100%  96% 98%  Weight:  84 kg (185 lb 1.6 oz)    Height:        Intake/Output Summary (Last 24 hours) at 06/28/2017 1346 Last data filed at 06/28/2017 1109 Gross per 24 hour  Intake 820 ml  Output 1975 ml  Net -1155 ml   Filed Weights   06/26/17 0657 06/27/17 0447 06/28/17 0500  Weight: 84.6 kg (186 lb 6.4 oz) 83.3 kg (183 lb 11.2 oz) 84 kg (185 lb 1.6 oz)   Examination: General exam: Appears calm and comfortable  Respiratory system:  Respiratory effort normal. Diminished breath sounds  Cardiovascular system: S1 & S2 heard, RRR. No JVD, murmurs, rubs,  gallops or clicks. No pedal edema. Gastrointestinal system: Abdomen is nondistended, soft and nontender. No organomegaly or masses felt. Normal bowel sounds heard. Central nervous system: Alert and oriented. No focal neurological deficits. Extremities: Symmetric 5 x 5 power. Skin: No rashes, lesions or ulcers Psychiatry: Judgement and insight appear normal. Mood & affect appropriate.    Data Reviewed: I have  personally reviewed following labs and imaging studies  CBC: Recent Labs  Lab 06/25/17 1207 06/26/17 0738  WBC 7.3 8.0  NEUTROABS 4.8  --   HGB 12.2* 12.1*  HCT 38.6* 38.0*  MCV 85.6 87.0  PLT 169 360   Basic Metabolic Panel: Recent Labs  Lab 06/25/17 1207 06/26/17 0738 06/27/17 0743 06/28/17 0538  NA 141 143 145 145  K 3.3* 3.7 3.4* 3.4*  CL 100* 103 103 102  CO2 31 27 30 30   GLUCOSE 105* 116* 114* 119*  BUN 35* 28* 32* 36*  CREATININE 1.61* 1.62* 1.68* 1.69*  CALCIUM 8.9 9.1 9.3 9.2  MG  --  1.7 1.9 1.8  PHOS  --  3.3  --   --    GFR: Estimated Creatinine Clearance: 39 mL/min (A) (by C-G formula based on SCr of 1.69 mg/dL (H)). Liver Function Tests: Recent Labs  Lab 06/25/17 1207 06/26/17 0738  AST 44* 38  ALT 32 30  ALKPHOS 215* 190*  BILITOT 1.3* 1.6*  PROT 7.1 6.7  ALBUMIN 3.6 3.5   No results for input(s): LIPASE, AMYLASE in the last 168 hours. No results for input(s): AMMONIA in the last 168 hours. Coagulation Profile: No results for input(s): INR, PROTIME in the last 168 hours. Cardiac Enzymes: Recent Labs  Lab 06/25/17 1207 06/25/17 2045 06/26/17 0252 06/26/17 0738  TROPONINI 0.25* 0.27* 0.29* 0.34*   BNP (last 3 results) No results for input(s): PROBNP in the last 8760 hours. HbA1C: No results for input(s): HGBA1C in the last 72 hours. CBG: Recent Labs  Lab 06/27/17 1136 06/27/17 1654 06/27/17 2117 06/28/17 0756 06/28/17 1207  GLUCAP 141* 148* 148* 100* 148*   Lipid Profile: No results for input(s): CHOL, HDL, LDLCALC, TRIG, CHOLHDL, LDLDIRECT in the last 72 hours. Thyroid Function Tests: Recent Labs    06/26/17 0252  TSH 2.456   Anemia Panel: No results for input(s): VITAMINB12, FOLATE, FERRITIN, TIBC, IRON, RETICCTPCT in the last 72 hours. Sepsis Labs: No results for input(s): PROCALCITON, LATICACIDVEN in the last 168 hours.  No results found for this or any previous visit (from the past 240 hour(s)).     Radiology  Studies: No results found.    Scheduled Meds: . aspirin EC  81 mg Oral Daily  . enoxaparin (LOVENOX) injection  40 mg Subcutaneous Q24H  . furosemide  80 mg Intravenous Q12H  . gabapentin  800 mg Oral QHS  . insulin aspart  0-15 Units Subcutaneous TID WC  . insulin aspart  0-5 Units Subcutaneous QHS  . mirtazapine  7.5 mg Oral QHS  . pantoprazole  40 mg Oral Daily  . rosuvastatin  10 mg Oral Daily  . sodium chloride flush  3 mL Intravenous Q12H   Continuous Infusions: . sodium chloride       LOS: 3 days    Time spent: 20 minutes   Dessa Phi, DO [facility] [email or web address] Password [email or web address] 06/28/2017, 1:46 PM

## 2017-06-28 NOTE — Progress Notes (Signed)
Patient ID: Dillon Andrews, male   DOB: June 11, 1940, 77 y.o.   MRN: [id number]     Advanced Heart Failure Rounding Note  PCP-Cardiologist: Glenetta Hew, MD   Subjective:    Some diuresis yesterday but not marked.  Weight unchanged.  Still short of breath with exertion.    Objective:   Weight Range: 185 lb 1.6 oz (84 kg) Body mass index is 25.82 kg/m.   Vital Signs:   Temp:  [97.8 F (36.6 C)-98.3 F (36.8 C)] 97.8 F (36.6 C) (03/16 0642) Pulse Rate:  [75-80] 77 (03/16 0642) Resp:  [18] 18 (03/16 0642) BP: (100-105)/(49-66) 102/60 (03/16 0642) SpO2:  [95 %-100 %] 96 % (03/16 0642) Weight:  [185 lb 1.6 oz (84 kg)] 185 lb 1.6 oz (84 kg) (03/16 0500) Last BM Date: 06/27/17  Weight change: Filed Weights   06/26/17 0657 06/27/17 0447 06/28/17 0500  Weight: 186 lb 6.4 oz (84.6 kg) 183 lb 11.2 oz (83.3 kg) 185 lb 1.6 oz (84 kg)    Intake/Output:   Intake/Output Summary (Last 24 hours) at 06/28/2017 0859 Last data filed at 06/28/2017 0730 Gross per 24 hour  Intake 720 ml  Output 2475 ml  Net -1755 ml      Physical Exam    General:  Well appearing. No resp difficulty HEENT: Normal Neck: Supple. JVP 12-13 cm. No lymphadenopathy or thyromegaly appreciated. Cor: PMI lateral. Regular rate & rhythm. No rubs, gallops or murmurs. Lungs: Decreased breath sounds about 1/2 up lung fields bilaterally.  Abdomen: Soft, nontender, nondistended. No hepatosplenomegaly. No bruits or masses. Good bowel sounds. Extremities: No cyanosis, clubbing, rash, edema Neuro: Alert & orientedx3, cranial nerves grossly intact. moves all 4 extremities w/o difficulty. Affect pleasant   Telemetry   NSR (personally reviewed)  Labs    CBC Recent Labs    06/25/17 1207 06/26/17 0738  WBC 7.3 8.0  NEUTROABS 4.8  --   HGB 12.2* 12.1*  HCT 38.6* 38.0*  MCV 85.6 87.0  PLT 169 476   Basic Metabolic Panel Recent Labs    06/26/17 0738 06/27/17 0743 06/28/17 0538  NA 143 145 145  K 3.7  3.4* 3.4*  CL 103 103 102  CO2 27 30 30   GLUCOSE 116* 114* 119*  BUN 28* 32* 36*  CREATININE 1.62* 1.68* 1.69*  CALCIUM 9.1 9.3 9.2  MG 1.7 1.9 1.8  PHOS 3.3  --   --    Liver Function Tests Recent Labs    06/25/17 1207 06/26/17 0738  AST 44* 38  ALT 32 30  ALKPHOS 215* 190*  BILITOT 1.3* 1.6*  PROT 7.1 6.7  ALBUMIN 3.6 3.5   No results for input(s): LIPASE, AMYLASE in the last 72 hours. Cardiac Enzymes Recent Labs    06/25/17 2045 06/26/17 0252 06/26/17 0738  TROPONINI 0.27* 0.29* 0.34*    BNP: BNP (last 3 results) Recent Labs    11/19/16 1326 03/27/17 1215 06/25/17 1207  BNP 439.5* 489.5* 420.9*    ProBNP (last 3 results) No results for input(s): PROBNP in the last 8760 hours.   D-Dimer No results for input(s): DDIMER in the last 72 hours. Hemoglobin A1C No results for input(s): HGBA1C in the last 72 hours. Fasting Lipid Panel No results for input(s): CHOL, HDL, LDLCALC, TRIG, CHOLHDL, LDLDIRECT in the last 72 hours. Thyroid Function Tests Recent Labs    06/26/17 0252  TSH 2.456    Other results:   Imaging     No results found.   Medications:  Scheduled Medications: . aspirin EC  81 mg Oral Daily  . enoxaparin (LOVENOX) injection  40 mg Subcutaneous Q24H  . furosemide  80 mg Intravenous Q12H  . gabapentin  800 mg Oral QHS  . insulin aspart  0-15 Units Subcutaneous TID WC  . insulin aspart  0-5 Units Subcutaneous QHS  . metolazone  2.5 mg Oral Once  . mirtazapine  7.5 mg Oral QHS  . pantoprazole  40 mg Oral Daily  . potassium chloride  20 mEq Oral Once  . potassium chloride  40 mEq Oral Once  . rosuvastatin  10 mg Oral Daily  . sodium chloride flush  3 mL Intravenous Q12H     Infusions: . sodium chloride    . magnesium sulfate 1 - 4 g bolus IVPB       PRN Medications:  sodium chloride, acetaminophen **OR** acetaminophen, HYDROcodone-acetaminophen, ondansetron **OR** ondansetron (ZOFRAN) IV, sodium chloride flush,  zolpidem    Assessment/Plan   Dillon Andrews is a 77 y.o. male with a hx of chronic combined CHF, NICM, tricuspid regurgitation, mitral regurgitation, CKD III,presumedNASH(per pt, never bx proven),esophageal varices per chart(prior glass of wine occasionally but no heavy alcohol),HTN, HLD, DM, Barrett's esophagus, GERD, anemia, RBBB  HF team asked to see with concerns for amyloidosis.  1. Acute on chronic systolic CHF:  NICM by cath at [facility] 09/2016. Echo 06/26/17 LVEF 25-30%, Mild LAE, Mild/Mod RV dilation, Mod/Sev RA.  On exam, he is still volume overloaded.  He has not diuresed particularly vigorously and creatinine is stable.  Still short of breath with exertion.  There is concern for possible cardiac amyloidosis.  - Increase Lasix to 80 mg IV bid and will give a dose of metolazone 2.5 mg po x 1 today.  - Spironolactone listed as allergy due to AKI/HyperK in 04/2017. Will follow K this admission, may restart low dose.  - Coreg held for now with decompensation.  - Consider low dose ARB but pressures soft in low 100s still. Poor candidate for Entresto with marked hyper K and AKI on spiro.  - Amyloid workup: Creatinine too high for cMRI. Will arrange for PYP scan (Monday if in hospital or as outpatient), SPEP/UPEP sent.  2. Recurrent bilateral pleural effusions: Suspect from CHF.  - Repeat CXR PA/lateral tomorrow, if still significant and he remains dyspneic, would favor thoracentesis.  3. NSVT: No recurrence.  4. CKD Stage III: Creatinine stable at 1.6. Out of range for cMRI, so will order TcPYP scan with concerns for cardiac amyloid.  5. Mitral regurgitation: TEE June 2018 [facility]:Mild global LV hypokinesis. Moderate MR. Moderate TR.  MR not visualized (or commented on) on Echo this admit.   Length of Stay: 3  Loralie Champagne, MD  06/28/2017, 8:59 AM  Advanced Heart Failure Team Pager [phone] (M-F; [place])  Please contact [facility] for night-coverage after hours (4p -7a ) and  weekends on [email or web address]

## 2017-06-28 NOTE — Progress Notes (Signed)
Pt is alert and oriented up walking no distress. Irregular wide QRS complex 6 runs. Potassium was low and replaced today.

## 2017-06-29 ENCOUNTER — Inpatient Hospital Stay (HOSPITAL_COMMUNITY): Payer: Medicare HMO

## 2017-06-29 LAB — GLUCOSE, CAPILLARY: Glucose-Capillary: 107 mg/dL — ABNORMAL HIGH (ref 65–99)

## 2017-06-29 LAB — CBC WITH DIFFERENTIAL/PLATELET
BASOS ABS: 0 10*3/uL (ref 0.0–0.1)
BASOS PCT: 0 %
EOS ABS: 0.2 10*3/uL (ref 0.0–0.7)
EOS PCT: 2 %
HCT: 38.3 % — ABNORMAL LOW (ref 39.0–52.0)
Hemoglobin: 12 g/dL — ABNORMAL LOW (ref 13.0–17.0)
Lymphocytes Relative: 24 %
Lymphs Abs: 1.9 10*3/uL (ref 0.7–4.0)
MCH: 27.4 pg (ref 26.0–34.0)
MCHC: 31.3 g/dL (ref 30.0–36.0)
MCV: 87.4 fL (ref 78.0–100.0)
MONO ABS: 0.7 10*3/uL (ref 0.1–1.0)
Monocytes Relative: 8 %
Neutro Abs: 5.4 10*3/uL (ref 1.7–7.7)
Neutrophils Relative %: 66 %
PLATELETS: 183 10*3/uL (ref 150–400)
RBC: 4.38 MIL/uL (ref 4.22–5.81)
RDW: 20.9 % — AB (ref 11.5–15.5)
WBC: 8.1 10*3/uL (ref 4.0–10.5)

## 2017-06-29 LAB — BASIC METABOLIC PANEL
Anion gap: 14 (ref 5–15)
BUN: 39 mg/dL — AB (ref 6–20)
CALCIUM: 9.2 mg/dL (ref 8.9–10.3)
CO2: 31 mmol/L (ref 22–32)
CREATININE: 1.73 mg/dL — AB (ref 0.61–1.24)
Chloride: 98 mmol/L — ABNORMAL LOW (ref 101–111)
GFR, EST AFRICAN AMERICAN: 42 mL/min — AB (ref >60)
GFR, EST NON AFRICAN AMERICAN: 36 mL/min — AB (ref >60)
Glucose, Bld: 119 mg/dL — ABNORMAL HIGH (ref 65–99)
Potassium: 3.2 mmol/L — ABNORMAL LOW (ref 3.5–5.1)
SODIUM: 143 mmol/L (ref 135–145)

## 2017-06-29 LAB — ANTINUCLEAR ANTIBODIES, IFA: ANTINUCLEAR ANTIBODIES, IFA: NEGATIVE

## 2017-06-29 LAB — MAGNESIUM: MAGNESIUM: 1.8 mg/dL (ref 1.7–2.4)

## 2017-06-29 MED ORDER — POTASSIUM CHLORIDE CRYS ER 20 MEQ PO TBCR
20.0000 meq | EXTENDED_RELEASE_TABLET | Freq: Once | ORAL | Status: AC
  Administered 2017-06-29: 20 meq via ORAL
  Filled 2017-06-29: qty 1

## 2017-06-29 MED ORDER — SPIRONOLACTONE 25 MG PO TABS: 12.5000 mg | ORAL_TABLET | Freq: Every day | ORAL | 0 refills | Status: DC

## 2017-06-29 MED ORDER — SPIRONOLACTONE 12.5 MG HALF TABLET
12.5000 mg | ORAL_TABLET | Freq: Every day | ORAL | Status: DC
  Administered 2017-06-29: 12.5 mg via ORAL
  Filled 2017-06-29: qty 1

## 2017-06-29 MED ORDER — FUROSEMIDE 40 MG PO TABS: 40.0000 mg | ORAL_TABLET | Freq: Every evening | ORAL | Status: DC

## 2017-06-29 MED ORDER — FUROSEMIDE 80 MG PO TABS: 80.0000 mg | ORAL_TABLET | Freq: Every day | ORAL | Status: DC

## 2017-06-29 MED ORDER — CARVEDILOL 3.125 MG PO TABS: 3.1250 mg | ORAL_TABLET | Freq: Two times a day (BID) | ORAL | Status: DC

## 2017-06-29 MED ORDER — POTASSIUM CHLORIDE CRYS ER 20 MEQ PO TBCR
40.0000 meq | EXTENDED_RELEASE_TABLET | Freq: Once | ORAL | Status: AC
  Administered 2017-06-29: 40 meq via ORAL

## 2017-06-29 MED ORDER — FUROSEMIDE 40 MG PO TABS
40.0000 mg | ORAL_TABLET | Freq: Two times a day (BID) | ORAL | 0 refills | Status: DC
Start: 2017-06-29 — End: 2017-07-08

## 2017-06-29 NOTE — Progress Notes (Signed)
Pt discharged home. IV and tele removed. Discharge instructions given. Pt to call for appts. For follow up outpatient. Questions answered. Son to take pt home.

## 2017-06-29 NOTE — Progress Notes (Signed)
Patient ID: Dillon Andrews, male   DOB: 02-23-1941, 77 y.o.   MRN: [id number]     Advanced Heart Failure Rounding Note  PCP-Cardiologist: Glenetta Hew, MD   Subjective:    Good diuresis yesterday, weight down 5 lbs.  Breathing better.    CXR today with left basilar atelectasis and small left pleural effusion.    Objective:   Weight Range: 180 lb (81.6 kg) Body mass index is 25.1 kg/m.   Vital Signs:   Temp:  [97.4 F (36.3 C)-98.6 F (37 C)] 97.4 F (36.3 C) (03/17 0606) Pulse Rate:  [78-86] 79 (03/17 0701) Resp:  [18] 18 (03/17 0701) BP: (94-112)/(61-73) 112/66 (03/17 0701) SpO2:  [94 %-98 %] 95 % (03/17 0606) Weight:  [180 lb (81.6 kg)] 180 lb (81.6 kg) (03/17 0606) Last BM Date: 06/29/17  Weight change: Filed Weights   06/27/17 0447 06/28/17 0500 06/29/17 0606  Weight: 183 lb 11.2 oz (83.3 kg) 185 lb 1.6 oz (84 kg) 180 lb (81.6 kg)    Intake/Output:   Intake/Output Summary (Last 24 hours) at 06/29/2017 0954 Last data filed at 06/29/2017 0950 Gross per 24 hour  Intake 820 ml  Output 3225 ml  Net -2405 ml      Physical Exam    General: NAD Neck: No JVD, no thyromegaly or thyroid nodule.  Lungs: Decreased breath sounds at bases bilaterally.  CV: Nondisplaced PMI.  Heart regular S1/S2, no S3/S4, no murmur.  No peripheral edema.   Abdomen: Soft, nontender, no hepatosplenomegaly, no distention.  Skin: Intact without lesions or rashes.  Neurologic: Alert and oriented x 3.  Psych: Normal affect. Extremities: No clubbing or cyanosis.  HEENT: Normal.    Telemetry   NSR (personally reviewed)  Labs    CBC Recent Labs    06/29/17 0635  WBC 8.1  NEUTROABS 5.4  HGB 12.0*  HCT 38.3*  MCV 87.4  PLT 893   Basic Metabolic Panel Recent Labs    06/28/17 0538 06/29/17 0635  NA 145 143  K 3.4* 3.2*  CL 102 98*  CO2 30 31  GLUCOSE 119* 119*  BUN 36* 39*  CREATININE 1.69* 1.73*  CALCIUM 9.2 9.2  MG 1.8 1.8   Liver Function Tests No results for  input(s): AST, ALT, ALKPHOS, BILITOT, PROT, ALBUMIN in the last 72 hours. No results for input(s): LIPASE, AMYLASE in the last 72 hours. Cardiac Enzymes No results for input(s): CKTOTAL, CKMB, CKMBINDEX, TROPONINI in the last 72 hours.  BNP: BNP (last 3 results) Recent Labs    11/19/16 1326 03/27/17 1215 06/25/17 1207  BNP 439.5* 489.5* 420.9*    ProBNP (last 3 results) No results for input(s): PROBNP in the last 8760 hours.   D-Dimer No results for input(s): DDIMER in the last 72 hours. Hemoglobin A1C No results for input(s): HGBA1C in the last 72 hours. Fasting Lipid Panel No results for input(s): CHOL, HDL, LDLCALC, TRIG, CHOLHDL, LDLDIRECT in the last 72 hours. Thyroid Function Tests No results for input(s): TSH, T4TOTAL, T3FREE, THYROIDAB in the last 72 hours.  Invalid input(s): FREET3  Other results:   Imaging    Dg Chest 2 View  Result Date: 06/29/2017 CLINICAL DATA:  Pleural effusion EXAM: CHEST - 2 VIEW COMPARISON:  06/25/2017 FINDINGS: Normal cardiac silhouette. LEFT basilar atelectasis. Probable small effusion. Overall lungs are hyperinflated. No pulmonary edema. No pneumothorax. IMPRESSION: LEFT basilar atelectasis small effusions.  No significant change Electronically Signed   By: Suzy Bouchard M.D.   On: 06/29/2017 08:41  Medications:     Scheduled Medications: . aspirin EC  81 mg Oral Daily  . carvedilol  3.125 mg Oral BID WC  . enoxaparin (LOVENOX) injection  40 mg Subcutaneous Q24H  . furosemide  40 mg Oral QPM  . [START ON 06/30/2017] furosemide  80 mg Oral Q breakfast  . gabapentin  800 mg Oral QHS  . insulin aspart  0-15 Units Subcutaneous TID WC  . insulin aspart  0-5 Units Subcutaneous QHS  . mirtazapine  7.5 mg Oral QHS  . pantoprazole  40 mg Oral Daily  . rosuvastatin  10 mg Oral Daily  . sodium chloride flush  3 mL Intravenous Q12H    Infusions: . sodium chloride      PRN Medications: sodium chloride, acetaminophen **OR**  acetaminophen, HYDROcodone-acetaminophen, ondansetron **OR** ondansetron (ZOFRAN) IV, sodium chloride flush, zolpidem    Assessment/Plan   Dillon Andrews is a 77 y.o. male with a hx of chronic combined CHF, NICM, tricuspid regurgitation, mitral regurgitation, CKD III,presumedNASH(per pt, never bx proven),esophageal varices per chart(prior glass of wine occasionally but no heavy alcohol),HTN, HLD, DM, Barrett's esophagus, GERD, anemia, RBBB  HF team asked to see with concerns for amyloidosis.  1. Acute on chronic systolic CHF:  NICM by cath at [facility] 09/2016. Echo 06/26/17 LVEF 25-30%, Mild LAE, Mild/Mod RV dilation, Mod/Sev RA.  There is concern for possible cardiac amyloidosis.  Good diuresis yesterday, looks near-euvolemic today.  - Transition to Lasix 80 mg qam/40 qpm.  - Spironolactone listed as allergy due to AKI/HyperK in 04/2017. Can start low dose spironolactone 12.5 daily, K has been low this admission.   - Can restart home Coreg 6.25 mg bid at discharge.  - Will hold off on ARB/Entresto at this time with soft BP and creatinine elevated.  - Amyloid workup: Creatinine too high for cMRI. Will arrange for PYP scan (If he goes home today, can do as outpatient), SPEP/UPEP sent.  2. Recurrent bilateral pleural effusions: Suspect from CHF. CXR  (PA/lateral) showed small left effusion, not amenable to thoracentesis.  3. NSVT: No recurrence.  4. CKD Stage III: Creatinine fairly stable at 1.73. Out of range for cMRI, so will order TcPYP scan with concerns for cardiac amyloid (can do as outpatient).  5. Mitral regurgitation: TEE June 2018 [facility]:Mild global LV hypokinesis. Moderate MR. Moderate TR.  MR not visualized (or commented on) on Echo this admit.  6. Disposition: I think he can go home today.  Followup in CHF clinic in 10 days.  Needs PYP nuclear scan ordered for amyloidosis as outpatient.  Cardiac meds for home: Lasix 80 qam/40 qpm, spironolactone 12.5 daily, Coreg 6.25 mg bid, ASA 81  daily, Crestor 10 daily.   Length of Stay: [place], MD  06/29/2017, 9:54 AM  Advanced Heart Failure Team Pager [phone] (M-F; 7a - 4p)  Please contact [facility] for night-coverage after hours (4p -7a ) and weekends on [email or web address]

## 2017-06-29 NOTE — Discharge Summary (Signed)
Physician Discharge Summary  Dillon Andrews:[id number] DOB: 1940/12/17 DOA: 06/25/2017  PCP: Dillon Maw, MD  Admit date: 06/25/2017 Discharge date: 06/29/2017  Admitted From: Home Disposition:  Home   Recommendations for Outpatient Follow-up:  1. Follow up with PCP in 1 week 2. Follow up with CHF clinic in 10 days  3. Follow up with Dr. Lavonna Andrews   4. Please obtain BMP/Mg in 1 week   Discharge Condition: Stable CODE STATUS: Full  Diet recommendation: Heart healthy   Brief/Interim Summary: Dillon Andrews is a 77 yo male with past medical history significant for combined systolic, diastolic heart failure, chronic kidney disease stage III, type 2 diabetes, esophageal varices secondary to cirrhosis secondary to [place], GERD, hypertension, hyperlipidemia.  He presents with 2-3-day history of worsening shortness of breath, especially with exertion.  His daughter called cardiologist who recommended patient to be evaluated.  He went to [place].  Chest x-ray revealed cardiomegaly, pleural effusions and basilar atelectasis.  He was transferred to [facility] for further evaluation and treatment of acute CHF.  She was diuresed with IV Lasix, cardiology was consulted.  Due to concern for amyloidosis heart disease, her advanced heart failure team was consulted.  They considered doing inpatient PYP scan.  This can be arranged as outpatient.  He continued to diurese well, remained stable.  On day of discharge, he was feeling well, denied any shortness of breath or chest pain.  No peripheral edema.  Discharge Diagnoses:  Principal Problem:   CHF exacerbation ([place]) Active Problems:   CKD (chronic kidney disease) stage 3, GFR 30-59 ml/min (HCC)   Elevated troponin   Restless leg syndrome   Type II diabetes mellitus (HCC)   Essential hypertension   Non-ischemic cardiomyopathy (HCC)   Hypokalemia  Acute on chronic systolic and diastolic heart failure -BNP  420.9 -Cardiology consulted/Advance heart failure consulted for ?amyoloidosis  -Strict I's and O's, daily weight -Echocardiogram EF 25-30%, mild hypokinesis of basalanteroseptal myocardium, severe LVH  -Advanced heart failure team considering amyloid work up. PYP scan planned as outpatient  -Diuresed well with IV lasix --> PO lasix 79m qAM, 437mqPM  -Resume spironolactone 12.68m58maily -Resume coreg 6.268m50mD   Bilateral pleural effusions -In setting of CHF and cirrhosis -PCCM consulted, no thoracentesis planned for now. Follow CXR intermittently.  -Repeat CXR with small pleural effusion   Chronically elevated troponin -Previously ranges from 0.2-0.3  -Flat in trend and not consistent with ACS   Cirrhosis secondary to NASH, with esophageal varices -Aldactone has been on hold since 3/4 per Dr. HardEllyn Andrews to Cr 1.82 and K 5.4. Lasix as above. Resume low dose aldactone.   Chronic kidney disease stage III -Baseline creatinine 1.5 -Stable   Type 2 diabetes with peripheral neuropathy -SSI  -Continue neurontin   GERD -Continue PPI  HLD -Continue crestor   Hypokalemia -Replacee, trend    Discharge Instructions  Discharge Instructions    (HEART FAILURE PATIENTS) Call MD:  Anytime you have any of the following symptoms: 1) 3 pound weight gain in 24 hours or 5 pounds in 1 week 2) shortness of breath, with or without a dry hacking cough 3) swelling in the hands, feet or stomach 4) if you have to sleep on extra pillows at night in order to breathe.   Complete by:  As directed    Call MD for:  difficulty breathing, headache or visual disturbances   Complete by:  As directed    Call MD  for:  extreme fatigue   Complete by:  As directed    Call MD for:  hives   Complete by:  As directed    Call MD for:  persistant dizziness or light-headedness   Complete by:  As directed    Call MD for:  persistant nausea and vomiting   Complete by:  As directed    Call MD for:   severe uncontrolled pain   Complete by:  As directed    Call MD for:  temperature >100.4   Complete by:  As directed    Diet - low sodium heart healthy   Complete by:  As directed    Discharge instructions   Complete by:  As directed    You were cared for by a hospitalist during your hospital stay. If you have any questions about your discharge medications or the care you received while you were in the hospital after you are discharged, you can call the unit and asked to speak with the hospitalist on call if the hospitalist that took care of you is not available. Once you are discharged, your primary care physician will handle any further medical issues. Please note that NO REFILLS for any discharge medications will be authorized once you are discharged, as it is imperative that you return to your primary care physician (or establish a relationship with a primary care physician if you do not have one) for your aftercare needs so that they can reassess your need for medications and monitor your lab values.   Increase activity slowly   Complete by:  As directed      Allergies as of 06/29/2017      Reactions   Spironolactone    After initiation in 04/2017, had hyperkalemia on follow-up labwork so FYI      Medication List    STOP taking these medications   potassium chloride SA 20 MEQ tablet Commonly known as:  K-DUR,KLOR-CON     TAKE these medications   aspirin EC 81 MG tablet Take 81 mg by mouth daily.   carvedilol 6.25 MG tablet Commonly known as:  COREG Take 1 tablet (6.25 mg total) by mouth 2 (two) times daily with a meal.   furosemide 40 MG tablet Commonly known as:  LASIX Take 1-2 tablets (40-80 mg total) by mouth 2 (two) times daily. Take 64m (2 tablets) in the morning, take 457m(1 tablet) in the evening What changed:    medication strength  how much to take  how to take this  when to take this  additional instructions   gabapentin 800 MG tablet Commonly known  as:  NEURONTIN Take 1 tablet (800 mg total) by mouth at bedtime.   glipiZIDE 5 MG tablet Commonly known as:  GLUCOTROL Take 2.5 mg by mouth 2 (two) times daily.   glucose blood test strip Use as instructed   mirtazapine 7.5 MG tablet Commonly known as:  REMERON Take 1 tablet (7.5 mg total) by mouth at bedtime.   omeprazole 40 MG capsule Commonly known as:  PRILOSEC Take 40 mg by mouth daily.   rosuvastatin 10 MG tablet Commonly known as:  CRESTOR Take 10 mg by mouth daily.   spironolactone 25 MG tablet Commonly known as:  ALDACTONE Take 0.5 tablets (12.5 mg total) by mouth daily. What changed:  how much to take   vitamin B-12 500 MCG tablet Commonly known as:  CYANOCOBALAMIN Take 500 mcg by mouth every other day.      Follow-up  Information    Dillon Maw, MD. Schedule an appointment as soon as possible for a visit in 1 week(s).   Specialty:  Family Medicine Contact information: [place] [address] [phone]        Leonie Man, MD Follow up.   Specialty:  Cardiology Contact information: [address] [place] [address] [phone]        [place]. Schedule an appointment as soon as possible for a visit in 10 day(s).   Specialty:  Cardiology Contact information: [address] [id number] [place] [address] [phone]       Brand Males, MD. Schedule an appointment as soon as possible for a visit in 1 week(s).   Specialty:  Pulmonary Disease Contact information: [place] [address] [phone]          Allergies  Allergen Reactions  . Spironolactone     After initiation in 04/2017, had hyperkalemia on follow-up labwork so FYI    Consultations:  Cardiology/Advanced Heart Failure  PCCM    Procedures/Studies: Dg Chest 2 View  Result Date: 06/29/2017 CLINICAL DATA:  Pleural  effusion EXAM: CHEST - 2 VIEW COMPARISON:  06/25/2017 FINDINGS: Normal cardiac silhouette. LEFT basilar atelectasis. Probable small effusion. Overall lungs are hyperinflated. No pulmonary edema. No pneumothorax. IMPRESSION: LEFT basilar atelectasis small effusions.  No significant change Electronically Signed   By: Suzy Bouchard M.D.   On: 06/29/2017 08:41   Dg Chest 2 View  Result Date: 06/25/2017 CLINICAL DATA:  Progressively worsening shortness of breath. EXAM: CHEST - 2 VIEW COMPARISON:  03/27/2017 FINDINGS: Cardiomegaly as seen previously. Aortic atherosclerosis. Bilateral pleural effusions layering dependently with dependent pulmonary atelectasis. Upper lungs are clear. No acute bone finding. IMPRESSION: Cardiomegaly, aortic atherosclerosis, pleural effusions and basilar atelectasis. Findings most consistent with chronic congestive heart failure. Electronically Signed   By: Nelson Chimes M.D.   On: 06/25/2017 12:03    Echo 3/14 Study Conclusions  - Left ventricle: The cavity size was normal. Wall thickness was   increased in a pattern of severe LVH. Systolic function was   severely reduced. The estimated ejection fraction was in the   range of 25% to 30%. Mild hypokinesis of the basalanteroseptal   myocardium. - Aortic valve: Mildly calcified annulus. Trileaflet; normal   thickness, mildly calcified leaflets. - Left atrium: The atrium was mildly dilated. - Right ventricle: The cavity size was mildly to moderately   dilated. Wall thickness was normal. - Right atrium: The atrium was moderately to severely dilated.    Discharge Exam: Vitals:   06/29/17 0606 06/29/17 0701  BP: 94/73 112/66  Pulse: 86 79  Resp: 18 18  Temp: (!) 97.4 F (36.3 C)   SpO2: 95%     General: Pt is alert, awake, not in acute distress Cardiovascular: RRR, S1/S2 +, no rubs, no gallops Respiratory: Diminished bases, no wheezing, no rhonchi Abdominal: Soft, NT, ND, bowel sounds + Extremities: no edema,  no cyanosis    The results of significant diagnostics from this hospitalization (including imaging, microbiology, ancillary and laboratory) are listed below for reference.     Microbiology: No results found for this or any previous visit (from the past 240 hour(s)).   Labs: BNP (last 3 results) Recent Labs    11/19/16 1326 03/27/17 1215 06/25/17 1207  BNP 439.5* 489.5* 115.7*   Basic Metabolic Panel: Recent Labs  Lab 06/25/17 1207 06/26/17 0738 06/27/17  1700 06/28/17 0538 06/29/17 0635  NA 141 143 145 145 143  K 3.3* 3.7 3.4* 3.4* 3.2*  CL 100* 103 103 102 98*  CO2 31 27 30 30 31   GLUCOSE 105* 116* 114* 119* 119*  BUN 35* 28* 32* 36* 39*  CREATININE 1.61* 1.62* 1.68* 1.69* 1.73*  CALCIUM 8.9 9.1 9.3 9.2 9.2  MG  --  1.7 1.9 1.8 1.8  PHOS  --  3.3  --   --   --    Liver Function Tests: Recent Labs  Lab 06/25/17 1207 06/26/17 0738  AST 44* 38  ALT 32 30  ALKPHOS 215* 190*  BILITOT 1.3* 1.6*  PROT 7.1 6.7  ALBUMIN 3.6 3.5   No results for input(s): LIPASE, AMYLASE in the last 168 hours. No results for input(s): AMMONIA in the last 168 hours. CBC: Recent Labs  Lab 06/25/17 1207 06/26/17 0738 06/29/17 0635  WBC 7.3 8.0 8.1  NEUTROABS 4.8  --  5.4  HGB 12.2* 12.1* 12.0*  HCT 38.6* 38.0* 38.3*  MCV 85.6 87.0 87.4  PLT 169 163 183   Cardiac Enzymes: Recent Labs  Lab 06/25/17 1207 06/25/17 2045 06/26/17 0252 06/26/17 0738  TROPONINI 0.25* 0.27* 0.29* 0.34*   BNP: Invalid input(s): POCBNP CBG: Recent Labs  Lab 06/28/17 0756 06/28/17 1207 06/28/17 1638 06/28/17 2150 06/29/17 0747  GLUCAP 100* 148* 101* 146* 107*   D-Dimer No results for input(s): DDIMER in the last 72 hours. Hgb A1c No results for input(s): HGBA1C in the last 72 hours. Lipid Profile No results for input(s): CHOL, HDL, LDLCALC, TRIG, CHOLHDL, LDLDIRECT in the last 72 hours. Thyroid function studies No results for input(s): TSH, T4TOTAL, T3FREE, THYROIDAB in the last 72  hours.  Invalid input(s): FREET3 Anemia work up No results for input(s): VITAMINB12, FOLATE, FERRITIN, TIBC, IRON, RETICCTPCT in the last 72 hours. Urinalysis    Component Value Date/Time   COLORURINE YELLOW 11/19/2016 1326   APPEARANCEUR CLEAR 11/19/2016 1326   LABSPEC 1.006 11/19/2016 1326   PHURINE 7.5 11/19/2016 1326   GLUCOSEU NEGATIVE 11/19/2016 1326   HGBUR NEGATIVE 11/19/2016 1326   BILIRUBINUR NEGATIVE 11/19/2016 1326   KETONESUR NEGATIVE 11/19/2016 1326   PROTEINUR NEGATIVE 11/19/2016 1326   UROBILINOGEN 1.0 06/06/2008 0949   NITRITE NEGATIVE 11/19/2016 1326   LEUKOCYTESUR NEGATIVE 11/19/2016 1326   Sepsis Labs Invalid input(s): PROCALCITONIN,  WBC,  LACTICIDVEN Microbiology No results found for this or any previous visit (from the past 240 hour(s)).   Patient was seen and examined on the day of discharge and was found to be in stable condition. Time coordinating discharge: 25 minutes including assessment and coordination of care, as well as examination of the patient.   SIGNED:  Dessa Phi, DO [facility] Pager [phone]  If 7PM-7AM, please contact night-coverage [email or web address] Password [email or web address] 06/29/2017, 10:40 AM

## 2017-06-30 ENCOUNTER — Ambulatory Visit (HOSPITAL_COMMUNITY)
Admission: RE | Admit: 2017-06-30 | Discharge: 2017-06-30 | Disposition: A | Discharge status: Home / Self Care | Payer: Medicare HMO | Source: Ambulatory Visit | Attending: Pulmonary Disease | Admitting: Pulmonary Disease

## 2017-06-30 ENCOUNTER — Telehealth (HOSPITAL_COMMUNITY): Payer: Self-pay

## 2017-06-30 ENCOUNTER — Telehealth: Payer: Self-pay | Admitting: Behavioral Health

## 2017-06-30 ENCOUNTER — Telehealth (HOSPITAL_COMMUNITY): Payer: Self-pay | Admitting: *Deleted

## 2017-06-30 DIAGNOSIS — Z87891 Personal history of nicotine dependence: Secondary | ICD-10-CM | POA: Diagnosis not present

## 2017-06-30 DIAGNOSIS — I509 Heart failure, unspecified: Secondary | ICD-10-CM | POA: Diagnosis not present

## 2017-06-30 DIAGNOSIS — J449 Chronic obstructive pulmonary disease, unspecified: Secondary | ICD-10-CM | POA: Diagnosis not present

## 2017-06-30 DIAGNOSIS — E859 Amyloidosis, unspecified: Secondary | ICD-10-CM

## 2017-06-30 LAB — PULMONARY FUNCTION TEST
DL/VA % PRED: 80 %
DL/VA: 3.77 ml/min/mmHg/L
DLCO COR: 16.59 ml/min/mmHg
DLCO cor % pred: 49 %
DLCO unc % pred: 45 %
DLCO unc: 15.24 ml/min/mmHg
FEF 25-75 Post: 1.42 L/sec
FEF 25-75 Pre: 1.52 L/sec
FEF2575-%CHANGE-POST: -6 %
FEF2575-%PRED-PRE: 69 %
FEF2575-%Pred-Post: 65 %
FEV1-%CHANGE-POST: -1 %
FEV1-%PRED-PRE: 68 %
FEV1-%Pred-Post: 67 %
FEV1-Post: 2.08 L
FEV1-Pre: 2.12 L
FEV1FVC-%CHANGE-POST: -1 %
FEV1FVC-%Pred-Pre: 104 %
FEV6-%Change-Post: 0 %
FEV6-%PRED-PRE: 69 %
FEV6-%Pred-Post: 69 %
FEV6-PRE: 2.78 L
FEV6-Post: 2.8 L
FEV6FVC-%Change-Post: 0 %
FEV6FVC-%PRED-PRE: 104 %
FEV6FVC-%Pred-Post: 105 %
FVC-%Change-Post: 0 %
FVC-%PRED-POST: 65 %
FVC-%PRED-PRE: 65 %
FVC-POST: 2.81 L
FVC-PRE: 2.82 L
POST FEV6/FVC RATIO: 100 %
Post FEV1/FVC ratio: 74 %
Pre FEV1/FVC ratio: 75 %
Pre FEV6/FVC Ratio: 99 %
RV % PRED: 84 %
RV: 2.25 L
TLC % pred: 72 %
TLC: 5.24 L

## 2017-06-30 LAB — PROTEIN ELECTROPHORESIS, SERUM
A/G RATIO SPE: 1 (ref 0.7–1.7)
ALBUMIN ELP: 3.4 g/dL (ref 2.9–4.4)
ALPHA-1-GLOBULIN: 0.3 g/dL (ref 0.0–0.4)
ALPHA-2-GLOBULIN: 0.7 g/dL (ref 0.4–1.0)
Beta Globulin: 1 g/dL (ref 0.7–1.3)
GLOBULIN, TOTAL: 3.4 g/dL (ref 2.2–3.9)
Gamma Globulin: 1.4 g/dL (ref 0.4–1.8)
M-Spike, %: 0.8 g/dL — ABNORMAL HIGH
Total Protein ELP: 6.8 g/dL (ref 6.0–8.5)

## 2017-06-30 LAB — IMMUNOFIXATION, URINE

## 2017-06-30 MED ORDER — ALBUTEROL SULFATE (2.5 MG/3ML) 0.083% IN NEBU
2.5000 mg | INHALATION_SOLUTION | Freq: Once | RESPIRATORY_TRACT | Status: AC
  Administered 2017-06-30: 2.5 mg via RESPIRATORY_TRACT

## 2017-06-30 NOTE — Telephone Encounter (Signed)
-----   Message from Larey Dresser, MD sent at 06/29/2017 10:01 AM EDT ----- Needs followup in CHF clinic 10 days or so.  Needs PYP amyloid scan ordered as outpatient, preferably prior to clinic visit.

## 2017-06-30 NOTE — Telephone Encounter (Signed)
Order for PYP placed, will schedule appts.

## 2017-06-30 NOTE — Telephone Encounter (Signed)
Result Notes for Protein electrophoresis, serum   Notes recorded by Effie Berkshire, RN on 06/30/2017 at 4:01 PM EDT Patient's wife aware and agreeable to referral, referral message sent to office manager Doris ------  Notes recorded by Shirley Friar, PA-C on 06/30/2017 at 3:23 PM EDT Needs follow up with Heme/Onc [place] ASAP.

## 2017-06-30 NOTE — Telephone Encounter (Signed)
Per patient's wife, he's currently at a doctor's appointment & not sure of the time he's to return home. RN informed Mrs. Lieske that she will call the patient back on tomorrow.

## 2017-07-02 ENCOUNTER — Telehealth: Payer: Self-pay | Admitting: Cardiology

## 2017-07-02 ENCOUNTER — Encounter (HOSPITAL_COMMUNITY)
Admission: RE | Admit: 2017-07-02 | Discharge: 2017-07-02 | Disposition: A | Discharge status: Home / Self Care | Payer: Medicare HMO | Source: Ambulatory Visit | Attending: Cardiology | Admitting: Cardiology

## 2017-07-02 ENCOUNTER — Telehealth (HOSPITAL_COMMUNITY): Payer: Self-pay

## 2017-07-02 DIAGNOSIS — E859 Amyloidosis, unspecified: Secondary | ICD-10-CM | POA: Diagnosis not present

## 2017-07-02 DIAGNOSIS — I509 Heart failure, unspecified: Secondary | ICD-10-CM | POA: Diagnosis not present

## 2017-07-02 MED ORDER — TECHNETIUM TC 99M PYROPHOSPHATE
20.9000 | Freq: Once | INTRAVENOUS | Status: AC
  Administered 2017-07-02: 20.9 via INTRAVENOUS
  Filled 2017-07-02: qty 21

## 2017-07-02 NOTE — Telephone Encounter (Signed)
New Message    Pt c/o medication issue:  1. Name of Medication:  lasik  2. How are you currently taking this medication (dosage and times per day)?  Taking 120 mg a day   3. Are you having a reaction (difficulty breathing--STAT)?  no  4. What is your medication issue?  Not urinating , having sob when he walks, swelled up everywhere, he was discharged Sunday   Pt c/o swelling: STAT is pt has developed SOB within 24 hours  1) How much weight have you gained and in what time span?  Not more than 2lbs  2) If swelling, where is the swelling located? Everywhere   3) Are you currently taking a fluid pill? yes  4) Are you currently SOB? Yes   5) Do you have a log of your daily weights (if so, list)? no  6) Have you gained 3 pounds in a day or 5 pounds in a week? no  7) Have you traveled recently?  no

## 2017-07-02 NOTE — Telephone Encounter (Signed)
Notes recorded by Scarlette Calico, RN on 07/02/2017 at 4:06 PM EDT Patient is sch to see Dr Aundra Dubin on 3/27 for post hosp appointment ------  Notes recorded by Larey Dresser, MD on 07/02/2017 at 3:36 PM EDT Suggestive of transthyretin amyloidosis.

## 2017-07-02 NOTE — Telephone Encounter (Signed)
SPOKE TO DAUGHTER . DAUGHTER IS AT PATIENT 'S home.  patient states he has not been voiding very much since being discharge from the hospital on Sunday. Daily home  Weight  Monday -- 178.6 lb             Tuesday   181.2 lb           Wednesday  183 lbs Per patient and daughter - shortness of breathe with walking -( nothing new). No noticeable edema in lower extremities or abd.  blood pressure at present 105/79 and 112/80  Pulse 71.    Informed  Daughter to have patient take an extra 40 mg this afternoon. Per protocol. Patient has already taken his daily dose 80 mg this morning and 40 mg at 2 pm.  informed daughter will defer the heart failure clinic for further instructions. patient has an appointment on 07/11/17. Daughter is aware it maybe tomorrow before a response. Verbalized understanding.

## 2017-07-03 ENCOUNTER — Other Ambulatory Visit: Payer: Self-pay

## 2017-07-03 ENCOUNTER — Telehealth: Payer: Self-pay | Admitting: Family Medicine

## 2017-07-03 NOTE — Telephone Encounter (Signed)
   Please call and follow regarding shortness of breath.   Instruct him to take 80 mg of lasix twice a day for the next 2 days.    If no improvement instruct them to call HF clinic for additional recommendations.   Amy Clegg NP-C  2:28 PM

## 2017-07-03 NOTE — Telephone Encounter (Signed)
Called and spoke with patient he is aware and agreeable with plan.

## 2017-07-03 NOTE — Telephone Encounter (Signed)
Patient should continue Glipizide right? I'm thinking the pharmacy sent the refill request to your old office.

## 2017-07-03 NOTE — Telephone Encounter (Signed)
   Please call with the below recommendations.   Amy Clegg 2:29 PM

## 2017-07-03 NOTE — Patient Outreach (Signed)
[facility]) Care Management  07/03/2017  Dillon Andrews 12/05/1940 [id number]   EMMI- Heart Failure RED ON EMMI ALERT Day # 1 Date: 07/02/17 Red Alert Reason:  Scheduled follow up appt? No  Telephone call to patient for EMMI red alert.  Spoke with patient he is able to verify HIPAA.  Patient reports that he is doing good. He reports that he is doing bunches of test for his heart and lungs.  Asked patient if he had time to make a follow up appointment.  Patient states he has not made one with his primary doctor but has one with his heart doctor next week.  He states he will be calling his primary care physician for a follow up appointment.  Patient reports that he did have some weight gain and that his daughter called the heart doctor and his lasix was increased yesterday.  Discussed with patient weight perimeters and signs of heart failure.  He verbalized understanding. Patient denies any other questions of concerns.    Advised patient that he would continue to receive automated phone calls and if answer is questionable a nurse would call. He verbalized understanding.   Plan: RN CM will close case as patient has been assessed and no further interventions needed.  Jone Baseman, RN, MSN [facility] Care Management Care Management Coordinator Direct Line [phone] Toll Free: [phone]  Fax: [phone]

## 2017-07-03 NOTE — Telephone Encounter (Signed)
Copied from [place] [phone]. Topic: Quick Communication - See Telephone Encounter >> Jul 03, 2017  4:12 PM Bea Graff, NT wrote: CRM for notification. See Telephone encounter for: 07/03/17. Pt calling and states that he called his pharmacy to have a refill of glipiZIDE (GLUCOTROL and Dr. Ethelene Hal denied the request. He wants to know if he is suppose to stay on this medication or what Dr. Ethelene Hal would like for him to do.

## 2017-07-04 ENCOUNTER — Telehealth (HOSPITAL_COMMUNITY): Payer: Self-pay | Admitting: *Deleted

## 2017-07-04 ENCOUNTER — Ambulatory Visit (HOSPITAL_COMMUNITY)
Admission: RE | Admit: 2017-07-04 | Discharge: 2017-07-04 | Disposition: A | Discharge status: Home / Self Care | Payer: Medicare HMO | Source: Ambulatory Visit | Attending: Cardiology | Admitting: Cardiology

## 2017-07-04 DIAGNOSIS — I5022 Chronic systolic (congestive) heart failure: Secondary | ICD-10-CM | POA: Insufficient documentation

## 2017-07-04 LAB — BASIC METABOLIC PANEL
ANION GAP: 12 (ref 5–15)
BUN: 42 mg/dL — ABNORMAL HIGH (ref 6–20)
CALCIUM: 9.1 mg/dL (ref 8.9–10.3)
CHLORIDE: 96 mmol/L — AB (ref 101–111)
CO2: 32 mmol/L (ref 22–32)
CREATININE: 1.83 mg/dL — AB (ref 0.61–1.24)
GFR calc non Af Amer: 34 mL/min — ABNORMAL LOW (ref >60)
GFR, EST AFRICAN AMERICAN: 39 mL/min — AB (ref >60)
Glucose, Bld: 191 mg/dL — ABNORMAL HIGH (ref 65–99)
Potassium: 4 mmol/L (ref 3.5–5.1)
SODIUM: 140 mmol/L (ref 135–145)

## 2017-07-04 MED ORDER — GLIPIZIDE 5 MG PO TABS: 2.5000 mg | ORAL_TABLET | Freq: Two times a day (BID) | ORAL | 1 refills | Status: DC

## 2017-07-04 NOTE — Telephone Encounter (Signed)
Patient is checking up on the status of the Glipizide. Please call patient [phone]

## 2017-07-04 NOTE — Telephone Encounter (Signed)
pts labs were stable per [facility]. Patient said he has started to urinate more today and that his weight went down. No changes to medications at this time patient has a follow up appt Wednesday.

## 2017-07-04 NOTE — Telephone Encounter (Signed)
Rx sent in for 6 months worth and patient is aware.

## 2017-07-04 NOTE — Telephone Encounter (Signed)
Patients daughter called and said patient did not lose any weight and had low urine output.  She is concerned about his kidneys. I advised her to have patient come in for lab visit to make sure his Kidney function was ok before we made any other medication changes.   (see note below)           You 18 hours ago (2:37 PM)     Called and spoke with patient he is aware and agreeable with plan.       Documentation      Clegg, Amy D, NP routed conversation to You 18 hours ago (2:29 PM)    Ninfa Meeker, Amy D, NP 18 hours ago (2:29 PM)       Please call with the below recommendations.   Amy Clegg 2:29 PM       Documentation     Clegg, Amy D, NP 18 hours ago (2:27 PM)       Please call and follow regarding shortness of breath.   Instruct him to take 80 mg of lasix twice a day for the next 2 days.    If no improvement instruct them to call HF clinic for additional recommendations.   Amy Clegg NP-C  2:28 PM       Documentation     Raiford Simmonds, RN routed conversation to [facility] Triage Pool 2 days ago    Raiford Simmonds, RN 2 days ago     SPOKE TO DAUGHTER . DAUGHTER IS AT PATIENT 'S home.  patient states he has not been voiding very much since being discharge from the hospital on Sunday. Daily home  Weight  Monday -- 178.6 lb                                   Tuesday   181.2 lb                                 Wednesday  183 lbs Per patient and daughter - shortness of breathe with walking -( nothing new). No noticeable edema in lower extremities or abd.  blood pressure at present 105/79 and 112/80  Pulse 71.    Informed  Daughter to have patient take an extra 40 mg this afternoon. Per protocol. Patient has already taken his daily dose 80 mg this morning and 40 mg at 2 pm.  informed daughter will defer the heart failure clinic for further instructions. patient has an appointment on 07/11/17. Daughter is aware it maybe tomorrow before a response.  Verbalized understanding.

## 2017-07-04 NOTE — Addendum Note (Signed)
Addended by: Jon Billings on: 07/04/2017 04:54 PM   Modules accepted: Orders

## 2017-07-07 ENCOUNTER — Other Ambulatory Visit: Payer: Self-pay

## 2017-07-07 NOTE — Patient Outreach (Signed)
[facility]) Care Management  07/07/2017  Dillon Andrews 1940/12/19 [id number]   EMMI- RED ON EMMI ALERT Day # Date:  Red Alert Reason:   Telephone call to patient to address red alert.  No answer. Unable to leave a message.  Plan: RN CM will attempt patient again within 4 business days.  Jone Baseman, RN, MSN [facility] Care Management Care Management Coordinator Direct Line [phone] Toll Free: [phone]  Fax: [phone]

## 2017-07-08 ENCOUNTER — Encounter (HOSPITAL_COMMUNITY): Payer: Self-pay | Admitting: Internal Medicine

## 2017-07-08 ENCOUNTER — Other Ambulatory Visit: Payer: Self-pay | Admitting: Family Medicine

## 2017-07-08 ENCOUNTER — Other Ambulatory Visit: Payer: Self-pay

## 2017-07-08 ENCOUNTER — Ambulatory Visit (HOSPITAL_COMMUNITY)
Admission: RE | Admit: 2017-07-08 | Discharge: 2017-07-08 | Disposition: A | Discharge status: Home / Self Care | Payer: Medicare HMO | Source: Ambulatory Visit | Attending: Cardiology | Admitting: Cardiology

## 2017-07-08 VITALS — BP 120/70 | HR 70 | Wt 193.5 lb

## 2017-07-08 DIAGNOSIS — Z87891 Personal history of nicotine dependence: Secondary | ICD-10-CM | POA: Insufficient documentation

## 2017-07-08 DIAGNOSIS — N183 Chronic kidney disease, stage 3 (moderate): Secondary | ICD-10-CM | POA: Diagnosis not present

## 2017-07-08 DIAGNOSIS — I130 Hypertensive heart and chronic kidney disease with heart failure and stage 1 through stage 4 chronic kidney disease, or unspecified chronic kidney disease: Secondary | ICD-10-CM | POA: Diagnosis present

## 2017-07-08 DIAGNOSIS — Z961 Presence of intraocular lens: Secondary | ICD-10-CM | POA: Diagnosis present

## 2017-07-08 DIAGNOSIS — J209 Acute bronchitis, unspecified: Secondary | ICD-10-CM | POA: Diagnosis present

## 2017-07-08 DIAGNOSIS — Z9049 Acquired absence of other specified parts of digestive tract: Secondary | ICD-10-CM | POA: Diagnosis not present

## 2017-07-08 DIAGNOSIS — I5023 Acute on chronic systolic (congestive) heart failure: Secondary | ICD-10-CM | POA: Diagnosis not present

## 2017-07-08 DIAGNOSIS — I110 Hypertensive heart disease with heart failure: Secondary | ICD-10-CM | POA: Diagnosis not present

## 2017-07-08 DIAGNOSIS — J9600 Acute respiratory failure, unspecified whether with hypoxia or hypercapnia: Secondary | ICD-10-CM | POA: Diagnosis not present

## 2017-07-08 DIAGNOSIS — G2581 Restless legs syndrome: Secondary | ICD-10-CM | POA: Diagnosis present

## 2017-07-08 DIAGNOSIS — E1122 Type 2 diabetes mellitus with diabetic chronic kidney disease: Secondary | ICD-10-CM

## 2017-07-08 DIAGNOSIS — R0902 Hypoxemia: Secondary | ICD-10-CM | POA: Diagnosis present

## 2017-07-08 DIAGNOSIS — Z79899 Other long term (current) drug therapy: Secondary | ICD-10-CM

## 2017-07-08 DIAGNOSIS — G4700 Insomnia, unspecified: Secondary | ICD-10-CM | POA: Diagnosis present

## 2017-07-08 DIAGNOSIS — I5042 Chronic combined systolic (congestive) and diastolic (congestive) heart failure: Secondary | ICD-10-CM

## 2017-07-08 DIAGNOSIS — E854 Organ-limited amyloidosis: Secondary | ICD-10-CM

## 2017-07-08 DIAGNOSIS — I8510 Secondary esophageal varices without bleeding: Secondary | ICD-10-CM | POA: Diagnosis present

## 2017-07-08 DIAGNOSIS — I4510 Unspecified right bundle-branch block: Secondary | ICD-10-CM | POA: Diagnosis present

## 2017-07-08 DIAGNOSIS — K7460 Unspecified cirrhosis of liver: Secondary | ICD-10-CM | POA: Diagnosis present

## 2017-07-08 DIAGNOSIS — I509 Heart failure, unspecified: Secondary | ICD-10-CM | POA: Diagnosis not present

## 2017-07-08 DIAGNOSIS — I472 Ventricular tachycardia: Secondary | ICD-10-CM | POA: Diagnosis not present

## 2017-07-08 DIAGNOSIS — Z7982 Long term (current) use of aspirin: Secondary | ICD-10-CM | POA: Diagnosis not present

## 2017-07-08 DIAGNOSIS — I43 Cardiomyopathy in diseases classified elsewhere: Secondary | ICD-10-CM

## 2017-07-08 DIAGNOSIS — K760 Fatty (change of) liver, not elsewhere classified: Secondary | ICD-10-CM | POA: Diagnosis present

## 2017-07-08 DIAGNOSIS — R0602 Shortness of breath: Secondary | ICD-10-CM | POA: Diagnosis not present

## 2017-07-08 DIAGNOSIS — K219 Gastro-esophageal reflux disease without esophagitis: Secondary | ICD-10-CM

## 2017-07-08 DIAGNOSIS — E785 Hyperlipidemia, unspecified: Secondary | ICD-10-CM | POA: Diagnosis present

## 2017-07-08 DIAGNOSIS — J9601 Acute respiratory failure with hypoxia: Secondary | ICD-10-CM | POA: Diagnosis not present

## 2017-07-08 DIAGNOSIS — R05 Cough: Secondary | ICD-10-CM | POA: Diagnosis not present

## 2017-07-08 DIAGNOSIS — Z7984 Long term (current) use of oral hypoglycemic drugs: Secondary | ICD-10-CM

## 2017-07-08 DIAGNOSIS — N179 Acute kidney failure, unspecified: Secondary | ICD-10-CM | POA: Diagnosis not present

## 2017-07-08 DIAGNOSIS — J189 Pneumonia, unspecified organism: Secondary | ICD-10-CM | POA: Diagnosis not present

## 2017-07-08 DIAGNOSIS — Z85828 Personal history of other malignant neoplasm of skin: Secondary | ICD-10-CM | POA: Diagnosis not present

## 2017-07-08 DIAGNOSIS — Z96652 Presence of left artificial knee joint: Secondary | ICD-10-CM | POA: Diagnosis present

## 2017-07-08 DIAGNOSIS — J069 Acute upper respiratory infection, unspecified: Secondary | ICD-10-CM | POA: Diagnosis not present

## 2017-07-08 DIAGNOSIS — I5043 Acute on chronic combined systolic (congestive) and diastolic (congestive) heart failure: Secondary | ICD-10-CM | POA: Diagnosis present

## 2017-07-08 DIAGNOSIS — F419 Anxiety disorder, unspecified: Secondary | ICD-10-CM | POA: Diagnosis present

## 2017-07-08 DIAGNOSIS — G4701 Insomnia due to medical condition: Secondary | ICD-10-CM

## 2017-07-08 MED ORDER — TORSEMIDE 20 MG PO TABS: 40.0000 mg | ORAL_TABLET | Freq: Two times a day (BID) | ORAL | 3 refills | Status: DC

## 2017-07-08 NOTE — Progress Notes (Signed)
Advanced Heart Failure Clinic Note   Referring Physician: PCP: [name], MD PCP-Cardiologist: Glenetta Hew, MD   HPI: Dillon Andrews is a 77 y.o. malewith a hx of chronic combined CHF, NICM, tricuspid regurgitation, mitral regurgitation, CKD III,presumedNASH(per pt, never proven),esophageal varices per chart(prior glass of wine occasionally but no heavy alcohol),HTN, HLD, DM, Barrett's esophagus, GERD, anemia, RBBB  He was admitted 3/13-3/17 for A/C combined HF. While inpatient, SPEP and UPEP sent for concerns for cardiac amyloidosis. He diuresed with IV lasix and was transitioned to lasix 80 mg q am, 40 mg q pm. DC weight 180 lbs.   His SPEP came back with m-spike 0.8% and he was referred to Dr Beryle Beams. UPEP with Bence Jones Protein positive; kappa type.   PYP scan completed on 3/20 strongly suggestive of transthyretin amyloidosis.   He presents today for hospital follow up with his daughter. Overall he is doing okay. He does not think that his lasix is working as well any more. He had to take extra lasix last week for weight gain with little response. He reports BLE edema, abdominal distension, and productive cough with clear sputum. Good appetite. Sleeping in a recliner because of orthopnea on a wedge, rare PND, + bendopnea. SOB with minimal activity, like walking short distances. No CP or palpitations. Decreased energy level. One isolated episode of dizziness from lying to standing. Weights at home: increasing 178-187 lbs with up to 4 lb weight gain in one night. Weight up a total 8-1 pounds. Compliant with medications. Eats out about 3x/week. Drinks less than 2L/day.   Review of Systems: [y] = yes, [ ]  = no   General: Weight gain Blue.Reese ]; Weight loss [ ] ; Anorexia [ ] ; Fatigue Blue.Reese ]; Fever [ ] ; Chills [ ] ; Weakness [ ]   Cardiac: Chest pain/pressure [ ] ; Resting SOB [ ] ; Exertional SOB Blue.Reese ]; Orthopnea Blue.Reese ]; Pedal Edema Blue.Reese ]; Palpitations [ ] ; Syncope [ ] ; Presyncope  [ ] ; Paroxysmal nocturnal dyspnea[y ]  Pulmonary: Cough Blue.Reese ]; Wheezing[ ] ; Hemoptysis[ ] ; Sputum [ ] ; Snoring [ ]   GI: Vomiting[ ] ; Dysphagia[ ] ; Melena[ ] ; Hematochezia [ ] ; Heartburn[ ] ; Abdominal pain [ ] ; Constipation [ ] ; Diarrhea [ ] ; BRBPR [ ]   GU: Hematuria[ ] ; Dysuria [ ] ; Nocturia[ ]   Vascular: Pain in legs with walking [ ] ; Pain in feet with lying flat [ ] ; Non-healing sores [ ] ; Stroke [ ] ; TIA [ ] ; Slurred speech [ ] ;  Neuro: Headaches[ ] ; Vertigo[ ] ; Seizures[ ] ; Paresthesias[ ] ;Blurred vision [ ] ; Diplopia [ ] ; Vision changes [ ]   Ortho/Skin: Arthritis Blue.Reese ]; Joint pain [ y]; Muscle pain [ ] ; Joint swelling [ ] ; Back Pain [ ] ; Rash [ ]   Psych: Depression[ ] ; Anxiety[ ]   Heme: Bleeding problems [ ] ; Clotting disorders [ ] ; Anemia [ ]   Endocrine: Diabetes Blue.Reese ]; Thyroid dysfunction[ ]    Past Medical History:  Diagnosis Date  . Anemia 11/2016  . Arthritis    "some; hands/fingers" (06/25/2017)  . Atrial tachycardia ([place])   . Barrett's esophagus   . Basal cell carcinoma    "face, arms, hands" (06/25/2017)  . Chicken pox   . Cholelithiasis 11/2016   S/P LAP CHOLE  . Chronic combined systolic and diastolic CHF (congestive heart failure) ([place])   . CKD (chronic kidney disease), stage III ([place])   . Esophageal varices (H[facility])    Secondary to NAFLD  . Family history of adverse reaction to anesthesia    "daughter's  sat drop very low; last OR had to be reintubated" (06/25/2017)  . GERD (gastroesophageal reflux disease)   . High cholesterol   . Hypertension   . Mitral regurgitation   . NAFLD (nonalcoholic fatty liver disease)   . NICM (nonischemic cardiomyopathy) ([place])   . NSVT (nonsustained ventricular tachycardia) ([place])   . Pneumonia 09/2016  . RBBB   . Tricuspid regurgitation   . Type II diabetes mellitus ([place])     Current Outpatient Medications  Medication Sig Dispense Refill  . aspirin EC 81 MG tablet Take 81 mg by mouth daily.    . carvedilol (COREG) 6.25 MG tablet Take 1  tablet (6.25 mg total) by mouth 2 (two) times daily with a meal. 180 tablet 0  . furosemide (LASIX) 40 MG tablet Take 28m in the morning and 455min the evening    . gabapentin (NEURONTIN) 800 MG tablet Take 1 tablet (800 mg total) by mouth at bedtime. 90 tablet 1  . glipiZIDE (GLUCOTROL) 5 MG tablet Take 0.5 tablets (2.5 mg total) by mouth 2 (two) times daily. 90 tablet 1  . glucose blood test strip Use as instructed 100 each 12  . mirtazapine (REMERON) 7.5 MG tablet TAKE 1 TABLET BY MOUTH AT BEDTIME 30 tablet 2  . omeprazole (PRILOSEC) 40 MG capsule Take 40 mg by mouth daily.    . rosuvastatin (CRESTOR) 10 MG tablet Take 10 mg by mouth daily.    . Marland Kitchenpironolactone (ALDACTONE) 25 MG tablet Take 0.5 tablets (12.5 mg total) by mouth daily. 15 tablet 0  . vitamin B-12 (CYANOCOBALAMIN) 500 MCG tablet Take 500 mcg by mouth every other day.     No current facility-administered medications for this encounter.     Allergies  Allergen Reactions  . Spironolactone     After initiation in 04/2017, had hyperkalemia on follow-up labwork so FYI      Social History   Socioeconomic History  . Marital status: Married    Spouse name: Not on file  . Number of children: Not on file  . Years of education: Not on file  . Highest education level: Not on file  Occupational History  . Not on file  Social Needs  . Financial resource strain: Not on file  . Food insecurity:    Worry: Not on file    Inability: Not on file  . Transportation needs:    Medical: Not on file    Non-medical: Not on file  Tobacco Use  . Smoking status: Former Smoker    Packs/day: 0.30    Years: 15.00    Pack years: 4.50    Types: Cigarettes    Last attempt to quit: 03/17/1977    Years since quitting: 40.3  . Smokeless tobacco: Never Used  Substance and Sexual Activity  . Alcohol use: Yes    Comment: 06/25/2017 "nothing since 09/2016;  used to have glass of wine q hs"  . Drug use: No  . Sexual activity: Not on file    Lifestyle  . Physical activity:    Days per week: Not on file    Minutes per session: Not on file  . Stress: Not on file  Relationships  . Social connections:    Talks on phone: Not on file    Gets together: Not on file    Attends religious service: Not on file    Active member of club or organization: Not on file    Attends meetings of clubs or organizations: Not on  file    Relationship status: Not on file  . Intimate partner violence:    Fear of current or ex partner: Not on file    Emotionally abused: Not on file    Physically abused: Not on file    Forced sexual activity: Not on file  Other Topics Concern  . Not on file  Social History Narrative   Accompanied by his daughter --> also a patient of Dr. Ellyn Hack      Family History  Problem Relation Age of Onset  . Cancer Father   . Cataracts Father   . Breast cancer Sister   . Cirrhosis Neg Hx     Vitals:   07/08/17 1127  BP: 120/70  Pulse: 70  SpO2: 100%  Weight: 193 lb 8 oz (87.8 kg)   Filed Weights   07/08/17 1127  Weight: 193 lb 8 oz (87.8 kg)    PHYSICAL EXAM: General:  Elderly. No respiratory difficulty HEENT: normal Neck: supple. JVP 9. Carotids 2+ bilat; no bruits. No lymphadenopathy or thyromegaly appreciated. Cor: PMI nondisplaced. Regular rate & rhythm. No rubs, gallops or murmurs. Lungs: dull in RLL otherwise clear Abdomen: soft, nontender, + distended. No hepatosplenomegaly. No bruits or masses. Good bowel sounds. Extremities: no cyanosis, clubbing, rash, 1-2+edema BLE Neuro: alert & oriented x 3, cranial nerves grossly intact. moves all 4 extremities w/o difficulty. Affect pleasant.   ASSESSMENT & PLAN:  1. Chronic combined HF. NICM by cath at [facility] 09/2016 with EF 40-45%. Echo 06/2017: EF 25-30%, mild LAE, mild/mod RV dilation, mod/severe RA. PYP scan strongly suggestive of TTR amyloid, SPEP with 0.8% m-spike, UPEP with Bence Jones Protein positive, kappa type - NYHA class III, volume status  elevated. Up 13 lbs from discharge weight.  - Stop lasix. Start torsemide 40 mg BID. BMET Friday.  - He was instructed to call on Thursday if he has not lost 5 lbs. May need to use metolazone.  - Continue coreg 6.25 mg BID. - Continue spiro 12.5 mg daily - No ARB/entresto with CKD - Genetic testing today - Refer to Dr Posey Pronto with neurology  2. CKD III, baseline ~1.6 - Creatinine 1.83 on 3/22 - Check BMET Friday with medication changes  3. Hx of moderate mitral regurgitation on [facility] TEE 09/2016 - No mention of MR on more recent Echo.   4. HTN - Well controlled with HF meds  5. HL - Continue crestor - LDL 92 11/2016 (care everywhere)  6. Recurrent bilateral effusions - Did not require thoracentesis while inpatient. On exam still seems to have small RLL effusion.  Stop lasix. Start torsemide 40 mg BID. Check BMET Friday Call clinic if he has not lost 5 lbs by Thursday Genetic testing today Refer to Dr. Posey Pronto with neurology Follow up in 1 month  Georgiana Shore, NP 07/08/17   Patient seen and examined with the above-signed Advanced Practice Provider and/or Housestaff. I personally reviewed laboratory data, imaging studies and relevant notes. I independently examined the patient and formulated the important aspects of the plan. I have edited the note to reflect any of my changes or salient points. I have personally discussed the plan with the patient and/or family.  W/u suggests TTR amyloid. EF decreasing. Will check genetic testing. If genetic will consider patiseran therapy o/w will need to wait for tafamadis Diflusenil not a great option with CKD. Will refer to Neurology. Volume overloaded on exam. Switch furosemide to torsemide. He will call on Thursday if no response. Discussed seriousness of  amyloid condition with him and his daughter in detail.   Total time spent personally 40 minutes. Over half that time spent discussing above.   Glori Bickers, MD  1:55 PM

## 2017-07-08 NOTE — Patient Instructions (Signed)
Stop Furosemide  Start Torsemide 40 mg (2 tabs) Twice daily   **If you do not lose 5 lbs by Thursday 3/28 AM please call our office Thursday AM [phone] opt 5 for triage nurse**  Lab on Friday  Your Genetic Testing is being sent out, the results can take a week or 2 to come in.  You have been referred to Dr Posey Pronto, [facility], her office will call you to schedule  Your physician recommends that you schedule a follow-up appointment in: 1 month with Dr [name]

## 2017-07-09 ENCOUNTER — Other Ambulatory Visit: Payer: Self-pay

## 2017-07-09 ENCOUNTER — Inpatient Hospital Stay (HOSPITAL_COMMUNITY): Payer: Medicare HMO | Admitting: Cardiology

## 2017-07-09 ENCOUNTER — Encounter: Payer: Self-pay | Admitting: Neurology

## 2017-07-09 NOTE — Addendum Note (Signed)
Encounter addended by: Scarlette Calico, RN on: 07/09/2017 2:15 PM  Actions taken: Sign clinical note

## 2017-07-09 NOTE — Progress Notes (Signed)
Late Entry:  Pt's saliva was collected for TTR Genetic Testing and sent via [facility] to [facility] test center.  Referral for Neurology was sent to Dr Posey Pronto at [facility].

## 2017-07-09 NOTE — Patient Outreach (Signed)
[facility]) Care Andrews  07/09/2017  Dillon Andrews June 19, 1940 [id number]   EMMI- Heart Failure RED ON EMMI ALERT Day # 5 Date: 07/05/17 Red Alert Reason:  Weight 187 lbs   RED ON EMMI ALERT Day # 7 Date: 07/07/17  Red Alert Reason: New worsening problems? Yes New swelling? yes   RED ON EMMI ALERT Day # 8 Date: 07/08/17 Red Alert Reason: Dillon Andrews to follow up appointment? No Why they did not attend follow up? I missed it   Telephone call to patient for follow up EMMI alert.  Spoke with patient.  He is able to verify HIPAA.  Addressed red alerts with patient.  He states he has had some problems with weight gain and swelling. He went to the heart failure clinic on yesterday and saw Dr. Haroldine Andrews.  He was changed to torsemide and started taking that on yesterday.  Patient reports he lost 1.4 lbs since yesterday.  Patient reports he is excited about that and goes for labs on Friday per patient.   Asked patient had he missed any appointments. He states he did not but he had a change from Dr. Kirk Andrews to Dr. Haroldine Andrews.    Advised patient he would continue to receive automated calls and if any questionable answers a nurse would be calling.  Discussed with patient heart failure and education and how Dillon Andrews could be involved.  Patient states he feels that Dr. Haroldine Andrews is thorough and that he feels he does not need additional education at this time.    Plan: RN CM will close case at this time.  Dillon Baseman, RN, MSN [facility] Care Andrews Care Andrews Coordinator Direct Line [phone] Toll Free: [phone]  Fax: [phone]

## 2017-07-10 ENCOUNTER — Telehealth (HOSPITAL_COMMUNITY): Payer: Self-pay | Admitting: Cardiology

## 2017-07-10 ENCOUNTER — Other Ambulatory Visit: Payer: Self-pay

## 2017-07-10 ENCOUNTER — Inpatient Hospital Stay (HOSPITAL_COMMUNITY)
Admission: EM | Admit: 2017-07-10 | Discharge: 2017-07-13 | DRG: 291 | Disposition: A | Discharge status: Home / Self Care | Payer: Medicare HMO | Attending: Internal Medicine | Admitting: Internal Medicine

## 2017-07-10 ENCOUNTER — Ambulatory Visit: Payer: Self-pay

## 2017-07-10 ENCOUNTER — Emergency Department (HOSPITAL_COMMUNITY): Payer: Medicare HMO

## 2017-07-10 ENCOUNTER — Encounter (HOSPITAL_COMMUNITY): Payer: Self-pay | Admitting: Emergency Medicine

## 2017-07-10 DIAGNOSIS — I43 Cardiomyopathy in diseases classified elsewhere: Secondary | ICD-10-CM | POA: Diagnosis present

## 2017-07-10 DIAGNOSIS — J189 Pneumonia, unspecified organism: Secondary | ICD-10-CM | POA: Diagnosis present

## 2017-07-10 DIAGNOSIS — F419 Anxiety disorder, unspecified: Secondary | ICD-10-CM | POA: Diagnosis present

## 2017-07-10 DIAGNOSIS — G2581 Restless legs syndrome: Secondary | ICD-10-CM | POA: Diagnosis present

## 2017-07-10 DIAGNOSIS — K219 Gastro-esophageal reflux disease without esophagitis: Secondary | ICD-10-CM | POA: Diagnosis present

## 2017-07-10 DIAGNOSIS — Z9049 Acquired absence of other specified parts of digestive tract: Secondary | ICD-10-CM | POA: Diagnosis not present

## 2017-07-10 DIAGNOSIS — I509 Heart failure, unspecified: Secondary | ICD-10-CM

## 2017-07-10 DIAGNOSIS — Z7982 Long term (current) use of aspirin: Secondary | ICD-10-CM | POA: Diagnosis not present

## 2017-07-10 DIAGNOSIS — Z888 Allergy status to other drugs, medicaments and biological substances status: Secondary | ICD-10-CM

## 2017-07-10 DIAGNOSIS — J209 Acute bronchitis, unspecified: Secondary | ICD-10-CM | POA: Diagnosis present

## 2017-07-10 DIAGNOSIS — E119 Type 2 diabetes mellitus without complications: Secondary | ICD-10-CM

## 2017-07-10 DIAGNOSIS — E854 Organ-limited amyloidosis: Secondary | ICD-10-CM

## 2017-07-10 DIAGNOSIS — N183 Chronic kidney disease, stage 3 unspecified: Secondary | ICD-10-CM | POA: Diagnosis present

## 2017-07-10 DIAGNOSIS — E785 Hyperlipidemia, unspecified: Secondary | ICD-10-CM | POA: Diagnosis present

## 2017-07-10 DIAGNOSIS — K7460 Unspecified cirrhosis of liver: Secondary | ICD-10-CM | POA: Diagnosis present

## 2017-07-10 DIAGNOSIS — I8510 Secondary esophageal varices without bleeding: Secondary | ICD-10-CM | POA: Diagnosis present

## 2017-07-10 DIAGNOSIS — E1122 Type 2 diabetes mellitus with diabetic chronic kidney disease: Secondary | ICD-10-CM

## 2017-07-10 DIAGNOSIS — Z7984 Long term (current) use of oral hypoglycemic drugs: Secondary | ICD-10-CM

## 2017-07-10 DIAGNOSIS — Z79899 Other long term (current) drug therapy: Secondary | ICD-10-CM

## 2017-07-10 DIAGNOSIS — I5043 Acute on chronic combined systolic (congestive) and diastolic (congestive) heart failure: Secondary | ICD-10-CM | POA: Diagnosis present

## 2017-07-10 DIAGNOSIS — J9601 Acute respiratory failure with hypoxia: Secondary | ICD-10-CM | POA: Diagnosis present

## 2017-07-10 DIAGNOSIS — I340 Nonrheumatic mitral (valve) insufficiency: Secondary | ICD-10-CM | POA: Diagnosis present

## 2017-07-10 DIAGNOSIS — J9600 Acute respiratory failure, unspecified whether with hypoxia or hypercapnia: Secondary | ICD-10-CM

## 2017-07-10 DIAGNOSIS — I472 Ventricular tachycardia: Secondary | ICD-10-CM | POA: Diagnosis not present

## 2017-07-10 DIAGNOSIS — J90 Pleural effusion, not elsewhere classified: Secondary | ICD-10-CM | POA: Diagnosis present

## 2017-07-10 DIAGNOSIS — Z961 Presence of intraocular lens: Secondary | ICD-10-CM | POA: Diagnosis present

## 2017-07-10 DIAGNOSIS — I428 Other cardiomyopathies: Secondary | ICD-10-CM

## 2017-07-10 DIAGNOSIS — N179 Acute kidney failure, unspecified: Secondary | ICD-10-CM | POA: Diagnosis not present

## 2017-07-10 DIAGNOSIS — Z85828 Personal history of other malignant neoplasm of skin: Secondary | ICD-10-CM | POA: Diagnosis not present

## 2017-07-10 DIAGNOSIS — Z96652 Presence of left artificial knee joint: Secondary | ICD-10-CM | POA: Diagnosis present

## 2017-07-10 DIAGNOSIS — I130 Hypertensive heart and chronic kidney disease with heart failure and stage 1 through stage 4 chronic kidney disease, or unspecified chronic kidney disease: Secondary | ICD-10-CM | POA: Diagnosis present

## 2017-07-10 DIAGNOSIS — G4700 Insomnia, unspecified: Secondary | ICD-10-CM | POA: Diagnosis present

## 2017-07-10 DIAGNOSIS — I5023 Acute on chronic systolic (congestive) heart failure: Secondary | ICD-10-CM | POA: Diagnosis not present

## 2017-07-10 DIAGNOSIS — I4510 Unspecified right bundle-branch block: Secondary | ICD-10-CM | POA: Diagnosis present

## 2017-07-10 DIAGNOSIS — K760 Fatty (change of) liver, not elsewhere classified: Secondary | ICD-10-CM | POA: Diagnosis present

## 2017-07-10 DIAGNOSIS — I10 Essential (primary) hypertension: Secondary | ICD-10-CM | POA: Diagnosis present

## 2017-07-10 DIAGNOSIS — R0902 Hypoxemia: Secondary | ICD-10-CM

## 2017-07-10 DIAGNOSIS — Z87891 Personal history of nicotine dependence: Secondary | ICD-10-CM

## 2017-07-10 DIAGNOSIS — J069 Acute upper respiratory infection, unspecified: Secondary | ICD-10-CM | POA: Diagnosis not present

## 2017-07-10 LAB — COMPREHENSIVE METABOLIC PANEL
ALBUMIN: 3.7 g/dL (ref 3.5–5.0)
ALT: 30 U/L (ref 17–63)
AST: 40 U/L (ref 15–41)
Alkaline Phosphatase: 244 U/L — ABNORMAL HIGH (ref 38–126)
Anion gap: 15 (ref 5–15)
BILIRUBIN TOTAL: 1.6 mg/dL — AB (ref 0.3–1.2)
BUN: 33 mg/dL — AB (ref 6–20)
CHLORIDE: 94 mmol/L — AB (ref 101–111)
CO2: 31 mmol/L (ref 22–32)
Calcium: 9.1 mg/dL (ref 8.9–10.3)
Creatinine, Ser: 1.98 mg/dL — ABNORMAL HIGH (ref 0.61–1.24)
GFR calc Af Amer: 36 mL/min — ABNORMAL LOW (ref >60)
GFR calc non Af Amer: 31 mL/min — ABNORMAL LOW (ref >60)
GLUCOSE: 68 mg/dL (ref 65–99)
POTASSIUM: 3.5 mmol/L (ref 3.5–5.1)
SODIUM: 140 mmol/L (ref 135–145)
Total Protein: 7.6 g/dL (ref 6.5–8.1)

## 2017-07-10 LAB — EXPECTORATED SPUTUM ASSESSMENT W GRAM STAIN, RFLX TO RESP C

## 2017-07-10 LAB — URINALYSIS, ROUTINE W REFLEX MICROSCOPIC
Bilirubin Urine: NEGATIVE
GLUCOSE, UA: NEGATIVE mg/dL
HGB URINE DIPSTICK: NEGATIVE
Ketones, ur: NEGATIVE mg/dL
LEUKOCYTES UA: NEGATIVE
Nitrite: NEGATIVE
Protein, ur: NEGATIVE mg/dL
SPECIFIC GRAVITY, URINE: 1.006 (ref 1.005–1.030)
pH: 7 (ref 5.0–8.0)

## 2017-07-10 LAB — CBC WITH DIFFERENTIAL/PLATELET
Basophils Absolute: 0 10*3/uL (ref 0.0–0.1)
Basophils Relative: 0 %
Eosinophils Absolute: 0.1 10*3/uL (ref 0.0–0.7)
Eosinophils Relative: 1 %
HEMATOCRIT: 39.9 % (ref 39.0–52.0)
Hemoglobin: 12.3 g/dL — ABNORMAL LOW (ref 13.0–17.0)
LYMPHS ABS: 0.9 10*3/uL (ref 0.7–4.0)
Lymphocytes Relative: 11 %
MCH: 26.9 pg (ref 26.0–34.0)
MCHC: 30.8 g/dL (ref 30.0–36.0)
MCV: 87.3 fL (ref 78.0–100.0)
MONO ABS: 1.4 10*3/uL — AB (ref 0.1–1.0)
MONOS PCT: 17 %
NEUTROS ABS: 6.2 10*3/uL (ref 1.7–7.7)
Neutrophils Relative %: 71 %
Platelets: 200 10*3/uL (ref 150–400)
RBC: 4.57 MIL/uL (ref 4.22–5.81)
RDW: 20.4 % — AB (ref 11.5–15.5)
WBC: 8.8 10*3/uL (ref 4.0–10.5)

## 2017-07-10 LAB — STREP PNEUMONIAE URINARY ANTIGEN: Strep Pneumo Urinary Antigen: NEGATIVE

## 2017-07-10 LAB — GLUCOSE, CAPILLARY
GLUCOSE-CAPILLARY: 146 mg/dL — AB (ref 65–99)
Glucose-Capillary: 159 mg/dL — ABNORMAL HIGH (ref 65–99)

## 2017-07-10 LAB — EXPECTORATED SPUTUM ASSESSMENT W REFEX TO RESP CULTURE

## 2017-07-10 LAB — PROCALCITONIN: Procalcitonin: <0.1 ng/mL

## 2017-07-10 LAB — INFLUENZA PANEL BY PCR (TYPE A & B)
INFLAPCR: NEGATIVE
Influenza B By PCR: NEGATIVE

## 2017-07-10 LAB — PROTIME-INR
INR: 1.28
Prothrombin Time: 15.9 seconds — ABNORMAL HIGH (ref 11.4–15.2)

## 2017-07-10 LAB — HEMOGLOBIN A1C
Hgb A1c MFr Bld: 7 % — ABNORMAL HIGH (ref 4.8–5.6)
MEAN PLASMA GLUCOSE: 154.2 mg/dL

## 2017-07-10 LAB — I-STAT CG4 LACTIC ACID, ED: Lactic Acid, Venous: 1.79 mmol/L (ref 0.5–1.9)

## 2017-07-10 MED ORDER — FUROSEMIDE 10 MG/ML IJ SOLN
60.0000 mg | Freq: Once | INTRAMUSCULAR | Status: AC
  Administered 2017-07-10: 60 mg via INTRAVENOUS
  Filled 2017-07-10: qty 6

## 2017-07-10 MED ORDER — SODIUM CHLORIDE 0.9% FLUSH
3.0000 mL | INTRAVENOUS | Status: DC | PRN
  Administered 2017-07-11: 3 mL via INTRAVENOUS
  Filled 2017-07-10: qty 3

## 2017-07-10 MED ORDER — PANTOPRAZOLE SODIUM 40 MG PO TBEC
40.0000 mg | DELAYED_RELEASE_TABLET | Freq: Every day | ORAL | Status: DC
  Administered 2017-07-11 – 2017-07-13 (×3): 40 mg via ORAL
  Filled 2017-07-10 (×3): qty 1

## 2017-07-10 MED ORDER — ONDANSETRON HCL 4 MG PO TABS: 4.0000 mg | ORAL_TABLET | Freq: Four times a day (QID) | ORAL | Status: DC | PRN

## 2017-07-10 MED ORDER — MIRTAZAPINE 15 MG PO TABS
7.5000 mg | ORAL_TABLET | Freq: Every day | ORAL | Status: DC
  Administered 2017-07-10: 7.5 mg via ORAL
  Filled 2017-07-10 (×2): qty 1

## 2017-07-10 MED ORDER — INSULIN ASPART 100 UNIT/ML ~~LOC~~ SOLN
0.0000 [IU] | Freq: Three times a day (TID) | SUBCUTANEOUS | Status: DC
Start: 2017-07-10 — End: 2017-07-13
  Administered 2017-07-11 – 2017-07-12 (×5): 1 [IU] via SUBCUTANEOUS
  Administered 2017-07-13: 2 [IU] via SUBCUTANEOUS

## 2017-07-10 MED ORDER — VANCOMYCIN HCL 10 G IV SOLR
1500.0000 mg | Freq: Once | INTRAVENOUS | Status: AC
  Administered 2017-07-10: 1500 mg via INTRAVENOUS
  Filled 2017-07-10 (×2): qty 1500

## 2017-07-10 MED ORDER — INSULIN ASPART 100 UNIT/ML ~~LOC~~ SOLN
0.0000 [IU] | Freq: Every day | SUBCUTANEOUS | Status: DC
Start: 2017-07-10 — End: 2017-07-13

## 2017-07-10 MED ORDER — SENNOSIDES-DOCUSATE SODIUM 8.6-50 MG PO TABS: 1.0000 | ORAL_TABLET | Freq: Every evening | ORAL | Status: DC | PRN

## 2017-07-10 MED ORDER — METOLAZONE 2.5 MG PO TABS: 2.5000 mg | ORAL_TABLET | Freq: Every day | ORAL | 0 refills | Status: DC

## 2017-07-10 MED ORDER — ACETAMINOPHEN 325 MG PO TABS
650.0000 mg | ORAL_TABLET | Freq: Four times a day (QID) | ORAL | Status: DC | PRN
  Administered 2017-07-10: 650 mg via ORAL
  Filled 2017-07-10: qty 2

## 2017-07-10 MED ORDER — SODIUM CHLORIDE 0.9 % IV SOLN
1.0000 g | INTRAVENOUS | Status: DC
  Administered 2017-07-11 – 2017-07-12 (×2): 1 g via INTRAVENOUS
  Filled 2017-07-10 (×2): qty 1

## 2017-07-10 MED ORDER — GABAPENTIN 400 MG PO CAPS
800.0000 mg | ORAL_CAPSULE | Freq: Every day | ORAL | Status: DC
  Administered 2017-07-10 – 2017-07-12 (×3): 800 mg via ORAL
  Filled 2017-07-10 (×3): qty 2

## 2017-07-10 MED ORDER — SODIUM CHLORIDE 0.9 % IV SOLN: 250.0000 mL | INTRAVENOUS | Status: DC | PRN

## 2017-07-10 MED ORDER — SODIUM CHLORIDE 0.9% FLUSH
3.0000 mL | Freq: Two times a day (BID) | INTRAVENOUS | Status: DC
  Administered 2017-07-10 – 2017-07-13 (×6): 3 mL via INTRAVENOUS

## 2017-07-10 MED ORDER — ONDANSETRON HCL 4 MG/2ML IJ SOLN: 4.0000 mg | Freq: Four times a day (QID) | INTRAMUSCULAR | Status: DC | PRN

## 2017-07-10 MED ORDER — ROSUVASTATIN CALCIUM 10 MG PO TABS
10.0000 mg | ORAL_TABLET | Freq: Every day | ORAL | Status: DC
  Administered 2017-07-11 – 2017-07-12 (×2): 10 mg via ORAL
  Filled 2017-07-10 (×4): qty 1

## 2017-07-10 MED ORDER — SPIRONOLACTONE 12.5 MG HALF TABLET: 12.5000 mg | ORAL_TABLET | Freq: Every day | ORAL | Status: DC

## 2017-07-10 MED ORDER — VANCOMYCIN HCL 10 G IV SOLR
1250.0000 mg | INTRAVENOUS | Status: DC
  Administered 2017-07-11: 1250 mg via INTRAVENOUS
  Filled 2017-07-10: qty 1250

## 2017-07-10 MED ORDER — TORSEMIDE 20 MG PO TABS: 40.0000 mg | ORAL_TABLET | Freq: Two times a day (BID) | ORAL | Status: DC

## 2017-07-10 MED ORDER — ALBUTEROL SULFATE (2.5 MG/3ML) 0.083% IN NEBU
2.5000 mg | INHALATION_SOLUTION | Freq: Four times a day (QID) | RESPIRATORY_TRACT | Status: DC
  Administered 2017-07-10 – 2017-07-11 (×4): 2.5 mg via RESPIRATORY_TRACT
  Filled 2017-07-10 (×4): qty 3

## 2017-07-10 MED ORDER — POTASSIUM CHLORIDE CRYS ER 20 MEQ PO TBCR
20.0000 meq | EXTENDED_RELEASE_TABLET | Freq: Every day | ORAL | Status: AC
  Administered 2017-07-10 – 2017-07-11 (×2): 20 meq via ORAL
  Filled 2017-07-10 (×2): qty 1

## 2017-07-10 MED ORDER — POTASSIUM CHLORIDE CRYS ER 20 MEQ PO TBCR: 20.0000 meq | EXTENDED_RELEASE_TABLET | ORAL | 0 refills | Status: DC

## 2017-07-10 MED ORDER — SODIUM CHLORIDE 0.9 % IV SOLN
1.0000 g | Freq: Once | INTRAVENOUS | Status: AC
  Administered 2017-07-10: 1 g via INTRAVENOUS
  Filled 2017-07-10 (×2): qty 1

## 2017-07-10 MED ORDER — CARVEDILOL 6.25 MG PO TABS
6.2500 mg | ORAL_TABLET | Freq: Two times a day (BID) | ORAL | Status: DC
  Administered 2017-07-10 – 2017-07-13 (×6): 6.25 mg via ORAL
  Filled 2017-07-10 (×6): qty 1

## 2017-07-10 MED ORDER — CYANOCOBALAMIN 500 MCG PO TABS
500.0000 ug | ORAL_TABLET | ORAL | Status: DC
  Administered 2017-07-12: 500 ug via ORAL
  Filled 2017-07-10: qty 1

## 2017-07-10 MED ORDER — POTASSIUM CHLORIDE CRYS ER 20 MEQ PO TBCR: 20.0000 meq | EXTENDED_RELEASE_TABLET | ORAL | Status: DC

## 2017-07-10 MED ORDER — KETOROLAC TROMETHAMINE 15 MG/ML IJ SOLN: 15.0000 mg | Freq: Four times a day (QID) | INTRAMUSCULAR | Status: DC | PRN

## 2017-07-10 MED ORDER — METOLAZONE 2.5 MG PO TABS
2.5000 mg | ORAL_TABLET | Freq: Every day | ORAL | Status: DC
  Filled 2017-07-10: qty 1

## 2017-07-10 MED ORDER — ZOLPIDEM TARTRATE 5 MG PO TABS
5.0000 mg | ORAL_TABLET | Freq: Every evening | ORAL | Status: DC | PRN
  Administered 2017-07-10 – 2017-07-12 (×2): 5 mg via ORAL
  Filled 2017-07-10 (×2): qty 1

## 2017-07-10 MED ORDER — ASPIRIN EC 81 MG PO TBEC
81.0000 mg | DELAYED_RELEASE_TABLET | Freq: Every day | ORAL | Status: DC
  Administered 2017-07-11 – 2017-07-13 (×3): 81 mg via ORAL
  Filled 2017-07-10 (×3): qty 1

## 2017-07-10 MED ORDER — ENOXAPARIN SODIUM 40 MG/0.4ML ~~LOC~~ SOLN
40.0000 mg | SUBCUTANEOUS | Status: DC
Start: 2017-07-10 — End: 2017-07-10

## 2017-07-10 MED ORDER — METOLAZONE 2.5 MG PO TABS: 2.5000 mg | ORAL_TABLET | Freq: Every day | ORAL | Status: DC

## 2017-07-10 MED ORDER — HEPARIN SODIUM (PORCINE) 5000 UNIT/ML IJ SOLN
5000.0000 [IU] | Freq: Three times a day (TID) | INTRAMUSCULAR | Status: DC
  Administered 2017-07-10 – 2017-07-13 (×8): 5000 [IU] via SUBCUTANEOUS
  Filled 2017-07-10 (×8): qty 1

## 2017-07-10 NOTE — Telephone Encounter (Signed)
Patients wife called to give weight update. Reports weight as follows:  3/26 188.4 3/27 187 3/28 185   Patient does not feel well. Cough with mucus

## 2017-07-10 NOTE — Consult Note (Signed)
Advanced Heart Failure Team Consult Note   Primary Physician: [name], MD PCP-Cardiologist:  Glenetta Hew, MD  Reason for Consultation: Acute respiratory failure  HPI:    Dillon Andrews is seen today for evaluation of acute respiratory failure at the request of Dr. Lorin Mercy.   Dillon Andrews is a 77 y.o. male with a hx of chronic combined CHF, NICM, tricuspid regurgitation, mitral regurgitation, CKD III,presumedNASH(per pt, never proven),esophageal varices per chart(prior glass of wine occasionally but no heavy alcohol),HTN, HLD, DM, Barrett's esophagus, GERD, anemia, RBBB, and suspected cardiac amyloidosis.  Recently admitted 3/13-3/17/2019 for A/C combined HF. While inpatient, SPEP and UPEP sent for concerns for cardiac amyloidosis. He diuresed with IV lasix and was transitioned to lasix 80 mg q am, 40 mg q pm. DC weight 180 lbs. His SPEP came back with m-spike 0.8% and he was referred to Dr Beryle Beams. UPEP with Bence Jones Protein positive; kappa type. PYP scan completed on 3/20 strongly suggestive of transthyretin amyloidosis.   Seen in CHF clinic 07/08/17 for follow up. Noted to have a weight gain of 8 lbs since discharge with BLE edema, abdominal distention, and productive cough. Stopped lasix and switched to torsemide 40 mg BID.  Pt initially felt OK after visit, and lost 3 lbs with switch to torsemide.  Am of 07/10/17 he developed fever and worsening SOB, so presented to ED. He has thick, green sputum. Also complains of fatigue and weakness. No CP, palpitations, HA, dizziness, or near-syncope. He has no urinary symptoms.  Pertinent labs on admission include K 3.5, Creatinine 1.98, Hgb 12.3, and WBC 8.8. CXR with bibasilar atelectasis and small pleural effusions, with R slightly worse than previous. PCT < 0.10  On my exam, Patient feeling slightly better with O2 via [place].  He still feels feverish, and feels like he still has extra fluid on board. LLE more  swollen than RLE chronically. He denies syncope or lightheadedness. Continues to cough up thick, green sputum  Echo 06/2017: EF 25-30%, mild LAE, mild/mod RV dilation, mod/severe RA Personally reviewed   Review of Systems: [y] = yes, [ ]  = no   General: Weight gain [y]; Weight loss [ ] ; Anorexia [ ] ; Fatigue [y]; Fever [y]; Chills [y]; Weakness [y]  Cardiac: Chest pain/pressure [ ] ; Resting SOB [ ] ; Exertional SOB [y]; Orthopnea [ ] ; Pedal Edema [y]; Palpitations [ ] ; Syncope [ ] ; Presyncope [ ] ; Paroxysmal nocturnal dyspnea[ ]   Pulmonary: Cough [y]; Wheezing[ ] ; Hemoptysis[ ] ; Sputum [y]; Snoring [ ]   GI: Vomiting[ ] ; Dysphagia[ ] ; Melena[ ] ; Hematochezia [ ] ; Heartburn[ ] ; Abdominal pain [ ] ; Constipation [ ] ; Diarrhea [ ] ; BRBPR [ ]   GU: Hematuria[ ] ; Dysuria [ ] ; Nocturia[ ]   Vascular: Pain in legs with walking [ ] ; Pain in feet with lying flat [ ] ; Non-healing sores [ ] ; Stroke [ ] ; TIA [ ] ; Slurred speech [ ] ;  Neuro: Headaches[ ] ; Vertigo[ ] ; Seizures[ ] ; Paresthesias[ ] ;Blurred vision [ ] ; Diplopia [ ] ; Vision changes [ ]   Ortho/Skin: Arthritis [y]; Joint pain [y]; Muscle pain [ ] ; Joint swelling [ ] ; Back Pain [ ] ; Rash [ ]   Psych: Depression[ ] ; Anxiety[ ]   Heme: Bleeding problems [ ] ; Clotting disorders [ ] ; Anemia [ ]   Endocrine: Diabetes [ ] ; Thyroid dysfunction[ ]   Home Medications Prior to Admission medications   Medication Sig Start Date End Date Taking? Authorizing Provider  aspirin EC 81 MG tablet Take 81 mg by mouth daily. 12/23/14  Yes [provider]  carvedilol (COREG) 6.25 MG tablet Take 1 tablet (6.25 mg total) by mouth 2 (two) times daily with a meal. 04/29/17  Yes Leonie Man, MD  gabapentin (NEURONTIN) 800 MG tablet Take 1 tablet (800 mg total) by mouth at bedtime. 03/05/17  Yes [name], MD  glipiZIDE (GLUCOTROL) 5 MG tablet Take 0.5 tablets (2.5 mg total) by mouth 2 (two) times daily. 07/04/17  Yes [name], MD  metolazone  (ZAROXOLYN) 2.5 MG tablet Take 1 tablet (2.5 mg total) by mouth daily. For 2 days, then as ordered by the CHF clinic 07/10/17 10/08/17 Yes Clegg, Amy D, NP  mirtazapine (REMERON) 7.5 MG tablet TAKE 1 TABLET BY MOUTH AT BEDTIME 07/08/17  Yes [name], MD  omeprazole (PRILOSEC) 40 MG capsule Take 40 mg by mouth daily.   Yes [provider]  rosuvastatin (CRESTOR) 10 MG tablet Take 10 mg by mouth daily.   Yes [provider]  torsemide (DEMADEX) 20 MG tablet Take 2 tablets (40 mg total) by mouth 2 (two) times daily. 07/08/17 10/06/17 Yes Ivory Maduro, Shaune Pascal, MD  vitamin B-12 (CYANOCOBALAMIN) 500 MCG tablet Take 500 mcg by mouth every other day.   Yes [provider]  glucose blood test strip Use as instructed 03/05/17   [name], MD  potassium chloride SA (K-DUR,KLOR-CON) 20 MEQ tablet Take 1 tablet (20 mEq total) by mouth as directed. With every dose of Metolazone 07/10/17 10/08/17  Clegg, Amy D, NP  spironolactone (ALDACTONE) 25 MG tablet Take 0.5 tablets (12.5 mg total) by mouth daily. Patient not taking: Reported on 07/10/2017 06/29/17 07/29/17  Dessa Phi, DO    Past Medical History: Past Medical History:  Diagnosis Date  . Anemia 11/2016  . Arthritis    "some; hands/fingers" (06/25/2017)  . Atrial tachycardia ([place])   . Barrett's esophagus   . Basal cell carcinoma    "face, arms, hands" (06/25/2017)  . Chicken pox   . Cholelithiasis 11/2016   S/P LAP CHOLE  . Chronic combined systolic and diastolic CHF (congestive heart failure) ([place])   . CKD (chronic kidney disease), stage III ([place])   . Esophageal varices (H[facility])    Secondary to NAFLD  . Family history of adverse reaction to anesthesia    "daughter's sat drop very low; last OR had to be reintubated" (06/25/2017)  . GERD (gastroesophageal reflux disease)   . High cholesterol   . Hypertension   . Mitral regurgitation   . NAFLD (nonalcoholic fatty liver disease)   . NICM (nonischemic  cardiomyopathy) ([place])   . NSVT (nonsustained ventricular tachycardia) ([place])   . Pneumonia 09/2016  . RBBB   . Tricuspid regurgitation   . Type II diabetes mellitus ([place])     Past Surgical History: Past Surgical History:  Procedure Laterality Date  . BACK SURGERY    . BASAL CELL CARCINOMA EXCISION Right    temple; "had to do skin graft"  . CARDIAC CATHETERIZATION  09/2016   Minimal, non-occlusive CAD, EF 45% [facility])  . CARPAL TUNNEL RELEASE Bilateral   . CATARACT EXTRACTION W/ INTRAOCULAR LENS  IMPLANT, BILATERAL Bilateral   . CHOLECYSTECTOMY N/A 11/22/2016   Procedure: LAPAROSCOPIC CHOLECYSTECTOMY;  Surgeon: Coralie Keens, MD;  Location: [place];  Service: General;  Laterality: N/A;  . JOINT REPLACEMENT    . LUMBAR DISC SURGERY  X 2  . TONSILLECTOMY AND ADENOIDECTOMY    . TOTAL KNEE ARTHROPLASTY Left 2010  . TRANSESOPHAGEAL ECHOCARDIOGRAM  09/2016   Mild global LV hypokinesis. Moderate MR. Moderate TR. [facility])  . TRANSTHORACIC ECHOCARDIOGRAM  10/2016   [facility]) EF 35-40% with moderate HK. Normal RV function. Moderately severe TR.  Marland Kitchen TRANSTHORACIC ECHOCARDIOGRAM  03/2017    [facility]) mild concentric LVH. Moderately reduced EF of 40-45%. Diffuse HK with no regional wall motion abnormality. GR 2 DD. Septal dyssynergy. Mild MR, moderate TR. Severe RA dilation..    Family History: Family History  Problem Relation Age of Onset  . Cancer Father   . Cataracts Father   . Breast cancer Sister   . Cirrhosis Neg Hx     Social History: Social History   Socioeconomic History  . Marital status: Married    Spouse name: Not on file  . Number of children: Not on file  . Years of education: Not on file  . Highest education level: Not on file  Occupational History  . Not on file  Social Needs  . Financial resource strain: Not on file  . Food insecurity:    Worry: Not on file    Inability: Not on file  . Transportation needs:     Medical: Not on file    Non-medical: Not on file  Tobacco Use  . Smoking status: Former Smoker    Packs/day: 0.30    Years: 15.00    Pack years: 4.50    Types: Cigarettes    Last attempt to quit: 03/17/1977    Years since quitting: 40.3  . Smokeless tobacco: Never Used  Substance and Sexual Activity  . Alcohol use: Yes    Comment: 06/25/2017 "nothing since 09/2016;  used to have glass of wine q hs"  . Drug use: No  . Sexual activity: Not on file  Lifestyle  . Physical activity:    Days per week: Not on file    Minutes per session: Not on file  . Stress: Not on file  Relationships  . Social connections:    Talks on phone: Not on file    Gets together: Not on file    Attends religious service: Not on file    Active member of club or organization: Not on file    Attends meetings of clubs or organizations: Not on file    Relationship status: Not on file  Other Topics Concern  . Not on file  Social History Narrative   Accompanied by his daughter --> also a patient of Dr. Ellyn Hack    Allergies:  Allergies  Allergen Reactions  . Spironolactone     After initiation in 04/2017, had hyperkalemia on follow-up labwork so FYI    Objective:    Vital Signs:   Temp:  [102.2 F (39 C)] 102.2 F (39 C) (03/28 1125) Pulse Rate:  [85-91] 91 (03/28 1215) Resp:  [18-29] 29 (03/28 1215) BP: (95-128)/(49-75) 128/75 (03/28 1215) SpO2:  [96 %-97 %] 96 % (03/28 1215) Weight:  [187 lb (84.8 kg)] 187 lb (84.8 kg) (03/28 1139)    Weight change: Filed Weights   07/10/17 1125 07/10/17 1139  Weight: 187 lb (84.8 kg) 187 lb (84.8 kg)    Intake/Output:  No intake or output data in the 24 hours ending 07/10/17 1411    Physical Exam    General:  Not toxic appeaing. NAD. Coarse cough.  HEENT: normal Neck: supple. JVP at least 9-10 cm. Carotids 2+ bilat; no bruits. No lymphadenopathy or thyromegaly appreciated. Cor: PMI nondisplaced. Regular rate & rhythm. No rubs, gallops or  murmurs. Lungs: Diminished throughout with scattered rhonchi that slightly clear with cough. No wheeze Abdomen: soft, nontender, nondistended. No hepatosplenomegaly. No bruits or masses. Good bowel sounds. Extremities: no cyanosis, clubbing, or rash. Trace to 1+ edema Compression hose.  Neuro: alert & orientedx3, cranial nerves grossly intact. moves all 4 extremities w/o difficulty. Affect pleasant Skin: warm to the touch.   Telemetry   NSR 80s, personally reviewed.   EKG    Sinus 84 bpm with 1st degree AV block, personally reviewed.   Labs   Basic Metabolic Panel: Recent Labs  Lab 07/04/17 1357 07/10/17 1231  NA 140 140  K 4.0 3.5  CL 96* 94*  CO2 32 31  GLUCOSE 191* 68  BUN 42* 33*  CREATININE 1.83* 1.98*  CALCIUM 9.1 9.1    Liver Function Tests: Recent Labs  Lab 07/10/17 1231  AST 40  ALT 30  ALKPHOS 244*  BILITOT 1.6*  PROT 7.6  ALBUMIN 3.7   No results for input(s): LIPASE, AMYLASE in the last 168 hours. No results for input(s): AMMONIA in the last 168 hours.  CBC: Recent Labs  Lab 07/10/17 1231  WBC 8.8  NEUTROABS 6.2  HGB 12.3*  HCT 39.9  MCV 87.3  PLT 200    Cardiac Enzymes: No results for input(s): CKTOTAL, CKMB, CKMBINDEX, TROPONINI in the last 168 hours.  BNP: BNP (last 3 results) Recent Labs    11/19/16 1326 03/27/17 1215 06/25/17 1207  BNP 439.5* 489.5* 420.9*    ProBNP (last 3 results) No results for input(s): PROBNP in the last 8760 hours.   CBG: No results for input(s): GLUCAP in the last 168 hours.  Coagulation Studies: Recent Labs    07/10/17 1231  LABPROT 15.9*  INR 1.28     Imaging   Dg Chest 2 View  Result Date: 07/10/2017 CLINICAL DATA:  Seven month history of shortness of breath, 1 week of productive cough, fever to greater than 102 degrees today. History of CHF, previous episodes of pneumonia, former smoker. EXAM: CHEST - 2 VIEW COMPARISON:  PA and lateral chest x-ray of June 29, 2017 FINDINGS: The  lungs are adequately inflated. There are bilateral pleural effusions which appears stable on the left and slightly larger on the right. There is no alveolar infiltrate. There is linear increased density at the left lung base that is stable. The heart and pulmonary vascularity are normal. The mediastinum is normal in width. There is calcification in the wall of the aortic arch. There is calcification of portions of the anterior longitudinal ligament of the thoracic spine. IMPRESSION: Bibasilar atelectasis with small pleural effusions not greatly changed from the previous study though the right pleural effusion is slightly more conspicuous. No pneumonia or overt pulmonary edema. Thoracic aortic atherosclerosis. Electronically Signed   By: David  Martinique M.D.   On: 07/10/2017 12:08      Medications:     Current Medications: . albuterol  2.5 mg Nebulization Q6H  . [START ON 07/11/2017] aspirin EC  81 mg Oral Daily  . carvedilol  6.25 mg Oral BID WC  . enoxaparin (LOVENOX) injection  40 mg Subcutaneous Q24H  . gabapentin  800 mg Oral QHS  . insulin aspart  0-5 Units Subcutaneous QHS  . insulin aspart  0-9 Units Subcutaneous TID WC  . metolazone  2.5 mg Oral Daily  . mirtazapine  7.5 mg Oral QHS  . [START ON 07/11/2017] pantoprazole  40 mg Oral Daily  . potassium chloride SA  20 mEq Oral Daily  .  rosuvastatin  10 mg Oral Daily  . sodium chloride flush  3 mL Intravenous Q12H  . spironolactone  12.5 mg Oral Daily  . torsemide  40 mg Oral BID  . vitamin B-12  500 mcg Oral QODAY     Infusions: . sodium chloride    . ceFEPime (MAXIPIME) IV    . ceFEPime (MAXIPIME) IV    . [START ON 07/11/2017] vancomycin    . vancomycin         Patient Profile   Dillon Andrews is a 77 y.o. male with a hx of chronic combined CHF, NICM, tricuspid regurgitation, mitral regurgitation, CKD III,presumedNASH(per pt, never proven),esophageal varices per chart(prior glass of wine occasionally but no heavy  alcohol),HTN, HLD, DM, Barrett's esophagus, GERD, anemia, RBBB, and cardiac amyloidosis.  Admitted 07/10/17 with fever and productive cough.   Assessment/Plan   1. Fever/Productive cough - THM to admit. Covering for HCAP with Vanc/Cefepime  2. Acute on chronic combined HF. NICM by cath at [facility] 09/2016 with EF 40-45%. Echo 06/2017: EF 25-30%, mild LAE, mild/mod RV dilation, mod/severe RA. PYP scan strongly suggestive of TTR amyloid, SPEP with 0.8% m-spike, UPEP with Bence Jones Protein positive, kappa type - Volume status elevated on exam.  - Will give dose of 60 mg IV lasix this evening and follow.  No metolazone for now.  - Continue coreg 6.25 mg BID. - Continue spiro 12.5 mg daily - No ARB/entresto with CKD - Genetic testing sent.  - Refer to Dr Posey Pronto with neurology  3. CKD III, baseline ~1.6 - Creatinine up to 1.98. Follow closely.   4. Hx of moderate mitral regurgitation on [facility] TEE 09/2016 - No mention of MR on more recent Echo.   5. Recurrent bilateral effusions - R>L on CXR. Follow.   Medication concerns reviewed with patient and pharmacy team. Barriers identified: None at this time.   Length of Stay: 0  Annamaria Helling  07/10/2017, 2:11 PM  Advanced Heart Failure Team Pager [phone] (M-F; 7a - 4p)  Please contact [facility] for night-coverage after hours (4p -7a ) and weekends on [email or web address]  Patient seen and examined with the above-signed Advanced Practice Provider and/or Housestaff. I personally reviewed laboratory data, imaging studies and relevant notes. I independently examined the patient and formulated the important aspects of the plan. I have edited the note to reflect any of my changes or salient points. I have personally discussed the plan with the patient and/or family.  Difficult case. 78 y/o male with recently diagnosed diffuse cardiac amyloidosis now presents with recurrent respiratory failure and productive cough. He continues to have  volume overload but symptoms also concerning for infectious process though PCT negative. Would treat for presumed HCAP. Agree with IV lasix and assess response. Will need to follow closely with CKD. Prognosis guarded with advanced cardiac amyloidosis.   Glori Bickers, MD  6:30 PM

## 2017-07-10 NOTE — ED Provider Notes (Addendum)
[facility] EMERGENCY DEPARTMENT Provider Note   CSN: [id number] Arrival date & time: 07/10/17  1117     History   Chief Complaint Chief Complaint  Patient presents with  . Shortness of Breath    HPI Dillon Andrews is a 77 y.o. male.  HPI Patient reports he has felt fatigued and short of breath since his discharge from the hospital 3\17.  He was admitted for CHF exacerbation.  Since then he did have a weight gain of approximately 5 pounds.  He had seen Dr. Ronna Polio and he initiated torsemide.  The patient is family reports that they have not seen an improvement.  Patient reports that over the past 2 days he is gotten worse.  He reports he now has a productive cough with yellow sputum.  Today fever to 102 was identified.  Patient denies chest pain.  No confusion.  Patient does not wear home oxygen. Past Medical History:  Diagnosis Date  . Anemia 11/2016  . Arthritis    "some; hands/fingers" (06/25/2017)  . Atrial tachycardia ([place])   . Barrett's esophagus   . Basal cell carcinoma    "face, arms, hands" (06/25/2017)  . Chicken pox   . Cholelithiasis 11/2016   S/P LAP CHOLE  . Chronic combined systolic and diastolic CHF (congestive heart failure) ([place])   . CKD (chronic kidney disease), stage III ([place])   . Esophageal varices (HCC)    Secondary to NAFLD  . Family history of adverse reaction to anesthesia    "daughter's sat drop very low; last OR had to be reintubated" (06/25/2017)  . GERD (gastroesophageal reflux disease)   . High cholesterol   . Hypertension   . Mitral regurgitation   . NAFLD (nonalcoholic fatty liver disease)   . NICM (nonischemic cardiomyopathy) ([place])   . NSVT (nonsustained ventricular tachycardia) ([place])   . Pneumonia 09/2016  . RBBB   . Tricuspid regurgitation   . Type II diabetes mellitus [facility])     Patient Active Problem List   Diagnosis Date Noted  . Acute respiratory failure ([place]) 07/10/2017  . CHF exacerbation ([place])  06/25/2017  . Hypokalemia 06/25/2017  . Non-ischemic cardiomyopathy ([place]) 05/12/2017  . Mitral regurgitation 05/12/2017  . Other secondary pulmonary hypertension ([place]) 05/12/2017  . RBBB 05/12/2017  . Type II diabetes mellitus ([place])   . Essential hypertension   . CKD (chronic kidney disease), stage III ([place])   . Hospital discharge follow-up 04/03/2017  . SOB (shortness of breath) 03/05/2017  . Insomnia 03/05/2017  . Stress 03/05/2017  . Restless leg syndrome 03/05/2017  . Cholecystitis 11/22/2016  . Pneumonia 11/21/2016  . HCAP (healthcare-associated pneumonia) 11/19/2016  . CKD (chronic kidney disease) stage 3, GFR 30-59 ml/min (HCC) 11/19/2016  . DM2 (diabetes mellitus, type 2) ([place]) 11/19/2016  . Chronic combined systolic and diastolic heart failure ([place]) 11/19/2016  . Bilateral pleural effusion 11/19/2016  . Epigastric pain 11/19/2016  . Abnormal serum protein electrophoresis 11/19/2016  . Esophageal varices in cirrhosis ([place]) 11/19/2016    Past Surgical History:  Procedure Laterality Date  . BACK SURGERY    . BASAL CELL CARCINOMA EXCISION Right    temple; "had to do skin graft"  . CARDIAC CATHETERIZATION  09/2016   Minimal, non-occlusive CAD, EF 45% [facility])  . CARPAL TUNNEL RELEASE Bilateral   . CATARACT EXTRACTION W/ INTRAOCULAR LENS  IMPLANT, BILATERAL Bilateral   . CHOLECYSTECTOMY N/A 11/22/2016   Procedure: LAPAROSCOPIC CHOLECYSTECTOMY;  Surgeon: Coralie Keens, MD;  Location:  [facility] OR;  Service: General;  Laterality: N/A;  . JOINT REPLACEMENT    . LUMBAR DISC SURGERY  X 2  . TONSILLECTOMY AND ADENOIDECTOMY    . TOTAL KNEE ARTHROPLASTY Left 2010  . TRANSESOPHAGEAL ECHOCARDIOGRAM  09/2016   Mild global LV hypokinesis. Moderate MR. Moderate TR. [facility])  . TRANSTHORACIC ECHOCARDIOGRAM  10/2016   [facility]) EF 35-40% with moderate HK. Normal RV function. Moderately severe TR.  Marland Kitchen TRANSTHORACIC ECHOCARDIOGRAM  03/2017    [facility]) mild concentric LVH. Moderately reduced EF of 40-45%. Diffuse HK with no regional wall motion abnormality. GR 2 DD. Septal dyssynergy. Mild MR, moderate TR. Severe RA dilation..        Home Medications    Prior to Admission medications   Medication Sig Start Date End Date Taking? Authorizing Provider  aspirin EC 81 MG tablet Take 81 mg by mouth daily. 12/23/14  Yes [provider]  carvedilol (COREG) 6.25 MG tablet Take 1 tablet (6.25 mg total) by mouth 2 (two) times daily with a meal. 04/29/17  Yes Leonie Man, MD  gabapentin (NEURONTIN) 800 MG tablet Take 1 tablet (800 mg total) by mouth at bedtime. 03/05/17  Yes [name], MD  glipiZIDE (GLUCOTROL) 5 MG tablet Take 0.5 tablets (2.5 mg total) by mouth 2 (two) times daily. 07/04/17  Yes [name], MD  metolazone (ZAROXOLYN) 2.5 MG tablet Take 1 tablet (2.5 mg total) by mouth daily. For 2 days, then as ordered by the CHF clinic 07/10/17 10/08/17 Yes Clegg, Amy D, NP  mirtazapine (REMERON) 7.5 MG tablet TAKE 1 TABLET BY MOUTH AT BEDTIME 07/08/17  Yes [name], MD  omeprazole (PRILOSEC) 40 MG capsule Take 40 mg by mouth daily.   Yes [provider]  rosuvastatin (CRESTOR) 10 MG tablet Take 10 mg by mouth daily.   Yes [provider]  torsemide (DEMADEX) 20 MG tablet Take 2 tablets (40 mg total) by mouth 2 (two) times daily. 07/08/17 10/06/17 Yes Bensimhon, Shaune Pascal, MD  vitamin B-12 (CYANOCOBALAMIN) 500 MCG tablet Take 500 mcg by mouth every other day.   Yes [provider]  glucose blood test strip Use as instructed 03/05/17   [name], MD  potassium chloride SA (K-DUR,KLOR-CON) 20 MEQ tablet Take 1 tablet (20 mEq total) by mouth as directed. With every dose of Metolazone 07/10/17 10/08/17  Clegg, Amy D, NP  spironolactone (ALDACTONE) 25 MG tablet Take 0.5 tablets (12.5 mg total) by mouth daily. Patient not taking: Reported on 07/10/2017 06/29/17 07/29/17   Dessa Phi, DO    Family History Family History  Problem Relation Age of Onset  . Cancer Father   . Cataracts Father   . Breast cancer Sister   . Cirrhosis Neg Hx     Social History Social History   Tobacco Use  . Smoking status: Former Smoker    Packs/day: 0.30    Years: 15.00    Pack years: 4.50    Types: Cigarettes    Last attempt to quit: 03/17/1977    Years since quitting: 40.3  . Smokeless tobacco: Never Used  Substance Use Topics  . Alcohol use: Yes    Comment: 06/25/2017 "nothing since 09/2016;  used to have glass of wine q hs"  . Drug use: No     Allergies   Spironolactone   Review of Systems Review of Systems 10 Systems reviewed and are negative for acute change except as noted in the  HPI.   Physical Exam Updated Vital Signs BP 128/75   Pulse 91   Temp (!) 102.2 F (39 C) (Oral)   Resp (!) 29   Ht 5' 11"  (1.803 m)   Wt 84.8 kg (187 lb)   SpO2 96%   BMI 26.08 kg/m   Physical Exam  Constitutional: He is oriented to person, place, and time. He appears well-developed and well-nourished. No distress.  HENT:  Head: Normocephalic and atraumatic.  Mouth/Throat: Oropharynx is clear and moist.  Eyes: Conjunctivae and EOM are normal.  Neck: Neck supple.  Cardiovascular: Normal rate and regular rhythm.  No murmur heard. Pulmonary/Chest: Effort normal. No respiratory distress.  Room air saturation 88-92%.  Breath sounds are diminished at the bases.  Occasional cough with deep inspiration.  Cough is wet.  Abdominal: Soft. He exhibits no distension. There is no tenderness.  Musculoskeletal: He exhibits no edema.  1-2+ pitting edema bilateral lower extremities.  Neurological: He is alert and oriented to person, place, and time. No cranial nerve deficit. He exhibits normal muscle tone. Coordination normal.  Skin: Skin is warm and dry.  Psychiatric: He has a normal mood and affect.  Nursing note and vitals reviewed.    ED Treatments / Results   Labs (all labs ordered are listed, but only abnormal results are displayed) Labs Reviewed  COMPREHENSIVE METABOLIC PANEL - Abnormal; Notable for the following components:      Result Value   Chloride 94 (*)    BUN 33 (*)    Creatinine, Ser 1.98 (*)    Alkaline Phosphatase 244 (*)    Total Bilirubin 1.6 (*)    GFR calc non Af Amer 31 (*)    GFR calc Af Amer 36 (*)    All other components within normal limits  CBC WITH DIFFERENTIAL/PLATELET - Abnormal; Notable for the following components:   Hemoglobin 12.3 (*)    RDW 20.4 (*)    Monocytes Absolute 1.4 (*)    All other components within normal limits  PROTIME-INR - Abnormal; Notable for the following components:   Prothrombin Time 15.9 (*)    All other components within normal limits  CULTURE, BLOOD (ROUTINE X 2)  CULTURE, BLOOD (ROUTINE X 2)  CULTURE, EXPECTORATED SPUTUM-ASSESSMENT  GRAM STAIN  URINALYSIS, ROUTINE W REFLEX MICROSCOPIC  INFLUENZA PANEL BY PCR (TYPE A & B)  STREP PNEUMONIAE URINARY ANTIGEN  HEMOGLOBIN A1C  I-STAT CG4 LACTIC ACID, ED    EKG EKG Interpretation  Date/Time:  Thursday July 10 2017 11:23:49 EDT Ventricular Rate:  84 PR Interval:  212 QRS Duration: 160 QT Interval:  412 QTC Calculation: 486 R Axis:   -92 Text Interpretation:  Sinus rhythm with 1st degree A-V block with occasional Premature ventricular complexes Right bundle branch block Inferior infarct , age undetermined Anterior infarct , age undetermined Abnormal ECG no sig change from revious Confirmed by Charlesetta Shanks [phone]) on 07/10/2017 1:03:54 PM   Radiology Dg Chest 2 View  Result Date: 07/10/2017 CLINICAL DATA:  Seven month history of shortness of breath, 1 week of productive cough, fever to greater than 102 degrees today. History of CHF, previous episodes of pneumonia, former smoker. EXAM: CHEST - 2 VIEW COMPARISON:  PA and lateral chest x-ray of June 29, 2017 FINDINGS: The lungs are adequately inflated. There are bilateral  pleural effusions which appears stable on the left and slightly larger on the right. There is no alveolar infiltrate. There is linear increased density at the left lung base that is stable.  The heart and pulmonary vascularity are normal. The mediastinum is normal in width. There is calcification in the wall of the aortic arch. There is calcification of portions of the anterior longitudinal ligament of the thoracic spine. IMPRESSION: Bibasilar atelectasis with small pleural effusions not greatly changed from the previous study though the right pleural effusion is slightly more conspicuous. No pneumonia or overt pulmonary edema. Thoracic aortic atherosclerosis. Electronically Signed   By: David  Martinique M.D.   On: 07/10/2017 12:08    Procedures Procedures (including critical care time) CRITICAL CARE Performed by: Si Gaul   Total critical care time: 20 minutes  Critical care time was exclusive of separately billable procedures and treating other patients.  Critical care was necessary to treat or prevent imminent or life-threatening deterioration.  Critical care was time spent personally by me on the following activities: development of treatment plan with patient and/or surrogate as well as nursing, discussions with consultants, evaluation of patient's response to treatment, examination of patient, obtaining history from patient or surrogate, ordering and performing treatments and interventions, ordering and review of laboratory studies, ordering and review of radiographic studies, pulse oximetry and re-evaluation of patient's condition. Medications Ordered in ED Medications  ceFEPIme (MAXIPIME) 1 g in sodium chloride 0.9 % 100 mL IVPB (has no administration in time range)  vancomycin (VANCOCIN) 1,500 mg in sodium chloride 0.9 % 500 mL IVPB (has no administration in time range)  vancomycin (VANCOCIN) 1,250 mg in sodium chloride 0.9 % 250 mL IVPB (has no administration in time range)  aspirin  EC tablet 81 mg (has no administration in time range)  carvedilol (COREG) tablet 6.25 mg (has no administration in time range)  gabapentin (NEURONTIN) tablet 800 mg (has no administration in time range)  mirtazapine (REMERON) tablet 7.5 mg (has no administration in time range)  pantoprazole (PROTONIX) EC tablet 40 mg (has no administration in time range)  rosuvastatin (CRESTOR) tablet 10 mg (has no administration in time range)  spironolactone (ALDACTONE) tablet 12.5 mg (has no administration in time range)  torsemide (DEMADEX) tablet 40 mg (has no administration in time range)  vitamin B-12 (CYANOCOBALAMIN) tablet 500 mcg (has no administration in time range)  enoxaparin (LOVENOX) injection 40 mg (has no administration in time range)  sodium chloride flush (NS) 0.9 % injection 3 mL (has no administration in time range)  sodium chloride flush (NS) 0.9 % injection 3 mL (has no administration in time range)  0.9 %  sodium chloride infusion (has no administration in time range)  ketorolac (TORADOL) 15 MG/ML injection 15 mg (has no administration in time range)  senna-docusate (Senokot-S) tablet 1 tablet (has no administration in time range)  ondansetron (ZOFRAN) tablet 4 mg (has no administration in time range)    Or  ondansetron (ZOFRAN) injection 4 mg (has no administration in time range)  albuterol (PROVENTIL) (2.5 MG/3ML) 0.083% nebulizer solution 2.5 mg (has no administration in time range)  ceFEPIme (MAXIPIME) 1 g in sodium chloride 0.9 % 100 mL IVPB (has no administration in time range)  insulin aspart (novoLOG) injection 0-9 Units (has no administration in time range)  insulin aspart (novoLOG) injection 0-5 Units (has no administration in time range)  metolazone (ZAROXOLYN) tablet 2.5 mg (has no administration in time range)  potassium chloride SA (K-DUR,KLOR-CON) CR tablet 20 mEq (has no administration in time range)     Initial Impression / Assessment and Plan / ED Course  I have  reviewed the triage vital signs and the nursing notes.  Pertinent labs & imaging results that were available during my care of the patient were reviewed by me and considered in my medical decision making (see chart for details).    Consult: [facility] hospitalist for admission. RA Saturation 88-92% patient does not wear home oxygen. Final Clinical Impressions(s) / ED Diagnoses   Final diagnoses:  HCAP (healthcare-associated pneumonia)  Acute on chronic congestive heart failure, unspecified heart failure type ([place])  Hypoxia  Patient presents as outlined above.  He is hospitalized for CHF.  At this time he has fever to 102 with productive cough.  Shortness of breath, fatigue have worsened in the past 2 days.  I suspect pneumonia in addition to mild CHF exacerbation.  Will plan to initiate treatment for H CAP and admit.  ED Discharge Orders    None       Charlesetta Shanks, MD 07/10/17 1338    Charlesetta Shanks, MD 07/10/17 [phone]

## 2017-07-10 NOTE — H&P (Signed)
History and Physical    Dillon Andrews:[id number] DOB: Nov 17, 1940 DOA: 07/10/2017  PCP: [name], MD Patient coming from: home  Chief Complaint: sob/cough/fever  HPI: Dillon Andrews is a very pleasant 77 y.o. male with medical history significant for combined CHF, and ICM, tricuspid regurg, mitral regurg, chronic kidney disease stage III, presumed [name], esophageal varices, hypertension, hyperlipidemia, diabetes, GERD, anemia, right bundle branch block, Barretts esophagus presents to the emergency Department chief complaint worsening shortness of breath accompanied by persistent coughing with increased sputum production and fever. Initial evaluation reveals acute respiratory failure likely related to healthcare associated pneumonia in setting of chronic combined HF. [facility] are asked to admit.  Information is obtained from the patient and his daughter who is at the bedside. He reports was discharged from the hospital approximately 10 days ago and has felt tired and short of breath ever since then. He states he was in the hospital for worsening CHF. He also states during that hospitalization was provided with torsemide. Since then he has had a 5 pound weight gain. He notes that he was seen in the heart failure clinic 2 days ago where once again his meds were adjusted and he was to have lost 5 pounds by today. He reports he's lost 3 of those 5 pounds. He states he was unable to eat dinner last night as he "just didn't feel well". He denies abdominal pain nausea vomiting diarrhea. He does endorse some generalized weakness and fatigue. Family states he is coughing more and has a thick yellow sputum. This morning fever reportedly 102. He denies chest pain palpitations headache dizziness syncope or near-syncope. He denies dysuria hematuria frequency or urgency. He states he has some mild lower extremity edema continues with orthopnea.    ED Course: in the emergency department  he has an oral temperature of 102.2. Blood pressure low end of normal and an oxygen saturation level 88% on room air. No leukocytosis lactic acid within the limits of normal. He is not tachycardic no tachypnea. Broad-spectrum antibiotics are initiated.  Review of Systems: As per HPI otherwise all other systems reviewed and are negative.   Ambulatory Status:  ambulates independently is independent with ADLs  Past Medical History:  Diagnosis Date  . Anemia 11/2016  . Arthritis    "some; hands/fingers" (06/25/2017)  . Atrial tachycardia ([place])   . Barrett's esophagus   . Basal cell carcinoma    "face, arms, hands" (06/25/2017)  . Chicken pox   . Cholelithiasis 11/2016   S/P LAP CHOLE  . Chronic combined systolic and diastolic CHF (congestive heart failure) ([place])   . CKD (chronic kidney disease), stage III ([place])   . Esophageal varices (HCC)    Secondary to NAFLD  . Family history of adverse reaction to anesthesia    "daughter's sat drop very low; last OR had to be reintubated" (06/25/2017)  . GERD (gastroesophageal reflux disease)   . High cholesterol   . Hypertension   . Mitral regurgitation   . NAFLD (nonalcoholic fatty liver disease)   . NICM (nonischemic cardiomyopathy) ([place])   . NSVT (nonsustained ventricular tachycardia) ([place])   . Pneumonia 09/2016  . RBBB   . Tricuspid regurgitation   . Type II diabetes mellitus ([place])     Past Surgical History:  Procedure Laterality Date  . BACK SURGERY    . BASAL CELL CARCINOMA EXCISION Right    temple; "had to do skin graft"  . CARDIAC CATHETERIZATION  09/2016   Minimal,  non-occlusive CAD, EF 45% [facility])  . CARPAL TUNNEL RELEASE Bilateral   . CATARACT EXTRACTION W/ INTRAOCULAR LENS  IMPLANT, BILATERAL Bilateral   . CHOLECYSTECTOMY N/A 11/22/2016   Procedure: LAPAROSCOPIC CHOLECYSTECTOMY;  Surgeon: Coralie Keens, MD;  Location: [place];  Service: General;  Laterality: N/A;  . JOINT REPLACEMENT    . LUMBAR DISC SURGERY   X 2  . TONSILLECTOMY AND ADENOIDECTOMY    . TOTAL KNEE ARTHROPLASTY Left 2010  . TRANSESOPHAGEAL ECHOCARDIOGRAM  09/2016   Mild global LV hypokinesis. Moderate MR. Moderate TR. [facility])  . TRANSTHORACIC ECHOCARDIOGRAM  10/2016   [facility]) EF 35-40% with moderate HK. Normal RV function. Moderately severe TR.  Marland Kitchen TRANSTHORACIC ECHOCARDIOGRAM  03/2017    [facility]) mild concentric LVH. Moderately reduced EF of 40-45%. Diffuse HK with no regional wall motion abnormality. GR 2 DD. Septal dyssynergy. Mild MR, moderate TR. Severe RA dilation..    Social History   Socioeconomic History  . Marital status: Married    Spouse name: Not on file  . Number of children: Not on file  . Years of education: Not on file  . Highest education level: Not on file  Occupational History  . Not on file  Social Needs  . Financial resource strain: Not on file  . Food insecurity:    Worry: Not on file    Inability: Not on file  . Transportation needs:    Medical: Not on file    Non-medical: Not on file  Tobacco Use  . Smoking status: Former Smoker    Packs/day: 0.30    Years: 15.00    Pack years: 4.50    Types: Cigarettes    Last attempt to quit: 03/17/1977    Years since quitting: 40.3  . Smokeless tobacco: Never Used  Substance and Sexual Activity  . Alcohol use: Yes    Comment: 06/25/2017 "nothing since 09/2016;  used to have glass of wine q hs"  . Drug use: No  . Sexual activity: Not on file  Lifestyle  . Physical activity:    Days per week: Not on file    Minutes per session: Not on file  . Stress: Not on file  Relationships  . Social connections:    Talks on phone: Not on file    Gets together: Not on file    Attends religious service: Not on file    Active member of club or organization: Not on file    Attends meetings of clubs or organizations: Not on file    Relationship status: Not on file  . Intimate partner violence:    Fear of current or ex partner: Not  on file    Emotionally abused: Not on file    Physically abused: Not on file    Forced sexual activity: Not on file  Other Topics Concern  . Not on file  Social History Narrative   Accompanied by his daughter --> also a patient of Dr. Ellyn Hack    Allergies  Allergen Reactions  . Spironolactone     After initiation in 04/2017, had hyperkalemia on follow-up labwork so FYI    Family History  Problem Relation Age of Onset  . Cancer Father   . Cataracts Father   . Breast cancer Sister   . Cirrhosis Neg Hx     Prior to Admission medications   Medication Sig Start Date End Date Taking? Authorizing Provider  aspirin EC 81 MG tablet Take 81 mg by  mouth daily. 12/23/14  Yes [provider]  carvedilol (COREG) 6.25 MG tablet Take 1 tablet (6.25 mg total) by mouth 2 (two) times daily with a meal. 04/29/17  Yes Leonie Man, MD  gabapentin (NEURONTIN) 800 MG tablet Take 1 tablet (800 mg total) by mouth at bedtime. 03/05/17  Yes [name], MD  glipiZIDE (GLUCOTROL) 5 MG tablet Take 0.5 tablets (2.5 mg total) by mouth 2 (two) times daily. 07/04/17  Yes [name], MD  metolazone (ZAROXOLYN) 2.5 MG tablet Take 1 tablet (2.5 mg total) by mouth daily. For 2 days, then as ordered by the CHF clinic 07/10/17 10/08/17 Yes Clegg, Amy D, NP  mirtazapine (REMERON) 7.5 MG tablet TAKE 1 TABLET BY MOUTH AT BEDTIME 07/08/17  Yes [name], MD  omeprazole (PRILOSEC) 40 MG capsule Take 40 mg by mouth daily.   Yes [provider]  rosuvastatin (CRESTOR) 10 MG tablet Take 10 mg by mouth daily.   Yes [provider]  torsemide (DEMADEX) 20 MG tablet Take 2 tablets (40 mg total) by mouth 2 (two) times daily. 07/08/17 10/06/17 Yes Bensimhon, Shaune Pascal, MD  vitamin B-12 (CYANOCOBALAMIN) 500 MCG tablet Take 500 mcg by mouth every other day.   Yes [provider]  glucose blood test strip Use as instructed 03/05/17   [name], MD    potassium chloride SA (K-DUR,KLOR-CON) 20 MEQ tablet Take 1 tablet (20 mEq total) by mouth as directed. With every dose of Metolazone 07/10/17 10/08/17  Clegg, Amy D, NP  spironolactone (ALDACTONE) 25 MG tablet Take 0.5 tablets (12.5 mg total) by mouth daily. Patient not taking: Reported on 07/10/2017 06/29/17 07/29/17  Dessa Phi, DO    Physical Exam: Vitals:   07/10/17 1125 07/10/17 1128 07/10/17 1139 07/10/17 1215  BP: (!) 95/49 115/61  128/75  Pulse: 85   91  Resp: 18   (!) 29  Temp: (!) 102.2 F (39 C)     TempSrc: Oral     SpO2: 97%   96%  Weight: 84.8 kg (187 lb)  84.8 kg (187 lb)   Height: 5' 11"  (1.803 m)  5' 11"  (1.803 m)      General:  Appears calm and comfortable sitting up in bed face flushed and no acute distress Eyes:  PERRL, EOMI, normal lids, iris ENT:  grossly normal hearing, lips & tongue, mucous membranes of his mouth are pink somewhat dry Neck:  no LAD, masses or thyromegaly Cardiovascular:  RRR, no m/r/g. trace LE edema.  Respiratory: normal effort. Breath sounds diminished bilateral bases with fine crackles noted improved air movement mid lobes and up. No wheeze Abdomen:  soft, ntnd, ositive bowel sounds throughout no guarding or rebounding Skin:  no rash or induration seen on limited exam Musculoskeletal:  grossly normal tone BUE/BLE, good ROM, no bony abnormality Psychiatric:  grossly normal mood and affect, speech fluent and appropriate, AOx3 Neurologic:  CN 2-12 grossly intact, moves all extremities in coordinated fashion, sensation intact  Labs on Admission: I have personally reviewed following labs and imaging studies  CBC: Recent Labs  Lab 07/10/17 1231  WBC 8.8  NEUTROABS 6.2  HGB 12.3*  HCT 39.9  MCV 87.3  PLT 537   Basic Metabolic Panel: Recent Labs  Lab 07/04/17 1357 07/10/17 1231  NA 140 140  K 4.0 3.5  CL 96* 94*  CO2 32 31  GLUCOSE 191* 68  BUN 42* 33*  CREATININE 1.83* 1.98*  CALCIUM 9.1 9.1  GFR: Estimated Creatinine  Clearance: 33.3 mL/min (A) (by C-G formula based on SCr of 1.98 mg/dL (H)). Liver Function Tests: Recent Labs  Lab 07/10/17 1231  AST 40  ALT 30  ALKPHOS 244*  BILITOT 1.6*  PROT 7.6  ALBUMIN 3.7   No results for input(s): LIPASE, AMYLASE in the last 168 hours. No results for input(s): AMMONIA in the last 168 hours. Coagulation Profile: Recent Labs  Lab 07/10/17 1231  INR 1.28   Cardiac Enzymes: No results for input(s): CKTOTAL, CKMB, CKMBINDEX, TROPONINI in the last 168 hours. BNP (last 3 results) No results for input(s): PROBNP in the last 8760 hours. HbA1C: Recent Labs    07/10/17 1231  HGBA1C 7.0*   CBG: No results for input(s): GLUCAP in the last 168 hours. Lipid Profile: No results for input(s): CHOL, HDL, LDLCALC, TRIG, CHOLHDL, LDLDIRECT in the last 72 hours. Thyroid Function Tests: No results for input(s): TSH, T4TOTAL, FREET4, T3FREE, THYROIDAB in the last 72 hours. Anemia Panel: No results for input(s): VITAMINB12, FOLATE, FERRITIN, TIBC, IRON, RETICCTPCT in the last 72 hours. Urine analysis:    Component Value Date/Time   COLORURINE YELLOW 07/10/2017 [place] 07/10/2017 1215   LABSPEC 1.006 07/10/2017 1215   PHURINE 7.0 07/10/2017 1215   GLUCOSEU NEGATIVE 07/10/2017 1215   HGBUR NEGATIVE 07/10/2017 1215   BILIRUBINUR NEGATIVE 07/10/2017 1215   KETONESUR NEGATIVE 07/10/2017 1215   PROTEINUR NEGATIVE 07/10/2017 1215   UROBILINOGEN 1.0 06/06/2008 0949   NITRITE NEGATIVE 07/10/2017 1215   LEUKOCYTESUR NEGATIVE 07/10/2017 1215    Creatinine Clearance: Estimated Creatinine Clearance: 33.3 mL/min (A) (by C-G formula based on SCr of 1.98 mg/dL (H)).  Sepsis Labs: @LABRCNTIP (procalcitonin:4,lacticidven:4) )No results found for this or any previous visit (from the past 240 hour(s)).   Radiological Exams on Admission: Dg Chest 2 View  Result Date: 07/10/2017 CLINICAL DATA:  Seven month history of shortness of breath, 1 week of  productive cough, fever to greater than 102 degrees today. History of CHF, previous episodes of pneumonia, former smoker. EXAM: CHEST - 2 VIEW COMPARISON:  PA and lateral chest x-ray of June 29, 2017 FINDINGS: The lungs are adequately inflated. There are bilateral pleural effusions which appears stable on the left and slightly larger on the right. There is no alveolar infiltrate. There is linear increased density at the left lung base that is stable. The heart and pulmonary vascularity are normal. The mediastinum is normal in width. There is calcification in the wall of the aortic arch. There is calcification of portions of the anterior longitudinal ligament of the thoracic spine. IMPRESSION: Bibasilar atelectasis with small pleural effusions not greatly changed from the previous study though the right pleural effusion is slightly more conspicuous. No pneumonia or overt pulmonary edema. Thoracic aortic atherosclerosis. Electronically Signed   By: David  Martinique M.D.   On: 07/10/2017 12:08    EKG: Sinus rhythm with 1st degree A-V block with occasional Premature ventricular complexes Right bundle branch block Inferior infarct , age undetermined Anterior infarct , age undetermined Abnormal ECG  Assessment/Plan Principal Problem:   Acute respiratory failure ([place]) Active Problems:   HCAP (healthcare-associated pneumonia)   DM2 (diabetes mellitus, type 2) ([place])   Chronic combined systolic and diastolic heart failure (HCC)   Bilateral pleural effusion   Esophageal varices in cirrhosis (HCC)   Restless leg syndrome   Essential hypertension   CKD (chronic kidney disease), stage III (HCC)   Non-ischemic cardiomyopathy (HCC)   Mitral regurgitation   #1. Acute  respiratory failure likely related to healthcare associated pneumonia in the setting of chronic mild heart failure. Oxygen saturation level 88% on room air. Chest x-ray with by basilar atelectasis small pleural effusion, no overt pneumonia or pulmonary  edema.  Max temperature 102.2, blood pressure on the low end of normal. No tachycardia no tachypnea. lactic acid within the limits of normal -admit to telemetry -Follow blood cultures -track lactic acid -Follow influenza panel -obtain sputum culture as able -Broad-spectrum antibiotics per protocol -Nebulizers -monitor oxygen saturation level -Oxygen supplementation  #2. Healthcare associated pneumonia. No leukocytosis but he does have a temperature of 102.2. worsening cough with sputum production. -blood cultures as noted above -Obtain sputum cultures -Strep pneumo urine antigen -supportive therapy -See #1  #3. chronic combined systolic diastolic heart failure/nonischemic cardiomyopathy/pleural effusion. Does not appear overloaded on presentation. Chest x-ray as noted above. EKG without acute changes. Discharged approximately 10 days ago for CHF exacerbation. Was seen in the heart failure clinic 2 days ago. He's lost 3 pounds since then. Echo done 2 weeks ago reveals severe LVH with an EF of 25% mild hypokinesis. Home medications include Coreg, Zaroxolyn, Demadex, Aldactone.  -Daily weights -Intake and output -Continue home meds -Of note Zaroxolyn added for 2 days only when family called heart failure clinic concerns over shortness of breath. -Heart failure team consult  #4.hypertension. Blood pressure low end of normal in the emergency department. -continue home meds -Monitor  5. Chronic kidney disease stage III. Creatinine 1.9. This appears to be early slightly above his baseline -Monitor -Recheck  #6. Diabetes. Serum glucose 68. Home medications include oral agents -hold oral agents for now -obtain a hemoglobin A1c - sliding Scale insulin for optimal control    DVT prophylaxis: heparin  Code Status: full  Family Communication: daughters at bedside  Disposition Plan: home  Consults called: heart failure team  Admission status: inpatient    Radene Gunning MD [facility]  If 7PM-7AM, please contact night-coverage [email or web address] Password [email or web address]  07/10/2017, 1:55 PM

## 2017-07-10 NOTE — Telephone Encounter (Signed)
Patient wife return call again to report, patient felt warm to the touch so she took his temperature Temp 102.1  Cough with greenish mucus  Not feeling well Unable to eat breakfast this am  Advised patient will need evaluation today advised to contact PCP for acute visit or triage appt, if he is unable to get appt, he should report to urgent care and as a last resort ER  Wife voiced understanding.

## 2017-07-10 NOTE — ED Notes (Signed)
Pt given apple sauce per Santiago Glad, NP

## 2017-07-10 NOTE — Progress Notes (Signed)
Pharmacy Antibiotic Note  Dillon Andrews is a 77 y.o. male admitted on 07/10/2017 with pneumonia.  Pharmacy has been consulted for vancomycin dosing.  Plan: Vancomycin 1556m once then 12579mIV every 24 hours.  Goal trough 15-20 mcg/mL. Monitor clinical progression and LOT Order vancomycin level as appropriate  Height: 5' 11"  (180.3 cm) Weight: 187 lb (84.8 kg) IBW/kg (Calculated) : 75.3  Temp (24hrs), Avg:102.2 F (39 C), Min:102.2 F (39 C), Max:102.2 F (39 C)  Recent Labs  Lab 07/04/17 1357 07/10/17 1231 07/10/17 1257  WBC  --  8.6  --   CREATININE 1.83*  --   --   LATICACIDVEN  --   --  1.79    Estimated Creatinine Clearance: 36 mL/min (A) (by C-G formula based on SCr of 1.83 mg/dL (H)).    Allergies  Allergen Reactions  . Spironolactone     After initiation in 04/2017, had hyperkalemia on follow-up labwork so FYI    Thank you for allowing pharmacy to be a part of this patient's care.  ToJodean Limaudisill 07/10/2017 1:12 PM

## 2017-07-10 NOTE — Progress Notes (Signed)
PHARMACY NOTE:  ANTIMICROBIAL RENAL DOSAGE ADJUSTMENT  Current antimicrobial regimen includes a mismatch between antimicrobial dosage and estimated renal function.  As per policy approved by the Pharmacy & Therapeutics and Medical Executive Committees, the antimicrobial dosage will be adjusted accordingly.  Current antimicrobial dosage:  Cefepime 1gm IV q8h  Indication: PNA  Renal Function:  Estimated Creatinine Clearance: 33.3 mL/min (A) (by C-G formula based on SCr of 1.98 mg/dL (H)).     Antimicrobial dosage has been changed to:  Cefepime 1gm IV q24hr  Additional comments:   Thank you for allowing pharmacy to be a part of this patient's care.  Hildred Laser, PharmD Clinical Pharmacist 07/10/2017 5:14 PM

## 2017-07-10 NOTE — ED Triage Notes (Signed)
States shortness of breath for a while and and has been adjusting his medications recently. Has productive cough green sputum.

## 2017-07-10 NOTE — Telephone Encounter (Signed)
Patient does not feel well. Cough with mucus Denies c/P +SOB, edema, and abdominal distention (not any worse or better from OV 3/26)  Above reviewed with Amy Clegg,NP Add metolazone 2.5 mg, x 2 doses and potassium 20 meq x 2 doses Be sure to return for labs 3/29  Above instructions given to patients wife. Advised patient can try OTC cough medications per the CHF clinic guidelines, although cough maybe more related to fluid. If cough continues with mucus should advise triage at lab appt 3/29 may consider CXR

## 2017-07-10 NOTE — ED Notes (Signed)
Pt states that he has had a productive cough since Saturday; pt states he is coughing up "green goopy stuff"

## 2017-07-10 NOTE — ED Notes (Signed)
Cards rounded at bedside

## 2017-07-11 ENCOUNTER — Other Ambulatory Visit (HOSPITAL_COMMUNITY): Payer: Medicare HMO

## 2017-07-11 DIAGNOSIS — J069 Acute upper respiratory infection, unspecified: Secondary | ICD-10-CM

## 2017-07-11 DIAGNOSIS — J9601 Acute respiratory failure with hypoxia: Secondary | ICD-10-CM

## 2017-07-11 LAB — BASIC METABOLIC PANEL
Anion gap: 16 — ABNORMAL HIGH (ref 5–15)
BUN: 33 mg/dL — ABNORMAL HIGH (ref 6–20)
CALCIUM: 8.7 mg/dL — AB (ref 8.9–10.3)
CHLORIDE: 96 mmol/L — AB (ref 101–111)
CO2: 26 mmol/L (ref 22–32)
Creatinine, Ser: 2.03 mg/dL — ABNORMAL HIGH (ref 0.61–1.24)
GFR calc Af Amer: 35 mL/min — ABNORMAL LOW (ref >60)
GFR calc non Af Amer: 30 mL/min — ABNORMAL LOW (ref >60)
GLUCOSE: 107 mg/dL — AB (ref 65–99)
Potassium: 4 mmol/L (ref 3.5–5.1)
Sodium: 138 mmol/L (ref 135–145)

## 2017-07-11 LAB — CBC
HCT: 39.1 % (ref 39.0–52.0)
Hemoglobin: 12.1 g/dL — ABNORMAL LOW (ref 13.0–17.0)
MCH: 27.1 pg (ref 26.0–34.0)
MCHC: 30.9 g/dL (ref 30.0–36.0)
MCV: 87.5 fL (ref 78.0–100.0)
Platelets: 158 10*3/uL (ref 150–400)
RBC: 4.47 MIL/uL (ref 4.22–5.81)
RDW: 20.5 % — ABNORMAL HIGH (ref 11.5–15.5)
WBC: 9.1 10*3/uL (ref 4.0–10.5)

## 2017-07-11 LAB — GLUCOSE, CAPILLARY
GLUCOSE-CAPILLARY: 135 mg/dL — AB (ref 65–99)
Glucose-Capillary: 120 mg/dL — ABNORMAL HIGH (ref 65–99)
Glucose-Capillary: 129 mg/dL — ABNORMAL HIGH (ref 65–99)
Glucose-Capillary: 127 mg/dL — ABNORMAL HIGH (ref 65–99)

## 2017-07-11 LAB — MAGNESIUM: Magnesium: 1.8 mg/dL (ref 1.7–2.4)

## 2017-07-11 LAB — MRSA PCR SCREENING: MRSA by PCR: NEGATIVE

## 2017-07-11 MED ORDER — SPIRONOLACTONE 12.5 MG HALF TABLET
12.5000 mg | ORAL_TABLET | Freq: Every day | ORAL | Status: DC
  Administered 2017-07-12: 12.5 mg via ORAL
  Filled 2017-07-11 (×3): qty 1

## 2017-07-11 MED ORDER — SPIRONOLACTONE 12.5 MG HALF TABLET
12.5000 mg | ORAL_TABLET | Freq: Every day | ORAL | Status: DC
  Administered 2017-07-11: 12.5 mg via ORAL
  Filled 2017-07-11: qty 1

## 2017-07-11 MED ORDER — FUROSEMIDE 10 MG/ML IJ SOLN
60.0000 mg | Freq: Once | INTRAMUSCULAR | Status: AC
  Administered 2017-07-11: 60 mg via INTRAVENOUS
  Filled 2017-07-11: qty 6

## 2017-07-11 MED ORDER — MIRTAZAPINE 15 MG PO TABS
15.0000 mg | ORAL_TABLET | Freq: Every day | ORAL | Status: DC
  Administered 2017-07-11: 15 mg via ORAL
  Filled 2017-07-11: qty 1

## 2017-07-11 MED ORDER — ALBUTEROL SULFATE (2.5 MG/3ML) 0.083% IN NEBU: 2.5000 mg | INHALATION_SOLUTION | RESPIRATORY_TRACT | Status: DC | PRN

## 2017-07-11 MED ORDER — SPIRONOLACTONE 12.5 MG HALF TABLET
12.5000 mg | ORAL_TABLET | Freq: Every day | ORAL | Status: DC
  Filled 2017-07-11: qty 1

## 2017-07-11 MED ORDER — ALBUTEROL SULFATE (2.5 MG/3ML) 0.083% IN NEBU
2.5000 mg | INHALATION_SOLUTION | Freq: Two times a day (BID) | RESPIRATORY_TRACT | Status: DC
  Filled 2017-07-11: qty 3

## 2017-07-11 MED ORDER — FUROSEMIDE 10 MG/ML IJ SOLN
80.0000 mg | Freq: Two times a day (BID) | INTRAMUSCULAR | Status: DC
  Administered 2017-07-11 – 2017-07-12 (×2): 80 mg via INTRAVENOUS
  Filled 2017-07-11 (×2): qty 8

## 2017-07-11 MED ORDER — ALPRAZOLAM 0.5 MG PO TABS
0.5000 mg | ORAL_TABLET | Freq: Once | ORAL | Status: AC
  Administered 2017-07-11: 0.5 mg via ORAL
  Filled 2017-07-11: qty 1

## 2017-07-11 NOTE — Plan of Care (Signed)
  Problem: Education: Goal: Knowledge of General Education information will improve Outcome: Progressing   Problem: Health Behavior/Discharge Planning: Goal: Ability to manage health-related needs will improve Outcome: Progressing   Problem: Clinical Measurements: Goal: Ability to maintain clinical measurements within normal limits will improve Outcome: Progressing Goal: Will remain free from infection Outcome: Progressing Goal: Diagnostic test results will improve Outcome: Progressing Goal: Respiratory complications will improve Outcome: Progressing Goal: Cardiovascular complication will be avoided Outcome: Progressing   Problem: Activity: Goal: Risk for activity intolerance will decrease Outcome: Progressing   Problem: Coping: Goal: Level of anxiety will decrease Outcome: Progressing   Problem: Elimination: Goal: Will not experience complications related to bowel motility Outcome: Progressing Goal: Will not experience complications related to urinary retention Outcome: Progressing   Problem: Safety: Goal: Ability to remain free from injury will improve Outcome: Progressing

## 2017-07-11 NOTE — Progress Notes (Signed)
[facility] Progress Note  Patient: Dillon Andrews XTK:[id number]   PCP: [name], MD DOB: 1940/06/09   DOA: 07/10/2017   DOS: 07/11/2017   Date of Service: the patient was seen and examined on 07/11/2017  Subjective: Feeling better.  Concern about not able to sleep since last few months.  No nausea no vomiting.  No chest pain.  Has some cough with blood-tinged sputum.  Brief hospital course: Pt. with PMH of  DM, NICM, NAFLD, HTN, HLD, esophageal varices, stage 3 CKD, and chronic combined CHF; admitted on 07/10/2017, presented with complaint of shortnes of breath, was found to have viral pneumonitis as well as acute on chronic combined CHF. Currently further plan is continue IV diuresis.  Assessment and Plan: 1.?  Sepsis,?  H CAP. Presented with complaints of cough and shortness of breath.  Had fever and tachycardia on admission. Lactic acid was normal, blood pressure is stable. Pro calcitonin level negative. Influenza PCR negative as well. Blood cultures were performed.  And patient was admitted for probable sepsis started on IV vancomycin and cefepime. Currently MRSA PCR is negative therefore I will discontinue vancomycin. Given that the patient still has some fever this morning I will continue cefepime for now and await for the blood cultures. If the cultures are negative I would discontinue all antibiotics. This appears more likely a viral bronchitis rather than an bacterial infection.  2.  Acute on chronic combined CHF. NICM Amyloid deposition disorder. Cardiac catheterization was negative. Recently EF 25%. Diffuse hypokinesis. 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 Cardiology-CHF service consulted. Currently on Coreg, IV Lasix, Aldactone. Monitoring and out and daily weight.  3.  Anxiety and insomnia. Patient requesting to be on benzodiazepine, I recommended that we will increase the dose of the Remeron that he is already taking  for insomnia. Monitor. Also on gabapentin which should help. Continue as needed Ambien.  4. CKD stage III. Renal function stable. Monitor.  5.  Type 2 diabetes mellitus. Hemoglobin A1c 7.0. Suggest controlled. Continue sliding scale insulin.  Diet: Cardiac and carb modified diet DVT Prophylaxis: subcutaneous Heparin  Advance goals of care discussion: full code  Family Communication: family was present at bedside, at the time of interview. The pt provided permission to discuss medical plan with the family. Opportunity was given to ask question and all questions were answered satisfactorily.   Disposition:  Discharge to home.  Consultants: cardiology Procedures: Echocardiogram   Antibiotics: Anti-infectives (From admission, onward)   Start     Dose/Rate Route Frequency Ordered Stop   07/11/17 1300  ceFEPIme (MAXIPIME) 1 g in sodium chloride 0.9 % 100 mL IVPB     1 g 200 mL/hr over 30 Minutes Intravenous Every 24 hours 07/10/17 1327 07/18/17 1259   07/11/17 1000  vancomycin (VANCOCIN) 1,250 mg in sodium chloride 0.9 % 250 mL IVPB  Status:  Discontinued     1,250 mg 166.7 mL/hr over 90 Minutes Intravenous Every 24 hours 07/10/17 1314 07/11/17 1438   07/10/17 1315  ceFEPIme (MAXIPIME) 1 g in sodium chloride 0.9 % 100 mL IVPB     1 g 200 mL/hr over 30 Minutes Intravenous  Once 07/10/17 1306 07/10/17 1441   07/10/17 1315  vancomycin (VANCOCIN) 1,500 mg in sodium chloride 0.9 % 500 mL IVPB     1,500 mg 250 mL/hr over 120 Minutes Intravenous  Once 07/10/17 1314 07/10/17 1610       Objective: Physical Exam: Vitals:   07/11/17 0816 07/11/17 9924  07/11/17 1124 07/11/17 1552  BP: (!) 104/55  114/74 114/64  Pulse: 86  81 83  Resp: (!) 22  (!) 22 (!) 21  Temp: 100.1 F (37.8 C)  98.4 F (36.9 C) 99.1 F (37.3 C)  TempSrc: Oral  Oral Oral  SpO2: 93% 92% 95% 95%  Weight:      Height:        Intake/Output Summary (Last 24 hours) at 07/11/2017 1924 Last data filed at  07/11/2017 1606 Gross per 24 hour  Intake 736 ml  Output 1525 ml  Net -789 ml   Filed Weights   07/10/17 1139 07/10/17 1727 07/11/17 0558  Weight: 84.8 kg (187 lb) 85.3 kg (188 lb 1 oz) 83.5 kg (184 lb 1.6 oz)   General: Alert, Awake and Oriented to Time, Place and Person. Appear in mild distress, affect appropriate Eyes: PERRL, Conjunctiva normal ENT: Oral Mucosa clear moist. Neck: no JVD, no Abnormal Mass Or lumps Cardiovascular: S1 and S2 Present, no Murmur, Peripheral Pulses Present Respiratory: normal respiratory effort, Bilateral Air entry equal and Decreased, no use of accessory muscle, Clear to Auscultation, no Crackles, no wheezes Abdomen: Bowel Sound present, Soft and no tenderness, no hernia Skin: no redness, no Rash, no induration Extremities: no Pedal edema, no calf tenderness Neurologic: Grossly no focal neuro deficit. Bilaterally Equal motor strength  Data Reviewed: CBC: Recent Labs  Lab 07/10/17 1231 07/11/17 0544  WBC 8.8 9.1  NEUTROABS 6.2  --   HGB 12.3* 12.1*  HCT 39.9 39.1  MCV 87.3 87.5  PLT 200 409   Basic Metabolic Panel: Recent Labs  Lab 07/10/17 1231 07/11/17 0544  NA 140 138  K 3.5 4.0  CL 94* 96*  CO2 31 26  GLUCOSE 68 107*  BUN 33* 33*  CREATININE 1.98* 2.03*  CALCIUM 9.1 8.7*  MG  --  1.8    Liver Function Tests: Recent Labs  Lab 07/10/17 1231  AST 40  ALT 30  ALKPHOS 244*  BILITOT 1.6*  PROT 7.6  ALBUMIN 3.7   No results for input(s): LIPASE, AMYLASE in the last 168 hours. No results for input(s): AMMONIA in the last 168 hours. Coagulation Profile: Recent Labs  Lab 07/10/17 1231  INR 1.28   Cardiac Enzymes: No results for input(s): CKTOTAL, CKMB, CKMBINDEX, TROPONINI in the last 168 hours. BNP (last 3 results) No results for input(s): PROBNP in the last 8760 hours. CBG: Recent Labs  Lab 07/10/17 1724 07/10/17 2121 07/11/17 0736 07/11/17 1204 07/11/17 1738  GLUCAP 159* 146* 135* 120* 129*   Studies: No  results found.  Scheduled Meds: . albuterol  2.5 mg Nebulization BID  . aspirin EC  81 mg Oral Daily  . carvedilol  6.25 mg Oral BID WC  . [START ON 07/12/2017] cyanocobalamin  500 mcg Oral QODAY  . furosemide  80 mg Intravenous BID  . gabapentin  800 mg Oral QHS  . heparin injection (subcutaneous)  5,000 Units Subcutaneous Q8H  . insulin aspart  0-5 Units Subcutaneous QHS  . insulin aspart  0-9 Units Subcutaneous TID WC  . mirtazapine  15 mg Oral QHS  . pantoprazole  40 mg Oral Daily  . rosuvastatin  10 mg Oral q1800  . sodium chloride flush  3 mL Intravenous Q12H  . spironolactone  12.5 mg Oral Daily   Continuous Infusions: . sodium chloride    . ceFEPime (MAXIPIME) IV Stopped (07/11/17 1606)   PRN Meds: sodium chloride, acetaminophen, albuterol, ondansetron **OR** ondansetron (ZOFRAN) IV, senna-docusate,  sodium chloride flush, zolpidem  Time spent: 35 minutes  Author: Berle Mull, MD [facility] Pager: [phone] 07/11/2017 7:24 PM  If 7PM-7AM, please contact night-coverage at [email or web address], password [facility]

## 2017-07-11 NOTE — Consult Note (Signed)
   [facility] Inpatient Consult   07/11/2017  March W Morella 09-29-1940 [id number]  Patient had been outreached from [facility] calls. Patient screened for re-hospiitalization in [facility] [employer] services with [facility] Medicare/ACO plan. Admitted with HCAP. Primary Care Provider: Abelino Derrick, MD [facility].   Met with the patient at the bedside. Patient states he was doing alright but started having increased shortness of breath.  Patient states he would like less calls but willing to continue with EMMI Pneumonia follow up. Patient denies having issues with appointments, transportation needs, or medication needs and management.   Pharmacy:  [facility]  Will continue to follow with EMMI.  Patient accepted and given a brochure with [facility] [employer] information, 24 hour nurse advise line and contact information.    For questions contact:   Natividad Brood, RN BSN [facility] Liaison  [phone] business mobile phone Toll free office [phone]

## 2017-07-11 NOTE — Progress Notes (Addendum)
Advanced Heart Failure Rounding Note  PCP-Cardiologist: Glenetta Hew, MD   Subjective:    -930 mls and down 4 lbs with 60 mg IV lasix x1. Creatinine 2.03. Tmax 100.1 overnight.   Feels SOB is better, but still orthopneic. Feels weak. No CP, dizziness. Still has productive cough with green sputum, but less coming out. No chills. No wheezing or hemoptysis.    Objective:   Weight Range: 184 lb 1.6 oz (83.5 kg) Body mass index is 25.68 kg/m.   Vital Signs:   Temp:  [99.2 F (37.3 C)-102.2 F (39 C)] 100.1 F (37.8 C) (03/29 0816) Pulse Rate:  [79-92] 86 (03/29 0816) Resp:  [17-34] 22 (03/29 0816) BP: (95-130)/(49-78) 104/55 (03/29 0816) SpO2:  [91 %-99 %] 93 % (03/29 0816) Weight:  [184 lb 1.6 oz (83.5 kg)-188 lb 1 oz (85.3 kg)] 184 lb 1.6 oz (83.5 kg) (03/29 0558)    Weight change: Filed Weights   07/10/17 1139 07/10/17 1727 07/11/17 0558  Weight: 187 lb (84.8 kg) 188 lb 1 oz (85.3 kg) 184 lb 1.6 oz (83.5 kg)    Intake/Output:   Intake/Output Summary (Last 24 hours) at 07/11/2017 0858 Last data filed at 07/11/2017 0700 Gross per 24 hour  Intake 220 ml  Output 1150 ml  Net -930 ml      Physical Exam    General:  Elderly. No resp difficulty + cough HEENT: Normal Neck: Supple. JVP jaw sitting upright. Carotids 2+ bilat; no bruits. No lymphadenopathy or thyromegaly appreciated. Cor: PMI nondisplaced. Regular rate & rhythm. + PVCs No rubs, gallops or murmurs. Lungs: very diminished in bases.  Mild crackles  Abdomen: Soft, nontender, nondistended. No hepatosplenomegaly. No bruits or masses. Good bowel sounds. Extremities: No cyanosis, clubbing, rash, BLE 1-2+ edema. TED hose on BLE Neuro: Alert & orientedx3, cranial nerves grossly intact. moves all 4 extremities w/o difficulty. Affect pleasant   Telemetry   SR 80's with IVCD and frequent PVC's (4-22/min). 11 beat run of NSVT this am.   EKG    No new tracings.   Labs    CBC Recent Labs     07/10/17 1231 07/11/17 0544  WBC 8.8 9.1  NEUTROABS 6.2  --   HGB 12.3* 12.1*  HCT 39.9 39.1  MCV 87.3 87.5  PLT 200 846   Basic Metabolic Panel Recent Labs    07/10/17 1231 07/11/17 0544  NA 140 138  K 3.5 4.0  CL 94* 96*  CO2 31 26  GLUCOSE 68 107*  BUN 33* 33*  CREATININE 1.98* 2.03*  CALCIUM 9.1 8.7*   Liver Function Tests Recent Labs    07/10/17 1231  AST 40  ALT 30  ALKPHOS 244*  BILITOT 1.6*  PROT 7.6  ALBUMIN 3.7   No results for input(s): LIPASE, AMYLASE in the last 72 hours. Cardiac Enzymes No results for input(s): CKTOTAL, CKMB, CKMBINDEX, TROPONINI in the last 72 hours.  BNP: BNP (last 3 results) Recent Labs    11/19/16 1326 03/27/17 1215 06/25/17 1207  BNP 439.5* 489.5* 420.9*    ProBNP (last 3 results) No results for input(s): PROBNP in the last 8760 hours.   D-Dimer No results for input(s): DDIMER in the last 72 hours. Hemoglobin A1C Recent Labs    07/10/17 1231  HGBA1C 7.0*   Fasting Lipid Panel No results for input(s): CHOL, HDL, LDLCALC, TRIG, CHOLHDL, LDLDIRECT in the last 72 hours. Thyroid Function Tests No results for input(s): TSH, T4TOTAL, T3FREE, THYROIDAB in the last 72 hours.  Invalid input(s): FREET3  Other results:   Imaging    Dg Chest 2 View  Result Date: 07/10/2017 CLINICAL DATA:  Seven month history of shortness of breath, 1 week of productive cough, fever to greater than 102 degrees today. History of CHF, previous episodes of pneumonia, former smoker. EXAM: CHEST - 2 VIEW COMPARISON:  PA and lateral chest x-ray of June 29, 2017 FINDINGS: The lungs are adequately inflated. There are bilateral pleural effusions which appears stable on the left and slightly larger on the right. There is no alveolar infiltrate. There is linear increased density at the left lung base that is stable. The heart and pulmonary vascularity are normal. The mediastinum is normal in width. There is calcification in the wall of the aortic  arch. There is calcification of portions of the anterior longitudinal ligament of the thoracic spine. IMPRESSION: Bibasilar atelectasis with small pleural effusions not greatly changed from the previous study though the right pleural effusion is slightly more conspicuous. No pneumonia or overt pulmonary edema. Thoracic aortic atherosclerosis. Electronically Signed   By: David  Martinique M.D.   On: 07/10/2017 12:08      Medications:     Scheduled Medications: . albuterol  2.5 mg Nebulization Q6H  . aspirin EC  81 mg Oral Daily  . carvedilol  6.25 mg Oral BID WC  . [START ON 07/12/2017] cyanocobalamin  500 mcg Oral QODAY  . gabapentin  800 mg Oral QHS  . heparin injection (subcutaneous)  5,000 Units Subcutaneous Q8H  . insulin aspart  0-5 Units Subcutaneous QHS  . insulin aspart  0-9 Units Subcutaneous TID WC  . mirtazapine  7.5 mg Oral QHS  . pantoprazole  40 mg Oral Daily  . potassium chloride SA  20 mEq Oral Daily  . rosuvastatin  10 mg Oral q1800  . sodium chloride flush  3 mL Intravenous Q12H     Infusions: . sodium chloride    . ceFEPime (MAXIPIME) IV    . vancomycin       PRN Medications:  sodium chloride, acetaminophen, ondansetron **OR** ondansetron (ZOFRAN) IV, senna-docusate, sodium chloride flush, zolpidem    Patient Profile   Dillon Andrews is a 77 y.o. male with a hx of chronic combined CHF, NICM, tricuspid regurgitation, mitral regurgitation, CKD III,presumedNASH(per pt, never proven),esophageal varices per chart(prior glass of wine occasionally but no heavy alcohol),HTN, HLD, DM, Barrett's esophagus, GERD, anemia, RBBB, and cardiac amyloidosis.  Admitted 07/10/17 with fever and productive cough.    Assessment/Plan   1. Fever/Productive cough - THM to admit. Covering for HCAP with Vanc/Cefepime.  - Tmax 100.1 this am. Still has productive cough.   2. Acute on chronic combined HF. NICM by cath at [facility] 6/2018with EF 40-45%. Echo 06/2017: EF 25-30%,  mild LAE, mild/mod RV dilation, mod/severe RA. PYP scan strongly suggestive of TTR amyloid, SPEP with 0.8% m-spike, UPEP with Bence Jones Protein positive, kappa type - Volume status remains elevated on exam.  - Increase lasix to 80 mg IV BID. Takes torsemide 40 mg BID at home. (recent change) -Continue coreg 6.25 mg BID. - Continue spiro 12.5 mg daily - No ARB/entresto with CKD - Genetic testing sent.  - Refer to Dr Posey Pronto with neurology  3. CKD III, baseline ~1.6 - Creatinine 1.98 > 2.03 - Daily BMET  4. Hx ofmoderatemitral regurgitation on [facility] TEE 09/2016 - No mention of MR on more recent Echo. No change.   5. Recurrent bilateral effusions - R>L on CXR. Follow. No change.  6. NSVT - Keep K >4.0, Mag >2.0 - K 4.0, will add on mag.   Medication concerns reviewed with patient and pharmacy team. Barriers identified: none  Length of Stay: [place], NP  07/11/2017, 8:58 AM  Advanced Heart Failure Team Pager [phone] (M-F; 7a - 4p)  Please contact [facility] for night-coverage after hours (4p -7a ) and weekends on [email or web address]  Patient seen and examined with the above-signed Advanced Practice Provider and/or Housestaff. I personally reviewed laboratory data, imaging studies and relevant notes. I independently examined the patient and formulated the important aspects of the plan. I have edited the note to reflect any of my changes or salient points. I have personally discussed the plan with the patient and/or family.  Suspect major issue is recurrent HF in setting of TTR amyloidosis. Creatinine up minimally. Will continue IV diuresis. Also appears to have acute URI. CXR viewed personally and no evidence of infiltrate/PNA. Initial PCT was low.   Continue IV diuresis and abx. Await results of genetic testing for TTR.  Glori Bickers, MD  3:21 PM

## 2017-07-11 NOTE — Progress Notes (Signed)
Pt had a 8 bt run of NSVT, no s/s, hx of NSVT, MD notified, will continue to monitor, Thanks Arvella Nigh RN.

## 2017-07-12 ENCOUNTER — Other Ambulatory Visit: Payer: Self-pay

## 2017-07-12 LAB — BASIC METABOLIC PANEL
Anion gap: 13 (ref 5–15)
BUN: 38 mg/dL — ABNORMAL HIGH (ref 6–20)
CHLORIDE: 97 mmol/L — AB (ref 101–111)
CO2: 31 mmol/L (ref 22–32)
Calcium: 8.8 mg/dL — ABNORMAL LOW (ref 8.9–10.3)
Creatinine, Ser: 2.1 mg/dL — ABNORMAL HIGH (ref 0.61–1.24)
GFR calc Af Amer: 33 mL/min — ABNORMAL LOW (ref >60)
GFR, EST NON AFRICAN AMERICAN: 29 mL/min — AB (ref >60)
GLUCOSE: 134 mg/dL — AB (ref 65–99)
POTASSIUM: 3.4 mmol/L — AB (ref 3.5–5.1)
Sodium: 141 mmol/L (ref 135–145)

## 2017-07-12 LAB — CULTURE, RESPIRATORY W GRAM STAIN: Culture: NORMAL

## 2017-07-12 LAB — GLUCOSE, CAPILLARY
GLUCOSE-CAPILLARY: 121 mg/dL — AB (ref 65–99)
GLUCOSE-CAPILLARY: 127 mg/dL — AB (ref 65–99)
Glucose-Capillary: 130 mg/dL — ABNORMAL HIGH (ref 65–99)
Glucose-Capillary: 134 mg/dL — ABNORMAL HIGH (ref 65–99)

## 2017-07-12 MED ORDER — DOXYCYCLINE HYCLATE 100 MG PO TABS
100.0000 mg | ORAL_TABLET | Freq: Two times a day (BID) | ORAL | Status: DC
  Administered 2017-07-12 – 2017-07-13 (×2): 100 mg via ORAL
  Filled 2017-07-12 (×2): qty 1

## 2017-07-12 MED ORDER — POTASSIUM CHLORIDE CRYS ER 20 MEQ PO TBCR
40.0000 meq | EXTENDED_RELEASE_TABLET | Freq: Once | ORAL | Status: AC
  Administered 2017-07-12: 40 meq via ORAL
  Filled 2017-07-12: qty 2

## 2017-07-12 MED ORDER — TRAZODONE HCL 50 MG PO TABS
50.0000 mg | ORAL_TABLET | Freq: Every day | ORAL | Status: DC
  Administered 2017-07-12: 50 mg via ORAL
  Filled 2017-07-12: qty 1

## 2017-07-12 MED ORDER — MAGNESIUM SULFATE 2 GM/50ML IV SOLN
2.0000 g | Freq: Once | INTRAVENOUS | Status: AC
  Administered 2017-07-12: 2 g via INTRAVENOUS
  Filled 2017-07-12: qty 50

## 2017-07-12 NOTE — Progress Notes (Signed)
Advanced Heart Failure Rounding Note  PCP-Cardiologist: Glenetta Hew, MD   Subjective:    Diuresed well. Weight down 3 pounds. (6 pounds total). Cough improved. Urine output now slowing down. Beathing better. Tm 99.2. Creatinine up slightly to 2.1   Objective:   Weight Range: 82.3 kg (181 lb 6.4 oz) Body mass index is 25.3 kg/m.   Vital Signs:   Temp:  [97.9 F (36.6 C)-99.2 F (37.3 C)] 97.9 F (36.6 C) (03/30 0030) Pulse Rate:  [70-83] 70 (03/30 0801) Resp:  [16-21] 16 (03/30 0030) BP: (104-114)/(59-85) 104/85 (03/30 0801) SpO2:  [93 %-95 %] 93 % (03/30 0030) Weight:  [82.3 kg (181 lb 6.4 oz)] 82.3 kg (181 lb 6.4 oz) (03/30 0300) Last BM Date: 07/10/17  Weight change: Filed Weights   07/10/17 1727 07/11/17 0558 07/12/17 0300  Weight: 85.3 kg (188 lb 1 oz) 83.5 kg (184 lb 1.6 oz) 82.3 kg (181 lb 6.4 oz)    Intake/Output:   Intake/Output Summary (Last 24 hours) at 07/12/2017 1402 Last data filed at 07/12/2017 0906 Gross per 24 hour  Intake 540 ml  Output 2075 ml  Net -1535 ml      Physical Exam    General:  Sitting up in bed. Elderly. No resp difficulty HEENT: normal Neck: supple. JVP 6-7  Carotids 2+ bilat; no bruits. No lymphadenopathy or thryomegaly appreciated. Cor: PMI nondisplaced. Regular rate & rhythm. No rubs, gallops or murmurs. Lungs: clear Abdomen: soft, nontender, nondistended. No hepatosplenomegaly. No bruits or masses. Good bowel sounds. Extremities: no cyanosis, clubbing, rash, edema Neuro: alert & orientedx3, cranial nerves grossly intact. moves all 4 extremities w/o difficulty. Affect pleasant   Telemetry   SR 80's with IVCD and frequent PVC's (averaging 3-6/min). 9 beat run of NSVT this am.   EKG    No new tracings.   Labs    CBC Recent Labs    07/10/17 1231 07/11/17 0544  WBC 8.8 9.1  NEUTROABS 6.2  --   HGB 12.3* 12.1*  HCT 39.9 39.1  MCV 87.3 87.5  PLT 200 938   Basic Metabolic Panel Recent Labs     07/11/17 0544 07/12/17 0147  NA 138 141  K 4.0 3.4*  CL 96* 97*  CO2 26 31  GLUCOSE 107* 134*  BUN 33* 38*  CREATININE 2.03* 2.10*  CALCIUM 8.7* 8.8*  MG 1.8  --    Liver Function Tests Recent Labs    07/10/17 1231  AST 40  ALT 30  ALKPHOS 244*  BILITOT 1.6*  PROT 7.6  ALBUMIN 3.7   No results for input(s): LIPASE, AMYLASE in the last 72 hours. Cardiac Enzymes No results for input(s): CKTOTAL, CKMB, CKMBINDEX, TROPONINI in the last 72 hours.  BNP: BNP (last 3 results) Recent Labs    11/19/16 1326 03/27/17 1215 06/25/17 1207  BNP 439.5* 489.5* 420.9*    ProBNP (last 3 results) No results for input(s): PROBNP in the last 8760 hours.   D-Dimer No results for input(s): DDIMER in the last 72 hours. Hemoglobin A1C Recent Labs    07/10/17 1231  HGBA1C 7.0*   Fasting Lipid Panel No results for input(s): CHOL, HDL, LDLCALC, TRIG, CHOLHDL, LDLDIRECT in the last 72 hours. Thyroid Function Tests No results for input(s): TSH, T4TOTAL, T3FREE, THYROIDAB in the last 72 hours.  Invalid input(s): FREET3  Other results:   Imaging    No results found.   Medications:     Scheduled Medications: . aspirin EC  81 mg Oral  Daily  . carvedilol  6.25 mg Oral BID WC  . cyanocobalamin  500 mcg Oral QODAY  . furosemide  80 mg Intravenous BID  . gabapentin  800 mg Oral QHS  . heparin injection (subcutaneous)  5,000 Units Subcutaneous Q8H  . insulin aspart  0-5 Units Subcutaneous QHS  . insulin aspart  0-9 Units Subcutaneous TID WC  . pantoprazole  40 mg Oral Daily  . rosuvastatin  10 mg Oral q1800  . sodium chloride flush  3 mL Intravenous Q12H  . spironolactone  12.5 mg Oral Daily  . traZODone  50 mg Oral QHS    Infusions: . sodium chloride    . ceFEPime (MAXIPIME) IV Stopped (07/12/17 1330)    PRN Medications: sodium chloride, acetaminophen, albuterol, ondansetron **OR** ondansetron (ZOFRAN) IV, senna-docusate, sodium chloride flush,  zolpidem    Patient Profile   Dillon Andrews is a 77 y.o. male with a hx of chronic combined CHF, NICM, tricuspid regurgitation, mitral regurgitation, CKD III,presumedNASH(per pt, never proven),esophageal varices per chart(prior glass of wine occasionally but no heavy alcohol),HTN, HLD, DM, Barrett's esophagus, GERD, anemia, RBBB, and cardiac amyloidosis.  Admitted 07/10/17 with fever and productive cough.    Assessment/Plan   1. Fever/Productive cough with acute hypoxic respiratory failure -Suspect major issue is recurrent HF in setting of TTR amyloidosis but also appears to have acute URI. CXR viewed personally and no evidence of infiltrate/PNA. Initial PCT was low.  - [facility] Covering for HCAP with Cefepime.  - Tmax 99.2 this am. Still has productive cough.   2. Acute on chronic combined HF. NICM by cath at [facility] 6/2018with EF 40-45%. Echo 06/2017: EF 25-30%, mild LAE, mild/mod RV dilation, mod/severe RA. PYP scan strongly suggestive of TTR amyloid (Grade 2), SPEP with 0.8% m-spike, IFE with Bence Jones Protein positive, kappa type. Total protein 7.6 Albumin 3.7 - Seems to have TTR amyloid but cannot completely exclude AL amyloid. Will d/w Dr. Riki Sheer - Volume status now at baseline. Stop IV lasix Takes torsemide 40 mg BID at home. (recent change). Restart soon -Continue coreg 6.25 mg BID. - Continue spiro 12.5 mg daily - No ARB/entresto with CKD - Genetic testing sent.  - Refer to Dr Posey Pronto with neurology  3. Acute on  CKD III, baseline ~1.6 - Creatinine slightly worsened with diuresis 1.98 > 2.03 > 2.10 - Daily BMET - Hold lasix  5. Recurrent bilateral effusions - R>L on CXR. Follow. No change.   5. NSVT - Keep K >4.0, Mag >2.0 - K 3.4 Mag 1.8 -> will supp   Medication concerns reviewed with patient and pharmacy team. Barriers identified: none  Length of Stay: 2  Glori Bickers, MD  07/12/2017, 2:02 PM  Advanced Heart Failure Team Pager [phone]  (M-F; 7a - 4p)  Please contact [facility] for night-coverage after hours (4p -7a ) and weekends on [email or web address]

## 2017-07-12 NOTE — Progress Notes (Signed)
[facility] Progress Note  Patient: Dillon Andrews SVX:[id number]   PCP: [name], MD DOB: 29-Oct-1940   DOA: 07/10/2017   DOS: 07/12/2017   Date of Service: the patient was seen and examined on 07/12/2017  Subjective: Feeling better.  Confused last night after receiving Remeron.  Unable to sleep.  Did not receive Ambien.  Brief hospital course: Pt. with PMH of  DM, NICM, NAFLD, HTN, HLD, esophageal varices, stage 3 CKD, and chronic combined CHF; admitted on 07/10/2017, presented with complaint of shortnes of breath, was found to have viral pneumonitis as well as acute on chronic combined CHF. Currently further plan is continue IV diuresis.  Assessment and Plan: 1.  Acute bronchitis.  Sepsis ruled out.  No pneumonia. Presented with complaints of cough and shortness of breath.  Had fever and tachycardia on admission. Lactic acid was normal, blood pressure is stable. Pro calcitonin level negative. Influenza PCR negative as well. Blood cultures were performed.  Cultures are negative for 48 hours And patient was admitted for probable sepsis started on IV vancomycin and cefepime. Currently MRSA PCR Due to presence of low-grade fever continue antibiotics for a total of 5 days.  Change to oral doxycycline.  2.  Acute on chronic combined CHF. NICM Amyloid deposition disorder. Cardiac catheterization was negative. Recently EF 25%. Diffuse hypokinesis. 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 Cardiology-CHF service consulted. Currently on Coreg,  Aldactone. IV Lasix changed to oral torsemide.  Monitor recommendation from cardiology. Monitoring and out and daily weight.  3.  Anxiety and insomnia. Patient requesting to be on benzodiazepine,  Patient had side effect of confusion with Remeron.  Will discontinue Remeron for now.  Add trazodone. Also on gabapentin which should help. Continue as needed Ambien.  4. CKD stage III. Renal function  stable. Monitor.  5.  Type 2 diabetes mellitus. Hemoglobin A1c 7.0. Suggest controlled. Continue sliding scale insulin.  Diet: Cardiac and carb modified diet DVT Prophylaxis: subcutaneous Heparin  Advance goals of care discussion: full code  Family Communication: family was present at bedside, at the time of interview. The pt provided permission to discuss medical plan with the family. Opportunity was given to ask question and all questions were answered satisfactorily.   Disposition:  Discharge to home.  Consultants: cardiology Procedures: Echocardiogram   Antibiotics: Anti-infectives (From admission, onward)   Start     Dose/Rate Route Frequency Ordered Stop   07/12/17 2200  doxycycline (VIBRA-TABS) tablet 100 mg     100 mg Oral Every 12 hours 07/12/17 1558     07/11/17 1300  ceFEPIme (MAXIPIME) 1 g in sodium chloride 0.9 % 100 mL IVPB  Status:  Discontinued     1 g 200 mL/hr over 30 Minutes Intravenous Every 24 hours 07/10/17 1327 07/12/17 1558   07/11/17 1000  vancomycin (VANCOCIN) 1,250 mg in sodium chloride 0.9 % 250 mL IVPB  Status:  Discontinued     1,250 mg 166.7 mL/hr over 90 Minutes Intravenous Every 24 hours 07/10/17 1314 07/11/17 1438   07/10/17 1315  ceFEPIme (MAXIPIME) 1 g in sodium chloride 0.9 % 100 mL IVPB     1 g 200 mL/hr over 30 Minutes Intravenous  Once 07/10/17 1306 07/10/17 1441   07/10/17 1315  vancomycin (VANCOCIN) 1,500 mg in sodium chloride 0.9 % 500 mL IVPB     1,500 mg 250 mL/hr over 120 Minutes Intravenous  Once 07/10/17 1314 07/10/17 1610       Objective: Physical Exam: Vitals:  07/11/17 2330 07/12/17 0030 07/12/17 0300 07/12/17 0801  BP: 111/74 105/71  104/85  Pulse: 83 76  70  Resp: 17 16    Temp: 98.9 F (37.2 C) 97.9 F (36.6 C)    TempSrc: Oral Oral    SpO2: 94% 93%    Weight:   82.3 kg (181 lb 6.4 oz)   Height:        Intake/Output Summary (Last 24 hours) at 07/12/2017 1558 Last data filed at 07/12/2017 1500 Gross per 24  hour  Intake 640 ml  Output 1975 ml  Net -1335 ml   Filed Weights   07/10/17 1727 07/11/17 0558 07/12/17 0300  Weight: 85.3 kg (188 lb 1 oz) 83.5 kg (184 lb 1.6 oz) 82.3 kg (181 lb 6.4 oz)   General: Alert, Awake and Oriented to Time, Place and Person. Appear in mild distress, affect appropriate Eyes: PERRL, Conjunctiva normal ENT: Oral Mucosa clear moist. Neck: no JVD, no Abnormal Mass Or lumps Cardiovascular: S1 and S2 Present, no Murmur, Peripheral Pulses Present Respiratory: normal respiratory effort, Bilateral Air entry equal and Decreased, no use of accessory muscle, Clear to Auscultation, no Crackles, no wheezes Abdomen: Bowel Sound present, Soft and no tenderness, no hernia Skin: no redness, no Rash, no induration Extremities: no Pedal edema, no calf tenderness Neurologic: Grossly no focal neuro deficit. Bilaterally Equal motor strength  Data Reviewed: CBC: Recent Labs  Lab 07/10/17 1231 07/11/17 0544  WBC 8.8 9.1  NEUTROABS 6.2  --   HGB 12.3* 12.1*  HCT 39.9 39.1  MCV 87.3 87.5  PLT 200 741   Basic Metabolic Panel: Recent Labs  Lab 07/10/17 1231 07/11/17 0544 07/12/17 0147  NA 140 138 141  K 3.5 4.0 3.4*  CL 94* 96* 97*  CO2 31 26 31   GLUCOSE 68 107* 134*  BUN 33* 33* 38*  CREATININE 1.98* 2.03* 2.10*  CALCIUM 9.1 8.7* 8.8*  MG  --  1.8  --     Liver Function Tests: Recent Labs  Lab 07/10/17 1231  AST 40  ALT 30  ALKPHOS 244*  BILITOT 1.6*  PROT 7.6  ALBUMIN 3.7   No results for input(s): LIPASE, AMYLASE in the last 168 hours. No results for input(s): AMMONIA in the last 168 hours. Coagulation Profile: Recent Labs  Lab 07/10/17 1231  INR 1.28   Cardiac Enzymes: No results for input(s): CKTOTAL, CKMB, CKMBINDEX, TROPONINI in the last 168 hours. BNP (last 3 results) No results for input(s): PROBNP in the last 8760 hours. CBG: Recent Labs  Lab 07/11/17 1204 07/11/17 1738 07/11/17 2051 07/12/17 0736 07/12/17 1229  GLUCAP 120* 129*  127* 121* 130*   Studies: No results found.  Scheduled Meds: . aspirin EC  81 mg Oral Daily  . carvedilol  6.25 mg Oral BID WC  . cyanocobalamin  500 mcg Oral QODAY  . doxycycline  100 mg Oral Q12H  . gabapentin  800 mg Oral QHS  . heparin injection (subcutaneous)  5,000 Units Subcutaneous Q8H  . insulin aspart  0-5 Units Subcutaneous QHS  . insulin aspart  0-9 Units Subcutaneous TID WC  . pantoprazole  40 mg Oral Daily  . rosuvastatin  10 mg Oral q1800  . sodium chloride flush  3 mL Intravenous Q12H  . spironolactone  12.5 mg Oral Daily  . traZODone  50 mg Oral QHS   Continuous Infusions: . sodium chloride    . magnesium sulfate 1 - 4 g bolus IVPB 2 g (07/12/17 1522)  PRN Meds: sodium chloride, acetaminophen, albuterol, ondansetron **OR** ondansetron (ZOFRAN) IV, senna-docusate, sodium chloride flush, zolpidem  Time spent: 35 minutes  Author: Berle Mull, MD [facility] Pager: [phone] 07/12/2017 3:58 PM  If 7PM-7AM, please contact night-coverage at [email or web address], password [facility]

## 2017-07-13 LAB — GLUCOSE, CAPILLARY
GLUCOSE-CAPILLARY: 108 mg/dL — AB (ref 65–99)
Glucose-Capillary: 158 mg/dL — ABNORMAL HIGH (ref 65–99)

## 2017-07-13 LAB — BASIC METABOLIC PANEL
Anion gap: 12 (ref 5–15)
BUN: 40 mg/dL — AB (ref 6–20)
CHLORIDE: 98 mmol/L — AB (ref 101–111)
CO2: 30 mmol/L (ref 22–32)
CREATININE: 1.9 mg/dL — AB (ref 0.61–1.24)
Calcium: 8.8 mg/dL — ABNORMAL LOW (ref 8.9–10.3)
GFR calc Af Amer: 38 mL/min — ABNORMAL LOW (ref >60)
GFR calc non Af Amer: 32 mL/min — ABNORMAL LOW (ref >60)
Glucose, Bld: 104 mg/dL — ABNORMAL HIGH (ref 65–99)
Potassium: 3.3 mmol/L — ABNORMAL LOW (ref 3.5–5.1)
SODIUM: 140 mmol/L (ref 135–145)

## 2017-07-13 MED ORDER — GUAIFENESIN ER 600 MG PO TB12
600.0000 mg | ORAL_TABLET | Freq: Two times a day (BID) | ORAL | Status: DC
  Administered 2017-07-13: 600 mg via ORAL
  Filled 2017-07-13: qty 1

## 2017-07-13 MED ORDER — TRAZODONE HCL 50 MG PO TABS: 50.0000 mg | ORAL_TABLET | Freq: Every day | ORAL | 0 refills | Status: DC

## 2017-07-13 MED ORDER — ZOLPIDEM TARTRATE 5 MG PO TABS: 5.0000 mg | ORAL_TABLET | Freq: Every evening | ORAL | 0 refills | Status: DC | PRN

## 2017-07-13 MED ORDER — DOXYCYCLINE HYCLATE 100 MG PO TABS: 100.0000 mg | ORAL_TABLET | Freq: Two times a day (BID) | ORAL | 0 refills | Status: AC

## 2017-07-13 MED ORDER — GUAIFENESIN ER 600 MG PO TB12: 600.0000 mg | ORAL_TABLET | Freq: Two times a day (BID) | ORAL | 0 refills | Status: DC

## 2017-07-13 MED ORDER — POTASSIUM CHLORIDE CRYS ER 20 MEQ PO TBCR
40.0000 meq | EXTENDED_RELEASE_TABLET | ORAL | Status: AC
  Administered 2017-07-13 (×2): 40 meq via ORAL
  Filled 2017-07-13 (×2): qty 2

## 2017-07-13 NOTE — Plan of Care (Signed)
  Problem: Education: Goal: Knowledge of General Education information will improve Outcome: Progressing   Problem: Activity: Goal: Risk for activity intolerance will decrease Outcome: Progressing   Problem: Education: Goal: Ability to demonstrate management of disease process will improve Outcome: Progressing   Problem: Activity: Goal: Capacity to carry out activities will improve Outcome: Progressing   Problem: Cardiac: Goal: Ability to achieve and maintain adequate cardiopulmonary perfusion will improve Outcome: Progressing

## 2017-07-13 NOTE — Progress Notes (Signed)
Pt discharged home, IV and tele removed discharge instructions given to pt and daughter. No further needs. Taken out via wheelchair.

## 2017-07-13 NOTE — Progress Notes (Signed)
Advanced Heart Failure Rounding Note  PCP-Cardiologist: Glenetta Hew, MD   Subjective:    Diuretics held yesterday due to AKI. Creatinine improved. Feeling better. URI improving. No CP or SOB  Objective:   Weight Range: 82.9 kg (182 lb 12.8 oz) Body mass index is 25.5 kg/m.   Vital Signs:   Temp:  [97.4 F (36.3 C)-98 F (36.7 C)] 97.4 F (36.3 C) (03/31 0446) Pulse Rate:  [70-76] 76 (03/31 0446) Resp:  [18] 18 (03/31 0446) BP: (103-118)/(62-64) 118/64 (03/31 0446) SpO2:  [94 %-96 %] 96 % (03/31 0446) Weight:  [82.9 kg (182 lb 12.8 oz)] 82.9 kg (182 lb 12.8 oz) (03/31 0446) Last BM Date: 07/12/17  Weight change: Filed Weights   07/11/17 0558 07/12/17 0300 07/13/17 0446  Weight: 83.5 kg (184 lb 1.6 oz) 82.3 kg (181 lb 6.4 oz) 82.9 kg (182 lb 12.8 oz)    Intake/Output:   Intake/Output Summary (Last 24 hours) at 07/13/2017 1135 Last data filed at 07/13/2017 0450 Gross per 24 hour  Intake 750 ml  Output 960 ml  Net -210 ml      Physical Exam    General:  Sitting up in chair. No resp difficulty HEENT: normal Neck: supple. JVP 7-8. Carotids 2+ bilat; no bruits. No lymphadenopathy or thryomegaly appreciated. Cor: PMI nondisplaced. Regular rate & rhythm. 2/6 TR Lungs: clear Abdomen: soft, nontender, nondistended. No hepatosplenomegaly. No bruits or masses. Good bowel sounds. Extremities: no cyanosis, clubbing, rash, edema Neuro: alert & orientedx3, cranial nerves grossly intact. moves all 4 extremities w/o difficulty. Affect pleasant   Telemetry   SR 70-80s with IVCD with PVC's Personally reviewed   EKG    No new tracings.   Labs    CBC Recent Labs    07/10/17 1231 07/11/17 0544  WBC 8.8 9.1  NEUTROABS 6.2  --   HGB 12.3* 12.1*  HCT 39.9 39.1  MCV 87.3 87.5  PLT 200 488   Basic Metabolic Panel Recent Labs    07/11/17 0544 07/12/17 0147 07/13/17 0416  NA 138 141 140  K 4.0 3.4* 3.3*  CL 96* 97* 98*  CO2 26 31 30   GLUCOSE 107* 134*  104*  BUN 33* 38* 40*  CREATININE 2.03* 2.10* 1.90*  CALCIUM 8.7* 8.8* 8.8*  MG 1.8  --   --    Liver Function Tests Recent Labs    07/10/17 1231  AST 40  ALT 30  ALKPHOS 244*  BILITOT 1.6*  PROT 7.6  ALBUMIN 3.7   No results for input(s): LIPASE, AMYLASE in the last 72 hours. Cardiac Enzymes No results for input(s): CKTOTAL, CKMB, CKMBINDEX, TROPONINI in the last 72 hours.  BNP: BNP (last 3 results) Recent Labs    11/19/16 1326 03/27/17 1215 06/25/17 1207  BNP 439.5* 489.5* 420.9*    ProBNP (last 3 results) No results for input(s): PROBNP in the last 8760 hours.   D-Dimer No results for input(s): DDIMER in the last 72 hours. Hemoglobin A1C Recent Labs    07/10/17 1231  HGBA1C 7.0*   Fasting Lipid Panel No results for input(s): CHOL, HDL, LDLCALC, TRIG, CHOLHDL, LDLDIRECT in the last 72 hours. Thyroid Function Tests No results for input(s): TSH, T4TOTAL, T3FREE, THYROIDAB in the last 72 hours.  Invalid input(s): FREET3  Other results:   Imaging    No results found.   Medications:     Scheduled Medications: . aspirin EC  81 mg Oral Daily  . carvedilol  6.25 mg Oral BID WC  .  cyanocobalamin  500 mcg Oral QODAY  . doxycycline  100 mg Oral Q12H  . gabapentin  800 mg Oral QHS  . guaiFENesin  600 mg Oral BID  . heparin injection (subcutaneous)  5,000 Units Subcutaneous Q8H  . insulin aspart  0-5 Units Subcutaneous QHS  . insulin aspart  0-9 Units Subcutaneous TID WC  . pantoprazole  40 mg Oral Daily  . potassium chloride  40 mEq Oral Q2H  . rosuvastatin  10 mg Oral q1800  . sodium chloride flush  3 mL Intravenous Q12H  . spironolactone  12.5 mg Oral Daily  . traZODone  50 mg Oral QHS    Infusions: . sodium chloride      PRN Medications: sodium chloride, acetaminophen, albuterol, ondansetron **OR** ondansetron (ZOFRAN) IV, senna-docusate, sodium chloride flush, zolpidem    Patient Profile   Dillon Andrews is a 77 y.o. male with a  hx of chronic combined CHF, NICM, tricuspid regurgitation, mitral regurgitation, CKD III,presumedNASH(per pt, never proven),esophageal varices per chart(prior glass of wine occasionally but no heavy alcohol),HTN, HLD, DM, Barrett's esophagus, GERD, anemia, RBBB, and cardiac amyloidosis.  Admitted 07/10/17 with fever and productive cough.    Assessment/Plan   1. Fever/Productive cough with acute hypoxic respiratory failure -Suspect major issue is recurrent HF in setting of TTR amyloidosis but also appears to have acute URI. CXR viewed personally and no evidence of infiltrate/PNA. Initial PCT was low.  - [facility] for HCAP with Cefepime.  - Now afebrile  2. Acute on chronic combined HF. NICM by cath at [facility] 6/2018with EF 40-45%. Echo 06/2017: EF 25-30%, mild LAE, mild/mod RV dilation, mod/severe RA. PYP scan strongly suggestive of TTR amyloid (Grade 2), SPEP with 0.8% m-spike, IFE with Bence Jones Protein positive, kappa type. Total protein 7.6 Albumin 3.7 - Seems to have TTR amyloid but cannot completely exclude AL amyloid. Will d/w Dr. Riki Sheer - Volume status improved. Can go home today on torsemide 40 mg BID at home. -Continue coreg 6.25 mg BID. - Continue spiro 12.5 mg daily - No ARB/entresto with CKD - Genetic testing sent.  - Refer to Dr Posey Pronto with neurology  3. Acute on  CKD III, baseline ~1.6 - Creatinine improved today 1.98 > 2.03 > 2.10 > 1.9 (now at baseline)   5. Recurrent bilateral effusions - R>L on CXR. Follow. No change.   5. NSVT - Keep K >4.0, Mag >2.0 - K 3.3 will supp prior to d/c  Home meds:  Coreg 6.25 bid Torsemide 40 bid (can increase to 60 bid if weight going up. Can decrease to 20 bid if weight going down)  Spiro 12.5 Kcl 20 bid crestor 10 daily  ecasa 81  Will f/u in HF Clinic  Length of Stay: Fowler, MD  07/13/2017, 11:35 AM  Advanced Heart Failure Team Pager [phone] (M-F; 7a - 4p)  Please contact [place]  [facility] for night-coverage after hours (4p -7a ) and weekends on [email or web address]

## 2017-07-14 ENCOUNTER — Other Ambulatory Visit: Payer: Self-pay

## 2017-07-14 NOTE — Patient Outreach (Signed)
[place] [facility]) Care Management  07/14/2017  Javious W Aken 1940-07-09 [id number]   EMMI- Pneumonia RED ON EMMI ALERT Day # 1 Date: 07/12/17 Red Alert Reason:  Scheduled Follow up appointment? No  Outreach attempt # 1 spoke with patient's wife.  She is able to verify HIPAA. She reports that patient is doing ok but is weak and tired.  She states he slept good last night and that he is eating good.  Addressed red alert with her.  She states that patient has several appointments.  She named appointments with cardiology.  No appointments with primary care physician.  Advised her that patient needed to follow up with his primary care physician.   She verbalized understanding and states she will call them this morning. Wife asked if giving patient his evening fluid pills earlier would be ok as patient has to gt up several times during the night. Advised her to call Dr. Clayborne Dana office for clarification on when to give fluid pill.  She verbalized understanding and will call them today.     Plan: RN CM will close case at this time as no further needs.    Jone Baseman, RN, MSN [facility] Care Management Care Management Coordinator Direct Line [phone] Toll Free: [phone]  Fax: [phone]

## 2017-07-15 LAB — CULTURE, BLOOD (ROUTINE X 2)
Culture: NO GROWTH
Culture: NO GROWTH
SPECIAL REQUESTS: ADEQUATE
SPECIAL REQUESTS: ADEQUATE

## 2017-07-17 ENCOUNTER — Other Ambulatory Visit: Payer: Self-pay | Admitting: *Deleted

## 2017-07-17 ENCOUNTER — Other Ambulatory Visit: Payer: Self-pay

## 2017-07-17 DIAGNOSIS — R200 Anesthesia of skin: Secondary | ICD-10-CM

## 2017-07-17 NOTE — Patient Outreach (Signed)
[place] [facility]) Care Management  07/17/2017  Dillon Andrews 1940/09/20 [id number]   EMMI: pneumonia red alert Referral date: 07/17/17 Referral reason:  Swelling in hands/ feet or changes in weight? Yes,  Fever / chills? Yes Day # 4 and 5  Telephone call to patient regarding EMMI pneumonia red alert. HIPAA verified with patient. Discussed reason for call with patient. Patient reports he is not having any swelling, fever, or chills. Patient states he is doing ok. States he is still coughing up phlegm.  Patient states, " my ankles look good and I lost a pound yesterday."  Patient states he has a follow up appointment with his primary MD on tomorrow, a follow up with Dr. [name] on 07/23/17 and a follow up with the neurologist on 07/24/17.  Patient denies having any new symptoms at this time.  RNCM advised patient to notify MD of any changes in condition prior to scheduled appointment. RNCM verified patient aware of 911 services for urgent/ emergent needs. Advised to continue to monitor weight and take medications as prescribed.  RNCM informed patient he would continue to receive EMMI pneumonia calls. Patient requested calls be discontinued.   PLAN: RNCM will close patient due to patient being assessed and having no further needs.  RNCM will send notification to care management assistant to discontinue EMMI pneumonia calls.   Quinn Plowman RN,BSN,CCM [facility] Telephonic  [phone]

## 2017-07-17 NOTE — Patient Outreach (Signed)
Patient triggered Red on Emmi Pneumonia Dashboard, notification sent to Quinn Plowman, RN

## 2017-07-17 NOTE — Progress Notes (Unsigned)
em

## 2017-07-18 ENCOUNTER — Encounter: Payer: Self-pay | Admitting: Family Medicine

## 2017-07-18 ENCOUNTER — Ambulatory Visit (INDEPENDENT_AMBULATORY_CARE_PROVIDER_SITE_OTHER): Payer: Medicare HMO | Admitting: Family Medicine

## 2017-07-18 VITALS — BP 118/70 | HR 70 | Ht 71.0 in | Wt 179.5 lb

## 2017-07-18 DIAGNOSIS — I43 Cardiomyopathy in diseases classified elsewhere: Secondary | ICD-10-CM | POA: Diagnosis not present

## 2017-07-18 DIAGNOSIS — N183 Chronic kidney disease, stage 3 unspecified: Secondary | ICD-10-CM

## 2017-07-18 DIAGNOSIS — R0602 Shortness of breath: Secondary | ICD-10-CM | POA: Diagnosis not present

## 2017-07-18 DIAGNOSIS — I5042 Chronic combined systolic (congestive) and diastolic (congestive) heart failure: Secondary | ICD-10-CM

## 2017-07-18 DIAGNOSIS — E854 Organ-limited amyloidosis: Secondary | ICD-10-CM | POA: Insufficient documentation

## 2017-07-18 DIAGNOSIS — I428 Other cardiomyopathies: Secondary | ICD-10-CM | POA: Diagnosis not present

## 2017-07-18 DIAGNOSIS — Z09 Encounter for follow-up examination after completed treatment for conditions other than malignant neoplasm: Secondary | ICD-10-CM | POA: Diagnosis not present

## 2017-07-18 DIAGNOSIS — G4701 Insomnia due to medical condition: Secondary | ICD-10-CM | POA: Diagnosis not present

## 2017-07-18 NOTE — Progress Notes (Signed)
Subjective:  Patient ID: Dillon Andrews, male    DOB: March 11, 1941  Age: 77 y.o. MRN: [id number]  CC: Hospitalization Follow-up   HPI [name] presents for her hospitalization discharge follow-up.  He was treated for community-acquired pneumonia and congestive heart failure.  He is feeling better.  There is some lingering fatigue, shortness of breath and cough.  Reports scant phlegm production but no fever.  He was able to mow a couple of yards in his neighborhood on his zero turn radius mower.  Lasix was discontinued.  He is taking Demadex 40 mg twice daily.  He is taking his potassium twice a day with the Demadex.  He says that he is no longer taking Aldactone.  He is currently taking guaifenesin for his cough.  He says that he feels as though his call is controlled with the guaifenesin alone.  He is sleeping on a wedge pillow in his bed alternating with time spent in his upright recliner.  He reports good sleep with Ambien 5 mg and trazodone 50 mg.  He has lost 6 pounds over the last week or so.  Cardiology follow-up on Wednesday with neurology follow-up on Thursday.  He is accompanied today by his son [name]..  Outpatient Medications Prior to Visit  Medication Sig Dispense Refill  . aspirin EC 81 MG tablet Take 81 mg by mouth daily.    . carvedilol (COREG) 6.25 MG tablet Take 1 tablet (6.25 mg total) by mouth 2 (two) times daily with a meal. 180 tablet 0  . gabapentin (NEURONTIN) 800 MG tablet Take 1 tablet (800 mg total) by mouth at bedtime. 90 tablet 1  . glipiZIDE (GLUCOTROL) 5 MG tablet Take 0.5 tablets (2.5 mg total) by mouth 2 (two) times daily. 90 tablet 1  . glucose blood test strip Use as instructed 100 each 12  . guaiFENesin (MUCINEX) 600 MG 12 hr tablet Take 1 tablet (600 mg total) by mouth 2 (two) times daily. 30 tablet 0  . omeprazole (PRILOSEC) 40 MG capsule Take 40 mg by mouth daily.    . potassium chloride SA (K-DUR,KLOR-CON) 20 MEQ tablet Take 1 tablet (20 mEq total) by  mouth as directed. With every dose of Metolazone 10 tablet 0  . rosuvastatin (CRESTOR) 10 MG tablet Take 10 mg by mouth daily.    Marland Kitchen torsemide (DEMADEX) 20 MG tablet Take 2 tablets (40 mg total) by mouth 2 (two) times daily. 120 tablet 3  . traZODone (DESYREL) 50 MG tablet Take 1 tablet (50 mg total) by mouth at bedtime. 30 tablet 0  . vitamin B-12 (CYANOCOBALAMIN) 500 MCG tablet Take 500 mcg by mouth every other day.    . zolpidem (AMBIEN) 5 MG tablet Take 1 tablet (5 mg total) by mouth at bedtime as needed for sleep. 10 tablet 0  . spironolactone (ALDACTONE) 25 MG tablet Take 0.5 tablets (12.5 mg total) by mouth daily. (Patient not taking: Reported on 07/10/2017) 15 tablet 0   No facility-administered medications prior to visit.     ROS Review of Systems  Constitutional: Positive for fatigue. Negative for chills and fever.  HENT: Negative.   Eyes: Negative.   Respiratory: Positive for cough and shortness of breath. Negative for chest tightness and wheezing.   Cardiovascular: Negative for chest pain and leg swelling.  Gastrointestinal: Negative.   Endocrine: Negative for polyphagia and polyuria.  Genitourinary: Negative for difficulty urinating, hematuria and urgency.  Skin: Negative for pallor and rash.  Hematological: Bruises/bleeds easily.  Psychiatric/Behavioral: Negative.     Objective:  BP 118/70 (BP Location: Left Arm, Patient Position: Sitting, Cuff Size: Normal)   Pulse 70   Ht 5' 11"  (1.803 m)   Wt 179 lb 8 oz (81.4 kg)   SpO2 93%   BMI 25.04 kg/m   BP Readings from Last 3 Encounters:  07/18/17 118/70  07/13/17 109/72  07/08/17 120/70    Wt Readings from Last 3 Encounters:  07/18/17 179 lb 8 oz (81.4 kg)  07/13/17 182 lb 12.8 oz (82.9 kg)  07/08/17 193 lb 8 oz (87.8 kg)    Physical Exam  Constitutional: He is oriented to person, place, and time. He appears well-developed and well-nourished.  HENT:  Head: Normocephalic and atraumatic.  Right Ear: External  ear normal.  Left Ear: External ear normal.  Eyes: Pupils are equal, round, and reactive to light. Conjunctivae are normal. Right eye exhibits no discharge. Left eye exhibits no discharge. No scleral icterus.  Neck: Neck supple. No JVD present. No tracheal deviation present. No thyromegaly present.  Cardiovascular: Normal rate, regular rhythm and normal heart sounds.  Pulmonary/Chest: Effort normal and breath sounds normal. No stridor. No respiratory distress. He has no wheezes. He has no rales.  Abdominal: Bowel sounds are normal.  Neurological: He is alert and oriented to person, place, and time.  Skin: Skin is warm and dry.  Psychiatric: He has a normal mood and affect. His behavior is normal. Thought content normal.  Nursing note and vitals reviewed.   Lab Results  Component Value Date   WBC 9.1 07/11/2017   HGB 12.1 (L) 07/11/2017   HCT 39.1 07/11/2017   PLT 158 07/11/2017   GLUCOSE 104 (H) 07/13/2017   ALT 30 07/10/2017   AST 40 07/10/2017   NA 140 07/13/2017   K 3.3 (L) 07/13/2017   CL 98 (L) 07/13/2017   CREATININE 1.90 (H) 07/13/2017   BUN 40 (H) 07/13/2017   CO2 30 07/13/2017   TSH 2.456 06/26/2017   INR 1.28 07/10/2017   HGBA1C 7.0 (H) 07/10/2017    Dg Chest 2 View  Result Date: 07/10/2017 CLINICAL DATA:  Seven month history of shortness of breath, 1 week of productive cough, fever to greater than 102 degrees today. History of CHF, previous episodes of pneumonia, former smoker. EXAM: CHEST - 2 VIEW COMPARISON:  PA and lateral chest x-ray of June 29, 2017 FINDINGS: The lungs are adequately inflated. There are bilateral pleural effusions which appears stable on the left and slightly larger on the right. There is no alveolar infiltrate. There is linear increased density at the left lung base that is stable. The heart and pulmonary vascularity are normal. The mediastinum is normal in width. There is calcification in the wall of the aortic arch. There is calcification of  portions of the anterior longitudinal ligament of the thoracic spine. IMPRESSION: Bibasilar atelectasis with small pleural effusions not greatly changed from the previous study though the right pleural effusion is slightly more conspicuous. No pneumonia or overt pulmonary edema. Thoracic aortic atherosclerosis. Electronically Signed   By: David  Martinique M.D.   On: 07/10/2017 12:08    Assessment & Plan:   Hoang was seen today for hospitalization follow-up.  Diagnoses and all orders for this visit:  Chronic combined systolic and diastolic heart failure (HCC)  Non-ischemic cardiomyopathy (HCC)  CKD (chronic kidney disease) stage 3, GFR 30-59 ml/min (HCC)  SOB (shortness of breath)  Insomnia due to medical condition  Cardiac amyloidosis [facility])  Hospital discharge  follow-up   I have discontinued Shaunte Weissinger. Lyons's spironolactone. I am also having him maintain his aspirin EC, omeprazole, vitamin B-12, rosuvastatin, gabapentin, glucose blood, carvedilol, glipiZIDE, torsemide, potassium chloride SA, guaiFENesin, traZODone, and zolpidem.  No orders of the defined types were placed in this encounter.  Asked him to let me know if there is anything that he needed.  Otherwise I will see him in a month.  I spent 20 minutes reviewing his hospital notes and discussing them with the patient and his son [name].  Follow-up: Return in about 1 month (around 08/17/2017), or if symptoms worsen or fail to improve.  [name], MD

## 2017-07-18 NOTE — Discharge Summary (Signed)
[facility] Discharge Summary   Patient: Dillon Andrews GGE:[id number]   PCP: [name], MD DOB: [birth year]   Date of admission: 07/10/2017   Date of discharge: 07/13/2017     Discharge Diagnoses:  Principal Problem:   Acute respiratory failure ([place]) Active Problems:   HCAP (healthcare-associated pneumonia)   DM2 (diabetes mellitus, type 2) ([place])   Chronic combined systolic and diastolic heart failure (HCC)   Bilateral pleural effusion   Esophageal varices in cirrhosis ([place])   Restless leg syndrome   Essential hypertension   CKD (chronic kidney disease), stage III ([place])   Non-ischemic cardiomyopathy ([place])   Mitral regurgitation   Admitted From: home Disposition:  home  Recommendations for Outpatient Follow-up:  1. pleae follow up with cardiology as recommended    Follow-up Information    Bensimhon, Shaune Pascal, MD. Go on 07/23/2017.   Specialty:  Cardiology Why:  3:20 PM, [facility], parking code 1100 Contact information: [place] [address] [phone]          Diet recommendation: cardiac diet  Activity: The patient is advised to gradually reintroduce usual activities.  Discharge Condition: good  Code Status: full code  History of present illness: As per the H and P dictated on admission, "Dillon Andrews is a very pleasant 77 y.o. male with medical history significant for combined CHF, and ICM, tricuspid regurg, mitral regurg, chronic kidney disease stage III, presumed Dillon Andrews, esophageal varices, hypertension, hyperlipidemia, diabetes, GERD, anemia, right bundle branch block, Barretts esophagus presents to the emergency Department chief complaint worsening shortness of breath accompanied by persistent coughing with increased sputum production and fever. Initial evaluation reveals acute respiratory failure likely related to healthcare associated pneumonia in setting of chronic combined HF. [facility] are asked to admit."  Hospital Course:  Summary of his active problems in the hospital is as following. 1.  Acute bronchitis.  Sepsis ruled out.  No pneumonia. Presented with complaints of cough and shortness of breath.  Had fever and tachycardia on admission. Lactic acid was normal, blood pressure is stable. Pro calcitonin level negative. Influenza PCR negative as well. Blood cultures were performed.  Cultures are negative for 48 hours And patient was admitted for probable sepsis started on IV vancomycin and cefepime. Currently MRSA PCR Due to presence of low-grade fever continue antibiotics for a total of 5 days.  Change to oral doxycycline.  2.  Acute on chronic combined CHF. NICM Amyloid deposition disorder. Cardiac catheterization was negative. Recently EF 25%. Diffuse hypokinesis. 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 Cardiology-CHF service consulted. Currently on Coreg,  Aldactone. IV Lasix changed to oral torsemide..  3.  Anxiety and insomnia. Patient requesting to be on benzodiazepine,  Patient had side effect of confusion with Remeron.  Will discontinue Remeron for now.  Add trazodone. Also on gabapentin which should help. Continue as needed Ambien.  4. CKD stage III. Renal function stable.  5.  Type 2 diabetes mellitus. Hemoglobin A1c 7.0. Suggest controlled. Continue home regime.  All other chronic medical condition were stable during the hospitalization.  Patient was ambulatory without any assistance. On the day of the discharge the patient's vitals were stable, and no other acute medical condition were reported by patient. the patient was felt safe to be discharge at hme with family.  Procedures and Results:  none   Consultations:  Cardiology  DISCHARGE MEDICATION: Allergies as of 07/13/2017      Reactions  Spironolactone    After initiation in 04/2017, had hyperkalemia on follow-up labwork so FYI        Medication List    STOP taking these medications   metolazone 2.5 MG tablet Commonly known as:  ZAROXOLYN   mirtazapine 7.5 MG tablet Commonly known as:  REMERON     TAKE these medications   aspirin EC 81 MG tablet Take 81 mg by mouth daily.   carvedilol 6.25 MG tablet Commonly known as:  COREG Take 1 tablet (6.25 mg total) by mouth 2 (two) times daily with a meal.   gabapentin 800 MG tablet Commonly known as:  NEURONTIN Take 1 tablet (800 mg total) by mouth at bedtime.   glipiZIDE 5 MG tablet Commonly known as:  GLUCOTROL Take 0.5 tablets (2.5 mg total) by mouth 2 (two) times daily.   glucose blood test strip Use as instructed   guaiFENesin 600 MG 12 hr tablet Commonly known as:  MUCINEX Take 1 tablet (600 mg total) by mouth 2 (two) times daily.   omeprazole 40 MG capsule Commonly known as:  PRILOSEC Take 40 mg by mouth daily.   potassium chloride SA 20 MEQ tablet Commonly known as:  K-DUR,KLOR-CON Take 1 tablet (20 mEq total) by mouth as directed. With every dose of Metolazone   rosuvastatin 10 MG tablet Commonly known as:  CRESTOR Take 10 mg by mouth daily.   torsemide 20 MG tablet Commonly known as:  DEMADEX Take 2 tablets (40 mg total) by mouth 2 (two) times daily.   traZODone 50 MG tablet Commonly known as:  DESYREL Take 1 tablet (50 mg total) by mouth at bedtime.   vitamin B-12 500 MCG tablet Commonly known as:  CYANOCOBALAMIN Take 500 mcg by mouth every other day.   zolpidem 5 MG tablet Commonly known as:  AMBIEN Take 1 tablet (5 mg total) by mouth at bedtime as needed for sleep.     ASK your doctor about these medications   doxycycline 100 MG tablet Commonly known as:  VIBRA-TABS Take 1 tablet (100 mg total) by mouth every 12 (twelve) hours for 1 day. Ask about: Should I take this medication?      Allergies  Allergen Reactions  . Spironolactone     After initiation in 04/2017, had hyperkalemia on follow-up labwork so FYI   Discharge  Instructions    (HEART FAILURE PATIENTS) Call MD:  Anytime you have any of the following symptoms: 1) 3 pound weight gain in 24 hours or 5 pounds in 1 week 2) shortness of breath, with or without a dry hacking cough 3) swelling in the hands, feet or stomach 4) if you have to sleep on extra pillows at night in order to breathe.   Complete by:  As directed    Avoid straining   Complete by:  As directed    Diet - low sodium heart healthy   Complete by:  As directed    Diet Carb Modified   Complete by:  As directed    Heart Failure patients record your daily weight using the same scale at the same time of day   Complete by:  As directed    Increase activity slowly   Complete by:  As directed    STOP any activity that causes chest pain, shortness of breath, dizziness, sweating, or exessive weakness   Complete by:  As directed      Discharge Exam: Filed Weights   07/11/17 0558 07/12/17 0300 07/13/17 0446  Weight:  83.5 kg (184 lb 1.6 oz) 82.3 kg (181 lb 6.4 oz) 82.9 kg (182 lb 12.8 oz)   Vitals:   07/13/17 0446 07/13/17 1300  BP: 118/64 109/72  Pulse: 76 72  Resp: 18 18  Temp: (!) 97.4 F (36.3 C) 97.6 F (36.4 C)  SpO2: 96% 100%   General: Appear in no distress, no Rash; Oral Mucosa moisty. Cardiovascular: S1 and S2 Present, no Murmur, no JVD Respiratory: Bilateral Air entry present and Clear to Auscultation, no Crackles, no wheezes Abdomen: Bowel Sound present, Soft and no tenderness Extremities: no Pedal edema, no calf tenderness Neurology: Grossly no focal neuro deficit.  The results of significant diagnostics from this hospitalization (including imaging, microbiology, ancillary and laboratory) are listed below for reference.    Significant Diagnostic Studies: Dg Chest 2 View  Result Date: 07/10/2017 CLINICAL DATA:  Seven month history of shortness of breath, 1 week of productive cough, fever to greater than 102 degrees today. History of CHF, previous episodes of pneumonia,  former smoker. EXAM: CHEST - 2 VIEW COMPARISON:  PA and lateral chest x-ray of June 29, 2017 FINDINGS: The lungs are adequately inflated. There are bilateral pleural effusions which appears stable on the left and slightly larger on the right. There is no alveolar infiltrate. There is linear increased density at the left lung base that is stable. The heart and pulmonary vascularity are normal. The mediastinum is normal in width. There is calcification in the wall of the aortic arch. There is calcification of portions of the anterior longitudinal ligament of the thoracic spine. IMPRESSION: Bibasilar atelectasis with small pleural effusions not greatly changed from the previous study though the right pleural effusion is slightly more conspicuous. No pneumonia or overt pulmonary edema. Thoracic aortic atherosclerosis. Electronically Signed   By: David  Martinique M.D.   On: 07/10/2017 12:08   Dg Chest 2 View  Result Date: 06/29/2017 CLINICAL DATA:  Pleural effusion EXAM: CHEST - 2 VIEW COMPARISON:  06/25/2017 FINDINGS: Normal cardiac silhouette. LEFT basilar atelectasis. Probable small effusion. Overall lungs are hyperinflated. No pulmonary edema. No pneumothorax. IMPRESSION: LEFT basilar atelectasis small effusions.  No significant change Electronically Signed   By: Suzy Bouchard M.D.   On: 06/29/2017 08:41   Dg Chest 2 View  Result Date: 06/25/2017 CLINICAL DATA:  Progressively worsening shortness of breath. EXAM: CHEST - 2 VIEW COMPARISON:  03/27/2017 FINDINGS: Cardiomegaly as seen previously. Aortic atherosclerosis. Bilateral pleural effusions layering dependently with dependent pulmonary atelectasis. Upper lungs are clear. No acute bone finding. IMPRESSION: Cardiomegaly, aortic atherosclerosis, pleural effusions and basilar atelectasis. Findings most consistent with chronic congestive heart failure. Electronically Signed   By: Nelson Chimes M.D.   On: 06/25/2017 12:03   Nm Tumor Localization W  Spect  Result Date: 07/02/2017 CLINICAL DATA:  HEART FAILURE. CONCERN FOR CARDIAC AMYLOIDOSIS. EXAM: NUCLEAR MEDICINE TUMOR LOCALIZATION. PYP CARDIAC AMYLOIDOSIS SCAN WITH SPECT TECHNIQUE: Following intravenous administration of radiopharmaceutical, anterior planar images of the chest were obtained. Regions of interest were placed on the heart and contralateral chest wall for quantitative assessment. Additional SPECT imaging of the chest was obtained. RADIOPHARMACEUTICALS:  20.9 mCi TECHNETIUM 99 PYROPHOSPHATE FINDINGS: Planar Visual assessment: Anterior planar imaging demonstrates radiotracer uptake within the heart greater than uptake within the adjacent ribs (Grade 2). Quantitative assessment : Quantitative assessment of the cardiac uptake compared to the contralateral chest wall is equal to 2.24 (H/CL = 2.2). SPECT assessment: SPECT imaging of the chest demonstrates clear radiotracer accumulation within the LEFT ventricle. IMPRESSION: Visual and  quantitative assessment (grade 2, H/CLL equal 2.24) are strongly suggestive of transthyretin amyloidosis. Electronically Signed   By: Suzy Bouchard M.D.   On: 07/02/2017 14:35    Microbiology: Recent Results (from the past 240 hour(s))  Culture, blood (Routine x 2)     Status: None   Collection Time: 07/10/17 12:33 PM  Result Value Ref Range Status   Specimen Description BLOOD RIGHT ANTECUBITAL  Final   Special Requests   Final    BOTTLES DRAWN AEROBIC AND ANAEROBIC Blood Culture adequate volume   Culture   Final    NO GROWTH 5 DAYS Performed at [facility], [address], [place], [place] [address]    Report Status 07/15/2017 FINAL  Final  Culture, blood (Routine x 2)     Status: None   Collection Time: 07/10/17  1:24 PM  Result Value Ref Range Status   Specimen Description BLOOD LEFT HAND  Final   Special Requests   Final    BOTTLES DRAWN AEROBIC AND ANAEROBIC Blood Culture adequate volume   Culture   Final    NO GROWTH 5  DAYS Performed at [facility], [place] [address], [place], [place] [address]    Report Status 07/15/2017 FINAL  Final  Culture, sputum-assessment     Status: None   Collection Time: 07/10/17  2:34 PM  Result Value Ref Range Status   Specimen Description SPUTUM  Final   Special Requests NONE  Final   Sputum evaluation   Final    THIS SPECIMEN IS ACCEPTABLE FOR SPUTUM CULTURE Performed at [facility], [address], [place], [place] [address]    Report Status 07/10/2017 FINAL  Final  Culture, respiratory (NON-Expectorated)     Status: None   Collection Time: 07/10/17  2:34 PM  Result Value Ref Range Status   Specimen Description SPUTUM  Final   Special Requests NONE Reflexed from [id number]  Final   Gram Stain   Final    ABUNDANT WBC PRESENT,BOTH PMN AND MONONUCLEAR RARE GRAM POSITIVE COCCI    Culture   Final    FEW Consistent with normal respiratory flora. Performed at [facility], [place] [address], [place], [place] [address]    Report Status 07/12/2017 FINAL  Final  MRSA PCR Screening     Status: None   Collection Time: 07/11/17 11:44 AM  Result Value Ref Range Status   MRSA by PCR NEGATIVE NEGATIVE Final    Comment:        The GeneXpert MRSA Assay (FDA approved for NASAL specimens only), is one component of a comprehensive MRSA colonization surveillance program. It is not intended to diagnose MRSA infection nor to guide or monitor treatment for MRSA infections. Performed at [facility], [place] [address] [place], [place] [address]      Labs: CBC: No results for input(s): WBC, NEUTROABS, HGB, HCT, MCV, PLT in the last 168 hours. Basic Metabolic Panel: Recent Labs  Lab 07/12/17 0147 07/13/17 0416  NA 141 140  K 3.4* 3.3*  CL 97* 98*  CO2 31 30  GLUCOSE 134* 104*  BUN 38* 40*  CREATININE 2.10* 1.90*  CALCIUM 8.8* 8.8*   Liver Function Tests: No results for input(s): AST, ALT, ALKPHOS, BILITOT, PROT, ALBUMIN in the last 168 hours. No  results for input(s): LIPASE, AMYLASE in the last 168 hours. No results for input(s): AMMONIA in the last 168 hours. Cardiac Enzymes: No results for input(s): CKTOTAL, CKMB, CKMBINDEX, TROPONINI  in the last 168 hours. BNP (last 3 results) Recent Labs    11/19/16 1326 03/27/17 1215 06/25/17 1207  BNP 439.5* 489.5* 420.9*   CBG: Recent Labs  Lab 07/12/17 1229 07/12/17 1632 07/12/17 2056 07/13/17 0743 07/13/17 1126  GLUCAP 130* 134* 127* 108* 158*   Time spent: 35 minutes  Signed:  Berle Mull  [facility] 07/13/2017  , 2:35 PM

## 2017-07-23 ENCOUNTER — Encounter (HOSPITAL_COMMUNITY): Payer: Self-pay | Admitting: Internal Medicine

## 2017-07-23 ENCOUNTER — Ambulatory Visit (HOSPITAL_COMMUNITY)
Admission: RE | Admit: 2017-07-23 | Discharge: 2017-07-23 | Disposition: A | Discharge status: Home / Self Care | Payer: Medicare HMO | Source: Ambulatory Visit | Attending: Internal Medicine | Admitting: Internal Medicine

## 2017-07-23 VITALS — BP 123/61 | HR 68 | Wt 180.5 lb

## 2017-07-23 DIAGNOSIS — E1122 Type 2 diabetes mellitus with diabetic chronic kidney disease: Secondary | ICD-10-CM | POA: Diagnosis not present

## 2017-07-23 DIAGNOSIS — Z85828 Personal history of other malignant neoplasm of skin: Secondary | ICD-10-CM | POA: Insufficient documentation

## 2017-07-23 DIAGNOSIS — I429 Cardiomyopathy, unspecified: Secondary | ICD-10-CM | POA: Diagnosis not present

## 2017-07-23 DIAGNOSIS — Z7982 Long term (current) use of aspirin: Secondary | ICD-10-CM | POA: Diagnosis not present

## 2017-07-23 DIAGNOSIS — Z87891 Personal history of nicotine dependence: Secondary | ICD-10-CM | POA: Insufficient documentation

## 2017-07-23 DIAGNOSIS — Z79899 Other long term (current) drug therapy: Secondary | ICD-10-CM | POA: Insufficient documentation

## 2017-07-23 DIAGNOSIS — I5042 Chronic combined systolic (congestive) and diastolic (congestive) heart failure: Secondary | ICD-10-CM | POA: Insufficient documentation

## 2017-07-23 DIAGNOSIS — E854 Organ-limited amyloidosis: Secondary | ICD-10-CM

## 2017-07-23 DIAGNOSIS — I340 Nonrheumatic mitral (valve) insufficiency: Secondary | ICD-10-CM | POA: Diagnosis not present

## 2017-07-23 DIAGNOSIS — N183 Chronic kidney disease, stage 3 (moderate): Secondary | ICD-10-CM | POA: Diagnosis not present

## 2017-07-23 DIAGNOSIS — I43 Cardiomyopathy in diseases classified elsewhere: Secondary | ICD-10-CM

## 2017-07-23 DIAGNOSIS — I130 Hypertensive heart and chronic kidney disease with heart failure and stage 1 through stage 4 chronic kidney disease, or unspecified chronic kidney disease: Secondary | ICD-10-CM | POA: Insufficient documentation

## 2017-07-23 DIAGNOSIS — I2510 Atherosclerotic heart disease of native coronary artery without angina pectoris: Secondary | ICD-10-CM | POA: Diagnosis not present

## 2017-07-23 LAB — BASIC METABOLIC PANEL
Anion gap: 13 (ref 5–15)
BUN: 32 mg/dL — ABNORMAL HIGH (ref 6–20)
CHLORIDE: 95 mmol/L — AB (ref 101–111)
CO2: 31 mmol/L (ref 22–32)
Calcium: 9.2 mg/dL (ref 8.9–10.3)
Creatinine, Ser: 1.71 mg/dL — ABNORMAL HIGH (ref 0.61–1.24)
GFR calc non Af Amer: 37 mL/min — ABNORMAL LOW (ref >60)
GFR, EST AFRICAN AMERICAN: 43 mL/min — AB (ref >60)
Glucose, Bld: 130 mg/dL — ABNORMAL HIGH (ref 65–99)
POTASSIUM: 4.4 mmol/L (ref 3.5–5.1)
SODIUM: 139 mmol/L (ref 135–145)

## 2017-07-23 NOTE — Progress Notes (Signed)
[facility] Note   Referring Physician: PCP: [name], MD PCP-Cardiologist: Glenetta Hew, MD   HPI: Dillon Andrews is a 77 y.o. malewith a hx of chronic combined CHF due to NICM, CKD III,presumedNASH(per pt, never proven),esophageal varices per chart(prior glass of wine occasionally but no heavy alcohol),HTN, HLD, DM, Barrett's esophagus, GERD, anemia, RBBB  He was admitted 3/13-3/17 for A/C combined HF. While inpatient, SPEP and UPEP sent for concerns for cardiac amyloidosis. He diuresed with IV lasix and was transitioned to lasix 80 mg q am, 40 mg q pm. DC weight 180 lbs.   His SPEP came back with m-spike 0.8% and he was referred to Dr Beryle Beams. UPEP with Bence Jones Protein positive; kappa type.   PYP scan completed on 3/20 strongly suggestive of transthyretin amyloidosis. Genetic testing negative.  Seen in clinic on 3/26 and transitioned from lasix to torsemide. Admitted 3/28-3/31 with URI and A/C HF. URI treated with antibiotics. Diuresed with IV lasix, then transitioned to torsemide 40 mg BID. Genetic testing sent.   He presents today for hospital follow up with his daugther. Overall doing well. Symptomatically much improved.  Can walk 100 feet before he gets SOB and fatigued. Feels like he continues to get stronger. He lost 6 lbs in 5 days, so cut back torsemide to 20 mg BID. Sleeps on a wedge. No PND/edema. No CP or palpitations. No cough or sputum production,  Rare mild dizziness with sitting to standing. Weights over last 3 days: 175-176 lbs. Compliant with meds.     Past Medical History:  Diagnosis Date  . Anemia 11/2016  . Arthritis    "some; hands/fingers" (06/25/2017)  . Atrial tachycardia ([place])   . Barrett's esophagus   . Basal cell carcinoma    "face, arms, hands" (06/25/2017)  . Chicken pox   . Cholelithiasis 11/2016   S/P LAP CHOLE  . Chronic combined systolic and diastolic CHF (congestive heart failure) ([place])   . CKD  (chronic kidney disease), stage III ([place])   . Esophageal varices (HCC)    Secondary to NAFLD  . Family history of adverse reaction to anesthesia    "daughter's sat drop very low; last OR had to be reintubated" (06/25/2017)  . GERD (gastroesophageal reflux disease)   . High cholesterol   . Hypertension   . Mitral regurgitation   . NAFLD (nonalcoholic fatty liver disease)   . NICM (nonischemic cardiomyopathy) ([place])   . NSVT (nonsustained ventricular tachycardia) ([place])   . Pneumonia 09/2016  . RBBB   . Tricuspid regurgitation   . Type II diabetes mellitus ([place])     Current Outpatient Medications  Medication Sig Dispense Refill  . aspirin EC 81 MG tablet Take 81 mg by mouth daily.    . carvedilol (COREG) 6.25 MG tablet Take 1 tablet (6.25 mg total) by mouth 2 (two) times daily with a meal. 180 tablet 0  . gabapentin (NEURONTIN) 800 MG tablet Take 1 tablet (800 mg total) by mouth at bedtime. 90 tablet 1  . glipiZIDE (GLUCOTROL) 5 MG tablet Take 0.5 tablets (2.5 mg total) by mouth 2 (two) times daily. 90 tablet 1  . glucose blood test strip Use as instructed 100 each 12  . guaiFENesin (MUCINEX) 600 MG 12 hr tablet Take 1 tablet (600 mg total) by mouth 2 (two) times daily. 30 tablet 0  . omeprazole (PRILOSEC) 40 MG capsule Take 40 mg by mouth daily.    . potassium chloride SA (K-DUR,KLOR-CON) 20 MEQ tablet  Take 1 tablet (20 mEq total) by mouth as directed. With every dose of Metolazone 10 tablet 0  . rosuvastatin (CRESTOR) 10 MG tablet Take 10 mg by mouth daily.    Marland Kitchen torsemide (DEMADEX) 20 MG tablet Take 20 mg by mouth 2 (two) times daily.    . traZODone (DESYREL) 50 MG tablet Take 1 tablet (50 mg total) by mouth at bedtime. 30 tablet 0  . vitamin B-12 (CYANOCOBALAMIN) 500 MCG tablet Take 500 mcg by mouth every other day.     No current facility-administered medications for this encounter.     No Active Allergies    Social History   Socioeconomic History  . Marital status: Married      Spouse name: Not on file  . Number of children: Not on file  . Years of education: Not on file  . Highest education level: Not on file  Occupational History  . Not on file  Social Needs  . Financial resource strain: Not on file  . Food insecurity:    Worry: Not on file    Inability: Not on file  . Transportation needs:    Medical: Not on file    Non-medical: Not on file  Tobacco Use  . Smoking status: Former Smoker    Packs/day: 0.30    Years: 15.00    Pack years: 4.50    Types: Cigarettes    Last attempt to quit: 03/17/1977    Years since quitting: 40.3  . Smokeless tobacco: Never Used  Substance and Sexual Activity  . Alcohol use: Yes    Comment: 06/25/2017 "nothing since 09/2016;  used to have glass of wine q hs"  . Drug use: No  . Sexual activity: Not on file  Lifestyle  . Physical activity:    Days per week: Not on file    Minutes per session: Not on file  . Stress: Not on file  Relationships  . Social connections:    Talks on phone: Not on file    Gets together: Not on file    Attends religious service: Not on file    Active member of club or organization: Not on file    Attends meetings of clubs or organizations: Not on file    Relationship status: Not on file  . Intimate partner violence:    Fear of current or ex partner: Not on file    Emotionally abused: Not on file    Physically abused: Not on file    Forced sexual activity: Not on file  Other Topics Concern  . Not on file  Social History Narrative   Accompanied by his daughter --> also a patient of Dr. Ellyn Hack      Family History  Problem Relation Age of Onset  . Cancer Father   . Cataracts Father   . Breast cancer Sister   . Cirrhosis Neg Hx     Vitals:   07/23/17 1526  BP: 123/61  Pulse: 68  SpO2: 99%  Weight: 180 lb 8 oz (81.9 kg)   Filed Weights   07/23/17 1526  Weight: 180 lb 8 oz (81.9 kg)    PHYSICAL EXAM: General: Well appearing. No resp difficulty. HEENT: Normal  anicteric Neck: Supple. JVP 5-6. Carotids 2+ bilat; no bruits. No thyromegaly or nodule noted. Cor: PMI nondisplaced. RRR, No M/G/R noted Lungs: CTAB, normal effort. No wheeze Abdomen: Soft, non-tender, non-distended, no HSM. No bruits or masses. +BS  Extremities: No cyanosis, clubbing, or rash. R and LLE no  edema.  Neuro: alert & oriented x 3, cranial nerves grossly intact. moves all 4 extremities w/o difficulty. Affect pleasant    ASSESSMENT & PLAN:  1. Chronic combined HF. NICM by [facility] 09/2016 with EF 40-45%. Echo 06/2017: EF 25-30%, mild LAE, mild/mod RV dilation, mod/severe RA. 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  - NYHA class III. Volume status stable.  - Continue torsemide 20 mg BID.  - Continue coreg 6.25 mg BID. - Continue spiro 12.5 mg daily - No ARB/entresto with CKD - Sees Dr Posey Pronto tomorrow.   2. CKD III, baseline ~1.6 - Creatinine 1.9 07/13/17  3. Hx of moderate mitral regurgitation on [facility] TEE 09/2016 - No mention of MR on more recent Echo.   4. HTN - Well controlled with HF meds. Continue current treatment.   5. HL - Continue crestor - LDL 92 11/2016 (care everywhere)  6. Recurrent bilateral effusions - Did not require thoracentesis while inpatient. Clear on exam today  BMET today Refer to cardiac rehab  Georgiana Shore, NP 07/23/17   Patient seen and examined with the above-signed Advanced Practice Provider and/or Housestaff. I personally reviewed laboratory data, imaging studies and relevant notes. I independently examined the patient and formulated the important aspects of the plan. I have edited the note to reflect any of my changes or salient points. I have personally discussed the plan with the patient and/or family.  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 Volume status and symptoms much improved after recent  hospitalization,.He has decreased torsemide on his own due to ongoing weight loss  Which was appropriate. He has f/u with Neurology tomorrow. We discussed possible referral to [facility] for enrolment in AG10 trial but he would prefer to wait for tafamadis as he is concerned about being randomized to placebo. Discussed with pharmD. We are in process of applying for compassionate use of tafamadis.  Check labs today.   Glori Bickers, MD  11:07 AM

## 2017-07-23 NOTE — Patient Instructions (Signed)
Labs today  You have been referred to Cardiac Rehab, they will call you to schedule, it may take a few weeks  Your physician recommends that you schedule a follow-up appointment in: 2 months

## 2017-07-24 ENCOUNTER — Ambulatory Visit (INDEPENDENT_AMBULATORY_CARE_PROVIDER_SITE_OTHER): Payer: Medicare HMO | Admitting: Neurology

## 2017-07-24 ENCOUNTER — Ambulatory Visit: Payer: Medicare HMO | Admitting: Neurology

## 2017-07-24 ENCOUNTER — Encounter: Payer: Self-pay | Admitting: Neurology

## 2017-07-24 ENCOUNTER — Telehealth (HOSPITAL_COMMUNITY): Payer: Self-pay

## 2017-07-24 ENCOUNTER — Other Ambulatory Visit (INDEPENDENT_AMBULATORY_CARE_PROVIDER_SITE_OTHER): Payer: Medicare HMO

## 2017-07-24 VITALS — BP 110/74 | HR 62 | Ht 71.0 in | Wt 180.0 lb

## 2017-07-24 DIAGNOSIS — M5412 Radiculopathy, cervical region: Secondary | ICD-10-CM

## 2017-07-24 DIAGNOSIS — R200 Anesthesia of skin: Secondary | ICD-10-CM | POA: Diagnosis not present

## 2017-07-24 DIAGNOSIS — G629 Polyneuropathy, unspecified: Secondary | ICD-10-CM

## 2017-07-24 DIAGNOSIS — E851 Neuropathic heredofamilial amyloidosis: Secondary | ICD-10-CM | POA: Diagnosis not present

## 2017-07-24 DIAGNOSIS — G63 Polyneuropathy in diseases classified elsewhere: Secondary | ICD-10-CM

## 2017-07-24 DIAGNOSIS — E8582 Wild-type transthyretin-related (ATTR) amyloidosis: Secondary | ICD-10-CM

## 2017-07-24 LAB — FOLATE: Folate: >24.1 ng/mL (ref >5.9)

## 2017-07-24 LAB — VITAMIN B12: Vitamin B-12: 1469 pg/mL — ABNORMAL HIGH (ref 211–911)

## 2017-07-24 NOTE — Patient Instructions (Addendum)
Recommend placing grab bars in the shower and using night lights to help with your balance  Check labs  We will call you with the results  Return to clinic in 6 months

## 2017-07-24 NOTE — Procedures (Signed)
[facility]  [place], [place]  [place], [place] [address] Tel: [phone] Fax:  [phone] Test Date:  07/24/2017  Patient: Dillon Andrews DOB: 1940/10/25 Physician: Narda Amber, DO  Sex: Male Height: 5' 11"  Ref Phys: Narda Amber, DO  ID#: [id number] Temp: 32.0 Technician:    Patient Complaints: Dillon Andrews is a 77 year old man with cardiac amyloidosis referred for evaluation of neuropathy.  NCV & EMG Findings: Extensive electrodiagnostic testing of the right upper and lower extremity shows:  1. In the right upper extremity, the median and ulnar sensory responses show prolonged latency; the median sensory response also shows reduced amplitude. The right radial sensory responses within normal limits.  2. All sensory responses are absent in the right lower extremity. 3. Right median motor response shows prolonged latency and normal amplitude. Right ulnar motor response shows mild conduction velocity slowing across the elbow. 4. Right tibial motor response shows reduced amplitude and slowed conduction velocity. Right peroneal motor responses within normal limits. 5. Right tibial H reflex study is prolonged. 6. In the right upper extremity, chronic motor axon loss changes are seen affecting the pronator teres, biceps, and deltoid muscles. 7. In the right lower extremity, chronic motor axon loss changes are seen affecting the muscles below the knee, with active denervation isolated to the medial gastrocnemius muscle.   Impression: 1. The electrophysiologic findings are most consistent with a length dependent, acute on chronic sensorimotor polyneuropathy, axon loss and demyelinating in type, affecting the right arm and leg. 2. Chronic C5-6 radiculopathy affecting the right upper extremity, mild-to-moderate in degree electrically   ___________________________ Narda Amber, DO    Nerve Conduction Studies Anti Sensory Summary Table   Site NR Peak (ms) Norm Peak  (ms) P-T Amp (V) Norm P-T Amp  Right Median Anti Sensory (2nd Digit)  Wrist    4.7 <3.8 8.3 >10  Right Radial Anti Sensory (Base 1st Digit)  Wrist    2.8 <2.8 12.7 >10  Right Sup Peroneal Anti Sensory (Ant Lat Mall)  12 cm NR  <4.6  >3  Right Sural Anti Sensory (Lat Mall)  Calf NR  <4.6  >3  Right Ulnar Anti Sensory (5th Digit)  Wrist    4.1 <3.2 10.6 >5   Motor Summary Table   Site NR Onset (ms) Norm Onset (ms) O-P Amp (mV) Norm O-P Amp Site1 Site2 Delta-0 (ms) Dist (cm) Vel (m/s) Norm Vel (m/s)  Right Median Motor (Abd Poll Brev)  Wrist    4.6 <4.0 5.0 >5 Elbow Wrist 5.7 31.0 54 >50  Elbow    10.3  5.0         Right Peroneal Motor (Ext Dig Brev)  Ankle    5.0 <6.0 2.5 >2.5 B Fib Ankle 9.6 39.0 41 >40  B Fib    14.6  2.0  Poplt B Fib 2.1 10.0 48 >40  Poplt    16.7  2.0         Right Tibial Motor (Abd Hall Brev)  Ankle    4.0 <6.0 1.6 >4 Knee Ankle 11.5 39.0 34 >40  Knee    15.5  1.6         Right Ulnar Motor (Abd Dig Minimi)  Wrist    2.6 <3.1 8.1 >7 B Elbow Wrist 4.8 24.0 50 >50  B Elbow    7.4  8.1  A Elbow B Elbow 2.1 10.0 48 >50  A Elbow    9.5  7.8  H Reflex Studies   NR H-Lat (ms) Lat Norm (ms) L-R H-Lat (ms)  Right Tibial (Gastroc)     38.23 <35    EMG   Side Muscle Ins Act Fibs Psw Fasc Number Recrt Dur Dur. Amp Amp. Poly Poly. Comment  Right AntTibialis Nml Nml Nml Nml 1- Rapid Few 1+ Few 1+ Nml Nml N/A  Right Gastroc Nml 1+ Nml Nml 2- Rapid Some 1+ Some 1+ Nml Nml N/A  Right Flex Dig Long Nml Nml Nml Nml 2- Rapid Some 1+ Some 1+ Nml Nml N/A  Right RectFemoris Nml Nml Nml Nml Nml Nml Nml Nml Nml Nml Nml Nml N/A  Right GluteusMed Nml Nml Nml Nml Nml Nml Nml Nml Nml Nml Nml Nml N/A  Right BicepsFemS Nml Nml Nml Nml Nml Nml Nml Nml Nml Nml Nml Nml N/A  Right 1stDorInt Nml Nml Nml Nml 1- Rapid Some 1+ Some 1+ Nml Nml N/A  Right Abd Poll Brev Nml Nml Nml Nml 1- Rapid Some 1+ Some 1+ Nml Nml N/A  Right Ext Indicis Nml Nml Nml Nml 1- Rapid Few 1+ Few 1+ Nml  Nml N/A  Right PronatorTeres Nml Nml Nml Nml 1- Rapid Some 1+ Some 1+ Nml Nml N/A  Right Biceps Nml Nml Nml Nml 1- Rapid Some 1+ Some 1+ Nml Nml N/A  Right Deltoid Nml Nml Nml Nml 1- Rapid Some 1+ Some 1+ Nml Nml N/A  Right Triceps Nml Nml Nml Nml Nml Nml Nml Nml Nml Nml Nml Nml N/A      Waveforms:

## 2017-07-24 NOTE — Telephone Encounter (Signed)
Referral received. Insurance benefits and eligibility to be determined. Passed to [employer] for review.

## 2017-07-24 NOTE — Progress Notes (Signed)
[facility] Clinic Note - Initial Visit   Date: 07/24/17  Dillon Andrews MRN: [id number] DOB: [birth year]   Dear Dr. [name]:   Thank you for your kind referral of Dillon Andrews for consultation of neuropathy. Although his history is well known to you, please allow [place] to reiterate it for the purpose of our medical record. The patient was accompanied to the clinic by daughter, Dillon Andrews, who also provides collateral information.     History of Present Illness: Dillon Andrews is a 77 y.o. right-handed Caucasian male with diabetes mellitus, nonischemic cardiomyopathy, CHF (EF 25-30%), hypertension, hyperlipidemia, stage III CKD, Barrett's esophagus, GERD, anemia, and bilateral CTS release presenting for evaluation of neuropathy.    Patient was in his usual state of health until June 2018 when he started having shortness of breath and nausea.  He was initially evaluated at [facility] and diagnosed with new onset heart failure.  He presented to [facility] in August with epigastric pain and was found to have cholecystitis s/p cholecystectomy in August 2018.  Since this time, he had had progressive problems with fatigue, shortness of breath, and swelling and has been hospitalized three times for acute on chronic heart failure (12/13 - 12/16, 3/31-3/17 and 3/28-3/31).  While hospitalized more recently, he underwent PYP scan which was suggestive of transthyretin amyloidosis and SPEP with monoclonal protein spike 0.8%, and UPEP with Bence Jones protein positive, kappa type. Genetic testing for transthyretin is negative.  Findings are most consistent with wild-type transthyretin amyloidosis. He is here for neurological evaluation.  He previously used to stay very active and enjoys playing at least twice per week, but due to shortness of breath, tends to ride in the golf cart more than play. He also enjoys working on a [employer] and retired from his [employer]. He lives with wife in a one-story home.   He denies any imbalance, burning pain, numbness, falls, or weakness of the hands and feet.  He has some dry eyes and dry mouth.  Denies early satiety.  No bowel/bladder incontinence. He takes gabapentin 857m for insomnia and restless leg syndrome.   He has been diabetic for about 15 years, which has been well controlled on oral medication. He has history of bilateral CTS release around the age of 77  There is no family history of neuropathy. Mother died at age of 78of heart failure.  Interestingly, he also reports having biceps tendon rupture when he was younger, this was surgically repaired.   Out-side paper records, electronic medical record, and images have been reviewed where available and summarized as:  Surface echo 06/26/2017: - Left ventricle: The cavity size was normal. Wall thickness was increased in a pattern of severe LVH. Systolic function was   severely reduced. The estimated ejection fraction was in the  range of 25% to 30%. Mild hypokinesis of the basalanteroseptal myocardium. - Aortic valve: Mildly calcified annulus. Trileaflet; normal thickness, mildly calcified leaflets. - Left atrium: The atrium was mildly dilated. - Right ventricle: The cavity size was mildly to moderately dilated. Wall thickness was normal. - Right atrium: The atrium was moderately to severely dilated.  NM Cardiac PYP scan with SPECT 07/02/2017: Visual and quantitative assessment (grade 2, H/CLL equal 2.24) are strongly suggestive of transthyretin amyloidosis.  Lab Results  Component Value Date   HGBA1C 7.0 (H) 07/10/2017   Lab Results  Component Value Date   TSH 2.456 06/26/2017    Past Medical History:  Diagnosis Date  .  Anemia 11/2016  . Arthritis    "some; hands/fingers" (06/25/2017)  . Atrial tachycardia ([place])   . Barrett's esophagus   . Basal cell carcinoma    "face, arms, hands" (06/25/2017)  . Chicken pox   . Cholelithiasis 11/2016     S/P LAP CHOLE  . Chronic combined systolic and diastolic CHF (congestive heart failure) ([place])   . CKD (chronic kidney disease), stage III ([place])   . Esophageal varices ([facility])    Secondary to NAFLD  . Family history of adverse reaction to anesthesia    "daughter's sat drop very low; last OR had to be reintubated" (06/25/2017)  . GERD (gastroesophageal reflux disease)   . High cholesterol   . Hypertension   . Mitral regurgitation   . NAFLD (nonalcoholic fatty liver disease)   . NICM (nonischemic cardiomyopathy) ([place])   . NSVT (nonsustained ventricular tachycardia) ([place])   . Pneumonia 09/2016  . RBBB   . Tricuspid regurgitation   . Type II diabetes mellitus ([place])     Past Surgical History:  Procedure Laterality Date  . BACK SURGERY    . BASAL CELL CARCINOMA EXCISION Right    temple; "had to do skin graft"  . CARDIAC CATHETERIZATION  09/2016   Minimal, non-occlusive CAD, EF 45% [facility])  . CARPAL TUNNEL RELEASE Bilateral   . CATARACT EXTRACTION W/ INTRAOCULAR LENS  IMPLANT, BILATERAL Bilateral   . CHOLECYSTECTOMY N/A 11/22/2016   Procedure: LAPAROSCOPIC CHOLECYSTECTOMY;  Surgeon: Coralie Keens, MD;  Location: [place];  Service: General;  Laterality: N/A;  . JOINT REPLACEMENT    . LUMBAR DISC SURGERY  X 2  . TONSILLECTOMY AND ADENOIDECTOMY    . TOTAL KNEE ARTHROPLASTY Left 2010  . TRANSESOPHAGEAL ECHOCARDIOGRAM  09/2016   Mild global LV hypokinesis. Moderate MR. Moderate TR. [facility])  . TRANSTHORACIC ECHOCARDIOGRAM  10/2016   [facility]) EF 35-40% with moderate HK. Normal RV function. Moderately severe TR.  Marland Kitchen TRANSTHORACIC ECHOCARDIOGRAM  03/2017    [facility]) mild concentric LVH. Moderately reduced EF of 40-45%. Diffuse HK with no regional wall motion abnormality. GR 2 DD. Septal dyssynergy. Mild MR, moderate TR. Severe RA dilation..     Medications:  Outpatient Encounter Medications as of 07/24/2017  Medication Sig  . aspirin EC 81 MG  tablet Take 81 mg by mouth daily.  . carvedilol (COREG) 6.25 MG tablet Take 1 tablet (6.25 mg total) by mouth 2 (two) times daily with a meal.  . gabapentin (NEURONTIN) 800 MG tablet Take 1 tablet (800 mg total) by mouth at bedtime.  Marland Kitchen glipiZIDE (GLUCOTROL) 5 MG tablet Take 0.5 tablets (2.5 mg total) by mouth 2 (two) times daily.  Marland Kitchen glucose blood test strip Use as instructed  . omeprazole (PRILOSEC) 40 MG capsule Take 40 mg by mouth daily.  . potassium chloride SA (K-DUR,KLOR-CON) 20 MEQ tablet Take 1 tablet (20 mEq total) by mouth as directed. With every dose of Metolazone  . rosuvastatin (CRESTOR) 10 MG tablet Take 10 mg by mouth daily.  Marland Kitchen torsemide (DEMADEX) 20 MG tablet Take 20 mg by mouth 2 (two) times daily.  . traZODone (DESYREL) 50 MG tablet Take 1 tablet (50 mg total) by mouth at bedtime.  . vitamin B-12 (CYANOCOBALAMIN) 500 MCG tablet Take 500 mcg by mouth every other day.  . [DISCONTINUED] guaiFENesin (MUCINEX) 600 MG 12 hr tablet Take 1 tablet (600 mg total) by mouth 2 (two) times daily.   No facility-administered encounter medications on file as of  07/24/2017.      Allergies: No Active Allergies  Family History: Family History  Problem Relation Age of Onset  . Heart failure Mother   . Cancer Father   . Cataracts Father   . Breast cancer Sister   . Alcohol abuse Brother   . Liver disease Brother   . Autoimmune disease Daughter        Bechet's syndrome  . Cirrhosis Neg Hx     Social History: Social History   Tobacco Use  . Smoking status: Former Smoker    Packs/day: 0.30    Years: 15.00    Pack years: 4.50    Types: Cigarettes    Last attempt to quit: 03/17/1977    Years since quitting: 40.3  . Smokeless tobacco: Never Used  Substance Use Topics  . Alcohol use: Yes    Comment: 06/25/2017 "nothing since 09/2016;  used to have glass of wine q hs"  . Drug use: No   Social History   Social History Narrative   He is retired from [employer].  Previously  worked for [employer] by his daughter --> also a patient of Dr. Ellyn Hack   Lives with wife in a one story home.    Education: high school.     Review of Systems:  CONSTITUTIONAL: No fevers, chills, night sweats, +weight loss.   EYES: No visual changes or eye pain ENT: No hearing changes.  No history of nose bleeds.   RESPIRATORY: No cough, wheezing +shortness of breath.   CARDIOVASCULAR: Negative for chest pain, and palpitations.   GI: Negative for abdominal discomfort, blood in stools or black stools.  No recent change in bowel habits.   GU:  No history of incontinence.   MUSCLOSKELETAL: No history of joint pain +swelling.  No myalgias.   SKIN: Negative for lesions, rash, and itching.   HEMATOLOGY/ONCOLOGY: Negative for prolonged bleeding, bruising easily, and swollen nodes.  No history of cancer.   ENDOCRINE: Negative for cold or heat intolerance, polydipsia or goiter.   PSYCH:  No depression or anxiety symptoms.   NEURO: As Above.   Vital Signs:  BP 110/74   Pulse 62   Ht 5' 11"  (1.803 m)   Wt 180 lb (81.6 kg)   SpO2 96%   BMI 25.10 kg/m    General Medical Exam:   General:  Well appearing, comfortable.   Eyes/ENT: see cranial nerve examination.   Neck: No masses appreciated.  Full range of motion without tenderness.  No carotid bruits. Respiratory:  Clear to auscultation, good air entry bilaterally.   Cardiac:  Regular rate and rhythm, soft systolic murmur.   Extremities:  No deformities, edema, or skin discoloration.  Skin:  No rashes or lesions.  Neurological Exam: MENTAL STATUS including orientation to time, place, person, recent and remote memory, attention span and concentration, language, and fund of knowledge is normal.  Speech is not dysarthric.  CRANIAL NERVES: II:  No visual field defects.  Unremarkable fundi.   III-IV-VI: Pupils equal round and reactive to light.  Normal conjugate, extra-ocular eye movements in all directions of gaze.  No  nystagmus.  No ptosis.   V:  Normal facial sensation.   VII:  Normal facial symmetry and movements.  VIII:  Normal hearing and vestibular function.   IX-X:  Normal palatal movement.   XI:  Normal shoulder shrug and head rotation.   XII:  Normal tongue strength and range of motion, no deviation or fasciculation.  MOTOR:  No atrophy, fasciculations or abnormal movements.  No pronator drift.  Tone is normal.  There is an asymmetrical bulge at the right biceps muscle with contraction consistent with history of right biceps tendon rupture.  Right Upper Extremity:    Left Upper Extremity:    Deltoid  5/5   Deltoid  5/5   Biceps  5/5   Biceps  5/5   Triceps  5/5   Triceps  5/5   Wrist extensors  5/5   Wrist extensors  5/5   Wrist flexors  5/5   Wrist flexors  5/5   Finger extensors  5/5   Finger extensors  5/5   Finger flexors  5/5   Finger flexors  5/5   Dorsal interossei  5-/5   Dorsal interossei  5-/5   Abductor pollicis  5/5   Abductor pollicis  5/5   Tone (Ashworth scale)  0  Tone (Ashworth scale)  0   Right Lower Extremity:    Left Lower Extremity:    Hip flexors  5/5   Hip flexors  5/5   Hip extensors  5/5   Hip extensors  5/5   Knee flexors  5/5   Knee flexors  5/5   Knee extensors  5/5   Knee extensors  5/5   Dorsiflexors  5/5   Dorsiflexors  5/5   Plantarflexors  5/5   Plantarflexors  5/5   Toe extensors  5/5   Toe extensors  5/5   Toe flexors  5/5   Toe flexors  5/5   Tone (Ashworth scale)  0  Tone (Ashworth scale)  0   MSRs:  Right                                                                 Left brachioradialis 2+  brachioradialis 2+  biceps 2+  biceps 2+  triceps 2+  triceps 2+  patellar 2+  patellar 2+  ankle jerk 0  ankle jerk 0  Hoffman no  Hoffman no  plantar response down  plantar response down   SENSORY:  Vibration is reduced to 70% at the right knee, 100% at left knee, and bilateral ankles is 30%, trace at the great toe.  There is a gradient pattern of  sensory loss to temperature and pin prick below the ankles. proprioception at the great toe is intact. Sensation in the arms in intact.  Romberg's sign is present.   COORDINATION/GAIT: Normal finger-to- nose-finger.  Intact rapid alternating movements bilaterally.  Able to rise from a chair without using arms.  Gait mildly wide-based and stable, unassisted.  He is unsteady with tandem and heel walking.    IMPRESSION: Dillon Andrews is a delightful 77 year-old man with clinically probable wild-type transthyretin cardiac amyloidosis referred for evaluation of neuropathy.  He underwent electrodiagnostic testing which confirm the presence of a length dependent axonal and demyelinating sensorimotor polyneuropathy (see EMG report).  His exam is consistent with these findings with distal sensory loss and sensory ataxia. He does not have any weakness. Risk factors for neuropathy includes amyloidosis and to a lesser degree, diabetes mellitus which is well-controlled.  To be complete, will check vitamin B12, vitamin B1, copper, folate to evaluate for any treatable causes of neuropathy.  He would be  a good candidate for tafamidis. His polyneuropathy disability score is stage II.  Interestingly, he has history of bilateral CTS and right biceps tendon rupture, both which can be early manifestation of wild-type ATTR.  He denies any painful paresthesias so there is no role for medications.  He takes gabapentin 811m for RLS, prescribed by his PCP.  For his sensory ataxia, fall precautions were discussed - recommend that he use night lights and add a grab bar in his shower to minimize risk of falls. Use a cane as needed, especially for uneven surfaces.  I will see him back in 6 months, or sooner as needed.   Thank you for allowing me to participate in patient's care.  If I can answer any additional questions, I would be pleased to do so.    Sincerely,    Donika K. PPosey Pronto DO

## 2017-07-25 ENCOUNTER — Other Ambulatory Visit: Payer: Self-pay

## 2017-07-25 ENCOUNTER — Telehealth (HOSPITAL_COMMUNITY): Payer: Self-pay

## 2017-07-25 MED ORDER — ZOLPIDEM TARTRATE 5 MG PO TABS: 5.0000 mg | ORAL_TABLET | Freq: Every evening | ORAL | 2 refills | Status: DC | PRN

## 2017-07-25 MED ORDER — POTASSIUM CHLORIDE CRYS ER 20 MEQ PO TBCR: 20.0000 meq | EXTENDED_RELEASE_TABLET | ORAL | 2 refills | Status: DC

## 2017-07-25 NOTE — Telephone Encounter (Signed)
Patient came in to the office requesting refills on his potassium and the zolpidem 5 mg tablet that he was given in the hospital. Please advise on refills, the zolpidem needs a prior authorization as well, but I can take care of that.

## 2017-07-25 NOTE — Telephone Encounter (Signed)
Rx's sent in. °

## 2017-07-25 NOTE — Telephone Encounter (Signed)
Patients insurance is active and benefits verified through [facility] - $10.00 co-pay, no deductible, out of pocket amount of $3,400/$1,267.77 has been met, no co-insurance, and no pre-authorization is required.

## 2017-07-27 LAB — VITAMIN B1: Vitamin B1 (Thiamine): 39 nmol/L — ABNORMAL HIGH (ref 8–30)

## 2017-07-27 LAB — COPPER, SERUM: Copper: 146 ug/dL (ref 70–175)

## 2017-07-28 ENCOUNTER — Telehealth (HOSPITAL_COMMUNITY): Payer: Self-pay

## 2017-07-28 ENCOUNTER — Telehealth: Payer: Self-pay

## 2017-07-28 NOTE — Telephone Encounter (Signed)
Called patient to see if he is interested in the Cardiac Rehab Program. Patient stated he is interested. Scheduled orientation on 09/25/17 at 8:15pm. Patient will attend the 2:45pm exc class. Went over insurance with patient and patient verbally stated he understands what he is responsible for. Mailed packet.

## 2017-07-28 NOTE — Telephone Encounter (Signed)
-----   Message from Alda Berthold, DO sent at 07/28/2017  9:12 AM EDT ----- Please notify patient lab are within normal limits.  Thank you.

## 2017-07-28 NOTE — Telephone Encounter (Signed)
Spoke with pt relaying message below.   

## 2017-07-31 ENCOUNTER — Other Ambulatory Visit: Payer: Self-pay

## 2017-07-31 ENCOUNTER — Other Ambulatory Visit: Payer: Self-pay | Admitting: Cardiology

## 2017-07-31 MED ORDER — OMEPRAZOLE 40 MG PO CPDR: 40.0000 mg | DELAYED_RELEASE_CAPSULE | Freq: Every day | ORAL | 1 refills | Status: DC

## 2017-08-01 DIAGNOSIS — Z9049 Acquired absence of other specified parts of digestive tract: Secondary | ICD-10-CM | POA: Diagnosis not present

## 2017-08-01 DIAGNOSIS — K7460 Unspecified cirrhosis of liver: Secondary | ICD-10-CM | POA: Diagnosis not present

## 2017-08-01 DIAGNOSIS — K745 Biliary cirrhosis, unspecified: Secondary | ICD-10-CM | POA: Diagnosis not present

## 2017-08-05 ENCOUNTER — Other Ambulatory Visit (HOSPITAL_COMMUNITY): Payer: Self-pay | Admitting: Cardiology

## 2017-08-08 ENCOUNTER — Encounter (HOSPITAL_COMMUNITY): Payer: Medicare HMO | Admitting: Internal Medicine

## 2017-08-11 ENCOUNTER — Ambulatory Visit: Payer: Medicare HMO | Admitting: Cardiology

## 2017-08-11 ENCOUNTER — Telehealth: Payer: Self-pay | Admitting: Family Medicine

## 2017-08-11 ENCOUNTER — Encounter: Payer: Self-pay | Admitting: Cardiology

## 2017-08-11 VITALS — BP 120/60 | HR 60 | Ht 71.0 in | Wt 184.8 lb

## 2017-08-11 DIAGNOSIS — I2729 Other secondary pulmonary hypertension: Secondary | ICD-10-CM | POA: Diagnosis not present

## 2017-08-11 DIAGNOSIS — I428 Other cardiomyopathies: Secondary | ICD-10-CM

## 2017-08-11 DIAGNOSIS — I43 Cardiomyopathy in diseases classified elsewhere: Secondary | ICD-10-CM | POA: Diagnosis not present

## 2017-08-11 DIAGNOSIS — E854 Organ-limited amyloidosis: Secondary | ICD-10-CM | POA: Diagnosis not present

## 2017-08-11 DIAGNOSIS — I5042 Chronic combined systolic (congestive) and diastolic (congestive) heart failure: Secondary | ICD-10-CM

## 2017-08-11 MED ORDER — POTASSIUM CHLORIDE CRYS ER 20 MEQ PO TBCR: EXTENDED_RELEASE_TABLET | ORAL | 3 refills | Status: DC

## 2017-08-11 NOTE — Telephone Encounter (Signed)
Copied from [place] [phone]. Topic: Quick Communication - See Telephone Encounter >> Aug 11, 2017 11:53 AM Conception Chancy, NT wrote: CRM for notification. See Telephone encounter for: 08/11/17.  Patient states when he was seen in the hospital he was prescribe traZODone (DESYREL) 50 MG tablet. He would like to know can Dr. Ethelene Hal can refill this.   [id number] [place], [place] [address] [place] [address] Phone: [phone] Fax: [phone]

## 2017-08-11 NOTE — Patient Instructions (Signed)
NO CHANGE WITH CURRENT MEDICATIONS  CONTINUE SEEING DR BENSIMHON IN CLINIC , ONCE COMPLETED FOLLOW UP WITH DR HARDING.  Your physician wants you to follow-up in [place]. You will receive a reminder letter in the mail two months in advance. If you don't receive a letter, please call our office to schedule the follow-up appointment.    If you need a refill on your cardiac medications before your next appointment, please call your pharmacy.

## 2017-08-11 NOTE — Progress Notes (Signed)
PCP: [name], MD  Clinic Note: Chief Complaint  Patient presents with  . Follow-up    pt denies chest pains. pt does complain of SOB, and some swelling in legs  . Cardiomyopathy    NICM - ? related to amylodosis  . Congestive Heart Failure    no swelling - stable wgt.    HPI: Dillon Andrews is a 77 y.o. male with a PMH below who presents today for hospital follow-up for acute on chronic combined heart failure.   He has known cardiomyopathy (nonischemic by heart catheterization -- wild type TTR amyloidosis BUN of March he is) with EF somewhere between 35-45%. Systolic dysfunction. Has elevated pulmonary pressures with moderate severe TR. He has a history of nonsustained VT, intolerant of beta blocker and amiodarone.  Notably: Prior to his hospitalization last summer when he was found to have cardiomyopathy, he was able to play 18 holes of golf and had no symptoms of shortness of breath or fatigue.  He is never had any prolonged episodes of chest discomfort.   He was previously followed by Dr. Marijo File  For Cardiology. (Dr. Clydie Braun,) -- was enrolled in cardiac rehabilitation. -->  He was seeking to transfer care to [facility], and has requested to follow-up with me based on the recommendation of his daughter who is a patient of mine. Followed by Dr. Chase Caller for Pulmonary issues.  Dillon Andrews was last seen on May 04, 2017 to establish cardiology care here.  March 26, 2017 by Dr. Clydie Braun --> plan was to enroll in cardiac rehab.  He appeared to be euvolemic at that time.  Noted that nausea was improved after cholecystectomy.  No further's no further arrhythmias noted on monitor.  The amiodarone was discontinued.    Recent Hospitalizations:   July 02, 2017: Admitt information so she did not ed for pneumonia with some heart failure exacerbation. -- seen by Dr. [name] in consult  --Seen by Dr. [name] in post hospital follow-up on April 10.   Walking 100 feet without getting short of breath.  Lost 6 pounds, torsemide was reduced to 20 twice daily.  Sleeps on a wedge.  (NYHA class III.  Torsemide 20 twice daily, Coreg 6.25 twice daily, spironolactone 12.5 mg daily.  No ARB/Entresto due to CKD. -- converted to Torsemide 20 mg bid (1-2 in AM)  Studies Personally Reviewed - (if available, images/films reviewed: From Epic Chart or Care Everywhere)  2-D echo (TTE) March 2019: Severe LVH.  EF 25 to 30% with mild HK of the baseline after septal wall.  Moderate to severely dilated right atrium, moderately dilated RV.  His SPEP came back with m-spike 0.8% and he was referred to Dr Beryle Beams. UPEP with Bence Jones Protein positive; kappa type.   CT SPECT 06/2017 for Amyloidosis  Interval History: Dillon Andrews presents here today actually feeling quite well.  He just saw Dr. [name], and is now on stable regimen.  He takes his torsemide 2 tablets daily (with the obligatory potassium)..  He says he is sleeping much better since he started combination of Ambien and trazodone.  He seems to be tolerating his carvedilol dose quite well -the plan is to hold off on-> Entresto or ARB because of his CKD Lab Results  Component Value Date   CREATININE 1.71 (H) 07/23/2017   Thankfully, he seems to be relatively euvolemic.  Not describing any real symptoms of PND or orthopnea.  His weights have been relatively stable.  He indicated that yesterday his  weight was up and so he took extra dose of torsemide and is back down now.  Today he has not yet taken his torsemide because of not wanting to go to the bathroom while coming here. He is only sleeping on one pillow. The plan is for him to now start CHF cardiac rehab and to follow-up with Dr. [name] in the heart failure clinic. He has not had any symptoms of chest tightness or pressure with rest or exertion, and it appears that his cardiomyopathy may be related to amyloidosis.  Thankfully, he is not noting any  rapid irregular heartbeats or palpitations.  No syncope/near syncope or TIA/amaurosis fugax.  He does occasionally get a little bit of positional dizziness but otherwise is doing okay..  No sense of inability to get his heart rate up with activity to suggest chronotropic incompetence. No claudication.  ROS: A comprehensive was performed. Review of Systems  Constitutional: Negative for malaise/fatigue (Gradually improving.  Almost back to baseline).  HENT: Negative for nosebleeds.   Respiratory: Positive for shortness of breath (Still has exertional dyspnea but notably improved since his last discharge - ). Negative for cough.   Cardiovascular: Positive for leg swelling (Minimal.  Controlled).  Gastrointestinal: Positive for abdominal pain (Occasional mild RUQ discomfort). Negative for blood in stool and melena.       No longer really having the nausea and vomiting  Genitourinary: Negative for hematuria.  Musculoskeletal: Negative for falls, joint pain and myalgias.  Neurological: Negative for dizziness.  Psychiatric/Behavioral: Negative for depression and memory loss. The patient is not nervous/anxious and does not have insomnia.   All other systems reviewed and are negative.   I have reviewed and (if needed) personally updated the patient's problem list, medications, allergies, past medical and surgical history, social and family history.   Past Medical History:  Diagnosis Date  . Anemia 11/2016  . Arthritis    "some; hands/fingers" (06/25/2017)  . Atrial tachycardia ([place])   . Barrett's esophagus   . Basal cell carcinoma    "face, arms, hands" (06/25/2017)  . Chicken pox   . Cholelithiasis 11/2016   S/P LAP CHOLE  . Chronic combined systolic and diastolic CHF (congestive heart failure) ([place])   . CKD (chronic kidney disease), stage III ([place])   . Esophageal varices (HCC)    Secondary to NAFLD  . Family history of adverse reaction to anesthesia    "daughter's sat drop very low;  last OR had to be reintubated" (06/25/2017)  . GERD (gastroesophageal reflux disease)   . High cholesterol   . Hypertension   . Mitral regurgitation   . NAFLD (nonalcoholic fatty liver disease)   . NICM (nonischemic cardiomyopathy) ([place])    Echo 06/2017: Severe LVH.  EF 25 to 30% with mild HK of the baseline after septal wall.  Moderate to severely dilated right atrium, moderately dilated RV. (Concern fr ? amyloidosis).  . NSVT (nonsustained ventricular tachycardia) ([place])   . Pneumonia 09/2016  . RBBB   . Tricuspid regurgitation   . Type II diabetes mellitus ([place])     Past Surgical History:  Procedure Laterality Date  . BACK SURGERY    . BASAL CELL CARCINOMA EXCISION Right    temple; "had to do skin graft"  . CARDIAC CATHETERIZATION  09/2016   Minimal, non-occlusive CAD, EF 45% [facility])  . CARPAL TUNNEL RELEASE Bilateral   . CATARACT EXTRACTION W/ INTRAOCULAR LENS  IMPLANT, BILATERAL Bilateral   . CHOLECYSTECTOMY N/A 11/22/2016   Procedure:  LAPAROSCOPIC CHOLECYSTECTOMY;  Surgeon: Coralie Keens, MD;  Location: [facility];  Service: General;  Laterality: N/A;  . JOINT REPLACEMENT    . LUMBAR DISC SURGERY  X 2  . TONSILLECTOMY AND ADENOIDECTOMY    . TOTAL KNEE ARTHROPLASTY Left 2010  . TRANSESOPHAGEAL ECHOCARDIOGRAM  09/2016   Mild global LV hypokinesis. Moderate MR. Moderate TR. [facility])  . TRANSTHORACIC ECHOCARDIOGRAM  10/2016   [facility]) EF 35-40% with moderate HK. Normal RV function. Moderately severe TR.  Marland Kitchen TRANSTHORACIC ECHOCARDIOGRAM  03/2017; 06/2017    A) ([place]) mild concentric LVH. EF of 40-45%. Diffuse HK with no RWMA. GR 2 DD. Septal dyssynergy. Mild MR, moderate TR. Severe RA dilation..;; B) Severe LVH.  EF 25 to 30% with mild HK of Septal wall.  Mod-severely dilated RA & Mod dilated RV.     2-D echo (TTE) December 2018, Mild concentric LVH. Moderately reduced EF of 40-45%. Diffuse HK with no regional wall motion abnormality. GR  2 DD. Septal dyssynergy. Mild MR, moderate TR. Severe RA dilation..  Cardiac catheterization 09/30/2016: Mild nausea for CAD. EF 40-45%. Right heart cath showed unremarkable right heart pressures except for peak V wave of 19. - Moderate MR by TEE --> referred for TEE.  TTE July 2018: EF 35-40% with moderate HK. Normal RV function. Moderately severe TR.  TEE June 2018: Mild global LV hypokinesis. Moderate MR. Moderate TR.  Current Meds  Medication Sig  . aspirin EC 81 MG tablet Take 81 mg by mouth daily.  . carvedilol (COREG) 6.25 MG tablet TAKE 1 TABLET BY MOUTH TWICE A DAY WITH MEALS  . gabapentin (NEURONTIN) 800 MG tablet Take 1 tablet (800 mg total) by mouth at bedtime.  Marland Kitchen glipiZIDE (GLUCOTROL) 5 MG tablet Take 0.5 tablets (2.5 mg total) by mouth 2 (two) times daily.  Marland Kitchen glucose blood test strip Use as instructed  . omeprazole (PRILOSEC) 40 MG capsule Take 1 capsule (40 mg total) by mouth daily.  . potassium chloride SA (K-DUR,KLOR-CON) 20 MEQ tablet TAKE 1 TO 2 TABLET DAILY AS DIRECTED  . rosuvastatin (CRESTOR) 10 MG tablet Take 10 mg by mouth daily.  Marland Kitchen torsemide (DEMADEX) 20 MG tablet Take 20 mg by mouth 2 (two) times daily.   . vitamin B-12 (CYANOCOBALAMIN) 500 MCG tablet Take 500 mcg by mouth every other day.  . zolpidem (AMBIEN) 5 MG tablet Take 1 tablet (5 mg total) by mouth at bedtime as needed for sleep.  . [DISCONTINUED] potassium chloride SA (K-DUR,KLOR-CON) 20 MEQ tablet Take 1 tablet (20 mEq total) by mouth as directed.  . [DISCONTINUED] traZODone (DESYREL) 50 MG tablet Take 1 tablet (50 mg total) by mouth at bedtime.    No Known Allergies    Social History   Tobacco Use  . Smoking status: Former Smoker    Packs/day: 0.30    Years: 15.00    Pack years: 4.50    Types: Cigarettes    Last attempt to quit: 03/17/1977    Years since quitting: 40.4  . Smokeless tobacco: Never Used  Substance Use Topics  . Alcohol use: Yes    Comment: 06/25/2017 "nothing since 09/2016;   used to have glass of wine q hs"  . Drug use: No   Social History   Social History Narrative   He is retired from [employer].  Previously worked for [employer] by his daughter --> also a patient of Dr. Ellyn Hack   Lives with wife in a  one story home.    Education: high school.     family history includes Alcohol abuse in his brother; Autoimmune disease in his daughter; Breast cancer in his sister; Cancer in his father; Cataracts in his father; Heart failure in his mother; Liver disease in his brother.  Wt Readings from Last 3 Encounters:  08/11/17 184 lb 12.8 oz (83.8 kg)  07/24/17 180 lb (81.6 kg)  07/23/17 180 lb 8 oz (81.9 kg)    PHYSICAL EXAM BP 120/60 (BP Location: Left Arm)   Pulse 60   Ht 5' 11"  (1.803 m)   Wt 184 lb 12.8 oz (83.8 kg)   BMI 25.77 kg/m  Physical Exam  Constitutional: He is oriented to person, place, and time. He appears well-developed and well-nourished. No distress.  Healthy, euvolemic appearing.  Well-groomed.  HENT:  Head: Normocephalic and atraumatic.  Mouth/Throat: Oropharynx is clear and moist.  Eyes: EOM are normal.  Neck: Normal range of motion. Neck supple. No hepatojugular reflux and no JVD present. Carotid bruit is not present.  Cardiovascular: Normal rate, regular rhythm and S1 normal.  Occasional extrasystoles are present. PMI is not displaced. Exam reveals distant heart sounds. Exam reveals no gallop and no friction rub.  No murmur (Cannot exclude HSM along the left sternal border) heard. Split S2  Pulmonary/Chest: Effort normal.  Bibasilar diminished breath sounds, but no rhonchi.  No wheezing.  Abdominal: Soft. Bowel sounds are normal. He exhibits no distension (Does appear to be some mild abdominal swelling.). There is no tenderness. There is no rebound.  Musculoskeletal: Normal range of motion. He exhibits edema (Trivial pedal/ankle).  Neurological: He is alert and oriented to person, place, and time.    Psychiatric: He has a normal mood and affect. His behavior is normal. Judgment and thought content normal.  Nursing note and vitals reviewed.    Adult ECG Report Not checked  Other studies Reviewed: Additional studies/ records that were reviewed today include:  Recent Labs:   Lab Results  Component Value Date   CREATININE 1.71 (H) 07/23/2017   BUN 32 (H) 07/23/2017   NA 139 07/23/2017   K 4.4 07/23/2017   CL 95 (L) 07/23/2017   CO2 31 07/23/2017   No results found for: CHOL, HDL, LDLCALC, LDLDIRECT, TRIG, CHOLHDL . Lab Results  Component Value Date (12/04/2016) TC 150 ; HDL 42, LDL 92, TG 83   ASSESSMENT / PLAN: Problem List Items Addressed This Visit    Other secondary pulmonary hypertension ([place]) (Chronic)    Most recent echo now shows dilated RV.  I would not be surprised if he does have significant pulmonary retention.  Hard to assess JVD on exam consistent neck is somewhat stiff.  Current plan of action appears to be treatment of left-sided failure.      Non-ischemic cardiomyopathy ([place]) - Primary (Chronic)    Progressively worsening EF on recent echo down to 25-30% from 40-45%.  Now having baseline NYHA II- but more consistent with class III symptoms.  At present, he has now established in the heart failure clinic with Dr. Pierre Bali.  Suspicion is that this could be cardiac amyloidosis. In order to avoid confusion, I think is appropriate for him to continue to monitor and treated by Dr. [name] until the current plan of action is stabilized.  He is on relatively stable dose of carvedilol and torsemide.  Not on spironolactone (not sure if it is intended to be on or not, but not listed on meds) or ARB.  Converted from furosemide to torsemide during last hospital stay.      Chronic combined systolic and diastolic heart failure (HCC) (Chronic)    Class III symptoms at this point having had recent exacerbation.  But now relatively stable on current meds.  Per Dr.  [name]: - Continue torsemide 20 mg BID.  - Continue coreg 6.25 mg BID. - Continue spiro 12.5 mg daily (however spironolactone is not currently listed.) - No ARB/entresto with CKD       Cardiac amyloidosis [facility])    Diagnostic tests being ordered by Dr. [name].         Current medicines are reviewed at length with the patient today. (+/- concerns) none The following changes have been made:Yes.  See below  Patient Instructions  NO CHANGE WITH CURRENT MEDICATIONS  CONTINUE SEEING DR [place] , ONCE COMPLETED FOLLOW UP WITH DR Godwin Tedesco.  Your physician wants you to follow-up in [place]. You will receive a reminder letter in the mail two months in advance. If you don't receive a letter, please call our office to schedule the follow-up appointment.    If you need a refill on your cardiac medications before your next appointment, please call your pharmacy.   Studies Ordered:   No orders of the defined types were placed in this encounter.     Glenetta Hew, M.D., M.S. Interventional Cardiologist   Pager # [phone] Phone # [phone] [address] [place], [place] [address]   Thank you for choosing Heartcare at [facility]!!

## 2017-08-11 NOTE — Telephone Encounter (Signed)
Okay to fill? Patient received medication in the hospital.

## 2017-08-12 ENCOUNTER — Encounter: Payer: Self-pay | Admitting: Cardiology

## 2017-08-12 MED ORDER — TRAZODONE HCL 50 MG PO TABS: 50.0000 mg | ORAL_TABLET | Freq: Every day | ORAL | 2 refills | Status: DC

## 2017-08-12 NOTE — Telephone Encounter (Signed)
Trazodone and deseryl are different. I have already sent in an rx for ambien. Which works best for him?

## 2017-08-12 NOTE — Assessment & Plan Note (Signed)
Diagnostic tests being ordered by Dr. [name].

## 2017-08-12 NOTE — Assessment & Plan Note (Signed)
Class III symptoms at this point having had recent exacerbation.  But now relatively stable on current meds.  Per Dr. [name]: - Continue torsemide 20 mg BID.  - Continue coreg 6.25 mg BID. - Continue spiro 12.5 mg daily (however spironolactone is not currently listed.) - No ARB/entresto with CKD

## 2017-08-12 NOTE — Telephone Encounter (Signed)
Rx sent in

## 2017-08-12 NOTE — Assessment & Plan Note (Addendum)
Most recent echo now shows dilated RV.  I would not be surprised if he does have significant pulmonary retention.  Hard to assess JVD on exam consistent neck is somewhat stiff.  Current plan of action appears to be treatment of left-sided failure.

## 2017-08-12 NOTE — Assessment & Plan Note (Addendum)
Progressively worsening EF on recent echo down to 25-30% from 40-45%.  Now having baseline NYHA II- but more consistent with class III symptoms.  At present, he has now established in the heart failure clinic with Dr. Pierre Bali.  Suspicion is that this could be cardiac amyloidosis. In order to avoid confusion, I think is appropriate for him to continue to monitor and treated by Dr. [name] until the current plan of action is stabilized.  He is on relatively stable dose of carvedilol and torsemide.  Not on spironolactone (not sure if it is intended to be on or not, but not listed on meds) or ARB.  Converted from furosemide to torsemide during last hospital stay.

## 2017-08-15 ENCOUNTER — Other Ambulatory Visit: Payer: Self-pay | Admitting: Family Medicine

## 2017-08-15 NOTE — Telephone Encounter (Signed)
Plz see refill req/thx dmf

## 2017-08-15 NOTE — Telephone Encounter (Signed)
Last office visit 4.5.19 Patient would like a refill.. Please advise.

## 2017-08-18 ENCOUNTER — Ambulatory Visit (INDEPENDENT_AMBULATORY_CARE_PROVIDER_SITE_OTHER): Payer: Medicare HMO | Admitting: Family Medicine

## 2017-08-18 ENCOUNTER — Encounter: Payer: Self-pay | Admitting: Family Medicine

## 2017-08-18 VITALS — BP 110/68 | HR 89 | Ht 71.0 in | Wt 184.2 lb

## 2017-08-18 DIAGNOSIS — G4701 Insomnia due to medical condition: Secondary | ICD-10-CM | POA: Diagnosis not present

## 2017-08-18 DIAGNOSIS — R531 Weakness: Secondary | ICD-10-CM | POA: Insufficient documentation

## 2017-08-18 DIAGNOSIS — N183 Chronic kidney disease, stage 3 unspecified: Secondary | ICD-10-CM

## 2017-08-18 DIAGNOSIS — R5383 Other fatigue: Secondary | ICD-10-CM | POA: Diagnosis not present

## 2017-08-18 DIAGNOSIS — D649 Anemia, unspecified: Secondary | ICD-10-CM | POA: Insufficient documentation

## 2017-08-18 LAB — BASIC METABOLIC PANEL
BUN: 32 mg/dL — ABNORMAL HIGH (ref 6–23)
CHLORIDE: 100 meq/L (ref 96–112)
CO2: 36 meq/L — AB (ref 19–32)
Calcium: 9.3 mg/dL (ref 8.4–10.5)
Creatinine, Ser: 1.71 mg/dL — ABNORMAL HIGH (ref 0.40–1.50)
GFR: 41.36 mL/min — ABNORMAL LOW (ref >60.00)
Glucose, Bld: 104 mg/dL — ABNORMAL HIGH (ref 70–99)
POTASSIUM: 4.7 meq/L (ref 3.5–5.1)
SODIUM: 143 meq/L (ref 135–145)

## 2017-08-18 LAB — CBC
HEMATOCRIT: 38.6 % — AB (ref 39.0–52.0)
Hemoglobin: 12.3 g/dL — ABNORMAL LOW (ref 13.0–17.0)
MCHC: 31.9 g/dL (ref 30.0–36.0)
MCV: 89.6 fl (ref 78.0–100.0)
PLATELETS: 148 10*3/uL — AB (ref 150.0–400.0)
RBC: 4.3 Mil/uL (ref 4.22–5.81)
RDW: 22.5 % — AB (ref 11.5–15.5)
WBC: 7 10*3/uL (ref 4.0–10.5)

## 2017-08-18 NOTE — Progress Notes (Signed)
Subjective:  Patient ID: Dillon Andrews, male    DOB: 06-15-1940  Age: 77 y.o. MRN: [id number]  CC: Follow-up   HPI Dillon Andrews presents for follow-up in a routine check.  He says that his sleeping is as good as it ever been taking the Ambien with the trazodone.  He sometimes sleeps up to 8 hours at night.  He is noticing more fatigue than he used to.  He was able to play golf this past week but had to stop after 14 holes because of weakness in his legs.  He does not feel as though he needs the gabapentin anymore but and would like to see if he can do without it.  He would like my okay to hold it for now.  He is taking the Demadex in lieu of the Lasix.  He was advised to take a potassium pill with each Demadex pill.  Cardiology has advised him to take an extra Demadex with a weight gain of over 2 pounds in any given day.  He is scheduled for follow-up of his cardiac amyloidosis soon.  Cardiac rehab to start in June.  Outpatient Medications Prior to Visit  Medication Sig Dispense Refill  . aspirin EC 81 MG tablet Take 81 mg by mouth daily.    . carvedilol (COREG) 6.25 MG tablet TAKE 1 TABLET BY MOUTH TWICE A DAY WITH MEALS 180 tablet 0  . gabapentin (NEURONTIN) 800 MG tablet Take 1 tablet (800 mg total) by mouth at bedtime. 90 tablet 1  . glipiZIDE (GLUCOTROL) 5 MG tablet Take 0.5 tablets (2.5 mg total) by mouth 2 (two) times daily. 90 tablet 1  . glucose blood test strip Use as instructed 100 each 12  . omeprazole (PRILOSEC) 40 MG capsule Take 1 capsule (40 mg total) by mouth daily. 90 capsule 1  . potassium chloride SA (K-DUR,KLOR-CON) 20 MEQ tablet TAKE 1 TO 2 TABLET DAILY AS DIRECTED 180 tablet 3  . rosuvastatin (CRESTOR) 10 MG tablet TAKE 1 TABLET BY MOUTH ONCE DAILY 90 tablet 0  . torsemide (DEMADEX) 20 MG tablet Take 20 mg by mouth 2 (two) times daily.     . traZODone (DESYREL) 50 MG tablet Take 1 tablet (50 mg total) by mouth at bedtime. 30 tablet 2  . vitamin B-12  (CYANOCOBALAMIN) 500 MCG tablet Take 500 mcg by mouth every other day.    . zolpidem (AMBIEN) 5 MG tablet Take 1 tablet (5 mg total) by mouth at bedtime as needed for sleep. 30 tablet 2   No facility-administered medications prior to visit.     ROS Review of Systems  Constitutional: Positive for fatigue. Negative for chills and fever.  HENT: Negative.   Eyes: Negative.   Respiratory: Negative.  Negative for chest tightness and shortness of breath.   Cardiovascular: Positive for palpitations. Negative for chest pain.  Gastrointestinal: Negative.   Endocrine: Negative for polyphagia and polyuria.  Genitourinary: Negative.  Negative for decreased urine volume, difficulty urinating and hematuria.  Skin: Negative.   Neurological: Positive for weakness. Negative for headaches.  Hematological: Does not bruise/bleed easily.  Psychiatric/Behavioral: Negative.     Objective:  BP 110/68   Pulse 89   Ht 5' 11"  (1.803 m)   Wt 184 lb 4 oz (83.6 kg)   SpO2 97%   BMI 25.70 kg/m   BP Readings from Last 3 Encounters:  08/18/17 110/68  08/11/17 120/60  07/24/17 110/74    Wt Readings from Last 3 Encounters:  08/18/17 184 lb 4 oz (83.6 kg)  08/11/17 184 lb 12.8 oz (83.8 kg)  07/24/17 180 lb (81.6 kg)    Physical Exam  Constitutional: He is oriented to person, place, and time. He appears well-developed and well-nourished. No distress.  HENT:  Head: Normocephalic and atraumatic.  Right Ear: External ear normal.  Left Ear: External ear normal.  Nose: Nose normal.  Mouth/Throat: Oropharynx is clear and moist.  Eyes: Pupils are equal, round, and reactive to light. Conjunctivae and EOM are normal. Right eye exhibits no discharge. Left eye exhibits no discharge. No scleral icterus.  Neck: Normal range of motion. Neck supple. No JVD present. No tracheal deviation present. No thyromegaly present.  Cardiovascular: An irregularly irregular rhythm present.  Pulmonary/Chest: Effort normal and  breath sounds normal.  Musculoskeletal: He exhibits no edema.  Lymphadenopathy:    He has no cervical adenopathy.  Neurological: He is alert and oriented to person, place, and time.  Skin: Skin is warm and dry. No rash noted. He is not diaphoretic. No erythema. No pallor.  Psychiatric: He has a normal mood and affect. His behavior is normal.    Lab Results  Component Value Date   WBC 9.1 07/11/2017   HGB 12.1 (L) 07/11/2017   HCT 39.1 07/11/2017   PLT 158 07/11/2017   GLUCOSE 130 (H) 07/23/2017   ALT 30 07/10/2017   AST 40 07/10/2017   NA 139 07/23/2017   K 4.4 07/23/2017   CL 95 (L) 07/23/2017   CREATININE 1.71 (H) 07/23/2017   BUN 32 (H) 07/23/2017   CO2 31 07/23/2017   TSH 2.456 06/26/2017   INR 1.28 07/10/2017   HGBA1C 7.0 (H) 07/10/2017    No results found.  Assessment & Plan:   Dillon Andrews was seen today for follow-up.  Diagnoses and all orders for this visit:  Fatigue, unspecified type -     CBC -     Basic metabolic panel  CKD (chronic kidney disease) stage 3, GFR 30-59 ml/min (HCC) -     CBC -     Basic metabolic panel  Anemia, unspecified type -     CBC   I am having Dillon Andrews maintain his aspirin EC, vitamin B-12, gabapentin, glucose blood, glipiZIDE, torsemide, zolpidem, carvedilol, omeprazole, potassium chloride SA, traZODone, and rosuvastatin.  No orders of the defined types were placed in this encounter.  Believe the patient's leg weakness may be multifactorial.  Unfortunately he is declining ejection fraction and GFR part of the picture.  He will hold his gabapentin to see if that improves the strength in his legs.  I asked him to do the best that he could and try not to push himself and he agreed.  Follow-up in 6 weeks.  Follow-up: Return in about 6 weeks (around 09/29/2017).  [name], MD

## 2017-08-18 NOTE — Telephone Encounter (Signed)
rx sent

## 2017-08-25 ENCOUNTER — Telehealth: Payer: Self-pay

## 2017-08-25 NOTE — Telephone Encounter (Signed)
Pt calling to check status on receiving a callback regarding a call he received this morning to schedule at oncology and hematology office.

## 2017-08-25 NOTE — Telephone Encounter (Signed)
dw patient

## 2017-08-25 NOTE — Telephone Encounter (Signed)
Copied from [place] [id number]. Topic: Referral - Question >> Aug 25, 2017 12:38 PM Oliver Pila B wrote: Reason for CRM: pt's daughter called to asking what the referrals for oncology and hematology were for, call pt to advise

## 2017-09-15 ENCOUNTER — Encounter: Payer: Self-pay | Admitting: Hematology & Oncology

## 2017-09-15 ENCOUNTER — Other Ambulatory Visit: Payer: Self-pay | Admitting: Family

## 2017-09-15 ENCOUNTER — Ambulatory Visit (HOSPITAL_BASED_OUTPATIENT_CLINIC_OR_DEPARTMENT_OTHER)
Admission: RE | Admit: 2017-09-15 | Discharge: 2017-09-15 | Disposition: A | Discharge status: Home / Self Care | Payer: Medicare HMO | Source: Ambulatory Visit | Attending: Hematology & Oncology | Admitting: Hematology & Oncology

## 2017-09-15 ENCOUNTER — Inpatient Hospital Stay: Payer: Medicare HMO | Attending: Hematology & Oncology | Admitting: Hematology & Oncology

## 2017-09-15 ENCOUNTER — Inpatient Hospital Stay: Payer: Medicare HMO

## 2017-09-15 ENCOUNTER — Other Ambulatory Visit: Payer: Self-pay

## 2017-09-15 VITALS — BP 122/62 | HR 68 | Temp 98.4°F | Resp 18 | Wt 186.0 lb

## 2017-09-15 DIAGNOSIS — N183 Chronic kidney disease, stage 3 unspecified: Secondary | ICD-10-CM

## 2017-09-15 DIAGNOSIS — E8582 Wild-type transthyretin-related (ATTR) amyloidosis: Secondary | ICD-10-CM | POA: Insufficient documentation

## 2017-09-15 DIAGNOSIS — I509 Heart failure, unspecified: Secondary | ICD-10-CM | POA: Diagnosis not present

## 2017-09-15 DIAGNOSIS — E859 Amyloidosis, unspecified: Secondary | ICD-10-CM | POA: Diagnosis not present

## 2017-09-15 DIAGNOSIS — Z87891 Personal history of nicotine dependence: Secondary | ICD-10-CM | POA: Insufficient documentation

## 2017-09-15 LAB — CBC WITH DIFFERENTIAL (CANCER CENTER ONLY)
BASOS PCT: 1 %
Basophils Absolute: 0 10*3/uL (ref 0.0–0.1)
EOS ABS: 0.2 10*3/uL (ref 0.0–0.5)
Eosinophils Relative: 2 %
HEMATOCRIT: 38.8 % (ref 38.7–49.9)
HEMOGLOBIN: 12.2 g/dL — AB (ref 13.0–17.1)
Lymphocytes Relative: 20 %
Lymphs Abs: 1.3 10*3/uL (ref 0.9–3.3)
MCH: 28.9 pg (ref 28.0–33.4)
MCHC: 31.4 g/dL — AB (ref 32.0–35.9)
MCV: 91.9 fL (ref 82.0–98.0)
MONOS PCT: 16 %
Monocytes Absolute: 1 10*3/uL — ABNORMAL HIGH (ref 0.1–0.9)
NEUTROS ABS: 3.9 10*3/uL (ref 1.5–6.5)
NEUTROS PCT: 61 %
Platelet Count: 132 10*3/uL — ABNORMAL LOW (ref 145–400)
RBC: 4.22 MIL/uL (ref 4.20–5.70)
RDW: 20.8 % — ABNORMAL HIGH (ref 11.1–15.7)
WBC: 6.4 10*3/uL (ref 4.0–10.0)

## 2017-09-15 LAB — CMP (CANCER CENTER ONLY)
ALBUMIN: 3.8 g/dL (ref 3.5–5.0)
ALK PHOS: 178 U/L — AB (ref 40–150)
ALT: 30 U/L (ref 0–55)
ANION GAP: 10 (ref 3–11)
AST: 38 U/L — AB (ref 5–34)
BILIRUBIN TOTAL: 1.2 mg/dL (ref 0.2–1.2)
BUN: 28 mg/dL — AB (ref 7–26)
CALCIUM: 9.4 mg/dL (ref 8.4–10.4)
CO2: 31 mmol/L — AB (ref 22–29)
CREATININE: 1.64 mg/dL — AB (ref 0.70–1.30)
Chloride: 104 mmol/L (ref 98–109)
GFR, Est AFR Am: 45 mL/min — ABNORMAL LOW (ref >60)
GFR, Estimated: 38 mL/min — ABNORMAL LOW (ref >60)
GLUCOSE: 118 mg/dL (ref 70–140)
Potassium: 4.9 mmol/L (ref 3.5–5.1)
SODIUM: 145 mmol/L (ref 136–145)
TOTAL PROTEIN: 7.2 g/dL (ref 6.4–8.3)

## 2017-09-15 LAB — TECHNOLOGIST SMEAR REVIEW

## 2017-09-15 LAB — BRAIN NATRIURETIC PEPTIDE: B Natriuretic Peptide: 393 pg/mL — ABNORMAL HIGH (ref 0.0–100.0)

## 2017-09-15 NOTE — Progress Notes (Signed)
Referral MD  Reason for Referral: Cardiac amyloidosis  Chief Complaint  Patient presents with  . New Patient (Initial Visit)  : I have no idea why I am here.  HPI: [name] is a very nice 77 year old white male.  He used to work for [employer].  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  He comes in with his daughter.  Apparently, he was hospitalized back in March with what appear to be heart failure.  He was seen by cardiology.  He had an echocardiogram done.  This apparently seem to show severe left ventricular hypertrophy.  His ejection fraction was only 25-30%.  He had a moderately dilated right ventricle with normal wall thickness.  His atrium was moderately to severely dilated.  There was some concern about him having amyloidosis.  I am not sure how this was figured out.  He had a urine that was checked.  This is not a 24-hour urine.  This appeared to show some kappa light chains.  He had a cardiac scan.  According to the radiologist report, the findings are strongly suggestive of transthyretin amyloidosis.  He had a SPEP done.  This showed an M spike of 0.8 g/dL.  No other studies were done regarding this.  He was mildly anemic at the time.  He has mild renal insufficiency.  He was told that he is always had elevated liver function studies.  He had his gallbladder taken out back in August 2018.  This showed cholecystitis.  An ultrasound that was done at the time showed a normal liver.  He was sent to the [place] office for this amyloidosis evaluation.  He is eating okay.  He has some weakness in his legs.  He is taking a protein supplement.  He has had no problems swallowing.  He has had some back issues but he has had back surgery.  He has had left knee surgery.  He is never smoked.  He really does not drink any alcohol.  There is no history of blood problems in the family.  There is no actual cardiac issues in the family.  He said that his mother had heart disease  but I think she was in her 24s.  Overall, I said his performance status is ECOG 1.    Past Medical History:  Diagnosis Date  . Anemia 11/2016  . Arthritis    "some; hands/fingers" (06/25/2017)  . Atrial tachycardia ([place])   . Barrett's esophagus   . Basal cell carcinoma    "face, arms, hands" (06/25/2017)  . Chicken pox   . Cholelithiasis 11/2016   S/P LAP CHOLE  . Chronic combined systolic and diastolic CHF (congestive heart failure) ([place])   . CKD (chronic kidney disease), stage III ([place])   . Esophageal varices (HCC)    Secondary to NAFLD  . Family history of adverse reaction to anesthesia    "daughter's sat drop very low; last OR had to be reintubated" (06/25/2017)  . GERD (gastroesophageal reflux disease)   . High cholesterol   . Hypertension   . Mitral regurgitation   . NAFLD (nonalcoholic fatty liver disease)   . NICM (nonischemic cardiomyopathy) ([place])    Echo 06/2017: Severe LVH.  EF 25 to 30% with mild HK of the baseline after septal wall.  Moderate to severely dilated right atrium, moderately dilated RV. (Concern fr ? amyloidosis).  . NSVT (nonsustained ventricular tachycardia) ([place])   . Pneumonia 09/2016  . RBBB   . Tricuspid regurgitation   .  Type II diabetes mellitus ([place])   :  Past Surgical History:  Procedure Laterality Date  . BACK SURGERY    . BASAL CELL CARCINOMA EXCISION Right    temple; "had to do skin graft"  . CARDIAC CATHETERIZATION  09/2016   Minimal, non-occlusive CAD, EF 45% [facility])  . CARPAL TUNNEL RELEASE Bilateral   . CATARACT EXTRACTION W/ INTRAOCULAR LENS  IMPLANT, BILATERAL Bilateral   . CHOLECYSTECTOMY N/A 11/22/2016   Procedure: LAPAROSCOPIC CHOLECYSTECTOMY;  Surgeon: Coralie Keens, MD;  Location: [facility];  Service: General;  Laterality: N/A;  . JOINT REPLACEMENT    . LUMBAR DISC SURGERY  X 2  . TONSILLECTOMY AND ADENOIDECTOMY    . TOTAL KNEE ARTHROPLASTY Left 2010  . TRANSESOPHAGEAL ECHOCARDIOGRAM  09/2016   Mild global  LV hypokinesis. Moderate MR. Moderate TR. [facility])  . TRANSTHORACIC ECHOCARDIOGRAM  10/2016   [facility]) EF 35-40% with moderate HK. Normal RV function. Moderately severe TR.  Marland Kitchen TRANSTHORACIC ECHOCARDIOGRAM  03/2017; 06/2017    A) () mild concentric LVH. EF of 40-45%. Diffuse HK with no RWMA. GR 2 DD. Septal dyssynergy. Mild MR, moderate TR. Severe RA dilation..;; B) Severe LVH.  EF 25 to 30% with mild HK of Septal wall.  Mod-severely dilated RA & Mod dilated RV.  :   Current Outpatient Medications:  .  aspirin EC 81 MG tablet, Take 81 mg by mouth daily., Disp: , Rfl:  .  carvedilol (COREG) 6.25 MG tablet, TAKE 1 TABLET BY MOUTH TWICE A DAY WITH MEALS, Disp: 180 tablet, Rfl: 0 .  gabapentin (NEURONTIN) 800 MG tablet, Take 1 tablet (800 mg total) by mouth at bedtime., Disp: 90 tablet, Rfl: 1 .  glipiZIDE (GLUCOTROL) 5 MG tablet, Take 0.5 tablets (2.5 mg total) by mouth 2 (two) times daily., Disp: 90 tablet, Rfl: 1 .  glucose blood test strip, Use as instructed, Disp: 100 each, Rfl: 12 .  omeprazole (PRILOSEC) 40 MG capsule, Take 1 capsule (40 mg total) by mouth daily., Disp: 90 capsule, Rfl: 1 .  potassium chloride SA (K-DUR,KLOR-CON) 20 MEQ tablet, TAKE 1 TO 2 TABLET DAILY AS DIRECTED, Disp: 180 tablet, Rfl: 3 .  rosuvastatin (CRESTOR) 10 MG tablet, TAKE 1 TABLET BY MOUTH ONCE DAILY, Disp: 90 tablet, Rfl: 0 .  torsemide (DEMADEX) 20 MG tablet, Take 20 mg by mouth 2 (two) times daily. , Disp: , Rfl:  .  traZODone (DESYREL) 50 MG tablet, Take 1 tablet (50 mg total) by mouth at bedtime., Disp: 30 tablet, Rfl: 2 .  vitamin B-12 (CYANOCOBALAMIN) 500 MCG tablet, Take 500 mcg by mouth every other day., Disp: , Rfl:  .  zolpidem (AMBIEN) 5 MG tablet, Take 1 tablet (5 mg total) by mouth at bedtime as needed for sleep., Disp: 30 tablet, Rfl: 2:  :  No Known Allergies:  Family History  Problem Relation Age of Onset  . Heart failure Mother   . Cancer Father   .  Cataracts Father   . Breast cancer Sister   . Alcohol abuse Brother   . Liver disease Brother   . Autoimmune disease Daughter        Bechet's syndrome  . Cirrhosis Neg Hx   :  Social History   Socioeconomic History  . Marital status: Married    Spouse name: Not on file  . Number of children: 4  . Years of education: 64  . Highest education level: Not on file  Occupational History  .  Occupation: retired  Scientific laboratory technician  . Financial resource strain: Not on file  . Food insecurity:    Worry: Not on file    Inability: Not on file  . Transportation needs:    Medical: Not on file    Non-medical: Not on file  Tobacco Use  . Smoking status: Former Smoker    Packs/day: 0.30    Years: 15.00    Pack years: 4.50    Types: Cigarettes    Last attempt to quit: 03/17/1977    Years since quitting: 40.5  . Smokeless tobacco: Never Used  Substance and Sexual Activity  . Alcohol use: Yes    Comment: 06/25/2017 "nothing since 09/2016;  used to have glass of wine q hs"  . Drug use: No  . Sexual activity: Not on file  Lifestyle  . Physical activity:    Days per week: Not on file    Minutes per session: Not on file  . Stress: Not on file  Relationships  . Social connections:    Talks on phone: Not on file    Gets together: Not on file    Attends religious service: Not on file    Active member of club or organization: Not on file    Attends meetings of clubs or organizations: Not on file    Relationship status: Not on file  . Intimate partner violence:    Fear of current or ex partner: Not on file    Emotionally abused: Not on file    Physically abused: Not on file    Forced sexual activity: Not on file  Other Topics Concern  . Not on file  Social History Narrative   He is retired from [employer].  Previously worked for [employer] by his daughter --> also a patient of Dr. Ellyn Hack   Lives with wife in a one story home.    Education: high school.   :  Review  of Systems  Constitutional: Positive for malaise/fatigue and weight loss.  HENT: Negative.   Eyes: Negative.   Respiratory: Positive for shortness of breath.   Cardiovascular: Positive for palpitations and leg swelling.  Gastrointestinal: Negative.   Genitourinary: Negative.   Musculoskeletal: Positive for back pain and joint pain.  Skin: Negative.   Neurological: Negative.   Endo/Heme/Allergies: Negative.   Psychiatric/Behavioral: Negative.      Exam: Elderly white male in no obvious distress.  Vital signs show a temperature of 98.7.  Pulse 68.  Blood pressure 122/62.  Weight is 186 pounds.  Head neck exam shows no ocular or oral lesions.  There are no palpable cervical or supraclavicular lymph nodes.  There is no tongue enlargement.  Lungs are with some decreased breath sounds throughout both lung fields.  Cardiac exam regular rate and rhythm with no obvious murmurs, rubs or bruits.  Abdomen is soft.  He has decent bowel sounds.  There is no fluid wave.  There is no palpable liver or spleen tip.  Back exam shows the surgical scar in the lumbar spine consistent with his laminectomy.  Extremities shows no clubbing, cyanosis or edema.  He has decent range of motion of his joints.  Skin exam shows some sun damage on his forearms.  No suspicious lesions are noted on the skin.  Neurological exam shows no obvious neurological deficits. @IPVITALS @   Recent Labs    09/15/17 1156  WBC 6.4  HGB 12.2*  HCT 38.8  PLT 132*   No results for  input(s): NA, K, CL, CO2, GLUCOSE, BUN, CREATININE, CALCIUM in the last 72 hours.  Blood smear review: None  Pathology: None    Assessment and Plan: Mr. Mcmanamon is a 77 year old white male.  He has congestive heart failure.  I think the question is whether or not he has light chain amyloidosis.  By the nuclear medicine scan he has, the radiologist feels that he has transthyretin amyloidosis which is a whole different condition than light chain amyloidosis.   We do not treat transthyretin amyloidosis.  I really need to get proof of amyloidosis that is light chain in etiology.  We will set him up with a bone marrow biopsy.  Of note he had this cholecystectomy done last year.  I will see if pathology can run that specimen for amyloid.  I will send of a 24-hour urine on him to see what kind of light chains he is excreting.  If he does have light chain amyloidosis, we will treat him with a myeloma like regimen.  This is a fascinating case.  I spent a good hour with Mr. Baranek and his daughter.  They are very very nice.  I explained to them what I thought what might be going on.  I answered all their questions.  I spent over half the time with them.  I will plan to get him back to see me once we have the bone marrow results back in his 24-hour urine.

## 2017-09-16 ENCOUNTER — Telehealth: Payer: Self-pay | Admitting: *Deleted

## 2017-09-16 ENCOUNTER — Telehealth (HOSPITAL_COMMUNITY): Payer: Self-pay | Admitting: Pharmacist

## 2017-09-16 LAB — IGG, IGA, IGM
IGA: 207 mg/dL (ref 61–437)
IGG (IMMUNOGLOBIN G), SERUM: 900 mg/dL (ref 700–1600)
IGM (IMMUNOGLOBULIN M), SRM: 578 mg/dL — AB (ref 15–143)

## 2017-09-16 LAB — KAPPA/LAMBDA LIGHT CHAINS
KAPPA, LAMDA LIGHT CHAIN RATIO: 2.98 — AB (ref 0.26–1.65)
Kappa free light chain: 76.1 mg/L — ABNORMAL HIGH (ref 3.3–19.4)
Lambda free light chains: 25.5 mg/L (ref 5.7–26.3)

## 2017-09-16 LAB — ERYTHROPOIETIN: Erythropoietin: 57.7 m[IU]/mL — ABNORMAL HIGH (ref 2.6–18.5)

## 2017-09-16 NOTE — Telephone Encounter (Addendum)
Patient is aware of results.  ----- Message from Volanda Napoleon, MD sent at 09/15/2017  6:33 PM EDT ----- Call - the bones look ok!! No amyloid in the bones!!  Laurey Arrow

## 2017-09-16 NOTE — Telephone Encounter (Signed)
Cardiac Rehab Medication Review by a Pharmacist  Does the patient  feel that his/her medications are working for him/her?  yes  Has the patient been experiencing any side effects to the medications prescribed?  no  Does the patient measure his/her own blood pressure or blood glucose at home?  Yes, BP normally low 100s/60s (pt said right side of heart not doing so well, and will see Dr. [name]), blood sugar low 100s - appetite comes and goes, and weight is around 184 pounds (states he gained a pound as of yesterday) but he is down 20 pounds since gallbladder surgery (11/2016).    Does the patient have any problems obtaining medications due to transportation or finances?   no  Understanding of regimen: good Understanding of indications: good Potential of compliance: good   Pharmacist comments: Patient has no other questions or concerns, but states that he wants them to take it slow at Cardiac rehab because his muscles are weak. Also, he started drinking supreme protein shakes (49m) recommended by RN that came into daughter's work. Patient feels like it is working very well for him.   TNida Boatman PharmD PGY1 Acute Care Pharmacy Resident 09/16/2017 3:41 PM

## 2017-09-17 DIAGNOSIS — E8582 Wild-type transthyretin-related (ATTR) amyloidosis: Secondary | ICD-10-CM | POA: Diagnosis not present

## 2017-09-17 DIAGNOSIS — I509 Heart failure, unspecified: Secondary | ICD-10-CM | POA: Diagnosis not present

## 2017-09-17 DIAGNOSIS — N183 Chronic kidney disease, stage 3 (moderate): Secondary | ICD-10-CM | POA: Diagnosis not present

## 2017-09-17 DIAGNOSIS — Z87891 Personal history of nicotine dependence: Secondary | ICD-10-CM | POA: Diagnosis not present

## 2017-09-18 ENCOUNTER — Encounter (HOSPITAL_COMMUNITY)
Admission: RE | Admit: 2017-09-18 | Discharge: 2017-09-18 | Disposition: A | Discharge status: Home / Self Care | Payer: Medicare HMO | Source: Ambulatory Visit | Attending: Internal Medicine | Admitting: Internal Medicine

## 2017-09-18 ENCOUNTER — Encounter (HOSPITAL_COMMUNITY): Payer: Self-pay

## 2017-09-18 VITALS — BP 104/62 | HR 77 | Ht 69.0 in | Wt 187.2 lb

## 2017-09-18 DIAGNOSIS — I130 Hypertensive heart and chronic kidney disease with heart failure and stage 1 through stage 4 chronic kidney disease, or unspecified chronic kidney disease: Secondary | ICD-10-CM | POA: Insufficient documentation

## 2017-09-18 DIAGNOSIS — E119 Type 2 diabetes mellitus without complications: Secondary | ICD-10-CM | POA: Insufficient documentation

## 2017-09-18 DIAGNOSIS — Z79891 Long term (current) use of opiate analgesic: Secondary | ICD-10-CM | POA: Insufficient documentation

## 2017-09-18 DIAGNOSIS — I5042 Chronic combined systolic (congestive) and diastolic (congestive) heart failure: Secondary | ICD-10-CM | POA: Insufficient documentation

## 2017-09-18 DIAGNOSIS — N183 Chronic kidney disease, stage 3 (moderate): Secondary | ICD-10-CM | POA: Insufficient documentation

## 2017-09-18 DIAGNOSIS — I428 Other cardiomyopathies: Secondary | ICD-10-CM | POA: Insufficient documentation

## 2017-09-18 DIAGNOSIS — Z79899 Other long term (current) drug therapy: Secondary | ICD-10-CM | POA: Insufficient documentation

## 2017-09-18 DIAGNOSIS — K760 Fatty (change of) liver, not elsewhere classified: Secondary | ICD-10-CM | POA: Insufficient documentation

## 2017-09-18 DIAGNOSIS — Z7982 Long term (current) use of aspirin: Secondary | ICD-10-CM | POA: Insufficient documentation

## 2017-09-18 DIAGNOSIS — Z7984 Long term (current) use of oral hypoglycemic drugs: Secondary | ICD-10-CM | POA: Insufficient documentation

## 2017-09-18 DIAGNOSIS — K219 Gastro-esophageal reflux disease without esophagitis: Secondary | ICD-10-CM | POA: Insufficient documentation

## 2017-09-18 LAB — PROTEIN ELECTROPHORESIS, SERUM, WITH REFLEX
A/G RATIO SPE: 1.1 (ref 0.7–1.7)
ALPHA-2-GLOBULIN: 0.6 g/dL (ref 0.4–1.0)
Albumin ELP: 3.3 g/dL (ref 2.9–4.4)
Alpha-1-Globulin: 0.3 g/dL (ref 0.0–0.4)
Beta Globulin: 0.9 g/dL (ref 0.7–1.3)
GLOBULIN, TOTAL: 3 g/dL (ref 2.2–3.9)
Gamma Globulin: 1.2 g/dL (ref 0.4–1.8)
M-SPIKE, %: 0.5 g/dL — AB
SPEP INTERP: 0
TOTAL PROTEIN ELP: 6.3 g/dL (ref 6.0–8.5)

## 2017-09-18 LAB — UPEP/UIFE/LIGHT CHAINS/TP, 24-HR UR
% BETA, Urine: 13.4 %
ALBUMIN, U: 67.1 %
ALPHA 1 URINE: 1.9 %
ALPHA 2 UR: 7.6 %
FREE KAPPA/LAMBDA RATIO: 19.85 — AB (ref 2.04–10.37)
Free Kappa Lt Chains,Ur: 107 mg/L — ABNORMAL HIGH (ref 1.35–24.19)
Free Lambda Lt Chains,Ur: 5.39 mg/L (ref 0.24–6.66)
GAMMA GLOBULIN URINE: 9.9 %
M-SPIKE %, Urine: 4.9 % — ABNORMAL HIGH
M-SPIKE, MG/24 HR: 17 mg/(24.h) — AB
Total Protein, Urine-Ur/day: 347 mg/24 hr — ABNORMAL HIGH (ref 30–150)
Total Protein, Urine: 17.8 mg/dL
Total Volume: 1950

## 2017-09-18 LAB — IMMUNOFIXATION REFLEX, SERUM
IGA: 211 mg/dL (ref 61–437)
IGM (IMMUNOGLOBULIN M), SRM: 590 mg/dL — AB (ref 15–143)
IgG (Immunoglobin G), Serum: 884 mg/dL (ref 700–1600)

## 2017-09-18 NOTE — Progress Notes (Signed)
Dillon Andrews 77 y.o. male DOB: 04-23-1940 MRN: [id number]      Nutrition Note  Dx: CHF Past Medical History:  Diagnosis Date  . Anemia 11/2016  . Arthritis    "some; hands/fingers" (06/25/2017)  . Atrial tachycardia ([place])   . Barrett's esophagus   . Basal cell carcinoma    "face, arms, hands" (06/25/2017)  . Chicken pox   . Cholelithiasis 11/2016   S/P LAP CHOLE  . Chronic combined systolic and diastolic CHF (congestive heart failure) ([place])   . CKD (chronic kidney disease), stage III ([place])   . Esophageal varices (HCC)    Secondary to NAFLD  . Family history of adverse reaction to anesthesia    "daughter's sat drop very low; last OR had to be reintubated" (06/25/2017)  . GERD (gastroesophageal reflux disease)   . High cholesterol   . Hypertension   . Mitral regurgitation   . NAFLD (nonalcoholic fatty liver disease)   . NICM (nonischemic cardiomyopathy) ([place])    Echo 06/2017: Severe LVH.  EF 25 to 30% with mild HK of the baseline after septal wall.  Moderate to severely dilated right atrium, moderately dilated RV. (Concern fr ? amyloidosis).  . NSVT (nonsustained ventricular tachycardia) ([place])   . Pneumonia 09/2016  . RBBB   . Tricuspid regurgitation   . Type II diabetes mellitus ([place])    Meds reviewed. Glipizide noted  HT: Ht Readings from Last 1 Encounters:  08/18/17 5' 11"  (1.803 m)    WT: Wt Readings from Last 20 Encounters:  09/15/17 186 lb (84.4 kg)  08/18/17 184 lb 4 oz (83.6 kg)  08/11/17 184 lb 12.8 oz (83.8 kg)  07/24/17 180 lb (81.6 kg)  07/23/17 180 lb 8 oz (81.9 kg)  07/18/17 179 lb 8 oz (81.4 kg)  07/13/17 182 lb 12.8 oz (82.9 kg)  07/08/17 193 lb 8 oz (87.8 kg)  06/29/17 180 lb (81.6 kg)  06/06/17 189 lb (85.7 kg)  05/12/17 186 lb 3.2 oz (84.5 kg)  05/05/17 185 lb (83.9 kg)  04/03/17 189 lb (85.7 kg)  03/30/17 179 lb 9.6 oz (81.5 kg)  03/17/17 199 lb 9.6 oz (90.5 kg)  03/05/17 200 lb 6 oz (90.9 kg)  11/28/16 183 lb 4.8 oz (83.1 kg)    BMI  25.94  Current tobacco use? No   Labs:  Lipid Panel  No results found for: CHOL, TRIG, HDL, CHOLHDL, VLDL, LDLCALC, LDLDIRECT  Lab Results  Component Value Date   HGBA1C 7.0 (H) 07/10/2017   CBG (last 3)  No results for input(s): GLUCAP in the last 72 hours.  Nutrition Note Spoke with pt and pt's daughter. Nutrition plan and goals reviewed with pt. Pt is following Step 2 of the Therapeutic Lifestyle Changes diet. Pt states he lost wt after his gallbladder surgery in 11/2016 (UBW reportedly 205 lb). Pt had a 10.7% decrease in wt from UBW after gallbladder surgery. Lowest post-op wt reportedly 176-177 lb. Pt c/o difficulty gaining wt, but has gained ~ 9-10 lb over the past 10 months (5.7% increase). Pt wants to weigh ~ 190 lb. Pt c/o continued decreased appetite and frequent "queeziness." Pt is drinking 1 Premier protein shake daily. Pt reports his is pre-diabetic despite taking Glipizide. The role of Glipizide in managing DM discussed as well as the risk for hypoglycemia. Last A1c indicates blood glucose well-controlled for pt's age.  Age-appropriate nutrition recommendations discussed. Pt expressed understanding of the information reviewed. Pt aware of nutrition education classes offered.  Nutrition Diagnosis ?  Food-and nutrition-related knowledge deficit related to lack of exposure to information as related to diagnosis of: ? CVD ? DM  Nutrition Intervention ? Pt's individual nutrition plan and goals reviewed with pt. ? Pt to add an additional protein shake if he does not eat protein at a meal (e.g. Pt ate a banana and jelly sandwich for lunch today). ? Pt given handouts for: ? Nutrition I class ? Nutrition II class ? Diabetes Blitz Class ? Suggestions for increasing kcal and protein intake  Nutrition Goal(s):  ? Pt to identify food quantities necessary to achieve weight gain of ~4 lb at graduation from cardiac rehab. Goal wt of 190 lb desired.   Plan:  Pt to attend nutrition classes ?  Portion Distortion ? Diabetes Q & A Will provide client-centered nutrition education as part of interdisciplinary care.   Monitor and evaluate progress toward nutrition goal with team.  Derek Mound, M.Ed, RD, LDN, CDE 09/18/2017 4:09 PM

## 2017-09-18 NOTE — Progress Notes (Signed)
Cardiac Individual Treatment Plan  Patient Details  Name: Dillon Andrews MRN: [id number] Date of Birth: Sep 04, 1940 Referring Provider:     CARDIAC REHAB PHASE II ORIENTATION from 09/18/2017 in [place]  Referring Provider  Bensimhon, Shaune Pascal MD       Initial Encounter Date:    CARDIAC REHAB PHASE II ORIENTATION from 09/18/2017 in [place]  Date  09/18/17  Referring Provider  Bensimhon, Shaune Pascal MD       Visit Diagnosis: Heart failure, systolic and diastolic, chronic ([place])  Patient's Home Medications on Admission:  Current Outpatient Medications:  .  aspirin EC 81 MG tablet, Take 81 mg by mouth daily., Disp: , Rfl:  .  carvedilol (COREG) 6.25 MG tablet, TAKE 1 TABLET BY MOUTH TWICE A DAY WITH MEALS, Disp: 180 tablet, Rfl: 0 .  gabapentin (NEURONTIN) 800 MG tablet, Take 1 tablet (800 mg total) by mouth at bedtime., Disp: 90 tablet, Rfl: 1 .  glipiZIDE (GLUCOTROL) 5 MG tablet, Take 0.5 tablets (2.5 mg total) by mouth 2 (two) times daily., Disp: 90 tablet, Rfl: 1 .  glucose blood test strip, Use as instructed, Disp: 100 each, Rfl: 12 .  magnesium oxide (MAG-OX) 400 MG tablet, Take 400 mg by mouth daily., Disp: , Rfl:  .  omeprazole (PRILOSEC) 40 MG capsule, Take 1 capsule (40 mg total) by mouth daily., Disp: 90 capsule, Rfl: 1 .  potassium chloride SA (K-DUR,KLOR-CON) 20 MEQ tablet, TAKE 1 TO 2 TABLET DAILY AS DIRECTED, Disp: 180 tablet, Rfl: 3 .  Probiotic Product (PROBIOTIC DAILY PO), Take 1 tablet by mouth., Disp: , Rfl:  .  rosuvastatin (CRESTOR) 10 MG tablet, TAKE 1 TABLET BY MOUTH ONCE DAILY, Disp: 90 tablet, Rfl: 0 .  torsemide (DEMADEX) 20 MG tablet, Take 20 mg by mouth 2 (two) times daily. , Disp: , Rfl:  .  traZODone (DESYREL) 50 MG tablet, Take 1 tablet (50 mg total) by mouth at bedtime., Disp: 30 tablet, Rfl: 2 .  vitamin B-12 (CYANOCOBALAMIN) 500 MCG tablet, Take 500 mcg by mouth every other day., Disp: ,  Rfl:  .  zolpidem (AMBIEN) 5 MG tablet, Take 1 tablet (5 mg total) by mouth at bedtime as needed for sleep., Disp: 30 tablet, Rfl: 2  Past Medical History: Past Medical History:  Diagnosis Date  . Anemia 11/2016  . Arthritis    "some; hands/fingers" (06/25/2017)  . Atrial tachycardia ([place])   . Barrett's esophagus   . Basal cell carcinoma    "face, arms, hands" (06/25/2017)  . Chicken pox   . Cholelithiasis 11/2016   S/P LAP CHOLE  . Chronic combined systolic and diastolic CHF (congestive heart failure) ([place])   . CKD (chronic kidney disease), stage III ([place])   . Esophageal varices (H[facility])    Secondary to NAFLD  . Family history of adverse reaction to anesthesia    "daughter's sat drop very low; last OR had to be reintubated" (06/25/2017)  . GERD (gastroesophageal reflux disease)   . High cholesterol   . Hypertension   . Mitral regurgitation   . NAFLD (nonalcoholic fatty liver disease)   . NICM (nonischemic cardiomyopathy) ([place])    Echo 06/2017: Severe LVH.  EF 25 to 30% with mild HK of the baseline after septal wall.  Moderate to severely dilated right atrium, moderately dilated RV. (Concern fr ? amyloidosis).  . NSVT (nonsustained ventricular tachycardia) ([place])   . Pneumonia 09/2016  . RBBB   .  Tricuspid regurgitation   . Type II diabetes mellitus (H[facility])     Tobacco Use: Social History   Tobacco Use  Smoking Status Former Smoker  . Packs/day: 0.30  . Years: 15.00  . Pack years: 4.50  . Types: Cigarettes  . Last attempt to quit: 03/17/1977  . Years since quitting: 40.5  Smokeless Tobacco Never Used    Labs: Recent Chemical engineer    Labs for ITP Cardiac and Pulmonary Rehab Latest Ref Rng & Units 06/30/2008 11/25/2016 03/27/2017 07/10/2017   Hemoglobin A1c 4.8 - 5.6 % - - 6.9(H) 7.0(H)   PHART 7.350 - 7.450 - 7.465(H) - -   PCO2ART 32.0 - 48.0 mmHg - 45.7 - -   HCO3 20.0 - 28.0 mmol/L - 32.5(H) - -   TCO2 0 - 100 mmol/L 25 - - -   O2SAT % - 95.6 - -       Capillary Blood Glucose: Lab Results  Component Value Date   GLUCAP 158 (H) 07/13/2017   GLUCAP 108 (H) 07/13/2017   GLUCAP 127 (H) 07/12/2017   GLUCAP 134 (H) 07/12/2017   GLUCAP 130 (H) 07/12/2017     Exercise Target Goals: Date: 09/18/17  Exercise Program Goal: Individual exercise prescription set using results from initial 6 min walk test and THRR while considering  patient's activity barriers and safety.   Exercise Prescription Goal: Initial exercise prescription builds to 30-45 minutes a day of aerobic activity, 2-3 days per week.  Home exercise guidelines will be given to patient during program as part of exercise prescription that the participant will acknowledge.  Activity Barriers & Risk Stratification: Activity Barriers & Cardiac Risk Stratification - 09/18/17 1620      Activity Barriers & Cardiac Risk Stratification   Activity Barriers  Muscular Weakness    Cardiac Risk Stratification  High       6 Minute Walk: 6 Minute Walk    Row Name 09/18/17 1619         6 Minute Walk   Phase  Initial     Distance  900 feet     Walk Time  6 minutes     # of Rest Breaks  0     MPH  1.7     METS  1.57     RPE  13     Perceived Dyspnea   0     VO2 Peak  5.51     Symptoms  Yes (comment)     Comments  Leg Fatigue +8     Resting HR  77 bpm     Resting BP  104/62     Resting Oxygen Saturation   96 %     Exercise Oxygen Saturation  during 6 min walk  96 %     Max Ex. HR  86 bpm     Max Ex. BP  118/64     2 Minute Post BP  102/60        Oxygen Initial Assessment:   Oxygen Re-Evaluation:   Oxygen Discharge (Final Oxygen Re-Evaluation):   Initial Exercise Prescription: Initial Exercise Prescription - 09/18/17 1600      Date of Initial Exercise RX and Referring Provider   Date  09/18/17    Referring Provider  Bensimhon, Shaune Pascal MD       Recumbant Bike   Level  1    Watts  10    Minutes  10    METs  1.5  NuStep   Level  1    SPM  75     Minutes  10    METs  1.4      Track   Laps  6    Minutes  10    METs  2      Prescription Details   Frequency (times per week)  3x    Duration  Progress to 30 minutes of continuous aerobic without signs/symptoms of physical distress      Intensity   THRR 40-80% of Max Heartrate  54-114    Ratings of Perceived Exertion  11-13    Perceived Dyspnea  0-4      Progression   Progression  Continue progressive overload as per policy without signs/symptoms or physical distress.      Resistance Training   Training Prescription  Yes    Weight  3lbs    Reps  10-15       Perform Capillary Blood Glucose checks as needed.  Exercise Prescription Changes:   Exercise Comments:   Exercise Goals and Review: Exercise Goals    Row Name 09/18/17 1627             Exercise Goals   Increase Physical Activity  Yes       Intervention  Provide advice, education, support and counseling about physical activity/exercise needs.;Develop an individualized exercise prescription for aerobic and resistive training based on initial evaluation findings, risk stratification, comorbidities and participant's personal goals.       Expected Outcomes  Long Term: Add in home exercise to make exercise part of routine and to increase amount of physical activity.;Long Term: Exercising regularly at least 3-5 days a week.;Short Term: Attend rehab on a regular basis to increase amount of physical activity.       Increase Strength and Stamina  Yes       Intervention  Provide advice, education, support and counseling about physical activity/exercise needs.;Develop an individualized exercise prescription for aerobic and resistive training based on initial evaluation findings, risk stratification, comorbidities and participant's personal goals.       Expected Outcomes  Short Term: Perform resistance training exercises routinely during rehab and add in resistance training at home;Long Term: Improve cardiorespiratory fitness,  muscular endurance and strength as measured by increased METs and functional capacity (6MWT);Short Term: Increase workloads from initial exercise prescription for resistance, speed, and METs.       Able to understand and use rate of perceived exertion (RPE) scale  Yes       Intervention  Provide education and explanation on how to use RPE scale       Expected Outcomes  Short Term: Able to use RPE daily in rehab to express subjective intensity level;Long Term:  Able to use RPE to guide intensity level when exercising independently       Able to understand and use Dyspnea scale  Yes       Intervention  Provide education and explanation on how to use Dyspnea scale       Expected Outcomes  Short Term: Able to use Dyspnea scale daily in rehab to express subjective sense of shortness of breath during exertion;Long Term: Able to use Dyspnea scale to guide intensity level when exercising independently       Knowledge and understanding of Target Heart Rate Range (THRR)  Yes       Intervention  Provide education and explanation of THRR including how the numbers were predicted and where they are located for  reference       Expected Outcomes  Short Term: Able to state/look up THRR;Short Term: Able to use daily as guideline for intensity in rehab;Long Term: Able to use THRR to govern intensity when exercising independently       Able to check pulse independently  Yes       Intervention  Provide education and demonstration on how to check pulse in carotid and radial arteries.;Review the importance of being able to check your own pulse for safety during independent exercise       Expected Outcomes  Long Term: Able to check pulse independently and accurately;Short Term: Able to explain why pulse checking is important during independent exercise       Understanding of Exercise Prescription  Yes       Intervention  Provide education, explanation, and written materials on patient's individual exercise prescription        Expected Outcomes  Short Term: Able to explain program exercise prescription;Long Term: Able to explain home exercise prescription to exercise independently          Exercise Goals Re-Evaluation :    Discharge Exercise Prescription (Final Exercise Prescription Changes):   Nutrition:  Target Goals: Understanding of nutrition guidelines, daily intake of sodium <1544m, cholesterol <2084m calories 30% from fat and 7% or less from saturated fats, daily to have 5 or more servings of fruits and vegetables.  Biometrics: Pre Biometrics - 09/18/17 1628      Pre Biometrics   Height  5' 9"  (1.753 m)    Weight  187 lb 2.7 oz (84.9 kg)    Waist Circumference  41 inches    Hip Circumference  41.5 inches    Waist to Hip Ratio  0.99 %    BMI (Calculated)  27.63    Triceps Skinfold  9 mm    % Body Fat  26.2 %    Grip Strength  40 kg    Flexibility  0 in    Single Leg Stand  2 seconds        Nutrition Therapy Plan and Nutrition Goals: Nutrition Therapy & Goals - 09/18/17 1626      Nutrition Therapy   Diet  Consistent Carb, Heart Healthy      Personal Nutrition Goals   Nutrition Goal  Pt to identify food quantities necessary to achieve weight gain of ~4 lb at graduation from cardiac rehab. Goal wt of 190 lb desired.       Intervention Plan   Intervention  Prescribe, educate and counsel regarding individualized specific dietary modifications aiming towards targeted core components such as weight, hypertension, lipid management, diabetes, heart failure and other comorbidities.;Nutrition handout(s) given to patient. suggestions for increasing kcal and protein    Expected Outcomes  Short Term Goal: Understand basic principles of dietary content, such as calories, fat, sodium, cholesterol and nutrients.;Long Term Goal: Adherence to prescribed nutrition plan.       Nutrition Assessments: Nutrition Assessments - 09/18/17 1627      MEDFICTS Scores   Pre Score  30       Nutrition Goals  Re-Evaluation:   Nutrition Goals Re-Evaluation:   Nutrition Goals Discharge (Final Nutrition Goals Re-Evaluation):   Psychosocial: Target Goals: Acknowledge presence or absence of significant depression and/or stress, maximize coping skills, provide positive support system. Participant is able to verbalize types and ability to use techniques and skills needed for reducing stress and depression.  Initial Review & Psychosocial Screening: Initial Psych Review & Screening - 09/18/17  1642      Initial Review   Current issues with  None Identified      Family Dynamics   Good Support System?  Yes Dillon Andrews has his wife and children for support      Barriers   Psychosocial barriers to participate in program  There are no identifiable barriers or psychosocial needs.      Screening Interventions   Interventions  Encouraged to exercise       Quality of Life Scores: Quality of Life - 09/18/17 1630      Quality of Life Scores   Health/Function Pre  23.35 %    Socioeconomic Pre  26.8 %    Psych/Spiritual Pre  26.14 %    Family Pre  26.4 %    GLOBAL Pre  25.08 %      Scores of 19 and below usually indicate a poorer quality of life in these areas.  A difference of  2-3 points is a clinically meaningful difference.  A difference of 2-3 points in the total score of the Quality of Life Index has been associated with significant improvement in overall quality of life, self-image, physical symptoms, and general health in studies assessing change in quality of life.  PHQ-9: Recent Review Flowsheet Data    Depression screen [facility] 2/9 04/03/2017 03/05/2017   Decreased Interest 0 0   Down, Depressed, Hopeless 2 0   PHQ - 2 Score 2 0   Altered sleeping 3 -   Tired, decreased energy 2 -   Change in appetite 0 -   Feeling bad or failure about yourself  0 -   Trouble concentrating 0 -   Moving slowly or fidgety/restless 0 -   Suicidal thoughts 0 -   PHQ-9 Score 7 -     Interpretation of Total  Score  Total Score Depression Severity:  1-4 = Minimal depression, 5-9 = Mild depression, 10-14 = Moderate depression, 15-19 = Moderately severe depression, 20-27 = Severe depression   Psychosocial Evaluation and Intervention:   Psychosocial Re-Evaluation:   Psychosocial Discharge (Final Psychosocial Re-Evaluation):   Vocational Rehabilitation: Provide vocational rehab assistance to qualifying candidates.   Vocational Rehab Evaluation & Intervention: Vocational Rehab - 09/18/17 1640      Initial Vocational Rehab Evaluation & Intervention   Assessment shows need for Vocational Rehabilitation  No Mr Lupton is retired and does not need vocational rehab at this time       Education: Education Goals: Education classes will be provided on a weekly basis, covering required topics. Participant will state understanding/return demonstration of topics presented.  Learning Barriers/Preferences: Learning Barriers/Preferences - 09/18/17 1628      Learning Barriers/Preferences   Learning Barriers  Sight    Learning Preferences  Skilled Demonstration;Written Material;Verbal Instruction;Individual Instruction;Pictoral       Education Topics: Count Your Pulse:  -Group instruction provided by verbal instruction, demonstration, patient participation and written materials to support subject.  Instructors address importance of being able to find your pulse and how to count your pulse when at home without a heart monitor.  Patients get hands on experience counting their pulse with staff help and individually.   Heart Attack, Angina, and Risk Factor Modification:  -Group instruction provided by verbal instruction, video, and written materials to support subject.  Instructors address signs and symptoms of angina and heart attacks.    Also discuss risk factors for heart disease and how to make changes to improve heart health risk factors.   Functional Fitness:  -  Group instruction provided by  verbal instruction, demonstration, patient participation, and written materials to support subject.  Instructors address safety measures for doing things around the house.  Discuss how to get up and down off the floor, how to pick things up properly, how to safely get out of a chair without assistance, and balance training.   Meditation and Mindfulness:  -Group instruction provided by verbal instruction, patient participation, and written materials to support subject.  Instructor addresses importance of mindfulness and meditation practice to help reduce stress and improve awareness.  Instructor also leads participants through a meditation exercise.    Stretching for Flexibility and Mobility:  -Group instruction provided by verbal instruction, patient participation, and written materials to support subject.  Instructors lead participants through series of stretches that are designed to increase flexibility thus improving mobility.  These stretches are additional exercise for major muscle groups that are typically performed during regular warm up and cool down.   Hands Only CPR:  -Group verbal, video, and participation provides a basic overview of AHA guidelines for community CPR. Role-play of emergencies allow participants the opportunity to practice calling for help and chest compression technique with discussion of AED use.   Hypertension: -Group verbal and written instruction that provides a basic overview of hypertension including the most recent diagnostic guidelines, risk factor reduction with self-care instructions and medication management.    Nutrition I class: Heart Healthy Eating:  -Group instruction provided by PowerPoint slides, verbal discussion, and written materials to support subject matter. The instructor gives an explanation and review of the Therapeutic Lifestyle Changes diet recommendations, which includes a discussion on lipid goals, dietary fat, sodium, fiber, plant  stanol/sterol esters, sugar, and the components of a well-balanced, healthy diet.   CARDIAC REHAB PHASE II ORIENTATION from 09/18/2017 in [place]  Date  09/18/17  Educator  RD      Nutrition II class: Lifestyle Skills:  -Group instruction provided by PowerPoint slides, verbal discussion, and written materials to support subject matter. The instructor gives an explanation and review of label reading, grocery shopping for heart health, heart healthy recipe modifications, and ways to make healthier choices when eating out.   CARDIAC REHAB PHASE II ORIENTATION from 09/18/2017 in [place]  Date  09/18/17  Educator  RD      Diabetes Question & Answer:  -Group instruction provided by PowerPoint slides, verbal discussion, and written materials to support subject matter. The instructor gives an explanation and review of diabetes co-morbidities, pre- and post-prandial blood glucose goals, pre-exercise blood glucose goals, signs, symptoms, and treatment of hypoglycemia and hyperglycemia, and foot care basics.   Diabetes Blitz:  -Group instruction provided by PowerPoint slides, verbal discussion, and written materials to support subject matter. The instructor gives an explanation and review of the physiology behind type 1 and type 2 diabetes, diabetes medications and rational behind using different medications, pre- and post-prandial blood glucose recommendations and Hemoglobin A1c goals, diabetes diet, and exercise including blood glucose guidelines for exercising safely.    CARDIAC REHAB PHASE II ORIENTATION from 09/18/2017 in [place]  Date  09/18/17  Educator  RD      Portion Distortion:  -Group instruction provided by PowerPoint slides, verbal discussion, written materials, and food models to support subject matter. The instructor gives an explanation of serving size versus portion size, changes in  portions sizes over the last 20 years, and what consists of a serving  from each food group.   Stress Management:  -Group instruction provided by verbal instruction, video, and written materials to support subject matter.  Instructors review role of stress in heart disease and how to cope with stress positively.     Exercising on Your Own:  -Group instruction provided by verbal instruction, power point, and written materials to support subject.  Instructors discuss benefits of exercise, components of exercise, frequency and intensity of exercise, and end points for exercise.  Also discuss use of nitroglycerin and activating EMS.  Review options of places to exercise outside of rehab.  Review guidelines for sex with heart disease.   Cardiac Drugs I:  -Group instruction provided by verbal instruction and written materials to support subject.  Instructor reviews cardiac drug classes: antiplatelets, anticoagulants, beta blockers, and statins.  Instructor discusses reasons, side effects, and lifestyle considerations for each drug class.   Cardiac Drugs II:  -Group instruction provided by verbal instruction and written materials to support subject.  Instructor reviews cardiac drug classes: angiotensin converting enzyme inhibitors (ACE-I), angiotensin II receptor blockers (ARBs), nitrates, and calcium channel blockers.  Instructor discusses reasons, side effects, and lifestyle considerations for each drug class.   Anatomy and Physiology of the Circulatory System:  Group verbal and written instruction and models provide basic cardiac anatomy and physiology, with the coronary electrical and arterial systems. Review of: AMI, Angina, Valve disease, Heart Failure, Peripheral Artery Disease, Cardiac Arrhythmia, Pacemakers, and the ICD.   Other Education:  -Group or individual verbal, written, or video instructions that support the educational goals of the cardiac rehab program.   Holiday Eating Survival  Tips:  -Group instruction provided by PowerPoint slides, verbal discussion, and written materials to support subject matter. The instructor gives patients tips, tricks, and techniques to help them not only survive but enjoy the holidays despite the onslaught of food that accompanies the holidays.   Knowledge Questionnaire Score: Knowledge Questionnaire Score - 09/18/17 1629      Knowledge Questionnaire Score   Pre Score  21/24       Core Components/Risk Factors/Patient Goals at Admission: Personal Goals and Risk Factors at Admission - 09/18/17 1618      Core Components/Risk Factors/Patient Goals on Admission    Weight Management  Yes;Weight Loss    Intervention  Weight Management: Develop a combined nutrition and exercise program designed to reach desired caloric intake, while maintaining appropriate intake of nutrient and fiber, sodium and fats, and appropriate energy expenditure required for the weight goal.;Weight Management: Provide education and appropriate resources to help participant work on and attain dietary goals.;Weight Management/Obesity: Establish reasonable short term and long term weight goals.    Admit Weight  187 lb 2.7 oz (84.9 kg)    Goal Weight: Short Term  182 lb (82.6 kg)    Goal Weight: Long Term  177 lb (80.3 kg)    Expected Outcomes  Short Term: Continue to assess and modify interventions until short term weight is achieved;Long Term: Adherence to nutrition and physical activity/exercise program aimed toward attainment of established weight goal;Weight Loss: Understanding of general recommendations for a balanced deficit meal plan, which promotes 1-2 lb weight loss per week and includes a negative energy balance of [phone] kcal/d;Understanding recommendations for meals to include 15-35% energy as protein, 25-35% energy from fat, 35-60% energy from carbohydrates, less than 26m of dietary cholesterol, 20-35 gm of total fiber daily;Understanding of distribution of  calorie intake throughout the day with the consumption of 4-5 meals/snacks  Diabetes  Yes    Intervention  Provide education about signs/symptoms and action to take for hypo/hyperglycemia.;Provide education about proper nutrition, including hydration, and aerobic/resistive exercise prescription along with prescribed medications to achieve blood glucose in normal ranges: Fasting glucose 65-99 mg/dL    Expected Outcomes  Short Term: Participant verbalizes understanding of the signs/symptoms and immediate care of hyper/hypoglycemia, proper foot care and importance of medication, aerobic/resistive exercise and nutrition plan for blood glucose control.;Long Term: Attainment of HbA1C < 7%.    Heart Failure  Yes    Intervention  Provide a combined exercise and nutrition program that is supplemented with education, support and counseling about heart failure. Directed toward relieving symptoms such as shortness of breath, decreased exercise tolerance, and extremity edema.    Expected Outcomes  Improve functional capacity of life;Short term: Attendance in program 2-3 days a week with increased exercise capacity. Reported lower sodium intake. Reported increased fruit and vegetable intake. Reports medication compliance.;Short term: Daily weights obtained and reported for increase. Utilizing diuretic protocols set by physician.;Long term: Adoption of self-care skills and reduction of barriers for early signs and symptoms recognition and intervention leading to self-care maintenance.    Hypertension  Yes    Intervention  Provide education on lifestyle modifcations including regular physical activity/exercise, weight management, moderate sodium restriction and increased consumption of fresh fruit, vegetables, and low fat dairy, alcohol moderation, and smoking cessation.;Monitor prescription use compliance.    Expected Outcomes  Short Term: Continued assessment and intervention until BP is < 140/13m HG in hypertensive  participants. < 130/820mHG in hypertensive participants with diabetes, heart failure or chronic kidney disease.;Long Term: Maintenance of blood pressure at goal levels.    Lipids  Yes    Intervention  Provide education and support for participant on nutrition & aerobic/resistive exercise along with prescribed medications to achieve LDL <7026mHDL >13m17m  Expected Outcomes  Short Term: Participant states understanding of desired cholesterol values and is compliant with medications prescribed. Participant is following exercise prescription and nutrition guidelines.;Long Term: Cholesterol controlled with medications as prescribed, with individualized exercise RX and with personalized nutrition plan. Value goals: LDL < 70mg73mL > 40 mg.       Core Components/Risk Factors/Patient Goals Review:    Core Components/Risk Factors/Patient Goals at Discharge (Final Review):    ITP Comments: ITP Comments    Row Name 09/18/17 1617           ITP Comments  Dr. TraciFransico Himical Director           Comments: WaynePatrick Jupiternded orientation from 1341 to 1529 to review rules and guidelines for program. Completed 6 minute walk test, Intitial ITP, and exercise prescription.  VSS. Telemetry-Sinus Rhythm first degree heart block bundle branch block. This has been previously documented .  Asymptomatic.Dillon PallBSN 09/18/2017 4:51 PM

## 2017-09-20 ENCOUNTER — Other Ambulatory Visit: Payer: Self-pay | Admitting: Radiology

## 2017-09-22 ENCOUNTER — Other Ambulatory Visit: Payer: Self-pay

## 2017-09-22 ENCOUNTER — Encounter (HOSPITAL_COMMUNITY): Payer: Self-pay | Admitting: *Deleted

## 2017-09-22 ENCOUNTER — Ambulatory Visit (HOSPITAL_COMMUNITY)
Admission: RE | Admit: 2017-09-22 | Discharge: 2017-09-22 | Disposition: A | Discharge status: Home / Self Care | Payer: Medicare HMO | Source: Ambulatory Visit | Attending: Internal Medicine | Admitting: Internal Medicine

## 2017-09-22 ENCOUNTER — Other Ambulatory Visit: Payer: Self-pay | Admitting: Radiology

## 2017-09-22 VITALS — BP 120/78 | HR 70 | Wt 188.8 lb

## 2017-09-22 DIAGNOSIS — K219 Gastro-esophageal reflux disease without esophagitis: Secondary | ICD-10-CM | POA: Diagnosis not present

## 2017-09-22 DIAGNOSIS — Z96652 Presence of left artificial knee joint: Secondary | ICD-10-CM | POA: Diagnosis not present

## 2017-09-22 DIAGNOSIS — Z7982 Long term (current) use of aspirin: Secondary | ICD-10-CM | POA: Insufficient documentation

## 2017-09-22 DIAGNOSIS — I5042 Chronic combined systolic (congestive) and diastolic (congestive) heart failure: Secondary | ICD-10-CM | POA: Diagnosis not present

## 2017-09-22 DIAGNOSIS — E1122 Type 2 diabetes mellitus with diabetic chronic kidney disease: Secondary | ICD-10-CM | POA: Insufficient documentation

## 2017-09-22 DIAGNOSIS — Z87891 Personal history of nicotine dependence: Secondary | ICD-10-CM | POA: Diagnosis not present

## 2017-09-22 DIAGNOSIS — I5022 Chronic systolic (congestive) heart failure: Secondary | ICD-10-CM

## 2017-09-22 DIAGNOSIS — M19012 Primary osteoarthritis, left shoulder: Secondary | ICD-10-CM | POA: Diagnosis not present

## 2017-09-22 DIAGNOSIS — I130 Hypertensive heart and chronic kidney disease with heart failure and stage 1 through stage 4 chronic kidney disease, or unspecified chronic kidney disease: Secondary | ICD-10-CM | POA: Diagnosis not present

## 2017-09-22 DIAGNOSIS — E854 Organ-limited amyloidosis: Secondary | ICD-10-CM | POA: Insufficient documentation

## 2017-09-22 DIAGNOSIS — N183 Chronic kidney disease, stage 3 unspecified: Secondary | ICD-10-CM

## 2017-09-22 DIAGNOSIS — Z85828 Personal history of other malignant neoplasm of skin: Secondary | ICD-10-CM | POA: Insufficient documentation

## 2017-09-22 DIAGNOSIS — Z79899 Other long term (current) drug therapy: Secondary | ICD-10-CM | POA: Insufficient documentation

## 2017-09-22 DIAGNOSIS — E7800 Pure hypercholesterolemia, unspecified: Secondary | ICD-10-CM | POA: Insufficient documentation

## 2017-09-22 DIAGNOSIS — Z7984 Long term (current) use of oral hypoglycemic drugs: Secondary | ICD-10-CM | POA: Insufficient documentation

## 2017-09-22 DIAGNOSIS — I10 Essential (primary) hypertension: Secondary | ICD-10-CM | POA: Diagnosis not present

## 2017-09-22 DIAGNOSIS — I4510 Unspecified right bundle-branch block: Secondary | ICD-10-CM | POA: Diagnosis not present

## 2017-09-22 DIAGNOSIS — I43 Cardiomyopathy in diseases classified elsewhere: Secondary | ICD-10-CM | POA: Diagnosis not present

## 2017-09-22 DIAGNOSIS — I2510 Atherosclerotic heart disease of native coronary artery without angina pectoris: Secondary | ICD-10-CM | POA: Diagnosis not present

## 2017-09-22 DIAGNOSIS — I340 Nonrheumatic mitral (valve) insufficiency: Secondary | ICD-10-CM | POA: Diagnosis not present

## 2017-09-22 DIAGNOSIS — I428 Other cardiomyopathies: Secondary | ICD-10-CM | POA: Diagnosis not present

## 2017-09-22 DIAGNOSIS — K2270 Barrett's esophagus without dysplasia: Secondary | ICD-10-CM | POA: Insufficient documentation

## 2017-09-22 NOTE — Progress Notes (Signed)
Advanced Heart Failure Clinic Note   Referring Physician: PCP: [name], MD PCP-Cardiologist: Glenetta Hew, MD  Primary HF: Dr. [name]   HPI: Dillon Andrews is a 77 y.o. malewith a hx of chronic combined CHF due to NICM, CKD III,presumedNASH(per pt, never proven),esophageal varices per chart(prior glass of wine occasionally but no heavy alcohol),HTN, HLD, DM, Barrett's esophagus, GERD, anemia, RBBB  He was admitted 3/13-3/17/19 for A/C combined HF. While inpatient, SPEP and UPEP sent for concerns for cardiac amyloidosis. He diuresed with IV lasix and was transitioned to lasix 80 mg q am, 40 mg q pm. DC weight 180 lbs.   NICM by cath at [facility] 09/2016 with EF 40-45%. Echo 06/2017: EF 25-30%,NICM by cath at [facility] 09/2016 with EF 40-45%. Echo 06/2017: EF 25-30%,  His SPEP came back with m-spike 0.8% and he was referred to Dr Beryle Beams. UPEP with Bence Jones Protein positive; kappa type.   PYP scan completed on 3/20 strongly suggestive of transthyretin amyloidosis. Genetic testing negative.  Seen in clinic on 3/26 and transitioned from lasix to torsemide. Admitted 3/28-3/31 with URI and A/C HF. URI treated with antibiotics. Diuresed with IV lasix, then transitioned to torsemide 40 mg BID. Genetic testing sent.   He presents today for regular follow up. Up about 8 lbs since seen in April. Feels like he has a better appetite. Feeling OK overall. Continues to have intermittent appetite problems since having his gallbladder removed. He has really good days, and occasional has bad days.  Starts cardiac rehab this week. He remains SOB after walking 150-200 feet. He walked for 6 minutes at [facility] orientation. No longer having to sleep on a wedge. No PND or edema. No CP or palpitations. No syncope or presyncope. Feels stronger. Pending BMBx tomorrow with Dr. Marin Olp.    Past Medical History:  Diagnosis Date  . Anemia 11/2016  . Arthritis    "some; hands/fingers" (06/25/2017)  .  Atrial tachycardia ([place])   . Barrett's esophagus   . Basal cell carcinoma    "face, arms, hands" (06/25/2017)  . Chicken pox   . Cholelithiasis 11/2016   S/P LAP CHOLE  . Chronic combined systolic and diastolic CHF (congestive heart failure) ([place])   . CKD (chronic kidney disease), stage III ([place])   . Esophageal varices (HCC)    Secondary to NAFLD  . Family history of adverse reaction to anesthesia    "daughter's sat drop very low; last OR had to be reintubated" (06/25/2017)  . GERD (gastroesophageal reflux disease)   . High cholesterol   . Hypertension   . Mitral regurgitation   . NAFLD (nonalcoholic fatty liver disease)   . NICM (nonischemic cardiomyopathy) ([place])    Echo 06/2017: Severe LVH.  EF 25 to 30% with mild HK of the baseline after septal wall.  Moderate to severely dilated right atrium, moderately dilated RV. (Concern fr ? amyloidosis).  . NSVT (nonsustained ventricular tachycardia) ([place])   . Pneumonia 09/2016  . RBBB   . Tricuspid regurgitation   . Type II diabetes mellitus ([place])     Current Outpatient Medications  Medication Sig Dispense Refill  . aspirin EC 81 MG tablet Take 81 mg by mouth daily.    . carvedilol (COREG) 6.25 MG tablet TAKE 1 TABLET BY MOUTH TWICE A DAY WITH MEALS 180 tablet 0  . gabapentin (NEURONTIN) 800 MG tablet Take 1 tablet (800 mg total) by mouth at bedtime. 90 tablet 1  . glipiZIDE (GLUCOTROL) 5 MG tablet Take 0.5 tablets (  2.5 mg total) by mouth 2 (two) times daily. 90 tablet 1  . glucose blood test strip Use as instructed 100 each 12  . magnesium oxide (MAG-OX) 400 MG tablet Take 400 mg by mouth daily.    Marland Kitchen omeprazole (PRILOSEC) 40 MG capsule Take 1 capsule (40 mg total) by mouth daily. 90 capsule 1  . potassium chloride SA (K-DUR,KLOR-CON) 20 MEQ tablet TAKE 1 TO 2 TABLET DAILY AS DIRECTED 180 tablet 3  . Probiotic Product (PROBIOTIC DAILY PO) Take 1 tablet by mouth.    . rosuvastatin (CRESTOR) 10 MG tablet TAKE 1 TABLET BY MOUTH ONCE DAILY 90  tablet 0  . torsemide (DEMADEX) 20 MG tablet Take 20 mg by mouth 2 (two) times daily.     . traZODone (DESYREL) 50 MG tablet Take 1 tablet (50 mg total) by mouth at bedtime. 30 tablet 2  . vitamin B-12 (CYANOCOBALAMIN) 500 MCG tablet Take 500 mcg by mouth every other day.    . zolpidem (AMBIEN) 5 MG tablet Take 1 tablet (5 mg total) by mouth at bedtime as needed for sleep. 30 tablet 2   No current facility-administered medications for this encounter.     No Known Allergies    Social History   Socioeconomic History  . Marital status: Married    Spouse name: Not on file  . Number of children: 4  . Years of education: 30  . Highest education level: Not on file  Occupational History  . Occupation: retired  Scientific laboratory technician  . Financial resource strain: Not on file  . Food insecurity:    Worry: Not on file    Inability: Not on file  . Transportation needs:    Medical: Not on file    Non-medical: Not on file  Tobacco Use  . Smoking status: Former Smoker    Packs/day: 0.30    Years: 15.00    Pack years: 4.50    Types: Cigarettes    Last attempt to quit: 03/17/1977    Years since quitting: 40.5  . Smokeless tobacco: Never Used  Substance and Sexual Activity  . Alcohol use: Yes    Comment: 06/25/2017 "nothing since 09/2016;  used to have glass of wine q hs"  . Drug use: No  . Sexual activity: Not on file  Lifestyle  . Physical activity:    Days per week: Not on file    Minutes per session: Not on file  . Stress: Not on file  Relationships  . Social connections:    Talks on phone: Not on file    Gets together: Not on file    Attends religious service: Not on file    Active member of club or organization: Not on file    Attends meetings of clubs or organizations: Not on file    Relationship status: Not on file  . Intimate partner violence:    Fear of current or ex partner: Not on file    Emotionally abused: Not on file    Physically abused: Not on file    Forced sexual  activity: Not on file  Other Topics Concern  . Not on file  Social History Narrative   He is retired from [employer].  Previously worked for [employer] by his daughter --> also a patient of Dr. Ellyn Hack   Lives with wife in a one story home.    Education: high school.       Family History  Problem Relation Age of  Onset  . Heart failure Mother   . Cancer Father   . Cataracts Father   . Breast cancer Sister   . Alcohol abuse Brother   . Liver disease Brother   . Autoimmune disease Daughter        Bechet's syndrome  . Cirrhosis Neg Hx    Vitals:   09/22/17 1349  BP: 120/78  Pulse: 70  SpO2: 95%  Weight: 188 lb 12.8 oz (85.6 kg)   Wt Readings from Last 3 Encounters:  09/22/17 188 lb 12.8 oz (85.6 kg)  09/18/17 187 lb 2.7 oz (84.9 kg)  09/15/17 186 lb (84.4 kg)    PHYSICAL EXAM: General: Well appearing. No resp difficulty. HEENT: Normal. Anicteric  Neck: Supple. JVP 8 cm. Carotids 2+ bilat; no bruits. No thyromegaly or nodule noted. Cor: PMI nondisplaced. RRR, No M/G/R noted Lungs: CTAB, normal effort. No wheeze Abdomen: Soft, non-tender, non-distended, no HSM. No bruits or masses. +BS  Extremities: No cyanosis, clubbing, or rash. R and LLE no edema. Warm  Neuro: Alert & orientedx3, cranial nerves grossly intact. moves all 4 extremities w/o difficulty. Affect pleasant   ASSESSMENT & PLAN:   1. Chronic combined HF. NICM by cath at [facility] 09/2016 with EF 40-45%. Echo 06/2017: EF 25-30%, mild LAE, mild/mod RV dilation, mod/severe RA. 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  - Will work on getting Tafamdis now that it has FDA approved for treatment of Wild-Type Amyloidosis.  - NYHA III - Volume status stable on exam.   - Continue torsemide 20 mg BID.  - Continue coreg 6.25 mg BID. - Continue spiro 12.5 mg daily - No  ARB/entresto with CKD - Sees Dr Posey Pronto.  - He has been seen by Dr. Marin Olp for rule out AL amyloid component. Plan for bone biopsy tomorrow. Body scan Xray negative for lytic lesions. 24 hr urine/protein mentioned, but not yet done.   2. CKD III, baseline ~1.6 -Creatinine 1.64 on 09/15/2017   3. Hx of moderate mitral regurgitation on [facility] TEE 09/2016 - No mention of MR on more recent Echo.   4. HTN - Well controlled on current meds as above.   5. HL - Continue crestor.  - LDL 92 11/2016 (care everywhere)  6. Recurrent bilateral effusions - Clear on exam today.   Shirley Friar, PA-C 09/22/17   Patient seen and examined with the above-signed Advanced Practice Provider and/or Housestaff. I personally reviewed laboratory data, imaging studies and relevant notes. I independently examined the patient and formulated the important aspects of the plan. I have edited the note to reflect any of my changes or salient points. I have personally discussed the plan with the patient and/or family.  Doing very well. NYHA II-III. Volume status ok. Currently being worked up for AL amyloid by Dr. Marin Olp. Bone Scan negative. Pending BMBx tomorrow. Will await the results of the biopsy. Discussed tafamadis therapy for TTR amyloid and will begin paperwork ,   Glori Bickers, MD  2:42 PM

## 2017-09-22 NOTE — Patient Instructions (Signed)
We have started the paperwork for Tafamadis [facility]) the [place] will contact you.  Your physician recommends that you schedule a follow-up appointment in: 3 months

## 2017-09-23 ENCOUNTER — Ambulatory Visit (HOSPITAL_COMMUNITY)
Admission: RE | Admit: 2017-09-23 | Discharge: 2017-09-23 | Disposition: A | Discharge status: Home / Self Care | Payer: Medicare HMO | Source: Ambulatory Visit | Attending: Hematology & Oncology | Admitting: Hematology & Oncology

## 2017-09-23 ENCOUNTER — Other Ambulatory Visit: Payer: Self-pay | Admitting: Family Medicine

## 2017-09-23 ENCOUNTER — Encounter (HOSPITAL_COMMUNITY): Payer: Self-pay

## 2017-09-23 DIAGNOSIS — I130 Hypertensive heart and chronic kidney disease with heart failure and stage 1 through stage 4 chronic kidney disease, or unspecified chronic kidney disease: Secondary | ICD-10-CM

## 2017-09-23 DIAGNOSIS — I252 Old myocardial infarction: Secondary | ICD-10-CM | POA: Insufficient documentation

## 2017-09-23 DIAGNOSIS — E8582 Wild-type transthyretin-related (ATTR) amyloidosis: Secondary | ICD-10-CM

## 2017-09-23 DIAGNOSIS — E1122 Type 2 diabetes mellitus with diabetic chronic kidney disease: Secondary | ICD-10-CM

## 2017-09-23 DIAGNOSIS — Z85828 Personal history of other malignant neoplasm of skin: Secondary | ICD-10-CM | POA: Diagnosis not present

## 2017-09-23 DIAGNOSIS — Z96652 Presence of left artificial knee joint: Secondary | ICD-10-CM | POA: Diagnosis not present

## 2017-09-23 DIAGNOSIS — E854 Organ-limited amyloidosis: Secondary | ICD-10-CM | POA: Insufficient documentation

## 2017-09-23 DIAGNOSIS — D649 Anemia, unspecified: Secondary | ICD-10-CM | POA: Insufficient documentation

## 2017-09-23 DIAGNOSIS — D696 Thrombocytopenia, unspecified: Secondary | ICD-10-CM | POA: Insufficient documentation

## 2017-09-23 DIAGNOSIS — I43 Cardiomyopathy in diseases classified elsewhere: Secondary | ICD-10-CM

## 2017-09-23 DIAGNOSIS — I5042 Chronic combined systolic (congestive) and diastolic (congestive) heart failure: Secondary | ICD-10-CM | POA: Insufficient documentation

## 2017-09-23 DIAGNOSIS — I471 Supraventricular tachycardia: Secondary | ICD-10-CM

## 2017-09-23 DIAGNOSIS — M19012 Primary osteoarthritis, left shoulder: Secondary | ICD-10-CM | POA: Diagnosis not present

## 2017-09-23 DIAGNOSIS — D7589 Other specified diseases of blood and blood-forming organs: Secondary | ICD-10-CM | POA: Diagnosis not present

## 2017-09-23 DIAGNOSIS — Z87891 Personal history of nicotine dependence: Secondary | ICD-10-CM | POA: Diagnosis not present

## 2017-09-23 DIAGNOSIS — G2581 Restless legs syndrome: Secondary | ICD-10-CM

## 2017-09-23 DIAGNOSIS — N183 Chronic kidney disease, stage 3 (moderate): Secondary | ICD-10-CM | POA: Insufficient documentation

## 2017-09-23 DIAGNOSIS — K2270 Barrett's esophagus without dysplasia: Secondary | ICD-10-CM | POA: Diagnosis not present

## 2017-09-23 LAB — CBC WITH DIFFERENTIAL/PLATELET
BASOS ABS: 0 10*3/uL (ref 0.0–0.1)
Basophils Relative: 0 %
EOS ABS: 0.1 10*3/uL (ref 0.0–0.7)
EOS PCT: 2 %
HCT: 39.9 % (ref 39.0–52.0)
Hemoglobin: 12.6 g/dL — ABNORMAL LOW (ref 13.0–17.0)
LYMPHS PCT: 15 %
Lymphs Abs: 1 10*3/uL (ref 0.7–4.0)
MCH: 28.9 pg (ref 26.0–34.0)
MCHC: 31.6 g/dL (ref 30.0–36.0)
MCV: 91.5 fL (ref 78.0–100.0)
Monocytes Absolute: 0.9 10*3/uL (ref 0.1–1.0)
Monocytes Relative: 13 %
NEUTROS PCT: 70 %
Neutro Abs: 4.5 10*3/uL (ref 1.7–7.7)
PLATELETS: 143 10*3/uL — AB (ref 150–400)
RBC: 4.36 MIL/uL (ref 4.22–5.81)
RDW: 20.1 % — ABNORMAL HIGH (ref 11.5–15.5)
WBC: 6.5 10*3/uL (ref 4.0–10.5)

## 2017-09-23 LAB — BASIC METABOLIC PANEL
Anion gap: 11 (ref 5–15)
BUN: 32 mg/dL — AB (ref 6–20)
CO2: 31 mmol/L (ref 22–32)
Calcium: 9.1 mg/dL (ref 8.9–10.3)
Chloride: 102 mmol/L (ref 101–111)
Creatinine, Ser: 1.41 mg/dL — ABNORMAL HIGH (ref 0.61–1.24)
GFR, EST AFRICAN AMERICAN: 54 mL/min — AB (ref >60)
GFR, EST NON AFRICAN AMERICAN: 46 mL/min — AB (ref >60)
Glucose, Bld: 116 mg/dL — ABNORMAL HIGH (ref 65–99)
POTASSIUM: 3.3 mmol/L — AB (ref 3.5–5.1)
SODIUM: 144 mmol/L (ref 135–145)

## 2017-09-23 LAB — PROTIME-INR
INR: 1.21
PROTHROMBIN TIME: 15.2 s (ref 11.4–15.2)

## 2017-09-23 LAB — GLUCOSE, CAPILLARY: GLUCOSE-CAPILLARY: 114 mg/dL — AB (ref 65–99)

## 2017-09-23 MED ORDER — SODIUM CHLORIDE 0.9 % IV SOLN
INTRAVENOUS | Status: DC
  Administered 2017-09-23: 08:00:00 via INTRAVENOUS

## 2017-09-23 MED ORDER — FENTANYL CITRATE (PF) 100 MCG/2ML IJ SOLN
INTRAMUSCULAR | Status: AC | PRN
  Administered 2017-09-23: 50 ug via INTRAVENOUS

## 2017-09-23 MED ORDER — MIDAZOLAM HCL 2 MG/2ML IJ SOLN
INTRAMUSCULAR | Status: AC | PRN
  Administered 2017-09-23: 1 mg via INTRAVENOUS

## 2017-09-23 MED ORDER — FENTANYL CITRATE (PF) 100 MCG/2ML IJ SOLN
INTRAMUSCULAR | Status: AC
  Filled 2017-09-23: qty 2

## 2017-09-23 MED ORDER — MIDAZOLAM HCL 2 MG/2ML IJ SOLN
INTRAMUSCULAR | Status: AC
  Filled 2017-09-23: qty 4

## 2017-09-23 MED ORDER — LIDOCAINE HCL (PF) 1 % IJ SOLN
INTRAMUSCULAR | Status: AC | PRN
  Administered 2017-09-23: 20 mL

## 2017-09-23 NOTE — H&P (Signed)
Chief Complaint: Cardiac amyloidosis  Referring Physician(s): Ennever,Peter R  Supervising Physician: Corrie Mckusick  Patient Status: [facility] - Out-pt  History of Present Illness: Dillon Andrews is a 77 y.o. male was admitted 3/13-3/17/19 for A/C combined HF.   While inpatient, Serum and Urine Protein Electrophoresis were sent for concerns for cardiac amyloidosis.   His SPEP came back with m-spike 0.8% and he was referred to Dr Beryle Beams. UPEP with Bence Jones Protein positive; kappa type.   PYP scan completed on 3/20 strongly suggestive of transthyretin amyloidosis.  Dr. Marin Olp has requested a bone marrow biopsy to rule out AL amyloid component.  He is NPO. He takes aspirin. He feels well today, no fever/chills, no N/V.   Past Medical History:  Diagnosis Date  . Anemia 11/2016  . Arthritis    "some; hands/fingers" (06/25/2017)  . Atrial tachycardia ([place])   . Barrett's esophagus   . Basal cell carcinoma    "face, arms, hands" (06/25/2017)  . Chicken pox   . Cholelithiasis 11/2016   S/P LAP CHOLE  . Chronic combined systolic and diastolic CHF (congestive heart failure) ([place])   . CKD (chronic kidney disease), stage III ([place])   . Esophageal varices (HCC)    Secondary to NAFLD  . Family history of adverse reaction to anesthesia    "daughter's sat drop very low; last OR had to be reintubated" (06/25/2017)  . GERD (gastroesophageal reflux disease)   . High cholesterol   . Hypertension   . Mitral regurgitation   . NAFLD (nonalcoholic fatty liver disease)   . NICM (nonischemic cardiomyopathy) ([place])    Echo 06/2017: Severe LVH.  EF 25 to 30% with mild HK of the baseline after septal wall.  Moderate to severely dilated right atrium, moderately dilated RV. (Concern fr ? amyloidosis).  . NSVT (nonsustained ventricular tachycardia) ([place])   . Pneumonia 09/2016  . RBBB   . Tricuspid regurgitation   . Type II diabetes mellitus ([place])     Past Surgical History:  Procedure  Laterality Date  . BACK SURGERY    . BASAL CELL CARCINOMA EXCISION Right    temple; "had to do skin graft"  . CARDIAC CATHETERIZATION  09/2016   Minimal, non-occlusive CAD, EF 45% [facility])  . CARPAL TUNNEL RELEASE Bilateral   . CATARACT EXTRACTION W/ INTRAOCULAR LENS  IMPLANT, BILATERAL Bilateral   . CHOLECYSTECTOMY N/A 11/22/2016   Procedure: LAPAROSCOPIC CHOLECYSTECTOMY;  Surgeon: Coralie Keens, MD;  Location: [facility];  Service: General;  Laterality: N/A;  . JOINT REPLACEMENT    . LUMBAR DISC SURGERY  X 2  . TONSILLECTOMY AND ADENOIDECTOMY    . TOTAL KNEE ARTHROPLASTY Left 2010  . TRANSESOPHAGEAL ECHOCARDIOGRAM  09/2016   Mild global LV hypokinesis. Moderate MR. Moderate TR. [facility])  . TRANSTHORACIC ECHOCARDIOGRAM  10/2016   [facility]) EF 35-40% with moderate HK. Normal RV function. Moderately severe TR.  Marland Kitchen TRANSTHORACIC ECHOCARDIOGRAM  03/2017; 06/2017    A) ([place]) mild concentric LVH. EF of 40-45%. Diffuse HK with no RWMA. GR 2 DD. Septal dyssynergy. Mild MR, moderate TR. Severe RA dilation..;; B) Severe LVH.  EF 25 to 30% with mild HK of Septal wall.  Mod-severely dilated RA & Mod dilated RV.    Allergies: Patient has no known allergies.  Medications: Prior to Admission medications   Medication Sig Start Date End Date Taking? Authorizing Provider  aspirin EC 81 MG tablet Take 81 mg by mouth daily. 12/23/14  Yes [provider]  carvedilol (COREG) 6.25 MG tablet TAKE 1 TABLET BY MOUTH TWICE A DAY WITH MEALS 07/31/17  Yes Leonie Man, MD  gabapentin (NEURONTIN) 800 MG tablet Take 1 tablet (800 mg total) by mouth at bedtime. 03/05/17  Yes [name], MD  glipiZIDE (GLUCOTROL) 5 MG tablet Take 0.5 tablets (2.5 mg total) by mouth 2 (two) times daily. 07/04/17  Yes [name], MD  magnesium oxide (MAG-OX) 400 MG tablet Take 400 mg by mouth daily.   Yes [provider]  Melatonin 5 MG TABS Take by  mouth at bedtime.   Yes [provider]  omeprazole (PRILOSEC) 40 MG capsule Take 1 capsule (40 mg total) by mouth daily. 07/31/17  Yes [name], MD  potassium chloride SA (K-DUR,KLOR-CON) 20 MEQ tablet TAKE 1 TO 2 TABLET DAILY AS DIRECTED 08/11/17  Yes Leonie Man, MD  Probiotic Product (PROBIOTIC DAILY PO) Take 1 tablet by mouth.   Yes [provider]  rosuvastatin (CRESTOR) 10 MG tablet TAKE 1 TABLET BY MOUTH ONCE DAILY 08/15/17  Yes [name], MD  torsemide (DEMADEX) 20 MG tablet Take 20 mg by mouth 2 (two) times daily.    Yes [provider]  traZODone (DESYREL) 50 MG tablet Take 1 tablet (50 mg total) by mouth at bedtime. 08/12/17  Yes [name], MD  vitamin B-12 (CYANOCOBALAMIN) 500 MCG tablet Take 500 mcg by mouth every other day.   Yes [provider]  zolpidem (AMBIEN) 5 MG tablet Take 1 tablet (5 mg total) by mouth at bedtime as needed for sleep. 07/25/17  Yes [name], MD  glucose blood test strip Use as instructed 03/05/17   [name], MD     Family History  Problem Relation Age of Onset  . Heart failure Mother   . Cancer Father   . Cataracts Father   . Breast cancer Sister   . Alcohol abuse Brother   . Liver disease Brother   . Autoimmune disease Daughter        Bechet's syndrome  . Cirrhosis Neg Hx     Social History   Socioeconomic History  . Marital status: Married    Spouse name: Not on file  . Number of children: 4  . Years of education: 22  . Highest education level: Not on file  Occupational History  . Occupation: retired  Scientific laboratory technician  . Financial resource strain: Not on file  . Food insecurity:    Worry: Not on file    Inability: Not on file  . Transportation needs:    Medical: Not on file    Non-medical: Not on file  Tobacco Use  . Smoking status: Former Smoker    Packs/day: 0.30    Years: 15.00    Pack years: 4.50    Types: Cigarettes    Last  attempt to quit: 03/17/1977    Years since quitting: 40.5  . Smokeless tobacco: Never Used  Substance and Sexual Activity  . Alcohol use: Yes    Comment: 06/25/2017 "nothing since 09/2016;  used to have glass of wine q hs"  . Drug use: No  . Sexual activity: Not on file  Lifestyle  . Physical activity:    Days per week: Not on file    Minutes per session: Not on file  . Stress: Not on file  Relationships  . Social connections:    Talks on phone: Not on file  Gets together: Not on file    Attends religious service: Not on file    Active member of club or organization: Not on file    Attends meetings of clubs or organizations: Not on file    Relationship status: Not on file  Other Topics Concern  . Not on file  Social History Narrative   He is retired from [employer].  Previously worked for [employer] by his daughter --> also a patient of Dr. Ellyn Hack   Lives with wife in a one story home.    Education: high school.      Review of Systems: A 12 point ROS discussed and pertinent positives are indicated in the HPI above.  All other systems are negative. Review of Systems  Vital Signs: BP 118/78 (BP Location: Left Arm)   Pulse 68   Temp 97.9 F (36.6 C) (Oral)   Resp 18   SpO2 98%   Physical Exam  Constitutional: He is oriented to person, place, and time. He appears well-developed.  HENT:  Head: Normocephalic and atraumatic.  Eyes: EOM are normal.  Neck: Normal range of motion.  Cardiovascular: Normal rate, regular rhythm and normal heart sounds.  Pulmonary/Chest: Effort normal and breath sounds normal. No respiratory distress.  Abdominal: Soft. He exhibits no distension. There is no tenderness.  Musculoskeletal: Normal range of motion.  Neurological: He is alert and oriented to person, place, and time.  Skin: Skin is warm and dry.  Psychiatric: He has a normal mood and affect. His behavior is normal. Judgment and thought content normal.  Vitals  reviewed.   Imaging: Dg Bone Survey Met  Result Date: 09/15/2017 CLINICAL DATA:  Amyloidosis EXAM: METASTATIC BONE SURVEY COMPARISON:  None. FINDINGS: Metastatic bone survey was performed. Frontal view of the chest reveals the heart to be at the upper limits of normal in size. Chronic blunting of the costophrenic angles is noted. No focal infiltrate or sizable effusion is seen. Exuberant calcification at the first costal sternal junction is noted. Chronic appearing changes in the clavicles bilaterally are noted. Lateral view of the skull reveals no lytic or sclerotic lesions. Cervical spine reveals multilevel degenerative change. No acute fracture is seen. No lytic or sclerotic lesions are noted. Thoracic spine reveals multilevel degenerative change. The pedicles are within normal limits. No paraspinal mass is seen. No compression deformity is noted. Old rib fractures are seen on the right stable from previous CT of 03/20/2017. Lumbar spine demonstrates multilevel degenerative change. No vertebral body height loss is seen. Aortic calcifications are noted. No lytic or sclerotic lesions are noted. Shoulder girdles demonstrate degenerative change of the glenohumeral and acromioclavicular joints bilaterally. Again no lytic or sclerotic lesions are seen. Bilateral humeri show no lytic or sclerotic lesions. Bilateral forearms demonstrate no lytic or sclerotic lesions. Pelvis demonstrates degenerative changes of the hip joints. Pelvic ring is intact. No lytic or sclerotic lesions are noted. Some dystrophic calcification in the medial aspect of the thighs bilaterally which may be related to myositis ossificans. Femurs are well visualized bilaterally. No lytic or sclerotic lesions are noted. Changes of prior left knee replacement are seen. The lower legs are well visualized bilaterally without acute abnormality. IMPRESSION: Multifocal chronic changes as described above. No lytic or sclerotic lesions are identified.  Electronically Signed   By: Inez Catalina M.D.   On: 09/15/2017 16:29    Labs:  CBC: Recent Labs    07/11/17 0544 08/18/17 1102 09/15/17 1156 09/23/17 0712  WBC 9.1  7.0 6.4 6.5  HGB 12.1* 12.3* 12.2* 12.6*  HCT 39.1 38.6* 38.8 39.9  PLT 158 148.0* 132* 143*    COAGS: Recent Labs    11/22/16 2220 07/10/17 1231 09/23/17 0712  INR 1.28 1.28 1.21    BMP: Recent Labs    07/13/17 0416 07/23/17 1609 08/18/17 1102 09/15/17 1156 09/23/17 0712  NA 140 139 143 145 144  K 3.3* 4.4 4.7 4.9 3.3*  CL 98* 95* 100 104 102  CO2 30 31 36* 31* 31  GLUCOSE 104* 130* 104* 118 116*  BUN 40* 32* 32* 28* 32*  CALCIUM 8.8* 9.2 9.3 9.4 9.1  CREATININE 1.90* 1.71* 1.71* 1.64* 1.41*  GFRNONAA 32* 37*  --  38* 46*  GFRAA 38* 43*  --  45* 54*    LIVER FUNCTION TESTS: Recent Labs    06/25/17 1207 06/26/17 0738 07/10/17 1231 09/15/17 1156  BILITOT 1.3* 1.6* 1.6* 1.2  AST 44* 38 40 38*  ALT 32 _0 ALKPHOS 215* 190* 244* 178*  PROT 7.1 6.7 7.6 7.2  ALBUMIN 3.6 3.5 3.7 3.8    TUMOR MARKERS: No results for input(s): AFPTM, CEA, CA199, CHROMGRNA in the last 8760 hours.  Assessment and Plan:  Chronic combined heart failure, question Cardiac amyloidosis.  Dr. Marin Olp has requested a bone marrow biopsy to rule out AL amyloid component  Will proceed with bone marrow biopsy today by Dr. Earleen Newport.  Risks and benefits discussed with the patient including, but not limited to bleeding, infection, damage to adjacent structures or low yield requiring additional tests.  All of the patient's questions were answered, patient is agreeable to proceed. Consent signed and in chart.  Thank you for this interesting consult.  I greatly enjoyed meeting ODA LANSDOWNE and look forward to participating in their care.  A copy of this report was sent to the requesting provider on this date.  Electronically Signed: Murrell Redden, PA-C   09/23/2017, 8:16 AM      I spent a total of  30  Minutes in face to face in clinical consultation, greater than 50% of which was counseling/coordinating care for bone marrow biopsy.

## 2017-09-23 NOTE — Discharge Instructions (Signed)
Please keep dressing on for 24 hours.    Bone Marrow Aspiration and Bone Marrow Biopsy, Adult, Care After This sheet gives you information about how to care for yourself after your procedure. Your health care provider may also give you more specific instructions. If you have problems or questions, contact your health care provider. What can I expect after the procedure? After the procedure, it is common to have:  Mild pain and tenderness.  Swelling.  Bruising.  Follow these instructions at home:  Take over-the-counter or prescription medicines only as told by your health care provider.  Do not take baths, swim, or use a hot tub until your health care provider approves. Ask if you can take a shower or have a sponge bath.  Follow instructions from your health care provider about how to take care of the puncture site. Make sure you: ? Wash your hands with soap and water before you change your bandage (dressing). If soap and water are not available, use hand sanitizer. ? Change your dressing as told by your health care provider.  Check your puncture siteevery day for signs of infection. Check for: ? More redness, swelling, or pain. ? More fluid or blood. ? Warmth. ? Pus or a bad smell.  Return to your normal activities as told by your health care provider. Ask your health care provider what activities are safe for you.  Do not drive for 24 hours if you were given a medicine to help you relax (sedative).  Keep all follow-up visits as told by your health care provider. This is important. Contact a health care provider if:  You have more redness, swelling, or pain around the puncture site.  You have more fluid or blood coming from the puncture site.  Your puncture site feels warm to the touch.  You have pus or a bad smell coming from the puncture site.  You have a fever.  Your pain is not controlled with medicine. This information is not intended to replace advice given to  you by your health care provider. Make sure you discuss any questions you have with your health care provider. Document Released: 10/19/2004 Document Revised: 10/20/2015 Document Reviewed: 09/13/2015 Elsevier Interactive Patient Education  2018 [employer]. Moderate Conscious Sedation, Adult, Care After These instructions provide you with information about caring for yourself after your procedure. Your health care provider may also give you more specific instructions. Your treatment has been planned according to current medical practices, but problems sometimes occur. Call your health care provider if you have any problems or questions after your procedure. What can I expect after the procedure? After your procedure, it is common:  To feel sleepy for several hours.  To feel clumsy and have poor balance for several hours.  To have poor judgment for several hours.  To vomit if you eat too soon.  Follow these instructions at home: For at least 24 hours after the procedure:   Do not: ? Participate in activities where you could fall or become injured. ? Drive. ? Use heavy machinery. ? Drink alcohol. ? Take sleeping pills or medicines that cause drowsiness. ? Make important decisions or sign legal documents. ? Take care of children on your own.  Rest. Eating and drinking  Follow the diet recommended by your health care provider.  If you vomit: ? Drink water, juice, or soup when you can drink without vomiting. ? Make sure you have little or no nausea before eating solid foods. General instructions  Have a responsible adult stay with you until you are awake and alert.  Take over-the-counter and prescription medicines only as told by your health care provider.  If you smoke, do not smoke without supervision.  Keep all follow-up visits as told by your health care provider. This is important. Contact a health care provider if:  You keep feeling nauseous or you keep  vomiting.  You feel light-headed.  You develop a rash.  You have a fever. Get help right away if:  You have trouble breathing. This information is not intended to replace advice given to you by your health care provider. Make sure you discuss any questions you have with your health care provider. Document Released: 01/20/2013 Document Revised: 09/04/2015 Document Reviewed: 07/22/2015 Elsevier Interactive Patient Education  [employer].

## 2017-09-23 NOTE — Procedures (Signed)
Interventional Radiology Procedure Note  Procedure: CT guided aspirate and core biopsy of right posterior iliac bone Complications: None Recommendations: - Bedrest supine x 1 hrs - OTC's PRN  Pain - Follow biopsy results  Signed,  Dulcy Fanny. Earleen Newport, DO

## 2017-09-24 ENCOUNTER — Telehealth (HOSPITAL_COMMUNITY): Payer: Self-pay | Admitting: *Deleted

## 2017-09-24 ENCOUNTER — Encounter (HOSPITAL_COMMUNITY): Payer: Medicare HMO

## 2017-09-24 NOTE — Telephone Encounter (Signed)
-----  Message from Corrie Mckusick, DO sent at 09/24/2017  9:47 AM EDT ----- Regarding: RE: Ok to proceed with exercise at Cardiac Rehab He should be ok to proceed regarding the recent bx.    JW   ----- Message ----- From: Rowe Pavy, RN Sent: 09/23/2017   6:07 PM To: Corrie Mckusick, DO Subject: Ok to proceed with exercise at Cardiac Rehab   Dr. Earleen Newport,  The above pt was referred by Bensim[facility] to Cardiac rehab s/p combined CHF .  Pt had CT guided bone marrow biopsy performed today - 6/11.  May pt proceed with exercise at cardiac rehab? If so, should he observe any restrictions of activity?  Thank you for your advisement  Maurice Small RN, BSN Cardiac and Pulmonary Rehab Nurse Navigator

## 2017-09-25 ENCOUNTER — Ambulatory Visit (HOSPITAL_COMMUNITY): Payer: Medicare HMO

## 2017-09-26 ENCOUNTER — Encounter (HOSPITAL_COMMUNITY)
Admission: RE | Admit: 2017-09-26 | Discharge: 2017-09-26 | Disposition: A | Discharge status: Home / Self Care | Payer: Medicare HMO | Source: Ambulatory Visit | Attending: Internal Medicine | Admitting: Internal Medicine

## 2017-09-26 ENCOUNTER — Telehealth (HOSPITAL_COMMUNITY): Payer: Self-pay | Admitting: *Deleted

## 2017-09-26 ENCOUNTER — Telehealth: Payer: Self-pay | Admitting: *Deleted

## 2017-09-26 DIAGNOSIS — Z7982 Long term (current) use of aspirin: Secondary | ICD-10-CM | POA: Diagnosis not present

## 2017-09-26 DIAGNOSIS — E119 Type 2 diabetes mellitus without complications: Secondary | ICD-10-CM | POA: Diagnosis not present

## 2017-09-26 DIAGNOSIS — Z79899 Other long term (current) drug therapy: Secondary | ICD-10-CM | POA: Diagnosis not present

## 2017-09-26 DIAGNOSIS — Z79891 Long term (current) use of opiate analgesic: Secondary | ICD-10-CM | POA: Diagnosis not present

## 2017-09-26 DIAGNOSIS — I5042 Chronic combined systolic (congestive) and diastolic (congestive) heart failure: Secondary | ICD-10-CM | POA: Diagnosis not present

## 2017-09-26 DIAGNOSIS — Z7984 Long term (current) use of oral hypoglycemic drugs: Secondary | ICD-10-CM | POA: Diagnosis not present

## 2017-09-26 DIAGNOSIS — N183 Chronic kidney disease, stage 3 (moderate): Secondary | ICD-10-CM | POA: Diagnosis not present

## 2017-09-26 DIAGNOSIS — K760 Fatty (change of) liver, not elsewhere classified: Secondary | ICD-10-CM | POA: Diagnosis not present

## 2017-09-26 DIAGNOSIS — I130 Hypertensive heart and chronic kidney disease with heart failure and stage 1 through stage 4 chronic kidney disease, or unspecified chronic kidney disease: Secondary | ICD-10-CM | POA: Diagnosis not present

## 2017-09-26 DIAGNOSIS — K219 Gastro-esophageal reflux disease without esophagitis: Secondary | ICD-10-CM | POA: Diagnosis not present

## 2017-09-26 DIAGNOSIS — I428 Other cardiomyopathies: Secondary | ICD-10-CM | POA: Diagnosis not present

## 2017-09-26 LAB — GLUCOSE, CAPILLARY: Glucose-Capillary: 136 mg/dL — ABNORMAL HIGH (ref 65–99)

## 2017-09-26 NOTE — Telephone Encounter (Addendum)
Patient's wife is aware of results  ----- Message from Volanda Napoleon, MD sent at 09/26/2017  1:39 PM EDT ----- Call - the bone marrow does NOT show any obvious myeloma or amyloidosis!!  Laurey Arrow

## 2017-09-26 NOTE — Progress Notes (Signed)
Cardiac Individual Treatment Plan  Patient Details  Name: ROHIT DELORIA MRN: [id number] Date of Birth: December 19, 1940 Referring Provider:     CARDIAC REHAB PHASE II ORIENTATION from 09/18/2017 in [facility] [facility]  Referring Provider  Bensimhon, Shaune Pascal MD       Initial Encounter Date:    CARDIAC REHAB PHASE II ORIENTATION from 09/18/2017 in [place]  Date  09/18/17  Referring Provider  Bensimhon, Shaune Pascal MD       Visit Diagnosis: Heart failure, systolic and diastolic, chronic ([place])  Patient's Home Medications on Admission:  Current Outpatient Medications:  .  aspirin EC 81 MG tablet, Take 81 mg by mouth daily., Disp: , Rfl:  .  carvedilol (COREG) 6.25 MG tablet, TAKE 1 TABLET BY MOUTH TWICE A DAY WITH MEALS, Disp: 180 tablet, Rfl: 0 .  gabapentin (NEURONTIN) 800 MG tablet, TAKE 1 TABLET BY MOUTH AT BEDTIME, Disp: 90 tablet, Rfl: 1 .  glipiZIDE (GLUCOTROL) 5 MG tablet, Take 0.5 tablets (2.5 mg total) by mouth 2 (two) times daily., Disp: 90 tablet, Rfl: 1 .  glucose blood test strip, Use as instructed, Disp: 100 each, Rfl: 12 .  Melatonin 5 MG TABS, Take by mouth at bedtime., Disp: , Rfl:  .  omeprazole (PRILOSEC) 40 MG capsule, Take 1 capsule (40 mg total) by mouth daily., Disp: 90 capsule, Rfl: 1 .  potassium chloride SA (K-DUR,KLOR-CON) 20 MEQ tablet, TAKE 1 TO 2 TABLET DAILY AS DIRECTED, Disp: 180 tablet, Rfl: 3 .  rosuvastatin (CRESTOR) 10 MG tablet, TAKE 1 TABLET BY MOUTH ONCE DAILY, Disp: 90 tablet, Rfl: 0 .  torsemide (DEMADEX) 20 MG tablet, Take 20 mg by mouth 2 (two) times daily. , Disp: , Rfl:  .  traZODone (DESYREL) 50 MG tablet, Take 1 tablet (50 mg total) by mouth at bedtime., Disp: 30 tablet, Rfl: 2 .  vitamin B-12 (CYANOCOBALAMIN) 500 MCG tablet, Take 500 mcg by mouth every other day., Disp: , Rfl:  .  zolpidem (AMBIEN) 5 MG tablet, Take 1 tablet (5 mg total) by mouth at bedtime as needed for sleep., Disp: 30  tablet, Rfl: 2 .  magnesium oxide (MAG-OX) 400 MG tablet, Take 400 mg by mouth daily., Disp: , Rfl:  .  Probiotic Product (PROBIOTIC DAILY PO), Take 1 tablet by mouth., Disp: , Rfl:   Past Medical History: Past Medical History:  Diagnosis Date  . Anemia 11/2016  . Arthritis    "some; hands/fingers" (06/25/2017)  . Atrial tachycardia ([place])   . Barrett's esophagus   . Basal cell carcinoma    "face, arms, hands" (06/25/2017)  . Chicken pox   . Cholelithiasis 11/2016   S/P LAP CHOLE  . Chronic combined systolic and diastolic CHF (congestive heart failure) ([place])   . CKD (chronic kidney disease), stage III ([place])   . Esophageal varices (H[facility])    Secondary to NAFLD  . Family history of adverse reaction to anesthesia    "daughter's sat drop very low; last OR had to be reintubated" (06/25/2017)  . GERD (gastroesophageal reflux disease)   . High cholesterol   . Hypertension   . Mitral regurgitation   . NAFLD (nonalcoholic fatty liver disease)   . NICM (nonischemic cardiomyopathy) ([place])    Echo 06/2017: Severe LVH.  EF 25 to 30% with mild HK of the baseline after septal wall.  Moderate to severely dilated right atrium, moderately dilated RV. (Concern fr ? amyloidosis).  . NSVT (nonsustained ventricular  tachycardia) ([place])   . Pneumonia 09/2016  . RBBB   . Tricuspid regurgitation   . Type II diabetes mellitus (H[facility])     Tobacco Use: Social History   Tobacco Use  Smoking Status Former Smoker  . Packs/day: 0.30  . Years: 15.00  . Pack years: 4.50  . Types: Cigarettes  . Last attempt to quit: 03/17/1977  . Years since quitting: 40.5  Smokeless Tobacco Never Used    Labs: Recent Chemical engineer    Labs for ITP Cardiac and Pulmonary Rehab Latest Ref Rng & Units 06/30/2008 11/25/2016 03/27/2017 07/10/2017   Hemoglobin A1c 4.8 - 5.6 % - - 6.9(H) 7.0(H)   PHART 7.350 - 7.450 - 7.465(H) - -   PCO2ART 32.0 - 48.0 mmHg - 45.7 - -   HCO3 20.0 - 28.0 mmol/L - 32.5(H) - -   TCO2 0 - 100  mmol/L 25 - - -   O2SAT % - 95.6 - -      Capillary Blood Glucose: Lab Results  Component Value Date   GLUCAP 114 (H) 09/23/2017   GLUCAP 158 (H) 07/13/2017   GLUCAP 108 (H) 07/13/2017   GLUCAP 127 (H) 07/12/2017   GLUCAP 134 (H) 07/12/2017     Exercise Target Goals:    Exercise Program Goal: Individual exercise prescription set using results from initial 6 min walk test and THRR while considering  patient's activity barriers and safety.   Exercise Prescription Goal: Initial exercise prescription builds to 30-45 minutes a day of aerobic activity, 2-3 days per week.  Home exercise guidelines will be given to patient during program as part of exercise prescription that the participant will acknowledge.  Activity Barriers & Risk Stratification: Activity Barriers & Cardiac Risk Stratification - 09/18/17 1620      Activity Barriers & Cardiac Risk Stratification   Activity Barriers  Muscular Weakness    Cardiac Risk Stratification  High       6 Minute Walk: 6 Minute Walk    Row Name 09/18/17 1619         6 Minute Walk   Phase  Initial     Distance  900 feet     Walk Time  6 minutes     # of Rest Breaks  0     MPH  1.7     METS  1.57     RPE  13     Perceived Dyspnea   0     VO2 Peak  5.51     Symptoms  Yes (comment)     Comments  Leg Fatigue +8     Resting HR  77 bpm     Resting BP  104/62     Resting Oxygen Saturation   96 %     Exercise Oxygen Saturation  during 6 min walk  96 %     Max Ex. HR  86 bpm     Max Ex. BP  118/64     2 Minute Post BP  102/60        Oxygen Initial Assessment:   Oxygen Re-Evaluation:   Oxygen Discharge (Final Oxygen Re-Evaluation):   Initial Exercise Prescription: Initial Exercise Prescription - 09/18/17 1600      Date of Initial Exercise RX and Referring Provider   Date  09/18/17    Referring Provider  Bensimhon, Shaune Pascal MD       Recumbant Bike   Level  1    Watts  10    Minutes  10    METs  1.5      NuStep    Level  1    SPM  75    Minutes  10    METs  1.4      Track   Laps  6    Minutes  10    METs  2      Prescription Details   Frequency (times per week)  3x    Duration  Progress to 30 minutes of continuous aerobic without signs/symptoms of physical distress      Intensity   THRR 40-80% of Max Heartrate  54-114    Ratings of Perceived Exertion  11-13    Perceived Dyspnea  0-4      Progression   Progression  Continue progressive overload as per policy without signs/symptoms or physical distress.      Resistance Training   Training Prescription  Yes    Weight  3lbs    Reps  10-15       Perform Capillary Blood Glucose checks as needed.  Exercise Prescription Changes:   Exercise Comments:   Exercise Goals and Review:  Exercise Goals    Row Name 09/18/17 1627             Exercise Goals   Increase Physical Activity  Yes       Intervention  Provide advice, education, support and counseling about physical activity/exercise needs.;Develop an individualized exercise prescription for aerobic and resistive training based on initial evaluation findings, risk stratification, comorbidities and participant's personal goals.       Expected Outcomes  Long Term: Add in home exercise to make exercise part of routine and to increase amount of physical activity.;Long Term: Exercising regularly at least 3-5 days a week.;Short Term: Attend rehab on a regular basis to increase amount of physical activity.       Increase Strength and Stamina  Yes       Intervention  Provide advice, education, support and counseling about physical activity/exercise needs.;Develop an individualized exercise prescription for aerobic and resistive training based on initial evaluation findings, risk stratification, comorbidities and participant's personal goals.       Expected Outcomes  Short Term: Perform resistance training exercises routinely during rehab and add in resistance training at home;Long Term: Improve  cardiorespiratory fitness, muscular endurance and strength as measured by increased METs and functional capacity (6MWT);Short Term: Increase workloads from initial exercise prescription for resistance, speed, and METs.       Able to understand and use rate of perceived exertion (RPE) scale  Yes       Intervention  Provide education and explanation on how to use RPE scale       Expected Outcomes  Short Term: Able to use RPE daily in rehab to express subjective intensity level;Long Term:  Able to use RPE to guide intensity level when exercising independently       Able to understand and use Dyspnea scale  Yes       Intervention  Provide education and explanation on how to use Dyspnea scale       Expected Outcomes  Short Term: Able to use Dyspnea scale daily in rehab to express subjective sense of shortness of breath during exertion;Long Term: Able to use Dyspnea scale to guide intensity level when exercising independently       Knowledge and understanding of Target Heart Rate Range (THRR)  Yes       Intervention  Provide education and explanation of  THRR including how the numbers were predicted and where they are located for reference       Expected Outcomes  Short Term: Able to state/look up THRR;Short Term: Able to use daily as guideline for intensity in rehab;Long Term: Able to use THRR to govern intensity when exercising independently       Able to check pulse independently  Yes       Intervention  Provide education and demonstration on how to check pulse in carotid and radial arteries.;Review the importance of being able to check your own pulse for safety during independent exercise       Expected Outcomes  Long Term: Able to check pulse independently and accurately;Short Term: Able to explain why pulse checking is important during independent exercise       Understanding of Exercise Prescription  Yes       Intervention  Provide education, explanation, and written materials on patient's individual  exercise prescription       Expected Outcomes  Short Term: Able to explain program exercise prescription;Long Term: Able to explain home exercise prescription to exercise independently          Exercise Goals Re-Evaluation :    Discharge Exercise Prescription (Final Exercise Prescription Changes):   Nutrition:  Target Goals: Understanding of nutrition guidelines, daily intake of sodium <1512m, cholesterol <2067m calories 30% from fat and 7% or less from saturated fats, daily to have 5 or more servings of fruits and vegetables.  Biometrics: Pre Biometrics - 09/18/17 1628      Pre Biometrics   Height  5' 9"  (1.753 m)    Weight  187 lb 2.7 oz (84.9 kg)    Waist Circumference  41 inches    Hip Circumference  41.5 inches    Waist to Hip Ratio  0.99 %    BMI (Calculated)  27.63    Triceps Skinfold  9 mm    % Body Fat  26.2 %    Grip Strength  40 kg    Flexibility  0 in    Single Leg Stand  2 seconds        Nutrition Therapy Plan and Nutrition Goals: Nutrition Therapy & Goals - 09/18/17 1626      Nutrition Therapy   Diet  Consistent Carb, Heart Healthy      Personal Nutrition Goals   Nutrition Goal  Pt to identify food quantities necessary to achieve weight gain of ~4 lb at graduation from cardiac rehab. Goal wt of 190 lb desired.       Intervention Plan   Intervention  Prescribe, educate and counsel regarding individualized specific dietary modifications aiming towards targeted core components such as weight, hypertension, lipid management, diabetes, heart failure and other comorbidities.;Nutrition handout(s) given to patient. suggestions for increasing kcal and protein    Expected Outcomes  Short Term Goal: Understand basic principles of dietary content, such as calories, fat, sodium, cholesterol and nutrients.;Long Term Goal: Adherence to prescribed nutrition plan.       Nutrition Assessments: Nutrition Assessments - 09/18/17 1627      MEDFICTS Scores   Pre Score   30       Nutrition Goals Re-Evaluation:   Nutrition Goals Re-Evaluation:   Nutrition Goals Discharge (Final Nutrition Goals Re-Evaluation):   Psychosocial: Target Goals: Acknowledge presence or absence of significant depression and/or stress, maximize coping skills, provide positive support system. Participant is able to verbalize types and ability to use techniques and skills needed for reducing stress and depression.  Initial Review & Psychosocial Screening: Initial Psych Review & Screening - 09/18/17 1642      Initial Review   Current issues with  None Identified      Family Dynamics   Good Support System?  Yes Patrick Jupiter has his wife and children for support      Barriers   Psychosocial barriers to participate in program  There are no identifiable barriers or psychosocial needs.      Screening Interventions   Interventions  Encouraged to exercise       Quality of Life Scores: Quality of Life - 09/18/17 1630      Quality of Life Scores   Health/Function Pre  23.35 %    Socioeconomic Pre  26.8 %    Psych/Spiritual Pre  26.14 %    Family Pre  26.4 %    GLOBAL Pre  25.08 %      Scores of 19 and below usually indicate a poorer quality of life in these areas.  A difference of  2-3 points is a clinically meaningful difference.  A difference of 2-3 points in the total score of the Quality of Life Index has been associated with significant improvement in overall quality of life, self-image, physical symptoms, and general health in studies assessing change in quality of life.  PHQ-9: Recent Review Flowsheet Data    Depression screen [facility] 2/9 09/26/2017 04/03/2017 03/05/2017   Decreased Interest 0 0 0   Down, Depressed, Hopeless 0 2 0   PHQ - 2 Score 0 2 0   Altered sleeping - 3 -   Tired, decreased energy - 2 -   Change in appetite - 0 -   Feeling bad or failure about yourself  - 0 -   Trouble concentrating - 0 -   Moving slowly or fidgety/restless - 0 -   Suicidal  thoughts - 0 -   PHQ-9 Score - 7 -     Interpretation of Total Score  Total Score Depression Severity:  1-4 = Minimal depression, 5-9 = Mild depression, 10-14 = Moderate depression, 15-19 = Moderately severe depression, 20-27 = Severe depression   Psychosocial Evaluation and Intervention:   Psychosocial Re-Evaluation:   Psychosocial Discharge (Final Psychosocial Re-Evaluation):   Vocational Rehabilitation: Provide vocational rehab assistance to qualifying candidates.   Vocational Rehab Evaluation & Intervention: Vocational Rehab - 09/18/17 1640      Initial Vocational Rehab Evaluation & Intervention   Assessment shows need for Vocational Rehabilitation  No Mr Haq is retired and does not need vocational rehab at this time       Education: Education Goals: Education classes will be provided on a weekly basis, covering required topics. Participant will state understanding/return demonstration of topics presented.  Learning Barriers/Preferences: Learning Barriers/Preferences - 09/18/17 1628      Learning Barriers/Preferences   Learning Barriers  Sight    Learning Preferences  Skilled Demonstration;Written Material;Verbal Instruction;Individual Instruction;Pictoral       Education Topics: Count Your Pulse:  -Group instruction provided by verbal instruction, demonstration, patient participation and written materials to support subject.  Instructors address importance of being able to find your pulse and how to count your pulse when at home without a heart monitor.  Patients get hands on experience counting their pulse with staff help and individually.   Heart Attack, Angina, and Risk Factor Modification:  -Group instruction provided by verbal instruction, video, and written materials to support subject.  Instructors address signs and symptoms of angina and heart attacks.  Also discuss risk factors for heart disease and how to make changes to improve heart health risk  factors.   Functional Fitness:  -Group instruction provided by verbal instruction, demonstration, patient participation, and written materials to support subject.  Instructors address safety measures for doing things around the house.  Discuss how to get up and down off the floor, how to pick things up properly, how to safely get out of a chair without assistance, and balance training.   Meditation and Mindfulness:  -Group instruction provided by verbal instruction, patient participation, and written materials to support subject.  Instructor addresses importance of mindfulness and meditation practice to help reduce stress and improve awareness.  Instructor also leads participants through a meditation exercise.    Stretching for Flexibility and Mobility:  -Group instruction provided by verbal instruction, patient participation, and written materials to support subject.  Instructors lead participants through series of stretches that are designed to increase flexibility thus improving mobility.  These stretches are additional exercise for major muscle groups that are typically performed during regular warm up and cool down.   Hands Only CPR:  -Group verbal, video, and participation provides a basic overview of AHA guidelines for community CPR. Role-play of emergencies allow participants the opportunity to practice calling for help and chest compression technique with discussion of AED use.   Hypertension: -Group verbal and written instruction that provides a basic overview of hypertension including the most recent diagnostic guidelines, risk factor reduction with self-care instructions and medication management.    Nutrition I class: Heart Healthy Eating:  -Group instruction provided by PowerPoint slides, verbal discussion, and written materials to support subject matter. The instructor gives an explanation and review of the Therapeutic Lifestyle Changes diet recommendations, which includes a  discussion on lipid goals, dietary fat, sodium, fiber, plant stanol/sterol esters, sugar, and the components of a well-balanced, healthy diet.   CARDIAC REHAB PHASE II ORIENTATION from 09/18/2017 in [place]  Date  09/18/17  Educator  RD      Nutrition II class: Lifestyle Skills:  -Group instruction provided by PowerPoint slides, verbal discussion, and written materials to support subject matter. The instructor gives an explanation and review of label reading, grocery shopping for heart health, heart healthy recipe modifications, and ways to make healthier choices when eating out.   CARDIAC REHAB PHASE II ORIENTATION from 09/18/2017 in [place]  Date  09/18/17  Educator  RD      Diabetes Question & Answer:  -Group instruction provided by PowerPoint slides, verbal discussion, and written materials to support subject matter. The instructor gives an explanation and review of diabetes co-morbidities, pre- and post-prandial blood glucose goals, pre-exercise blood glucose goals, signs, symptoms, and treatment of hypoglycemia and hyperglycemia, and foot care basics.   Diabetes Blitz:  -Group instruction provided by PowerPoint slides, verbal discussion, and written materials to support subject matter. The instructor gives an explanation and review of the physiology behind type 1 and type 2 diabetes, diabetes medications and rational behind using different medications, pre- and post-prandial blood glucose recommendations and Hemoglobin A1c goals, diabetes diet, and exercise including blood glucose guidelines for exercising safely.    CARDIAC REHAB PHASE II ORIENTATION from 09/18/2017 in [place]  Date  09/18/17  Educator  RD      Portion Distortion:  -Group instruction provided by PowerPoint slides, verbal discussion, written materials, and food models to support subject matter. The instructor gives an  explanation of serving size versus portion size, changes in portions sizes over the last 20 years, and what consists of a serving from each food group.   Stress Management:  -Group instruction provided by verbal instruction, video, and written materials to support subject matter.  Instructors review role of stress in heart disease and how to cope with stress positively.     Exercising on Your Own:  -Group instruction provided by verbal instruction, power point, and written materials to support subject.  Instructors discuss benefits of exercise, components of exercise, frequency and intensity of exercise, and end points for exercise.  Also discuss use of nitroglycerin and activating EMS.  Review options of places to exercise outside of rehab.  Review guidelines for sex with heart disease.   Cardiac Drugs I:  -Group instruction provided by verbal instruction and written materials to support subject.  Instructor reviews cardiac drug classes: antiplatelets, anticoagulants, beta blockers, and statins.  Instructor discusses reasons, side effects, and lifestyle considerations for each drug class.   Cardiac Drugs II:  -Group instruction provided by verbal instruction and written materials to support subject.  Instructor reviews cardiac drug classes: angiotensin converting enzyme inhibitors (ACE-I), angiotensin II receptor blockers (ARBs), nitrates, and calcium channel blockers.  Instructor discusses reasons, side effects, and lifestyle considerations for each drug class.   Anatomy and Physiology of the Circulatory System:  Group verbal and written instruction and models provide basic cardiac anatomy and physiology, with the coronary electrical and arterial systems. Review of: AMI, Angina, Valve disease, Heart Failure, Peripheral Artery Disease, Cardiac Arrhythmia, Pacemakers, and the ICD.   Other Education:  -Group or individual verbal, written, or video instructions that support the educational  goals of the cardiac rehab program.   Holiday Eating Survival Tips:  -Group instruction provided by PowerPoint slides, verbal discussion, and written materials to support subject matter. The instructor gives patients tips, tricks, and techniques to help them not only survive but enjoy the holidays despite the onslaught of food that accompanies the holidays.   Knowledge Questionnaire Score: Knowledge Questionnaire Score - 09/18/17 1629      Knowledge Questionnaire Score   Pre Score  21/24       Core Components/Risk Factors/Patient Goals at Admission: Personal Goals and Risk Factors at Admission - 09/18/17 1618      Core Components/Risk Factors/Patient Goals on Admission    Weight Management  Yes;Weight Loss    Intervention  Weight Management: Develop a combined nutrition and exercise program designed to reach desired caloric intake, while maintaining appropriate intake of nutrient and fiber, sodium and fats, and appropriate energy expenditure required for the weight goal.;Weight Management: Provide education and appropriate resources to help participant work on and attain dietary goals.;Weight Management/Obesity: Establish reasonable short term and long term weight goals.    Admit Weight  187 lb 2.7 oz (84.9 kg)    Goal Weight: Short Term  182 lb (82.6 kg)    Goal Weight: Long Term  177 lb (80.3 kg)    Expected Outcomes  Short Term: Continue to assess and modify interventions until short term weight is achieved;Long Term: Adherence to nutrition and physical activity/exercise program aimed toward attainment of established weight goal;Weight Loss: Understanding of general recommendations for a balanced deficit meal plan, which promotes 1-2 lb weight loss per week and includes a negative energy balance of [phone] kcal/d;Understanding recommendations for meals to include 15-35% energy as protein, 25-35% energy from fat, 35-60% energy from carbohydrates, less than 254m of dietary cholesterol,  20-35  gm of total fiber daily;Understanding of distribution of calorie intake throughout the day with the consumption of 4-5 meals/snacks    Diabetes  Yes    Intervention  Provide education about signs/symptoms and action to take for hypo/hyperglycemia.;Provide education about proper nutrition, including hydration, and aerobic/resistive exercise prescription along with prescribed medications to achieve blood glucose in normal ranges: Fasting glucose 65-99 mg/dL    Expected Outcomes  Short Term: Participant verbalizes understanding of the signs/symptoms and immediate care of hyper/hypoglycemia, proper foot care and importance of medication, aerobic/resistive exercise and nutrition plan for blood glucose control.;Long Term: Attainment of HbA1C < 7%.    Heart Failure  Yes    Intervention  Provide a combined exercise and nutrition program that is supplemented with education, support and counseling about heart failure. Directed toward relieving symptoms such as shortness of breath, decreased exercise tolerance, and extremity edema.    Expected Outcomes  Improve functional capacity of life;Short term: Attendance in program 2-3 days a week with increased exercise capacity. Reported lower sodium intake. Reported increased fruit and vegetable intake. Reports medication compliance.;Short term: Daily weights obtained and reported for increase. Utilizing diuretic protocols set by physician.;Long term: Adoption of self-care skills and reduction of barriers for early signs and symptoms recognition and intervention leading to self-care maintenance.    Hypertension  Yes    Intervention  Provide education on lifestyle modifcations including regular physical activity/exercise, weight management, moderate sodium restriction and increased consumption of fresh fruit, vegetables, and low fat dairy, alcohol moderation, and smoking cessation.;Monitor prescription use compliance.    Expected Outcomes  Short Term: Continued  assessment and intervention until BP is < 140/30m HG in hypertensive participants. < 130/879mHG in hypertensive participants with diabetes, heart failure or chronic kidney disease.;Long Term: Maintenance of blood pressure at goal levels.    Lipids  Yes    Intervention  Provide education and support for participant on nutrition & aerobic/resistive exercise along with prescribed medications to achieve LDL <7061mHDL >58m36m  Expected Outcomes  Short Term: Participant states understanding of desired cholesterol values and is compliant with medications prescribed. Participant is following exercise prescription and nutrition guidelines.;Long Term: Cholesterol controlled with medications as prescribed, with individualized exercise RX and with personalized nutrition plan. Value goals: LDL < 70mg26mL > 40 mg.       Core Components/Risk Factors/Patient Goals Review:    Core Components/Risk Factors/Patient Goals at Discharge (Final Review):    ITP Comments: ITP Comments    Row Name 09/18/17 1617 09/26/17 1520         ITP Comments  Dr. TraciFransico Himical Director   30 day ITP comment.          Comments: Pt started cardiac rehab today.  Pt tolerated light exercise without difficulty. VSS, telemetry-Sinus rhythm bundle branch block, first degree heart block, asymptomatic.  Medication list reconciled. Pt denies barriers to medicaiton compliance.  PSYCHOSOCIAL ASSESSMENT:  PHQ-0. Pt exhibits positive coping skills, hopeful outlook with supportive family. No psychosocial needs identified at this time, no psychosocial interventions necessary.    Pt enjoys playing golf and working on lawn [employer] oriented to exercise equipment and routine.    Understanding verbalized.MariaBarnet PallBSN 09/26/2017 3:23 PM

## 2017-09-29 ENCOUNTER — Encounter: Payer: Self-pay | Admitting: Family Medicine

## 2017-09-29 ENCOUNTER — Ambulatory Visit (INDEPENDENT_AMBULATORY_CARE_PROVIDER_SITE_OTHER): Payer: Medicare HMO | Admitting: Family Medicine

## 2017-09-29 ENCOUNTER — Encounter (HOSPITAL_COMMUNITY): Payer: Medicare HMO

## 2017-09-29 ENCOUNTER — Encounter (HOSPITAL_COMMUNITY)
Admission: RE | Admit: 2017-09-29 | Discharge: 2017-09-29 | Disposition: A | Discharge status: Home / Self Care | Payer: Medicare HMO | Source: Ambulatory Visit | Attending: Internal Medicine | Admitting: Internal Medicine

## 2017-09-29 VITALS — BP 128/76 | HR 79 | Temp 97.9°F | Ht 69.0 in | Wt 188.2 lb

## 2017-09-29 DIAGNOSIS — E1122 Type 2 diabetes mellitus with diabetic chronic kidney disease: Secondary | ICD-10-CM | POA: Diagnosis not present

## 2017-09-29 DIAGNOSIS — I5042 Chronic combined systolic (congestive) and diastolic (congestive) heart failure: Secondary | ICD-10-CM

## 2017-09-29 DIAGNOSIS — Z7982 Long term (current) use of aspirin: Secondary | ICD-10-CM | POA: Diagnosis not present

## 2017-09-29 DIAGNOSIS — E119 Type 2 diabetes mellitus without complications: Secondary | ICD-10-CM | POA: Diagnosis not present

## 2017-09-29 DIAGNOSIS — N183 Chronic kidney disease, stage 3 unspecified: Secondary | ICD-10-CM

## 2017-09-29 DIAGNOSIS — K760 Fatty (change of) liver, not elsewhere classified: Secondary | ICD-10-CM | POA: Diagnosis not present

## 2017-09-29 DIAGNOSIS — I428 Other cardiomyopathies: Secondary | ICD-10-CM | POA: Diagnosis not present

## 2017-09-29 DIAGNOSIS — G4701 Insomnia due to medical condition: Secondary | ICD-10-CM | POA: Diagnosis not present

## 2017-09-29 DIAGNOSIS — K219 Gastro-esophageal reflux disease without esophagitis: Secondary | ICD-10-CM | POA: Diagnosis not present

## 2017-09-29 DIAGNOSIS — Z7984 Long term (current) use of oral hypoglycemic drugs: Secondary | ICD-10-CM | POA: Diagnosis not present

## 2017-09-29 DIAGNOSIS — I130 Hypertensive heart and chronic kidney disease with heart failure and stage 1 through stage 4 chronic kidney disease, or unspecified chronic kidney disease: Secondary | ICD-10-CM | POA: Diagnosis not present

## 2017-09-29 LAB — GLUCOSE, CAPILLARY: Glucose-Capillary: 116 mg/dL — ABNORMAL HIGH (ref 65–99)

## 2017-09-29 LAB — HEMOGLOBIN A1C: Hgb A1c MFr Bld: 6.4 % (ref 4.6–6.5)

## 2017-09-29 NOTE — Progress Notes (Signed)
Subjective:  Patient ID: Dillon Andrews, male    DOB: [birth year]  Age: 77 y.o. MRN: [id number]  CC: Follow-up   HPI Dillon Andrews presents for follow-up of his insomnia, CKD and diabetes.  He is currently in cardiac rehab and his blood sugars have been dipping below 80.  He is hoping to rebuild the strength in his legs so that he can play golf again.  He is taking glipizide only.  Last hemoglobin A1c was 7.  GFR measured 6 days ago was 46.  He is getting up to 7 hours of sleep taking Ambien trazodone and melatonin.  He is supplementing with a protein shake.  Outpatient Medications Prior to Visit  Medication Sig Dispense Refill  . aspirin EC 81 MG tablet Take 81 mg by mouth daily.    . carvedilol (COREG) 6.25 MG tablet TAKE 1 TABLET BY MOUTH TWICE A DAY WITH MEALS 180 tablet 0  . gabapentin (NEURONTIN) 800 MG tablet TAKE 1 TABLET BY MOUTH AT BEDTIME 90 tablet 1  . glipiZIDE (GLUCOTROL) 5 MG tablet Take 0.5 tablets (2.5 mg total) by mouth 2 (two) times daily. 90 tablet 1  . glucose blood test strip Use as instructed 100 each 12  . magnesium oxide (MAG-OX) 400 MG tablet Take 400 mg by mouth daily.    . Melatonin 5 MG TABS Take by mouth at bedtime.    Marland Kitchen omeprazole (PRILOSEC) 40 MG capsule Take 1 capsule (40 mg total) by mouth daily. 90 capsule 1  . potassium chloride SA (K-DUR,KLOR-CON) 20 MEQ tablet TAKE 1 TO 2 TABLET DAILY AS DIRECTED 180 tablet 3  . Probiotic Product (PROBIOTIC DAILY PO) Take 1 tablet by mouth.    . rosuvastatin (CRESTOR) 10 MG tablet TAKE 1 TABLET BY MOUTH ONCE DAILY 90 tablet 0  . torsemide (DEMADEX) 20 MG tablet Take 20 mg by mouth 2 (two) times daily.     . traZODone (DESYREL) 50 MG tablet Take 1 tablet (50 mg total) by mouth at bedtime. 30 tablet 2  . vitamin B-12 (CYANOCOBALAMIN) 500 MCG tablet Take 500 mcg by mouth every other day.    . zolpidem (AMBIEN) 5 MG tablet Take 1 tablet (5 mg total) by mouth at bedtime as needed for sleep. 30 tablet 2   No  facility-administered medications prior to visit.     ROS Review of Systems  Constitutional: Negative for activity change, appetite change, fatigue and fever.  HENT: Negative.   Eyes: Negative.   Respiratory: Negative.   Cardiovascular: Negative.   Gastrointestinal: Negative.   Endocrine: Negative for polyphagia and polyuria.  Genitourinary: Negative.   Musculoskeletal: Positive for gait problem and myalgias.  Skin: Negative for pallor and rash.  Neurological: Negative for weakness and headaches.  Hematological: Does not bruise/bleed easily.  Psychiatric/Behavioral: Negative for dysphoric mood and sleep disturbance. The patient is not nervous/anxious.     Objective:  BP 128/76   Pulse 79   Temp 97.9 F (36.6 C)   Ht 5' 9"  (1.753 m)   Wt 188 lb 4 oz (85.4 kg)   SpO2 97%   BMI 27.80 kg/m   BP Readings from Last 3 Encounters:  09/29/17 128/76  09/23/17 126/81  09/22/17 120/78    Wt Readings from Last 3 Encounters:  09/29/17 188 lb 4 oz (85.4 kg)  09/22/17 188 lb 12.8 oz (85.6 kg)  09/18/17 187 lb 2.7 oz (84.9 kg)    Physical Exam  Constitutional: He is oriented to person, place,  and time. He appears well-developed and well-nourished. No distress.  HENT:  Head: Normocephalic and atraumatic.  Right Ear: External ear normal.  Left Ear: External ear normal.  Eyes: Right eye exhibits no discharge. Left eye exhibits no discharge. No scleral icterus.  Neck: Normal range of motion. No JVD present. No tracheal deviation present. No thyromegaly present.  Cardiovascular: Normal rate, regular rhythm and normal heart sounds.  Pulmonary/Chest: Effort normal and breath sounds normal.  Abdominal: Bowel sounds are normal.  Musculoskeletal:       Legs: Neurological: He is alert and oriented to person, place, and time.  Skin: Skin is warm and dry. He is not diaphoretic.  Psychiatric: He has a normal mood and affect. His behavior is normal. Thought content normal.    Lab Results   Component Value Date   WBC 6.5 09/23/2017   HGB 12.6 (L) 09/23/2017   HCT 39.9 09/23/2017   PLT 143 (L) 09/23/2017   GLUCOSE 116 (H) 09/23/2017   ALT 30 09/15/2017   AST 38 (H) 09/15/2017   NA 144 09/23/2017   K 3.3 (L) 09/23/2017   CL 102 09/23/2017   CREATININE 1.41 (H) 09/23/2017   BUN 32 (H) 09/23/2017   CO2 31 09/23/2017   TSH 2.456 06/26/2017   INR 1.21 09/23/2017   HGBA1C 7.0 (H) 07/10/2017    No results found.  Assessment & Plan:   Dillon Andrews was seen today for follow-up.  Diagnoses and all orders for this visit:  Type 2 diabetes mellitus with stage 3 chronic kidney disease, without long-term current use of insulin (HCC) -     Hemoglobin A1c  Insomnia due to medical condition  CKD (chronic kidney disease) stage 3, GFR 30-59 ml/min (HCC) -     Basic metabolic panel   I am having Dillon Andrews maintain his aspirin EC, vitamin B-12, glucose blood, glipiZIDE, torsemide, zolpidem, carvedilol, omeprazole, potassium chloride SA, traZODone, rosuvastatin, Probiotic Product (PROBIOTIC DAILY PO), magnesium oxide, Melatonin, and gabapentin.  No orders of the defined types were placed in this encounter.  With the patient's weight loss and his lack of affinity for sweets I think that we can do without the glipizide.  Hemoglobin A1c for today is pending.  Insomnia is controlled with Ambien, trazodone and melatonin.  Encouraged him to continue cardiac rehab to also hopefully build the strength in his legs.  Asked him to watch the sugar content in his protein supplementation.  Follow-up: No follow-ups on file.  [name], MD

## 2017-09-30 ENCOUNTER — Other Ambulatory Visit: Payer: Self-pay

## 2017-09-30 ENCOUNTER — Encounter (HOSPITAL_COMMUNITY): Payer: Self-pay | Admitting: Hematology & Oncology

## 2017-10-01 ENCOUNTER — Encounter (HOSPITAL_COMMUNITY): Payer: Medicare HMO

## 2017-10-01 ENCOUNTER — Telehealth (HOSPITAL_COMMUNITY): Payer: Self-pay | Admitting: Cardiology

## 2017-10-01 ENCOUNTER — Telehealth (HOSPITAL_COMMUNITY): Payer: Self-pay | Admitting: Family Medicine

## 2017-10-01 NOTE — Telephone Encounter (Signed)
DAUGHTER CALLED WITH CONCERNS REGARDING INCREASED SOB, INCREASED FATIGUE, AND REPORTS PATIENT IS PALE IN COLOR.  PATIENT STATES HE DOES NOT FEEL WELL AND FELLING HAS INCREASED OVER THE PAST FEW DAYS.  DENIES MISSING ANY DOSES OF MEDICATIONS.  WEIGHT STABLE @ 184.6LB DENIES EDEMA OR CHEST PAINS   PLEASE ADVISE

## 2017-10-01 NOTE — Telephone Encounter (Signed)
[place]

## 2017-10-01 NOTE — Telephone Encounter (Signed)
   Please call and advise him up with PCP or go to [facility] ED.    Archimedes Harold NP-C  2:40 PM

## 2017-10-02 ENCOUNTER — Telehealth (HOSPITAL_COMMUNITY): Payer: Self-pay | Admitting: Family Medicine

## 2017-10-02 NOTE — Telephone Encounter (Signed)
[facility] for Dillon Andrews [phone]   Spoke directly to patient and he reports he is feeling much better, he feels symptoms are more related to his blood sugar being too low, reports he was seen by PCP on 6/17 and meds were stopped as his levels were too low.  Advised to keep checking BS levels and be sure to follow up with PCP. Also advised to call if symptoms return

## 2017-10-03 ENCOUNTER — Encounter (HOSPITAL_COMMUNITY): Payer: Medicare HMO

## 2017-10-06 ENCOUNTER — Other Ambulatory Visit: Payer: Self-pay | Admitting: Family Medicine

## 2017-10-06 ENCOUNTER — Telehealth: Payer: Self-pay | Admitting: Family Medicine

## 2017-10-06 ENCOUNTER — Telehealth: Payer: Self-pay | Admitting: *Deleted

## 2017-10-06 ENCOUNTER — Encounter (HOSPITAL_COMMUNITY): Payer: Medicare HMO

## 2017-10-06 NOTE — Telephone Encounter (Signed)
Copied from [place] [id number]. Topic: Quick Communication - Rx Refill/Question >> Oct 06, 2017  4:08 PM Oliver Pila B wrote: Pt called b/c he is having all the time nausea and no appetite and wanted to see if pcp could prescribe a medication to help him

## 2017-10-06 NOTE — Telephone Encounter (Signed)
Patient is c/o decreased appetite and transient nausea since his gallbladder removal last year. He contacted his GI doctor but they declined to schedule a follow up. He would like to know if he can get a CT scan ordered.  Reviewed that Dr Marin Olp is seeing him for a possible MGUS and that if his symptoms were related to his gallbladder removal, he needed to follow up with his PCP or surgeon. Patient stated "Well Dr Marin Olp is such a good doctor, I'd just like to have him figure out what this it". Again explained to the patient that this office can only manage, and treat hematological and oncologic issues and that we cannot take the place of the PCP. He reluctantly agreed to follow up with them.

## 2017-10-07 ENCOUNTER — Ambulatory Visit: Payer: Self-pay

## 2017-10-07 DIAGNOSIS — R110 Nausea: Secondary | ICD-10-CM

## 2017-10-07 NOTE — Telephone Encounter (Signed)
Documented in triage encounter.

## 2017-10-07 NOTE — Telephone Encounter (Signed)
Patient called in on yesterday and wanted to know if something can be called in for his nausea. I called the patient to triage his symptoms, he says "I was just in the office on 09/29/17 and I mentioned the nausea to Dr. Ethelene Hal, but nothing came of it. I've had the nausea off and on since before the doctors determined it was my gallbladder. It was removed in August and I'm still having nausea. I haven't lost weight and my appetite is not good sometimes. I called my GI and was told unless I am vomiting and having diarrhea, there was nothing they could do for me. So, I want to know if Dr. Ethelene Hal can prescribe me something to help." I advised I will send this request to Dr. Ethelene Hal and someone will call with his recommendation, he verbalized understanding.   Reason for Disposition . Nausea is a chronic symptom (recurrent or ongoing AND present > 4 weeks)  Answer Assessment - Initial Assessment Questions 1. NAUSEA SEVERITY: "How bad is the nausea?" (e.g., mild, moderate, severe; dehydration, weight loss)   - MILD: loss of appetite without change in eating habits   - MODERATE: decreased oral intake without significant weight loss, dehydration, or malnutrition   - SEVERE: inadequate caloric or fluid intake, significant weight loss, symptoms of dehydration     Mild 2. ONSET: "When did the nausea begin?"     Since my gallbladder was removed 3. VOMITING: "Any vomiting?" If so, ask: "How many times today?"     No 4. RECURRENT SYMPTOM: "Have you had nausea before?" If so, ask: "When was the last time?" "What happened that time?"     Not before I had the gallbladder out 5. CAUSE: "What do you think is causing the nausea?"     I don't know 6. PREGNANCY: "Is there any chance you are pregnant?" (e.g., unprotected intercourse, missed birth control pill, broken condom)     N/A  Protocols used: NAUSEA-A-AH

## 2017-10-07 NOTE — Telephone Encounter (Signed)
Pt wants to see if we can give him something for nausea.

## 2017-10-08 ENCOUNTER — Encounter (HOSPITAL_COMMUNITY): Payer: Medicare HMO

## 2017-10-08 MED ORDER — ONDANSETRON HCL 4 MG PO TABS: 4.0000 mg | ORAL_TABLET | Freq: Three times a day (TID) | ORAL | 0 refills | Status: DC | PRN

## 2017-10-08 NOTE — Telephone Encounter (Signed)
zofran sent to pharm.

## 2017-10-08 NOTE — Addendum Note (Signed)
Addended byShawnie Pons on: 10/08/2017 01:52 PM   Modules accepted: Orders

## 2017-10-10 ENCOUNTER — Encounter (HOSPITAL_COMMUNITY): Payer: Medicare HMO

## 2017-10-13 ENCOUNTER — Encounter (HOSPITAL_COMMUNITY): Payer: Medicare HMO

## 2017-10-15 ENCOUNTER — Encounter (HOSPITAL_COMMUNITY): Payer: Medicare HMO

## 2017-10-17 ENCOUNTER — Encounter (HOSPITAL_COMMUNITY): Payer: Medicare HMO

## 2017-10-20 ENCOUNTER — Encounter (HOSPITAL_COMMUNITY): Payer: Medicare HMO

## 2017-10-21 ENCOUNTER — Other Ambulatory Visit: Payer: Self-pay | Admitting: *Deleted

## 2017-10-22 ENCOUNTER — Other Ambulatory Visit: Payer: Self-pay

## 2017-10-22 ENCOUNTER — Encounter: Payer: Self-pay | Admitting: Hematology & Oncology

## 2017-10-22 ENCOUNTER — Encounter (HOSPITAL_COMMUNITY): Payer: Medicare HMO

## 2017-10-22 ENCOUNTER — Inpatient Hospital Stay: Payer: Medicare HMO

## 2017-10-22 ENCOUNTER — Inpatient Hospital Stay: Payer: Medicare HMO | Attending: Hematology & Oncology | Admitting: Hematology & Oncology

## 2017-10-22 VITALS — BP 103/68 | HR 64 | Temp 98.3°F | Resp 20 | Wt 185.8 lb

## 2017-10-22 DIAGNOSIS — R778 Other specified abnormalities of plasma proteins: Secondary | ICD-10-CM

## 2017-10-22 DIAGNOSIS — Z5112 Encounter for antineoplastic immunotherapy: Secondary | ICD-10-CM | POA: Insufficient documentation

## 2017-10-22 DIAGNOSIS — C9000 Multiple myeloma not having achieved remission: Secondary | ICD-10-CM | POA: Insufficient documentation

## 2017-10-22 DIAGNOSIS — Z7982 Long term (current) use of aspirin: Secondary | ICD-10-CM

## 2017-10-22 DIAGNOSIS — Z7189 Other specified counseling: Secondary | ICD-10-CM

## 2017-10-22 DIAGNOSIS — J029 Acute pharyngitis, unspecified: Secondary | ICD-10-CM | POA: Diagnosis not present

## 2017-10-22 HISTORY — DX: Other specified counseling: Z71.89

## 2017-10-22 HISTORY — DX: Multiple myeloma not having achieved remission: C90.00

## 2017-10-22 LAB — CBC WITH DIFFERENTIAL (CANCER CENTER ONLY)
Basophils Absolute: 0 10*3/uL (ref 0.0–0.1)
Basophils Relative: 1 %
EOS ABS: 0.1 10*3/uL (ref 0.0–0.5)
Eosinophils Relative: 2 %
HCT: 40 % (ref 38.7–49.9)
Hemoglobin: 12.6 g/dL — ABNORMAL LOW (ref 13.0–17.1)
LYMPHS ABS: 1.2 10*3/uL (ref 0.9–3.3)
LYMPHS PCT: 15 %
MCH: 28.4 pg (ref 28.0–33.4)
MCHC: 31.5 g/dL — AB (ref 32.0–35.9)
MCV: 90.3 fL (ref 82.0–98.0)
MONO ABS: 1.1 10*3/uL — AB (ref 0.1–0.9)
MONOS PCT: 14 %
Neutro Abs: 5.4 10*3/uL (ref 1.5–6.5)
Neutrophils Relative %: 68 %
PLATELETS: 142 10*3/uL — AB (ref 145–400)
RBC: 4.43 MIL/uL (ref 4.20–5.70)
RDW: 18.8 % — AB (ref 11.1–15.7)
WBC: 7.8 10*3/uL (ref 4.0–10.0)

## 2017-10-22 LAB — CMP (CANCER CENTER ONLY)
ALT: 35 U/L (ref 0–44)
ANION GAP: 11 (ref 5–15)
AST: 39 U/L (ref 15–41)
Albumin: 3.8 g/dL (ref 3.5–5.0)
Alkaline Phosphatase: 216 U/L — ABNORMAL HIGH (ref 38–126)
BUN: 29 mg/dL — ABNORMAL HIGH (ref 8–23)
CHLORIDE: 101 mmol/L (ref 98–111)
CO2: 32 mmol/L (ref 22–32)
CREATININE: 1.52 mg/dL — AB (ref 0.61–1.24)
Calcium: 9.5 mg/dL (ref 8.9–10.3)
GFR, EST NON AFRICAN AMERICAN: 42 mL/min — AB (ref >60)
GFR, Est AFR Am: 49 mL/min — ABNORMAL LOW (ref >60)
Glucose, Bld: 123 mg/dL — ABNORMAL HIGH (ref 70–99)
POTASSIUM: 4.4 mmol/L (ref 3.5–5.1)
SODIUM: 144 mmol/L (ref 135–145)
Total Bilirubin: 1.5 mg/dL — ABNORMAL HIGH (ref 0.3–1.2)
Total Protein: 7.2 g/dL (ref 6.5–8.1)

## 2017-10-22 MED ORDER — DRONABINOL 2.5 MG PO CAPS
2.5000 mg | ORAL_CAPSULE | Freq: Two times a day (BID) | ORAL | 0 refills | Status: DC
Start: 2017-10-22 — End: 2017-10-22

## 2017-10-22 MED ORDER — FAMCICLOVIR 250 MG PO TABS: 250.0000 mg | ORAL_TABLET | Freq: Every day | ORAL | 8 refills | Status: DC

## 2017-10-22 MED ORDER — LENALIDOMIDE 20 MG PO CAPS: 20.0000 mg | ORAL_CAPSULE | Freq: Every day | ORAL | 0 refills | Status: DC

## 2017-10-22 MED ORDER — LENALIDOMIDE 20 MG PO CAPS: 20.0000 mg | ORAL_CAPSULE | Freq: Every day | ORAL | 5 refills | Status: DC

## 2017-10-22 MED ORDER — DRONABINOL 2.5 MG PO CAPS: 2.5000 mg | ORAL_CAPSULE | Freq: Two times a day (BID) | ORAL | 0 refills | Status: DC

## 2017-10-22 NOTE — Progress Notes (Signed)
Hematology and Oncology Follow Up Visit  Dillon Andrews [id number] 07-20-1940 76 y.o. 10/22/2017   Principle Diagnosis:   Kappa light chain myeloma  Current Therapy:    RVD --  Cycle #1 to start 10/30/2017     Interim History:  Dillon Andrews is back for follow-up.  This is second office visit.  We first saw him, it was not clear as to what was going on with him.  We did a very thorough work-up.  He did have a bone marrow biopsy done.  This was done on September 23, 2017.  The pathology report ([id number]) showed a hypercellular marrow.  There was no obvious clusters of plasma cells.  The cytogenetics were normal.  I did do a 24-hour urine on him.  This showed that he had 107 mg/L of Kappa Lightchain that was monoclonal.  A bone survey was done which was also negative for any obvious myelomatous lesions.  His serum SPEP showed monoclonal spike of 0.5 g/dL.  His IgM level was elevated at 590 mg/dL.  He had normal IgG and IgA levels.  His kappa light chain was 76 mg/L.  He had an erythropoietin level of 58.  This is a tough situation.  I have to believe that he has some element of light chain disease.  I do not think that there is amyloid.  He had a bone marrow stain for amyloid and this was negative.  Cardiologist do not do cardiac biopsies anymore.  I think we have to error on the side of caution and treat him.  I think that this would be reasonable.  I think the only way for [place] to know what is happening with treatment is for his 24-hour urine to be done.  I spent almost an hour with he and his daughter.  All the time was spent face-to-face counseling them and talking to them about the reasons for treatment.  I went over the side effects of treatment.  He is not eating much.  He says he just does not have much of an appetite.  I am not sure as to why he does not have an appetite.  I will try him on some Marinol.  He has had no diarrhea.  He has had no abdominal bloating.  His  cardiologist wants to try him on a new medication for his heart.  This is actually a medication for transthyretin amyloidosis.  The name of the medicine is Vyndaqel.  Currently, his performance status is ECOG 2.  Medications:  Current Outpatient Medications:  .  aspirin EC 81 MG tablet, Take 81 mg by mouth daily., Disp: , Rfl:  .  carvedilol (COREG) 6.25 MG tablet, TAKE 1 TABLET BY MOUTH TWICE A DAY WITH MEALS, Disp: 180 tablet, Rfl: 0 .  gabapentin (NEURONTIN) 800 MG tablet, TAKE 1 TABLET BY MOUTH AT BEDTIME, Disp: 90 tablet, Rfl: 1 .  glucose blood test strip, Use as instructed, Disp: 100 each, Rfl: 12 .  magnesium oxide (MAG-OX) 400 MG tablet, Take 400 mg by mouth daily., Disp: , Rfl:  .  Melatonin 5 MG TABS, Take by mouth at bedtime., Disp: , Rfl:  .  omeprazole (PRILOSEC) 40 MG capsule, Take 1 capsule (40 mg total) by mouth daily., Disp: 90 capsule, Rfl: 1 .  potassium chloride SA (K-DUR,KLOR-CON) 20 MEQ tablet, TAKE 1 TO 2 TABLET DAILY AS DIRECTED, Disp: 180 tablet, Rfl: 3 .  Probiotic Product (PROBIOTIC DAILY PO), Take 1 tablet by mouth., Disp: , Rfl:  .  rosuvastatin (CRESTOR) 10 MG tablet, TAKE 1 TABLET BY MOUTH ONCE DAILY, Disp: 90 tablet, Rfl: 0 .  torsemide (DEMADEX) 20 MG tablet, Take 20 mg by mouth 2 (two) times daily. , Disp: , Rfl:  .  traZODone (DESYREL) 50 MG tablet, Take 1 tablet (50 mg total) by mouth at bedtime., Disp: 30 tablet, Rfl: 2 .  vitamin B-12 (CYANOCOBALAMIN) 500 MCG tablet, Take 500 mcg by mouth every other day., Disp: , Rfl:  .  zolpidem (AMBIEN) 5 MG tablet, Take 1 tablet (5 mg total) by mouth at bedtime as needed for sleep., Disp: 30 tablet, Rfl: 2 .  dronabinol (MARINOL) 2.5 MG capsule, Take 1 capsule (2.5 mg total) by mouth 2 (two) times daily before lunch and supper., Disp: 60 capsule, Rfl: 0 .  famciclovir (FAMVIR) 250 MG tablet, Take 1 tablet (250 mg total) by mouth daily., Disp: 30 tablet, Rfl: 8 .  lenalidomide (REVLIMID) 20 MG capsule, Take 1 capsule  (20 mg total) by mouth daily. Take 1 capsule at bedtime for 21 days on and 7 days off. Swallow whole.  Do not Crush., Disp: 21 capsule, Rfl: 5 .  ondansetron (ZOFRAN) 4 MG tablet, Take 1 tablet (4 mg total) by mouth every 8 (eight) hours as needed for nausea or vomiting. (Patient not taking: Reported on 10/22/2017), Disp: 20 tablet, Rfl: 0  Allergies: No Known Allergies  Past Medical History, Surgical history, Social history, and Family History were reviewed and updated.  Review of Systems: Review of Systems  Constitutional: Positive for appetite change and fatigue.  HENT:  Negative.   Eyes: Negative.   Respiratory: Negative.   Cardiovascular: Negative.   Gastrointestinal: Positive for abdominal pain and nausea.  Endocrine: Negative.   Genitourinary: Negative.    Musculoskeletal: Negative.   Skin: Negative.   Neurological: Negative.   Hematological: Negative.     Physical Exam:  weight is 185 lb 12.8 oz (84.3 kg). His oral temperature is 98.3 F (36.8 C). His blood pressure is 103/68 and his pulse is 64. His respiration is 20 and oxygen saturation is 99%.   Wt Readings from Last 3 Encounters:  10/22/17 185 lb 12.8 oz (84.3 kg)  09/29/17 188 lb 4 oz (85.4 kg)  09/22/17 188 lb 12.8 oz (85.6 kg)    Physical Exam  Constitutional: He is oriented to person, place, and time.  HENT:  Head: Normocephalic and atraumatic.  Mouth/Throat: Oropharynx is clear and moist.  Eyes: Pupils are equal, round, and reactive to light. EOM are normal.  Neck: Normal range of motion.  Cardiovascular: Normal rate, regular rhythm and normal heart sounds.  Pulmonary/Chest: Effort normal and breath sounds normal.  Abdominal: Soft. Bowel sounds are normal.  Musculoskeletal: Normal range of motion. He exhibits no edema, tenderness or deformity.  Lymphadenopathy:    He has no cervical adenopathy.  Neurological: He is alert and oriented to person, place, and time.  Skin: Skin is warm and dry. No rash  noted. No erythema.  Psychiatric: He has a normal mood and affect. His behavior is normal. Judgment and thought content normal.  Vitals reviewed.    Lab Results  Component Value Date   WBC 7.8 10/22/2017   HGB 12.6 (L) 10/22/2017   HCT 40.0 10/22/2017   MCV 90.3 10/22/2017   PLT 142 (L) 10/22/2017     Chemistry      Component Value Date/Time   NA 144 09/23/2017 0712   NA 148 (H) 06/12/2017 0000   K 3.3 (L) 09/23/2017  [id number]   CL 102 09/23/2017 0712   CO2 31 09/23/2017 0712   BUN 32 (H) 09/23/2017 0712   BUN 38 (H) 06/12/2017 0000   CREATININE 1.41 (H) 09/23/2017 0712   CREATININE 1.64 (H) 09/15/2017 1156      Component Value Date/Time   CALCIUM 9.1 09/23/2017 0712   ALKPHOS 178 (H) 09/15/2017 1156   AST 38 (H) 09/15/2017 1156   ALT 30 09/15/2017 1156   BILITOT 1.2 09/15/2017 1156       Impression and Plan: Mr. Iles is a 77 year old white male.  He has kappa light chain disease.  I am not sure if this truly qualifies as myeloma but I believe that given the amount of Kappa Lightchain in his urine, I believe that this is a distinct possibility.  This is a difficult situation.  I suspect that treatment will help him.  I cautioned him on the fact that he is at a higher risk for blood clots with the Revlimid that we use.  I will not treat him with full dose Revlimid but with 20 mg daily dose.  I told him to start taking a full dose aspirin.  I also told him to make sure that he takes a antiacid.  When I examined his mouth, looks like he has some white patches on his soft palate.  I did do a culture.  Again, I spent almost an hour with he and his daughter.  The time was spent reviewing all of his labs, his bone marrow report, his bone survey, and answering the questions.  This is all done face-to-face.  Again we will get started next week.  I will see him back in a month, if not sooner.   Volanda Napoleon, MD 7/10/20191:59 PM

## 2017-10-22 NOTE — Progress Notes (Signed)
START ON PATHWAY REGIMEN - Multiple Myeloma and Other Plasma Cell Dyscrasias     A cycle is every 21 days:     Bortezomib      Lenalidomide      Dexamethasone   **Always confirm dose/schedule in your pharmacy ordering system**  Patient Characteristics: Newly Diagnosed, Transplant Ineligible or Refused, Standard Risk R-ISS Staging: III Disease Classification: Newly Diagnosed Is Patient Eligible for Transplant<= Transplant Ineligible or Refused Risk Status: Standard Risk Intent of Therapy: Non-Curative / Palliative Intent, Discussed with Patient

## 2017-10-23 ENCOUNTER — Encounter (HOSPITAL_COMMUNITY): Payer: Self-pay | Admitting: *Deleted

## 2017-10-23 ENCOUNTER — Telehealth (HOSPITAL_COMMUNITY): Payer: Self-pay | Admitting: *Deleted

## 2017-10-23 ENCOUNTER — Other Ambulatory Visit: Payer: Self-pay | Admitting: Family Medicine

## 2017-10-23 DIAGNOSIS — G4701 Insomnia due to medical condition: Secondary | ICD-10-CM

## 2017-10-23 DIAGNOSIS — I5042 Chronic combined systolic (congestive) and diastolic (congestive) heart failure: Secondary | ICD-10-CM

## 2017-10-23 LAB — IGG, IGA, IGM
IGA: 225 mg/dL (ref 61–437)
IgG (Immunoglobin G), Serum: 892 mg/dL (ref 700–1600)
IgM (Immunoglobulin M), Srm: 648 mg/dL — ABNORMAL HIGH (ref 15–143)

## 2017-10-23 LAB — KAPPA/LAMBDA LIGHT CHAINS
KAPPA FREE LGHT CHN: 76.3 mg/L — AB (ref 3.3–19.4)
KAPPA, LAMDA LIGHT CHAIN RATIO: 3.3 — AB (ref 0.26–1.65)
LAMDA FREE LIGHT CHAINS: 23.1 mg/L (ref 5.7–26.3)

## 2017-10-23 NOTE — Progress Notes (Signed)
Discharge Progress Report  Patient Details  Name: Dillon Andrews MRN: [id number] Date of Birth: 1940-12-30 Referring Provider:     [place] from 09/18/2017 in [place]  Referring Provider  Bensimhon, Shaune Pascal MD        Number of Visits: 3  Reason for Discharge:  Early Exit:  Discharged due to medical issues  Smoking History:  Social History   Tobacco Use  Smoking Status Former Smoker  . Packs/day: 0.30  . Years: 15.00  . Pack years: 4.50  . Types: Cigarettes  . Last attempt to quit: 03/17/1977  . Years since quitting: 40.6  Smokeless Tobacco Never Used    Diagnosis:  Heart failure, systolic and diastolic, chronic (HCC)  ADL UCSD:   Initial Exercise Prescription: Initial Exercise Prescription - 09/18/17 1600      Date of Initial Exercise RX and Referring Provider   Date  09/18/17    Referring Provider  Bensimhon, Shaune Pascal MD       Recumbant Bike   Level  1    Watts  10    Minutes  10    METs  1.5      NuStep   Level  1    SPM  75    Minutes  10    METs  1.4      Track   Laps  6    Minutes  10    METs  2      Prescription Details   Frequency (times per week)  3x    Duration  Progress to 30 minutes of continuous aerobic without signs/symptoms of physical distress      Intensity   THRR 40-80% of Max Heartrate  54-114    Ratings of Perceived Exertion  11-13    Perceived Dyspnea  0-4      Progression   Progression  Continue progressive overload as per policy without signs/symptoms or physical distress.      Resistance Training   Training Prescription  Yes    Weight  3lbs    Reps  10-15       Discharge Exercise Prescription (Final Exercise Prescription Changes): Exercise Prescription Changes - 09/29/17 1451      Response to Exercise   Blood Pressure (Admit)  120/68    Blood Pressure (Exercise)  120/68    Blood Pressure (Exit)  110/76    Heart Rate (Admit)  75 bpm    Heart Rate  (Exercise)  84 bpm    Heart Rate (Exit)  70 bpm    Rating of Perceived Exertion (Exercise)  13    Symptoms  none    Duration  Progress to 30 minutes of  aerobic without signs/symptoms of physical distress    Intensity  THRR unchanged      Progression   Progression  Continue to progress workloads to maintain intensity without signs/symptoms of physical distress.    Average METs  2      Resistance Training   Training Prescription  Yes    Weight  3lbs    Reps  10-15    Time  10 Minutes      Interval Training   Interval Training  No      Recumbant Bike   Level  1    Minutes  10    METs  1.6      NuStep   Level  1    SPM  75  Minutes  10    METs  2.1      Track   Laps  7    Minutes  10    METs  2.22       Functional Capacity: 6 Minute Walk    Row Name 09/18/17 1619         6 Minute Walk   Phase  Initial     Distance  900 feet     Walk Time  6 minutes     # of Rest Breaks  0     MPH  1.7     METS  1.57     RPE  13     Perceived Dyspnea   0     VO2 Peak  5.51     Symptoms  Yes (comment)     Comments  Leg Fatigue +8     Resting HR  77 bpm     Resting BP  104/62     Resting Oxygen Saturation   96 %     Exercise Oxygen Saturation  during 6 min walk  96 %     Max Ex. HR  86 bpm     Max Ex. BP  118/64     2 Minute Post BP  102/60        Psychological, QOL, Others - Outcomes: PHQ 2/9: Depression screen [facility] 2/9 09/26/2017 04/03/2017 03/05/2017  Decreased Interest 0 0 0  Down, Depressed, Hopeless 0 2 0  PHQ - 2 Score 0 2 0  Altered sleeping - 3 -  Tired, decreased energy - 2 -  Change in appetite - 0 -  Feeling bad or failure about yourself  - 0 -  Trouble concentrating - 0 -  Moving slowly or fidgety/restless - 0 -  Suicidal thoughts - 0 -  PHQ-9 Score - 7 -    Quality of Life: Quality of Life - 09/18/17 1630      Quality of Life Scores   Health/Function Pre  23.35 %    Socioeconomic Pre  26.8 %    Psych/Spiritual Pre  26.14 %    Family Pre   26.4 %    GLOBAL Pre  25.08 %       Personal Goals: Goals established at orientation with interventions provided to work toward goal. Personal Goals and Risk Factors at Admission - 09/18/17 1618      Core Components/Risk Factors/Patient Goals on Admission    Weight Management  Yes;Weight Loss    Intervention  Weight Management: Develop a combined nutrition and exercise program designed to reach desired caloric intake, while maintaining appropriate intake of nutrient and fiber, sodium and fats, and appropriate energy expenditure required for the weight goal.;Weight Management: Provide education and appropriate resources to help participant work on and attain dietary goals.;Weight Management/Obesity: Establish reasonable short term and long term weight goals.    Admit Weight  187 lb 2.7 oz (84.9 kg)    Goal Weight: Short Term  182 lb (82.6 kg)    Goal Weight: Long Term  177 lb (80.3 kg)    Expected Outcomes  Short Term: Continue to assess and modify interventions until short term weight is achieved;Long Term: Adherence to nutrition and physical activity/exercise program aimed toward attainment of established weight goal;Weight Loss: Understanding of general recommendations for a balanced deficit meal plan, which promotes 1-2 lb weight loss per week and includes a negative energy balance of [phone] kcal/d;Understanding recommendations for meals to include 15-35% energy as  protein, 25-35% energy from fat, 35-60% energy from carbohydrates, less than 227m of dietary cholesterol, 20-35 gm of total fiber daily;Understanding of distribution of calorie intake throughout the day with the consumption of 4-5 meals/snacks    Diabetes  Yes    Intervention  Provide education about signs/symptoms and action to take for hypo/hyperglycemia.;Provide education about proper nutrition, including hydration, and aerobic/resistive exercise prescription along with prescribed medications to achieve blood glucose in normal  ranges: Fasting glucose 65-99 mg/dL    Expected Outcomes  Short Term: Participant verbalizes understanding of the signs/symptoms and immediate care of hyper/hypoglycemia, proper foot care and importance of medication, aerobic/resistive exercise and nutrition plan for blood glucose control.;Long Term: Attainment of HbA1C < 7%.    Heart Failure  Yes    Intervention  Provide a combined exercise and nutrition program that is supplemented with education, support and counseling about heart failure. Directed toward relieving symptoms such as shortness of breath, decreased exercise tolerance, and extremity edema.    Expected Outcomes  Improve functional capacity of life;Short term: Attendance in program 2-3 days a week with increased exercise capacity. Reported lower sodium intake. Reported increased fruit and vegetable intake. Reports medication compliance.;Short term: Daily weights obtained and reported for increase. Utilizing diuretic protocols set by physician.;Long term: Adoption of self-care skills and reduction of barriers for early signs and symptoms recognition and intervention leading to self-care maintenance.    Hypertension  Yes    Intervention  Provide education on lifestyle modifcations including regular physical activity/exercise, weight management, moderate sodium restriction and increased consumption of fresh fruit, vegetables, and low fat dairy, alcohol moderation, and smoking cessation.;Monitor prescription use compliance.    Expected Outcomes  Short Term: Continued assessment and intervention until BP is < 140/939mHG in hypertensive participants. < 130/8033mG in hypertensive participants with diabetes, heart failure or chronic kidney disease.;Long Term: Maintenance of blood pressure at goal levels.    Lipids  Yes    Intervention  Provide education and support for participant on nutrition & aerobic/resistive exercise along with prescribed medications to achieve LDL <23m47mDL >40mg76m  Expected Outcomes  Short Term: Participant states understanding of desired cholesterol values and is compliant with medications prescribed. Participant is following exercise prescription and nutrition guidelines.;Long Term: Cholesterol controlled with medications as prescribed, with individualized exercise RX and with personalized nutrition plan. Value goals: LDL < 23mg,36m > 40 mg.        Personal Goals Discharge:   Exercise Goals and Review: Exercise Goals    Row Name 09/18/17 1627             Exercise Goals   Increase Physical Activity  Yes       Intervention  Provide advice, education, support and counseling about physical activity/exercise needs.;Develop an individualized exercise prescription for aerobic and resistive training based on initial evaluation findings, risk stratification, comorbidities and participant's personal goals.       Expected Outcomes  Long Term: Add in home exercise to make exercise part of routine and to increase amount of physical activity.;Long Term: Exercising regularly at least 3-5 days a week.;Short Term: Attend rehab on a regular basis to increase amount of physical activity.       Increase Strength and Stamina  Yes       Intervention  Provide advice, education, support and counseling about physical activity/exercise needs.;Develop an individualized exercise prescription for aerobic and resistive training based on initial evaluation findings, risk stratification, comorbidities and participant's personal goals.  Expected Outcomes  Short Term: Perform resistance training exercises routinely during rehab and add in resistance training at home;Long Term: Improve cardiorespiratory fitness, muscular endurance and strength as measured by increased METs and functional capacity (6MWT);Short Term: Increase workloads from initial exercise prescription for resistance, speed, and METs.       Able to understand and use rate of perceived exertion (RPE) scale  Yes        Intervention  Provide education and explanation on how to use RPE scale       Expected Outcomes  Short Term: Able to use RPE daily in rehab to express subjective intensity level;Long Term:  Able to use RPE to guide intensity level when exercising independently       Able to understand and use Dyspnea scale  Yes       Intervention  Provide education and explanation on how to use Dyspnea scale       Expected Outcomes  Short Term: Able to use Dyspnea scale daily in rehab to express subjective sense of shortness of breath during exertion;Long Term: Able to use Dyspnea scale to guide intensity level when exercising independently       Knowledge and understanding of Target Heart Rate Range (THRR)  Yes       Intervention  Provide education and explanation of THRR including how the numbers were predicted and where they are located for reference       Expected Outcomes  Short Term: Able to state/look up THRR;Short Term: Able to use daily as guideline for intensity in rehab;Long Term: Able to use THRR to govern intensity when exercising independently       Able to check pulse independently  Yes       Intervention  Provide education and demonstration on how to check pulse in carotid and radial arteries.;Review the importance of being able to check your own pulse for safety during independent exercise       Expected Outcomes  Long Term: Able to check pulse independently and accurately;Short Term: Able to explain why pulse checking is important during independent exercise       Understanding of Exercise Prescription  Yes       Intervention  Provide education, explanation, and written materials on patient's individual exercise prescription       Expected Outcomes  Short Term: Able to explain program exercise prescription;Long Term: Able to explain home exercise prescription to exercise independently          Nutrition & Weight - Outcomes: Pre Biometrics - 09/18/17 1628      Pre Biometrics   Height  5' 9"   (1.753 m)    Weight  187 lb 2.7 oz (84.9 kg)    Waist Circumference  41 inches    Hip Circumference  41.5 inches    Waist to Hip Ratio  0.99 %    BMI (Calculated)  27.63    Triceps Skinfold  9 mm    % Body Fat  26.2 %    Grip Strength  40 kg    Flexibility  0 in    Single Leg Stand  2 seconds        Nutrition: Nutrition Therapy & Goals - 10/29/17 1118      Nutrition Therapy   Diet  Consistent Carb, Heart Healthy      Personal Nutrition Goals   Nutrition Goal  Pt to identify food quantities necessary to achieve weight gain of ~4 lb at graduation from cardiac rehab. Goal wt  of 190 lb desired.       Intervention Plan   Intervention  Prescribe, educate and counsel regarding individualized specific dietary modifications aiming towards targeted core components such as weight, hypertension, lipid management, diabetes, heart failure and other comorbidities.;Nutrition handout(s) given to patient. suggestions for increasing kcal and protein    Expected Outcomes  Short Term Goal: Understand basic principles of dietary content, such as calories, fat, sodium, cholesterol and nutrients.;Long Term Goal: Adherence to prescribed nutrition plan.       Nutrition Discharge: Nutrition Assessments - 10/29/17 1112      MEDFICTS Scores   Pre Score  30    Post Score  -- no survey available to assess at this time       Education Questionnaire Score: Knowledge Questionnaire Score - 09/18/17 1629      Knowledge Questionnaire Score   Pre Score  21/24       Dillon Andrews attended 3 exercise sessions between 09/18/17-09/29/17. Dillon Andrews will not be able to participate due to his current state of health.Barnet Pall, RN,BSN 11/03/2017 9:08 AM

## 2017-10-24 ENCOUNTER — Encounter (HOSPITAL_COMMUNITY): Payer: Medicare HMO

## 2017-10-24 LAB — PROTEIN ELECTROPHORESIS, SERUM, WITH REFLEX
A/G RATIO SPE: 1.1 (ref 0.7–1.7)
ALPHA-2-GLOBULIN: 0.6 g/dL (ref 0.4–1.0)
Albumin ELP: 3.5 g/dL (ref 2.9–4.4)
Alpha-1-Globulin: 0.3 g/dL (ref 0.0–0.4)
BETA GLOBULIN: 0.9 g/dL (ref 0.7–1.3)
GLOBULIN, TOTAL: 3.1 g/dL (ref 2.2–3.9)
Gamma Globulin: 1.3 g/dL (ref 0.4–1.8)
M-SPIKE, %: 0.6 g/dL — AB
SPEP INTERP: 0
Total Protein ELP: 6.6 g/dL (ref 6.0–8.5)

## 2017-10-24 LAB — IMMUNOFIXATION REFLEX, SERUM
IGM (IMMUNOGLOBULIN M), SRM: 638 mg/dL — AB (ref 15–143)
IgA: 229 mg/dL (ref 61–437)
IgG (Immunoglobin G), Serum: 927 mg/dL (ref 700–1600)

## 2017-10-27 ENCOUNTER — Encounter (HOSPITAL_COMMUNITY): Payer: Medicare HMO

## 2017-10-27 ENCOUNTER — Other Ambulatory Visit: Payer: Self-pay | Admitting: Cardiology

## 2017-10-28 ENCOUNTER — Other Ambulatory Visit: Payer: Self-pay | Admitting: Family Medicine

## 2017-10-28 NOTE — Telephone Encounter (Signed)
Rx sent to pharmacy   

## 2017-10-29 ENCOUNTER — Encounter (HOSPITAL_COMMUNITY): Payer: Medicare HMO

## 2017-10-29 ENCOUNTER — Other Ambulatory Visit: Payer: Self-pay

## 2017-10-29 ENCOUNTER — Other Ambulatory Visit: Payer: Self-pay | Admitting: Family Medicine

## 2017-10-29 DIAGNOSIS — C9000 Multiple myeloma not having achieved remission: Secondary | ICD-10-CM

## 2017-10-29 NOTE — Addendum Note (Signed)
Encounter addended by: Ivonne Andrew, RD on: 10/29/2017 11:18 AM  Actions taken: Flowsheet data copied forward, Visit Navigator Flowsheet section accepted

## 2017-10-29 NOTE — Addendum Note (Signed)
Encounter addended by: Ivonne Andrew, RD on: 10/29/2017 11:17 AM  Actions taken: Flowsheet data copied forward, Visit Navigator Flowsheet section accepted

## 2017-10-30 ENCOUNTER — Inpatient Hospital Stay: Payer: Medicare HMO

## 2017-10-30 ENCOUNTER — Telehealth: Payer: Self-pay | Admitting: *Deleted

## 2017-10-30 ENCOUNTER — Telehealth: Payer: Self-pay | Admitting: Pharmacist

## 2017-10-30 ENCOUNTER — Other Ambulatory Visit: Payer: Self-pay

## 2017-10-30 DIAGNOSIS — J029 Acute pharyngitis, unspecified: Secondary | ICD-10-CM | POA: Diagnosis not present

## 2017-10-30 DIAGNOSIS — R778 Other specified abnormalities of plasma proteins: Secondary | ICD-10-CM | POA: Diagnosis not present

## 2017-10-30 DIAGNOSIS — Z7982 Long term (current) use of aspirin: Secondary | ICD-10-CM | POA: Diagnosis not present

## 2017-10-30 DIAGNOSIS — Z5112 Encounter for antineoplastic immunotherapy: Secondary | ICD-10-CM | POA: Diagnosis not present

## 2017-10-30 DIAGNOSIS — C9000 Multiple myeloma not having achieved remission: Secondary | ICD-10-CM

## 2017-10-30 LAB — CBC WITH DIFFERENTIAL (CANCER CENTER ONLY)
BASOS ABS: 0 10*3/uL (ref 0.0–0.1)
BASOS PCT: 0 %
Eosinophils Absolute: 0.1 10*3/uL (ref 0.0–0.5)
Eosinophils Relative: 1 %
HEMATOCRIT: 39.3 % (ref 38.7–49.9)
Hemoglobin: 12.3 g/dL — ABNORMAL LOW (ref 13.0–17.1)
LYMPHS PCT: 15 %
Lymphs Abs: 1.3 10*3/uL (ref 0.9–3.3)
MCH: 28.3 pg (ref 28.0–33.4)
MCHC: 31.3 g/dL — ABNORMAL LOW (ref 32.0–35.9)
MCV: 90.3 fL (ref 82.0–98.0)
MONO ABS: 1.1 10*3/uL — AB (ref 0.1–0.9)
Monocytes Relative: 13 %
NEUTROS ABS: 6.2 10*3/uL (ref 1.5–6.5)
Neutrophils Relative %: 71 %
PLATELETS: 152 10*3/uL (ref 145–400)
RBC: 4.35 MIL/uL (ref 4.20–5.70)
RDW: 18.6 % — AB (ref 11.1–15.7)
WBC: 8.8 10*3/uL (ref 4.0–10.0)

## 2017-10-30 LAB — CMP (CANCER CENTER ONLY)
ALBUMIN: 3.3 g/dL — AB (ref 3.5–5.0)
ALT: 34 U/L (ref 10–47)
AST: 39 U/L — AB (ref 11–38)
Alkaline Phosphatase: 170 U/L — ABNORMAL HIGH (ref 26–84)
Anion gap: 5 (ref 5–15)
BILIRUBIN TOTAL: 1.4 mg/dL (ref 0.2–1.6)
BUN: 28 mg/dL — ABNORMAL HIGH (ref 7–22)
CALCIUM: 9.2 mg/dL (ref 8.0–10.3)
CO2: 34 mmol/L — AB (ref 18–33)
CREATININE: 1.3 mg/dL — AB (ref 0.60–1.20)
Chloride: 103 mmol/L (ref 98–108)
GLUCOSE: 173 mg/dL — AB (ref 73–118)
Potassium: 4.7 mmol/L (ref 3.3–4.7)
Sodium: 142 mmol/L (ref 128–145)
TOTAL PROTEIN: 6.7 g/dL (ref 6.4–8.1)

## 2017-10-30 MED ORDER — PROCHLORPERAZINE MALEATE 10 MG PO TABS
ORAL_TABLET | ORAL | Status: AC
  Filled 2017-10-30: qty 1

## 2017-10-30 MED ORDER — DEXAMETHASONE 4 MG PO TABS: 20.0000 mg | ORAL_TABLET | ORAL | 0 refills | Status: DC

## 2017-10-30 MED ORDER — BORTEZOMIB CHEMO SQ INJECTION 3.5 MG (2.5MG/ML)
1.3000 mg/m2 | Freq: Once | INTRAMUSCULAR | Status: AC
  Administered 2017-10-30: 2.75 mg via SUBCUTANEOUS
  Filled 2017-10-30: qty 1.1

## 2017-10-30 MED ORDER — PROCHLORPERAZINE MALEATE 10 MG PO TABS
10.0000 mg | ORAL_TABLET | Freq: Once | ORAL | Status: AC
  Administered 2017-10-30: 10 mg via ORAL

## 2017-10-30 NOTE — Telephone Encounter (Signed)
Called [facility] to find out the status of patients REvlimid.  [facility] transferred Rx to [facility] per Patients insurance demands.

## 2017-10-30 NOTE — Telephone Encounter (Signed)
Oral Oncology Pharmacist Encounter   Received notification from [facility] that prior authorization for Revlimid is required.   PA submitted on CoverMyMeds Key [id number]  Status is pending   Oral Oncology Clinic will continue to follow.   Darl Pikes, PharmD, BCPS, [facility] Hematology/Oncology Clinical Pharmacist [facility] [place] [facility] [phone]  10/30/2017 4:11 PM

## 2017-10-30 NOTE — Patient Instructions (Addendum)
PICK UP YOUR DECADRON FROM [facility] AND FOLLOW THE DIRECTIONS ON THE BOTTLE FOR TAKING IT ONCE A WEEK.  INCREASE YOUR MARINOL TO 5MG - SO IT IS 2 TABLETS BEFORE LUNCH AND 2 TABLETS BEFORE DINNER.  Bortezomib injection What is this medicine? BORTEZOMIB (bor TEZ oh mib) is a medicine that targets proteins in cancer cells and stops the cancer cells from growing. It is used to treat multiple myeloma and mantle-cell lymphoma. This medicine may be used for other purposes; ask your health care provider or pharmacist if you have questions. COMMON BRAND NAME(S): Velcade What should I tell my health care provider before I take this medicine? They need to know if you have any of these conditions: -diabetes -heart disease -irregular heartbeat -liver disease -on hemodialysis -low blood counts, like low white blood cells, platelets, or hemoglobin -peripheral neuropathy -taking medicine for blood pressure -an unusual or allergic reaction to bortezomib, mannitol, boron, other medicines, foods, dyes, or preservatives -pregnant or trying to get pregnant -breast-feeding How should I use this medicine? This medicine is for injection into a vein or for injection under the skin. It is given by a health care professional in a hospital or clinic setting. Talk to your pediatrician regarding the use of this medicine in children. Special care may be needed. Overdosage: If you think you have taken too much of this medicine contact a poison control center or emergency room at once. NOTE: This medicine is only for you. Do not share this medicine with others. What if I miss a dose? It is important not to miss your dose. Call your doctor or health care professional if you are unable to keep an appointment. What may interact with this medicine? This medicine may interact with the following medications: -ketoconazole -rifampin -ritonavir -St. John's Wort This list may not describe all possible interactions. Give  your health care provider a list of all the medicines, herbs, non-prescription drugs, or dietary supplements you use. Also tell them if you smoke, drink alcohol, or use illegal drugs. Some items may interact with your medicine. What should I watch for while using this medicine? You may get drowsy or dizzy. Do not drive, use machinery, or do anything that needs mental alertness until you know how this medicine affects you. Do not stand or sit up quickly, especially if you are an older patient. This reduces the risk of dizzy or fainting spells. In some cases, you may be given additional medicines to help with side effects. Follow all directions for their use. Call your doctor or health care professional for advice if you get a fever, chills or sore throat, or other symptoms of a cold or flu. Do not treat yourself. This drug decreases your body's ability to fight infections. Try to avoid being around people who are sick. This medicine may increase your risk to bruise or bleed. Call your doctor or health care professional if you notice any unusual bleeding. You may need blood work done while you are taking this medicine. In some patients, this medicine may cause a serious brain infection that may cause death. If you have any problems seeing, thinking, speaking, walking, or standing, tell your doctor right away. If you cannot reach your doctor, urgently seek other source of medical care. Check with your doctor or health care professional if you get an attack of severe diarrhea, nausea and vomiting, or if you sweat a lot. The loss of too much body fluid can make it dangerous for you to take  this medicine. Do not become pregnant while taking this medicine or for at least 2 months after stopping it. Women should inform their doctor if they wish to become pregnant or think they might be pregnant. Men should not father a child while taking this medicine and for at least 2 months after stopping it. There is a  potential for serious side effects to an unborn child. Talk to your health care professional or pharmacist for more information. Do not breast-feed an infant while taking this medicine or for 2 months after stopping it. This medicine may interfere with the ability to have a child. You should talk with your doctor or health care professional if you are concerned about your fertility. What side effects may I notice from receiving this medicine? Side effects that you should report to your doctor or health care professional as soon as possible: -allergic reactions like skin rash, itching or hives, swelling of the face, lips, or tongue -breathing problems -changes in hearing -changes in vision -fast, irregular heartbeat -feeling faint or lightheaded, falls -pain, tingling, numbness in the hands or feet -right upper belly pain -seizures -swelling of the ankles, feet, hands -unusual bleeding or bruising -unusually weak or tired -vomiting -yellowing of the eyes or skin Side effects that usually do not require medical attention (report to your doctor or health care professional if they continue or are bothersome): -changes in emotions or moods -constipation -diarrhea -loss of appetite -headache -irritation at site where injected -nausea This list may not describe all possible side effects. Call your doctor for medical advice about side effects. You may report side effects to FDA at 1-800-FDA-1088. Where should I keep my medicine? This drug is given in a hospital or clinic and will not be stored at home. NOTE: This sheet is a summary. It may not cover all possible information. If you have questions about this medicine, talk to your doctor, pharmacist, or health care provider.  2018 Elsevier/Gold Standard (2016-02-29 15:53:51) [facility] Discharge Instructions for Patients Receiving Chemotherapy  Today you received the following chemotherapy agents Velcade.  To help prevent  nausea and vomiting after your treatment, we encourage you to take your nausea medication as directed - your Zofran you have at home   If you develop nausea and vomiting that is not controlled by your nausea medication, call the clinic.   BELOW ARE SYMPTOMS THAT SHOULD BE REPORTED IMMEDIATELY:  *FEVER GREATER THAN 100.5 F  *CHILLS WITH OR WITHOUT FEVER  NAUSEA AND VOMITING THAT IS NOT CONTROLLED WITH YOUR NAUSEA MEDICATION  *UNUSUAL SHORTNESS OF BREATH  *UNUSUAL BRUISING OR BLEEDING  TENDERNESS IN MOUTH AND THROAT WITH OR WITHOUT PRESENCE OF ULCERS  *URINARY PROBLEMS  *BOWEL PROBLEMS  UNUSUAL RASH Items with * indicate a potential emergency and should be followed up as soon as possible.  Feel free to call the clinic you have any questions or concerns. The clinic phone number is [phone].  Please show the [facility] at check-in to the Emergency Department and triage nurse.

## 2017-10-31 ENCOUNTER — Encounter (HOSPITAL_COMMUNITY): Payer: Medicare HMO

## 2017-10-31 ENCOUNTER — Telehealth: Payer: Self-pay

## 2017-10-31 NOTE — Telephone Encounter (Signed)
Spoke with Dillon Andrews re: 1st time Velcade yesterday. Dillon Andrews denies any change in appetite, bowel function or energy level since receiving chemo yesterday. Dillon Andrews did increase Marinol as suggested by Dr Marin Olp but has had no noticeable change at this time. Aware of how to reach our office and on call MD as needed. dph

## 2017-10-31 NOTE — Telephone Encounter (Signed)
Oral Oncology Patient Advocate Encounter  Prior Authorization for Revlimid has been approved.    PA# [id number]  Effective dates: 10/31/17 through 04/29/2018  Oral Oncology Clinic will continue to follow.   [place] Patient Jessie Phone [phone] Fax [phone] 10/31/2017 9:35 AM

## 2017-11-03 ENCOUNTER — Encounter (HOSPITAL_COMMUNITY): Payer: Medicare HMO

## 2017-11-05 ENCOUNTER — Other Ambulatory Visit: Payer: Self-pay

## 2017-11-05 ENCOUNTER — Encounter (HOSPITAL_COMMUNITY): Payer: Medicare HMO

## 2017-11-05 ENCOUNTER — Inpatient Hospital Stay (HOSPITAL_COMMUNITY)
Admission: EM | Admit: 2017-11-05 | Discharge: 2017-11-27 | DRG: 291 | Disposition: A | Discharge status: Home Health | Payer: Medicare HMO | Attending: Family Medicine | Admitting: Family Medicine

## 2017-11-05 ENCOUNTER — Telehealth (HOSPITAL_COMMUNITY): Payer: Self-pay | Admitting: *Deleted

## 2017-11-05 ENCOUNTER — Emergency Department (HOSPITAL_COMMUNITY): Payer: Medicare HMO

## 2017-11-05 ENCOUNTER — Encounter (HOSPITAL_COMMUNITY): Payer: Self-pay | Admitting: *Deleted

## 2017-11-05 DIAGNOSIS — N183 Chronic kidney disease, stage 3 unspecified: Secondary | ICD-10-CM | POA: Diagnosis present

## 2017-11-05 DIAGNOSIS — R0789 Other chest pain: Secondary | ICD-10-CM | POA: Diagnosis not present

## 2017-11-05 DIAGNOSIS — R531 Weakness: Secondary | ICD-10-CM | POA: Diagnosis not present

## 2017-11-05 DIAGNOSIS — D62 Acute posthemorrhagic anemia: Secondary | ICD-10-CM | POA: Diagnosis not present

## 2017-11-05 DIAGNOSIS — N179 Acute kidney failure, unspecified: Secondary | ICD-10-CM | POA: Diagnosis present

## 2017-11-05 DIAGNOSIS — Y848 Other medical procedures as the cause of abnormal reaction of the patient, or of later complication, without mention of misadventure at the time of the procedure: Secondary | ICD-10-CM | POA: Diagnosis present

## 2017-11-05 DIAGNOSIS — I481 Persistent atrial fibrillation: Secondary | ICD-10-CM | POA: Diagnosis present

## 2017-11-05 DIAGNOSIS — I130 Hypertensive heart and chronic kidney disease with heart failure and stage 1 through stage 4 chronic kidney disease, or unspecified chronic kidney disease: Secondary | ICD-10-CM | POA: Diagnosis not present

## 2017-11-05 DIAGNOSIS — J90 Pleural effusion, not elsewhere classified: Secondary | ICD-10-CM | POA: Diagnosis not present

## 2017-11-05 DIAGNOSIS — I081 Rheumatic disorders of both mitral and tricuspid valves: Secondary | ICD-10-CM | POA: Diagnosis not present

## 2017-11-05 DIAGNOSIS — E854 Organ-limited amyloidosis: Secondary | ICD-10-CM | POA: Diagnosis present

## 2017-11-05 DIAGNOSIS — Z515 Encounter for palliative care: Secondary | ICD-10-CM

## 2017-11-05 DIAGNOSIS — I480 Paroxysmal atrial fibrillation: Secondary | ICD-10-CM | POA: Diagnosis not present

## 2017-11-05 DIAGNOSIS — I5023 Acute on chronic systolic (congestive) heart failure: Secondary | ICD-10-CM

## 2017-11-05 DIAGNOSIS — Z452 Encounter for adjustment and management of vascular access device: Secondary | ICD-10-CM | POA: Diagnosis not present

## 2017-11-05 DIAGNOSIS — K7460 Unspecified cirrhosis of liver: Secondary | ICD-10-CM | POA: Diagnosis not present

## 2017-11-05 DIAGNOSIS — Z7901 Long term (current) use of anticoagulants: Secondary | ICD-10-CM | POA: Diagnosis not present

## 2017-11-05 DIAGNOSIS — R0602 Shortness of breath: Secondary | ICD-10-CM | POA: Diagnosis present

## 2017-11-05 DIAGNOSIS — R7989 Other specified abnormal findings of blood chemistry: Secondary | ICD-10-CM | POA: Diagnosis present

## 2017-11-05 DIAGNOSIS — I340 Nonrheumatic mitral (valve) insufficiency: Secondary | ICD-10-CM | POA: Diagnosis not present

## 2017-11-05 DIAGNOSIS — E785 Hyperlipidemia, unspecified: Secondary | ICD-10-CM | POA: Diagnosis present

## 2017-11-05 DIAGNOSIS — I4892 Unspecified atrial flutter: Secondary | ICD-10-CM | POA: Diagnosis not present

## 2017-11-05 DIAGNOSIS — T80211A Bloodstream infection due to central venous catheter, initial encounter: Secondary | ICD-10-CM | POA: Diagnosis not present

## 2017-11-05 DIAGNOSIS — R778 Other specified abnormalities of plasma proteins: Secondary | ICD-10-CM | POA: Diagnosis present

## 2017-11-05 DIAGNOSIS — Z9221 Personal history of antineoplastic chemotherapy: Secondary | ICD-10-CM

## 2017-11-05 DIAGNOSIS — I482 Chronic atrial fibrillation: Secondary | ICD-10-CM | POA: Diagnosis not present

## 2017-11-05 DIAGNOSIS — Z7982 Long term (current) use of aspirin: Secondary | ICD-10-CM

## 2017-11-05 DIAGNOSIS — I9589 Other hypotension: Secondary | ICD-10-CM | POA: Diagnosis present

## 2017-11-05 DIAGNOSIS — I959 Hypotension, unspecified: Secondary | ICD-10-CM | POA: Diagnosis present

## 2017-11-05 DIAGNOSIS — E876 Hypokalemia: Secondary | ICD-10-CM | POA: Diagnosis not present

## 2017-11-05 DIAGNOSIS — D72829 Elevated white blood cell count, unspecified: Secondary | ICD-10-CM | POA: Diagnosis present

## 2017-11-05 DIAGNOSIS — I43 Cardiomyopathy in diseases classified elsewhere: Secondary | ICD-10-CM | POA: Diagnosis present

## 2017-11-05 DIAGNOSIS — R748 Abnormal levels of other serum enzymes: Secondary | ICD-10-CM

## 2017-11-05 DIAGNOSIS — K219 Gastro-esophageal reflux disease without esophagitis: Secondary | ICD-10-CM | POA: Diagnosis present

## 2017-11-05 DIAGNOSIS — I371 Nonrheumatic pulmonary valve insufficiency: Secondary | ICD-10-CM | POA: Diagnosis not present

## 2017-11-05 DIAGNOSIS — Z87891 Personal history of nicotine dependence: Secondary | ICD-10-CM

## 2017-11-05 DIAGNOSIS — R109 Unspecified abdominal pain: Secondary | ICD-10-CM | POA: Diagnosis not present

## 2017-11-05 DIAGNOSIS — R570 Cardiogenic shock: Secondary | ICD-10-CM | POA: Diagnosis not present

## 2017-11-05 DIAGNOSIS — Z8249 Family history of ischemic heart disease and other diseases of the circulatory system: Secondary | ICD-10-CM

## 2017-11-05 DIAGNOSIS — Z79890 Hormone replacement therapy (postmenopausal): Secondary | ICD-10-CM

## 2017-11-05 DIAGNOSIS — F329 Major depressive disorder, single episode, unspecified: Secondary | ICD-10-CM | POA: Diagnosis present

## 2017-11-05 DIAGNOSIS — I8510 Secondary esophageal varices without bleeding: Secondary | ICD-10-CM | POA: Diagnosis not present

## 2017-11-05 DIAGNOSIS — E1122 Type 2 diabetes mellitus with diabetic chronic kidney disease: Secondary | ICD-10-CM | POA: Diagnosis not present

## 2017-11-05 DIAGNOSIS — D696 Thrombocytopenia, unspecified: Secondary | ICD-10-CM | POA: Diagnosis present

## 2017-11-05 DIAGNOSIS — C9000 Multiple myeloma not having achieved remission: Secondary | ICD-10-CM | POA: Diagnosis not present

## 2017-11-05 DIAGNOSIS — R1010 Upper abdominal pain, unspecified: Secondary | ICD-10-CM | POA: Diagnosis not present

## 2017-11-05 DIAGNOSIS — R140 Abdominal distension (gaseous): Secondary | ICD-10-CM

## 2017-11-05 DIAGNOSIS — E119 Type 2 diabetes mellitus without complications: Secondary | ICD-10-CM | POA: Diagnosis not present

## 2017-11-05 DIAGNOSIS — I2729 Other secondary pulmonary hypertension: Secondary | ICD-10-CM | POA: Diagnosis present

## 2017-11-05 DIAGNOSIS — R918 Other nonspecific abnormal finding of lung field: Secondary | ICD-10-CM | POA: Diagnosis not present

## 2017-11-05 DIAGNOSIS — I4891 Unspecified atrial fibrillation: Secondary | ICD-10-CM | POA: Diagnosis not present

## 2017-11-05 DIAGNOSIS — I4510 Unspecified right bundle-branch block: Secondary | ICD-10-CM | POA: Diagnosis present

## 2017-11-05 DIAGNOSIS — K7581 Nonalcoholic steatohepatitis (NASH): Secondary | ICD-10-CM | POA: Diagnosis present

## 2017-11-05 DIAGNOSIS — N289 Disorder of kidney and ureter, unspecified: Secondary | ICD-10-CM | POA: Diagnosis not present

## 2017-11-05 DIAGNOSIS — Z96652 Presence of left artificial knee joint: Secondary | ICD-10-CM | POA: Diagnosis present

## 2017-11-05 DIAGNOSIS — Z803 Family history of malignant neoplasm of breast: Secondary | ICD-10-CM

## 2017-11-05 DIAGNOSIS — K5900 Constipation, unspecified: Secondary | ICD-10-CM | POA: Diagnosis present

## 2017-11-05 DIAGNOSIS — R079 Chest pain, unspecified: Secondary | ICD-10-CM | POA: Diagnosis not present

## 2017-11-05 DIAGNOSIS — I428 Other cardiomyopathies: Secondary | ICD-10-CM

## 2017-11-05 DIAGNOSIS — Z7189 Other specified counseling: Secondary | ICD-10-CM | POA: Diagnosis not present

## 2017-11-05 DIAGNOSIS — K625 Hemorrhage of anus and rectum: Secondary | ICD-10-CM

## 2017-11-05 DIAGNOSIS — A419 Sepsis, unspecified organism: Secondary | ICD-10-CM | POA: Diagnosis not present

## 2017-11-05 DIAGNOSIS — Z66 Do not resuscitate: Secondary | ICD-10-CM | POA: Diagnosis not present

## 2017-11-05 DIAGNOSIS — I5082 Biventricular heart failure: Secondary | ICD-10-CM | POA: Diagnosis not present

## 2017-11-05 DIAGNOSIS — D649 Anemia, unspecified: Secondary | ICD-10-CM | POA: Diagnosis not present

## 2017-11-05 DIAGNOSIS — K921 Melena: Secondary | ICD-10-CM

## 2017-11-05 DIAGNOSIS — I2510 Atherosclerotic heart disease of native coronary artery without angina pectoris: Secondary | ICD-10-CM | POA: Diagnosis present

## 2017-11-05 DIAGNOSIS — I5043 Acute on chronic combined systolic (congestive) and diastolic (congestive) heart failure: Secondary | ICD-10-CM | POA: Diagnosis not present

## 2017-11-05 DIAGNOSIS — R509 Fever, unspecified: Secondary | ICD-10-CM

## 2017-11-05 DIAGNOSIS — I5020 Unspecified systolic (congestive) heart failure: Secondary | ICD-10-CM

## 2017-11-05 DIAGNOSIS — K649 Unspecified hemorrhoids: Secondary | ICD-10-CM | POA: Diagnosis present

## 2017-11-05 DIAGNOSIS — R1110 Vomiting, unspecified: Secondary | ICD-10-CM

## 2017-11-05 DIAGNOSIS — Z86010 Personal history of colonic polyps: Secondary | ICD-10-CM

## 2017-11-05 LAB — I-STAT TROPONIN, ED: Troponin i, poc: 0.63 ng/mL (ref 0.00–0.08)

## 2017-11-05 LAB — HEMOGLOBIN A1C
HEMOGLOBIN A1C: 7.5 % — AB (ref 4.8–5.6)
MEAN PLASMA GLUCOSE: 168.55 mg/dL

## 2017-11-05 LAB — CBC
HCT: 42.1 % (ref 39.0–52.0)
Hemoglobin: 13.1 g/dL (ref 13.0–17.0)
MCH: 28.1 pg (ref 26.0–34.0)
MCHC: 31.1 g/dL (ref 30.0–36.0)
MCV: 90.1 fL (ref 78.0–100.0)
PLATELETS: 157 10*3/uL (ref 150–400)
RBC: 4.67 MIL/uL (ref 4.22–5.81)
RDW: 18.7 % — AB (ref 11.5–15.5)
WBC: 13.2 10*3/uL — AB (ref 4.0–10.5)

## 2017-11-05 LAB — URINALYSIS, ROUTINE W REFLEX MICROSCOPIC
Bilirubin Urine: NEGATIVE
Glucose, UA: NEGATIVE mg/dL
Hgb urine dipstick: NEGATIVE
KETONES UR: NEGATIVE mg/dL
LEUKOCYTES UA: NEGATIVE
NITRITE: NEGATIVE
PROTEIN: NEGATIVE mg/dL
Specific Gravity, Urine: 1.012 (ref 1.005–1.030)
pH: 5 (ref 5.0–8.0)

## 2017-11-05 LAB — PROTIME-INR
INR: 1.32
Prothrombin Time: 16.3 seconds — ABNORMAL HIGH (ref 11.4–15.2)

## 2017-11-05 LAB — BASIC METABOLIC PANEL
Anion gap: 12 (ref 5–15)
BUN: 38 mg/dL — ABNORMAL HIGH (ref 8–23)
CHLORIDE: 99 mmol/L (ref 98–111)
CO2: 28 mmol/L (ref 22–32)
CREATININE: 1.54 mg/dL — AB (ref 0.61–1.24)
Calcium: 8.9 mg/dL (ref 8.9–10.3)
GFR calc non Af Amer: 41 mL/min — ABNORMAL LOW (ref >60)
GFR, EST AFRICAN AMERICAN: 48 mL/min — AB (ref >60)
Glucose, Bld: 215 mg/dL — ABNORMAL HIGH (ref 70–99)
Potassium: 4.1 mmol/L (ref 3.5–5.1)
SODIUM: 139 mmol/L (ref 135–145)

## 2017-11-05 LAB — BRAIN NATRIURETIC PEPTIDE: B Natriuretic Peptide: 515.3 pg/mL — ABNORMAL HIGH (ref 0.0–100.0)

## 2017-11-05 LAB — GLUCOSE, CAPILLARY: Glucose-Capillary: 249 mg/dL — ABNORMAL HIGH (ref 70–99)

## 2017-11-05 LAB — MRSA PCR SCREENING: MRSA by PCR: NEGATIVE

## 2017-11-05 LAB — HEPARIN LEVEL (UNFRACTIONATED): HEPARIN UNFRACTIONATED: 0.82 [IU]/mL — AB (ref 0.30–0.70)

## 2017-11-05 LAB — TROPONIN I: TROPONIN I: 0.35 ng/mL — AB (ref <0.03)

## 2017-11-05 MED ORDER — POTASSIUM CHLORIDE CRYS ER 20 MEQ PO TBCR
20.0000 meq | EXTENDED_RELEASE_TABLET | Freq: Every day | ORAL | Status: DC
  Administered 2017-11-05 – 2017-11-08 (×4): 20 meq via ORAL
  Filled 2017-11-05 (×5): qty 1

## 2017-11-05 MED ORDER — AMIODARONE HCL IN DEXTROSE 360-4.14 MG/200ML-% IV SOLN
30.0000 mg/h | INTRAVENOUS | Status: DC
  Administered 2017-11-05 – 2017-11-10 (×10): 30 mg/h via INTRAVENOUS
  Filled 2017-11-05 (×12): qty 200

## 2017-11-05 MED ORDER — MAGNESIUM OXIDE 400 (241.3 MG) MG PO TABS
400.0000 mg | ORAL_TABLET | Freq: Every day | ORAL | Status: DC
  Administered 2017-11-06 – 2017-11-27 (×22): 400 mg via ORAL
  Filled 2017-11-05 (×22): qty 1

## 2017-11-05 MED ORDER — DRONABINOL 2.5 MG PO CAPS
2.5000 mg | ORAL_CAPSULE | Freq: Two times a day (BID) | ORAL | Status: DC
  Administered 2017-11-06 – 2017-11-27 (×42): 2.5 mg via ORAL
  Filled 2017-11-05 (×42): qty 1

## 2017-11-05 MED ORDER — SODIUM CHLORIDE 0.9% FLUSH
3.0000 mL | Freq: Two times a day (BID) | INTRAVENOUS | Status: DC
  Administered 2017-11-05 – 2017-11-13 (×6): 3 mL via INTRAVENOUS
  Administered 2017-11-14: 10 mL via INTRAVENOUS
  Administered 2017-11-15 – 2017-11-27 (×12): 3 mL via INTRAVENOUS

## 2017-11-05 MED ORDER — PANTOPRAZOLE SODIUM 40 MG PO TBEC
40.0000 mg | DELAYED_RELEASE_TABLET | Freq: Every day | ORAL | Status: DC
  Administered 2017-11-05 – 2017-11-27 (×23): 40 mg via ORAL
  Filled 2017-11-05 (×23): qty 1

## 2017-11-05 MED ORDER — AMIODARONE HCL IN DEXTROSE 360-4.14 MG/200ML-% IV SOLN
60.0000 mg/h | INTRAVENOUS | Status: AC
  Administered 2017-11-05: 60 mg/h via INTRAVENOUS
  Filled 2017-11-05: qty 200

## 2017-11-05 MED ORDER — INSULIN ASPART 100 UNIT/ML ~~LOC~~ SOLN
4.0000 [IU] | Freq: Three times a day (TID) | SUBCUTANEOUS | Status: DC
  Administered 2017-11-09 – 2017-11-12 (×9): 4 [IU] via SUBCUTANEOUS

## 2017-11-05 MED ORDER — INSULIN ASPART 100 UNIT/ML ~~LOC~~ SOLN
0.0000 [IU] | Freq: Three times a day (TID) | SUBCUTANEOUS | Status: DC
  Administered 2017-11-06 (×3): 3 [IU] via SUBCUTANEOUS
  Administered 2017-11-07: 5 [IU] via SUBCUTANEOUS
  Administered 2017-11-07 – 2017-11-08 (×3): 2 [IU] via SUBCUTANEOUS
  Administered 2017-11-08: 3 [IU] via SUBCUTANEOUS
  Administered 2017-11-08: 2 [IU] via SUBCUTANEOUS
  Administered 2017-11-09 (×2): 3 [IU] via SUBCUTANEOUS
  Administered 2017-11-10 (×2): 2 [IU] via SUBCUTANEOUS
  Administered 2017-11-11 (×2): 3 [IU] via SUBCUTANEOUS
  Administered 2017-11-11: 5 [IU] via SUBCUTANEOUS
  Administered 2017-11-12 – 2017-11-13 (×3): 3 [IU] via SUBCUTANEOUS
  Administered 2017-11-13: 2 [IU] via SUBCUTANEOUS
  Administered 2017-11-14: 8 [IU] via SUBCUTANEOUS
  Administered 2017-11-15 (×3): 2 [IU] via SUBCUTANEOUS
  Administered 2017-11-16: 3 [IU] via SUBCUTANEOUS
  Administered 2017-11-16: 2 [IU] via SUBCUTANEOUS
  Administered 2017-11-17: 3 [IU] via SUBCUTANEOUS
  Administered 2017-11-17 – 2017-11-18 (×2): 8 [IU] via SUBCUTANEOUS
  Administered 2017-11-18: 5 [IU] via SUBCUTANEOUS
  Administered 2017-11-19 (×2): 3 [IU] via SUBCUTANEOUS
  Administered 2017-11-19 – 2017-11-20 (×2): 2 [IU] via SUBCUTANEOUS
  Administered 2017-11-20: 3 [IU] via SUBCUTANEOUS
  Administered 2017-11-20: 2 [IU] via SUBCUTANEOUS
  Administered 2017-11-21: 3 [IU] via SUBCUTANEOUS
  Administered 2017-11-21: 2 [IU] via SUBCUTANEOUS
  Administered 2017-11-22: 3 [IU] via SUBCUTANEOUS
  Administered 2017-11-23: 5 [IU] via SUBCUTANEOUS
  Administered 2017-11-24: 3 [IU] via SUBCUTANEOUS
  Administered 2017-11-25 (×2): 2 [IU] via SUBCUTANEOUS
  Administered 2017-11-26 – 2017-11-27 (×3): 3 [IU] via SUBCUTANEOUS
  Administered 2017-11-27: 2 [IU] via SUBCUTANEOUS

## 2017-11-05 MED ORDER — ACETAMINOPHEN 325 MG PO TABS: 650.0000 mg | ORAL_TABLET | ORAL | Status: DC | PRN

## 2017-11-05 MED ORDER — ASPIRIN EC 81 MG PO TBEC
81.0000 mg | DELAYED_RELEASE_TABLET | Freq: Every day | ORAL | Status: DC
  Filled 2017-11-05: qty 1

## 2017-11-05 MED ORDER — MELATONIN 3 MG PO TABS
6.0000 mg | ORAL_TABLET | Freq: Every day | ORAL | Status: DC
  Administered 2017-11-05 – 2017-11-26 (×22): 6 mg via ORAL
  Filled 2017-11-05 (×23): qty 2

## 2017-11-05 MED ORDER — GABAPENTIN 400 MG PO CAPS
800.0000 mg | ORAL_CAPSULE | Freq: Every day | ORAL | Status: DC
  Administered 2017-11-05 – 2017-11-26 (×22): 800 mg via ORAL
  Filled 2017-11-05 (×22): qty 2

## 2017-11-05 MED ORDER — ASPIRIN 81 MG PO CHEW: 324.0000 mg | CHEWABLE_TABLET | Freq: Once | ORAL | Status: DC

## 2017-11-05 MED ORDER — SODIUM CHLORIDE 0.9% FLUSH: 3.0000 mL | INTRAVENOUS | Status: DC | PRN

## 2017-11-05 MED ORDER — FAMCICLOVIR 500 MG PO TABS
250.0000 mg | ORAL_TABLET | Freq: Every day | ORAL | Status: DC
  Administered 2017-11-06 – 2017-11-27 (×22): 250 mg via ORAL
  Filled 2017-11-05 (×22): qty 0.5

## 2017-11-05 MED ORDER — SODIUM CHLORIDE 0.9 % IV SOLN
250.0000 mL | INTRAVENOUS | Status: DC | PRN
  Administered 2017-11-11 – 2017-11-14 (×2): 7 mL via INTRAVENOUS
  Administered 2017-11-14: 10:00:00 via INTRAVENOUS
  Administered 2017-11-18 – 2017-11-19 (×2): 250 mL via INTRAVENOUS

## 2017-11-05 MED ORDER — HEPARIN BOLUS VIA INFUSION
4500.0000 [IU] | Freq: Once | INTRAVENOUS | Status: AC
Start: 2017-11-05 — End: 2017-11-05
  Administered 2017-11-05: 4500 [IU] via INTRAVENOUS
  Filled 2017-11-05: qty 4500

## 2017-11-05 MED ORDER — DIGOXIN 0.25 MG/ML IJ SOLN
0.2500 mg | Freq: Once | INTRAMUSCULAR | Status: AC
  Administered 2017-11-05: 0.25 mg via INTRAVENOUS
  Filled 2017-11-05: qty 2

## 2017-11-05 MED ORDER — ZOLPIDEM TARTRATE 5 MG PO TABS
5.0000 mg | ORAL_TABLET | Freq: Every evening | ORAL | Status: DC | PRN
  Administered 2017-11-05 – 2017-11-18 (×13): 5 mg via ORAL
  Filled 2017-11-05 (×13): qty 1

## 2017-11-05 MED ORDER — INSULIN ASPART 100 UNIT/ML ~~LOC~~ SOLN
0.0000 [IU] | Freq: Every day | SUBCUTANEOUS | Status: DC
  Administered 2017-11-10 – 2017-11-13 (×2): 3 [IU] via SUBCUTANEOUS
  Administered 2017-11-16: 2 [IU] via SUBCUTANEOUS

## 2017-11-05 MED ORDER — AMIODARONE LOAD VIA INFUSION
150.0000 mg | Freq: Once | INTRAVENOUS | Status: AC
  Administered 2017-11-05: 150 mg via INTRAVENOUS
  Filled 2017-11-05: qty 83.34

## 2017-11-05 MED ORDER — ROSUVASTATIN CALCIUM 10 MG PO TABS
10.0000 mg | ORAL_TABLET | Freq: Every day | ORAL | Status: DC
  Administered 2017-11-05 – 2017-11-27 (×23): 10 mg via ORAL
  Filled 2017-11-05 (×23): qty 1

## 2017-11-05 MED ORDER — LORAZEPAM 1 MG PO TABS
1.0000 mg | ORAL_TABLET | Freq: Once | ORAL | Status: DC
  Filled 2017-11-05: qty 1

## 2017-11-05 MED ORDER — FUROSEMIDE 10 MG/ML IJ SOLN
40.0000 mg | Freq: Two times a day (BID) | INTRAMUSCULAR | Status: DC
  Administered 2017-11-05 – 2017-11-06 (×2): 40 mg via INTRAVENOUS
  Filled 2017-11-05 (×3): qty 4

## 2017-11-05 MED ORDER — TRAZODONE HCL 50 MG PO TABS
50.0000 mg | ORAL_TABLET | Freq: Every day | ORAL | Status: DC
  Administered 2017-11-05 – 2017-11-26 (×22): 50 mg via ORAL
  Filled 2017-11-05 (×23): qty 1

## 2017-11-05 MED ORDER — HEPARIN (PORCINE) IN NACL 100-0.45 UNIT/ML-% IJ SOLN
1100.0000 [IU]/h | INTRAMUSCULAR | Status: AC
  Administered 2017-11-05 – 2017-11-06 (×3): 1200 [IU]/h via INTRAVENOUS
  Filled 2017-11-05 (×2): qty 250

## 2017-11-05 MED ORDER — SODIUM CHLORIDE 0.9 % IV BOLUS
250.0000 mL | Freq: Once | INTRAVENOUS | Status: AC
  Administered 2017-11-05: 250 mL via INTRAVENOUS

## 2017-11-05 MED ORDER — ONDANSETRON HCL 4 MG/2ML IJ SOLN
4.0000 mg | Freq: Four times a day (QID) | INTRAMUSCULAR | Status: DC | PRN
  Administered 2017-11-17 – 2017-11-25 (×4): 4 mg via INTRAVENOUS
  Filled 2017-11-05 (×4): qty 2

## 2017-11-05 NOTE — ED Triage Notes (Signed)
Pt reports mid chest tightness and sob x 2 days. Has generalized fatigue and states "just doesn't feel well."

## 2017-11-05 NOTE — Consult Note (Addendum)
Cardiology Consultation:   Patient ID: Dillon Andrews; [id number]; 1940/12/29   Admit date: 11/05/2017 Date of Consult: 11/05/2017  Primary Care Provider: [name], MD Primary Cardiologist: Dr. Ellyn Andrews Dr. Haroldine Andrews Primary Electrophysiologist:  None   Patient Profile:   Dillon Andrews is a 78 y.o. male with a PMH of chronic combined CHF (last Echo with EF 25-30% 06/2017), PYP scan suggestive of transthyretin amyloidosis, HTN, HLD, DM type 2, NASH with esophageal varices, CKD stage III, and recent diagnosis of kappa light chain myeloma who started chemotherapy 10/30/17, who is being seen today for the evaluation of atrial fibrillation at the request of Dr. Tomi Andrews.  History of Present Illness:   Dillon Andrews has been feeling generally unwell for the past week. He was recently started on chemotherapy outpatient (received an injection 1 week ago and started an oral medication yesterday). He reports last week he checked his vitals at home as he routinely does and noted his HR to be 130. He continued to monitor this periodically over the next week with continued intermittently elevated HR's ranging from 80s-130s. He reports that he did not feel his heart racing or palpitations. He states for the past 2 weeks he has noticed worsening SOB with associated weight gain of about 1lb per day for the past 4 days. He has also reported intermittent chest discomfort for the past week which does not significantly worsen with activity, however does seem to improve with rest.   He was last seen by Dr. Haroldine Andrews 10/02/17 for routine follow up and was thought to be doing well from a cardiac standpoint. The plan was to begin paperwork to initiate Tafamdis for wild-type amyloidosis management, however the patient reports a copay of $1000 which he is unable to afford so this medication has not been started yet. He was recommended to continue torsemide 70m BID, coreg 6.227mBID, and spironolactone 12.81m61mdaily. His last echo was 06/2017 with EF 25-30%. He underwent a LHC 09/2016 which revealed mild non-obstructive CAD with 30% mid Cx stenosis, otherwise clean coronaries.   At the time of this evaluation he is resting comfortably in bed without CP complaints. He continues to feel mildly SOB. He denies palpitations or feeling his heart race. He reports orthopnea but no PND or LE edema. He denies complications of his esophageal varices. He denies melena, hematochezia, or hematuria.   Hospital course: Hypotensive, tachycardic to the 120s, tachypneic, afebrile, and satting well on RA. Labs notable for electrolytes wnl, Cr 1.54 (baseline), WBC 13.2, Hgb 13.1, PLT 157, Trop 0.63, BNP 515. CXR with small bilateral effusion, no pulmonary edema. EKG with atrial fibrillation, rate 112, RBBB. He was given an IVF bolus and started on a heparin gtt in the ED. Cardiology asked to evaluate for new onset atrial fibrillation.   Past Medical History:  Diagnosis Date  . Anemia 11/2016  . Arthritis    "some; hands/fingers" (06/25/2017)  . Atrial tachycardia ([facility] . Barrett's esophagus   . Basal cell carcinoma    "face, arms, hands" (06/25/2017)  . Chicken pox   . Cholelithiasis 11/2016   S/P LAP CHOLE  . Chronic combined systolic and diastolic CHF (congestive heart failure) ([facility] . CKD (chronic kidney disease), stage III ([facility] . Esophageal varices (HCC)    Secondary to NAFLD  . Family history of adverse reaction to anesthesia    "daughter's sat drop very low; last OR had to be reintubated" (06/25/2017)  . GERD (gastroesophageal  reflux disease)   . Goals of care, counseling/discussion 10/22/2017  . High cholesterol   . Hypertension   . Kappa light chain myeloma ([place]) 10/22/2017  . Mitral regurgitation   . NAFLD (nonalcoholic fatty liver disease)   . NICM (nonischemic cardiomyopathy) ([place])    Echo 06/2017: Severe LVH.  EF 25 to 30% with mild HK of the baseline after septal wall.  Moderate to severely dilated  right atrium, moderately dilated RV. (Concern fr ? amyloidosis).  . NSVT (nonsustained ventricular tachycardia) ([place])   . Pneumonia 09/2016  . RBBB   . Tricuspid regurgitation   . Type II diabetes mellitus ([place])     Past Surgical History:  Procedure Laterality Date  . BACK SURGERY    . BASAL CELL CARCINOMA EXCISION Right    temple; "had to do skin graft"  . CARDIAC CATHETERIZATION  09/2016   Minimal, non-occlusive CAD, EF 45% [facility])  . CARPAL TUNNEL RELEASE Bilateral   . CATARACT EXTRACTION W/ INTRAOCULAR LENS  IMPLANT, BILATERAL Bilateral   . CHOLECYSTECTOMY N/A 11/22/2016   Procedure: LAPAROSCOPIC CHOLECYSTECTOMY;  Surgeon: Dillon Keens, MD;  Location: [place];  Service: General;  Laterality: N/A;  . JOINT REPLACEMENT    . LUMBAR DISC SURGERY  X 2  . TONSILLECTOMY AND ADENOIDECTOMY    . TOTAL KNEE ARTHROPLASTY Left 2010  . TRANSESOPHAGEAL ECHOCARDIOGRAM  09/2016   Mild global LV hypokinesis. Moderate MR. Moderate TR. [facility])  . TRANSTHORACIC ECHOCARDIOGRAM  10/2016   [facility]) EF 35-40% with moderate HK. Normal RV function. Moderately severe TR.  Marland Kitchen TRANSTHORACIC ECHOCARDIOGRAM  03/2017; 06/2017    A) ([place]) mild concentric LVH. EF of 40-45%. Diffuse HK with no RWMA. GR 2 DD. Septal dyssynergy. Mild MR, moderate TR. Severe RA dilation..;; B) Severe LVH.  EF 25 to 30% with mild HK of Septal wall.  Mod-severely dilated RA & Mod dilated RV.     Home Medications:  Prior to Admission medications   Medication Sig Start Date End Date Taking? Authorizing Provider  aspirin EC 81 MG tablet Take 81 mg by mouth daily. 12/23/14   [provider]  carvedilol (COREG) 6.25 MG tablet TAKE 1 TABLET BY MOUTH TWICE A DAY WITH MEALS 10/28/17   Dillon Man, MD  dexamethasone (DECADRON) 4 MG tablet Take 5 tablets (20 mg total) by mouth once a week. 10/30/17   Dillon Napoleon, MD  dronabinol (MARINOL) 2.5 MG capsule Take 1 capsule (2.5 mg  total) by mouth 2 (two) times daily before lunch and supper. 10/22/17   Dillon Napoleon, MD  famciclovir (FAMVIR) 250 MG tablet Take 1 tablet (250 mg total) by mouth daily. 10/22/17   Dillon Napoleon, MD  gabapentin (NEURONTIN) 800 MG tablet TAKE 1 TABLET BY MOUTH AT BEDTIME 09/23/17   Dillon Maw, MD  glucose blood test strip Use as instructed 03/05/17   Dillon Maw, MD  lenalidomide (REVLIMID) 20 MG capsule Take 1 capsule (20 mg total) by mouth daily. Take 1 capsule at bedtime for 21 days on and 7 days off. Swallow whole.  Do not Crush. 10/22/17   Dillon Napoleon, MD  magnesium oxide (MAG-OX) 400 MG tablet Take 400 mg by mouth daily.    [provider]  Melatonin 5 MG TABS Take by mouth at bedtime.    [provider]  omeprazole (PRILOSEC) 40 MG capsule Take 1 capsule (40 mg total) by mouth daily. 07/31/17   Abelino Derrick  Delrae Alfred, MD  ondansetron (ZOFRAN) 4 MG tablet Take 1 tablet (4 mg total) by mouth every 8 (eight) hours as needed for nausea or vomiting. Patient not taking: Reported on 10/22/2017 10/08/17   Dillon Maw, MD  potassium chloride SA (K-DUR,KLOR-CON) 20 MEQ tablet TAKE 1 TO 2 TABLET DAILY AS DIRECTED 08/11/17   Dillon Man, MD  Probiotic Product (PROBIOTIC DAILY PO) Take 1 tablet by mouth.    [provider]  rosuvastatin (CRESTOR) 10 MG tablet TAKE 1 TABLET BY MOUTH ONCE DAILY 10/29/17   Dillon Maw, MD  torsemide (DEMADEX) 20 MG tablet Take 20 mg by mouth 2 (two) times daily.     [provider]  traZODone (DESYREL) 50 MG tablet TAKE 1 TABLET(50 MG) BY MOUTH AT BEDTIME 10/29/17   Dillon Maw, MD  vitamin B-12 (CYANOCOBALAMIN) 500 MCG tablet Take 500 mcg by mouth every other day.    [provider]  zolpidem (AMBIEN) 5 MG tablet TAKE 1 TABLET BY MOUTH EVERY NIGHT AT BEDTIME AS NEEDED FOR SLEEP 10/23/17   Dillon Maw, MD    Inpatient Medications: Scheduled Meds: .  aspirin  324 mg Oral Once  . heparin  4,500 Units Intravenous Once   Continuous Infusions: . heparin    . sodium chloride 250 mL (11/05/17 1331)   PRN Meds:   Allergies:   No Known Allergies  Social History:   Social History   Socioeconomic History  . Marital status: Married    Spouse name: Not on file  . Number of children: 4  . Years of education: 69  . Highest education level: Not on file  Occupational History  . Occupation: retired  Scientific laboratory technician  . Financial resource strain: Not on file  . Food insecurity:    Worry: Not on file    Inability: Not on file  . Transportation needs:    Medical: Not on file    Non-medical: Not on file  Tobacco Use  . Smoking status: Former Smoker    Packs/day: 0.30    Years: 15.00    Pack years: 4.50    Types: Cigarettes    Last attempt to quit: 03/17/1977    Years since quitting: 40.6  . Smokeless tobacco: Never Used  Substance and Sexual Activity  . Alcohol use: Yes    Comment: 06/25/2017 "nothing since 09/2016;  used to have glass of wine q hs"  . Drug use: No  . Sexual activity: Not on file  Lifestyle  . Physical activity:    Days per week: Not on file    Minutes per session: Not on file  . Stress: Not on file  Relationships  . Social connections:    Talks on phone: Not on file    Gets together: Not on file    Attends religious service: Not on file    Active member of club or organization: Not on file    Attends meetings of clubs or organizations: Not on file    Relationship status: Not on file  . Intimate partner violence:    Fear of current or ex partner: Not on file    Emotionally abused: Not on file    Physically abused: Not on file    Forced sexual activity: Not on file  Other Topics Concern  . Not on file  Social History Narrative   He is retired from [employer].  Previously worked for [employer] by his daughter --> also a  patient of Dr. Ellyn Andrews   Lives with wife in a one story home.     Education: high school.     Family History:    Family History  Problem Relation Age of Onset  . Heart failure Mother   . Cancer Father   . Cataracts Father   . Breast cancer Sister   . Alcohol abuse Brother   . Liver disease Brother   . Autoimmune disease Daughter        Bechet's syndrome  . Cirrhosis Neg Hx      ROS:  Please see the history of present illness.   All other ROS reviewed and negative.     Physical Exam/Data:   Vitals:   11/05/17 1300 11/05/17 1315 11/05/17 1330 11/05/17 1400  BP: (!) 86/77 (!) 89/61 (!) 91/59   Pulse: (!) 117 (!) 44 (!) 118   Resp: (!) 22 (!) 25 (!) 27   Temp:      TempSrc:      SpO2: 95% 95% 94%   Weight:    185 lb 6.5 oz (84.1 kg)  Height:    5' 9"  (1.753 m)   No intake or output data in the 24 hours ending 11/05/17 1415 Filed Weights   11/05/17 1153 11/05/17 1400  Weight: 185 lb 6.4 oz (84.1 kg) 185 lb 6.5 oz (84.1 kg)   Body mass index is 27.38 kg/m.  General:  Well nourished, well developed, laying in bed in no acute distress HEENT: sclera anicteric  Neck: +JVD Vascular: No carotid bruits; distal pulses 2+ bilaterally Cardiac:  normal S1, S2; IRIR; no murmur  Lungs:  clear to auscultation bilaterally, no wheezing, rhonchi or rales  Abd: NABS, soft, mild suprapubic tenderness, no hepatomegaly Ext: no edema Musculoskeletal:  No deformities, BUE and BLE strength normal and equal Skin: warm and dry  Neuro:  CNs 2-12 intact, no focal abnormalities noted Psych:  Normal affect   EKG:  The EKG was personally reviewed and demonstrates:  atrial fibrillation, rate 112, RBBB Telemetry:  Telemetry was personally reviewed and demonstrates:  Wide complex tachycardia with irregular rhythm; no evidence of LBBB on prior EKGs.   Relevant CV Studies: Echocardiogram 06/2017: Study Conclusions  - Left ventricle: The cavity size was normal. Wall thickness was   increased in a pattern of severe LVH. Systolic function was   severely reduced.  The estimated ejection fraction was in the   range of 25% to 30%. Mild hypokinesis of the basalanteroseptal   myocardium. - Aortic valve: Mildly calcified annulus. Trileaflet; normal   thickness, mildly calcified leaflets. - Left atrium: The atrium was mildly dilated. - Right ventricle: The cavity size was mildly to moderately   dilated. Wall thickness was normal. - Right atrium: The atrium was moderately to severely dilated.  Cardiac catheterization 09/2016: Diagnostic Procedure Summary 1. Mild Non-Obstructive CAD - 30% mid Cx stenosis, otherwise clean coronaries 2. Mild global LV dysfunction, LVEF 40-45% 3. Right heart cath showed unremarkable right heart pressure except Peak " V" wave noted, and Wedge of 19  Laboratory Data:  Chemistry Recent Labs  Lab 10/30/17 1257 11/05/17 1212  NA 142 139  K 4.7 4.1  CL 103 99  CO2 34* 28  GLUCOSE 173* 215*  BUN 28* 38*  CREATININE 1.30* 1.54*  CALCIUM 9.2 8.9  GFRNONAA  --  41*  GFRAA  --  48*  ANIONGAP 5 12    Recent Labs  Lab 10/30/17 1257  PROT 6.7  ALBUMIN 3.3*  AST 39*  ALT 34  ALKPHOS 170*  BILITOT 1.4   Hematology Recent Labs  Lab 10/30/17 1257 11/05/17 1212  WBC 8.8 13.2*  RBC 4.35 4.67  HGB 12.3* 13.1  HCT 39.3 42.1  MCV 90.3 90.1  MCH 28.3 28.1  MCHC 31.3* 31.1  RDW 18.6* 18.7*  PLT 152 157   Cardiac EnzymesNo results for input(s): TROPONINI in the last 168 hours.  Recent Labs  Lab 11/05/17 1211  TROPIPOC 0.63*    BNP Recent Labs  Lab 11/05/17 1310  BNP 515.3*    DDimer No results for input(s): DDIMER in the last 168 hours.  Radiology/Studies:  Dg Chest 2 View  Result Date: 11/05/2017 CLINICAL DATA:  Mid chest tightness and shortness of breath for 2 days. EXAM: CHEST - 2 VIEW COMPARISON:  Single-view of the chest 09/15/2017. PA and lateral chest 07/10/2017. CT chest 03/20/2017. FINDINGS: There is cardiomegaly without edema. Small bilateral pleural effusions are seen on all the comparison  examinations and have slightly increased since the most recent study. There is associated basilar atelectasis. Aortic atherosclerosis is noted. No acute or focal bony abnormality. IMPRESSION: Chronic small bilateral pleural effusions have increased somewhat since the most recent examination. Cardiomegaly without edema. Atherosclerosis. Electronically Signed   By: Inge Rise M.D.   On: 11/05/2017 12:35    Assessment and Plan:   1. New onset atrial fibrillation: patient presented with SOB and chest tightness. Found to be in atrial fibrillation with rate in the 130s. BP's have been on the soft side limiting rate controlling options. He was started on a heparin gtt in the ED - This patients CHA2DS2-VASc Score and unadjusted Ischemic Stroke Rate (% per year) is equal to 7.2 % stroke rate/year from a score of 5 Above score calculated as 1 point each if present [CHF, HTN, DM, Vascular=MI/PAD/Aortic Plaque, Age if 65-74, or Male] Above score calculated as 2 points each if present [Age > 75, or Stroke/TIA/TE] - Continue heparin gtt for now - anticipate transition to apixaban 51m BID prior to discharge. Will need close monitoring for bleeding given known esophageal varices. At this time benefits of anticoagulation outweigh risks.  - Would benefit from DCCV, however given esophageal varices would be hesitant to pursue TEE/DCCV this admission. Favor anticoagulation for 3-4 weeks followed by DCCV if remains in atrial fibrillation  2. Acute on chronic systolic CHF: [facility] which revealed mild non-obstructive CAD. Last echo with EF 25-30% 06/2017 who follows outpatient with Dr. BHaroldine Andrews Patient presented with SOB and chest tightness, and found to be in atrial fibrillation with RVR. Likely new onset afib is driving acute CHF. BNP is elevated to 515. CXR without overt edema. He has an elevated JVD on exam and reported weight gain of 1lb per day for the past 4  days; lungs are clear on exam and no LE edema noted.  - Hold home coreg given hypotension - Will transition home torsemide to IV lasix 424mBID - Monitor strict I&Os and daily weights  3. Kappa lightchain myeloma: follows with Dr. EnMarin OlpStarted on Bortezomib 10/30/16 and Revlimid 11/04/17. - Continue management per primary team   4. HLD: - Continue home statin  5. NASH with esophageal varices: no history of complications from varices. Will need close monitoring with initiation of anticoagulation - Continue routine outpatient monitoring  6. CKD stage 3: Cr 1.54 today, appears to be baseline.  - Continue to monitor closely with IV diuresis  7. DM  type 2: las A1C 6.4 09/2017; at goal of <70 - Continue management per primary team   8. Leukocytosis: WBC 13 on admission. Did receive 1x dose of steroids on Saturday which may have contributed. No complaints of fever or URI symptoms. CXR without infection. He did report some difficulty urinating this AM and had some suprapubic TTP on exam.  - Check UA/UCx - Continue to monitor   For questions or updates, please contact [place] Please consult [email or web address] for contact info under Cardiology/STEMI.   Signed, Abigail Butts, PA-C  11/05/2017 2:15 PM [phone]   Agree with assessment and plan by Roby Lofts PA-C  We are asked to see Mr. Peel for new onset A. fib and relative hypotension in the setting of nonischemic cardiomyopathy.  He is a patient of Dr. Clayborne Dana.  He did have a cardiac catheterization last year which showed essentially normal coronary arteries.  His last echo revealed EF in the 25 to 30% range.  There is a question of amyloid cardiomyopathy.  He was recently diagnosed with multiple myeloma and was started on chemotherapy in addition to steroids.  His other problems include an ASH with esophageal varices which have never bled as well as essential hypertension, hyperlipidemia and type 2 diabetes.  He checks  his blood pressure at home and is noticed that his resting heart rate has been elevated in the low 100 range where it usually runs in the 60-70 range and his blood pressures have been mildly lower as well.  He is gained a pound a day for last 4 days and has noticed increased shortness of breath and orthopnea.  In the ER today he is in A. fib with RVR.  His systolic blood pressure is 95.  His lungs are clear.  He has no peripheral edema although his jugular venous pressure is elevated.  He is currently on IV heparin.  I believe he should be started on oral anticoagulant despite his esophageal varices since they have not bled.  We will change his torsemide to IV Lasix.  I have discussed the case with Dr. Haroldine Andrews who is agreed to accept him on his consultative service tomorrow.  The issue will be whether to perform TEE guided DC cardioversion versus wait 4 weeks on oral anticoagulations prior to outpatient cardioversion.  Lorretta Harp, M.D., [place], [facility], [place] [address]. [place], [place]  [address]  [phone] 11/05/2017 4:26 PM

## 2017-11-05 NOTE — Progress Notes (Signed)
ANTICOAGULATION CONSULT NOTE   Pharmacy Consult for heparin Indication: atrial fibrillation  No Known Allergies  Patient Measurements: Height: 5' 9"  (175.3 cm) Weight: 185 lb 6.5 oz (84.1 kg) IBW/kg (Calculated) : 70.7 Heparin Dosing Weight: 84.1kg  Vital Signs: Temp: 98.2 F (36.8 C) (07/24 1156) Temp Source: Oral (07/24 1156) BP: 97/79 (07/24 2115) Pulse Rate: 97 (07/24 2200)  Labs: Recent Labs    11/05/17 1212 11/05/17 1310 11/05/17 2103 11/05/17 2159  HGB 13.1  --   --   --   HCT 42.1  --   --   --   PLT 157  --   --   --   LABPROT  --  16.3*  --   --   INR  --  1.32  --   --   HEPARINUNFRC  --   --   --  0.82*  CREATININE 1.54*  --   --   --   TROPONINI  --   --  0.35*  --     Estimated Creatinine Clearance: 39.5 mL/min (A) (by C-G formula based on SCr of 1.54 mg/dL (H)).   Medical History: Past Medical History:  Diagnosis Date  . Anemia 11/2016  . Arthritis    "some; hands/fingers" (06/25/2017)  . Atrial tachycardia ([place])   . Barrett's esophagus   . Basal cell carcinoma    "face, arms, hands" (06/25/2017)  . Chicken pox   . Cholelithiasis 11/2016   S/P LAP CHOLE  . Chronic combined systolic and diastolic CHF (congestive heart failure) ([place])   . CKD (chronic kidney disease), stage III ([place])   . Esophageal varices (HCC)    Secondary to NAFLD  . Family history of adverse reaction to anesthesia    "daughter's sat drop very low; last OR had to be reintubated" (06/25/2017)  . GERD (gastroesophageal reflux disease)   . Goals of care, counseling/discussion 10/22/2017  . High cholesterol   . Hypertension   . Kappa light chain myeloma ([place]) 10/22/2017  . Mitral regurgitation   . NAFLD (nonalcoholic fatty liver disease)   . NICM (nonischemic cardiomyopathy) ([place])    Echo 06/2017: Severe LVH.  EF 25 to 30% with mild HK of the baseline after septal wall.  Moderate to severely dilated right atrium, moderately dilated RV. (Concern fr ? amyloidosis).  . NSVT  (nonsustained ventricular tachycardia) ([place])   . Pneumonia 09/2016  . RBBB   . Tricuspid regurgitation   . Type II diabetes mellitus (HCC)     Medications:  Infusions:  . sodium chloride    . amiodarone 30 mg/hr (11/05/17 2227)  . heparin 1,200 Units/hr (11/05/17 1435)    Assessment: 69 yom presented to the ED with CP an SOB. Found to be in new onset afib. Starting IV heparin for anticoagulation. Baseline CBC is WNL and he is not on anticoagulation PTA.  Initial heparin level is elevated 0.82  Units/ml  Goal of Therapy:  Heparin level 0.3-0.7 units/ml Monitor platelets by anticoagulation protocol: Yes   Plan:  Decrease heparin gtt 1100 units/hr Check an 8 hr heparin level Daily heparin level and CBC  Dillon Andrews 11/05/2017,11:16 PM

## 2017-11-05 NOTE — ED Notes (Signed)
Phleb sent down blood to run trop I blood test.

## 2017-11-05 NOTE — ED Notes (Signed)
Dr. Tomi Bamberger at bedside for evaluation

## 2017-11-05 NOTE — ED Notes (Signed)
Pt returned to RM 45 from Xray at this time

## 2017-11-05 NOTE — H&P (Signed)
History and Physical    Dillon Andrews DOB: 1940-11-25 DOA: 11/05/2017  PCP: [name], MD  Patient coming from: home  I have personally briefly reviewed patient's old medical records in [place]  Chief Complaint: SOB  HPI: Dillon Andrews is a 77 y.o. male with medical history significant of liver cirrhosis with esophageal varices, bilateral small pleural effusions, combined systemic and diastolic CHF with EF 32-44%, nonischemic CM, pulmonary HTN, RBBB, cardiac amyloidosis, CKD stage 3 due to DM-2, kappa light chain myeloma on chemotherapy (last dose last week), was brought to ER due to SOB and more lethargy found by his daughters. Pt stated he's been feeling more SOB in last 2 weeks, and gained about 4 lbs in last 4 days. At night he could lay down for sleep and could sleep better in his recliner. No leg swelling though. Starting yesterday, his daughter found him more lethargic than usual and barely ate any meals. Today he's not improving therefore they decided to bring him to ER. Pt did feel some chest pressure along with SOB and last week he also had some palpitations but did not check if it is regular.   ED Course: in ER, pt was found to be in new onset Afib with RVR. However, BP 81/68-101/84, therefore not able to start cardizem drip. BNP 515.3 which was 393 last month. Troponin 0.63 which was 0.17 at the end of last year. CXR showed "Chronic small bilateral pleural effusions have increased somewhat since the most recent examination. Cardiomegaly without edema." EKG showed Atrial fibrillation with rapid ventricular response; Left axis deviation; Right bundle branch block; Inferior infarct , age undetermined; Anterior infarct , age undetermined; T wave abnormality, consider lateral ischemia; Abnormal ECG. Cardiology was already consulted by ER and started pt on amiodarone drip and recommended iv lasix for diuresis but hold other CHF medications due to  persistent hypotension. TRH consulted to further manage afib RVR and CHF exacerbation.   Review of Systems: As per HPI otherwise 10 point review of systems negative.     Past Medical History:  Diagnosis Date  . Anemia 11/2016  . Arthritis    "some; hands/fingers" (06/25/2017)  . Atrial tachycardia ([place])   . Barrett's esophagus   . Basal cell carcinoma    "face, arms, hands" (06/25/2017)  . Chicken pox   . Cholelithiasis 11/2016   S/P LAP CHOLE  . Chronic combined systolic and diastolic CHF (congestive heart failure) ([place])   . CKD (chronic kidney disease), stage III ([place])   . Esophageal varices (HCC)    Secondary to NAFLD  . Family history of adverse reaction to anesthesia    "daughter's sat drop very low; last OR had to be reintubated" (06/25/2017)  . GERD (gastroesophageal reflux disease)   . Goals of care, counseling/discussion 10/22/2017  . High cholesterol   . Hypertension   . Kappa light chain myeloma ([place]) 10/22/2017  . Mitral regurgitation   . NAFLD (nonalcoholic fatty liver disease)   . NICM (nonischemic cardiomyopathy) ([place])    Echo 06/2017: Severe LVH.  EF 25 to 30% with mild HK of the baseline after septal wall.  Moderate to severely dilated right atrium, moderately dilated RV. (Concern fr ? amyloidosis).  . NSVT (nonsustained ventricular tachycardia) ([place])   . Pneumonia 09/2016  . RBBB   . Tricuspid regurgitation   . Type II diabetes mellitus ([place])     Past Surgical History:  Procedure Laterality Date  . BACK SURGERY    .  BASAL CELL CARCINOMA EXCISION Right    temple; "had to do skin graft"  . CARDIAC CATHETERIZATION  09/2016   Minimal, non-occlusive CAD, EF 45% [facility])  . CARPAL TUNNEL RELEASE Bilateral   . CATARACT EXTRACTION W/ INTRAOCULAR LENS  IMPLANT, BILATERAL Bilateral   . CHOLECYSTECTOMY N/A 11/22/2016   Procedure: LAPAROSCOPIC CHOLECYSTECTOMY;  Surgeon: Coralie Keens, MD;  Location: [facility];  Service: General;  Laterality: N/A;  . JOINT  REPLACEMENT    . LUMBAR DISC SURGERY  X 2  . TONSILLECTOMY AND ADENOIDECTOMY    . TOTAL KNEE ARTHROPLASTY Left 2010  . TRANSESOPHAGEAL ECHOCARDIOGRAM  09/2016   Mild global LV hypokinesis. Moderate MR. Moderate TR. [facility])  . TRANSTHORACIC ECHOCARDIOGRAM  10/2016   [facility]) EF 35-40% with moderate HK. Normal RV function. Moderately severe TR.  Marland Kitchen TRANSTHORACIC ECHOCARDIOGRAM  03/2017; 06/2017    A) ([place]) mild concentric LVH. EF of 40-45%. Diffuse HK with no RWMA. GR 2 DD. Septal dyssynergy. Mild MR, moderate TR. Severe RA dilation..;; B) Severe LVH.  EF 25 to 30% with mild HK of Septal wall.  Mod-severely dilated RA & Mod dilated RV.     reports that he quit smoking about 40 years ago. His smoking use included cigarettes. He has a 4.50 pack-year smoking history. He has never used smokeless tobacco. He reports that he drinks alcohol. He reports that he does not use drugs.  No Known Allergies  Family History  Problem Relation Age of Onset  . Heart failure Mother   . Cancer Father   . Cataracts Father   . Breast cancer Sister   . Alcohol abuse Brother   . Liver disease Brother   . Autoimmune disease Daughter        Bechet's syndrome  . Cirrhosis Neg Hx      Prior to Admission medications   Medication Sig Start Date End Date Taking? Authorizing Provider  aspirin EC 81 MG tablet Take 81 mg by mouth daily. 12/23/14  Yes [provider]  carvedilol (COREG) 6.25 MG tablet TAKE 1 TABLET BY MOUTH TWICE A DAY WITH MEALS 10/28/17  Yes Leonie Man, MD  dexamethasone (DECADRON) 4 MG tablet Take 5 tablets (20 mg total) by mouth once a week. 10/30/17  Yes Volanda Napoleon, MD  dronabinol (MARINOL) 2.5 MG capsule Take 1 capsule (2.5 mg total) by mouth 2 (two) times daily before lunch and supper. 10/22/17  Yes Volanda Napoleon, MD  famciclovir (FAMVIR) 250 MG tablet Take 1 tablet (250 mg total) by mouth daily. 10/22/17  Yes Volanda Napoleon, MD  gabapentin  (NEURONTIN) 800 MG tablet TAKE 1 TABLET BY MOUTH AT BEDTIME 09/23/17  Yes [name], MD  lenalidomide (REVLIMID) 20 MG capsule Take 1 capsule (20 mg total) by mouth daily. Take 1 capsule at bedtime for 21 days on and 7 days off. Swallow whole.  Do not Crush. 10/22/17  Yes Volanda Napoleon, MD  magnesium oxide (MAG-OX) 400 MG tablet Take 400 mg by mouth daily.   Yes [provider]  Melatonin 5 MG TABS Take 5 mg by mouth at bedtime.    Yes [provider]  omeprazole (PRILOSEC) 40 MG capsule Take 1 capsule (40 mg total) by mouth daily. 07/31/17  Yes [name], MD  ondansetron (ZOFRAN) 4 MG tablet Take 1 tablet (4 mg total) by mouth every 8 (eight) hours as needed for nausea or vomiting. 10/08/17  Yes Abelino Derrick  Delrae Alfred, MD  potassium chloride SA (K-DUR,KLOR-CON) 20 MEQ tablet TAKE 1 TO 2 TABLET DAILY AS DIRECTED 08/11/17  Yes Leonie Man, MD  Probiotic Product (PROBIOTIC DAILY PO) Take 1 tablet by mouth.   Yes [provider]  rosuvastatin (CRESTOR) 10 MG tablet TAKE 1 TABLET BY MOUTH ONCE DAILY 10/29/17  Yes [name], MD  torsemide (DEMADEX) 20 MG tablet Take 20 mg by mouth 2 (two) times daily.    Yes [provider]  traZODone (DESYREL) 50 MG tablet TAKE 1 TABLET(50 MG) BY MOUTH AT BEDTIME 10/29/17  Yes [name], MD  vitamin B-12 (CYANOCOBALAMIN) 500 MCG tablet Take 500 mcg by mouth every other day.   Yes [provider]  zolpidem (AMBIEN) 5 MG tablet TAKE 1 TABLET BY MOUTH EVERY NIGHT AT BEDTIME AS NEEDED FOR SLEEP 10/23/17  Yes [name], MD  glucose blood test strip Use as instructed 03/05/17   [name], MD  VYNDAQEL 20 MG CAPS Take 20 mg by mouth daily. 11/03/17   [provider]    Physical Exam: Vitals:   11/05/17 1700 11/05/17 1715 11/05/17 1730 11/05/17 1800  BP: 92/77 90/71 93/77  91/76  Pulse: (!) 119 (!) 121 62 75  Resp: (!) 26 (!) 24 (!) 27 18    Temp:      TempSrc:      SpO2: 94% 95% 95% 91%  Weight:      Height:        Constitutional: NAD, calm, comfortable Vitals:   11/05/17 1700 11/05/17 1715 11/05/17 1730 11/05/17 1800  BP: 92/77 90/71 93/77  91/76  Pulse: (!) 119 (!) 121 62 75  Resp: (!) 26 (!) 24 (!) 27 18  Temp:      TempSrc:      SpO2: 94% 95% 95% 91%  Weight:      Height:       Eyes: PERRL, lids and conjunctivae normal ENMT: Mucous membranes are moist. Posterior pharynx clear of any exudate or lesions.Normal dentition.  Neck: +JVD, supple, no masses, no thyromegaly Respiratory: clear to auscultation bilaterally,positive rales. Normal respiratory effort. No accessory muscle use.  Cardiovascular: irregularly irregular heart rate and rhythm, no murmurs / rubs / gallops. No extremity edema. 2+ pedal pulses. No carotid bruits.  Abdomen: no tenderness, no masses palpated. No hepatosplenomegaly. Bowel sounds positive.  Musculoskeletal: no clubbing / cyanosis. No joint deformity upper and lower extremities. Good ROM, no contractures. Normal muscle tone.  Skin: no rashes, lesions, ulcers. No induration Neurologic: CN 2-12 grossly intact. Sensation intact, DTR normal. Strength 5/5 in all 4.  Psychiatric: Normal judgment and insight. Alert and oriented x 3. Normal mood.    Labs on Admission: I have personally reviewed following labs and imaging studies  CBC: Recent Labs  Lab 10/30/17 1257 11/05/17 1212  WBC 8.8 13.2*  NEUTROABS 6.2  --   HGB 12.3* 13.1  HCT 39.3 42.1  MCV 90.3 90.1  PLT 152 275   Basic Metabolic Panel: Recent Labs  Lab 10/30/17 1257 11/05/17 1212  NA 142 139  K 4.7 4.1  CL 103 99  CO2 34* 28  GLUCOSE 173* 215*  BUN 28* 38*  CREATININE 1.30* 1.54*  CALCIUM 9.2 8.9   GFR: Estimated Creatinine Clearance: 39.5 mL/min (A) (by C-G formula based on SCr of 1.54 mg/dL (H)). Liver Function Tests: Recent Labs  Lab 10/30/17 1257  AST 39*  ALT 34  ALKPHOS 170*  BILITOT 1.4  PROT 6.7  ALBUMIN 3.3*   No results for input(s): LIPASE, AMYLASE in the last 168 hours. No results for input(s): AMMONIA in the last 168 hours. Coagulation Profile: Recent Labs  Lab 11/05/17 1310  INR 1.32   Cardiac Enzymes: No results for input(s): CKTOTAL, CKMB, CKMBINDEX, TROPONINI in the last 168 hours. BNP (last 3 results) No results for input(s): PROBNP in the last 8760 hours. HbA1C: No results for input(s): HGBA1C in the last 72 hours. CBG: No results for input(s): GLUCAP in the last 168 hours. Lipid Profile: No results for input(s): CHOL, HDL, LDLCALC, TRIG, CHOLHDL, LDLDIRECT in the last 72 hours. Thyroid Function Tests: No results for input(s): TSH, T4TOTAL, FREET4, T3FREE, THYROIDAB in the last 72 hours. Anemia Panel: No results for input(s): VITAMINB12, FOLATE, FERRITIN, TIBC, IRON, RETICCTPCT in the last 72 hours. Urine analysis:    Component Value Date/Time   COLORURINE YELLOW 11/05/2017 1624   APPEARANCEUR CLEAR 11/05/2017 1624   LABSPEC 1.012 11/05/2017 1624   PHURINE 5.0 11/05/2017 1624   GLUCOSEU NEGATIVE 11/05/2017 1624   HGBUR NEGATIVE 11/05/2017 1624   BILIRUBINUR NEGATIVE 11/05/2017 1624   KETONESUR NEGATIVE 11/05/2017 1624   PROTEINUR NEGATIVE 11/05/2017 1624   UROBILINOGEN 1.0 06/06/2008 0949   NITRITE NEGATIVE 11/05/2017 1624   LEUKOCYTESUR NEGATIVE 11/05/2017 1624    Radiological Exams on Admission: Dg Chest 2 View  Result Date: 11/05/2017 CLINICAL DATA:  Mid chest tightness and shortness of breath for 2 days. EXAM: CHEST - 2 VIEW COMPARISON:  Single-view of the chest 09/15/2017. PA and lateral chest 07/10/2017. CT chest 03/20/2017. FINDINGS: There is cardiomegaly without edema. Small bilateral pleural effusions are seen on all the comparison examinations and have slightly increased since the most recent study. There is associated basilar atelectasis. Aortic atherosclerosis is noted. No acute or focal bony abnormality. IMPRESSION: Chronic small bilateral  pleural effusions have increased somewhat since the most recent examination. Cardiomegaly without edema. Atherosclerosis. Electronically Signed   By: Inge Rise M.D.   On: 11/05/2017 12:35    EKG: Independently reviewed. Afib RVR.   Assessment/Plan Principal Problem:   Atrial fibrillation with RVR ([place]), new onset Active Problems:   CKD (chronic kidney disease) stage 3, GFR 30-59 ml/min (HCC), baseline Cr 1.5   DM2 (diabetes mellitus, type 2) (HCC)   Acute on chronic combined systolic and diastolic CHF (congestive heart failure) (HCC)   Bilateral pleural effusion   Esophageal varices in cirrhosis (HCC)   SOB (shortness of breath)   Non-ischemic cardiomyopathy ([place])   Other secondary pulmonary hypertension (HCC)   RBBB   Cardiac amyloidosis (HCC)   Kappa light chain myeloma (HCC)   Leukocytosis   Elevated troponin   Elevated brain natriuretic peptide (BNP) level, acute on chronic   Hypotension   Plan: -admit to step down unit as pt is on amiodarone drip and in hypotensive status which has not improved yet -appreciate cardiology input. New onset afib RVR is likely due to acute on chronic combined CHF exacerbation. Iv lasix 40 mg bid as BP can tolerate, hold off BB or ACEI/ARB or aldactone due to hypotension; measure I's/o's, daily weight, and renal function as well as electrolytes -given high CHA2DS2-VAS score=5, heparin drip has initiated, will continue for now -given persistent afib RVR and hypotension, just started on amiodarone drip per cardiology, will give one time iv digoxin as the HR now 147 and SBP 93. theorectically pt will benefit from DCCV, however, he has underlying varices due to cirrhosis, therefore not a great candidate for TEE.  -  elevated troponin is due to Afib RVR and CHF exacerbation, will continue to cycle  DVT prophylaxis: heparin drip  Code Status: full code  Family Communication: 2 daughters at bedside  Disposition Plan: home with [facility], in 3-4 days    Consults called: cardiology Admission status: step down unit   Paticia Stack MD [facility] Pager [phone]  If 7PM-7AM, please contact night-coverage [email or web address] Password [facility]  11/05/2017, 6:27 PM

## 2017-11-05 NOTE — Telephone Encounter (Signed)
Advanced Heart Failure Triage Encounter  Patient Name: Dillon Andrews  Date of Call: 11/05/17  Problem:  Patient daughter called to report he is having weakness/fatigue X's 2-3 days, unable to eat, 5 lb wt gain since Sunday, redness in stool, and increased shortness of breath. Daughter believes she should take him to ER.   Plan:  I confirmed with Daughter that is best with all his symptoms she should take him to the ER.  No further questions.   Darron Doom, RN

## 2017-11-05 NOTE — Progress Notes (Signed)
ANTICOAGULATION CONSULT NOTE - Initial Consult  Pharmacy Consult for heparin Indication: atrial fibrillation  No Known Allergies  Patient Measurements: Height: 5' 9"  (175.3 cm) Weight: 185 lb 6.5 oz (84.1 kg) IBW/kg (Calculated) : 70.7 Heparin Dosing Weight: 84.1kg  Vital Signs: Temp: 98.2 F (36.8 C) (07/24 1156) Temp Source: Oral (07/24 1156) BP: 91/59 (07/24 1330) Pulse Rate: 118 (07/24 1330)  Labs: Recent Labs    11/05/17 1212 11/05/17 1310  HGB 13.1  --   HCT 42.1  --   PLT 157  --   LABPROT  --  16.3*  INR  --  1.32  CREATININE 1.54*  --     Estimated Creatinine Clearance: 39.5 mL/min (A) (by C-G formula based on SCr of 1.54 mg/dL (H)).   Medical History: Past Medical History:  Diagnosis Date  . Anemia 11/2016  . Arthritis    "some; hands/fingers" (06/25/2017)  . Atrial tachycardia ([place])   . Barrett's esophagus   . Basal cell carcinoma    "face, arms, hands" (06/25/2017)  . Chicken pox   . Cholelithiasis 11/2016   S/P LAP CHOLE  . Chronic combined systolic and diastolic CHF (congestive heart failure) ([place])   . CKD (chronic kidney disease), stage III ([place])   . Esophageal varices (HCC)    Secondary to NAFLD  . Family history of adverse reaction to anesthesia    "daughter's sat drop very low; last OR had to be reintubated" (06/25/2017)  . GERD (gastroesophageal reflux disease)   . Goals of care, counseling/discussion 10/22/2017  . High cholesterol   . Hypertension   . Kappa light chain myeloma ([place]) 10/22/2017  . Mitral regurgitation   . NAFLD (nonalcoholic fatty liver disease)   . NICM (nonischemic cardiomyopathy) ([place])    Echo 06/2017: Severe LVH.  EF 25 to 30% with mild HK of the baseline after septal wall.  Moderate to severely dilated right atrium, moderately dilated RV. (Concern fr ? amyloidosis).  . NSVT (nonsustained ventricular tachycardia) ([place])   . Pneumonia 09/2016  . RBBB   . Tricuspid regurgitation   . Type II diabetes mellitus (HCC)      Medications:  Infusions:  . heparin    . sodium chloride 250 mL (11/05/17 1331)    Assessment: 71 yom presented to the ED with CP an SOB. Found to be in new onset afib. Starting IV heparin for anticoagulation. Baseline CBC is WNL and he is not on anticoagulation PTA.   Goal of Therapy:  Heparin level 0.3-0.7 units/ml Monitor platelets by anticoagulation protocol: Yes   Plan:  Heparin bolus 4500 units IV x 1 Heparin gtt 1200 units/hr Check an 8 hr heparin level Daily heparin level and CBC  Dillon Andrews, Rande Lawman 11/05/2017,2:03 PM

## 2017-11-05 NOTE — ED Provider Notes (Signed)
[facility] EMERGENCY DEPARTMENT Provider Note   CSN: [id number] Arrival date & time: 11/05/17  1149     History   Chief Complaint Chief Complaint  Patient presents with  . Chest Pain  . Shortness of Breath    HPI Dillon Andrews is a 77 y.o. male.  HPI Pt states he has not felt well the last few days.   He has been fatigued, appetite has been poor (although this has been a chronic issue), he also started having some shortness of breath and chest pain.  The shortness of breath is constant and he notices it with any mild exertion.  That seems to come and go, lasts a few minutes,  and is not related to exertion.   He also noted that his heart was racing last week into the 130s. He has several medical problems including kappa light chain myeloma.  He is getting chemo treatment for that. Pt denies any chest pain right now.   Past Medical History:  Diagnosis Date  . Anemia 11/2016  . Arthritis    "some; hands/fingers" (06/25/2017)  . Atrial tachycardia ([place])   . Barrett's esophagus   . Basal cell carcinoma    "face, arms, hands" (06/25/2017)  . Chicken pox   . Cholelithiasis 11/2016   S/P LAP CHOLE  . Chronic combined systolic and diastolic CHF (congestive heart failure) ([place])   . CKD (chronic kidney disease), stage III ([place])   . Esophageal varices (HCC)    Secondary to NAFLD  . Family history of adverse reaction to anesthesia    "daughter's sat drop very low; last OR had to be reintubated" (06/25/2017)  . GERD (gastroesophageal reflux disease)   . Goals of care, counseling/discussion 10/22/2017  . High cholesterol   . Hypertension   . Kappa light chain myeloma ([place]) 10/22/2017  . Mitral regurgitation   . NAFLD (nonalcoholic fatty liver disease)   . NICM (nonischemic cardiomyopathy) ([place])    Echo 06/2017: Severe LVH.  EF 25 to 30% with mild HK of the baseline after septal wall.  Moderate to severely dilated right atrium, moderately dilated RV. (Concern fr ?  amyloidosis).  . NSVT (nonsustained ventricular tachycardia) ([place])   . Pneumonia 09/2016  . RBBB   . Tricuspid regurgitation   . Type II diabetes mellitus [facility])     Patient Active Problem List   Diagnosis Date Noted  . Kappa light chain myeloma ([place]) 10/22/2017  . Goals of care, counseling/discussion 10/22/2017  . Fatigue 08/18/2017  . Anemia 08/18/2017  . Cardiac amyloidosis ([place]) 07/18/2017  . Hypokalemia 06/25/2017  . Non-ischemic cardiomyopathy ([place]) 05/12/2017  . Mitral regurgitation 05/12/2017  . Other secondary pulmonary hypertension ([place]) 05/12/2017  . RBBB 05/12/2017  . Type II diabetes mellitus ([place])   . Essential hypertension   . CKD (chronic kidney disease), stage III ([place])   . SOB (shortness of breath) 03/05/2017  . Insomnia 03/05/2017  . Stress 03/05/2017  . Restless leg syndrome 03/05/2017  . Cholecystitis 11/22/2016  . CKD (chronic kidney disease) stage 3, GFR 30-59 ml/min (HCC) 11/19/2016  . DM2 (diabetes mellitus, type 2) ([place]) 11/19/2016  . Chronic combined systolic and diastolic heart failure ([place]) 11/19/2016  . Bilateral pleural effusion 11/19/2016  . Epigastric pain 11/19/2016  . Abnormal serum protein electrophoresis 11/19/2016  . Esophageal varices in cirrhosis ([place]) 11/19/2016    Past Surgical History:  Procedure Laterality Date  . BACK SURGERY    . BASAL CELL CARCINOMA EXCISION Right  temple; "had to do skin graft"  . CARDIAC CATHETERIZATION  09/2016   Minimal, non-occlusive CAD, EF 45% [facility])  . CARPAL TUNNEL RELEASE Bilateral   . CATARACT EXTRACTION W/ INTRAOCULAR LENS  IMPLANT, BILATERAL Bilateral   . CHOLECYSTECTOMY N/A 11/22/2016   Procedure: LAPAROSCOPIC CHOLECYSTECTOMY;  Surgeon: Coralie Keens, MD;  Location: [facility];  Service: General;  Laterality: N/A;  . JOINT REPLACEMENT    . LUMBAR DISC SURGERY  X 2  . TONSILLECTOMY AND ADENOIDECTOMY    . TOTAL KNEE ARTHROPLASTY Left 2010  . TRANSESOPHAGEAL ECHOCARDIOGRAM   09/2016   Mild global LV hypokinesis. Moderate MR. Moderate TR. [facility])  . TRANSTHORACIC ECHOCARDIOGRAM  10/2016   [facility]) EF 35-40% with moderate HK. Normal RV function. Moderately severe TR.  Marland Kitchen TRANSTHORACIC ECHOCARDIOGRAM  03/2017; 06/2017    A) ([place]) mild concentric LVH. EF of 40-45%. Diffuse HK with no RWMA. GR 2 DD. Septal dyssynergy. Mild MR, moderate TR. Severe RA dilation..;; B) Severe LVH.  EF 25 to 30% with mild HK of Septal wall.  Mod-severely dilated RA & Mod dilated RV.        Home Medications    Prior to Admission medications   Medication Sig Start Date End Date Taking? Authorizing Provider  aspirin EC 81 MG tablet Take 81 mg by mouth daily. 12/23/14  Yes [provider]  carvedilol (COREG) 6.25 MG tablet TAKE 1 TABLET BY MOUTH TWICE A DAY WITH MEALS 10/28/17  Yes Leonie Man, MD  dexamethasone (DECADRON) 4 MG tablet Take 5 tablets (20 mg total) by mouth once a week. 10/30/17  Yes Volanda Napoleon, MD  dronabinol (MARINOL) 2.5 MG capsule Take 1 capsule (2.5 mg total) by mouth 2 (two) times daily before lunch and supper. 10/22/17  Yes Volanda Napoleon, MD  famciclovir (FAMVIR) 250 MG tablet Take 1 tablet (250 mg total) by mouth daily. 10/22/17  Yes Volanda Napoleon, MD  gabapentin (NEURONTIN) 800 MG tablet TAKE 1 TABLET BY MOUTH AT BEDTIME 09/23/17  Yes [name], MD  lenalidomide (REVLIMID) 20 MG capsule Take 1 capsule (20 mg total) by mouth daily. Take 1 capsule at bedtime for 21 days on and 7 days off. Swallow whole.  Do not Crush. 10/22/17  Yes Volanda Napoleon, MD  magnesium oxide (MAG-OX) 400 MG tablet Take 400 mg by mouth daily.   Yes [provider]  Melatonin 5 MG TABS Take 5 mg by mouth at bedtime.    Yes [provider]  omeprazole (PRILOSEC) 40 MG capsule Take 1 capsule (40 mg total) by mouth daily. 07/31/17  Yes [name], MD  ondansetron (ZOFRAN) 4 MG tablet Take 1 tablet (4 mg  total) by mouth every 8 (eight) hours as needed for nausea or vomiting. 10/08/17  Yes [name], MD  potassium chloride SA (K-DUR,KLOR-CON) 20 MEQ tablet TAKE 1 TO 2 TABLET DAILY AS DIRECTED 08/11/17  Yes Leonie Man, MD  Probiotic Product (PROBIOTIC DAILY PO) Take 1 tablet by mouth.   Yes [provider]  rosuvastatin (CRESTOR) 10 MG tablet TAKE 1 TABLET BY MOUTH ONCE DAILY 10/29/17  Yes [name], MD  torsemide (DEMADEX) 20 MG tablet Take 20 mg by mouth 2 (two) times daily.    Yes [provider]  traZODone (DESYREL) 50 MG tablet TAKE 1 TABLET(50 MG) BY MOUTH AT BEDTIME 10/29/17  Yes [name], MD  vitamin B-12 (CYANOCOBALAMIN) 500 MCG tablet  Take 500 mcg by mouth every other day.   Yes [provider]  zolpidem (AMBIEN) 5 MG tablet TAKE 1 TABLET BY MOUTH EVERY NIGHT AT BEDTIME AS NEEDED FOR SLEEP 10/23/17  Yes [name], MD  glucose blood test strip Use as instructed 03/05/17   [name], MD  VYNDAQEL 20 MG CAPS Take 20 mg by mouth daily. 11/03/17   [provider]    Family History Family History  Problem Relation Age of Onset  . Heart failure Mother   . Cancer Father   . Cataracts Father   . Breast cancer Sister   . Alcohol abuse Brother   . Liver disease Brother   . Autoimmune disease Daughter        Bechet's syndrome  . Cirrhosis Neg Hx     Social History Social History   Tobacco Use  . Smoking status: Former Smoker    Packs/day: 0.30    Years: 15.00    Pack years: 4.50    Types: Cigarettes    Last attempt to quit: 03/17/1977    Years since quitting: 40.6  . Smokeless tobacco: Never Used  Substance Use Topics  . Alcohol use: Yes    Comment: 06/25/2017 "nothing since 09/2016;  used to have glass of wine q hs"  . Drug use: No     Allergies   Patient has no known allergies.   Review of Systems Review of Systems  All other systems reviewed and are  negative.    Physical Exam Updated Vital Signs BP 101/84   Pulse (!) 112   Temp 98.2 F (36.8 C) (Oral)   Resp 19   Ht 1.753 m (5' 9" )   Wt 84.1 kg (185 lb 6.5 oz)   SpO2 96%   BMI 27.38 kg/m   Physical Exam  Constitutional: He appears well-developed and well-nourished. No distress.  HENT:  Head: Normocephalic and atraumatic.  Right Ear: External ear normal.  Left Ear: External ear normal.  Eyes: Conjunctivae are normal. Right eye exhibits no discharge. Left eye exhibits no discharge. No scleral icterus.  Neck: Neck supple. No tracheal deviation present.  Cardiovascular: Intact distal pulses. An irregularly irregular rhythm present. Tachycardia present.  Pulmonary/Chest: Effort normal and breath sounds normal. No stridor. No respiratory distress. He has no wheezes. He has no rales.  Abdominal: Soft. Bowel sounds are normal. He exhibits no distension. There is no tenderness. There is no rebound and no guarding.  Musculoskeletal: He exhibits no edema or tenderness.  Neurological: He is alert. He has normal strength. No cranial nerve deficit (no facial droop, extraocular movements intact, no slurred speech) or sensory deficit. He exhibits normal muscle tone. He displays no seizure activity. Coordination normal.  Skin: Skin is warm and dry. No rash noted.  Psychiatric: He has a normal mood and affect.  Nursing note and vitals reviewed.    ED Treatments / Results  Labs (all labs ordered are listed, but only abnormal results are displayed) Labs Reviewed  BASIC METABOLIC PANEL - Abnormal; Notable for the following components:      Result Value   Glucose, Bld 215 (*)    BUN 38 (*)    Creatinine, Ser 1.54 (*)    GFR calc non Af Amer 41 (*)    GFR calc Af Amer 48 (*)    All other components within normal limits  CBC - Abnormal; Notable for the following components:   WBC 13.2 (*)    RDW 18.7 (*)  All other components within normal limits  BRAIN NATRIURETIC PEPTIDE -  Abnormal; Notable for the following components:   B Natriuretic Peptide 515.3 (*)    All other components within normal limits  PROTIME-INR - Abnormal; Notable for the following components:   Prothrombin Time 16.3 (*)    All other components within normal limits  I-STAT TROPONIN, ED - Abnormal; Notable for the following components:   Troponin i, poc 0.63 (*)    All other components within normal limits  HEPARIN LEVEL (UNFRACTIONATED)    EKG EKG Interpretation  Date/Time:  Wednesday November 05 2017 11:53:24 EDT Ventricular Rate:  112 PR Interval:    QRS Duration: 150 QT Interval:  366 QTC Calculation: 499 R Axis:   -78 Text Interpretation:  Atrial fibrillation with rapid ventricular response Left axis deviation Right bundle branch block Inferior infarct , age undetermined Anterior infarct , age undetermined T wave abnormality, consider lateral ischemia Abnormal ECG atrial fibrillation is new since previous tracings in march of this year Confirmed by Dorie Rank [phone]) on 11/05/2017 1:00:04 PM   Radiology Dg Chest 2 View  Result Date: 11/05/2017 CLINICAL DATA:  Mid chest tightness and shortness of breath for 2 days. EXAM: CHEST - 2 VIEW COMPARISON:  Single-view of the chest 09/15/2017. PA and lateral chest 07/10/2017. CT chest 03/20/2017. FINDINGS: There is cardiomegaly without edema. Small bilateral pleural effusions are seen on all the comparison examinations and have slightly increased since the most recent study. There is associated basilar atelectasis. Aortic atherosclerosis is noted. No acute or focal bony abnormality. IMPRESSION: Chronic small bilateral pleural effusions have increased somewhat since the most recent examination. Cardiomegaly without edema. Atherosclerosis. Electronically Signed   By: Inge Rise M.D.   On: 11/05/2017 12:35    Procedures .Critical Care Performed by: Dorie Rank, MD Authorized by: Dorie Rank, MD   Critical care provider statement:    Critical  care time (minutes):  30   Critical care was time spent personally by me on the following activities:  Discussions with consultants, evaluation of patient's response to treatment, examination of patient, ordering and performing treatments and interventions, ordering and review of laboratory studies, ordering and review of radiographic studies, pulse oximetry, re-evaluation of patient's condition, obtaining history from patient or surrogate and review of old charts   (including critical care time)  Medications Ordered in ED Medications  aspirin chewable tablet 324 mg (324 mg Oral Not Given 11/05/17 1439)  heparin ADULT infusion 100 units/mL (25000 units/215m sodium chloride 0.45%) ( Intravenous Not Given 11/05/17 1438)  sodium chloride 0.9 % bolus 250 mL (0 mLs Intravenous Stopped 11/05/17 1436)  heparin bolus via infusion 4,500 Units (4,500 Units Intravenous Bolus from Bag 11/05/17 1437)     Initial Impression / Assessment and Plan / ED Course  I have reviewed the triage vital signs and the nursing notes.  Pertinent labs & imaging results that were available during my care of the patient were reviewed by me and considered in my medical decision making (see chart for details).  Clinical Course as of Nov 06 1531  Wed Nov 05, 2017  1304 BP is running low.  Pt appears comfortable.  Will hold off on nitrates or lasix right now.   Mild tachy but will hold off on rate controlling agents.  Will order small fluid bolus, consult cardiology   [JK]  1530 BP has improved with small bolus.    [JK]    Clinical Course User Index [JK] KDorie Rank MD  Pt presented to the ED with weakness, chest discomfort and shortness of breath.  Labs notable for mild renal insufficiency, elevated trop and bnp.  Suspect related to heart failure and the a fib/tachycardia rather than acute ishcemia. EKG also notable for new atrial fibrillation.  Heparin IV ordered.   Cardiology consulted.  Plan on admission for further  treatment.   Final Clinical Impressions(s) / ED Diagnoses   Final diagnoses:  Chest pain, unspecified type  Atrial fibrillation, unspecified type ([place])  Elevated troponin      Dorie Rank, MD 11/05/17 [phone]

## 2017-11-06 ENCOUNTER — Inpatient Hospital Stay: Payer: Medicare HMO

## 2017-11-06 DIAGNOSIS — I4891 Unspecified atrial fibrillation: Secondary | ICD-10-CM

## 2017-11-06 DIAGNOSIS — I482 Chronic atrial fibrillation: Secondary | ICD-10-CM

## 2017-11-06 DIAGNOSIS — Z7982 Long term (current) use of aspirin: Secondary | ICD-10-CM

## 2017-11-06 DIAGNOSIS — E854 Organ-limited amyloidosis: Secondary | ICD-10-CM

## 2017-11-06 DIAGNOSIS — I5043 Acute on chronic combined systolic (congestive) and diastolic (congestive) heart failure: Secondary | ICD-10-CM

## 2017-11-06 DIAGNOSIS — Z87891 Personal history of nicotine dependence: Secondary | ICD-10-CM

## 2017-11-06 LAB — CBC
HCT: 39.7 % (ref 39.0–52.0)
Hemoglobin: 12.6 g/dL — ABNORMAL LOW (ref 13.0–17.0)
MCH: 27.8 pg (ref 26.0–34.0)
MCHC: 31.7 g/dL (ref 30.0–36.0)
MCV: 87.6 fL (ref 78.0–100.0)
Platelets: 128 10*3/uL — ABNORMAL LOW (ref 150–400)
RBC: 4.53 MIL/uL (ref 4.22–5.81)
RDW: 18.6 % — ABNORMAL HIGH (ref 11.5–15.5)
WBC: 9.3 10*3/uL (ref 4.0–10.5)

## 2017-11-06 LAB — GLUCOSE, CAPILLARY
GLUCOSE-CAPILLARY: 139 mg/dL — AB (ref 70–99)
Glucose-Capillary: 202 mg/dL — ABNORMAL HIGH (ref 70–99)
Glucose-Capillary: 156 mg/dL — ABNORMAL HIGH (ref 70–99)
Glucose-Capillary: 167 mg/dL — ABNORMAL HIGH (ref 70–99)
Glucose-Capillary: 163 mg/dL — ABNORMAL HIGH (ref 70–99)

## 2017-11-06 LAB — HEPARIN LEVEL (UNFRACTIONATED): Heparin Unfractionated: 0.8 IU/mL — ABNORMAL HIGH (ref 0.30–0.70)

## 2017-11-06 LAB — BASIC METABOLIC PANEL
Anion gap: 10 (ref 5–15)
BUN: 37 mg/dL — ABNORMAL HIGH (ref 8–23)
CHLORIDE: 101 mmol/L (ref 98–111)
CO2: 28 mmol/L (ref 22–32)
Calcium: 8.8 mg/dL — ABNORMAL LOW (ref 8.9–10.3)
Creatinine, Ser: 1.46 mg/dL — ABNORMAL HIGH (ref 0.61–1.24)
GFR calc non Af Amer: 44 mL/min — ABNORMAL LOW (ref >60)
GFR, EST AFRICAN AMERICAN: 51 mL/min — AB (ref >60)
Glucose, Bld: 161 mg/dL — ABNORMAL HIGH (ref 70–99)
POTASSIUM: 4 mmol/L (ref 3.5–5.1)
Sodium: 139 mmol/L (ref 135–145)

## 2017-11-06 LAB — URINE CULTURE

## 2017-11-06 LAB — MAGNESIUM: Magnesium: 1.9 mg/dL (ref 1.7–2.4)

## 2017-11-06 MED ORDER — LORAZEPAM 0.5 MG PO TABS
0.2500 mg | ORAL_TABLET | Freq: Two times a day (BID) | ORAL | Status: DC | PRN
  Administered 2017-11-06 – 2017-11-25 (×7): 0.25 mg via ORAL
  Filled 2017-11-06 (×8): qty 1

## 2017-11-06 MED ORDER — FUROSEMIDE 10 MG/ML IJ SOLN
40.0000 mg | Freq: Once | INTRAMUSCULAR | Status: AC
  Administered 2017-11-06: 40 mg via INTRAVENOUS
  Filled 2017-11-06: qty 4

## 2017-11-06 MED ORDER — APIXABAN 5 MG PO TABS
5.0000 mg | ORAL_TABLET | Freq: Two times a day (BID) | ORAL | Status: DC
Start: 2017-11-06 — End: 2017-11-24
  Administered 2017-11-06 – 2017-11-23 (×35): 5 mg via ORAL
  Filled 2017-11-06 (×35): qty 1

## 2017-11-06 MED ORDER — METOLAZONE 5 MG PO TABS
2.5000 mg | ORAL_TABLET | Freq: Once | ORAL | Status: AC
  Administered 2017-11-06: 2.5 mg via ORAL
  Filled 2017-11-06: qty 1

## 2017-11-06 MED ORDER — OFF THE BEAT BOOK
Freq: Once | Status: AC
  Administered 2017-11-06: 1
  Filled 2017-11-06: qty 1

## 2017-11-06 MED ORDER — FUROSEMIDE 10 MG/ML IJ SOLN
80.0000 mg | Freq: Two times a day (BID) | INTRAMUSCULAR | Status: DC
  Administered 2017-11-06: 80 mg via INTRAVENOUS
  Filled 2017-11-06 (×2): qty 8

## 2017-11-06 MED ORDER — POTASSIUM CHLORIDE CRYS ER 20 MEQ PO TBCR
40.0000 meq | EXTENDED_RELEASE_TABLET | Freq: Once | ORAL | Status: AC
  Administered 2017-11-06: 40 meq via ORAL
  Filled 2017-11-06: qty 2

## 2017-11-06 NOTE — Progress Notes (Addendum)
ANTICOAGULATION CONSULT NOTE   Pharmacy Consult for heparin Indication: atrial fibrillation  No Known Allergies  Patient Measurements: Height: 5' 9"  (175.3 cm) Weight: 187 lb 9.8 oz (85.1 kg) IBW/kg (Calculated) : 70.7 Heparin Dosing Weight: 84.1kg  Vital Signs: Temp: 98.3 F (36.8 C) (07/25 0449) Temp Source: Oral (07/25 0449) BP: 83/71 (07/25 0449) Pulse Rate: 99 (07/25 0449)  Labs: Recent Labs    11/05/17 1212 11/05/17 1310 11/05/17 2103 11/05/17 2159 11/06/17 0636  HGB 13.1  --   --   --  12.6*  HCT 42.1  --   --   --  39.7  PLT 157  --   --   --  128*  LABPROT  --  16.3*  --   --   --   INR  --  1.32  --   --   --   HEPARINUNFRC  --   --   --  0.82* 0.80*  CREATININE 1.54*  --   --   --  1.46*  TROPONINI  --   --  0.35*  --   --     Estimated Creatinine Clearance: 45.1 mL/min (A) (by C-G formula based on SCr of 1.46 mg/dL (H)).   Medical History: Past Medical History:  Diagnosis Date  . Anemia 11/2016  . Arthritis    "some; hands/fingers" (06/25/2017)  . Atrial tachycardia ([place])   . Barrett's esophagus   . Basal cell carcinoma    "face, arms, hands" (06/25/2017)  . Chicken pox   . Cholelithiasis 11/2016   S/P LAP CHOLE  . Chronic combined systolic and diastolic CHF (congestive heart failure) ([place])   . CKD (chronic kidney disease), stage III ([place])   . Esophageal varices (HCC)    Secondary to NAFLD  . Family history of adverse reaction to anesthesia    "daughter's sat drop very low; last OR had to be reintubated" (06/25/2017)  . GERD (gastroesophageal reflux disease)   . Goals of care, counseling/discussion 10/22/2017  . High cholesterol   . Hypertension   . Kappa light chain myeloma ([place]) 10/22/2017  . Mitral regurgitation   . NAFLD (nonalcoholic fatty liver disease)   . NICM (nonischemic cardiomyopathy) ([place])    Echo 06/2017: Severe LVH.  EF 25 to 30% with mild HK of the baseline after septal wall.  Moderate to severely dilated right atrium,  moderately dilated RV. (Concern fr ? amyloidosis).  . NSVT (nonsustained ventricular tachycardia) ([place])   . Pneumonia 09/2016  . RBBB   . Tricuspid regurgitation   . Type II diabetes mellitus (HCC)     Medications:  Infusions:  . sodium chloride    . amiodarone 30 mg/hr (11/06/17 9211)  . heparin 1,200 Units/hr (11/06/17 9417)    Assessment: 36 yom presented to the ED with CP an SOB. Found to be in new onset afib. Starting IV heparin for anticoagulation. Baseline CBC is WNL and he is not on anticoagulation PTA. Platelets and Hgb stable. No s/s of bleeding noted.   Heparin level today elevated at 0.8 on heparin 1200 units/hr.   Goal of Therapy:  Heparin level 0.3-0.7 units/ml Monitor platelets by anticoagulation protocol: Yes   Plan:  Heparin rate not adjusted yesterday Will decrease heparin gtt 1100 units/hr as planned Check an 8 hr heparin level Daily heparin level, CBC, and s/s of bleeding.   Gwenlyn Found, Sherian Rein D PGY1 Pharmacy Resident  Phone [phone] 11/06/2017   8:35 AM

## 2017-11-06 NOTE — Consult Note (Addendum)
Advanced Heart Failure Team Consult Note   Primary Physician: [name], MD PCP-Cardiologist:  Glenetta Hew, MD  Reason for Consultation: Acute on chronic systolic CHF in setting of Afib RVR  HPI:    Dillon Andrews is seen today for evaluation of acute on chronic systolic CHF in setting of Afib RVR  at the request of Dr. Gwenlyn Found.   Dillon Andrews is a 77 y.o. male with a hx of chronic combined CHF due to NICM, CKD III,presumedNASH(per pt, never proven),esophageal varices per chart(prior glass of wine occasionally but no heavy alcohol),HTN, HLD, DM, Barrett's esophagus, GERD, anemia, RBBB, TTR amyloidosis, and Kappa Light Chain myeloma.  Recently started on treatment for Myeloma with Velcade and Revlimid. He has had one dose of Velcade, and only 1 dose of Revlimid.   Last seen in CHF clinic 09/22/2017. Was relatively stable and no changes made at that time. We were still seeking approval for Tafamidis for his Wild-Type amyloidosis.   Presented to [facility] 11/05/17 with two weeks of worsening SOB. He noted HRs up into the 130s intermittently over that time period. He has had gradual weight gain over the past week along with intermittent chest discomfort with activity over the same time. On arrival to ED, noted to be in [place] Afib with RVR at 112 bpm. Other pertinent labs on admission include Cr 1.54 (baseline), WBC 13.2, Hgb 13.1, PLT 157, Trop 0.63, BNP 515. CXR with small bilateral pleural effusions, no pulmonary edema. Started on heparin gtt and amiodarone in ED. HF team asked to assume care with significant co-morbidities including cardiac amyloidosis.   He is feeling better today with dose of IV lasix (40 mg) last night. As above, has been feeling worse gradually over the past 2 weeks. Today, he denies SOB at rest or chest discomfort. No palpitations. Occasional lightheadedness with standing. No peripheral edema despite weight gradually climbing up. Not orthopneic  currently. Weight up 4-5 lbs at home.   Echo 06/2017 EF 25-30%, mild LAE, mild/mod RV dilation, mod/severe RA  Review of systems complete and found to be negative unless listed in HPI.    Home Medications Prior to Admission medications   Medication Sig Start Date End Date Taking? Authorizing Provider  aspirin EC 81 MG tablet Take 81 mg by mouth daily. 12/23/14  Yes [provider]  carvedilol (COREG) 6.25 MG tablet TAKE 1 TABLET BY MOUTH TWICE A DAY WITH MEALS 10/28/17  Yes Leonie Man, MD  dexamethasone (DECADRON) 4 MG tablet Take 5 tablets (20 mg total) by mouth once a week. 10/30/17  Yes Volanda Napoleon, MD  dronabinol (MARINOL) 2.5 MG capsule Take 1 capsule (2.5 mg total) by mouth 2 (two) times daily before lunch and supper. 10/22/17  Yes Volanda Napoleon, MD  famciclovir (FAMVIR) 250 MG tablet Take 1 tablet (250 mg total) by mouth daily. 10/22/17  Yes Volanda Napoleon, MD  gabapentin (NEURONTIN) 800 MG tablet TAKE 1 TABLET BY MOUTH AT BEDTIME 09/23/17  Yes [name], MD  lenalidomide (REVLIMID) 20 MG capsule Take 1 capsule (20 mg total) by mouth daily. Take 1 capsule at bedtime for 21 days on and 7 days off. Swallow whole.  Do not Crush. 10/22/17  Yes Volanda Napoleon, MD  magnesium oxide (MAG-OX) 400 MG tablet Take 400 mg by mouth daily.   Yes [provider]  Melatonin 5 MG TABS Take 5 mg by mouth at bedtime.    Yes [provider]  omeprazole (PRILOSEC) 40 MG capsule Take 1 capsule (40 mg total) by mouth daily. 07/31/17  Yes [name], MD  ondansetron (ZOFRAN) 4 MG tablet Take 1 tablet (4 mg total) by mouth every 8 (eight) hours as needed for nausea or vomiting. 10/08/17  Yes [name], MD  potassium chloride SA (K-DUR,KLOR-CON) 20 MEQ tablet TAKE 1 TO 2 TABLET DAILY AS DIRECTED 08/11/17  Yes Leonie Man, MD  Probiotic Product (PROBIOTIC DAILY PO) Take 1 tablet by mouth.   Yes [provider]  rosuvastatin  (CRESTOR) 10 MG tablet TAKE 1 TABLET BY MOUTH ONCE DAILY 10/29/17  Yes [name], MD  torsemide (DEMADEX) 20 MG tablet Take 20 mg by mouth 2 (two) times daily.    Yes [provider]  traZODone (DESYREL) 50 MG tablet TAKE 1 TABLET(50 MG) BY MOUTH AT BEDTIME 10/29/17  Yes [name], MD  vitamin B-12 (CYANOCOBALAMIN) 500 MCG tablet Take 500 mcg by mouth every other day.   Yes [provider]  zolpidem (AMBIEN) 5 MG tablet TAKE 1 TABLET BY MOUTH EVERY NIGHT AT BEDTIME AS NEEDED FOR SLEEP 10/23/17  Yes [name], MD  glucose blood test strip Use as instructed 03/05/17   [name], MD  VYNDAQEL 20 MG CAPS Take 20 mg by mouth daily. 11/03/17   [provider]    Past Medical History: Past Medical History:  Diagnosis Date  . Anemia 11/2016  . Arthritis    "some; hands/fingers" (06/25/2017)  . Atrial tachycardia ([place])   . Barrett's esophagus   . Basal cell carcinoma    "face, arms, hands" (06/25/2017)  . Chicken pox   . Cholelithiasis 11/2016   S/P LAP CHOLE  . Chronic combined systolic and diastolic CHF (congestive heart failure) ([place])   . CKD (chronic kidney disease), stage III ([place])   . Esophageal varices (HCC)    Secondary to NAFLD  . Family history of adverse reaction to anesthesia    "daughter's sat drop very low; last OR had to be reintubated" (06/25/2017)  . GERD (gastroesophageal reflux disease)   . Goals of care, counseling/discussion 10/22/2017  . High cholesterol   . Hypertension   . Kappa light chain myeloma ([place]) 10/22/2017  . Mitral regurgitation   . NAFLD (nonalcoholic fatty liver disease)   . NICM (nonischemic cardiomyopathy) ([place])    Echo 06/2017: Severe LVH.  EF 25 to 30% with mild HK of the baseline after septal wall.  Moderate to severely dilated right atrium, moderately dilated RV. (Concern fr ? amyloidosis).  . NSVT (nonsustained ventricular tachycardia) ([place])   . Pneumonia 09/2016  . RBBB   .  Tricuspid regurgitation   . Type II diabetes mellitus ([place])     Past Surgical History: Past Surgical History:  Procedure Laterality Date  . BACK SURGERY    . BASAL CELL CARCINOMA EXCISION Right    temple; "had to do skin graft"  . CARDIAC CATHETERIZATION  09/2016   Minimal, non-occlusive CAD, EF 45% [facility])  . CARPAL TUNNEL RELEASE Bilateral   . CATARACT EXTRACTION W/ INTRAOCULAR LENS  IMPLANT, BILATERAL Bilateral   . CHOLECYSTECTOMY N/A 11/22/2016   Procedure: LAPAROSCOPIC CHOLECYSTECTOMY;  Surgeon: Coralie Keens, MD;  Location: [place];  Service: General;  Laterality: N/A;  . JOINT REPLACEMENT    . LUMBAR DISC SURGERY  X 2  . TONSILLECTOMY AND ADENOIDECTOMY    . TOTAL KNEE ARTHROPLASTY Left 2010  . TRANSESOPHAGEAL ECHOCARDIOGRAM  09/2016  Mild global LV hypokinesis. Moderate MR. Moderate TR. [facility])  . TRANSTHORACIC ECHOCARDIOGRAM  10/2016   [facility]) EF 35-40% with moderate HK. Normal RV function. Moderately severe TR.  Marland Kitchen TRANSTHORACIC ECHOCARDIOGRAM  03/2017; 06/2017    A) ([place]) mild concentric LVH. EF of 40-45%. Diffuse HK with no RWMA. GR 2 DD. Septal dyssynergy. Mild MR, moderate TR. Severe RA dilation..;; B) Severe LVH.  EF 25 to 30% with mild HK of Septal wall.  Mod-severely dilated RA & Mod dilated RV.    Family History: Family History  Problem Relation Age of Onset  . Heart failure Mother   . Cancer Father   . Cataracts Father   . Breast cancer Sister   . Alcohol abuse Brother   . Liver disease Brother   . Autoimmune disease Daughter        Bechet's syndrome  . Cirrhosis Neg Hx     Social History: Social History   Socioeconomic History  . Marital status: Married    Spouse name: Not on file  . Number of children: 4  . Years of education: 87  . Highest education level: Not on file  Occupational History  . Occupation: retired  Scientific laboratory technician  . Financial resource strain: Not on file  . Food insecurity:     Worry: Not on file    Inability: Not on file  . Transportation needs:    Medical: Not on file    Non-medical: Not on file  Tobacco Use  . Smoking status: Former Smoker    Packs/day: 0.30    Years: 15.00    Pack years: 4.50    Types: Cigarettes    Last attempt to quit: 03/17/1977    Years since quitting: 40.6  . Smokeless tobacco: Never Used  Substance and Sexual Activity  . Alcohol use: Yes    Comment: 06/25/2017 "nothing since 09/2016;  used to have glass of wine q hs"  . Drug use: No  . Sexual activity: Not on file  Lifestyle  . Physical activity:    Days per week: Not on file    Minutes per session: Not on file  . Stress: Not on file  Relationships  . Social connections:    Talks on phone: Not on file    Gets together: Not on file    Attends religious service: Not on file    Active member of club or organization: Not on file    Attends meetings of clubs or organizations: Not on file    Relationship status: Not on file  Other Topics Concern  . Not on file  Social History Narrative   He is retired from [employer].  Previously worked for [employer] by his daughter --> also a patient of Dr. Ellyn Hack   Lives with wife in a one story home.    Education: high school.     Allergies:  No Known Allergies  Objective:    Vital Signs:   Temp:  [97.6 F (36.4 C)-98.3 F (36.8 C)] 98.3 F (36.8 C) (07/25 0449) Pulse Rate:  [40-121] 99 (07/25 0449) Resp:  [14-29] 15 (07/25 0449) BP: (81-117)/(54-84) 83/71 (07/25 0449) SpO2:  [91 %-100 %] 94 % (07/25 0449) Weight:  [185 lb 6.4 oz (84.1 kg)-187 lb 9.8 oz (85.1 kg)] 187 lb 9.8 oz (85.1 kg) (07/25 0500) Last BM Date: 11/05/17  Weight change: Filed Weights   11/05/17 1153 11/05/17 1400 11/06/17 0500  Weight: 185 lb 6.4  oz (84.1 kg) 185 lb 6.5 oz (84.1 kg) 187 lb 9.8 oz (85.1 kg)    Intake/Output:   Intake/Output Summary (Last 24 hours) at 11/06/2017 0857 Last data filed at 11/06/2017 0618 Gross per  24 hour  Intake 1062.71 ml  Output 635 ml  Net 427.71 ml      Physical Exam    General: Elderly appearing.  HEENT: normal Neck: supple. JVP 9-10 cm. Carotids 2+ bilat; no bruits. No lymphadenopathy or thyromegaly appreciated. Cor: PMI nondisplaced. Irregularly irregular.  Lungs: Diminished basilar sounds, clear anteriorly.  Abdomen: soft, nontender, nondistended. No hepatosplenomegaly. No bruits or masses. Good bowel sounds. Extremities: no cyanosis, clubbing, or rash. Trace ankle edema. Legs slightly cool to the touch. Neuro: alert & orientedx3, cranial nerves grossly intact. moves all 4 extremities w/o difficulty. Affect pleasant   Telemetry   Afib 90-100s, personally reviewed.   EKG    Afib RVR 112 bpm, personally reviewed.   Labs   Basic Metabolic Panel: Recent Labs  Lab 10/30/17 1257 11/05/17 1212 11/06/17 0636  NA 142 139 139  K 4.7 4.1 4.0  CL 103 99 101  CO2 34* 28 28  GLUCOSE 173* 215* 161*  BUN 28* 38* 37*  CREATININE 1.30* 1.54* 1.46*  CALCIUM 9.2 8.9 8.8*    Liver Function Tests: Recent Labs  Lab 10/30/17 1257  AST 39*  ALT 34  ALKPHOS 170*  BILITOT 1.4  PROT 6.7  ALBUMIN 3.3*   No results for input(s): LIPASE, AMYLASE in the last 168 hours. No results for input(s): AMMONIA in the last 168 hours.  CBC: Recent Labs  Lab 10/30/17 1257 11/05/17 1212 11/06/17 0636  WBC 8.8 13.2* 9.3  NEUTROABS 6.2  --   --   HGB 12.3* 13.1 12.6*  HCT 39.3 42.1 39.7  MCV 90.3 90.1 87.6  PLT 152 157 128*    Cardiac Enzymes: Recent Labs  Lab 11/05/17 2103  TROPONINI 0.35*    BNP: BNP (last 3 results) Recent Labs    06/25/17 1207 09/15/17 1401 11/05/17 1310  BNP 420.9* 393.0* 515.3*    ProBNP (last 3 results) No results for input(s): PROBNP in the last 8760 hours.   CBG: Recent Labs  Lab 11/05/17 2313 11/06/17 0103 11/06/17 0816  GLUCAP 249* 202* 156*    Coagulation Studies: Recent Labs    11/05/17 1310  LABPROT 16.3*  INR  1.32     Imaging   Dg Chest 2 View  Result Date: 11/05/2017 CLINICAL DATA:  Mid chest tightness and shortness of breath for 2 days. EXAM: CHEST - 2 VIEW COMPARISON:  Single-view of the chest 09/15/2017. PA and lateral chest 07/10/2017. CT chest 03/20/2017. FINDINGS: There is cardiomegaly without edema. Small bilateral pleural effusions are seen on all the comparison examinations and have slightly increased since the most recent study. There is associated basilar atelectasis. Aortic atherosclerosis is noted. No acute or focal bony abnormality. IMPRESSION: Chronic small bilateral pleural effusions have increased somewhat since the most recent examination. Cardiomegaly without edema. Atherosclerosis. Electronically Signed   By: Inge Rise M.D.   On: 11/05/2017 12:35      Medications:     Current Medications: . aspirin EC  81 mg Oral Daily  . dronabinol  2.5 mg Oral BID AC  . famciclovir  250 mg Oral Daily  . furosemide  40 mg Intravenous BID  . gabapentin  800 mg Oral QHS  . insulin aspart  0-15 Units Subcutaneous TID WC  . insulin aspart  0-5  Units Subcutaneous QHS  . insulin aspart  4 Units Subcutaneous TID WC  . LORazepam  1 mg Oral Once  . magnesium oxide  400 mg Oral Daily  . Melatonin  6 mg Oral QHS  . pantoprazole  40 mg Oral Daily  . potassium chloride SA  20 mEq Oral Daily  . rosuvastatin  10 mg Oral Daily  . sodium chloride flush  3 mL Intravenous Q12H  . traZODone  50 mg Oral QHS     Infusions: . sodium chloride    . amiodarone 30 mg/hr (11/06/17 9485)  . heparin 1,200 Units/hr (11/06/17 4627)     Patient Profile   Dillon Andrews is a 77 y.o. male with a hx of chronic combined CHF due to NICM, CKD III,presumedNASH(per pt, never proven),esophageal varices per chart(prior glass of wine occasionally but no heavy alcohol),HTN, HLD, DM, Barrett's esophagus, GERD, anemia, RBBB, TTR amyloidosis, and Kappa Light Chain myeloma.  Admitted to [facility] 11/05/17 in  setting of new Afib with RVR.   Assessment/Plan   1. Acute on chronic combined HF with TTR amyloidosis (Wild Type) - NICM by cath at [facility] 09/2016 with EF 40-45%. Echo 06/2017: EF 25-30%, mild LAE, mild/mod RV dilation, mod/severe RA. - PYP scan strongly suggestive of TTR amyloid, SPEP with 0.8% m-spike, UPEP with Bence Jones Protein positive, kappa type. 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  - Has been approved for Tafamidis, but his co-pay is $1000, which he cannot afford.  - Volume status elevated on exam.  - Increase lasix to 80 mg IV BID. Will give additional 40 mg this am to total 80. Will supp K and check Mg.  - Continue coreg 6.25 mg BID. - Continue spiro 12.5 mg daily - No ARB/entresto with CKD - Sees Dr Posey Pronto.   2. New onset Afib with RVR. - With rates as high as 130s. - Rx options limited with chronically soft pressures - Continue amiodarone gtt. - Continue heparin gtt. Anticipate transition to Eliquis 5 mg BID prior to discharge - Given significant esophogeal varices, poor candidate for TEE. May need AC for 3-4 weeks followed by DCCV.  - CHA2DS2/VASc is at least 6. (CHF, HTN, DM, CAD (mild, non-obstructive), and 2 for Age)  3. CKD III, baseline ~1.6 -Creatinine 1.64 on 09/15/2017   4. Recurrent bilateral effusions - Somewhat increased from March on CXR 11/05/17. Follow.   5. Kappa Lightchain Myeloma - Follows with Dr. Marin Olp who has seen this admission.  - Started on Bortezomib 10/30/16 and Revlimid 11/04/17. - Continue management per primary team   6. NASH with esophageal varices.  - Watch closely for bleeding with AC.  - Last EGD 06/21/2014 at [facility] for surveillance of Esophageal varices, Barrett's esophagus, and PMHx of Colon polyps. Showed two columns of small esophageal varices w/out stigmata, short segment Barrett's esophagus (without biopsies, due to presence of EVs), 5cm hiatal hernia, and hyperplastic small gastric nodules  7. DM2 - Per  primary  Medication concerns reviewed with patient and pharmacy team. Barriers identified: None at this time.   Length of Stay: 1  Annamaria Helling  11/06/2017, 8:57 AM  Advanced Heart Failure Team Pager [phone] (M-F; 7a - 4p)  Please contact [facility] for night-coverage after hours (4p -7a ) and weekends on [email or web address]  Patient seen with PA, agree with the above note.    He feels like his HR has been up for > 1 week.  He developed progressive exertional dyspnea  to the point of dyspnea at rest => came to ER yesterday.  He was found to be in [employer] and has been started on amiodarone gtt and heparin gtt.  Creatinine near baseline at 1.4 today. Currently, he is short of breath and asking for his oxygen to be turned up.   On exam, JVP 16 cm.  Irregular S1S2, mildly tachy with 1/6 HSM LLSB.  No peripheral edema.  Decrease BS bilateral bases.   1. Acute on chronic systolic CHF: Echo 3/96 with EF 25-30%. Nonischemic cardiomyopathy.  PYP scan strongly suggestive of TTR amyloidosis (wild type, negative genetic testing). He has multiple myeloma as well.  He appears to have an exacerbation of CHF triggered by the onset of atrial fibrillation with RVR.  On exam, he is very volume overloaded with NYHA class IIIb symptoms.  - Lasix 80 mg IV bid and will give a dose of metolazone 2.5 x 1 today.  - Continue home Coreg and spironolactone.  - No ACEI/ARB/ARNI for now with CKD and diuresis.  2. Atrial fibrillation: New-onset afib with RVR over the last couple of weeks, likely triggered CHF.  He is now on amiodarone gtt with HR in 100s.  He is on heparin gtt.  - Start Eliquis and stop heparin.   - Continue amiodarone gtt for now.  - Will need 5 doses Eliquis prior to DCCV => would diurese aggressively then plan TEE-guided DCCV on Monday versus waiting 3 wks with DCCV as outpatient w/o TEE.  Ultimately, he needs to get back into NSR.  3. Esophageal varices: Small on EGD in 3/16.  Likely  related to NASH.  Not an absolute contraindication to TEE but will need to be careful. 4. CKD: Stage 3.  Watch closely with diuresis.  5. TTR amyloidosis: Wild type.  Have looked into tafamidis but currently copay prohibitive.  Will check clinic nurse about any options (have been working on it).  6. Light chain myeloma: Velcade and Revlimid per Dr. Marin Olp.   Loralie Champagne 11/06/2017 11:45 AM

## 2017-11-06 NOTE — Evaluation (Signed)
Physical Therapy Evaluation Patient Details Name: Dillon Andrews MRN: [id number] DOB: Apr 29, 1940 Today's Date: 11/06/2017   History of Present Illness  Pt is a 77 y/o male admitted secondary to worsening SOB and chest tightness. Found to have a fib with RVR. PMH inlcudes kappa light chain myeloma (currently undergoing chemotherapy), CHF, nonischemic cardiomyopathy, pulmonary HTN, and CKD.   Clinical Impression  Pt admitted secondary to problem above with deficits below. Pt requiring min guard A for mobility to chair using RW. One LOB noted, however, pt able to self recover. HR elevated outside of target HR range during transfers (to 113 bpm), however, returned to low to mid 90s upon seated rest. Anticipate pt will progress well, and be able to progress to home with HHPT. Will continue to follow acutely to maximize functional mobility independence and safety.     Follow Up Recommendations Home health PT;Supervision for mobility/OOB    Equipment Recommendations  None recommended by PT    Recommendations for Other Services       Precautions / Restrictions Precautions Precautions: Other (comment) Precaution Comments: watch HR; target HR 65-105 bpm Restrictions Weight Bearing Restrictions: No      Mobility  Bed Mobility Overal bed mobility: Needs Assistance Bed Mobility: Supine to Sit     Supine to sit: Supervision     General bed mobility comments: Supervision for safety. Increased time required.   Transfers Overall transfer level: Needs assistance Equipment used: Rolling walker (2 wheeled) Transfers: Sit to/from Stand Sit to Stand: Min guard         General transfer comment: Min guard for steadying assist. Verbal cues for safe hand placement. Used momentum to stand.   Ambulation/Gait Ambulation/Gait assistance: Min guard Gait Distance (Feet): 2 Feet Assistive device: Rolling walker (2 wheeled) Gait Pattern/deviations: Step-through pattern;Decreased stride  length Gait velocity: Decreased    General Gait Details: Slow, cautious gait. One LOB noted, however, able to self recover. Min guard A for safety. Pt's HR elevated to 113 bpm during transfer, however, decreased back to low to mid 90s upon seated rest.   Stairs            Wheelchair Mobility    Modified Rankin (Stroke Patients Only)       Balance Overall balance assessment: Needs assistance Sitting-balance support: No upper extremity supported;Feet supported Sitting balance-Leahy Scale: Good     Standing balance support: Bilateral upper extremity supported;During functional activity Standing balance-Leahy Scale: Poor Standing balance comment: Reliant on BUE support.                              Pertinent Vitals/Pain Pain Assessment: No/denies pain    Home Living Family/patient expects to be discharged to:: Private residence Living Arrangements: Spouse/significant other Available Help at Discharge: Family;Available 24 hours/day Type of Home: House Home Access: Stairs to enter Entrance Stairs-Rails: None Entrance Stairs-Number of Steps: 2 Home Layout: One level Home Equipment: Walker - 4 wheels Additional Comments: Daughter lives right across the street.     Prior Function Level of Independence: Independent with assistive device(s)         Comments: Used rollator for ambulation      Hand Dominance   Dominant Hand: Right    Extremity/Trunk Assessment   Upper Extremity Assessment Upper Extremity Assessment: Overall WFL for tasks assessed    Lower Extremity Assessment Lower Extremity Assessment: Generalized weakness    Cervical / Trunk Assessment Cervical / Trunk Assessment: Normal  Communication   Communication: No difficulties  Cognition Arousal/Alertness: Awake/alert Behavior During Therapy: WFL for tasks assessed/performed Overall Cognitive Status: Within Functional Limits for tasks assessed                                         General Comments General comments (skin integrity, edema, etc.): Pt's daughter present at beginning of session.     Exercises     Assessment/Plan    PT Assessment Patient needs continued PT services  PT Problem List Decreased strength;Decreased balance;Decreased mobility;Decreased activity tolerance;Cardiopulmonary status limiting activity;Decreased knowledge of use of DME       PT Treatment Interventions DME instruction;Gait training;Stair training;Functional mobility training;Therapeutic activities;Therapeutic exercise;Balance training;Patient/family education    PT Goals (Current goals can be found in the Care Plan section)  Acute Rehab PT Goals Patient Stated Goal: "to feel better"  PT Goal Formulation: With patient Time For Goal Achievement: 11/20/17 Potential to Achieve Goals: Good    Frequency Min 3X/week   Barriers to discharge        Co-evaluation               AM-PAC PT "6 Clicks" Daily Activity  Outcome Measure Difficulty turning over in bed (including adjusting bedclothes, sheets and blankets)?: A Little Difficulty moving from lying on back to sitting on the side of the bed? : A Little Difficulty sitting down on and standing up from a chair with arms (e.g., wheelchair, bedside commode, etc,.)?: Unable Help needed moving to and from a bed to chair (including a wheelchair)?: A Little Help needed walking in hospital room?: A Little Help needed climbing 3-5 steps with a railing? : A Lot 6 Click Score: 15    End of Session Equipment Utilized During Treatment: Gait belt Activity Tolerance: Patient tolerated treatment well Patient left: in chair;with call bell/phone within reach Nurse Communication: Mobility status PT Visit Diagnosis: Unsteadiness on feet (R26.81);Muscle weakness (generalized) (M62.81);Other abnormalities of gait and mobility (R26.89)    Time: 7078-6754 PT Time Calculation (min) (ACUTE ONLY): 19 min   Charges:   PT  Evaluation $PT Eval Moderate Complexity: 1 Mod          Leighton Ruff, PT, DPT  Acute Rehabilitation Services  Pager: [phone]   Rudean Hitt 11/06/2017, 5:46 PM

## 2017-11-06 NOTE — Discharge Instructions (Signed)

## 2017-11-06 NOTE — Progress Notes (Signed)
PROGRESS NOTE    Dillon Andrews  DUK:[id number] DOB: Apr 07, 1941 DOA: 11/05/2017 PCP: [name], MD   Brief Narrative: Patient is a 77 year old male with past medical history of liver cirrhosis with esophageal varices, combined systolic and diastolic CHF with ejection fraction of 25 to 30%, nonischemic cardiomyopathy, pulmonary hypertension, cardiac amyloidosis, CKD stage III, kappa light chain myeloma on chemotherapy was brought to the emergency department due to shortness of breath.  Patient was found to be in A. fib with RVR with hypotension on presentation.  Cardiology was consulted.  Started on amiodarone drip.  Also started on heparin for new onset A. fib.  Heart failure team is following now.  Assessment & Plan:   Principal Problem:   Atrial fibrillation with RVR ([place]), new onset Active Problems:   CKD (chronic kidney disease) stage 3, GFR 30-59 ml/min (HCC), baseline Cr 1.5   DM2 (diabetes mellitus, type 2) (HCC)   Acute on chronic combined systolic and diastolic CHF (congestive heart failure) (HCC)   Bilateral pleural effusion   Esophageal varices in cirrhosis (HCC)   SOB (shortness of breath)   Non-ischemic cardiomyopathy ([place])   Other secondary pulmonary hypertension (HCC)   RBBB   Cardiac amyloidosis (HCC)   Kappa light chain myeloma (HCC)   Leukocytosis   Elevated troponin   Elevated brain natriuretic peptide (BNP) level, acute on chronic   Hypotension  A. fib with RVR: Currently rate is improving.  On amiodarone drip.  Blood pressure soft but  stable.  Started on Eliquis.  He has a history of esophageal varices so is a poor candidate for TEE.  He might need anticoagulants for 3 to 4 weeks followed by DCCV.CHA2DS2/VASc is at least 6.  CHF: Acute on chronic combined heart failure with amyloidosis.  Heart failure team is following.  Continue IV Lasix.  Echo on 3/19 was showing EF of 25 to 30%.  Thought to be associated with amyloidosis.  Currently on Lasix 80  mg IV twice daily.  Additional dose given.  We will continue to monitor his electrolytes.  Continue Coreg and spironolactone.  Not on ARB or Entresto due to CKD.  Cirrhosis with esophageal varices: Likely  NASH.  Follow-up with GI as an outpatient.  We will monitor him closely for bleeding especially with anticoagulation.  Kappa light chain myeloma: Follows with Dr. Marin Olp.  Just had chemotherapy  few days ago.  Was on bortezomib and Revlimid  CKD stage III: Baseline creatinine around 1.6.  Creatinine around baseline.  Diabetes mellitus type 2: We will continue monitor his blood sugars.  Continue current regimen.    DVT prophylaxis: Eliquis Code Status: Full Family Communication: Daughter present at the bedside Disposition Plan: To home in 2 to 3 days.  Waiting for stabilization of the heart rate.   Consultants: Heme-onc /Cardiology  Procedures:None  Antimicrobials:None  Subjective: Patient seen and examined at bedside this morning.  His heart rate is improving and is around 110s.  Blood pressure soft but acceptable.  Denies any shortness of breath or chest pain or any other discomfort.  Objective: Vitals:   11/05/17 2356 11/06/17 0449 11/06/17 0500 11/06/17 0900  BP: 117/77 (!) 83/71  96/73  Pulse: 74 99    Resp: 15 15    Temp: 98 F (36.7 C) 98.3 F (36.8 C)    TempSrc: Oral Oral    SpO2: 93% 94%  94%  Weight:   85.1 kg (187 lb 9.8 oz)   Height:  Intake/Output Summary (Last 24 hours) at 11/06/2017 1406 Last data filed at 11/06/2017 1100 Gross per 24 hour  Intake 1443.46 ml  Output 1060 ml  Net 383.46 ml   Filed Weights   11/05/17 1153 11/05/17 1400 11/06/17 0500  Weight: 84.1 kg (185 lb 6.4 oz) 84.1 kg (185 lb 6.5 oz) 85.1 kg (187 lb 9.8 oz)    Examination:  General exam: Appears calm and comfortable ,Not in distress,thin built HEENT:PERRL,Oral mucosa moist, Ear/Nose normal on gross exam Respiratory system: Bilateral decreased entry in the bases, no  crackles auscultated cardiovascular system: Afib with RRR. No JVD, murmurs, rubs, gallops or clicks. No pedal edema. Gastrointestinal system: Abdomen is nondistended, soft and nontender. No organomegaly or masses felt. Normal bowel sounds heard. Central nervous system: Alert and oriented. No focal neurological deficits. Extremities: No edema, no clubbing ,no cyanosis, distal peripheral pulses palpable. Skin: No rashes, lesions or ulcers,no icterus ,no pallor MSK: Normal muscle bulk,tone ,power Psychiatry: Judgement and insight appear normal. Mood & affect appropriate.     Data Reviewed: I have personally reviewed following labs and imaging studies  CBC: Recent Labs  Lab 11/05/17 1212 11/06/17 0636  WBC 13.2* 9.3  HGB 13.1 12.6*  HCT 42.1 39.7  MCV 90.1 87.6  PLT 157 115*   Basic Metabolic Panel: Recent Labs  Lab 11/05/17 1212 11/06/17 0636  NA 139 139  K 4.1 4.0  CL 99 101  CO2 28 28  GLUCOSE 215* 161*  BUN 38* 37*  CREATININE 1.54* 1.46*  CALCIUM 8.9 8.8*  MG  --  1.9   GFR: Estimated Creatinine Clearance: 45.1 mL/min (A) (by C-G formula based on SCr of 1.46 mg/dL (H)). Liver Function Tests: No results for input(s): AST, ALT, ALKPHOS, BILITOT, PROT, ALBUMIN in the last 168 hours. No results for input(s): LIPASE, AMYLASE in the last 168 hours. No results for input(s): AMMONIA in the last 168 hours. Coagulation Profile: Recent Labs  Lab 11/05/17 1310  INR 1.32   Cardiac Enzymes: Recent Labs  Lab 11/05/17 2103  TROPONINI 0.35*   BNP (last 3 results) No results for input(s): PROBNP in the last 8760 hours. HbA1C: Recent Labs    11/05/17 1858  HGBA1C 7.5*   CBG: Recent Labs  Lab 11/05/17 2313 11/06/17 0103 11/06/17 0816 11/06/17 1203  GLUCAP 249* 202* 156* 167*   Lipid Profile: No results for input(s): CHOL, HDL, LDLCALC, TRIG, CHOLHDL, LDLDIRECT in the last 72 hours. Thyroid Function Tests: No results for input(s): TSH, T4TOTAL, FREET4, T3FREE,  THYROIDAB in the last 72 hours. Anemia Panel: No results for input(s): VITAMINB12, FOLATE, FERRITIN, TIBC, IRON, RETICCTPCT in the last 72 hours. Sepsis Labs: No results for input(s): PROCALCITON, LATICACIDVEN in the last 168 hours.  Recent Results (from the past 240 hour(s))  Culture, Urine     Status: Abnormal   Collection Time: 11/05/17  4:24 PM  Result Value Ref Range Status   Specimen Description URINE, CLEAN CATCH  Final   Special Requests NONE  Final   Culture (A)  Final    <10,000 COLONIES/mL INSIGNIFICANT GROWTH Performed at [facility], [address], [place], [place] [address]    Report Status 11/06/2017 FINAL  Final  MRSA PCR Screening     Status: None   Collection Time: 11/05/17  9:52 PM  Result Value Ref Range Status   MRSA by PCR NEGATIVE NEGATIVE Final    Comment:        The GeneXpert MRSA Assay (FDA approved for NASAL  specimens only), is one component of a comprehensive MRSA colonization surveillance program. It is not intended to diagnose MRSA infection nor to guide or monitor treatment for MRSA infections. Performed at [facility], [place] [address], [place],  [address]          Radiology Studies: Dg Chest 2 View  Result Date: 11/05/2017 CLINICAL DATA:  Mid chest tightness and shortness of breath for 2 days. EXAM: CHEST - 2 VIEW COMPARISON:  Single-view of the chest 09/15/2017. PA and lateral chest 07/10/2017. CT chest 03/20/2017. FINDINGS: There is cardiomegaly without edema. Small bilateral pleural effusions are seen on all the comparison examinations and have slightly increased since the most recent study. There is associated basilar atelectasis. Aortic atherosclerosis is noted. No acute or focal bony abnormality. IMPRESSION: Chronic small bilateral pleural effusions have increased somewhat since the most recent examination. Cardiomegaly without edema. Atherosclerosis. Electronically Signed   By: Inge Rise M.D.   On: 11/05/2017  12:35        Scheduled Meds: . apixaban  5 mg Oral BID  . dronabinol  2.5 mg Oral BID AC  . famciclovir  250 mg Oral Daily  . furosemide  80 mg Intravenous BID  . gabapentin  800 mg Oral QHS  . insulin aspart  0-15 Units Subcutaneous TID WC  . insulin aspart  0-5 Units Subcutaneous QHS  . insulin aspart  4 Units Subcutaneous TID WC  . LORazepam  1 mg Oral Once  . magnesium oxide  400 mg Oral Daily  . Melatonin  6 mg Oral QHS  . pantoprazole  40 mg Oral Daily  . potassium chloride SA  20 mEq Oral Daily  . rosuvastatin  10 mg Oral Daily  . sodium chloride flush  3 mL Intravenous Q12H  . traZODone  50 mg Oral QHS   Continuous Infusions: . sodium chloride    . amiodarone 30 mg/hr (11/06/17 1100)     LOS: 1 day    Time spent: More than 50% of that time was spent in counseling and/or coordination of care.      Shelly Coss, MD [facility] Pager [phone]  If 7PM-7AM, please contact night-coverage [email or web address] Password [email or web address] 11/06/2017, 2:06 PM

## 2017-11-06 NOTE — Consult Note (Signed)
Referral MD  Reason for Referral: Kappa light chain myeloma; atrial fibrillation; transthyretin amyloidosis  Chief Complaint  Patient presents with  . Chest Pain  . Shortness of Breath  : My heart rate was fast.  HPI: Dillon Andrews is a very nice 77 year old white male.  He was recent diagnosed with transthyretin amyloidosis.  He also was found to have kappa light chain myeloma.  We have not found amyloidosis from the light chain myeloma however.  We did start him on treatment with Velcade and Revlimid.  He has had one dose of Velcade and only 1 day of Revlimid.  At home, he began to feel worse a couple days ago.  He noted that his heart rate was high.  The following was taken to the emergency room.  He was found to be in atrial fibrillation.  He was seen by cardiology.  He was admitted.  He was started on amiodarone.  He was also started on a heparin infusion.  Apparently, he cannot afford the medicine for the transthyretin amyloidosis.  The co-pay would be about $1000.  His labs yesterday show a white cell count 13.2.  Hemoglobin 13.1.  Platelet count 157,000.  His electrolytes look okay.  He does have diabetes with a blood sugar of 215.  His creatinine was 1.54.  He still has atrial fibrillation with a rapid ventricular response.  There is been no fever.  He has had no leading.  He has had no diarrhea.  There is been some occasional leg swelling.  He has been seen by Dr. Quay Burow of cardiology.  As always, he provides very insightful recommendations.  His appetite is marginal.  Overall, his performance status is ECOG 2.     Past Medical History:  Diagnosis Date  . Anemia 11/2016  . Arthritis    "some; hands/fingers" (06/25/2017)  . Atrial tachycardia ([place])   . Barrett's esophagus   . Basal cell carcinoma    "face, arms, hands" (06/25/2017)  . Chicken pox   . Cholelithiasis 11/2016   S/P LAP CHOLE  . Chronic combined systolic and diastolic CHF (congestive heart  failure) ([place])   . CKD (chronic kidney disease), stage III ([place])   . Esophageal varices (HCC)    Secondary to NAFLD  . Family history of adverse reaction to anesthesia    "daughter's sat drop very low; last OR had to be reintubated" (06/25/2017)  . GERD (gastroesophageal reflux disease)   . Goals of care, counseling/discussion 10/22/2017  . High cholesterol   . Hypertension   . Kappa light chain myeloma ([place]) 10/22/2017  . Mitral regurgitation   . NAFLD (nonalcoholic fatty liver disease)   . NICM (nonischemic cardiomyopathy) ([place])    Echo 06/2017: Severe LVH.  EF 25 to 30% with mild HK of the baseline after septal wall.  Moderate to severely dilated right atrium, moderately dilated RV. (Concern fr ? amyloidosis).  . NSVT (nonsustained ventricular tachycardia) ([place])   . Pneumonia 09/2016  . RBBB   . Tricuspid regurgitation   . Type II diabetes mellitus ([place])   :  Past Surgical History:  Procedure Laterality Date  . BACK SURGERY    . BASAL CELL CARCINOMA EXCISION Right    temple; "had to do skin graft"  . CARDIAC CATHETERIZATION  09/2016   Minimal, non-occlusive CAD, EF 45% [facility])  . CARPAL TUNNEL RELEASE Bilateral   . CATARACT EXTRACTION W/ INTRAOCULAR LENS  IMPLANT, BILATERAL Bilateral   . CHOLECYSTECTOMY N/A 11/22/2016  Procedure: LAPAROSCOPIC CHOLECYSTECTOMY;  Surgeon: Coralie Keens, MD;  Location: [facility];  Service: General;  Laterality: N/A;  . JOINT REPLACEMENT    . LUMBAR DISC SURGERY  X 2  . TONSILLECTOMY AND ADENOIDECTOMY    . TOTAL KNEE ARTHROPLASTY Left 2010  . TRANSESOPHAGEAL ECHOCARDIOGRAM  09/2016   Mild global LV hypokinesis. Moderate MR. Moderate TR. [facility])  . TRANSTHORACIC ECHOCARDIOGRAM  10/2016   [facility]) EF 35-40% with moderate HK. Normal RV function. Moderately severe TR.  Marland Kitchen TRANSTHORACIC ECHOCARDIOGRAM  03/2017; 06/2017    A) ([place]) mild concentric LVH. EF of 40-45%. Diffuse HK with no RWMA. GR 2 DD. Septal  dyssynergy. Mild MR, moderate TR. Severe RA dilation..;; B) Severe LVH.  EF 25 to 30% with mild HK of Septal wall.  Mod-severely dilated RA & Mod dilated RV.  :   Current Facility-Administered Medications:  .  0.9 %  sodium chloride infusion, 250 mL, Intravenous, PRN, Paticia Stack, MD .  acetaminophen (TYLENOL) tablet 650 mg, 650 mg, Oral, Q4H PRN, Paticia Stack, MD .  Margrett Rud amiodarone (NEXTERONE) 1.8 mg/mL load via infusion 150 mg, 150 mg, Intravenous, Once, 150 mg at 11/05/17 1733 **FOLLOWED BY** [EXPIRED] amiodarone (NEXTERONE PREMIX) 360-4.14 MG/200ML-% (1.8 mg/mL) IV infusion, 60 mg/hr, Intravenous, Continuous, Stopped at 11/05/17 2249 **FOLLOWED BY** amiodarone (NEXTERONE PREMIX) 360-4.14 MG/200ML-% (1.8 mg/mL) IV infusion, 30 mg/hr, Intravenous, Continuous, Paticia Stack, MD, Last Rate: 16.7 mL/hr at 11/06/17 0614, 30 mg/hr at 11/06/17 0614 .  aspirin EC tablet 81 mg, 81 mg, Oral, Daily, Paticia Stack, MD .  dronabinol (MARINOL) capsule 2.5 mg, 2.5 mg, Oral, BID AC, Paticia Stack, MD .  famciclovir [facility]) tablet 250 mg, 250 mg, Oral, Daily, Paticia Stack, MD .  furosemide (LASIX) injection 40 mg, 40 mg, Intravenous, BID, Paticia Stack, MD, 40 mg at 11/05/17 2259 .  gabapentin (NEURONTIN) capsule 800 mg, 800 mg, Oral, QHS, Paticia Stack, MD, 800 mg at 11/05/17 2259 .  heparin ADULT infusion 100 units/mL (25000 units/225m sodium chloride 0.45%), 1,200 Units/hr, Intravenous, Continuous, SPaticia Stack MD, Last Rate: 12 mL/hr at 11/06/17 0616, 1,200 Units/hr at 11/06/17 0616 .  insulin aspart (novoLOG) injection 0-15 Units, 0-15 Units, Subcutaneous, TID WC, SPaticia Stack MD .  insulin aspart (novoLOG) injection 0-5 Units, 0-5 Units, Subcutaneous, QHS, SPaticia Stack MD, Stopped at 11/06/17 0012 .  insulin aspart (novoLOG) injection 4 Units, 4 Units, Subcutaneous, TID WC, SPaticia Stack MD .  LORazepam (ATIVAN) tablet 1 mg, 1 mg, Oral, Once, SPaticia Stack MD .  magnesium oxide  (MAG-OX) tablet 400 mg, 400 mg, Oral, Daily, SPaticia Stack MD .  Melatonin TABS 6 mg, 6 mg, Oral, QHS, SPaticia Stack MD, 6 mg at 11/05/17 2300 .  ondansetron (ZOFRAN) injection 4 mg, 4 mg, Intravenous, Q6H PRN, SPaticia Stack MD .  pantoprazole (PROTONIX) EC tablet 40 mg, 40 mg, Oral, Daily, SPaticia Stack MD, 40 mg at 11/05/17 2300 .  potassium chloride SA (K-DUR,KLOR-CON) CR tablet 20 mEq, 20 mEq, Oral, Daily, SPaticia Stack MD, 20 mEq at 11/05/17 2259 .  rosuvastatin (CRESTOR) tablet 10 mg, 10 mg, Oral, Daily, SPaticia Stack MD, 10 mg at 11/05/17 2300 .  sodium chloride flush (NS) 0.9 % injection 3 mL, 3 mL, Intravenous, Q12H, SPaticia Stack MD, 3 mL at 11/05/17 2248 .  sodium chloride flush (NS) 0.9 % injection 3 mL, 3 mL, Intravenous, PRN,  Paticia Stack, MD .  traZODone (DESYREL) tablet 50 mg, 50 mg, Oral, QHS, Paticia Stack, MD, 50 mg at 11/05/17 2300 .  zolpidem (AMBIEN) tablet 5 mg, 5 mg, Oral, QHS PRN, Gardiner Barefoot, NP, 5 mg at 11/05/17 2300:  . aspirin EC  81 mg Oral Daily  . dronabinol  2.5 mg Oral BID AC  . famciclovir  250 mg Oral Daily  . furosemide  40 mg Intravenous BID  . gabapentin  800 mg Oral QHS  . insulin aspart  0-15 Units Subcutaneous TID WC  . insulin aspart  0-5 Units Subcutaneous QHS  . insulin aspart  4 Units Subcutaneous TID WC  . LORazepam  1 mg Oral Once  . magnesium oxide  400 mg Oral Daily  . Melatonin  6 mg Oral QHS  . pantoprazole  40 mg Oral Daily  . potassium chloride SA  20 mEq Oral Daily  . rosuvastatin  10 mg Oral Daily  . sodium chloride flush  3 mL Intravenous Q12H  . traZODone  50 mg Oral QHS  :  No Known Allergies:  Family History  Problem Relation Age of Onset  . Heart failure Mother   . Cancer Father   . Cataracts Father   . Breast cancer Sister   . Alcohol abuse Brother   . Liver disease Brother   . Autoimmune disease Daughter        Bechet's syndrome  . Cirrhosis Neg Hx   :  Social History   Socioeconomic History   . Marital status: Married    Spouse name: Not on file  . Number of children: 4  . Years of education: 72  . Highest education level: Not on file  Occupational History  . Occupation: retired  Scientific laboratory technician  . Financial resource strain: Not on file  . Food insecurity:    Worry: Not on file    Inability: Not on file  . Transportation needs:    Medical: Not on file    Non-medical: Not on file  Tobacco Use  . Smoking status: Former Smoker    Packs/day: 0.30    Years: 15.00    Pack years: 4.50    Types: Cigarettes    Last attempt to quit: 03/17/1977    Years since quitting: 40.6  . Smokeless tobacco: Never Used  Substance and Sexual Activity  . Alcohol use: Yes    Comment: 06/25/2017 "nothing since 09/2016;  used to have glass of wine q hs"  . Drug use: No  . Sexual activity: Not on file  Lifestyle  . Physical activity:    Days per week: Not on file    Minutes per session: Not on file  . Stress: Not on file  Relationships  . Social connections:    Talks on phone: Not on file    Gets together: Not on file    Attends religious service: Not on file    Active member of club or organization: Not on file    Attends meetings of clubs or organizations: Not on file    Relationship status: Not on file  . Intimate partner violence:    Fear of current or ex partner: Not on file    Emotionally abused: Not on file    Physically abused: Not on file    Forced sexual activity: Not on file  Other Topics Concern  . Not on file  Social History Narrative   He is retired from [employer].  Previously  worked for [employer] by his daughter --> also a patient of Dr. Ellyn Hack   Lives with wife in a one story home.    Education: high school.   :    Exam: See above Patient Vitals for the past 24 hrs:  BP Temp Temp src Pulse Resp SpO2 Height Weight  11/06/17 0500 - - - - - - - 187 lb 9.8 oz (85.1 kg)  11/06/17 0449 (!) 83/71 98.3 F (36.8 C) Oral 99 15 94 % - -   11/05/17 2356 117/77 98 F (36.7 C) Oral 74 15 93 % - -  11/05/17 2318 - 97.6 F (36.4 C) Oral - - - - -  11/05/17 2200 - - - 97 20 97 % - -  11/05/17 2115 97/79 - - 64 (!) 28 91 % - -  11/05/17 2100 93/72 - - (!) 54 (!) 29 95 % - -  11/05/17 2045 (!) 88/76 - - 71 16 95 % - -  11/05/17 2030 (!) 87/79 - - (!) 55 (!) 22 94 % - -  11/05/17 2015 (!) 89/69 - - (!) 45 (!) 28 93 % - -  11/05/17 1915 92/77 - - (!) 40 (!) 22 92 % - -  11/05/17 1830 (!) 88/72 - - 100 (!) 23 95 % - -  11/05/17 1815 (!) 82/54 - - (!) 116 (!) 23 96 % - -  11/05/17 1800 91/76 - - 75 18 91 % - -  11/05/17 1730 93/77 - - 62 (!) 27 95 % - -  11/05/17 1715 90/71 - - (!) 121 (!) 24 95 % - -  11/05/17 1700 92/77 - - (!) 119 (!) 26 94 % - -  11/05/17 1645 (!) 81/68 - - 61 (!) 26 92 % - -  11/05/17 1630 90/69 - - (!) 113 (!) 23 93 % - -  11/05/17 1600 95/75 - - (!) 120 19 95 % - -  11/05/17 1545 91/68 - - (!) 54 (!) 24 95 % - -  11/05/17 1530 95/62 - - (!) 116 17 94 % - -  11/05/17 1515 100/71 - - (!) 106 14 96 % - -  11/05/17 1500 101/84 - - (!) 112 19 96 % - -  11/05/17 1445 99/75 - - (!) 112 (!) 21 95 % - -  11/05/17 1430 (!) 82/69 - - (!) 50 (!) 22 94 % - -  11/05/17 1415 96/67 - - (!) 113 19 95 % - -  11/05/17 1400 (!) 84/74 - - (!) 56 (!) 26 94 % 5' 9"  (1.753 m) 185 lb 6.5 oz (84.1 kg)  11/05/17 1345 (!) 89/65 - - (!) 109 (!) 28 94 % - -  11/05/17 1330 (!) 91/59 - - (!) 118 (!) 27 94 % - -  11/05/17 1315 (!) 89/61 - - (!) 44 (!) 25 95 % - -  11/05/17 1300 (!) 86/77 - - (!) 117 (!) 22 95 % - -  11/05/17 1246 (!) 85/68 - - (!) 120 20 95 % - -  11/05/17 1234 92/71 - - (!) 114 (!) 21 96 % - -  11/05/17 1156 93/74 98.2 F (36.8 C) Oral (!) 115 20 100 % - -  11/05/17 1153 - - - - - - - 185 lb 6.4 oz (84.1 kg)     Recent Labs    11/05/17 1212  WBC 13.2*  HGB 13.1  HCT 42.1  PLT  157   Recent Labs    11/05/17 1212  NA 139  K 4.1  CL 99  CO2 28  GLUCOSE 215*  BUN 38*  CREATININE 1.54*  CALCIUM 8.9     Blood smear review: None  Pathology: None    Assessment and Plan: Dillon Andrews is a 77 year old white male with amyloidosis.  This appears to be related to transthyretin.  It does not appear to be obviously related to the light chain myeloma.  However, this certainly could be a factor.  I am not had any patient on Velcade or Revlimid run into problems with cardiac arrhythmias.  As such, I think the atrial fibrillation is probably more from the heart failure than anything else.  He was on aspirin for his Revlimid.  We will had to make sure this is stopped.  Anything that cardiology feels will help his cardiac status can be done from a hematologic point of view.  We will keep him off the Revlimid for right now.  I will the he needs this and I want to make sure that we do not potentially complicate the situation.  I appreciate everybody's help up on 6 E.  I know that Dillon Andrews will get incredible care.  Lattie Haw, MD  Psalm 34:4

## 2017-11-07 ENCOUNTER — Encounter (HOSPITAL_COMMUNITY): Payer: Medicare HMO

## 2017-11-07 ENCOUNTER — Encounter: Payer: Self-pay | Admitting: Hematology & Oncology

## 2017-11-07 ENCOUNTER — Inpatient Hospital Stay: Payer: Self-pay

## 2017-11-07 DIAGNOSIS — Z515 Encounter for palliative care: Secondary | ICD-10-CM

## 2017-11-07 DIAGNOSIS — Z7189 Other specified counseling: Secondary | ICD-10-CM

## 2017-11-07 LAB — BASIC METABOLIC PANEL
ANION GAP: 14 (ref 5–15)
BUN: 42 mg/dL — ABNORMAL HIGH (ref 8–23)
CALCIUM: 9 mg/dL (ref 8.9–10.3)
CO2: 30 mmol/L (ref 22–32)
CREATININE: 1.63 mg/dL — AB (ref 0.61–1.24)
Chloride: 97 mmol/L — ABNORMAL LOW (ref 98–111)
GFR calc Af Amer: 45 mL/min — ABNORMAL LOW (ref >60)
GFR, EST NON AFRICAN AMERICAN: 39 mL/min — AB (ref >60)
Glucose, Bld: 142 mg/dL — ABNORMAL HIGH (ref 70–99)
Potassium: 3.6 mmol/L (ref 3.5–5.1)
Sodium: 141 mmol/L (ref 135–145)

## 2017-11-07 LAB — CBC
HEMATOCRIT: 41.6 % (ref 39.0–52.0)
Hemoglobin: 13.1 g/dL (ref 13.0–17.0)
MCH: 28.1 pg (ref 26.0–34.0)
MCHC: 31.5 g/dL (ref 30.0–36.0)
MCV: 89.3 fL (ref 78.0–100.0)
PLATELETS: 141 10*3/uL — AB (ref 150–400)
RBC: 4.66 MIL/uL (ref 4.22–5.81)
RDW: 18.6 % — AB (ref 11.5–15.5)
WBC: 10.8 10*3/uL — AB (ref 4.0–10.5)

## 2017-11-07 LAB — GLUCOSE, CAPILLARY
GLUCOSE-CAPILLARY: 134 mg/dL — AB (ref 70–99)
GLUCOSE-CAPILLARY: 130 mg/dL — AB (ref 70–99)
Glucose-Capillary: 201 mg/dL — ABNORMAL HIGH (ref 70–99)
Glucose-Capillary: 133 mg/dL — ABNORMAL HIGH (ref 70–99)

## 2017-11-07 LAB — MAGNESIUM: Magnesium: 2 mg/dL (ref 1.7–2.4)

## 2017-11-07 LAB — COOXEMETRY PANEL
CARBOXYHEMOGLOBIN: 1.3 % (ref 0.5–1.5)
Methemoglobin: 0.9 % (ref 0.0–1.5)
O2 Saturation: 58 %
Total hemoglobin: 12.3 g/dL (ref 12.0–16.0)

## 2017-11-07 MED ORDER — SODIUM CHLORIDE 0.9% FLUSH
10.0000 mL | Freq: Two times a day (BID) | INTRAVENOUS | Status: DC
  Administered 2017-11-07 (×2): 10 mL
  Administered 2017-11-08 (×2): 20 mL
  Administered 2017-11-11 – 2017-11-13 (×5): 10 mL
  Administered 2017-11-14: 40 mL
  Administered 2017-11-15 – 2017-11-19 (×8): 10 mL

## 2017-11-07 MED ORDER — SODIUM CHLORIDE 0.9% FLUSH
10.0000 mL | INTRAVENOUS | Status: DC | PRN
  Administered 2017-11-20: 10 mL
  Filled 2017-11-07: qty 40

## 2017-11-07 MED ORDER — FUROSEMIDE 10 MG/ML IJ SOLN
80.0000 mg | Freq: Two times a day (BID) | INTRAMUSCULAR | Status: DC
  Administered 2017-11-07 – 2017-11-10 (×7): 80 mg via INTRAVENOUS
  Filled 2017-11-07 (×7): qty 8

## 2017-11-07 NOTE — Consult Note (Signed)
Consultation Note Date: 11/07/2017   Patient Name: Dillon Andrews  DOB: [birth year]  MRN: [id number]  Age / Sex: 77 y.o., male  PCP: [name], MD Referring Physician: Shelly Coss, MD  Reason for Consultation: Establishing goals of care and Psychosocial/spiritual support  HPI/Patient Profile: 77 y.o. male  with past medical history of cardiac amyloidosis diagnosed approximately March 2019, cirrhosis, history of esophageal varices, pleural effusion, combined heart failure with an EF 25 to 30%, cardiomyopathy, right bundle branch block, chronic kidney disease stage III, myeloma with bone marrow biopsy June 2019 admitted on 11/05/2017 with shortness of breath, lethargy, lack of appetite.  Patient is currently on chemotherapy  for myeloma and his last treatment was 1 week prior to admission.  In addition to this new diagnosis of myeloma, he has also been diagnosed in March or April of this year with cardiac amyloidosis.  Consult ordered for goals of care in the setting of new A. fib with combined heart failure, cardiac amyloidosis, myeloma  Clinical Assessment and Goals of Care: Patient seen, chart reviewed. Dtr Dillon Andrews is at the bedside. Patient is alert and oriented x3.  He is endorsing a lot of stress related to his medical diagnoses this year specifically cardiac amyloidosis as well as new diagnosis of multiple myeloma.  States "I was never sick for 77 years".In terms of his cardiac amyloidosis, he states he has been unable to afford the co-pay for a new medication that has come out that potentially could delay the progression of this disease.  He is followed by Dr. Marin Olp for myeloma and is currently undergoing chemotherapy  palliative .  Mr. okechukwu regnier confidence in Dr. Jeffie Pollock and Dr. Martha Clan to help hi in his care  Since being admitted to the hospital patient is now in new A. fib. Rate  is improving on amiodarone gtt. Poor candidate for TEE given h/o esophageal varices.  When asked about advanced directives he states "my wife and I need to do that".  He is not familiar with the terms of full code versus DNR and we began that discussion.  After discussion,  he is a full code by choice. His daughter is supportive of that decision. He would not want to pursue trach or prolonged life support if he could not get better.  In the event the patient were unable to speak for himself, his healthcare power of attorney would be his wife, Dillon Andrews.  Patient also has 4 children who all live locally.  He states his daughters, Dillon Andrews and Dillon Andrews, are particularly active in supporting he and his wife. They are making decisions as a family  We engaged in brief life review. 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  SUMMARY OF RECOMMENDATIONS   Full code Pt willing to pursue cardioversion on Mon. Will place consult to spiritual care to assist in advanced directives while in the hospital Is hopeful to  continue treatment for myeloma with Dr. Marin Olp Would benefit from ongoing [place] either thru oncology department (Palliative medicine provider in onc clinic on Monday's) or in the community. Community services can be accessed thru [facility] at [phone] or [facility] palliative medicine division at [phone]   Code Status/Advance Care Planning:  Full code   Palliative Prophylaxis:   Bowel Regimen, Eye Care, Frequent Pain Assessment and Turn Reposition  Additional Recommendations (Limitations, Scope, Preferences):  Full Scope Treatment  Psycho-social/Spiritual:   Desire for further Chaplaincy support:no  Additional Recommendations: Referral to Community Resources   Prognosis:   < 6 months potentially in the setting of new  cardiac amyloidosis, heart failure with EF of 25 to 30%, multiple myeloma, new A. fib with RVR.  Likely would qualify for hospice but at this point, hospice is not in keeping with his goals  Discharge Planning: To Be Determined      Primary Diagnoses: Present on Admission: . Atrial fibrillation with RVR ([place]), new onset . SOB (shortness of breath) . CKD (chronic kidney disease) stage 3, GFR 30-59 ml/min (HCC), baseline Cr 1.5 . Acute on chronic combined systolic and diastolic CHF (congestive heart failure) ([place]) . Esophageal varices in cirrhosis (HCC) . Other secondary pulmonary hypertension ([place]) . RBBB . Cardiac amyloidosis ([place]) . Kappa light chain myeloma ([place]) . Leukocytosis . Bilateral pleural effusion . Elevated troponin . Elevated brain natriuretic peptide (BNP) level, acute on chronic . Hypotension   I have reviewed the medical record, interviewed the patient and family, and examined the patient. The following aspects are pertinent.  Past Medical History:  Diagnosis Date  . Anemia 11/2016  . Arthritis    "some; hands/fingers" (06/25/2017)  . Atrial tachycardia ([place])   . Barrett's esophagus   . Basal cell carcinoma    "face, arms, hands" (06/25/2017)  . Chicken pox   . Cholelithiasis 11/2016   S/P LAP CHOLE  . Chronic combined systolic and diastolic CHF (congestive heart failure) ([place])   . CKD (chronic kidney disease), stage III ([place])   . Esophageal varices (HCC)    Secondary to NAFLD  . Family history of adverse reaction to anesthesia    "daughter's sat drop very low; last OR had to be reintubated" (06/25/2017)  . GERD (gastroesophageal reflux disease)   . Goals of care, counseling/discussion 10/22/2017  . High cholesterol   . Hypertension   . Kappa light chain myeloma ([place]) 10/22/2017  . Mitral regurgitation   . NAFLD (nonalcoholic fatty liver disease)   . NICM (nonischemic cardiomyopathy) ([place])    Echo 06/2017: Severe LVH.  EF 25 to 30% with mild HK of the  baseline after septal wall.  Moderate to severely dilated right atrium, moderately dilated RV. (Concern fr ? amyloidosis).  . NSVT (nonsustained ventricular tachycardia) ([place])   . Pneumonia 09/2016  . RBBB   . Tricuspid regurgitation   . Type II diabetes mellitus ([place])    Social History   Socioeconomic History  . Marital status: Married    Spouse name: Not on file  . Number of children: 4  . Years of education: 6  . Highest education level: Not on file  Occupational History  . Occupation: retired  Scientific laboratory technician  . Financial resource strain: Not on file  . Food insecurity:    Worry: Not on file    Inability: Not on file  . Transportation needs:    Medical: Not on file    Non-medical: Not on file  Tobacco Use  . Smoking status: Former Smoker    Packs/day: 0.30    Years: 15.00    Pack years: 4.50    Types: Cigarettes    Last attempt to quit: 03/17/1977    Years since quitting: 40.6  . Smokeless tobacco: Never Used  Substance and Sexual Activity  . Alcohol use: Yes    Comment: 06/25/2017 "nothing since 09/2016;  used to have glass of wine q hs"  . Drug use: No  . Sexual activity: Not on file  Lifestyle  . Physical activity:    Days per week: Not on file    Minutes per session: Not on file  . Stress: Not on file  Relationships  . Social connections:    Talks on phone: Not on file    Gets together: Not on file    Attends religious service: Not on file    Active member of club or organization: Not on file    Attends meetings of clubs or organizations: Not on file    Relationship status: Not on file  Other Topics Concern  . Not on file  Social History Narrative   He is retired from [employer].  Previously worked for [employer] by his daughter --> also a patient of Dr. Ellyn Hack   Lives with wife in a one story home.    Education: high school.    Family History  Problem Relation Age of Onset  . Heart failure Mother   . Cancer Father   .  Cataracts Father   . Breast cancer Sister   . Alcohol abuse Brother   . Liver disease Brother   . Autoimmune disease Daughter        Bechet's syndrome  . Cirrhosis Neg Hx    Scheduled Meds: . apixaban  5 mg Oral BID  . dronabinol  2.5 mg Oral BID AC  . famciclovir  250 mg Oral Daily  . gabapentin  800 mg Oral QHS  . insulin aspart  0-15 Units Subcutaneous TID WC  . insulin aspart  0-5 Units Subcutaneous QHS  . insulin aspart  4 Units Subcutaneous TID WC  . LORazepam  1 mg Oral Once  . magnesium oxide  400 mg Oral Daily  . Melatonin  6 mg Oral QHS  . pantoprazole  40 mg Oral Daily  . potassium chloride SA  20 mEq Oral Daily  . rosuvastatin  10 mg Oral Daily  . sodium chloride flush  10-40 mL Intracatheter Q12H  . sodium chloride flush  3 mL Intravenous Q12H  . traZODone  50 mg Oral QHS   Continuous Infusions: . sodium chloride    . amiodarone 30 mg/hr (11/07/17 9417)   PRN Meds:.sodium chloride, acetaminophen, LORazepam, ondansetron (ZOFRAN) IV, sodium chloride flush, sodium chloride flush, zolpidem Medications Prior to Admission:  Prior to Admission medications   Medication Sig Start Date End Date Taking? Authorizing Provider  aspirin EC 81 MG tablet Take 81 mg by mouth daily. 12/23/14  Yes [provider]  carvedilol (COREG) 6.25 MG tablet TAKE 1 TABLET BY MOUTH TWICE A DAY WITH MEALS 10/28/17  Yes Leonie Man, MD  dexamethasone (DECADRON) 4 MG tablet Take 5 tablets (20 mg total) by mouth once a week. 10/30/17  Yes Volanda Napoleon, MD  dronabinol (MARINOL) 2.5 MG capsule Take 1 capsule (2.5 mg total) by mouth 2 (two) times daily before lunch and supper. 10/22/17  Yes Volanda Napoleon, MD  famciclovir [facility]) 250  MG tablet Take 1 tablet (250 mg total) by mouth daily. 10/22/17  Yes Volanda Napoleon, MD  gabapentin (NEURONTIN) 800 MG tablet TAKE 1 TABLET BY MOUTH AT BEDTIME 09/23/17  Yes [name], MD  lenalidomide (REVLIMID) 20 MG capsule Take 1 capsule  (20 mg total) by mouth daily. Take 1 capsule at bedtime for 21 days on and 7 days off. Swallow whole.  Do not Crush. 10/22/17  Yes Volanda Napoleon, MD  magnesium oxide (MAG-OX) 400 MG tablet Take 400 mg by mouth daily.   Yes [provider]  Melatonin 5 MG TABS Take 5 mg by mouth at bedtime.    Yes [provider]  omeprazole (PRILOSEC) 40 MG capsule Take 1 capsule (40 mg total) by mouth daily. 07/31/17  Yes [name], MD  ondansetron (ZOFRAN) 4 MG tablet Take 1 tablet (4 mg total) by mouth every 8 (eight) hours as needed for nausea or vomiting. 10/08/17  Yes [name], MD  potassium chloride SA (K-DUR,KLOR-CON) 20 MEQ tablet TAKE 1 TO 2 TABLET DAILY AS DIRECTED 08/11/17  Yes Leonie Man, MD  Probiotic Product (PROBIOTIC DAILY PO) Take 1 tablet by mouth.   Yes [provider]  rosuvastatin (CRESTOR) 10 MG tablet TAKE 1 TABLET BY MOUTH ONCE DAILY 10/29/17  Yes [name], MD  torsemide (DEMADEX) 20 MG tablet Take 20 mg by mouth 2 (two) times daily.    Yes [provider]  traZODone (DESYREL) 50 MG tablet TAKE 1 TABLET(50 MG) BY MOUTH AT BEDTIME 10/29/17  Yes [name], MD  vitamin B-12 (CYANOCOBALAMIN) 500 MCG tablet Take 500 mcg by mouth every other day.   Yes [provider]  zolpidem (AMBIEN) 5 MG tablet TAKE 1 TABLET BY MOUTH EVERY NIGHT AT BEDTIME AS NEEDED FOR SLEEP 10/23/17  Yes [name], MD  glucose blood test strip Use as instructed 03/05/17   [name], MD  VYNDAQEL 20 MG CAPS Take 20 mg by mouth daily. 11/03/17   [provider]   No Known Allergies Review of Systems  Constitutional: Positive for activity change, appetite change and fever.  HENT: Negative.   Eyes: Negative.   Respiratory: Positive for cough, chest tightness and shortness of breath.   Cardiovascular: Positive for chest pain and palpitations.  Gastrointestinal: Negative.   Endocrine:  Negative.   Genitourinary: Negative.   Musculoskeletal: Negative.   Skin: Negative.   Allergic/Immunologic: Negative.   Neurological: Positive for dizziness and weakness.  Hematological: Negative.   Psychiatric/Behavioral: Positive for decreased concentration.    Physical Exam  Constitutional: He is oriented to person, place, and time. He appears well-developed and well-nourished.  HENT:  Head: Normocephalic and atraumatic.  Neck: Normal range of motion.  Cardiovascular:  Recurrent A. fib  Pulmonary/Chest:  Dyspnea upon exertion No increased work of breathing at rest  Abdominal: Soft.  Musculoskeletal: Normal range of motion.  Neurological: He is alert and oriented to person, place, and time.  Skin: Skin is warm and dry.  Psychiatric: He has a normal mood and affect. His behavior is normal. Judgment and thought content normal.  Nursing note and vitals reviewed.   Vital Signs: BP (!) 85/74 (BP Location: Left Arm)   Pulse (!) 114   Temp 97.7 F (36.5 C) (Oral)   Resp 18   Ht 5' 9"  (1.753 m)   Wt 82.9 kg (182 lb 12.8 oz)   SpO2 95%   BMI 26.99 kg/m  Pain  Scale: 0-10   Pain Score: 0-No pain   SpO2: SpO2: 95 % O2 Device:SpO2: 95 % O2 Flow Rate: .O2 Flow Rate (L/min): 2 L/min  IO: Intake/output summary:   Intake/Output Summary (Last 24 hours) at 11/07/2017 1130 Last data filed at 11/07/2017 1601 Gross per 24 hour  Intake 436.52 ml  Output 2555 ml  Net -2118.48 ml    LBM: Last BM Date: 11/05/17 Baseline Weight: Weight: 84.1 kg (185 lb 6.4 oz) Most recent weight: Weight: 82.9 kg (182 lb 12.8 oz)     Palliative Assessment/Data:   Flowsheet Rows     Most Recent Value  Intake Tab  Referral Department  Hospitalist  Unit at Time of Referral  Med/Surg Unit  Palliative Care Primary Diagnosis  Cardiac  Date Notified  11/05/17  Palliative Care Type  New Palliative care  Reason for referral  Clarify Goals of Care  Date of Admission  11/05/17  Date first seen by  Palliative Care  11/07/17  # of days Palliative referral response time  2 Day(s)  # of days IP prior to Palliative referral  0  Clinical Assessment  Palliative Performance Scale Score  40%  Pain Max last 24 hours  Not able to report  Pain Min Last 24 hours  Not able to report  Dyspnea Max Last 24 Hours  Not able to report  Dyspnea Min Last 24 hours  Not able to report  Nausea Max Last 24 Hours  Not able to report  Nausea Min Last 24 Hours  Not able to report  Anxiety Max Last 24 Hours  Not able to report  Anxiety Min Last 24 Hours  Not able to report  Other Max Last 24 Hours  Not able to report  Psychosocial & Spiritual Assessment  Palliative Care Outcomes      Time In: 1620 Time Out: 1720 Time Total: 60 min Greater than 50%  of this time was spent counseling and coordinating care related to the above assessment and plan.  Signed by: Dory Horn, NP   Please contact Palliative Medicine Team phone at [phone] for questions and concerns.  For individual provider: See Shea Evans

## 2017-11-07 NOTE — Progress Notes (Signed)
PROGRESS NOTE    Dillon Andrews  EFE:[id number] DOB: [birth year] DOA: 11/05/2017 PCP: [name], MD   Brief Narrative: Patient is a 77 year old male with past medical history of liver cirrhosis with esophageal varices, combined systolic and diastolic CHF with ejection fraction of 25 to 30%, nonischemic cardiomyopathy, pulmonary hypertension, cardiac amyloidosis, CKD stage III, kappa light chain myeloma on chemotherapy was brought to the emergency department due to shortness of breath.  Patient was found to be in A. fib with RVR with hypotension on presentation.  Cardiology was consulted.  Started on amiodarone drip.  Also started on heparin for new onset A. fib.  Heart failure team is following now.Plan is to do DC cardioversion as per cardiology.  Assessment & Plan:   Principal Problem:   Atrial fibrillation with RVR ([place]), new onset Active Problems:   CKD (chronic kidney disease) stage 3, GFR 30-59 ml/min (HCC), baseline Cr 1.5   DM2 (diabetes mellitus, type 2) (HCC)   Acute on chronic combined systolic and diastolic CHF (congestive heart failure) (HCC)   Bilateral pleural effusion   Esophageal varices in cirrhosis (HCC)   SOB (shortness of breath)   Non-ischemic cardiomyopathy ([name])   Other secondary pulmonary hypertension (HCC)   RBBB   Cardiac amyloidosis (HCC)   Kappa light chain myeloma (HCC)   Leukocytosis   Elevated troponin   Elevated brain natriuretic peptide (BNP) level, acute on chronic   Hypotension  A. fib with RVR: Currently rate is improving.  On amiodarone drip.  Blood pressure soft but  stable.  Started on Eliquis.  He has a history of esophageal varices so is a poor candidate for TEE.  He might need anticoagulants for 3 to 4 weeks followed by DCCV.CHA2DS2/VASc is at least 6.Cardiology following.  CHF: Acute on chronic combined heart failure with amyloidosis.  Heart failure team is following.  Continue IV Lasix.  Echo on 3/19 was showing EF of 25 to  30%.  Thought to be associated with amyloidosis.  He was on  IV lasix but it has been held by cardiology for AKI.  We will continue to monitor his electrolytes.  He was on  Coreg and torsemide at home.  Not on ARB or Entresto due to CKD.  Cirrhosis with esophageal varices: Likely  NASH.  Follow-up with GI as an outpatient.  We will monitor him closely for bleeding especially with anticoagulation.  Kappa light chain myeloma: Follows with Dr. Marin Olp.  Just had chemotherapy  few days ago.  Was on bortezomib and Revlimid  CKD stage III: Baseline creatinine around 1.6.  Creatinine slightly bumped up today but is around baseline.  Diabetes mellitus type 2: We will continue monitor his blood sugars.  Continue current regimen.    DVT prophylaxis: Eliquis Code Status: Full Family Communication: Daughter present at the bedside Disposition Plan: To home in 1-2 days.  Waiting for stabilization of the heart rate.   Consultants: Heme-onc /Cardiology  Procedures:None  Antimicrobials:None  Subjective: Patient seen and examined the bedside this morning.  Remains comfortable.  Complains of mild shortness of breath but respiratory status has been stable.  Heart rate is stabilizing.  No overnight fever, nausea or vomiting.  Objective: Vitals:   11/07/17 0518 11/07/17 0839 11/07/17 0908 11/07/17 1122  BP: 104/75 (!) 85/74 100/71 109/81  Pulse:  (!) 114    Resp: 18 18    Temp: 97.6 F (36.4 C) 97.7 F (36.5 C)    TempSrc: Oral Oral    SpO2:  94% 95% 96% 93%  Weight: 82.9 kg (182 lb 12.8 oz)     Height:        Intake/Output Summary (Last 24 hours) at 11/07/2017 1443 Last data filed at 11/07/2017 1200 Gross per 24 hour  Intake 1013.04 ml  Output 2855 ml  Net -1841.96 ml   Filed Weights   11/05/17 1400 11/06/17 0500 11/07/17 0518  Weight: 84.1 kg (185 lb 6.5 oz) 85.1 kg (187 lb 9.8 oz) 82.9 kg (182 lb 12.8 oz)    Examination:  General exam: Not in distress,thin built, chronically ill  looking HEENT:PERRL,Oral mucosa moist, Ear/Nose normal on gross exam Respiratory system: Bilateral decreased air entry on the bases  cardiovascular system: A. fib with RVR. No JVD, murmurs, rubs, gallops or clicks. Gastrointestinal system: Abdomen is nondistended, soft and nontender. No organomegaly or masses felt. Normal bowel sounds heard. Central nervous system: Alert and oriented. No focal neurological deficits. Extremities: No edema, no clubbing ,no cyanosis, distal peripheral pulses palpable. Skin: No rashes, lesions or ulcers,no icterus ,no pallor MSK: Normal muscle bulk,tone ,power Psychiatry: Judgement and insight appear normal. Mood & affect appropriate.       Data Reviewed: I have personally reviewed following labs and imaging studies  CBC: Recent Labs  Lab 11/05/17 1212 11/06/17 0636 11/07/17 0505  WBC 13.2* 9.3 10.8*  HGB 13.1 12.6* 13.1  HCT 42.1 39.7 41.6  MCV 90.1 87.6 89.3  PLT 157 128* 364*   Basic Metabolic Panel: Recent Labs  Lab 11/05/17 1212 11/06/17 0636 11/07/17 0505  NA 139 139 141  K 4.1 4.0 3.6  CL 99 101 97*  CO2 28 28 30   GLUCOSE 215* 161* 142*  BUN 38* 37* 42*  CREATININE 1.54* 1.46* 1.63*  CALCIUM 8.9 8.8* 9.0  MG  --  1.9 2.0   GFR: Estimated Creatinine Clearance: 37.4 mL/min (A) (by C-G formula based on SCr of 1.63 mg/dL (H)). Liver Function Tests: No results for input(s): AST, ALT, ALKPHOS, BILITOT, PROT, ALBUMIN in the last 168 hours. No results for input(s): LIPASE, AMYLASE in the last 168 hours. No results for input(s): AMMONIA in the last 168 hours. Coagulation Profile: Recent Labs  Lab 11/05/17 1310  INR 1.32   Cardiac Enzymes: Recent Labs  Lab 11/05/17 2103  TROPONINI 0.35*   BNP (last 3 results) No results for input(s): PROBNP in the last 8760 hours. HbA1C: Recent Labs    11/05/17 1858  HGBA1C 7.5*   CBG: Recent Labs  Lab 11/06/17 1203 11/06/17 1601 11/06/17 2052 11/07/17 0752 11/07/17 1119  GLUCAP  167* 163* 139* 134* 201*   Lipid Profile: No results for input(s): CHOL, HDL, LDLCALC, TRIG, CHOLHDL, LDLDIRECT in the last 72 hours. Thyroid Function Tests: No results for input(s): TSH, T4TOTAL, FREET4, T3FREE, THYROIDAB in the last 72 hours. Anemia Panel: No results for input(s): VITAMINB12, FOLATE, FERRITIN, TIBC, IRON, RETICCTPCT in the last 72 hours. Sepsis Labs: No results for input(s): PROCALCITON, LATICACIDVEN in the last 168 hours.  Recent Results (from the past 240 hour(s))  Culture, Urine     Status: Abnormal   Collection Time: 11/05/17  4:24 PM  Result Value Ref Range Status   Specimen Description URINE, CLEAN CATCH  Final   Special Requests NONE  Final   Culture (A)  Final    <10,000 COLONIES/mL INSIGNIFICANT GROWTH Performed at [facility], [address], [place], [place] [address]    Report Status 11/06/2017 FINAL  Final  MRSA PCR Screening  Status: None   Collection Time: 11/05/17  9:52 PM  Result Value Ref Range Status   MRSA by PCR NEGATIVE NEGATIVE Final    Comment:        The GeneXpert MRSA Assay (FDA approved for NASAL specimens only), is one component of a comprehensive MRSA colonization surveillance program. It is not intended to diagnose MRSA infection nor to guide or monitor treatment for MRSA infections. Performed at [facility], [place] [address], [place], [place] [address]          Radiology Studies: [place] Ekg Site Rite  Result Date: 11/07/2017 If [facility] image not attached, placement could not be confirmed due to current cardiac rhythm.       Scheduled Meds: . apixaban  5 mg Oral BID  . dronabinol  2.5 mg Oral BID AC  . famciclovir  250 mg Oral Daily  . gabapentin  800 mg Oral QHS  . insulin aspart  0-15 Units Subcutaneous TID WC  . insulin aspart  0-5 Units Subcutaneous QHS  . insulin aspart  4 Units Subcutaneous TID WC  . LORazepam  1 mg Oral Once  . magnesium oxide  400 mg Oral Daily  . Melatonin  6 mg Oral  QHS  . pantoprazole  40 mg Oral Daily  . potassium chloride SA  20 mEq Oral Daily  . rosuvastatin  10 mg Oral Daily  . sodium chloride flush  10-40 mL Intracatheter Q12H  . sodium chloride flush  3 mL Intravenous Q12H  . traZODone  50 mg Oral QHS   Continuous Infusions: . sodium chloride    . amiodarone 30 mg/hr (11/07/17 1200)     LOS: 2 days    Time spent: 35 mins.More than 50% of that time was spent in counseling and/or coordination of care.      Shelly Coss, MD [facility] Pager [phone]  If 7PM-7AM, please contact night-coverage [email or web address] Password [facility] 11/07/2017, 2:43 PM

## 2017-11-07 NOTE — Progress Notes (Signed)
Consult to Spiritual Care Acknowledged.   Patient and Family system were not interested in completing an Advanced Directive or Living Will

## 2017-11-07 NOTE — Progress Notes (Addendum)
Advanced Heart Failure Rounding Note  PCP-Cardiologist: Glenetta Hew, MD   Subjective:    Diuresed very well overnight. Weight down another 2 pounds. (7 pounds total).   Breathing better but still feels weak. No orthopnea or PND. CVP remains 15-16  Co-ox 81% this am (likely inaccurate) K 2.9  Cr 1.6-> 1.5    Objective:   Weight Range: 182 lb 12.8 oz (82.9 kg) Body mass index is 26.99 kg/m.   Vital Signs:   Temp:  [97.6 F (36.4 C)-97.8 F (36.6 C)] 97.6 F (36.4 C) (07/26 0518) Pulse Rate:  [59-96] 96 (07/26 0002) Resp:  [18] 18 (07/26 0518) BP: (96-122)/(69-81) 104/75 (07/26 0518) SpO2:  [94 %-98 %] 94 % (07/26 0518) Weight:  [182 lb 12.8 oz (82.9 kg)] 182 lb 12.8 oz (82.9 kg) (07/26 0518) Last BM Date: 11/05/17  Weight change: Filed Weights   11/05/17 1400 11/06/17 0500 11/07/17 0518  Weight: 185 lb 6.5 oz (84.1 kg) 187 lb 9.8 oz (85.1 kg) 182 lb 12.8 oz (82.9 kg)    Intake/Output:   Intake/Output Summary (Last 24 hours) at 11/07/2017 0841 Last data filed at 11/07/2017 0838 Gross per 24 hour  Intake 817.27 ml  Output 2980 ml  Net -2162.73 ml      Physical Exam    General:  Sitting up on side of bed. No resp difficulty HEENT: normal Neck: supple. JVP to ear with prominent CV waves  Carotids 2+ bilat; no bruits. No lymphadenopathy or thryomegaly appreciated. Cor: PMI nondisplaced. Mildly irregular rate & rhythm. 2/6 TR Lungs: minimal basilar crackles  Abdomen: soft, nontender, nondistended. No hepatosplenomegaly. No bruits or masses. Good bowel sounds. Extremities: no cyanosis, clubbing, rash, edema RUE picc  Neuro: alert & orientedx3, cranial nerves grossly intact. moves all 4 extremities w/o difficulty. Affect pleasant   Telemetry   Aflutter 90-100 bpm, personally reviewed.   EKG    No new tracings.    Labs    CBC Recent Labs    11/06/17 0636 11/07/17 0505  WBC 9.3 10.8*  HGB 12.6* 13.1  HCT 39.7 41.6  MCV 87.6 89.3  PLT 128* 141*    Basic Metabolic Panel Recent Labs    11/06/17 0636 11/07/17 0505  NA 139 141  K 4.0 3.6  CL 101 97*  CO2 28 30  GLUCOSE 161* 142*  BUN 37* 42*  CREATININE 1.46* 1.63*  CALCIUM 8.8* 9.0  MG 1.9 2.0   Liver Function Tests No results for input(s): AST, ALT, ALKPHOS, BILITOT, PROT, ALBUMIN in the last 72 hours. No results for input(s): LIPASE, AMYLASE in the last 72 hours. Cardiac Enzymes Recent Labs    11/05/17 2103  TROPONINI 0.35*    BNP: BNP (last 3 results) Recent Labs    06/25/17 1207 09/15/17 1401 11/05/17 1310  BNP 420.9* 393.0* 515.3*    ProBNP (last 3 results) No results for input(s): PROBNP in the last 8760 hours.   D-Dimer No results for input(s): DDIMER in the last 72 hours. Hemoglobin A1C Recent Labs    11/05/17 1858  HGBA1C 7.5*   Fasting Lipid Panel No results for input(s): CHOL, HDL, LDLCALC, TRIG, CHOLHDL, LDLDIRECT in the last 72 hours. Thyroid Function Tests No results for input(s): TSH, T4TOTAL, T3FREE, THYROIDAB in the last 72 hours.  Invalid input(s): FREET3  Other results:   Imaging     No results found.   Medications:     Scheduled Medications: . apixaban  5 mg Oral BID  . dronabinol  2.5 mg Oral BID AC  . famciclovir  250 mg Oral Daily  . gabapentin  800 mg Oral QHS  . insulin aspart  0-15 Units Subcutaneous TID WC  . insulin aspart  0-5 Units Subcutaneous QHS  . insulin aspart  4 Units Subcutaneous TID WC  . LORazepam  1 mg Oral Once  . magnesium oxide  400 mg Oral Daily  . Melatonin  6 mg Oral QHS  . pantoprazole  40 mg Oral Daily  . potassium chloride SA  20 mEq Oral Daily  . rosuvastatin  10 mg Oral Daily  . sodium chloride flush  3 mL Intravenous Q12H  . traZODone  50 mg Oral QHS     Infusions: . sodium chloride    . amiodarone 30 mg/hr (11/07/17 2706)     PRN Medications:  sodium chloride, acetaminophen, LORazepam, ondansetron (ZOFRAN) IV, sodium chloride flush, zolpidem    Patient  Profile   Dillon Andrews is a 77 y.o. male with a hx of chronic combined CHF due to NICM, CKD III,presumedNASH(per pt, never proven),esophageal varices per chart(prior glass of wine occasionally but no heavy alcohol),HTN, HLD, DM, Barrett's esophagus, GERD, anemia, RBBB, TTR amyloidosis, and Kappa Light Chain myeloma.  Admitted to [facility] 11/05/17 in setting of new Afib with RVR.   Assessment/Plan   1. Acute on chronic combined HF with TTR amyloidosis (Wild Type) - NICM by cath at [facility] 09/2016 with EF 40-45%. Echo 06/2017: EF 25-30%, mild LAE, mild/mod RV dilation, mod/severe RA. - PYP scan strongly suggestive of TTR amyloid, SPEP with 0.8% m-spike, UPEP with Bence Jones Protein positive, kappa type. 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 - Now also diagnosed with multiple myeloma so suspect this may be AL amyloid but wouldn't expect PYP to be positive. She may have both TTR and AL amyloid but this would be unusual  -Has been approved for Tafamidis, but his co-pay is $1000, which he cannot afford. Will defer for now in setting of treatment for MM - Volume status remains elevated with CVP 14-16. Continue IV diuresis. May not get much lower with RV failure.  - Co-ox 81% will repeat.  - Carvedilol on hold with marginal co-ox yesterday. On amio for AFL  - Continue spiro 12.5 mg daily - No ARB/entresto with CKD   2. New onset AFlutter  - likely triggered HF decompensation  - Rates now improved Continue amiodarone gtt. - On Eliquis 5 bid. No bleeding - CHA2DS2/VASc is at least 6. (CHF, HTN, DM, CAD (mild, non-obstructive), and 2 for Age) - TEE/DC-CV Monday at 1p.  We discussed risk of bleeding with varices but I feel benefit outweighs risk.  3. CKD III, baseline ~1.6 -Creatinine stable today at 1.5. Continue to follow   4. Recurrent bilateral effusions - Somewhat increased from March on CXR 11/05/17. Follow with diuresis .   5. Kappa Lightchain Myeloma - Follows with Dr.  Marin Olp who has seen this admission.  - Started on Bortezomib7/18/18andRevlimid7/23/19. - Continue management per primary team. No change.   6. NASH with esophageal varices.  - Watch closely for bleeding with AC.  - Last EGD 06/21/2014 at [facility] for surveillance of Esophageal varices, Barrett's esophagus, and PMHx of Colon polyps. Showed two columns of small esophageal varices w/out stigmata, short segment Barrett's esophagus (without biopsies, due to presence of EVs), 5cm hiatal hernia, and hyperplastic small gastric nodules  7. DM2 - Per primary.   8. Hypokalemia. - will supp.  - repeat BMET  at 1P.   Medication concerns reviewed with patient and pharmacy team. Barriers identified: None at this time.   Length of Stay: 2  Glori Bickers, MD  9:47 AM  Advanced Heart Failure Team Pager [phone] (M-F; 7a - 4p)  Please contact [facility] for night-coverage after hours (4p -7a ) and weekends on [email or web address]

## 2017-11-07 NOTE — Progress Notes (Signed)
Physical Therapy Treatment Patient Details Name: Dillon Andrews MRN: [id number] DOB: 11-13-1940 Today's Date: 11/07/2017    History of Present Illness Pt is a 77 y/o male admitted secondary to worsening SOB and chest tightness. Found to have a fib with RVR. PMH inlcudes kappa light chain myeloma (currently undergoing chemotherapy), CHF, nonischemic cardiomyopathy, pulmonary HTN, and CKD.     PT Comments    Pt progressing with therapy this visit. Tolerating short distance ambulation in hallway before fatigue and dizzy, SpO2 WNL on 3L O2, HR ranged 95-117 this session, with multiple PVCs. Pt min guard level for all mobility. HR subsides to resting 95-105 with short rest break. No c/o of chest pain. Patient eager to [place] to golf course with his daughter.     Follow Up Recommendations  Home health PT;Supervision for mobility/OOB     Equipment Recommendations  None recommended by PT    Recommendations for Other Services       Precautions / Restrictions Precautions Precautions: Other (comment) Precaution Comments: watch HR; target HR 65-105 bpm Restrictions Weight Bearing Restrictions: No    Mobility  Bed Mobility Overal bed mobility: Needs Assistance Bed Mobility: Supine to Sit     Supine to sit: Supervision     General bed mobility comments: Supervision for safety. Increased time required.   Transfers Overall transfer level: Needs assistance Equipment used: Rolling walker (2 wheeled) Transfers: Sit to/from Stand Sit to Stand: Min guard            Ambulation/Gait Ambulation/Gait assistance: Min guard Gait Distance (Feet): 85 Feet Assistive device: Rolling walker (2 wheeled) Gait Pattern/deviations: Step-through pattern;Decreased stride length Gait velocity: decreased   General Gait Details: patient use to rollator, dislikes RW. ambulated 24' with x1 rest break. HR max 117, subsides down to 95 with rest. PVCs noted.    Stairs             Wheelchair  Mobility    Modified Rankin (Stroke Patients Only)       Balance Overall balance assessment: Needs assistance Sitting-balance support: No upper extremity supported;Feet supported Sitting balance-Leahy Scale: Good     Standing balance support: Bilateral upper extremity supported;During functional activity Standing balance-Leahy Scale: Poor Standing balance comment: Reliant on BUE support.                             Cognition Arousal/Alertness: Awake/alert Behavior During Therapy: WFL for tasks assessed/performed Overall Cognitive Status: Within Functional Limits for tasks assessed                                        Exercises      General Comments        Pertinent Vitals/Pain Pain Assessment: No/denies pain    Home Living                      Prior Function            PT Goals (current goals can now be found in the care plan section) Acute Rehab PT Goals Patient Stated Goal: "to feel better"  PT Goal Formulation: With patient Time For Goal Achievement: 11/20/17 Potential to Achieve Goals: Good Progress towards PT goals: Progressing toward goals    Frequency    Min 3X/week      PT Plan Current plan remains appropriate  Co-evaluation              AM-PAC PT "6 Clicks" Daily Activity  Outcome Measure  Difficulty turning over in bed (including adjusting bedclothes, sheets and blankets)?: A Little Difficulty moving from lying on back to sitting on the side of the bed? : A Little Difficulty sitting down on and standing up from a chair with arms (e.g., wheelchair, bedside commode, etc,.)?: Unable Help needed moving to and from a bed to chair (including a wheelchair)?: A Little Help needed walking in hospital room?: A Little Help needed climbing 3-5 steps with a railing? : A Lot 6 Click Score: 15    End of Session Equipment Utilized During Treatment: Gait belt Activity Tolerance: Patient tolerated  treatment well Patient left: in chair;with call bell/phone within reach Nurse Communication: Mobility status PT Visit Diagnosis: Unsteadiness on feet (R26.81);Muscle weakness (generalized) (M62.81);Other abnormalities of gait and mobility (R26.89)     Time: 1216-2446 PT Time Calculation (min) (ACUTE ONLY): 34 min  Charges:  $Gait Training: 23-37 mins                     Reinaldo Berber, PT, DPT Acute Rehab Services Pager: [phone]    Reinaldo Berber 11/07/2017, 1:03 PM

## 2017-11-07 NOTE — Care Management Note (Addendum)
Case Management Note  Patient Details  Name: Dillon Andrews MRN: [id number] Date of Birth: 1940/08/26  Subjective/Objective: Pt presented for Atrial Fib- Initiated on IV Amio gtt. Plan for Cardioversion question Monday. PT recommendations for [facility] PT Services. Agency List provided and patient chose [facility] for Services. Patient has DME RW at home.                    Action/Plan: Referral sent to [facility] with [facility] for PT. Pt will need HH PT order and F2F. SOC to begin within 24-48 hours post transition home. CM will provide patient with Eliquis 30 day free card. [facility] [address] has medication available. No further needs from CM at this time.    Expected Discharge Date:  11/10/17               Expected Discharge Plan:  [place]  In-House Referral:  NA  Discharge planning Services  CM Consult, Medication Assistance  Post Acute Care Choice:  Home Health Choice offered to:  Patient  DME Arranged:  N/A DME Agency:  NA  HH Arranged:  PT, RN [place] Agency:  [place]  Status of Service:  Completed, signed off  If discussed at [place] of Stay Meetings, dates discussed:    Additional Comments: 1118 11-14-17 Jacqlyn Krauss, RN BSN [phone] Pt will benefit from [facility] RN once stable to transition home. Pt will need HH RN, PT & F2F once stable to transition home. [facility] aware that RN will be added. No further needs from CM at this time.    S/W DONNA @ [place] [facility] # [phone]    1. ELIQUIS 2.5 MG BID  COVER- YES  CO-PAY- $ 8.50  TIER- 3 DRUG  PRIOR APPROVAL - NO   2. ELIQUIS  5 MG BID  COVER- YES  CO-PAY- $ 8.50  TIER- 3 DRUG  PRIOR APPROVAL- NO    PREFERRED PHARMACY: YES  [facility]  [place] [place] DONNA @ [place] # [phone]    1. XARELTO 20 MG DAILY  COVER- YES  CO-PAY- $ 8.50  TIER- 3 DRUG  PRIOR APPROVAL- NO   PREFERRED PHARMACY : YES  [facility]    Graves-Bigelow, Ocie Cornfield,  RN 11/07/2017, 11:29 AM

## 2017-11-07 NOTE — Progress Notes (Signed)
Peripherally Inserted Central Catheter/Midline Placement  The IV Nurse has discussed with the patient and/or persons authorized to consent for the patient, the purpose of this procedure and the potential benefits and risks involved with this procedure.  The benefits include less needle sticks, lab draws from the catheter, and the patient may be discharged home with the catheter. Risks include, but not limited to, infection, bleeding, blood clot (thrombus formation), and puncture of an artery; nerve damage and irregular heartbeat and possibility to perform a PICC exchange if needed/ordered by physician.  Alternatives to this procedure were also discussed.  Bard Power PICC patient education guide, fact sheet on infection prevention and patient information card has been provided to patient /or left at bedside.    PICC/Midline Placement Documentation  PICC Double Lumen 11/07/17 PICC Right Cephalic 42 cm 0 cm (Active)  Indication for Insertion or Continuance of Line Vasoactive infusions 11/07/2017 10:46 AM  Exposed Catheter (cm) 0 cm 11/07/2017 10:46 AM  Site Assessment Clean;Dry;Intact 11/07/2017 10:46 AM  Lumen #1 Status Flushed;Blood return noted 11/07/2017 10:46 AM  Lumen #2 Status Flushed;Blood return noted 11/07/2017 10:46 AM  Dressing Type Transparent 11/07/2017 10:46 AM  Dressing Status Clean;Dry;Intact;Antimicrobial disc in place 11/07/2017 10:46 AM  Dressing Intervention New dressing 11/07/2017 10:46 AM  Dressing Change Due 11/14/17 11/07/2017 10:46 AM       Dillon Andrews 11/07/2017, 10:47 AM

## 2017-11-08 LAB — COOXEMETRY PANEL
CARBOXYHEMOGLOBIN: 0.9 % (ref 0.5–1.5)
Carboxyhemoglobin: 1.1 % (ref 0.5–1.5)
Methemoglobin: 1.3 % (ref 0.0–1.5)
Methemoglobin: 1.5 % (ref 0.0–1.5)
O2 Saturation: 80.5 %
O2 Saturation: 48 %
Total hemoglobin: 11.7 g/dL — ABNORMAL LOW (ref 12.0–16.0)
Total hemoglobin: 12.5 g/dL (ref 12.0–16.0)

## 2017-11-08 LAB — BASIC METABOLIC PANEL
Anion gap: 15 (ref 5–15)
Anion gap: 12 (ref 5–15)
BUN: 37 mg/dL — ABNORMAL HIGH (ref 8–23)
BUN: 38 mg/dL — ABNORMAL HIGH (ref 8–23)
CO2: 34 mmol/L — ABNORMAL HIGH (ref 22–32)
CO2: 32 mmol/L (ref 22–32)
CREATININE: 1.89 mg/dL — AB (ref 0.61–1.24)
Calcium: 8.6 mg/dL — ABNORMAL LOW (ref 8.9–10.3)
Calcium: 8.5 mg/dL — ABNORMAL LOW (ref 8.9–10.3)
Chloride: 92 mmol/L — ABNORMAL LOW (ref 98–111)
Chloride: 94 mmol/L — ABNORMAL LOW (ref 98–111)
Creatinine, Ser: 1.53 mg/dL — ABNORMAL HIGH (ref 0.61–1.24)
GFR calc Af Amer: 38 mL/min — ABNORMAL LOW (ref >60)
GFR calc non Af Amer: 42 mL/min — ABNORMAL LOW (ref >60)
GFR calc non Af Amer: 32 mL/min — ABNORMAL LOW (ref >60)
GFR, EST AFRICAN AMERICAN: 48 mL/min — AB (ref >60)
Glucose, Bld: 168 mg/dL — ABNORMAL HIGH (ref 70–99)
Glucose, Bld: 213 mg/dL — ABNORMAL HIGH (ref 70–99)
POTASSIUM: 2.9 mmol/L — AB (ref 3.5–5.1)
Potassium: 3.6 mmol/L (ref 3.5–5.1)
Sodium: 141 mmol/L (ref 135–145)
Sodium: 138 mmol/L (ref 135–145)

## 2017-11-08 LAB — GLUCOSE, CAPILLARY
GLUCOSE-CAPILLARY: 186 mg/dL — AB (ref 70–99)
Glucose-Capillary: 136 mg/dL — ABNORMAL HIGH (ref 70–99)
Glucose-Capillary: 155 mg/dL — ABNORMAL HIGH (ref 70–99)
Glucose-Capillary: 132 mg/dL — ABNORMAL HIGH (ref 70–99)

## 2017-11-08 MED ORDER — POTASSIUM CHLORIDE CRYS ER 20 MEQ PO TBCR
20.0000 meq | EXTENDED_RELEASE_TABLET | Freq: Once | ORAL | Status: AC
  Administered 2017-11-08: 20 meq via ORAL

## 2017-11-08 MED ORDER — POTASSIUM CHLORIDE CRYS ER 20 MEQ PO TBCR
40.0000 meq | EXTENDED_RELEASE_TABLET | Freq: Once | ORAL | Status: AC
  Administered 2017-11-08: 40 meq via ORAL
  Filled 2017-11-08: qty 2

## 2017-11-08 MED ORDER — SPIRONOLACTONE 12.5 MG HALF TABLET
12.5000 mg | ORAL_TABLET | Freq: Every day | ORAL | Status: DC
  Administered 2017-11-08: 12.5 mg via ORAL
  Filled 2017-11-08 (×2): qty 1

## 2017-11-08 MED ORDER — POTASSIUM CHLORIDE CRYS ER 20 MEQ PO TBCR
40.0000 meq | EXTENDED_RELEASE_TABLET | Freq: Two times a day (BID) | ORAL | Status: AC
  Administered 2017-11-08 – 2017-11-12 (×9): 40 meq via ORAL
  Filled 2017-11-08 (×10): qty 2

## 2017-11-08 NOTE — Progress Notes (Signed)
Physical Therapy Treatment Patient Details Name: Dillon Andrews MRN: [id number] DOB: 10-31-1940 Today's Date: 11/08/2017    History of Present Illness Pt is a 77 y/o male admitted secondary to worsening SOB and chest tightness. Found to have a fib with RVR. PMH inlcudes kappa light chain myeloma (currently undergoing chemotherapy), CHF, nonischemic cardiomyopathy, pulmonary HTN, and CKD.     PT Comments    Pt demonstrating mild improvements requiring less physical assistance with transfers today. Limited session for safety, missed beats and and high HR, ranging 108-135 this visit. Pt reports mild dizziness with ambulating, SpO2 WNL on 2L this visit. Encouraged patient to mobilize to bathroom with nursing staff, will progressed as tolerated pending cardioversion tentatively scheduled for Monday.     Follow Up Recommendations  Home health PT;Supervision for mobility/OOB     Equipment Recommendations  None recommended by PT    Recommendations for Other Services       Precautions / Restrictions Precautions Precautions: Other (comment) Precaution Comments: watch HR; target HR 65-105 bpm Restrictions Weight Bearing Restrictions: No    Mobility  Bed Mobility Overal bed mobility: Needs Assistance Bed Mobility: Supine to Sit     Supine to sit: Supervision     General bed mobility comments: Supervision for safety. Increased time required.   Transfers Overall transfer level: Needs assistance Equipment used: Rolling walker (2 wheeled) Transfers: Sit to/from Stand Sit to Stand: Supervision         General transfer comment: supervision today with RW  Ambulation/Gait Ambulation/Gait assistance: Min guard Gait Distance (Feet): 50 Feet Assistive device: Rolling walker (2 wheeled) Gait Pattern/deviations: Step-through pattern;Decreased stride length Gait velocity: decreased   General Gait Details: limited ambulation due to high HR, 136 max with missed beats, returned to  bed patient denies chest pain, slight light head resolved when seated. discussed with Science writer Rankin (Stroke Patients Only)       Balance Overall balance assessment: Needs assistance Sitting-balance support: No upper extremity supported;Feet supported Sitting balance-Leahy Scale: Good     Standing balance support: Bilateral upper extremity supported;During functional activity Standing balance-Leahy Scale: Poor Standing balance comment: Reliant on BUE support.                             Cognition Arousal/Alertness: Awake/alert Behavior During Therapy: WFL for tasks assessed/performed Overall Cognitive Status: Within Functional Limits for tasks assessed                                        Exercises      General Comments        Pertinent Vitals/Pain Pain Assessment: No/denies pain    Home Living                      Prior Function            PT Goals (current goals can now be found in the care plan section) Acute Rehab PT Goals Patient Stated Goal: "to feel better"  PT Goal Formulation: With patient Time For Goal Achievement: 11/20/17 Potential to Achieve Goals: Good Progress towards PT goals: Progressing toward goals    Frequency    Min 3X/week      PT Plan Current plan  remains appropriate    Co-evaluation              AM-PAC PT "6 Clicks" Daily Activity  Outcome Measure  Difficulty turning over in bed (including adjusting bedclothes, sheets and blankets)?: A Little Difficulty moving from lying on back to sitting on the side of the bed? : A Little Difficulty sitting down on and standing up from a chair with arms (e.g., wheelchair, bedside commode, etc,.)?: A Lot Help needed moving to and from a bed to chair (including a wheelchair)?: A Little Help needed walking in hospital room?: A Little Help needed climbing 3-5 steps with a railing? : A Lot 6  Click Score: 16    End of Session Equipment Utilized During Treatment: Gait belt Activity Tolerance: Patient tolerated treatment well Patient left: in chair;with call bell/phone within reach Nurse Communication: Mobility status PT Visit Diagnosis: Unsteadiness on feet (R26.81);Muscle weakness (generalized) (M62.81);Other abnormalities of gait and mobility (R26.89)     Time: 7741-4239 PT Time Calculation (min) (ACUTE ONLY): 15 min  Charges:  $Gait Training: 8-22 mins                     Reinaldo Berber, PT, DPT Acute Rehab Services Pager: [phone]     Reinaldo Berber 11/08/2017, 4:42 PM

## 2017-11-08 NOTE — Progress Notes (Signed)
PROGRESS NOTE    Dillon Andrews  SWF:[id number] DOB: [birth year] DOA: 11/05/2017 PCP: [name], MD    Subjective:  Patient was seen and examined this morning, stable no acute distress.  No issues overnight.  Denies any worsening shortness of breath or chest pain. Wheezing well, lost approximately seven-point since admission Potassium low this morning 2.9  ------------------------------------------------------------------------------------------------------------------------------------------------  Brief Narrative: Patient is a 77 year old male with past medical history of liver cirrhosis with esophageal varices, combined systolic and diastolic CHF with ejection fraction of 25 to 30%, nonischemic cardiomyopathy, pulmonary hypertension, cardiac amyloidosis, CKD stage III, kappa light chain myeloma on chemotherapy was brought to the emergency department due to shortness of breath.  Patient was found to be in A. fib with RVR with hypotension on presentation.  Cardiology was consulted.  Started on amiodarone drip.  Also started on heparin for new onset A. fib.  Heart failure team is following now.Plan is to do DC cardioversion as per cardiology.  Assessment & Plan:   Principal Problem:   Atrial fibrillation with RVR ([place]), new onset Active Problems:   CKD (chronic kidney disease) stage 3, GFR 30-59 ml/min (HCC), baseline Cr 1.5   DM2 (diabetes mellitus, type 2) (HCC)   Acute on chronic combined systolic and diastolic CHF (congestive heart failure) (HCC)   Bilateral pleural effusion   Esophageal varices in cirrhosis (HCC)   SOB (shortness of breath)   Non-ischemic cardiomyopathy ([place])   Other secondary pulmonary hypertension (HCC)   RBBB   Cardiac amyloidosis (HCC)   Kappa light chain myeloma (HCC)   Leukocytosis   Elevated troponin   Elevated brain natriuretic peptide (BNP) level, acute on chronic   Hypotension   Palliative care by specialist  New onset A. fib with  RVR:  Heart rate has improved [place] secondary to decompensated heart failure still on amiodarone drip -Started on Eliquis 5 mg p.o. twice daily due toCHADS2VASC score> 6 -Cardiology following, planning for transesophageal cardioversion Monday at 1 PM.    s/dCHF:  -Acute on chronic combined heart failure with TKR amyloidosis ()wild-type) -On IV Lasix, diuresing well, lost approximately 7 pounds since admission -Echo 07/01/2017, ejection fraction 25-30%, associated with amyloidosis, -Is been approved for Tafamidis  Not on ARB or Entresto due to CKD.   Cirrhosis with esophageal varices: Likely  NASH.  Follow-up with GI as an outpatient.  We will monitor him closely for bleeding especially with anticoagulation.  Kappa light chain myeloma:  -Patient has been diagnosed with multiple myeloma-suspected this may be a AL amyeloid Follows with Dr. Marin Olp.  Just had chemotherapy  few days ago.  Was on Bortezomib and Revlimid  CKD stage III:  -Baseline creatinine 1.6 stable -Monitoring BUN/creatinine   Diabetes mellitus type 2:  -SSI We will continue monitor his blood sugars.  Continue current regimen.   DVT prophylaxis: Eliquis Code Status: Full Family Communication: Daughter present at the bedside Disposition Plan: To home in 1-2 days.  Waiting for stabilization of the heart rate.   Consultants: Heme-onc /Cardiology  Procedures:None  Antimicrobials:None   Objective: Vitals:   11/08/17 0004 11/08/17 0442 11/08/17 0736 11/08/17 1300  BP: 109/82 104/82 102/61 111/76  Pulse: 85 70 70 98  Resp:   19 19  Temp: 97.9 F (36.6 C) 97.7 F (36.5 C) 97.6 F (36.4 C) 97.7 F (36.5 C)  TempSrc: Oral Oral Oral Oral  SpO2: 92% 97% 96% 97%  Weight:  81.9 kg (180 lb 9.6 oz)    Height:  Intake/Output Summary (Last 24 hours) at 11/08/2017 1434 Last data filed at 11/08/2017 1400 Gross per 24 hour  Intake 968.4 ml  Output 4000 ml  Net -3031.6 ml   Filed Weights   11/06/17  0500 11/07/17 0518 11/08/17 0442  Weight: 85.1 kg (187 lb 9.8 oz) 82.9 kg (182 lb 12.8 oz) 81.9 kg (180 lb 9.6 oz)   BP 111/76 (BP Location: Left Arm)   Pulse 98   Temp 97.7 F (36.5 C) (Oral)   Resp 19   Ht 5' 9" (1.753 m)   Wt 81.9 kg (180 lb 9.6 oz)   SpO2 97%   BMI 26.67 kg/m    Physical Exam  Constitution:  Alert, cooperative, no distress, cachectic, Psychiatric: Normal and stable mood and affect, cognition intact,   HEENT: Normocephalic, PERRL, otherwise with in Normal limits  Cardio vascular:  S1/S2, RRR, No murmure, No Rubs or Gallops  Chest/pulmonary: Clear to auscultation bilaterally, respirations unlabored  Chest symmetric Abdomen: Soft, non-tender, non-distended, bowel sounds,no masses, no organomegaly Muscular skeletal: Limited exam - in bed, able to move all 4 extremities, Normal strength,  Neuro: CNII-XII intact. , normal motor and sensation, reflexes intact  Extremities: No pitting edema lower extremities, +2 pulses  Skin: Dry, warm to touch, negative for any Rashes, No open wounds    Data Reviewed: I have personally reviewed following labs and imaging studies  CBC: Recent Labs  Lab 11/05/17 1212 11/06/17 0636 11/07/17 0505  WBC 13.2* 9.3 10.8*  HGB 13.1 12.6* 13.1  HCT 42.1 39.7 41.6  MCV 90.1 87.6 89.3  PLT 157 128* 924*   Basic Metabolic Panel: Recent Labs  Lab 11/05/17 1212 11/06/17 0636 11/07/17 0505 11/08/17 0615  NA 139 139 141 141  K 4.1 4.0 3.6 2.9*  CL 99 101 97* 92*  CO2 _0 34*  GLUCOSE 215* 161* 142* 168*  BUN 38* 37* 42* 37*  CREATININE 1.54* 1.46* 1.63* 1.53*  CALCIUM 8.9 8.8* 9.0 8.6*  MG  --  1.9 2.0  --   Coagulation Profile: Recent Labs  Lab 11/05/17 1310  INR 1.32   Cardiac Enzymes: Recent Labs  Lab 11/05/17 2103  TROPONINI 0.35*   BNP (last 3 results) No results for input(s): PROBNP in the last 8760 hours. HbA1C: Recent Labs    11/05/17 1858  HGBA1C 7.5*   CBG: Recent Labs  Lab 11/07/17 1119  11/07/17 1632 11/07/17 2039 11/08/17 0736 11/08/17 1208  GLUCAP 201* 130* 133* 136* 155*   Lipid Profile: No results for input(s): CHOL, HDL, LDLCALC, TRIG, CHOLHDL, LDLDIRECT in the last 72 hours. Thyroid Function Tests: No results for input(s): TSH, T4TOTAL, FREET4, T3FREE, THYROIDAB in the last 72 hours. Anemia Panel: No results for input(s): VITAMINB12, FOLATE, FERRITIN, TIBC, IRON, RETICCTPCT in the last 72 hours. Sepsis Labs: No results for input(s): PROCALCITON, LATICACIDVEN in the last 168 hours.  Recent Results (from the past 240 hour(s))  Culture, Urine     Status: Abnormal   Collection Time: 11/05/17  4:24 PM  Result Value Ref Range Status   Specimen Description URINE, CLEAN CATCH  Final   Special Requests NONE  Final   Culture (A)  Final    <10,000 COLONIES/mL INSIGNIFICANT GROWTH Performed at [facility], [address] [place], [place] [address]    Report Status 11/06/2017 FINAL  Final  MRSA PCR Screening     Status: None   Collection Time: 11/05/17  9:52 PM  Result Value Ref Range Status  MRSA by PCR NEGATIVE NEGATIVE Final    Comment:        The GeneXpert MRSA Assay (FDA approved for NASAL specimens only), is one component of a comprehensive MRSA colonization surveillance program. It is not intended to diagnose MRSA infection nor to guide or monitor treatment for MRSA infections. Performed at [facility], [place] [address], [place], [place] [address]          Radiology Studies: [place] Ekg Site Rite  Result Date: 11/07/2017 If [facility] image not attached, placement could not be confirmed due to current cardiac rhythm.   Scheduled Meds: . apixaban  5 mg Oral BID  . dronabinol  2.5 mg Oral BID AC  . famciclovir  250 mg Oral Daily  . furosemide  80 mg Intravenous BID  . gabapentin  800 mg Oral QHS  . insulin aspart  0-15 Units Subcutaneous TID WC  . insulin aspart  0-5 Units Subcutaneous QHS  . insulin aspart  4 Units Subcutaneous  TID WC  . LORazepam  1 mg Oral Once  . magnesium oxide  400 mg Oral Daily  . Melatonin  6 mg Oral QHS  . pantoprazole  40 mg Oral Daily  . potassium chloride SA  40 mEq Oral BID  . rosuvastatin  10 mg Oral Daily  . sodium chloride flush  10-40 mL Intracatheter Q12H  . sodium chloride flush  3 mL Intravenous Q12H  . spironolactone  12.5 mg Oral Daily  . traZODone  50 mg Oral QHS   Continuous Infusions: . sodium chloride    . amiodarone 30 mg/hr (11/08/17 0920)     LOS: 3 days    Time spent: 35 mins.More than 50% of that time was spent in counseling and/or coordination of care.   Deatra James, MD [facility] Pager [phone]  If 7PM-7AM, please contact night-coverage [email or web address] Password [facility] 11/08/2017, 2:34 PM

## 2017-11-08 NOTE — Progress Notes (Signed)
Advanced Heart Failure Rounding Note  PCP-Cardiologist: Glenetta Hew, MD   Subjective:    Feeling OK this am, was mildly lightheaded with standing to urinate last night. SOB and swelling improved. Legs cold to the touch.   Feels weak. Remains in AFL on amio. On Eliquis. No bleeding.   Weight down 5 lbs. Cr 1.4 - 1.6  Objective:   Weight Range: 182 lb 12.8 oz (82.9 kg) Body mass index is 26.99 kg/m.   Vital Signs:                 Temp:  [97.6 F (36.4 C)-97.8 F (36.6 C)] 97.6 F (36.4 C) (07/26 0518) Pulse Rate:  [59-96] 96 (07/26 0002) Resp:  [18] 18 (07/26 0518) BP: (96-122)/(69-81) 104/75 (07/26 0518) SpO2:  [94 %-98 %] 94 % (07/26 0518) Weight:  [182 lb 12.8 oz (82.9 kg)] 182 lb 12.8 oz (82.9 kg) (07/26 0518) Last BM Date: 11/05/17  Weight change:      Filed Weights   11/05/17 1400 11/06/17 0500 11/07/17 0518  Weight: 185 lb 6.5 oz (84.1 kg) 187 lb 9.8 oz (85.1 kg) 182 lb 12.8 oz (82.9 kg)    Intake/Output:             Intake/Output Summary (Last 24 hours) at 11/07/2017 0841 Last data filed at 11/07/2017 0838    Gross per 24 hour  Intake 817.27 ml  Output 2980 ml  Net -2162.73 ml                 Physical Exam    General:  Chronically ill appearing.  HEENT: Normal anicteric Neck: Supple. JVP ~8 cm. Carotids 2+ bilat; no bruits. No lymphadenopathy or thyromegaly appreciated. Cor: PMI nondisplaced. Irregularly irregular.  Lungs: Clear no wheeze  Abdomen: Soft, nontender, nondistended. No hepatosplenomegaly. No bruits or masses. Good bowel sounds. Extremities: No cyanosis, clubbing, or rash. Trace ankle edema. BLEs COLD to the touch. 2+ DP pulses.  Neuro: Alert & orientedx3, cranial nerves grossly intact. moves all 4 extremities w/o difficulty. Affect pleasant  Telemetry   Afib 100 bpm, personally reviewed.   EKG    No new tracings.    Labs    CBC RecentLabs(last2labs)      Recent Labs    11/06/17 0636  11/07/17 0505  WBC 9.3 10.8*  HGB 12.6* 13.1  HCT 39.7 41.6  MCV 87.6 89.3  PLT 128* 141*     Basic Metabolic Panel [id number])      Recent Labs    11/06/17 0636 11/07/17 0505  NA 139 141  K 4.0 3.6  CL 101 97*  CO2 28 30  GLUCOSE 161* 142*  BUN 37* 42*  CREATININE 1.46* 1.63*  CALCIUM 8.8* 9.0  MG 1.9 2.0     Liver Function Tests RecentLabs(last2labs)  No results for input(s): AST, ALT, ALKPHOS, BILITOT, PROT, ALBUMIN in the last 72 hours.   RecentLabs(last2labs)  No results for input(s): LIPASE, AMYLASE in the last 72 hours.   Cardiac Enzymes RecentLabs(last2labs)     Recent Labs    11/05/17 2103  TROPONINI 0.35*      BNP: BNP (last 3 results) RecentLabs(withinlast365days)  Recent Labs    06/25/17 1207 09/15/17 1401 11/05/17 1310  BNP 420.9* 393.0* 515.3*      ProBNP (last 3 results) RecentLabs(withinlast365days)  No results for input(s): PROBNP in the last 8760 hours.     D-Dimer RecentLabs(last2labs)  No results for input(s): DDIMER in the last 72 hours.  Hemoglobin A1C RecentLabs(last2labs)     Recent Labs    11/05/17 1858  HGBA1C 7.5*     Fasting Lipid Panel RecentLabs(last2labs)  No results for input(s): CHOL, HDL, LDLCALC, TRIG, CHOLHDL, LDLDIRECT in the last 72 hours.   Thyroid Function Tests  RecentLabs(last2labs)  No results for input(s): TSH, T4TOTAL, T3FREE, THYROIDAB in the last 72 hours.  Invalid input(s): FREET3    Other results:   Imaging     No results found.   Medications:     Scheduled Medications:  . apixaban  5 mg Oral BID  . dronabinol  2.5 mg Oral BID AC  . famciclovir  250 mg Oral Daily  . gabapentin  800 mg Oral QHS  . insulin aspart  0-15 Units Subcutaneous TID WC  . insulin aspart  0-5 Units Subcutaneous QHS  . insulin aspart  4 Units Subcutaneous TID WC  . LORazepam  1 mg Oral Once  . magnesium oxide   400 mg Oral Daily  . Melatonin  6 mg Oral QHS  . pantoprazole  40 mg Oral Daily  . potassium chloride SA  20 mEq Oral Daily  . rosuvastatin  10 mg Oral Daily  . sodium chloride flush  3 mL Intravenous Q12H  . traZODone  50 mg Oral QHS     Infusions:  . sodium chloride    . amiodarone 30 mg/hr (11/07/17 6073)     PRN Medications:  sodium chloride, acetaminophen, LORazepam, ondansetron (ZOFRAN) IV, sodium chloride flush, zolpidem    Patient Profile   Dillon Kersh Knightis a 77 y.o.malewith a hx of chronic combined CHF due to NICM, CKD III,presumedNASH(per pt, never proven),esophageal varices per chart(prior glass of wine occasionally but no heavy alcohol),HTN, HLD, DM, Barrett's esophagus, GERD, anemia, RBBB, TTR amyloidosis, and Kappa Light Chain myeloma.  Ad[facility] 11/05/17 in setting of new Afib with RVR.  Assessment/Plan   1.Acute on chronic combined HFwith TTR amyloidosis (Wild Type) -NICM by cath at [facility] 09/2016 with EF 40-45%. Echo 06/2017: EF 25-30%, mild LAE, mild/mod RV dilation, mod/severe RA. -PYP scan strongly suggestive of TTR amyloid, SPEP with 0.8% m-spike, UPEP with Bence Jones Protein positive, kappa type. 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 -Has been approved forTafamidis, but his co-pay is $1000, which he cannot afford.  - Volume statusstable to mildly elevated - Hold lasix with mild AKI, will discuss with MD.  - Continue coreg 6.25 mg BID for now.  - Continue spiro 12.5 mg daily - No ARB/entresto with CKD - Sees DrPatel. - Legs COLD to the touch. ? Low output in setting of Afib.   2. New onset Afib with RVR. - With rates as high as 130s. - Rx options limited with chronically soft pressures - Continue amiodarone gtt. - Continue heparin gtt. Anticipate transition to Eliquis 5 mg BID prior to discharge - Given significant esophogeal varices may be poor candidate for TEE. May need AC for 3-4 weeks followed by  DCCV.  -CHA2DS2/VASc is at least 6. (CHF, HTN, DM, CAD (mild, non-obstructive), and 2 for Age)  3. CKD III, baseline ~1.6 -Creatinine near baseline but up from yesterday.  - Hold lasix this am.   4. Recurrent bilateral effusions -Somewhat increased from March on CXR 11/05/17. Follow.   5. Kappa Lightchain Myeloma - Follows with Dr. Marin Olp who has seen this admission.  -Started on Bortezomib7/18/18andRevlimid7/23/19. - Continue management per primary team. No change.   6. NASH with esophageal varices.  - Watch closely for  bleeding with AC. - [facility] Barrett's esophagus, and PMHx of Colon polyps. Showed two columns of small esophageal varices w/out stigmata, short segment Barrett's esophagus (without biopsies, due to presence of EVs), 5cm hiatal hernia, and hyperplastic small gastric nodules  7. DM2 - Per primary.   Medication concerns reviewed with patient and pharmacy team. Barriers identified: None at this time.   Length of Stay: 2  Annamaria Helling  11/07/2017, 8:41 AM  Advanced Heart Failure Team Pager [phone] (M-F; 7a - 4p)  Please contact [facility] for night-coverage after hours (4p -7a ) and weekends on [email or web address]  Patient seen and examined with the above-signed Advanced Practice Provider and/or Housestaff. I personally reviewed laboratory data, imaging studies and relevant notes. I independently examined the patient and formulated the important aspects of the plan. I have edited the note to reflect any of my changes or salient points. I have personally discussed the plan with the patient and/or family.  Remains very tenuous with biventricular HF. Still in AFL despite IV amio. PICC placed today. CVP 14 and co-ox marginal at 58%. Will resume IV lasix as renal function tolerates. Plan TEE/DC-CV on Monday. Continue Eliquis. We discussed risk of bleeding with varices. We also  discussed possibility for continued decline in setting of myeloma and cardiac amyloidosis.  Glori Bickers, MD  5:22 PM

## 2017-11-09 LAB — GLUCOSE, CAPILLARY
GLUCOSE-CAPILLARY: 156 mg/dL — AB (ref 70–99)
GLUCOSE-CAPILLARY: 195 mg/dL — AB (ref 70–99)
Glucose-Capillary: 167 mg/dL — ABNORMAL HIGH (ref 70–99)
Glucose-Capillary: 117 mg/dL — ABNORMAL HIGH (ref 70–99)

## 2017-11-09 LAB — BASIC METABOLIC PANEL
Anion gap: 13 (ref 5–15)
BUN: 37 mg/dL — AB (ref 8–23)
CO2: 33 mmol/L — ABNORMAL HIGH (ref 22–32)
Calcium: 8.6 mg/dL — ABNORMAL LOW (ref 8.9–10.3)
Chloride: 94 mmol/L — ABNORMAL LOW (ref 98–111)
Creatinine, Ser: 1.71 mg/dL — ABNORMAL HIGH (ref 0.61–1.24)
GFR calc Af Amer: 42 mL/min — ABNORMAL LOW (ref >60)
GFR, EST NON AFRICAN AMERICAN: 37 mL/min — AB (ref >60)
Glucose, Bld: 205 mg/dL — ABNORMAL HIGH (ref 70–99)
Potassium: 4 mmol/L (ref 3.5–5.1)
SODIUM: 140 mmol/L (ref 135–145)

## 2017-11-09 LAB — COOXEMETRY PANEL
Carboxyhemoglobin: 1.5 % (ref 0.5–1.5)
Methemoglobin: 0.9 % (ref 0.0–1.5)
O2 Saturation: 75.9 %
Total hemoglobin: 12.6 g/dL (ref 12.0–16.0)

## 2017-11-09 MED ORDER — METOLAZONE 5 MG PO TABS
2.5000 mg | ORAL_TABLET | Freq: Two times a day (BID) | ORAL | Status: AC
  Administered 2017-11-09 (×2): 2.5 mg via ORAL
  Filled 2017-11-09 (×2): qty 1

## 2017-11-09 MED ORDER — SPIRONOLACTONE 25 MG PO TABS
25.0000 mg | ORAL_TABLET | Freq: Every day | ORAL | Status: DC
  Administered 2017-11-09 – 2017-11-17 (×9): 25 mg via ORAL
  Filled 2017-11-09 (×8): qty 1

## 2017-11-09 NOTE — Progress Notes (Signed)
PROGRESS NOTE    Dillon Andrews  ALP:[id number] DOB: 28-Jul-1940 DOA: 11/05/2017 PCP: [name], MD    Subjective:  Patient was seen and examined this morning, comfortable in bed.  Still complains shortness of breath, Still on amiodarone drip, heart rate ranging between 53 and 104, blood pressure stable ------------------------------------------------------------------------------------------------------------------------------------------------  Brief Narrative: Patient is a 77 year old male with past medical history of liver cirrhosis with esophageal varices, combined systolic and diastolic CHF with ejection fraction of 25 to 30%, nonischemic cardiomyopathy, pulmonary hypertension, cardiac amyloidosis, CKD stage III, kappa light chain myeloma on chemotherapy was brought to the emergency department due to shortness of breath.  Patient was found to be in A. fib with RVR with hypotension on presentation.  Cardiology was consulted.  Started on amiodarone drip.  Also started on heparin for new onset A. fib.  Heart failure team is following now.Plan is to do DC cardioversion as per cardiology.  Assessment & Plan:   Principal Problem:   Atrial fibrillation with RVR ([place]), new onset Active Problems:   CKD (chronic kidney disease) stage 3, GFR 30-59 ml/min (HCC), baseline Cr 1.5   DM2 (diabetes mellitus, type 2) (HCC)   Acute on chronic combined systolic and diastolic CHF (congestive heart failure) (HCC)   Bilateral pleural effusion   Esophageal varices in cirrhosis (HCC)   SOB (shortness of breath)   Non-ischemic cardiomyopathy ([place])   Other secondary pulmonary hypertension (HCC)   RBBB   Cardiac amyloidosis (HCC)   Kappa light chain myeloma (HCC)   Leukocytosis   Elevated troponin   Elevated brain natriuretic peptide (BNP) level, acute on chronic   Hypotension   Palliative care by specialist  New onset A. fib with RVR:  -Stable not having any chest pain, still  complaining shortness of breath, heart rate ranging between 53 and 104 currently 92 -Started on Eliquis 5 mg p.o. twice daily due toCHADS2VASC score> 6 -Cardiology following, planning for transesophageal cardioversion Monday at 1 PM.    s/dCHF:  -Acute on chronic combined heart failure with TKR amyloidosis ()wild-type) -On diuretics IV Lasix, good urine output, loss approximately 8 pounds since admission -Echo 07/01/2017, ejection fraction 25-30%, associated with amyloidosis, -Is been approved for Tafamidis  Not on ARB or Entresto due to CKD.   Cirrhosis with esophageal varices: Likely  NASH.  Follow-up with GI as an outpatient.  We will monitor him closely for bleeding especially with anticoagulation.  Kappa light chain myeloma:  -Patient has been diagnosed with multiple myeloma-suspected this may be a AL amyeloid Follows with Dr. Marin Olp.  Just had chemotherapy  few days ago.  Was on Bortezomib and Revlimid  CKD stage III:  -Baseline creatinine 1.6 stable -Monitoring BUN/creatinine   Diabetes mellitus type 2:  -SSI We will continue monitor his blood sugars.  Continue current regimen.   DVT prophylaxis: Eliquis Code Status: Full Family Communication: Daughter present at the bedside Disposition Plan: To home in 1-2 days.  Waiting for stabilization of the heart rate.   Consultants: Heme-onc /Cardiology  Procedures:None  Antimicrobials:None   Objective: Vitals:   11/09/17 0440 11/09/17 0441 11/09/17 0800 11/09/17 1147  BP:  104/72 104/60 112/80  Pulse:  92    Resp:   18 18  Temp:  97.9 F (36.6 C) 97.9 F (36.6 C) 98.2 F (36.8 C)  TempSrc:  Oral Oral Oral  SpO2:  92% 98% 94%  Weight: 81.3 kg (179 lb 3.2 oz)     Height:  Intake/Output Summary (Last 24 hours) at 11/09/2017 1158 Last data filed at 11/09/2017 0803 Gross per 24 hour  Intake 621.01 ml  Output 2650 ml  Net -2028.99 ml   Filed Weights   11/07/17 0518 11/08/17 0442 11/09/17 0440  Weight:  82.9 kg (182 lb 12.8 oz) 81.9 kg (180 lb 9.6 oz) 81.3 kg (179 lb 3.2 oz)   BP 112/80 (BP Location: Left Arm)   Pulse 92   Temp 98.2 F (36.8 C) (Oral)   Resp 18   Ht 5' 9"  (1.753 m)   Wt 81.3 kg (179 lb 3.2 oz)   SpO2 94%   BMI 26.46 kg/m   BP 112/80 (BP Location: Left Arm)   Pulse 92   Temp 98.2 F (36.8 C) (Oral)   Resp 18   Ht 5' 9"  (1.753 m)   Wt 81.3 kg (179 lb 3.2 oz)   SpO2 94%   BMI 26.46 kg/m    Physical Exam  Constitution:  Alert, cooperative, no distress,  Psychiatric: Normal and stable mood and affect, cognition intact,   HEENT: Normocephalic, PERRL, otherwise with in Normal limits  Cardio vascular:  S1/S2, RRR, No murmure, No Rubs or Gallops  Chest/pulmonary: Clear to auscultation bilaterally, respirations unlabored  Chest symmetric Abdomen: Soft, non-tender, non-distended, bowel sounds,no masses, no organomegaly Muscular skeletal: Limited exam - in bed, able to move all 4 extremities, Normal strength,  Neuro: CNII-XII intact. , normal motor and sensation, reflexes intact  Extremities: No pitting edema lower extremities, +2 pulses  Skin: Dry, warm to touch, negative for any Rashes, No open wounds      Data Reviewed: I have personally reviewed following labs and imaging studies  CBC: Recent Labs  Lab 11/05/17 1212 11/06/17 0636 11/07/17 0505  WBC 13.2* 9.3 10.8*  HGB 13.1 12.6* 13.1  HCT 42.1 39.7 41.6  MCV 90.1 87.6 89.3  PLT 157 128* 174*   Basic Metabolic Panel: Recent Labs  Lab 11/06/17 0636 11/07/17 0505 11/08/17 0615 11/08/17 1350 11/09/17 0516  NA 139 141 141 138 140  K 4.0 3.6 2.9* 3.6 4.0  CL 101 97* 92* 94* 94*  CO2 28 30 34* 32 33*  GLUCOSE 161* 142* 168* 213* 205*  BUN 37* 42* 37* 38* 37*  CREATININE 1.46* 1.63* 1.53* 1.89* 1.71*  CALCIUM 8.8* 9.0 8.6* 8.5* 8.6*  MG 1.9 2.0  --   --   --   Coagulation Profile: Recent Labs  Lab 11/05/17 1310  INR 1.32   Cardiac Enzymes: Recent Labs  Lab 11/05/17 2103  TROPONINI  0.35*   BNP (last 3 results) No results for input(s): PROBNP in the last 8760 hours. HbA1C: No results for input(s): HGBA1C in the last 72 hours. CBG: Recent Labs  Lab 11/08/17 0736 11/08/17 1208 11/08/17 1641 11/08/17 2047 11/09/17 0723  GLUCAP 136* 155* 132* 186* 156*    Recent Results (from the past 240 hour(s))  Culture, Urine     Status: Abnormal   Collection Time: 11/05/17  4:24 PM  Result Value Ref Range Status   Specimen Description URINE, CLEAN CATCH  Final   Special Requests NONE  Final   Culture (A)  Final    <10,000 COLONIES/mL INSIGNIFICANT GROWTH Performed at [facility], [place] [address] [place], [place] [address]    Report Status 11/06/2017 FINAL  Final  MRSA PCR Screening     Status: None   Collection Time: 11/05/17  9:52 PM  Result Value Ref Range Status   MRSA  by PCR NEGATIVE NEGATIVE Final    Comment:        The GeneXpert MRSA Assay (FDA approved for NASAL specimens only), is one component of a comprehensive MRSA colonization surveillance program. It is not intended to diagnose MRSA infection nor to guide or monitor treatment for MRSA infections. Performed at [facility], [place] [address] [place],  [address]          Radiology Studies: No results found.  Scheduled Meds: . apixaban  5 mg Oral BID  . dronabinol  2.5 mg Oral BID AC  . famciclovir  250 mg Oral Daily  . furosemide  80 mg Intravenous BID  . gabapentin  800 mg Oral QHS  . insulin aspart  0-15 Units Subcutaneous TID WC  . insulin aspart  0-5 Units Subcutaneous QHS  . insulin aspart  4 Units Subcutaneous TID WC  . LORazepam  1 mg Oral Once  . magnesium oxide  400 mg Oral Daily  . Melatonin  6 mg Oral QHS  . metolazone  2.5 mg Oral BID  . pantoprazole  40 mg Oral Daily  . potassium chloride SA  40 mEq Oral BID  . rosuvastatin  10 mg Oral Daily  . sodium chloride flush  10-40 mL Intracatheter Q12H  . sodium chloride flush  3 mL Intravenous Q12H  .  spironolactone  25 mg Oral Daily  . traZODone  50 mg Oral QHS   Continuous Infusions: . sodium chloride    . amiodarone 30 mg/hr (11/09/17 0518)     LOS: 4 days    Time spent: 35 mins.More than 50% of that time was spent in counseling and/or coordination of care.   Deatra James, MD [facility] Pager [phone]  If 7PM-7AM, please contact night-coverage [email or web address] Password [facility] 11/09/2017, 11:58 AM

## 2017-11-09 NOTE — Progress Notes (Signed)
Advanced Heart Failure Rounding Note  PCP-Cardiologist: Glenetta Hew, MD   Subjective:    Remains on IV lasix. Weight down one more pound (8 pounds total).   Remains weak. SOB with any exertion. + orthopnea. CVP remains 15-16  Co-ox 76% this am (was 48% yesterday) K 4.0 Cr 1.6-> 1.5-> 1.7  Remains in AFL on IV amio and Eliquis. No bleeding    Objective:   Weight Range: 81.3 kg (179 lb 3.2 oz) Body mass index is 26.46 kg/m.   Vital Signs:   Temp:  [97.6 F (36.4 C)-98.1 F (36.7 C)] 97.9 F (36.6 C) (07/28 0800) Pulse Rate:  [53-104] 92 (07/28 0441) Resp:  [18-19] 18 (07/28 0800) BP: (94-114)/(60-96) 104/60 (07/28 0800) SpO2:  [90 %-98 %] 98 % (07/28 0800) Weight:  [81.3 kg (179 lb 3.2 oz)] 81.3 kg (179 lb 3.2 oz) (07/28 0440) Last BM Date: 11/05/17  Weight change: Filed Weights   11/07/17 0518 11/08/17 0442 11/09/17 0440  Weight: 82.9 kg (182 lb 12.8 oz) 81.9 kg (180 lb 9.6 oz) 81.3 kg (179 lb 3.2 oz)    Intake/Output:   Intake/Output Summary (Last 24 hours) at 11/09/2017 0832 Last data filed at 11/09/2017 0803 Gross per 24 hour  Intake 1282.73 ml  Output 3150 ml  Net -1867.27 ml      Physical Exam    General:  Weak appearing. No resp difficulty HEENT: normal Neck: supple. JVP to ear with prominent CV waves  Carotids 2+ bilat; no bruits. No lymphadenopathy or thryomegaly appreciated. Cor: PMI nondisplaced. Irregular rate & rhythm. 2/6 TR Lungs: clear Abdomen: soft, nontender, nondistended. No hepatosplenomegaly. No bruits or masses. Good bowel sounds. Extremities: no cyanosis, clubbing, rash, edema RUE PICC Neuro: alert & orientedx3, cranial nerves grossly intact. moves all 4 extremities w/o difficulty. Affect pleasant  Telemetry   Aflutter 90-105 bpm, personally reviewed.   EKG    No new tracings.    Labs    CBC Recent Labs    11/07/17 0505  WBC 10.8*  HGB 13.1  HCT 41.6  MCV 89.3  PLT 818*   Basic Metabolic Panel Recent Labs   11/07/17 0505  11/08/17 1350 11/09/17 0516  NA 141   < > 138 140  K 3.6   < > 3.6 4.0  CL 97*   < > 94* 94*  CO2 30   < > 32 33*  GLUCOSE 142*   < > 213* 205*  BUN 42*   < > 38* 37*  CREATININE 1.63*   < > 1.89* 1.71*  CALCIUM 9.0   < > 8.5* 8.6*  MG 2.0  --   --   --    < > = values in this interval not displayed.   Liver Function Tests No results for input(s): AST, ALT, ALKPHOS, BILITOT, PROT, ALBUMIN in the last 72 hours. No results for input(s): LIPASE, AMYLASE in the last 72 hours. Cardiac Enzymes No results for input(s): CKTOTAL, CKMB, CKMBINDEX, TROPONINI in the last 72 hours.  BNP: BNP (last 3 results) Recent Labs    06/25/17 1207 09/15/17 1401 11/05/17 1310  BNP 420.9* 393.0* 515.3*    ProBNP (last 3 results) No results for input(s): PROBNP in the last 8760 hours.   D-Dimer No results for input(s): DDIMER in the last 72 hours. Hemoglobin A1C No results for input(s): HGBA1C in the last 72 hours. Fasting Lipid Panel No results for input(s): CHOL, HDL, LDLCALC, TRIG, CHOLHDL, LDLDIRECT in the last 72 hours. Thyroid  Function Tests No results for input(s): TSH, T4TOTAL, T3FREE, THYROIDAB in the last 72 hours.  Invalid input(s): FREET3  Other results:   Imaging    No results found.   Medications:     Scheduled Medications: . apixaban  5 mg Oral BID  . dronabinol  2.5 mg Oral BID AC  . famciclovir  250 mg Oral Daily  . furosemide  80 mg Intravenous BID  . gabapentin  800 mg Oral QHS  . insulin aspart  0-15 Units Subcutaneous TID WC  . insulin aspart  0-5 Units Subcutaneous QHS  . insulin aspart  4 Units Subcutaneous TID WC  . LORazepam  1 mg Oral Once  . magnesium oxide  400 mg Oral Daily  . Melatonin  6 mg Oral QHS  . pantoprazole  40 mg Oral Daily  . potassium chloride SA  40 mEq Oral BID  . rosuvastatin  10 mg Oral Daily  . sodium chloride flush  10-40 mL Intracatheter Q12H  . sodium chloride flush  3 mL Intravenous Q12H  .  spironolactone  12.5 mg Oral Daily  . traZODone  50 mg Oral QHS    Infusions: . sodium chloride    . amiodarone 30 mg/hr (11/09/17 0518)    PRN Medications: sodium chloride, acetaminophen, LORazepam, ondansetron (ZOFRAN) IV, sodium chloride flush, sodium chloride flush, zolpidem    Patient Profile   Dillon Andrews is a 77 y.o. male with a hx of chronic combined CHF due to NICM, CKD III,presumedNASH(per pt, never proven),esophageal varices per chart(prior glass of wine occasionally but no heavy alcohol),HTN, HLD, DM, Barrett's esophagus, GERD, anemia, RBBB, TTR amyloidosis, and Kappa Light Chain myeloma.  Admitted to [facility] 11/05/17 in setting of new Afib with RVR.   Assessment/Plan   1. Acute on chronic combined HF with TTR amyloidosis (Wild Type) - NICM by cath at [facility] 09/2016 with EF 40-45%. Echo 06/2017: EF 25-30%, mild LAE, mild/mod RV dilation, mod/severe RA. - PYP scan strongly suggestive of TTR amyloid, SPEP with 0.8% m-spike, UPEP with Bence Jones Protein positive, kappa type. 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 - Now also diagnosed with multiple myeloma so suspect this may be AL amyloid but wouldn't expect PYP to be positive. She may have both TTR and AL amyloid but this would be unusual  -Has been approved for Tafamidis, but his co-pay is $1000, which he cannot afford. Will defer for now in setting of treatment for MM - Volume status remains elevated with CVP 15-16. Continue IV diuresis. Will add metolazone 2.5 bid today. May not get much lower with RV failure. Watch renal function closely - Co-ox 76% will repeat. (was 48% yesterday)  - Carvedilol on hold with marginal co-ox. On amio for AFL  - Increase spiro to 25 mg daily - No ARB/entresto with CKD  2. New onset AFlutter  - likely triggered HF decompensation  - Rates still slightly elevated. Continue amiodarone gtt. - On Eliquis 5 bid. No bleeding - CHA2DS2/VASc is at least 6. (CHF, HTN, DM, CAD (mild,  non-obstructive), and 2 for Age) - TEE/DC-CV tomorrow at 1p.  We discussed risk of bleeding with varices but I feel benefit outweighs risk.  3. CKD III, baseline ~1.6 -Creatinine up slightly today at 1.7. Continue to follow   4. Recurrent bilateral effusions - Somewhat increased from March on CXR 11/05/17. Follow with diuresis .   5. Kappa Lightchain Myeloma - Follows with Dr. Marin Olp who has seen this admission.  - Started  on Bortezomib7/18/18andRevlimid7/23/19. - Continue management per primary team. No change.   6. NASH with esophageal varices.  - Watch closely for bleeding with AC.  - Last EGD 06/21/2014 at [facility] for surveillance of Esophageal varices, Barrett's esophagus, and PMHx of Colon polyps. Showed two columns of small esophageal varices w/out stigmata, short segment Barrett's esophagus (without biopsies, due to presence of EVs), 5cm hiatal hernia, and hyperplastic small gastric nodules  7. DM2 - Per primary.   8. Hypokalemia. - will supp.  - repeat BMET at 1P.   Medication concerns reviewed with patient and pharmacy team. Barriers identified: None at this time.   Length of Stay: 4  Glori Bickers, MD  8:32 AM  Advanced Heart Failure Team Pager [phone] (M-F; 7a - 4p)  Please contact [facility] for night-coverage after hours (4p -7a ) and weekends on [email or web address]

## 2017-11-10 ENCOUNTER — Inpatient Hospital Stay (HOSPITAL_COMMUNITY): Payer: Medicare HMO | Admitting: Certified Registered Nurse Anesthetist

## 2017-11-10 ENCOUNTER — Encounter (HOSPITAL_COMMUNITY)
Admission: EM | Disposition: A | Discharge status: Home Health | Payer: Self-pay | Source: Home / Self Care | Attending: Internal Medicine

## 2017-11-10 ENCOUNTER — Inpatient Hospital Stay (HOSPITAL_COMMUNITY): Payer: Medicare HMO

## 2017-11-10 ENCOUNTER — Encounter (HOSPITAL_COMMUNITY): Payer: Medicare HMO

## 2017-11-10 ENCOUNTER — Encounter (HOSPITAL_COMMUNITY): Payer: Self-pay

## 2017-11-10 DIAGNOSIS — I340 Nonrheumatic mitral (valve) insufficiency: Secondary | ICD-10-CM

## 2017-11-10 DIAGNOSIS — I4892 Unspecified atrial flutter: Secondary | ICD-10-CM

## 2017-11-10 HISTORY — PX: TEE WITHOUT CARDIOVERSION: SHX5443

## 2017-11-10 HISTORY — PX: CARDIOVERSION: SHX1299

## 2017-11-10 LAB — BASIC METABOLIC PANEL
ANION GAP: 12 (ref 5–15)
BUN: 36 mg/dL — AB (ref 8–23)
CALCIUM: 8.9 mg/dL (ref 8.9–10.3)
CO2: 36 mmol/L — ABNORMAL HIGH (ref 22–32)
CREATININE: 1.61 mg/dL — AB (ref 0.61–1.24)
Chloride: 92 mmol/L — ABNORMAL LOW (ref 98–111)
GFR calc Af Amer: 46 mL/min — ABNORMAL LOW (ref >60)
GFR calc non Af Amer: 39 mL/min — ABNORMAL LOW (ref >60)
GLUCOSE: 122 mg/dL — AB (ref 70–99)
Potassium: 3.6 mmol/L (ref 3.5–5.1)
Sodium: 140 mmol/L (ref 135–145)

## 2017-11-10 LAB — GLUCOSE, CAPILLARY
GLUCOSE-CAPILLARY: 296 mg/dL — AB (ref 70–99)
Glucose-Capillary: 124 mg/dL — ABNORMAL HIGH (ref 70–99)
Glucose-Capillary: 136 mg/dL — ABNORMAL HIGH (ref 70–99)
Glucose-Capillary: 150 mg/dL — ABNORMAL HIGH (ref 70–99)

## 2017-11-10 LAB — COOXEMETRY PANEL
CARBOXYHEMOGLOBIN: 0.8 % (ref 0.5–1.5)
Methemoglobin: 1.4 % (ref 0.0–1.5)
O2 Saturation: 53.4 %
Total hemoglobin: 12.9 g/dL (ref 12.0–16.0)

## 2017-11-10 SURGERY — ECHOCARDIOGRAM, TRANSESOPHAGEAL
Anesthesia: General

## 2017-11-10 MED ORDER — MILRINONE LACTATE IN DEXTROSE 20-5 MG/100ML-% IV SOLN
0.1250 ug/kg/min | INTRAVENOUS | Status: DC
  Administered 2017-11-10 – 2017-11-11 (×2): 0.125 ug/kg/min via INTRAVENOUS
  Filled 2017-11-10 (×2): qty 100

## 2017-11-10 MED ORDER — LACTATED RINGERS IV SOLN
INTRAVENOUS | Status: DC
  Administered 2017-11-10: 1000 mL via INTRAVENOUS

## 2017-11-10 MED ORDER — PHENYLEPHRINE 40 MCG/ML (10ML) SYRINGE FOR IV PUSH (FOR BLOOD PRESSURE SUPPORT)
PREFILLED_SYRINGE | INTRAVENOUS | Status: DC | PRN
  Administered 2017-11-10: 120 ug via INTRAVENOUS
  Administered 2017-11-10: 40 ug via INTRAVENOUS

## 2017-11-10 MED ORDER — PROPOFOL 500 MG/50ML IV EMUL
INTRAVENOUS | Status: DC | PRN
  Administered 2017-11-10: 100 ug/kg/min via INTRAVENOUS

## 2017-11-10 MED ORDER — ONDANSETRON HCL 4 MG/2ML IJ SOLN: 4.0000 mg | Freq: Once | INTRAMUSCULAR | Status: DC | PRN

## 2017-11-10 MED ORDER — POTASSIUM CHLORIDE CRYS ER 20 MEQ PO TBCR
30.0000 meq | EXTENDED_RELEASE_TABLET | Freq: Once | ORAL | Status: AC
  Administered 2017-11-10: 30 meq via ORAL

## 2017-11-10 MED ORDER — LIDOCAINE 2% (20 MG/ML) 5 ML SYRINGE
INTRAMUSCULAR | Status: DC | PRN
  Administered 2017-11-10: 100 mg via INTRAVENOUS

## 2017-11-10 NOTE — Progress Notes (Addendum)
Advanced Heart Failure Rounding Note  PCP-Cardiologist: Glenetta Hew, MD   Subjective:    Brisk diuresis with IV lasix and metolazone yesterday. Weight down 3 more lbs (11 lbs total). Creatinine stable 1.6, K 3.6.   CVP 16-17. Coox dropping. Now down to 53%  Remains in AFL 90s on IV amio and Eliquis. No bleeding.   Denies CP, SOB but still feels weak. Orthopnea improving.  TEE/DCCV today at 1 pm.   Objective:   Weight Range: 176 lb (79.8 kg) Body mass index is 25.99 kg/m.   Vital Signs:   Temp:  [97.5 F (36.4 C)-98.2 F (36.8 C)] 97.5 F (36.4 C) (07/29 0511) Pulse Rate:  [79-100] 100 (07/29 0511) Resp:  [15-18] 16 (07/29 0511) BP: (98-112)/(70-82) 103/82 (07/29 0511) SpO2:  [91 %-97 %] 94 % (07/29 0511) Weight:  [176 lb (79.8 kg)] 176 lb (79.8 kg) (07/29 0511) Last BM Date: 11/09/17  Weight change: Filed Weights   11/08/17 0442 11/09/17 0440 11/10/17 0511  Weight: 180 lb 9.6 oz (81.9 kg) 179 lb 3.2 oz (81.3 kg) 176 lb (79.8 kg)    Intake/Output:   Intake/Output Summary (Last 24 hours) at 11/10/2017 0826 Last data filed at 11/10/2017 0500 Gross per 24 hour  Intake 736.57 ml  Output 3010 ml  Net -2273.43 ml      Physical Exam    CVP 16-17 General: Elderly appearing. No resp difficulty. HEENT: Normal anicteric Neck: Supple. JVP ~10-12. Carotids 2+ bilat; no bruits. No thyromegaly or nodule noted. Cor: PMI nondisplaced. IRR, 2/6 TR.  Lungs: CTAB, normal effort.No wheeze Abdomen: Soft, non-tender, non-distended, no HSM. No bruits or masses. +BS  Extremities: no cyanosis, clubbing, rash, edema RUE PICCNeuro: alert & oriented x 3, cranial nerves grossly intact. moves all 4 extremities w/o difficulty. Affect pleasant   Telemetry   Aflutter 90s. Personally reviewed.    EKG    No new tracings.   Labs    CBC No results for input(s): WBC, NEUTROABS, HGB, HCT, MCV, PLT in the last 72 hours. Basic Metabolic Panel Recent Labs    11/09/17 0516  11/10/17 0530  NA 140 140  K 4.0 3.6  CL 94* 92*  CO2 33* 36*  GLUCOSE 205* 122*  BUN 37* 36*  CREATININE 1.71* 1.61*  CALCIUM 8.6* 8.9   Liver Function Tests No results for input(s): AST, ALT, ALKPHOS, BILITOT, PROT, ALBUMIN in the last 72 hours. No results for input(s): LIPASE, AMYLASE in the last 72 hours. Cardiac Enzymes No results for input(s): CKTOTAL, CKMB, CKMBINDEX, TROPONINI in the last 72 hours.  BNP: BNP (last 3 results) Recent Labs    06/25/17 1207 09/15/17 1401 11/05/17 1310  BNP 420.9* 393.0* 515.3*    ProBNP (last 3 results) No results for input(s): PROBNP in the last 8760 hours.   D-Dimer No results for input(s): DDIMER in the last 72 hours. Hemoglobin A1C No results for input(s): HGBA1C in the last 72 hours. Fasting Lipid Panel No results for input(s): CHOL, HDL, LDLCALC, TRIG, CHOLHDL, LDLDIRECT in the last 72 hours. Thyroid Function Tests No results for input(s): TSH, T4TOTAL, T3FREE, THYROIDAB in the last 72 hours.  Invalid input(s): FREET3  Other results:   Imaging    No results found.   Medications:     Scheduled Medications: . apixaban  5 mg Oral BID  . dronabinol  2.5 mg Oral BID AC  . famciclovir  250 mg Oral Daily  . furosemide  80 mg Intravenous BID  . gabapentin  800 mg Oral QHS  . insulin aspart  0-15 Units Subcutaneous TID WC  . insulin aspart  0-5 Units Subcutaneous QHS  . insulin aspart  4 Units Subcutaneous TID WC  . LORazepam  1 mg Oral Once  . magnesium oxide  400 mg Oral Daily  . Melatonin  6 mg Oral QHS  . pantoprazole  40 mg Oral Daily  . potassium chloride SA  40 mEq Oral BID  . rosuvastatin  10 mg Oral Daily  . sodium chloride flush  10-40 mL Intracatheter Q12H  . sodium chloride flush  3 mL Intravenous Q12H  . spironolactone  25 mg Oral Daily  . traZODone  50 mg Oral QHS    Infusions: . sodium chloride    . amiodarone 30 mg/hr (11/10/17 0705)    PRN Medications: sodium chloride, acetaminophen,  LORazepam, ondansetron (ZOFRAN) IV, sodium chloride flush, sodium chloride flush, zolpidem    Patient Profile   Dillon Andrews is a 77 y.o. male with a hx of chronic combined CHF due to NICM, CKD III,presumedNASH(per pt, never proven),esophageal varices per chart(prior glass of wine occasionally but no heavy alcohol),HTN, HLD, DM, Barrett's esophagus, GERD, anemia, RBBB, TTR amyloidosis, and Kappa Light Chain myeloma.  Admitted to [facility] 11/05/17 in setting of new Afib with RVR.   Assessment/Plan   1. Acute on chronic combined HF with TTR amyloidosis (Wild Type) - NICM by cath at [facility] 09/2016 with EF 40-45%. Echo 06/2017: EF 25-30%, mild LAE, mild/mod RV dilation, mod/severe RA. - PYP scan strongly suggestive of TTR amyloid, SPEP with 0.8% m-spike, UPEP with Bence Jones Protein positive, kappa type. 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 - Now also diagnosed with multiple myeloma so suspect this may be AL amyloid but wouldn't expect PYP to be positive. She may have both TTR and AL amyloid but this would be unusual  -Has been approved for Tafamidis, but his co-pay is $1000, which he cannot afford. Will defer for now in setting of treatment for MM - Volume status remains elevated with CVP 16-17 Continue IV diuresis. Hold off on metolazone this am. May not get much lower with RV failure. Watch renal function closely   - Co-ox 53%. Start milrinone 0.125 mcg/kg/min to support through DCCV today. Discussed with MD.  - Carvedilol on hold with marginal co-ox. On amio for AFL  - Continue spiro 25 mg daily - No ARB/entresto with CKD  2. New onset AFlutter  - likely triggered HF decompensation  - Rates still slightly elevated. Continue amiodarone gtt. - On Eliquis 5 bid. Denies bleeding.  - CHA2DS2/VASc is at least 6. (CHF, HTN, DM, CAD (mild, non-obstructive), and 2 for Age) - TEE/DC-CV today at 1p.  We discussed risk of bleeding with varices but I feel benefit outweighs risk.  3.  CKD III, baseline ~1.6 -Creatinine stable 1.6 this am. Monitor daily BMET.   4. Recurrent bilateral effusions - Somewhat increased from March on CXR 11/05/17. Follow with diuresis. No change.   5. Kappa Lightchain Myeloma - Follows with Dr. Marin Olp who has seen this admission.  - Started on Bortezomib7/18/18andRevlimid7/23/19. - Continue management per primary team. No change.   6. NASH with esophageal varices.  - Watch closely for bleeding with AC. No change.  - Last EGD 06/21/2014 at [facility] for surveillance of Esophageal varices, Barrett's esophagus, and PMHx of Colon polyps. Showed two columns of small esophageal varices w/out stigmata, short segment Barrett's esophagus (without biopsies, due to presence of EVs),  5cm hiatal hernia, and hyperplastic small gastric nodules  7. DM2 - Per primary.   8. Hypokalemia. - K 3.6. Supp.   Medication concerns reviewed with patient and pharmacy team. Barriers identified: None at this time.   Length of Stay: [place], NP  8:26 AM  Advanced Heart Failure Team Pager [phone] (M-F; 7a - 4p)  Please contact [facility] for night-coverage after hours (4p -7a ) and weekends on [email or web address]  Patient seen and examined with the above-signed Advanced Practice Provider and/or Housestaff. I personally reviewed laboratory data, imaging studies and relevant notes. I independently examined the patient and formulated the important aspects of the plan. I have edited the note to reflect any of my changes or salient points. I have personally discussed the plan with the patient and/or family.  Remains very tenuous. Diuresing steadily but CVP still remains ~15. Likely due to RV failure. Remains in AFL on amio gtt. Tolerating Eliquis. Co-ox continues to drop. Now 53% suggestive of low output. Will start milrinone to support TEE and DC-CV today. Hopefully can wean off soon. Supp K.   Glori Bickers, MD  12:55 PM

## 2017-11-10 NOTE — Anesthesia Preprocedure Evaluation (Addendum)
Anesthesia Evaluation  Patient identified by MRN, date of birth, ID band Patient awake    Reviewed: Allergy & Precautions, NPO status , Patient's Chart, lab work & pertinent test results, reviewed documented beta blocker date and time   History of Anesthesia Complications (+) Family history of anesthesia reaction  Airway Mallampati: III  TM Distance: >3 FB Neck ROM: Full    Dental no notable dental hx. (+) Teeth Intact   Pulmonary pneumonia, resolved, former smoker,    Pulmonary exam normal breath sounds clear to auscultation       Cardiovascular hypertension, Pt. on medications and Pt. on home beta blockers + Peripheral Vascular Disease and +CHF  + dysrhythmias  Rhythm:Irregular Rate:Normal  Cardiac amyloidosis Non ischemic CM LVEF 25-30% Echo 11/05/17-Left ventricle: The cavity size was normal. Wall thickness was  increased in a pattern of severe LVH. Systolic function was severely reduced. The estimated ejection fraction was in the range of 25% to 30%. Mild hypokinesis of the basalanteroseptal  myocardium. - Aortic valve: Mildly calcified annulus. Trileaflet; normal   thickness, mildly calcified leaflets. - Left atrium: The atrium was mildly dilated. - Right ventricle: The cavity size was mildly to moderately   dilated. Wall thickness was normal. - Right atrium: The atrium was moderately to severely dilated. 12 lead EKG- A. Fib RVR, RBBB pattern, inf and anterior infarcts   Neuro/Psych negative neurological ROS  negative psych ROS   GI/Hepatic Neg liver ROS, GERD  Medicated and Controlled,Hx/o Barrett's esophagus   Endo/Other  diabetes, Well Controlled, Type 2, Oral Hypoglycemic AgentsHyperlipidemia  Renal/GU Renal InsufficiencyRenal disease  negative genitourinary   Musculoskeletal  (+) Arthritis , Osteoarthritis,    Abdominal Normal abdominal exam  (+)   Peds  Hematology  (+) anemia , Kappa Light chain myeloma    Anesthesia Other Findings   Reproductive/Obstetrics                            Anesthesia Physical Anesthesia Plan  ASA: III  Anesthesia Plan: General   Post-op Pain Management:    Induction: Intravenous  PONV Risk Score and Plan: Ondansetron, Propofol infusion and Treatment may vary due to age or medical condition  Airway Management Planned: Mask  Additional Equipment:   Intra-op Plan:   Post-operative Plan:   Informed Consent: I have reviewed the patients History and Physical, chart, labs and discussed the procedure including the risks, benefits and alternatives for the proposed anesthesia with the patient or authorized representative who has indicated his/her understanding and acceptance.   Dental advisory given  Plan Discussed with: CRNA and Surgeon  Anesthesia Plan Comments:         Anesthesia Quick Evaluation

## 2017-11-10 NOTE — CV Procedure (Signed)
   TRANSESOPHAGEAL ECHOCARDIOGRAM GUIDED DIRECT CURRENT CARDIOVERSION  NAME:  Dillon Andrews   MRN: [id number] DOB:  1940-07-09   ADMIT DATE: 11/05/2017  INDICATIONS: Atrial flutter  PROCEDURE:   Informed consent was obtained prior to the procedure. The risks, benefits and alternatives for the procedure were discussed and the patient comprehended these risks.  Risks include, but are not limited to, cough, sore throat, vomiting, nausea, somnolence, esophageal and stomach trauma or perforation, bleeding, low blood pressure, aspiration, pneumonia, infection, trauma to the teeth and death.    After a procedural time-out, the oropharynx was anesthetized and the patient was sedated by the anesthesia service. The transesophageal probe was inserted in the esophagus and stomach without difficulty and multiple views were obtained.   FINDINGS:  LEFT VENTRICLE: EF = 10% global HK with smoke  RIGHT VENTRICLE: Severely reduced.   LEFT ATRIUM: Moderately dilated 4.8 cm  LEFT ATRIAL APPENDAGE: No clot  RIGHT ATRIUM: Dilated. +PICC  AORTIC VALVE:  Trileaflet. No AI/AS  MITRAL VALVE:   Normal. Mild MR  TRICUSPID VALVE: Normal Moderate TR  PULMONIC VALVE: Trivial PR  INTERATRIAL SEPTUM: No PFO  PERICARDIUM: No effusion  DESCENDING AORTA: Mild plaque  CARDIOVERSION:     Indications:  Atrial Flutter  Procedure Details:  Once the TEE was complete, the patient had the defibrillator pads placed in the anterior and posterior position. Once an appropriate level of sedation was achieved, the patient received a single biphasic, synchronized 150J shock with prompt conversion to sinus rhythm. No apparent complications.   Glori Bickers, MD  2:04 PM

## 2017-11-10 NOTE — Progress Notes (Signed)
PT Cancellation Note  Patient Details Name: KYROLLOS CORDELL MRN: [id number] DOB: 1941/02/23   Cancelled Treatment:    Reason Eval/Treat Not Completed: Patient at procedure or test/unavailable. Will follow-up for PT treatment as schedule permits.  Mabeline Caras, PT, DPT Acute Rehab Services  Pager: [place] 11/10/2017, 12:18 PM

## 2017-11-10 NOTE — Progress Notes (Signed)
PROGRESS NOTE    Dillon Andrews  RWE:[id number] DOB: 06/19/1940 DOA: 11/05/2017 PCP: [name], MD    Subjective:  Was seen and examined this morning, stable no acute distress, still complaining shortness of breath, no chest pain. Anticipating TEE- cardioversion today. Still on amiodarone gtt. heart rate ranging 79-100. No issues overnight. ------------------------------------------------------------------------------------------------------------------------------------------------  Brief Narrative: Patient is a 77 year old male with past medical history of liver cirrhosis with esophageal varices, combined systolic and diastolic CHF with ejection fraction of 25 to 30%, nonischemic cardiomyopathy, pulmonary hypertension, cardiac amyloidosis, CKD stage III, kappa light chain myeloma on chemotherapy was brought to the emergency department due to shortness of breath.  Patient was found to be in A. fib with RVR with hypotension on presentation.  Cardiology was consulted.  Started on amiodarone drip.  Also started on heparin for new onset A. fib.  Heart failure team is following now.Plan is to do DC cardioversion as per cardiology.  Assessment & Plan:   Principal Problem:   Atrial fibrillation with RVR ([place]), new onset Active Problems:   CKD (chronic kidney disease) stage 3, GFR 30-59 ml/min (HCC), baseline Cr 1.5   DM2 (diabetes mellitus, type 2) (HCC)   Acute on chronic combined systolic and diastolic CHF (congestive heart failure) (HCC)   Bilateral pleural effusion   Esophageal varices in cirrhosis (HCC)   SOB (shortness of breath)   Non-ischemic cardiomyopathy ([place])   Other secondary pulmonary hypertension (HCC)   RBBB   Cardiac amyloidosis (HCC)   Kappa light chain myeloma (HCC)   Leukocytosis   Elevated troponin   Elevated brain natriuretic peptide (BNP) level, acute on chronic   Hypotension   Palliative care by specialist  New onset A. fib with RVR:  -Stable,  denying of having any chest pain, still having some shortness of breath especially with exertion, heart rate ranging between 79 - 100, high 90s this morning upon examination.  -Started on Eliquis 5 mg p.o. twice daily due toCHADS2VASC score> 6 -Going, anticipating transesophageal cardioversion today.    s/dCHF:  -Stable -Acute on chronic combined heart failure with TKR amyloidosis ()wild-type) -On diuretics IV Lasix, good urine output, loss approximately 8 pounds since admission -Echo 07/01/2017, ejection fraction 25-30%, associated with amyloidosis, -Is been approved for Tafamidis  Not on ARB or Entresto due to CKD.   Cirrhosis with esophageal varices:  -Stable no signs of bleeding, monitoring closely as patient is started on chronic anticoagulation, Eliquis -Last EGD 06/21/2014 at [facility] reporting esophageal varices, Barrett's esophagus.   Likely  NASH.  Follow-up with GI as an outpatient.  We will monitor him closely for bleeding especially with anticoagulation.  Kappa light chain myeloma:  -Patient has been diagnosed with multiple myeloma-suspected this may be a AL amyeloid Follows with Dr. Marin Olp.  Just had chemotherapy  few days ago.  Was started Bortezomib on 10/30/16 and Revlimid on 11/04/17  CKD stage III:  -Baseline creatinine 1.6 stable -Monitoring BUN/creatinine   Diabetes mellitus type 2:  -SSI We will continue monitor his blood sugars.  Continue current regimen.   DVT prophylaxis: Eliquis Code Status: Full Family Communication: Daughter present at the bedside Disposition Plan: To home in 1-2 days.  Waiting for stabilization of the heart rate.   Consultants: Heme-onc /Cardiology  Procedures:None  Antimicrobials:None   Objective: Vitals:   11/09/17 1558 11/09/17 2019 11/10/17 0511 11/10/17 1218  BP: 98/75 105/70 103/82 110/80  Pulse: 79 89 100 100  Resp: 18 15 16 18   Temp: 97.7 F (  36.5 C) 98 F (36.7 C) (!) 97.5 F (36.4 C) 98 F (36.7 C)    TempSrc: Oral Oral Oral Oral  SpO2: 97% 91% 94% 93%  Weight:   79.8 kg (176 lb) 79.8 kg (176 lb)  Height:    5' 9"  (1.753 m)    Intake/Output Summary (Last 24 hours) at 11/10/2017 1227 Last data filed at 11/10/2017 0500 Gross per 24 hour  Intake 736.57 ml  Output 2750 ml  Net -2013.43 ml   Filed Weights   11/09/17 0440 11/10/17 0511 11/10/17 1218  Weight: 81.3 kg (179 lb 3.2 oz) 79.8 kg (176 lb) 79.8 kg (176 lb)  BP 110/80   Pulse 100   Temp 98 F (36.7 C) (Oral)   Resp 18   Ht 5' 9"  (1.753 m)   Wt 79.8 kg (176 lb)   SpO2 93%   BMI 25.99 kg/m    Physical Exam  Constitution:  Alert, cooperative, no distress,  Psychiatric: Normal and stable mood and affect, cognition intact,   HEENT: Normocephalic, PERRL, otherwise with in Normal limits  Cardio vascular:  S1/S2, RRR, No murmure, No Rubs or Gallops  Chest/pulmonary: Clear to auscultation bilaterally, respirations unlabored  Chest symmetric Abdomen: Soft, non-tender, non-distended, bowel sounds,no masses, no organomegaly Muscular skeletal: Limited exam - in bed, able to move all 4 extremities, Normal strength,  Neuro: CNII-XII intact. , normal motor and sensation, reflexes intact  Extremities: No pitting edema lower extremities, +2 pulses  Skin: Dry, warm to touch, negative for any Rashes, No open wounds        Data Reviewed: I have personally reviewed following labs and imaging studies  CBC: Recent Labs  Lab 11/05/17 1212 11/06/17 0636 11/07/17 0505  WBC 13.2* 9.3 10.8*  HGB 13.1 12.6* 13.1  HCT 42.1 39.7 41.6  MCV 90.1 87.6 89.3  PLT 157 128* 355*   Basic Metabolic Panel: Recent Labs  Lab 11/06/17 0636 11/07/17 0505 11/08/17 0615 11/08/17 1350 11/09/17 0516 11/10/17 0530  NA 139 141 141 138 140 140  K 4.0 3.6 2.9* 3.6 4.0 3.6  CL 101 97* 92* 94* 94* 92*  CO2 28 30 34* 32 33* 36*  GLUCOSE 161* 142* 168* 213* 205* 122*  BUN 37* 42* 37* 38* 37* 36*  CREATININE 1.46* 1.63* 1.53* 1.89* 1.71* 1.61*   CALCIUM 8.8* 9.0 8.6* 8.5* 8.6* 8.9  MG 1.9 2.0  --   --   --   --   Coagulation Profile: Recent Labs  Lab 11/05/17 1310  INR 1.32   Cardiac Enzymes: Recent Labs  Lab 11/05/17 2103  TROPONINI 0.35*   BNP (last 3 results) No results for input(s): PROBNP in the last 8760 hours. HbA1C: No results for input(s): HGBA1C in the last 72 hours. CBG: Recent Labs  Lab 11/09/17 1144 11/09/17 1701 11/09/17 2052 11/10/17 0759 11/10/17 1150  GLUCAP 167* 117* 195* 124* 136*    Recent Results (from the past 240 hour(s))  Culture, Urine     Status: Abnormal   Collection Time: 11/05/17  4:24 PM  Result Value Ref Range Status   Specimen Description URINE, CLEAN CATCH  Final   Special Requests NONE  Final   Culture (A)  Final    <10,000 COLONIES/mL INSIGNIFICANT GROWTH Performed at [facility], [place] [address], [place], [place] [address]    Report Status 11/06/2017 FINAL  Final  MRSA PCR Screening     Status: None   Collection Time: 11/05/17  9:52 PM  Result  Value Ref Range Status   MRSA by PCR NEGATIVE NEGATIVE Final    Comment:        The GeneXpert MRSA Assay (FDA approved for NASAL specimens only), is one component of a comprehensive MRSA colonization surveillance program. It is not intended to diagnose MRSA infection nor to guide or monitor treatment for MRSA infections. Performed at [facility], [place] [address] [place], [place] [address]          Radiology Studies: No results found.  Scheduled Meds: . [MAR Hold] apixaban  5 mg Oral BID  . [MAR Hold] dronabinol  2.5 mg Oral BID AC  . [MAR Hold] famciclovir  250 mg Oral Daily  . [MAR Hold] furosemide  80 mg Intravenous BID  . [MAR Hold] gabapentin  800 mg Oral QHS  . [MAR Hold] insulin aspart  0-15 Units Subcutaneous TID WC  . [MAR Hold] insulin aspart  0-5 Units Subcutaneous QHS  . [MAR Hold] insulin aspart  4 Units Subcutaneous TID WC  . [MAR Hold] LORazepam  1 mg Oral Once  . [MAR Hold]  magnesium oxide  400 mg Oral Daily  . [MAR Hold] Melatonin  6 mg Oral QHS  . [MAR Hold] pantoprazole  40 mg Oral Daily  . [MAR Hold] potassium chloride  30 mEq Oral Once  . [MAR Hold] potassium chloride SA  40 mEq Oral BID  . [MAR Hold] rosuvastatin  10 mg Oral Daily  . [MAR Hold] sodium chloride flush  10-40 mL Intracatheter Q12H  . [MAR Hold] sodium chloride flush  3 mL Intravenous Q12H  . [MAR Hold] spironolactone  25 mg Oral Daily  . [MAR Hold] traZODone  50 mg Oral QHS   Continuous Infusions: . [MAR Hold] sodium chloride    . amiodarone 30 mg/hr (11/10/17 0705)  . lactated ringers    . milrinone       LOS: 5 days    Time spent: 35 mins.More than 50% of that time was spent in counseling and/or coordination of care.   Deatra James, MD [facility] Pager [phone]  If 7PM-7AM, please contact night-coverage [email or web address] Password [facility] 11/10/2017, 12:27 PM

## 2017-11-10 NOTE — Anesthesia Postprocedure Evaluation (Signed)
Anesthesia Post Note  Patient: Dillon Andrews  Procedure(s) Performed: TRANSESOPHAGEAL ECHOCARDIOGRAM (TEE) (N/A ) CARDIOVERSION (N/A )     Patient location during evaluation: PACU Anesthesia Type: General Level of consciousness: awake and alert and oriented Pain management: pain level controlled Vital Signs Assessment: post-procedure vital signs reviewed and stable Respiratory status: spontaneous breathing, nonlabored ventilation, respiratory function stable and patient connected to nasal cannula oxygen Cardiovascular status: stable and blood pressure returned to baseline Postop Assessment: no apparent nausea or vomiting Anesthetic complications: no    Last Vitals:  Vitals:   11/10/17 1218 11/10/17 1345  BP: 110/80 97/60  Pulse: 100 77  Resp: 18 20  Temp: 36.7 C 36.5 C  SpO2: 93% 98%    Last Pain:  Vitals:   11/10/17 1345  TempSrc: Oral  PainSc: 0-No pain                 Gloria Lambertson A.

## 2017-11-10 NOTE — Interval H&P Note (Signed)
History and Physical Interval Note:  11/10/2017 1:00 PM  Dillon Andrews  has presented today for surgery, with the diagnosis of atrial flutter  The various methods of treatment have been discussed with the patient and family. After consideration of risks, benefits and other options for treatment, the patient has consented to  Procedure(s): TRANSESOPHAGEAL ECHOCARDIOGRAM (TEE) (N/A) CARDIOVERSION (N/A) as a surgical intervention .  The patient's history has been reviewed, patient examined, no change in status, stable for surgery.  I have reviewed the patient's chart and labs.  Questions were answered to the patient's satisfaction.     Daniel Bensimhon

## 2017-11-10 NOTE — Transfer of Care (Signed)
Immediate Anesthesia Transfer of Care Note  Patient: Dillon Andrews  Procedure(s) Performed: TRANSESOPHAGEAL ECHOCARDIOGRAM (TEE) (N/A ) CARDIOVERSION (N/A )  Patient Location: Endoscopy Unit  Anesthesia Type:MAC  Level of Consciousness: drowsy  Airway & Oxygen Therapy: Patient Spontanous Breathing and Patient connected to nasal cannula oxygen  Post-op Assessment: Report given to RN and Post -op Vital signs reviewed and stable  Post vital signs: Reviewed and stable  Last Vitals:  Vitals Value Taken Time  BP 97/60 11/10/2017  1:45 PM  Temp    Pulse 73 11/10/2017  1:47 PM  Resp 22 11/10/2017  1:47 PM  SpO2 96 % 11/10/2017  1:47 PM  Vitals shown include unvalidated device data.  Last Pain:  Vitals:   11/10/17 1218  TempSrc: Oral  PainSc: 0-No pain      Patients Stated Pain Goal: 0 (09/47/09 6283)  Complications: No apparent anesthesia complications

## 2017-11-10 NOTE — H&P (View-Only) (Signed)
  Advanced Heart Failure Rounding Note  PCP-Cardiologist: David Harding, MD   Subjective:    Brisk diuresis with IV lasix and metolazone yesterday. Weight down 3 more lbs (11 lbs total). Creatinine stable 1.6, K 3.6.   CVP 16-17. Coox dropping. Now down to 53%  Remains in AFL 90s on IV amio and Eliquis. No bleeding.   Denies CP, SOB but still feels weak. Orthopnea improving.  TEE/DCCV today at 1 pm.   Objective:   Weight Range: 176 lb (79.8 kg) Body mass index is 25.99 kg/m.   Vital Signs:   Temp:  [97.5 F (36.4 C)-98.2 F (36.8 C)] 97.5 F (36.4 C) (07/29 0511) Pulse Rate:  [79-100] 100 (07/29 0511) Resp:  [15-18] 16 (07/29 0511) BP: (98-112)/(70-82) 103/82 (07/29 0511) SpO2:  [91 %-97 %] 94 % (07/29 0511) Weight:  [176 lb (79.8 kg)] 176 lb (79.8 kg) (07/29 0511) Last BM Date: 11/09/17  Weight change: Filed Weights   11/08/17 0442 11/09/17 0440 11/10/17 0511  Weight: 180 lb 9.6 oz (81.9 kg) 179 lb 3.2 oz (81.3 kg) 176 lb (79.8 kg)    Intake/Output:   Intake/Output Summary (Last 24 hours) at 11/10/2017 0826 Last data filed at 11/10/2017 0500 Gross per 24 hour  Intake 736.57 ml  Output 3010 ml  Net -2273.43 ml      Physical Exam    CVP 16-17 General: Elderly appearing. No resp difficulty. HEENT: Normal anicteric Neck: Supple. JVP ~10-12. Carotids 2+ bilat; no bruits. No thyromegaly or nodule noted. Cor: PMI nondisplaced. IRR, 2/6 TR.  Lungs: CTAB, normal effort.No wheeze Abdomen: Soft, non-tender, non-distended, no HSM. No bruits or masses. +BS  Extremities: no cyanosis, clubbing, rash, edema RUE PICCNeuro: alert & oriented x 3, cranial nerves grossly intact. moves all 4 extremities w/o difficulty. Affect pleasant   Telemetry   Aflutter 90s. Personally reviewed.    EKG    No new tracings.   Labs    CBC No results for input(s): WBC, NEUTROABS, HGB, HCT, MCV, PLT in the last 72 hours. Basic Metabolic Panel Recent Labs    11/09/17 0516  11/10/17 0530  NA 140 140  K 4.0 3.6  CL 94* 92*  CO2 33* 36*  GLUCOSE 205* 122*  BUN 37* 36*  CREATININE 1.71* 1.61*  CALCIUM 8.6* 8.9   Liver Function Tests No results for input(s): AST, ALT, ALKPHOS, BILITOT, PROT, ALBUMIN in the last 72 hours. No results for input(s): LIPASE, AMYLASE in the last 72 hours. Cardiac Enzymes No results for input(s): CKTOTAL, CKMB, CKMBINDEX, TROPONINI in the last 72 hours.  BNP: BNP (last 3 results) Recent Labs    06/25/17 1207 09/15/17 1401 11/05/17 1310  BNP 420.9* 393.0* 515.3*    ProBNP (last 3 results) No results for input(s): PROBNP in the last 8760 hours.   D-Dimer No results for input(s): DDIMER in the last 72 hours. Hemoglobin A1C No results for input(s): HGBA1C in the last 72 hours. Fasting Lipid Panel No results for input(s): CHOL, HDL, LDLCALC, TRIG, CHOLHDL, LDLDIRECT in the last 72 hours. Thyroid Function Tests No results for input(s): TSH, T4TOTAL, T3FREE, THYROIDAB in the last 72 hours.  Invalid input(s): FREET3  Other results:   Imaging    No results found.   Medications:     Scheduled Medications: . apixaban  5 mg Oral BID  . dronabinol  2.5 mg Oral BID AC  . famciclovir  250 mg Oral Daily  . furosemide  80 mg Intravenous BID  . gabapentin    800 mg Oral QHS  . insulin aspart  0-15 Units Subcutaneous TID WC  . insulin aspart  0-5 Units Subcutaneous QHS  . insulin aspart  4 Units Subcutaneous TID WC  . LORazepam  1 mg Oral Once  . magnesium oxide  400 mg Oral Daily  . Melatonin  6 mg Oral QHS  . pantoprazole  40 mg Oral Daily  . potassium chloride SA  40 mEq Oral BID  . rosuvastatin  10 mg Oral Daily  . sodium chloride flush  10-40 mL Intracatheter Q12H  . sodium chloride flush  3 mL Intravenous Q12H  . spironolactone  25 mg Oral Daily  . traZODone  50 mg Oral QHS    Infusions: . sodium chloride    . amiodarone 30 mg/hr (11/10/17 0705)    PRN Medications: sodium chloride, acetaminophen,  LORazepam, ondansetron (ZOFRAN) IV, sodium chloride flush, sodium chloride flush, zolpidem    Patient Profile   Avyaan W Centanni is a 77 y.o. male with a hx of chronic combined CHF due to NICM, CKD III,presumedNASH(per pt, never proven),esophageal varices per chart(prior glass of wine occasionally but no heavy alcohol),HTN, HLD, DM, Barrett's esophagus, GERD, anemia, RBBB, TTR amyloidosis, and Kappa Light Chain myeloma.  Admitted to [facility] 11/05/17 in setting of new Afib with RVR.   Assessment/Plan   1. Acute on chronic combined HF with TTR amyloidosis (Wild Type) - NICM by cath at [facility] 09/2016 with EF 40-45%. Echo 06/2017: EF 25-30%, mild LAE, mild/mod RV dilation, mod/severe RA. - PYP scan strongly suggestive of TTR amyloid, SPEP with 0.8% m-spike, UPEP with Bence Jones Protein positive, kappa type. 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 - Now also diagnosed with multiple myeloma so suspect this may be AL amyloid but wouldn't expect PYP to be positive. She may have both TTR and AL amyloid but this would be unusual  -Has been approved for Tafamidis, but his co-pay is $1000, which he cannot afford. Will defer for now in setting of treatment for MM - Volume status remains elevated with CVP 16-17 Continue IV diuresis. Hold off on metolazone this am. May not get much lower with RV failure. Watch renal function closely   - Co-ox 53%. Start milrinone 0.125 mcg/kg/min to support through DCCV today. Discussed with MD.  - Carvedilol on hold with marginal co-ox. On amio for AFL  - Continue spiro 25 mg daily - No ARB/entresto with CKD  2. New onset AFlutter  - likely triggered HF decompensation  - Rates still slightly elevated. Continue amiodarone gtt. - On Eliquis 5 bid. Denies bleeding.  - CHA2DS2/VASc is at least 6. (CHF, HTN, DM, CAD (mild, non-obstructive), and 2 for Age) - TEE/DC-CV today at 1p.  We discussed risk of bleeding with varices but I feel benefit outweighs risk.  3.  CKD III, baseline ~1.6 -Creatinine stable 1.6 this am. Monitor daily BMET.   4. Recurrent bilateral effusions - Somewhat increased from March on CXR 11/05/17. Follow with diuresis. No change.   5. Kappa Lightchain Myeloma - Follows with Dr. Ennever who has seen this admission.  - Started on Bortezomib7/18/18andRevlimid7/23/19. - Continue management per primary team. No change.   6. NASH with esophageal varices.  - Watch closely for bleeding with AC. No change.  - Last EGD 06/21/2014 at [facility] for surveillance of Esophageal varices, Barrett's esophagus, and PMHx of Colon polyps. Showed two columns of small esophageal varices w/out stigmata, short segment Barrett's esophagus (without biopsies, due to presence of EVs),   5cm hiatal hernia, and hyperplastic small gastric nodules  7. DM2 - Per primary.   8. Hypokalemia. - K 3.6. Supp.   Medication concerns reviewed with patient and pharmacy team. Barriers identified: None at this time.   Length of Stay: 5  Ashley M Smith, NP  8:26 AM  Advanced Heart Failure Team Pager [phone] (M-F; 7a - 4p)  Please contact [facility] for night-coverage after hours (4p -7a ) and weekends on [email or web address]  Patient seen and examined with the above-signed Advanced Practice Provider and/or Housestaff. I personally reviewed laboratory data, imaging studies and relevant notes. I independently examined the patient and formulated the important aspects of the plan. I have edited the note to reflect any of my changes or salient points. I have personally discussed the plan with the patient and/or family.  Remains very tenuous. Diuresing steadily but CVP still remains ~15. Likely due to RV failure. Remains in AFL on amio gtt. Tolerating Eliquis. Co-ox continues to drop. Now 53% suggestive of low output. Will start milrinone to support TEE and DC-CV today. Hopefully can wean off soon. Supp K.   Solmon Bohr, MD  12:55 PM       

## 2017-11-11 ENCOUNTER — Encounter (HOSPITAL_COMMUNITY): Payer: Self-pay | Admitting: Internal Medicine

## 2017-11-11 ENCOUNTER — Inpatient Hospital Stay (HOSPITAL_COMMUNITY): Payer: Medicare HMO

## 2017-11-11 LAB — CBC
HCT: 39.4 % (ref 39.0–52.0)
Hemoglobin: 12.3 g/dL — ABNORMAL LOW (ref 13.0–17.0)
MCH: 27.9 pg (ref 26.0–34.0)
MCHC: 31.2 g/dL (ref 30.0–36.0)
MCV: 89.3 fL (ref 78.0–100.0)
PLATELETS: 170 10*3/uL (ref 150–400)
RBC: 4.41 MIL/uL (ref 4.22–5.81)
RDW: 18.2 % — AB (ref 11.5–15.5)
WBC: 11.6 10*3/uL — ABNORMAL HIGH (ref 4.0–10.5)

## 2017-11-11 LAB — BASIC METABOLIC PANEL
Anion gap: 11 (ref 5–15)
BUN: 40 mg/dL — AB (ref 8–23)
CALCIUM: 8.8 mg/dL — AB (ref 8.9–10.3)
CO2: 36 mmol/L — ABNORMAL HIGH (ref 22–32)
CREATININE: 1.69 mg/dL — AB (ref 0.61–1.24)
Chloride: 92 mmol/L — ABNORMAL LOW (ref 98–111)
GFR calc Af Amer: 43 mL/min — ABNORMAL LOW (ref >60)
GFR, EST NON AFRICAN AMERICAN: 37 mL/min — AB (ref >60)
GLUCOSE: 118 mg/dL — AB (ref 70–99)
POTASSIUM: 3.8 mmol/L (ref 3.5–5.1)
SODIUM: 139 mmol/L (ref 135–145)

## 2017-11-11 LAB — COOXEMETRY PANEL
CARBOXYHEMOGLOBIN: 1 % (ref 0.5–1.5)
Methemoglobin: 1.5 % (ref 0.0–1.5)
O2 SAT: 57 %
TOTAL HEMOGLOBIN: 13.1 g/dL (ref 12.0–16.0)

## 2017-11-11 LAB — GLUCOSE, CAPILLARY
GLUCOSE-CAPILLARY: 156 mg/dL — AB (ref 70–99)
GLUCOSE-CAPILLARY: 176 mg/dL — AB (ref 70–99)
Glucose-Capillary: 228 mg/dL — ABNORMAL HIGH (ref 70–99)
Glucose-Capillary: 188 mg/dL — ABNORMAL HIGH (ref 70–99)

## 2017-11-11 LAB — MAGNESIUM: Magnesium: 2.1 mg/dL (ref 1.7–2.4)

## 2017-11-11 MED ORDER — TORSEMIDE 20 MG PO TABS
40.0000 mg | ORAL_TABLET | Freq: Two times a day (BID) | ORAL | Status: DC
  Administered 2017-11-12 (×2): 40 mg via ORAL
  Filled 2017-11-11 (×2): qty 2

## 2017-11-11 MED ORDER — AMIODARONE HCL 200 MG PO TABS
400.0000 mg | ORAL_TABLET | Freq: Two times a day (BID) | ORAL | Status: DC
Start: 2017-11-11 — End: 2017-11-19
  Administered 2017-11-11 – 2017-11-18 (×16): 400 mg via ORAL
  Filled 2017-11-11 (×16): qty 2

## 2017-11-11 MED ORDER — TORSEMIDE 20 MG PO TABS
40.0000 mg | ORAL_TABLET | Freq: Two times a day (BID) | ORAL | Status: DC
  Administered 2017-11-11: 40 mg via ORAL
  Filled 2017-11-11: qty 2

## 2017-11-11 NOTE — Progress Notes (Signed)
The patient had 7 beats of wide QRS. Notified HF team. Asymptomatic. I will continue to monitor the patient closely.   Saddie Benders RN

## 2017-11-11 NOTE — Progress Notes (Signed)
Present to assess Rt arm PICC.  Slight oozing of blood around site contained in bio patch.  No swelling of arm.  Flushes and gives good blood return.

## 2017-11-11 NOTE — Care Management Important Message (Signed)
Important Message  Patient Details  Name: Dillon Andrews MRN: [id number] Date of Birth: [birth year]   Medicare Important Message Given:  Yes    Loann Quill 11/11/2017, 11:36 AM

## 2017-11-11 NOTE — Progress Notes (Signed)
[facility]                                                                              Patient Demographics  Dillon Andrews, is a 77 y.o. male, DOB - [birth year], DPO:[id number]  Admit date - 11/05/2017   Admitting Physician Paticia Stack, MD  Outpatient Primary MD for the patient is [name], MD  Outpatient specialists:   LOS - 6  days   Medical records reviewed and are as summarized below:    Chief Complaint  Patient presents with  . Chest Pain  . Shortness of Breath       Brief summary   Patient is a 77 year old male with past medical history of liver cirrhosis with esophageal varices, combined systolic and diastolic CHF with ejection fraction of 25 to 30%, nonischemic cardiomyopathy, pulmonary hypertension, cardiac amyloidosis, CKD stage III, kappa light chain myeloma on chemotherapy was brought to the emergency department due to shortness of breath.  Patient was found to be in A. fib with RVR with hypotension on presentation.  Cardiology was consulted.  Started on amiodarone drip.  Also started on heparin for new onset A. fib.    CHF team following, cardioversion on 7/29.  Assessment & Plan    Principal problem Acute on chronic combined systolic and diastolic CHF, amyloidosis -2D echo 06/2017 showed EF of 25 to 30% with mild LAE, mild to moderate RV dilation moderate to severe RA. PYP scan suggestive of TTR amyloid. - negative balance of 8.62 L, weight down from 187--> 171lbs  -CHF team following, plan for stopping IV Lasix and switching to torsemide in a.m., continue milrinone  -Transition IV amiodarone, continue Eliquis  Active problems Acute new onset atrial fibrillation with RVR -Status post TEE DCCV on 7/29, now in normal sinus rhythm  - CHADS VASC 6, continue Eliquis  Liver cirrhosis with esophageal varices -Currently no signs of bleeding, monitor closely on anticoagulation -Last EGD 06/21/2014 at [facility] showed esophageal  varices, Barrett's esophagus, likely [name]. -Follow outpatient with gastroenterology  Kappa light chain myeloma -Follows Dr. Marin Olp, just had chemotherapy few days ago, was started on bortezomib on 10/20/2016 and Revlimid on 11/04/2017 -Outpatient follow-up with Dr. Marin Olp  CKD stage III -Baseline around 1.6, currently stable   Diabetes mellitus type 2 -Continue sliding scale insulin, meal coverage  Code Status: Full CODE STATUS DVT Prophylaxis: Apixaban Family Communication: Discussed in detail with the patient, all imaging results, lab results explained to the patient and wife at the bedside   Disposition Plan: Possible DC home in a.m.  Time Spent in minutes   *35 minutes  Procedures:  TEE  Consultants:   CHF team  Antimicrobials:      Medications  Scheduled Meds: . amiodarone  400 mg Oral BID  . apixaban  5 mg Oral BID  . dronabinol  2.5 mg Oral BID AC  . famciclovir  250 mg Oral Daily  . gabapentin  800 mg Oral QHS  . insulin aspart  0-15 Units Subcutaneous TID WC  . insulin aspart  0-5 Units Subcutaneous QHS  . insulin aspart  4 Units Subcutaneous TID WC  .  LORazepam  1 mg Oral Once  . magnesium oxide  400 mg Oral Daily  . Melatonin  6 mg Oral QHS  . pantoprazole  40 mg Oral Daily  . potassium chloride SA  40 mEq Oral BID  . rosuvastatin  10 mg Oral Daily  . sodium chloride flush  10-40 mL Intracatheter Q12H  . sodium chloride flush  3 mL Intravenous Q12H  . spironolactone  25 mg Oral Daily  . [START ON 11/12/2017] torsemide  40 mg Oral BID  . traZODone  50 mg Oral QHS   Continuous Infusions: . sodium chloride    . milrinone 0.125 mcg/kg/min (11/11/17 1232)   PRN Meds:.sodium chloride, acetaminophen, LORazepam, ondansetron (ZOFRAN) IV, sodium chloride flush, sodium chloride flush, zolpidem   Antibiotics   Anti-infectives (From admission, onward)   Start     Dose/Rate Route Frequency Ordered Stop   11/06/17 1000  famciclovir [facility]) tablet 250 mg      250 mg Oral Daily 11/05/17 1732          Subjective:   Dillon Andrews was seen and examined today.  Feels a lot better today, no shortness of breath or palpitations.  Still has some dizziness on standing up.  No chest pain, abdominal pain, N/V/D/C, new weakness, numbess, tingling. No acute events overnight.    Objective:   Vitals:   11/11/17 0755 11/11/17 1007 11/11/17 1123 11/11/17 1200  BP: 100/76  115/65   Pulse: 73  84   Resp:      Temp: 97.7 F (36.5 C)     TempSrc: Oral  Oral   SpO2: 95% 95% (!) 84% 92%  Weight:      Height:        Intake/Output Summary (Last 24 hours) at 11/11/2017 1245 Last data filed at 11/11/2017 1200 Gross per 24 hour  Intake 1510.29 ml  Output 2250 ml  Net -739.71 ml     Wt Readings from Last 3 Encounters:  11/11/17 77.9 kg (171 lb 12.8 oz)  10/22/17 84.3 kg (185 lb 12.8 oz)  09/29/17 85.4 kg (188 lb 4 oz)     Exam  General: Alert and oriented x 3, NAD  Eyes:   HEENT:    Cardiovascular: S1 S2 auscultated, Regular rate and rhythm.  Respiratory: Clear to auscultation bilaterally, no wheezing, rales or rhonchi  Gastrointestinal: Soft, nontender, nondistended, + bowel sounds  Ext: no pedal edema bilaterally  Neuro: no new deficits  Musculoskeletal: No digital cyanosis, clubbing  Skin: No rashes  Psych: Normal affect and demeanor, alert and oriented x3    Data Reviewed:  I have personally reviewed following labs and imaging studies  Micro Results Recent Results (from the past 240 hour(s))  Culture, Urine     Status: Abnormal   Collection Time: 11/05/17  4:24 PM  Result Value Ref Range Status   Specimen Description URINE, CLEAN CATCH  Final   Special Requests NONE  Final   Culture (A)  Final    <10,000 COLONIES/mL INSIGNIFICANT GROWTH Performed at [facility], [address], [place], [place] [address]    Report Status 11/06/2017 FINAL  Final  MRSA PCR Screening     Status: None   Collection Time:  11/05/17  9:52 PM  Result Value Ref Range Status   MRSA by PCR NEGATIVE NEGATIVE Final    Comment:        The GeneXpert MRSA Assay (FDA approved for NASAL specimens only), is one component of a comprehensive MRSA  colonization surveillance program. It is not intended to diagnose MRSA infection nor to guide or monitor treatment for MRSA infections. Performed at [facility], [place] [address] [place], [place] [address]     Radiology Reports Dg Chest 2 View  Result Date: 11/05/2017 CLINICAL DATA:  Mid chest tightness and shortness of breath for 2 days. EXAM: CHEST - 2 VIEW COMPARISON:  Single-view of the chest 09/15/2017. PA and lateral chest 07/10/2017. CT chest 03/20/2017. FINDINGS: There is cardiomegaly without edema. Small bilateral pleural effusions are seen on all the comparison examinations and have slightly increased since the most recent study. There is associated basilar atelectasis. Aortic atherosclerosis is noted. No acute or focal bony abnormality. IMPRESSION: Chronic small bilateral pleural effusions have increased somewhat since the most recent examination. Cardiomegaly without edema. Atherosclerosis. Electronically Signed   By: Inge Rise M.D.   On: 11/05/2017 12:35   Dg Chest Port 1 View  Result Date: 11/11/2017 CLINICAL DATA:  PICC line placement EXAM: PORTABLE CHEST 1 VIEW COMPARISON:  11/05/2017 FINDINGS: Right PICC line is in place with the tip at the cavoatrial junction. Cardiomegaly. Small bilateral effusions with bibasilar atelectasis or infiltrates, slightly worsened on the right since prior study. IMPRESSION: Right PICC line tip at the cavoatrial junction. Bilateral effusions and bibasilar atelectasis or infiltrates, right greater than left, worsening on the right since prior study. Electronically Signed   By: Rolm Baptise M.D.   On: 11/11/2017 10:48   [place] Ekg Site Rite  Result Date: 11/07/2017 If Site Rite image not attached, placement could not be  confirmed due to current cardiac rhythm.   Lab Data:  CBC: Recent Labs  Lab 11/05/17 1212 11/06/17 0636 11/07/17 0505 11/11/17 0546  WBC 13.2* 9.3 10.8* 11.6*  HGB 13.1 12.6* 13.1 12.3*  HCT 42.1 39.7 41.6 39.4  MCV 90.1 87.6 89.3 89.3  PLT 157 128* 141* 673   Basic Metabolic Panel: Recent Labs  Lab 11/06/17 0636 11/07/17 0505 11/08/17 0615 11/08/17 1350 11/09/17 0516 11/10/17 0530 11/11/17 0546  NA 139 141 141 138 140 140 139  K 4.0 3.6 2.9* 3.6 4.0 3.6 3.8  CL 101 97* 92* 94* 94* 92* 92*  CO2 28 30 34* 32 33* 36* 36*  GLUCOSE 161* 142* 168* 213* 205* 122* 118*  BUN 37* 42* 37* 38* 37* 36* 40*  CREATININE 1.46* 1.63* 1.53* 1.89* 1.71* 1.61* 1.69*  CALCIUM 8.8* 9.0 8.6* 8.5* 8.6* 8.9 8.8*  MG 1.9 2.0  --   --   --   --  2.1   GFR: Estimated Creatinine Clearance: 36 mL/min (A) (by C-G formula based on SCr of 1.69 mg/dL (H)). Liver Function Tests: No results for input(s): AST, ALT, ALKPHOS, BILITOT, PROT, ALBUMIN in the last 168 hours. No results for input(s): LIPASE, AMYLASE in the last 168 hours. No results for input(s): AMMONIA in the last 168 hours. Coagulation Profile: Recent Labs  Lab 11/05/17 1310  INR 1.32   Cardiac Enzymes: Recent Labs  Lab 11/05/17 2103  TROPONINI 0.35*   BNP (last 3 results) No results for input(s): PROBNP in the last 8760 hours. HbA1C: No results for input(s): HGBA1C in the last 72 hours. CBG: Recent Labs  Lab 11/10/17 1150 11/10/17 1631 11/10/17 2131 11/11/17 0753 11/11/17 1121  GLUCAP 136* 150* 296* 156* 228*   Lipid Profile: No results for input(s): CHOL, HDL, LDLCALC, TRIG, CHOLHDL, LDLDIRECT in the last 72 hours. Thyroid Function Tests: No results for input(s): TSH, T4TOTAL, FREET4, T3FREE, THYROIDAB in  the last 72 hours. Anemia Panel: No results for input(s): VITAMINB12, FOLATE, FERRITIN, TIBC, IRON, RETICCTPCT in the last 72 hours. Urine analysis:    Component Value Date/Time   COLORURINE YELLOW  11/05/2017 1624   APPEARANCEUR CLEAR 11/05/2017 1624   LABSPEC 1.012 11/05/2017 1624   PHURINE 5.0 11/05/2017 1624   GLUCOSEU NEGATIVE 11/05/2017 1624   HGBUR NEGATIVE 11/05/2017 1624   BILIRUBINUR NEGATIVE 11/05/2017 1624   KETONESUR NEGATIVE 11/05/2017 1624   PROTEINUR NEGATIVE 11/05/2017 1624   UROBILINOGEN 1.0 06/06/2008 0949   NITRITE NEGATIVE 11/05/2017 1624   LEUKOCYTESUR NEGATIVE 11/05/2017 1624     Alyze Lauf M.D. [facility] 11/11/2017, 12:45 PM  Pager: [phone] Between 7am to 7pm - call Pager - [phone]  After 7pm go to [email or web address] - password [email or web address]  Call night coverage person covering after 7pm

## 2017-11-11 NOTE — Progress Notes (Signed)
Physical Therapy Treatment Patient Details Name: Dillon Andrews MRN: [id number] DOB: [birth year] Today's Date: 11/11/2017    History of Present Illness Pt is a 77 y/o male admitted 11/05/17 secondary to worsening SOB and chest tightness; found to have a fib with RVR. S/p TEE and cardioversion 7/29. PMH inlcudes kappa light chain myeloma (currently undergoing chemotherapy), CHF, nonischemic cardiomyopathy, pulmonary HTN, CKD.     PT Comments    Pt able to increase gait distance today to 120' with 1 standing rest break.  Pt encouraged to walk with nursing later in day to increase LE strength.  Pt also able to gait on room air with spO2 95%   Follow Up Recommendations  Home health PT;Supervision for mobility/OOB     Equipment Recommendations  None recommended by PT    Recommendations for Other Services       Precautions / Restrictions Precautions Precaution Comments: watch HR; target HR 65-105 bpm Restrictions Weight Bearing Restrictions: No    Mobility  Bed Mobility   Bed Mobility: Supine to Sit     Supine to sit: Supervision     General bed mobility comments: Supervision for safety. Increased time required.   Transfers   Equipment used: Rolling walker (2 wheeled) Transfers: Sit to/from Stand Sit to Stand: Supervision         General transfer comment: supervision today with RW  Ambulation/Gait Ambulation/Gait assistance: Min guard Gait Distance (Feet): 120 Feet Assistive device: Rolling walker (2 wheeled)       General Gait Details: pt able to ambulate x 120' with 1 standing rest break, HR 82 bpm   Stairs             Wheelchair Mobility    Modified Rankin (Stroke Patients Only)       Balance           Standing balance support: No upper extremity supported Standing balance-Leahy Scale: Fair Standing balance comment: able to take 3 steps without RW with min A                            Cognition Arousal/Alertness:  Awake/alert Behavior During Therapy: WFL for tasks assessed/performed Overall Cognitive Status: Within Functional Limits for tasks assessed                                        Exercises General Exercises - Lower Extremity Ankle Circles/Pumps: 10 reps;AROM;Both Long Arc Quad: 10 reps;Both;AROM Hip Flexion/Marching: 10 reps;Both;Seated;AROM    General Comments        Pertinent Vitals/Pain Pain Assessment: No/denies pain    Home Living                      Prior Function            PT Goals (current goals can now be found in the care plan section) Progress towards PT goals: Progressing toward goals    Frequency    Min 3X/week      PT Plan Current plan remains appropriate    Co-evaluation              AM-PAC PT "6 Clicks" Daily Activity  Outcome Measure  Difficulty turning over in bed (including adjusting bedclothes, sheets and blankets)?: A Little Difficulty moving from lying on back to sitting on the side of the bed? : A  Little Difficulty sitting down on and standing up from a chair with arms (e.g., wheelchair, bedside commode, etc,.)?: A Little Help needed moving to and from a bed to chair (including a wheelchair)?: A Little Help needed walking in hospital room?: A Little Help needed climbing 3-5 steps with a railing? : A Lot 6 Click Score: 17    End of Session   Activity Tolerance: Patient tolerated treatment well Patient left: in bed;with call bell/phone within reach;with family/visitor present;with bed alarm set Nurse Communication: Mobility status PT Visit Diagnosis: Unsteadiness on feet (R26.81);Muscle weakness (generalized) (M62.81);Other abnormalities of gait and mobility (R26.89)     Time: 3475-8307 PT Time Calculation (min) (ACUTE ONLY): 17 min  Charges:  $Gait Training: 8-22 mins                     Isabelle Course, PT, DPT   Aivah Putman 11/11/2017, 10:10 AM

## 2017-11-11 NOTE — Progress Notes (Addendum)
Advanced Heart Failure Rounding Note  PCP-Cardiologist: Glenetta Hew, MD   Subjective:    Brisk diuresis. Down another 5 lbs (16 lbs total). Creatinine 1.69, K 3.8.   CVP 10. Co-ox 57%. Started on milrinone 0.125 mcg/kg/min.   Underwent successful TEE/DCCV yesterday. Remains on IV amio and Eliquis.   Feels much better this morning. Denies CP, SOB, or bleeding. Having some mild dizziness when he gets up. CVP 10  Rhythm is sinus with frequent (vs wandering atrial pacemaker)   Objective:   Weight Range: 171 lb 12.8 oz (77.9 kg) Body mass index is 25.37 kg/m.   Vital Signs:   Temp:  [97.7 F (36.5 C)-98 F (36.7 C)] 97.9 F (36.6 C) (07/30 0531) Pulse Rate:  [74-100] 78 (07/30 0531) Resp:  [18-20] 18 (07/30 0531) BP: (93-111)/(60-80) 109/72 (07/30 0545) SpO2:  [92 %-98 %] 98 % (07/30 0545) Weight:  [171 lb 12.8 oz (77.9 kg)-176 lb (79.8 kg)] 171 lb 12.8 oz (77.9 kg) (07/30 0531) Last BM Date: 11/09/17  Weight change: Filed Weights   11/10/17 0511 11/10/17 1218 11/11/17 0531  Weight: 176 lb (79.8 kg) 176 lb (79.8 kg) 171 lb 12.8 oz (77.9 kg)    Intake/Output:   Intake/Output Summary (Last 24 hours) at 11/11/2017 0733 Last data filed at 11/11/2017 0708 Gross per 24 hour  Intake 1405.55 ml  Output 2250 ml  Net -844.45 ml      Physical Exam    CVP 10 General: Elderly appearing. No resp difficulty. HEENT: Normal Neck: Supple. JVP 10. Carotids 2+ bilat; no bruits. No thyromegaly or nodule noted. Cor: PMI nondisplaced. Mildly irregular, 2/6 TR Lungs: CTAB, normal effort. No wheeze Abdomen: Soft, non-tender, non-distended, no HSM. No bruits or masses. +BS  Extremities: No cyanosis, clubbing, or rash. R and LLE no edema. RUE PICC Neuro: alert & oriented x 3, cranial nerves grossly intact. moves all 4 extremities w/o difficulty. Affect pleasant   Telemetry   NSR 70s with rare PACs Personally reviewed.    EKG    NSR 75 with 1st degree AV block.   Labs      CBC Recent Labs    11/11/17 0546  WBC 11.6*  HGB 12.3*  HCT 39.4  MCV 89.3  PLT 967   Basic Metabolic Panel Recent Labs    11/10/17 0530 11/11/17 0546  NA 140 139  K 3.6 3.8  CL 92* 92*  CO2 36* 36*  GLUCOSE 122* 118*  BUN 36* 40*  CREATININE 1.61* 1.69*  CALCIUM 8.9 8.8*   Liver Function Tests No results for input(s): AST, ALT, ALKPHOS, BILITOT, PROT, ALBUMIN in the last 72 hours. No results for input(s): LIPASE, AMYLASE in the last 72 hours. Cardiac Enzymes No results for input(s): CKTOTAL, CKMB, CKMBINDEX, TROPONINI in the last 72 hours.  BNP: BNP (last 3 results) Recent Labs    06/25/17 1207 09/15/17 1401 11/05/17 1310  BNP 420.9* 393.0* 515.3*    ProBNP (last 3 results) No results for input(s): PROBNP in the last 8760 hours.   D-Dimer No results for input(s): DDIMER in the last 72 hours. Hemoglobin A1C No results for input(s): HGBA1C in the last 72 hours. Fasting Lipid Panel No results for input(s): CHOL, HDL, LDLCALC, TRIG, CHOLHDL, LDLDIRECT in the last 72 hours. Thyroid Function Tests No results for input(s): TSH, T4TOTAL, T3FREE, THYROIDAB in the last 72 hours.  Invalid input(s): FREET3  Other results:   Imaging    No results found.   Medications:  Scheduled Medications: . apixaban  5 mg Oral BID  . dronabinol  2.5 mg Oral BID AC  . famciclovir  250 mg Oral Daily  . furosemide  80 mg Intravenous BID  . gabapentin  800 mg Oral QHS  . insulin aspart  0-15 Units Subcutaneous TID WC  . insulin aspart  0-5 Units Subcutaneous QHS  . insulin aspart  4 Units Subcutaneous TID WC  . LORazepam  1 mg Oral Once  . magnesium oxide  400 mg Oral Daily  . Melatonin  6 mg Oral QHS  . pantoprazole  40 mg Oral Daily  . potassium chloride SA  40 mEq Oral BID  . rosuvastatin  10 mg Oral Daily  . sodium chloride flush  10-40 mL Intracatheter Q12H  . sodium chloride flush  3 mL Intravenous Q12H  . spironolactone  25 mg Oral Daily  .  traZODone  50 mg Oral QHS    Infusions: . sodium chloride    . amiodarone 30 mg/hr (11/11/17 0400)  . milrinone 0.125 mcg/kg/min (11/11/17 0400)    PRN Medications: sodium chloride, acetaminophen, LORazepam, ondansetron (ZOFRAN) IV, sodium chloride flush, sodium chloride flush, zolpidem    Patient Profile   Dillon Andrews is a 77 y.o. male with a hx of chronic combined CHF due to NICM, CKD III,presumedNASH(per pt, never proven),esophageal varices per chart(prior glass of wine occasionally but no heavy alcohol),HTN, HLD, DM, Barrett's esophagus, GERD, anemia, RBBB, TTR amyloidosis, and Kappa Light Chain myeloma.  Admitted to [facility] 11/05/17 in setting of new Afib with RVR.   Assessment/Plan   1. Acute on chronic combined HF with TTR amyloidosis (Wild Type) - NICM by cath at [facility] 09/2016 with EF 40-45%. Echo 06/2017: EF 25-30%, mild LAE, mild/mod RV dilation, mod/severe RA. - PYP scan strongly suggestive of TTR amyloid, SPEP with 0.8% m-spike, UPEP with Bence Jones Protein positive, kappa type. 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 - Now also diagnosed with multiple myeloma so suspect this may be AL amyloid but wouldn't expect PYP to be positive. She may have both TTR and AL amyloid but this would be unusual  -Has been approved for Tafamidis, but his co-pay is $1000, which he cannot afford. Will defer for now in setting of treatment for MM - Volume status remains elevated with CVP 10. May not get much lower with RV failure. Watch renal function closely. Stop IV lasix. Start torsemide 40 mg BID. - Co-ox 57%. Continue milrinone 0.125 mcg/kg/min for now.  - Carvedilol on hold with marginal co-ox. On amio for AFL  - Continue spiro 25 mg daily - No ARB/entresto with CKD  2. New onset AFlutter  - likely triggered HF decompensation  - s/p TEE DCCV 7/29. Remains in NSR . Switch IV amio to PO amio 400 BID. - On Eliquis 5 bid. Denies bleeding.  - CHA2DS2/VASc is at least 6.  (CHF, HTN, DM, CAD (mild, non-obstructive), and 2 for Age)  3. CKD III, baseline ~1.6 -Creatinine stable 1.69 this am. Monitor daily BMET.  4. Recurrent bilateral effusions - Somewhat increased from March on CXR 11/05/17. Follow with diuresis. No change.   5. Kappa Lightchain Myeloma - Follows with Dr. Marin Olp who has seen this admission.  - Started on Bortezomib7/18/18andRevlimid7/23/19. - Continue management per primary team. No change.   6. NASH with esophageal varices.  - Watch closely for bleeding with AC. Denies bleeding.  - Last EGD 06/21/2014 at [facility] for surveillance of Esophageal varices, Barrett's esophagus,  and PMHx of Colon polyps. Showed two columns of small esophageal varices w/out stigmata, short segment Barrett's esophagus (without biopsies, due to presence of EVs), 5cm hiatal hernia, and hyperplastic small gastric nodules  7. DM2 - Per primary. No change.   8. Hypokalemia. - K 3.8  Medication concerns reviewed with patient and pharmacy team. Barriers identified: None at this time.   Length of Stay: [place], NP  7:33 AM  Advanced Heart Failure Team Pager [phone] (M-F; 7a - 4p)  Please contact [facility] for night-coverage after hours (4p -7a ) and weekends on [email or web address]  Patient seen and examined with the above-signed Advanced Practice Provider and/or Housestaff. I personally reviewed laboratory data, imaging studies and relevant notes. I independently examined the patient and formulated the important aspects of the plan. I have edited the note to reflect any of my changes or salient points. I have personally discussed the plan with the patient and/or family.  He is much improved with milrinone support and DC-CV. CVP down to 10. Given severe RV/LV dysfunction this may be as good as we get him. Can stop IV lasix. Switch to torsemide tomorrow. Continue milrinone at least on more day. Remains on Eliquis without bleeding. Switch IV amio to  po.   On TEE had severe biventricular dysfunction. Hopefully will improve with restoration of NSR. Prognosis tenuous.   Glori Bickers, MD  12:44 PM

## 2017-11-12 ENCOUNTER — Encounter (HOSPITAL_COMMUNITY): Payer: Medicare HMO

## 2017-11-12 ENCOUNTER — Other Ambulatory Visit: Payer: Medicare HMO

## 2017-11-12 ENCOUNTER — Ambulatory Visit: Payer: Medicare HMO | Admitting: Hematology & Oncology

## 2017-11-12 DIAGNOSIS — J90 Pleural effusion, not elsewhere classified: Secondary | ICD-10-CM

## 2017-11-12 DIAGNOSIS — I43 Cardiomyopathy in diseases classified elsewhere: Secondary | ICD-10-CM

## 2017-11-12 LAB — COOXEMETRY PANEL
CARBOXYHEMOGLOBIN: 1.2 % (ref 0.5–1.5)
Methemoglobin: 1.4 % (ref 0.0–1.5)
O2 SAT: 58.4 %
TOTAL HEMOGLOBIN: 12.9 g/dL (ref 12.0–16.0)

## 2017-11-12 LAB — BASIC METABOLIC PANEL
Anion gap: 12 (ref 5–15)
BUN: 40 mg/dL — ABNORMAL HIGH (ref 8–23)
CALCIUM: 8.7 mg/dL — AB (ref 8.9–10.3)
CO2: 35 mmol/L — AB (ref 22–32)
CREATININE: 1.77 mg/dL — AB (ref 0.61–1.24)
Chloride: 93 mmol/L — ABNORMAL LOW (ref 98–111)
GFR calc Af Amer: 41 mL/min — ABNORMAL LOW (ref >60)
GFR calc non Af Amer: 35 mL/min — ABNORMAL LOW (ref >60)
GLUCOSE: 138 mg/dL — AB (ref 70–99)
Potassium: 3.6 mmol/L (ref 3.5–5.1)
Sodium: 140 mmol/L (ref 135–145)

## 2017-11-12 LAB — GLUCOSE, CAPILLARY
GLUCOSE-CAPILLARY: 144 mg/dL — AB (ref 70–99)
Glucose-Capillary: 160 mg/dL — ABNORMAL HIGH (ref 70–99)
Glucose-Capillary: 165 mg/dL — ABNORMAL HIGH (ref 70–99)
Glucose-Capillary: 78 mg/dL (ref 70–99)

## 2017-11-12 LAB — MAGNESIUM: Magnesium: 1.9 mg/dL (ref 1.7–2.4)

## 2017-11-12 MED ORDER — INSULIN ASPART 100 UNIT/ML ~~LOC~~ SOLN
5.0000 [IU] | Freq: Three times a day (TID) | SUBCUTANEOUS | Status: DC
  Administered 2017-11-12 – 2017-11-24 (×25): 5 [IU] via SUBCUTANEOUS

## 2017-11-12 MED ORDER — MILRINONE LACTATE IN DEXTROSE 20-5 MG/100ML-% IV SOLN
0.1250 ug/kg/min | INTRAVENOUS | Status: DC
Start: 2017-11-12 — End: 2017-11-15
  Administered 2017-11-12 – 2017-11-15 (×3): 0.125 ug/kg/min via INTRAVENOUS
  Filled 2017-11-12 (×3): qty 100

## 2017-11-12 NOTE — Progress Notes (Signed)
[facility]                                                                              Patient Demographics  Dillon Andrews, is a 77 y.o. male, DOB - March 16, 1941, JXB:[id number]  Admit date - 11/05/2017   Admitting Physician Paticia Stack, MD  Outpatient Primary MD for the patient is [name], MD  Outpatient specialists:   LOS - 7  days   Medical records reviewed and are as summarized below:    Chief Complaint  Patient presents with  . Chest Pain  . Shortness of Breath       Brief summary   Patient is a 77 year old male with past medical history of liver cirrhosis with esophageal varices, combined systolic and diastolic CHF with ejection fraction of 25 to 30%, nonischemic cardiomyopathy, pulmonary hypertension, cardiac amyloidosis, CKD stage III, kappa light chain myeloma on chemotherapy was brought to the emergency department due to shortness of breath.  Patient was found to be in A. fib with RVR with hypotension on presentation.  Cardiology was consulted.  Started on amiodarone drip.  Also started on heparin for new onset A. fib.    CHF team following, cardioversion on 7/29.  Assessment & Plan    Principal problem Acute on chronic combined systolic and diastolic CHF, amyloidosis -2D echo 06/2017 showed EF of 25 to 30% with mild LAE, mild to moderate RV dilation moderate to severe RA. PYP scan suggestive of TTR amyloid. -Negative balance of 10.4 L, weight down from 187--> 170lbs -CHF team following, IV Lasix discontinued, now on torsemide and IV milrinone   Active problems Acute new onset atrial fibrillation with RVR -Status post TEE DCCV on 7/29, briefly returned to normal sinus rhythm  - CHADS VASC 6, continue Eliquis -IV amiodarone transition to oral, back in atrial flutter today, cardiology recommending EP consult for ablation  Liver cirrhosis with esophageal varices -Currently no signs of bleeding, monitor closely on  anticoagulation -Last EGD 06/21/2014 at [facility] showed esophageal varices, Barrett's esophagus, likely [name]. -Follow outpatient with gastroenterology  Kappa light chain myeloma -Follows Dr. Marin Olp, just had chemotherapy few days ago, was started on bortezomib on 10/20/2016 and Revlimid on 11/04/2017 -Outpatient follow-up with Dr. Marin Olp  CKD stage III -Baseline around 1.6 -Creatinine 1.7, follow closely with diuresis   Diabetes mellitus type 2 -CBGs slightly elevated, continue sliding scale insulin, increase NovoLog meal coverage to 5 units 3 times daily AC  Code Status: Full CODE STATUS DVT Prophylaxis: Apixaban Family Communication: Discussed in detail with the patient, all imaging results, lab results explained to the patient and wife at the bedside   Disposition Plan: Once cleared by cardiology Time Spent in minutes   *35 minutes  Procedures:  TEE  Consultants:   CHF team  Antimicrobials:      Medications  Scheduled Meds: . amiodarone  400 mg Oral BID  . apixaban  5 mg Oral BID  . dronabinol  2.5 mg Oral BID AC  . famciclovir  250 mg Oral Daily  . gabapentin  800 mg Oral QHS  . insulin aspart  0-15 Units Subcutaneous TID  WC  . insulin aspart  0-5 Units Subcutaneous QHS  . insulin aspart  4 Units Subcutaneous TID WC  . LORazepam  1 mg Oral Once  . magnesium oxide  400 mg Oral Daily  . Melatonin  6 mg Oral QHS  . pantoprazole  40 mg Oral Daily  . rosuvastatin  10 mg Oral Daily  . sodium chloride flush  10-40 mL Intracatheter Q12H  . sodium chloride flush  3 mL Intravenous Q12H  . spironolactone  25 mg Oral Daily  . torsemide  40 mg Oral BID  . traZODone  50 mg Oral QHS   Continuous Infusions: . sodium chloride 7 mL/hr at 11/12/17 0800  . milrinone 0.125 mcg/kg/min (11/12/17 0754)   PRN Meds:.sodium chloride, acetaminophen, LORazepam, ondansetron (ZOFRAN) IV, sodium chloride flush, sodium chloride flush, zolpidem   Antibiotics   Anti-infectives  (From admission, onward)   Start     Dose/Rate Route Frequency Ordered Stop   11/06/17 1000  famciclovir [facility]) tablet 250 mg     250 mg Oral Daily 11/05/17 1732          Subjective:   Dillon Andrews was seen and examined today.  Feeling well overall, no palpitations.  However did not sleep well last night.  No chest pain or shortness of breath.  Denies any abdominal pain, N/V/D/C, new weakness, numbess, tingling. No acute events overnight.    Objective:   Vitals:   11/12/17 0540 11/12/17 0610 11/12/17 0800 11/12/17 1135  BP: 94/68 106/70 110/76 120/87  Pulse: 91   91  Resp: 20 (!) 23 (!) 27 19  Temp: (!) 97.5 F (36.4 C)   97.7 F (36.5 C)  TempSrc: Oral   Oral  SpO2: 93% 93% 93% 97%  Weight: 77.2 kg (170 lb 4.8 oz)     Height:        Intake/Output Summary (Last 24 hours) at 11/12/2017 1225 Last data filed at 11/12/2017 0900 Gross per 24 hour  Intake 617.08 ml  Output 2650 ml  Net -2032.92 ml     Wt Readings from Last 3 Encounters:  11/12/17 77.2 kg (170 lb 4.8 oz)  10/22/17 84.3 kg (185 lb 12.8 oz)  09/29/17 85.4 kg (188 lb 4 oz)     Exam    General: Alert and oriented x 3, NAD  Eyes:  HEENT:  Atraumatic, normocephalic  Cardiovascular: S1 S2 auscultated, mildly irregular No pedal edema b/l  Respiratory: Clear to auscultation bilaterally, no wheezing, rales or rhonchi  Gastrointestinal: Soft, nontender, nondistended, + bowel sounds  Ext: no pedal edema bilaterally  Neuro: no neurological deficits  Musculoskeletal: No digital cyanosis, clubbing  Skin: No rashes  Psych: Normal affect and demeanor, alert and oriented x3     Data Reviewed:  I have personally reviewed following labs and imaging studies  Micro Results Recent Results (from the past 240 hour(s))  Culture, Urine     Status: Abnormal   Collection Time: 11/05/17  4:24 PM  Result Value Ref Range Status   Specimen Description URINE, CLEAN CATCH  Final   Special Requests NONE  Final    Culture (A)  Final    <10,000 COLONIES/mL INSIGNIFICANT GROWTH Performed at [facility], [address] [place], [place] [address]    Report Status 11/06/2017 FINAL  Final  MRSA PCR Screening     Status: None   Collection Time: 11/05/17  9:52 PM  Result Value Ref Range Status   MRSA by PCR NEGATIVE NEGATIVE Final  Comment:        The GeneXpert MRSA Assay (FDA approved for NASAL specimens only), is one component of a comprehensive MRSA colonization surveillance program. It is not intended to diagnose MRSA infection nor to guide or monitor treatment for MRSA infections. Performed at [facility], [place] [address] [place], [place] [address]     Radiology Reports Dg Chest 2 View  Result Date: 11/05/2017 CLINICAL DATA:  Mid chest tightness and shortness of breath for 2 days. EXAM: CHEST - 2 VIEW COMPARISON:  Single-view of the chest 09/15/2017. PA and lateral chest 07/10/2017. CT chest 03/20/2017. FINDINGS: There is cardiomegaly without edema. Small bilateral pleural effusions are seen on all the comparison examinations and have slightly increased since the most recent study. There is associated basilar atelectasis. Aortic atherosclerosis is noted. No acute or focal bony abnormality. IMPRESSION: Chronic small bilateral pleural effusions have increased somewhat since the most recent examination. Cardiomegaly without edema. Atherosclerosis. Electronically Signed   By: Inge Rise M.D.   On: 11/05/2017 12:35   Dg Chest Port 1 View  Result Date: 11/11/2017 CLINICAL DATA:  PICC line placement EXAM: PORTABLE CHEST 1 VIEW COMPARISON:  11/05/2017 FINDINGS: Right PICC line is in place with the tip at the cavoatrial junction. Cardiomegaly. Small bilateral effusions with bibasilar atelectasis or infiltrates, slightly worsened on the right since prior study. IMPRESSION: Right PICC line tip at the cavoatrial junction. Bilateral effusions and bibasilar atelectasis or infiltrates,  right greater than left, worsening on the right since prior study. Electronically Signed   By: Rolm Baptise M.D.   On: 11/11/2017 10:48   [place] Ekg Site Rite  Result Date: 11/07/2017 If Site Rite image not attached, placement could not be confirmed due to current cardiac rhythm.   Lab Data:  CBC: Recent Labs  Lab 11/06/17 0636 11/07/17 0505 11/11/17 0546  WBC 9.3 10.8* 11.6*  HGB 12.6* 13.1 12.3*  HCT 39.7 41.6 39.4  MCV 87.6 89.3 89.3  PLT 128* 141* 707   Basic Metabolic Panel: Recent Labs  Lab 11/06/17 0636 11/07/17 0505  11/08/17 1350 11/09/17 0516 11/10/17 0530 11/11/17 0546 11/12/17 0610  NA 139 141   < > 138 140 140 139 140  K 4.0 3.6   < > 3.6 4.0 3.6 3.8 3.6  CL 101 97*   < > 94* 94* 92* 92* 93*  CO2 28 30   < > 32 33* 36* 36* 35*  GLUCOSE 161* 142*   < > 213* 205* 122* 118* 138*  BUN 37* 42*   < > 38* 37* 36* 40* 40*  CREATININE 1.46* 1.63*   < > 1.89* 1.71* 1.61* 1.69* 1.77*  CALCIUM 8.8* 9.0   < > 8.5* 8.6* 8.9 8.8* 8.7*  MG 1.9 2.0  --   --   --   --  2.1 1.9   < > = values in this interval not displayed.   GFR: Estimated Creatinine Clearance: 34.4 mL/min (A) (by C-G formula based on SCr of 1.77 mg/dL (H)). Liver Function Tests: No results for input(s): AST, ALT, ALKPHOS, BILITOT, PROT, ALBUMIN in the last 168 hours. No results for input(s): LIPASE, AMYLASE in the last 168 hours. No results for input(s): AMMONIA in the last 168 hours. Coagulation Profile: Recent Labs  Lab 11/05/17 1310  INR 1.32   Cardiac Enzymes: Recent Labs  Lab 11/05/17 2103  TROPONINI 0.35*   BNP (last 3 results) No results for input(s): PROBNP in the last 8760 hours. HbA1C: No  results for input(s): HGBA1C in the last 72 hours. CBG: Recent Labs  Lab 11/11/17 1121 11/11/17 1633 11/11/17 2135 11/12/17 0755 11/12/17 1137  GLUCAP 228* 176* 188* 160* 165*   Lipid Profile: No results for input(s): CHOL, HDL, LDLCALC, TRIG, CHOLHDL, LDLDIRECT in the last 72  hours. Thyroid Function Tests: No results for input(s): TSH, T4TOTAL, FREET4, T3FREE, THYROIDAB in the last 72 hours. Anemia Panel: No results for input(s): VITAMINB12, FOLATE, FERRITIN, TIBC, IRON, RETICCTPCT in the last 72 hours. Urine analysis:    Component Value Date/Time   COLORURINE YELLOW 11/05/2017 1624   APPEARANCEUR CLEAR 11/05/2017 1624   LABSPEC 1.012 11/05/2017 1624   PHURINE 5.0 11/05/2017 1624   GLUCOSEU NEGATIVE 11/05/2017 1624   HGBUR NEGATIVE 11/05/2017 1624   BILIRUBINUR NEGATIVE 11/05/2017 1624   KETONESUR NEGATIVE 11/05/2017 1624   PROTEINUR NEGATIVE 11/05/2017 1624   UROBILINOGEN 1.0 06/06/2008 0949   NITRITE NEGATIVE 11/05/2017 1624   LEUKOCYTESUR NEGATIVE 11/05/2017 1624     Ripudeep Rai M.D. [facility] 11/12/2017, 12:25 PM  Pager: [phone] Between 7am to 7pm - call Pager - [phone]  After 7pm go to [email or web address] - password [email or web address]  Call night coverage person covering after 7pm

## 2017-11-12 NOTE — Progress Notes (Addendum)
Advanced Heart Failure Rounding Note  PCP-Cardiologist: Glenetta Hew, MD   Subjective:    Received 40 mg torsemide x1. Brisk diuresis with -2.3 L. Weight down 1 more lb (17 total). . CVP 9-10. Not much UOP charted, but pt says condom cath fell off.   Creatinine slight bump 1.69 > 1.77, K 3.6.   Feeling much better today but still SOB with walking. Coox 58% on milrinone 0.125 mcg/kg/min.   Underwent successful TEE/DCCV 11/10/17. Now on PO amio. On PO eliquis. PICC line bleeding, but none otherwise. It looks like he may be back in atrial flutter today. Will get EKG to confirm.   Objective:   Weight Range: 170 lb 4.8 oz (77.2 kg) Body mass index is 25.15 kg/m.   Vital Signs:   Temp:  [97.5 F (36.4 C)-98.2 F (36.8 C)] 97.5 F (36.4 C) (07/31 0540) Pulse Rate:  [84-91] 91 (07/31 0540) Resp:  [17-23] 23 (07/31 0610) BP: (94-115)/(62-70) 106/70 (07/31 0610) SpO2:  [84 %-96 %] 93 % (07/31 0610) Weight:  [170 lb 4.8 oz (77.2 kg)] 170 lb 4.8 oz (77.2 kg) (07/31 0540) Last BM Date: 11/10/17  Weight change: Filed Weights   11/10/17 1218 11/11/17 0531 11/12/17 0540  Weight: 176 lb (79.8 kg) 171 lb 12.8 oz (77.9 kg) 170 lb 4.8 oz (77.2 kg)    Intake/Output:   Intake/Output Summary (Last 24 hours) at 11/12/2017 0819 Last data filed at 11/12/2017 0540 Gross per 24 hour  Intake 614.06 ml  Output 2650 ml  Net -2035.94 ml      Physical Exam    CVP 9-10 General: Elderly appearing. No resp difficulty. HEENT: Normal Anicteric  Neck: Supple. JVP 7-8 prominent CV waves Carotids 2+ bilat; no bruits. No thyromegaly or nodule noted. Cor: PMI nondisplaced. Mildly irregular, 2/6 TR. _ s3 Lungs: CTAB, normal effort.No wheeze Abdomen: Soft, non-tender, non-distended, no HSM. No bruits or masses. +BS  Extremities: No cyanosis, clubbing, or rash. R and LLE no edema. RUE PICC, bleeding at site, quickclot dressing applied Neuro: alert & oriented x 3, cranial nerves grossly intact.  moves all 4 extremities w/o difficulty. Affect pleasant  Telemetry   Appears to have converted back into Atrial flutter. Will get EKG to confirm  EKG    11/11/17: NSR 81 bpm with PACs vs WAP. Personally reviewed.   Labs    CBC Recent Labs    11/11/17 0546  WBC 11.6*  HGB 12.3*  HCT 39.4  MCV 89.3  PLT 270   Basic Metabolic Panel Recent Labs    11/11/17 0546 11/12/17 0610  NA 139 140  K 3.8 3.6  CL 92* 93*  CO2 36* 35*  GLUCOSE 118* 138*  BUN 40* 40*  CREATININE 1.69* 1.77*  CALCIUM 8.8* 8.7*  MG 2.1  --    Liver Function Tests No results for input(s): AST, ALT, ALKPHOS, BILITOT, PROT, ALBUMIN in the last 72 hours. No results for input(s): LIPASE, AMYLASE in the last 72 hours. Cardiac Enzymes No results for input(s): CKTOTAL, CKMB, CKMBINDEX, TROPONINI in the last 72 hours.  BNP: BNP (last 3 results) Recent Labs    06/25/17 1207 09/15/17 1401 11/05/17 1310  BNP 420.9* 393.0* 515.3*    ProBNP (last 3 results) No results for input(s): PROBNP in the last 8760 hours.   D-Dimer No results for input(s): DDIMER in the last 72 hours. Hemoglobin A1C No results for input(s): HGBA1C in the last 72 hours. Fasting Lipid Panel No results for input(s): CHOL, HDL,  LDLCALC, TRIG, CHOLHDL, LDLDIRECT in the last 72 hours. Thyroid Function Tests No results for input(s): TSH, T4TOTAL, T3FREE, THYROIDAB in the last 72 hours.  Invalid input(s): FREET3  Other results:   Imaging    Dg Chest Port 1 View  Result Date: 11/11/2017 CLINICAL DATA:  PICC line placement EXAM: PORTABLE CHEST 1 VIEW COMPARISON:  11/05/2017 FINDINGS: Right PICC line is in place with the tip at the cavoatrial junction. Cardiomegaly. Small bilateral effusions with bibasilar atelectasis or infiltrates, slightly worsened on the right since prior study. IMPRESSION: Right PICC line tip at the cavoatrial junction. Bilateral effusions and bibasilar atelectasis or infiltrates, right greater than left,  worsening on the right since prior study. Electronically Signed   By: Rolm Baptise M.D.   On: 11/11/2017 10:48     Medications:     Scheduled Medications: . amiodarone  400 mg Oral BID  . apixaban  5 mg Oral BID  . dronabinol  2.5 mg Oral BID AC  . famciclovir  250 mg Oral Daily  . gabapentin  800 mg Oral QHS  . insulin aspart  0-15 Units Subcutaneous TID WC  . insulin aspart  0-5 Units Subcutaneous QHS  . insulin aspart  4 Units Subcutaneous TID WC  . LORazepam  1 mg Oral Once  . magnesium oxide  400 mg Oral Daily  . Melatonin  6 mg Oral QHS  . pantoprazole  40 mg Oral Daily  . potassium chloride SA  40 mEq Oral BID  . rosuvastatin  10 mg Oral Daily  . sodium chloride flush  10-40 mL Intracatheter Q12H  . sodium chloride flush  3 mL Intravenous Q12H  . spironolactone  25 mg Oral Daily  . torsemide  40 mg Oral BID  . traZODone  50 mg Oral QHS    Infusions: . sodium chloride 7 mL/hr at 11/11/17 2106  . milrinone 0.125 mcg/kg/min (11/12/17 0754)    PRN Medications: sodium chloride, acetaminophen, LORazepam, ondansetron (ZOFRAN) IV, sodium chloride flush, sodium chloride flush, zolpidem    Patient Profile   Dillon Andrews is a 77 y.o. male with a hx of chronic combined CHF due to NICM, CKD III,presumedNASH(per pt, never proven),esophageal varices per chart(prior glass of wine occasionally but no heavy alcohol),HTN, HLD, DM, Barrett's esophagus, GERD, anemia, RBBB, TTR amyloidosis, and Kappa Light Chain myeloma.  Admitted to [facility] 11/05/17 in setting of new Afib with RVR.   Assessment/Plan   1. Acute on chronic combined HF with TTR amyloidosis (Wild Type) - NICM by cath at [facility] 09/2016 with EF 40-45%. Echo 06/2017: EF 25-30%, mild LAE, mild/mod RV dilation, mod/severe RA. - PYP scan strongly suggestive of TTR amyloid, SPEP with 0.8% m-spike, UPEP with Bence Jones Protein positive, kappa type. 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 - Now also diagnosed with  multiple myeloma so suspect this may be AL amyloid but wouldn't expect PYP to be positive. She may have both TTR and AL amyloid but this would be unusual  -Has been approved for Tafamidis, but his co-pay is $1000, which he cannot afford. Will defer for now in setting of treatment for MM - Volume status stable on exam. CVP 9-10. Continue torsemide 40 mg BID.  - Co-ox 58%. Continue milrinone 0.125 mcg/kg/min for now.  - Carvedilol on hold with marginal co-ox. On PO amio for AFL  - Continue spiro 25 mg daily - No ARB/entresto with CKD  2. New onset AFlutter  - likely triggered HF decompensation  -  s/p TEE DCCV 7/29. Remains in NSR . Continue amio 400 mg BID. - On Eliquis 5 bid. Bleeding at PICC site. None otherwise.  - CHA2DS2/VASc is at least 6. (CHF, HTN, DM, CAD (mild, non-obstructive), and 2 for Age) - Seems to be back in atrial flutter today. Will consult EP for ?ablation.   3. CKD III, baseline ~1.6 -Creatinine slight bump 1.69 > 1.77 today  4. Recurrent bilateral effusions - Somewhat increased from March on CXR 11/05/17. Follow with diuresis. No change.  5. Kappa Lightchain Myeloma - Follows with Dr. Marin Olp who has seen this admission.  - Started on Bortezomib7/18/18andRevlimid7/23/19. - Continue management per primary team. No change.   6. NASH with esophageal varices.  - Watch closely for bleeding with AC. Denies bleeding.  - Last EGD 06/21/2014 at [facility] for surveillance of Esophageal varices, Barrett's esophagus, and PMHx of Colon polyps. Showed two columns of small esophageal varices w/out stigmata, short segment Barrett's esophagus (without biopsies, due to presence of EVs), 5cm hiatal hernia, and hyperplastic small gastric nodules  7. DM2 - Per primary. No change.   8. Hypokalemia. - K 3.6. Supp.   Medication concerns reviewed with patient and pharmacy team. Barriers identified: None at this time.   Length of Stay: Nettie, NP  8:19  AM  Advanced Heart Failure Team Pager [phone] (M-F; 7a - 4p)  Please contact [facility] for night-coverage after hours (4p -7a ) and weekends on [email or web address]  Patient seen and examined with the above-signed Advanced Practice Provider and/or Housestaff. I personally reviewed laboratory data, imaging studies and relevant notes. I independently examined the patient and formulated the important aspects of the plan. I have edited the note to reflect any of my changes or salient points. I have personally discussed the plan with the patient and/or family.  He remains very tenuous. Now on po amio. Looks to be back in AFL this am. Co-ox marginal despite milrinone. Would continue milrinone for now. Have reviewed with EP and will assess for possible AFLablation though I suspect it is left sided. Options quite limited. Continue po torsemide. Ambulate. Will need ongoing therapy for myeloma.   Glori Bickers, MD  9:48 AM

## 2017-11-13 ENCOUNTER — Inpatient Hospital Stay: Payer: Medicare HMO

## 2017-11-13 LAB — GLUCOSE, CAPILLARY
GLUCOSE-CAPILLARY: 144 mg/dL — AB (ref 70–99)
GLUCOSE-CAPILLARY: 199 mg/dL — AB (ref 70–99)
GLUCOSE-CAPILLARY: 282 mg/dL — AB (ref 70–99)
GLUCOSE-CAPILLARY: 325 mg/dL — AB (ref 70–99)
Glucose-Capillary: 73 mg/dL (ref 70–99)

## 2017-11-13 LAB — BASIC METABOLIC PANEL
Anion gap: 14 (ref 5–15)
BUN: 38 mg/dL — AB (ref 8–23)
CO2: 38 mmol/L — ABNORMAL HIGH (ref 22–32)
CREATININE: 1.93 mg/dL — AB (ref 0.61–1.24)
Calcium: 9.2 mg/dL (ref 8.9–10.3)
Chloride: 89 mmol/L — ABNORMAL LOW (ref 98–111)
GFR calc Af Amer: 37 mL/min — ABNORMAL LOW (ref >60)
GFR, EST NON AFRICAN AMERICAN: 32 mL/min — AB (ref >60)
Glucose, Bld: 137 mg/dL — ABNORMAL HIGH (ref 70–99)
POTASSIUM: 3.7 mmol/L (ref 3.5–5.1)
SODIUM: 141 mmol/L (ref 135–145)

## 2017-11-13 LAB — COOXEMETRY PANEL
Carboxyhemoglobin: 1.4 % (ref 0.5–1.5)
Methemoglobin: 1.4 % (ref 0.0–1.5)
O2 SAT: 61.6 %
TOTAL HEMOGLOBIN: 13.4 g/dL (ref 12.0–16.0)

## 2017-11-13 MED ORDER — SODIUM CHLORIDE 0.9 % IV BOLUS
500.0000 mL | Freq: Once | INTRAVENOUS | Status: AC
  Administered 2017-11-13: 500 mL via INTRAVENOUS

## 2017-11-13 MED ORDER — TORSEMIDE 20 MG PO TABS: 40.0000 mg | ORAL_TABLET | Freq: Two times a day (BID) | ORAL | Status: DC

## 2017-11-13 NOTE — Progress Notes (Signed)
Physical Therapy Treatment Patient Details Name: Dillon Andrews MRN: [id number] DOB: 1940-05-22 Today's Date: 11/13/2017    History of Present Illness Pt is a 77 y/o male admitted 11/05/17 secondary to worsening SOB and chest tightness; found to have a fib with RVR. S/p TEE and cardioversion 7/29. 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 PMH inlcudes kappa light chain myeloma (currently undergoing chemotherapy), CHF, nonischemic cardiomyopathy, pulmonary HTN, CKD.     PT Comments    Pt making good progress. No dizziness this afternoon.    Follow Up Recommendations  Home health PT;Supervision for mobility/OOB     Equipment Recommendations  None recommended by PT    Recommendations for Other Services       Precautions / Restrictions Precautions Precaution Comments: watch HR; target HR 65-105 bpm Restrictions Weight Bearing Restrictions: No    Mobility  Bed Mobility Overal bed mobility: Modified Independent Bed Mobility: Supine to Sit;Sit to Supine     Supine to sit: Modified independent (Device/Increase time) Sit to supine: Modified independent (Device/Increase time)      Transfers Overall transfer level: Needs assistance Equipment used: 4-wheeled walker Transfers: Sit to/from Stand Sit to Stand: Supervision         General transfer comment: supervision for safety  Ambulation/Gait Ambulation/Gait assistance: Supervision Gait Distance (Feet): 350 Feet Assistive device: 4-wheeled walker Gait Pattern/deviations: Step-through pattern;Decreased stride length Gait velocity: decr Gait velocity interpretation: 1.31 - 2.62 ft/sec, indicative of limited community ambulator General Gait Details: Supervision for safety. No rest breaks. HR 85-95   Stairs             Wheelchair Mobility    Modified Rankin (Stroke Patients Only)       Balance Overall balance assessment: Needs assistance Sitting-balance support: No upper  extremity supported;Feet supported Sitting balance-Leahy Scale: Good     Standing balance support: No upper extremity supported Standing balance-Leahy Scale: Fair                              Cognition Arousal/Alertness: Awake/alert Behavior During Therapy: WFL for tasks assessed/performed Overall Cognitive Status: Within Functional Limits for tasks assessed                                        Exercises      General Comments General comments (skin integrity, edema, etc.): Pt's daughter present      Pertinent Vitals/Pain Pain Assessment: No/denies pain    Home Living                      Prior Function            PT Goals (current goals can now be found in the care plan section) Progress towards PT goals: Progressing toward goals;Goals met and updated - see care plan    Frequency    Min 3X/week      PT Plan Current plan remains appropriate    Co-evaluation              AM-PAC PT "6 Clicks" Daily Activity  Outcome Measure  Difficulty turning over in bed (including adjusting bedclothes, sheets and blankets)?: None Difficulty moving from lying on back to sitting on the side of the bed? : None Difficulty sitting down on and standing up from a chair with arms (  e.g., wheelchair, bedside commode, etc,.)?: A Little Help needed moving to and from a bed to chair (including a wheelchair)?: A Little Help needed walking in hospital room?: A Little Help needed climbing 3-5 steps with a railing? : A Lot 6 Click Score: 19    End of Session Equipment Utilized During Treatment: Gait belt Activity Tolerance: Patient tolerated treatment well Patient left: in bed;with call bell/phone within reach;with family/visitor present   PT Visit Diagnosis: Unsteadiness on feet (R26.81);Muscle weakness (generalized) (M62.81);Other abnormalities of gait and mobility (R26.89)     Time: 5427-0623 PT Time Calculation (min) (ACUTE ONLY): 15  min  Charges:  $Gait Training: 8-22 mins                     [facility] PT [place] 11/13/2017, 1:51 PM

## 2017-11-13 NOTE — Progress Notes (Signed)
PT Cancellation Note  Patient Details Name: Dillon Andrews MRN: [id number] DOB: 1941/01/07   Cancelled Treatment:    Reason Eval/Treat Not Completed: Other (comment). Pt in bathroom and family member reports pt is very dizzy this AM. Will check on later today.    [place] 11/13/2017, 9:30 AM Suanne Marker PT [phone]

## 2017-11-13 NOTE — Progress Notes (Addendum)
Advanced Heart Failure Rounding Note  PCP-Cardiologist: Glenetta Hew, MD   Subjective:    Brisk diuresis with -2.6 L on PO torsemide. Weight unchanged. Creatinine bumped 1.77 > 1.93. CVP 6-7   Coox 62%. Remains on milrinone 0.125 mcg/kg/min.    Underwent successful TEE/DCCV 11/10/17. Converted back into atrial flutter. He is not an ablation candidate per Dr Curt Bears. Remains in aflutter 90s. On apixaban - no bleeding  Feels fine this morning. No CP, SOB. Some dizziness with raising his head. Walked in hallways with no problems.   Objective:   Weight Range: 170 lb (77.1 kg) Body mass index is 25.1 kg/m.   Vital Signs:   Temp:  [97.7 F (36.5 C)-98 F (36.7 C)] 97.7 F (36.5 C) (07/31 2149) Pulse Rate:  [85-94] 85 (08/01 0539) Resp:  [17-27] 17 (08/01 0539) BP: (94-120)/(67-87) 103/71 (08/01 0539) SpO2:  [93 %-97 %] 97 % (08/01 0539) Weight:  [170 lb (77.1 kg)] 170 lb (77.1 kg) (08/01 0539) Last BM Date: 11/10/17  Weight change: Filed Weights   11/11/17 0531 11/12/17 0540 11/13/17 0539  Weight: 171 lb 12.8 oz (77.9 kg) 170 lb 4.8 oz (77.2 kg) 170 lb (77.1 kg)    Intake/Output:   Intake/Output Summary (Last 24 hours) at 11/13/2017 0721 Last data filed at 11/13/2017 0600 Gross per 24 hour  Intake 847.07 ml  Output 2650 ml  Net -1802.93 ml      Physical Exam    CVP 6-7 General:Elderly appearing. Thin. No resp difficulty. HEENT: Normal anicteric  Neck: Supple. JVP 6-7, prominent CV waves. Carotids 2+ bilat; no bruits. No thyromegaly or nodule noted. Cor: PMI nondisplaced. IRR, 2/6 TR, +S3 Lungs: CTAB, normal effort. No wheeze.  Abdomen: Soft, non-tender, non-distended, no HSM. No bruits or masses. +BS  Extremities: No cyanosis, clubbing, or rash. R and LLE no edema. RUE PICC.  Neuro: Alert & orientedx3, cranial nerves grossly intact. moves all 4 extremities w/o difficulty. Affect pleasant   Telemetry   Aflutter 80-90s, short run of RVR 140s.  Personally  reviewed.   EKG    11/12/17: atrial flutter, 88 bpm. Personally reviewed.   Labs    CBC Recent Labs    11/11/17 0546  WBC 11.6*  HGB 12.3*  HCT 39.4  MCV 89.3  PLT 546   Basic Metabolic Panel Recent Labs    11/11/17 0546 11/12/17 0610 11/13/17 0500  NA 139 140 141  K 3.8 3.6 3.7  CL 92* 93* 89*  CO2 36* 35* 38*  GLUCOSE 118* 138* 137*  BUN 40* 40* 38*  CREATININE 1.69* 1.77* 1.93*  CALCIUM 8.8* 8.7* 9.2  MG 2.1 1.9  --    Liver Function Tests No results for input(s): AST, ALT, ALKPHOS, BILITOT, PROT, ALBUMIN in the last 72 hours. No results for input(s): LIPASE, AMYLASE in the last 72 hours. Cardiac Enzymes No results for input(s): CKTOTAL, CKMB, CKMBINDEX, TROPONINI in the last 72 hours.  BNP: BNP (last 3 results) Recent Labs    06/25/17 1207 09/15/17 1401 11/05/17 1310  BNP 420.9* 393.0* 515.3*    ProBNP (last 3 results) No results for input(s): PROBNP in the last 8760 hours.   D-Dimer No results for input(s): DDIMER in the last 72 hours. Hemoglobin A1C No results for input(s): HGBA1C in the last 72 hours. Fasting Lipid Panel No results for input(s): CHOL, HDL, LDLCALC, TRIG, CHOLHDL, LDLDIRECT in the last 72 hours. Thyroid Function Tests No results for input(s): TSH, T4TOTAL, T3FREE, THYROIDAB in the last  72 hours.  Invalid input(s): FREET3  Other results:   Imaging    No results found.   Medications:     Scheduled Medications: . amiodarone  400 mg Oral BID  . apixaban  5 mg Oral BID  . dronabinol  2.5 mg Oral BID AC  . famciclovir  250 mg Oral Daily  . gabapentin  800 mg Oral QHS  . insulin aspart  0-15 Units Subcutaneous TID WC  . insulin aspart  0-5 Units Subcutaneous QHS  . insulin aspart  5 Units Subcutaneous TID WC  . LORazepam  1 mg Oral Once  . magnesium oxide  400 mg Oral Daily  . Melatonin  6 mg Oral QHS  . pantoprazole  40 mg Oral Daily  . rosuvastatin  10 mg Oral Daily  . sodium chloride flush  10-40 mL  Intracatheter Q12H  . sodium chloride flush  3 mL Intravenous Q12H  . spironolactone  25 mg Oral Daily  . torsemide  40 mg Oral BID  . traZODone  50 mg Oral QHS    Infusions: . sodium chloride 7 mL/hr at 11/13/17 0600  . milrinone 0.125 mcg/kg/min (11/13/17 0600)    PRN Medications: sodium chloride, acetaminophen, LORazepam, ondansetron (ZOFRAN) IV, sodium chloride flush, sodium chloride flush, zolpidem    Patient Profile   Dillon MATICH is a 77 y.o. male with a hx of chronic combined CHF due to NICM, CKD III,presumedNASH(per pt, never proven),esophageal varices per chart(prior glass of wine occasionally but no heavy alcohol),HTN, HLD, DM, Barrett's esophagus, GERD, anemia, RBBB, TTR amyloidosis, and Kappa Light Chain myeloma.  Admitted to [facility] 11/05/17 in setting of new Afib with RVR.   Assessment/Plan   1. Acute on chronic combined HF with TTR amyloidosis (Wild Type) - NICM by cath at [facility] 09/2016 with EF 40-45%. Echo 06/2017: EF 25-30%, mild LAE, mild/mod RV dilation, mod/severe RA. - PYP scan strongly suggestive of TTR amyloid, SPEP with 0.8% m-spike, UPEP with Bence Jones Protein positive, kappa type. 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 - Now also diagnosed with multiple myeloma so suspect this may be AL amyloid but wouldn't expect PYP to be positive. She may have both TTR and AL amyloid but this would be unusual  -Has been approved for Tafamidis, but his co-pay is $1000, which he cannot afford. Will defer for now in setting of treatment for MM - Volume status stable. CVP 6-7. Creatinine bumped to 1.93. Hold torsemide this morning.  - Co-ox 62%. Continue milrinone 0.125 mcg/kg/min for now.  - Carvedilol on hold with marginal co-ox. On PO amio for AFL  - Continue spiro 25 mg daily - No ARB/entresto with CKD  2. New onset AFlutter  - likely triggered HF decompensation  - s/p TEE DCCV 7/29. Remains in NSR . Continue amio 400 mg BID. - On Eliquis 5 bid.  Denies bleeding. - CHA2DS2/VASc is at least 6. (CHF, HTN, DM, CAD (mild, non-obstructive), and 2 for Age) - Back in atrial flutter 7/31. EP has said she is not an ablation candidate.  - Will discuss possible repeat DCCV with Dr [name] vs weaning milrinone.   3. CKD III, baseline ~1.6 -Creatinine slight bump 1.69 > 1.77 > 1.93. Hold torsemide as above.   4. Recurrent bilateral effusions - Somewhat increased from March on CXR 11/05/17. Follow with diuresis. No change.   5. Kappa Lightchain Myeloma - Follows with Dr. Marin Olp who has seen this admission.  - Started on Bortezomib7/18/18andRevlimid7/23/19. - Continue management  per primary team. No change  6. NASH with esophageal varices.  - Watch closely for bleeding with AC. Denies bleeding.  - Last EGD 06/21/2014 at [facility] for surveillance of Esophageal varices, Barrett's esophagus, and PMHx of Colon polyps. Showed two columns of small esophageal varices w/out stigmata, short segment Barrett's esophagus (without biopsies, due to presence of EVs), 5cm hiatal hernia, and hyperplastic small gastric nodules  7. DM2 - Per primary. No change.   8. Hypokalemia. - K 3.7. Supp.   Medication concerns reviewed with patient and pharmacy team. Barriers identified: None at this time.   Length of Stay: [place], NP  7:21 AM  Advanced Heart Failure Team Pager [phone] (M-F; 7a - 4p)  Please contact [facility] for night-coverage after hours (4p -7a ) and weekends on [email or web address]  Patient seen and examined with the above-signed Advanced Practice Provider and/or Housestaff. I personally reviewed laboratory data, imaging studies and relevant notes. I independently examined the patient and formulated the important aspects of the plan. I have edited the note to reflect any of my changes or salient points. I have personally discussed the plan with the patient and/or family.  He remains very tenuous. Remains in AF. Tracings  reviewed with EP and felt to be left-sided AFL and thus not easily ablatable. Will continue amiodarone and apixaban. Plan repeat DC-CV tomorrow.Creatinine up. Stop torsemide. Continue milrinone. Will attempt to wean tomorrow if renal function stable.   Glori Bickers, MD  8:19 AM

## 2017-11-13 NOTE — Progress Notes (Signed)
[facility]                                                                              Patient Demographics  Dillon Andrews, is a 77 y.o. male, DOB - 02-21-1941, IBB:[id number]  Admit date - 11/05/2017   Admitting Physician Paticia Stack, MD  Outpatient Primary MD for the patient is [name], MD  Outpatient specialists:   LOS - 8  days   Medical records reviewed and are as summarized below:    Chief Complaint  Patient presents with  . Chest Pain  . Shortness of Breath       Brief summary   Patient is a 77 year old male with past medical history of liver cirrhosis with esophageal varices, combined systolic and diastolic CHF with ejection fraction of 25 to 30%, nonischemic cardiomyopathy, pulmonary hypertension, cardiac amyloidosis, CKD stage III, kappa light chain myeloma on chemotherapy was brought to the emergency department due to shortness of breath.  Patient was found to be in A. fib with RVR with hypotension on presentation.  Cardiology was consulted.  Started on amiodarone drip.  Also started on heparin for new onset A. fib.    CHF team following, cardioversion on 7/29.  Assessment & Plan    Principal problem Acute on chronic combined systolic and diastolic CHF, amyloidosis -2D echo 06/2017 showed EF of 25 to 30% with mild LAE, mild to moderate RV dilation moderate to severe RA. PYP scan suggestive of TTR amyloid. -Negative balance of 12.0 L, weight down from 187--> 170lbs -CHF team following, IV Lasix discontinued, patient was placed on torsemide and continued on IV milrinone.   -Felt dizzy this morning, holding torsemide today.  Coreg on hold, no ARB/Entresto with CKD  Active problems Acute new onset atrial fibrillation with RVR -Status post TEE DCCV on 7/29, briefly returned to normal sinus rhythm  - CHADS VASC 6, continue Eliquis -IV amiodarone transitioned to oral, per EP not an ablation candidate -Possible repeat DCCV  Liver  cirrhosis with esophageal varices -Currently no signs of bleeding, monitor closely on anticoagulation -Last EGD 06/21/2014 at [facility] showed esophageal varices, Barrett's esophagus, likely [name]. -Follow outpatient with gastroenterology  Kappa light chain myeloma -Follows Dr. Marin Olp, just had chemotherapy few days ago, was started on bortezomib on 10/20/2016 and Revlimid on 11/04/2017 -Outpatient follow-up with Dr. Marin Olp  Mild acute on CKD stage III -Baseline around 1.6 -Creatinine 1.9, holding torsemide    Diabetes mellitus type 2 -CBGs improving, continue sliding scale insulin, NovoLog meal coverage   Code Status: Full CODE STATUS DVT Prophylaxis: Apixaban Family Communication: Discussed in detail with the patient, all imaging results, lab results explained to the patient and wife at the bedside   Disposition Plan: Once cleared by cardiology Time Spent in minutes 25 minutes  Procedures:  TEE  Consultants:   CHF team  Antimicrobials:      Medications  Scheduled Meds: . amiodarone  400 mg Oral BID  . apixaban  5 mg Oral BID  . dronabinol  2.5 mg Oral BID AC  . famciclovir  250 mg Oral Daily  . gabapentin  800 mg Oral  QHS  . insulin aspart  0-15 Units Subcutaneous TID WC  . insulin aspart  0-5 Units Subcutaneous QHS  . insulin aspart  5 Units Subcutaneous TID WC  . LORazepam  1 mg Oral Once  . magnesium oxide  400 mg Oral Daily  . Melatonin  6 mg Oral QHS  . pantoprazole  40 mg Oral Daily  . rosuvastatin  10 mg Oral Daily  . sodium chloride flush  10-40 mL Intracatheter Q12H  . sodium chloride flush  3 mL Intravenous Q12H  . spironolactone  25 mg Oral Daily  . traZODone  50 mg Oral QHS   Continuous Infusions: . sodium chloride Stopped (11/13/17 0913)  . milrinone 0.125 mcg/kg/min (11/13/17 1017)   PRN Meds:.sodium chloride, acetaminophen, LORazepam, ondansetron (ZOFRAN) IV, sodium chloride flush, sodium chloride flush, zolpidem   Antibiotics    Anti-infectives (From admission, onward)   Start     Dose/Rate Route Frequency Ordered Stop   11/06/17 1000  famciclovir [facility]) tablet 250 mg     250 mg Oral Daily 11/05/17 1732          Subjective:   Craven Crean was seen and examined today.  States felt dizzy earlier this morning, no chest pain or shortness of breath.   Denies any abdominal pain, N/V/D/C, new weakness, numbess, tingling. No acute events overnight.    Objective:   Vitals:   11/13/17 0539 11/13/17 0754 11/13/17 0803 11/13/17 1104  BP: 103/71  103/76 103/71  Pulse: 85  85 86  Resp: 17 (!) 23 18 (!) 27  Temp:   98.3 F (36.8 C) 98.2 F (36.8 C)  TempSrc:   Oral Oral  SpO2: 97% 98% 98% 94%  Weight: 77.1 kg (170 lb)     Height:        Intake/Output Summary (Last 24 hours) at 11/13/2017 1445 Last data filed at 11/13/2017 1017 Gross per 24 hour  Intake 777.3 ml  Output 2650 ml  Net -1872.7 ml     Wt Readings from Last 3 Encounters:  11/13/17 77.1 kg (170 lb)  10/22/17 84.3 kg (185 lb 12.8 oz)  09/29/17 85.4 kg (188 lb 4 oz)     Exam   General: Alert and oriented x 3, NAD, pleasant and cooperative  Eyes:   HEENT:  Atraumatic, normocephalic  Cardiovascular: S1 S2 auscultated, irregularly regular   respiratory: Clear to auscultation bilaterally, no wheezing, rales or rhonchi  Gastrointestinal: Soft, nontender, nondistended, + bowel sounds  Ext: no pedal edema bilaterally  Neuro: no new deficits  Musculoskeletal: No digital cyanosis, clubbing  Skin: No rashes  Psych: Normal affect and demeanor, alert and oriented x3     Data Reviewed:  I have personally reviewed following labs and imaging studies  Micro Results Recent Results (from the past 240 hour(s))  Culture, Urine     Status: Abnormal   Collection Time: 11/05/17  4:24 PM  Result Value Ref Range Status   Specimen Description URINE, CLEAN CATCH  Final   Special Requests NONE  Final   Culture (A)  Final    <10,000  COLONIES/mL INSIGNIFICANT GROWTH Performed at [facility], [address] [place], [place] [address]    Report Status 11/06/2017 FINAL  Final  MRSA PCR Screening     Status: None   Collection Time: 11/05/17  9:52 PM  Result Value Ref Range Status   MRSA by PCR NEGATIVE NEGATIVE Final    Comment:        The  GeneXpert MRSA Assay (FDA approved for NASAL specimens only), is one component of a comprehensive MRSA colonization surveillance program. It is not intended to diagnose MRSA infection nor to guide or monitor treatment for MRSA infections. Performed at [facility], [place] [address], [place], [place] [address]     Radiology Reports Dg Chest 2 View  Result Date: 11/05/2017 CLINICAL DATA:  Mid chest tightness and shortness of breath for 2 days. EXAM: CHEST - 2 VIEW COMPARISON:  Single-view of the chest 09/15/2017. PA and lateral chest 07/10/2017. CT chest 03/20/2017. FINDINGS: There is cardiomegaly without edema. Small bilateral pleural effusions are seen on all the comparison examinations and have slightly increased since the most recent study. There is associated basilar atelectasis. Aortic atherosclerosis is noted. No acute or focal bony abnormality. IMPRESSION: Chronic small bilateral pleural effusions have increased somewhat since the most recent examination. Cardiomegaly without edema. Atherosclerosis. Electronically Signed   By: Inge Rise M.D.   On: 11/05/2017 12:35   Dg Chest Port 1 View  Result Date: 11/11/2017 CLINICAL DATA:  PICC line placement EXAM: PORTABLE CHEST 1 VIEW COMPARISON:  11/05/2017 FINDINGS: Right PICC line is in place with the tip at the cavoatrial junction. Cardiomegaly. Small bilateral effusions with bibasilar atelectasis or infiltrates, slightly worsened on the right since prior study. IMPRESSION: Right PICC line tip at the cavoatrial junction. Bilateral effusions and bibasilar atelectasis or infiltrates, right greater than left, worsening  on the right since prior study. Electronically Signed   By: Rolm Baptise M.D.   On: 11/11/2017 10:48   [place] Ekg Site Rite  Result Date: 11/07/2017 If Site Rite image not attached, placement could not be confirmed due to current cardiac rhythm.   Lab Data:  CBC: Recent Labs  Lab 11/07/17 0505 11/11/17 0546  WBC 10.8* 11.6*  HGB 13.1 12.3*  HCT 41.6 39.4  MCV 89.3 89.3  PLT 141* 812   Basic Metabolic Panel: Recent Labs  Lab 11/07/17 0505  11/09/17 0516 11/10/17 0530 11/11/17 0546 11/12/17 0610 11/13/17 0500  NA 141   < > 140 140 139 140 141  K 3.6   < > 4.0 3.6 3.8 3.6 3.7  CL 97*   < > 94* 92* 92* 93* 89*  CO2 30   < > 33* 36* 36* 35* 38*  GLUCOSE 142*   < > 205* 122* 118* 138* 137*  BUN 42*   < > 37* 36* 40* 40* 38*  CREATININE 1.63*   < > 1.71* 1.61* 1.69* 1.77* 1.93*  CALCIUM 9.0   < > 8.6* 8.9 8.8* 8.7* 9.2  MG 2.0  --   --   --  2.1 1.9  --    < > = values in this interval not displayed.   GFR: Estimated Creatinine Clearance: 31.5 mL/min (A) (by C-G formula based on SCr of 1.93 mg/dL (H)). Liver Function Tests: No results for input(s): AST, ALT, ALKPHOS, BILITOT, PROT, ALBUMIN in the last 168 hours. No results for input(s): LIPASE, AMYLASE in the last 168 hours. No results for input(s): AMMONIA in the last 168 hours. Coagulation Profile: No results for input(s): INR, PROTIME in the last 168 hours. Cardiac Enzymes: No results for input(s): CKTOTAL, CKMB, CKMBINDEX, TROPONINI in the last 168 hours. BNP (last 3 results) No results for input(s): PROBNP in the last 8760 hours. HbA1C: No results for input(s): HGBA1C in the last 72 hours. CBG: Recent Labs  Lab 11/12/17 1137 11/12/17 1623 11/12/17 2150 11/13/17 0757 11/13/17 1103  GLUCAP 165* 78 144* 144* 199*   Lipid Profile: No results for input(s): CHOL, HDL, LDLCALC, TRIG, CHOLHDL, LDLDIRECT in the last 72 hours. Thyroid Function Tests: No results for input(s): TSH, T4TOTAL, FREET4, T3FREE, THYROIDAB  in the last 72 hours. Anemia Panel: No results for input(s): VITAMINB12, FOLATE, FERRITIN, TIBC, IRON, RETICCTPCT in the last 72 hours. Urine analysis:    Component Value Date/Time   COLORURINE YELLOW 11/05/2017 1624   APPEARANCEUR CLEAR 11/05/2017 1624   LABSPEC 1.012 11/05/2017 1624   PHURINE 5.0 11/05/2017 1624   GLUCOSEU NEGATIVE 11/05/2017 1624   HGBUR NEGATIVE 11/05/2017 1624   BILIRUBINUR NEGATIVE 11/05/2017 1624   KETONESUR NEGATIVE 11/05/2017 1624   PROTEINUR NEGATIVE 11/05/2017 1624   UROBILINOGEN 1.0 06/06/2008 0949   NITRITE NEGATIVE 11/05/2017 1624   LEUKOCYTESUR NEGATIVE 11/05/2017 1624     Heather Streeper M.D. [facility] 11/13/2017, 2:45 PM  Pager: [phone] Between 7am to 7pm - call Pager - [phone]  After 7pm go to [email or web address] - password [email or web address]  Call night coverage person covering after 7pm

## 2017-11-14 ENCOUNTER — Encounter (HOSPITAL_COMMUNITY): Payer: Medicare HMO

## 2017-11-14 ENCOUNTER — Other Ambulatory Visit: Payer: Self-pay

## 2017-11-14 ENCOUNTER — Inpatient Hospital Stay (HOSPITAL_COMMUNITY): Payer: Medicare HMO | Admitting: Anesthesiology

## 2017-11-14 ENCOUNTER — Encounter (HOSPITAL_COMMUNITY)
Admission: EM | Disposition: A | Discharge status: Home Health | Payer: Self-pay | Source: Home / Self Care | Attending: Internal Medicine

## 2017-11-14 ENCOUNTER — Encounter (HOSPITAL_COMMUNITY): Payer: Self-pay

## 2017-11-14 DIAGNOSIS — N289 Disorder of kidney and ureter, unspecified: Secondary | ICD-10-CM

## 2017-11-14 HISTORY — PX: CARDIOVERSION: SHX1299

## 2017-11-14 LAB — BASIC METABOLIC PANEL
ANION GAP: 11 (ref 5–15)
BUN: 37 mg/dL — ABNORMAL HIGH (ref 8–23)
CALCIUM: 8.7 mg/dL — AB (ref 8.9–10.3)
CO2: 36 mmol/L — AB (ref 22–32)
Chloride: 93 mmol/L — ABNORMAL LOW (ref 98–111)
Creatinine, Ser: 1.76 mg/dL — ABNORMAL HIGH (ref 0.61–1.24)
GFR calc non Af Amer: 35 mL/min — ABNORMAL LOW (ref >60)
GFR, EST AFRICAN AMERICAN: 41 mL/min — AB (ref >60)
GLUCOSE: 95 mg/dL (ref 70–99)
POTASSIUM: 3.6 mmol/L (ref 3.5–5.1)
Sodium: 140 mmol/L (ref 135–145)

## 2017-11-14 LAB — CBC
HEMATOCRIT: 38.8 % — AB (ref 39.0–52.0)
HEMOGLOBIN: 12.2 g/dL — AB (ref 13.0–17.0)
MCH: 27.8 pg (ref 26.0–34.0)
MCHC: 31.4 g/dL (ref 30.0–36.0)
MCV: 88.4 fL (ref 78.0–100.0)
Platelets: 188 10*3/uL (ref 150–400)
RBC: 4.39 MIL/uL (ref 4.22–5.81)
RDW: 17.8 % — AB (ref 11.5–15.5)
WBC: 10.9 10*3/uL — AB (ref 4.0–10.5)

## 2017-11-14 LAB — COOXEMETRY PANEL
Carboxyhemoglobin: 1.7 % — ABNORMAL HIGH (ref 0.5–1.5)
METHEMOGLOBIN: 0.9 % (ref 0.0–1.5)
O2 Saturation: 75 %
TOTAL HEMOGLOBIN: 12.7 g/dL (ref 12.0–16.0)

## 2017-11-14 LAB — GLUCOSE, CAPILLARY
GLUCOSE-CAPILLARY: 119 mg/dL — AB (ref 70–99)
GLUCOSE-CAPILLARY: 252 mg/dL — AB (ref 70–99)
Glucose-Capillary: 111 mg/dL — ABNORMAL HIGH (ref 70–99)
Glucose-Capillary: 132 mg/dL — ABNORMAL HIGH (ref 70–99)

## 2017-11-14 SURGERY — CARDIOVERSION
Anesthesia: General

## 2017-11-14 MED ORDER — PHENYLEPHRINE HCL 10 MG/ML IJ SOLN
INTRAMUSCULAR | Status: DC | PRN
  Administered 2017-11-14: 40 ug via INTRAVENOUS
  Administered 2017-11-14: 80 ug via INTRAVENOUS

## 2017-11-14 MED ORDER — TAFAMIDIS MEGLUMINE (CARDIAC) 20 MG PO CAPS
80.0000 mg | ORAL_CAPSULE | Freq: Every day | ORAL | Status: DC
  Filled 2017-11-14 (×3): qty 1

## 2017-11-14 MED ORDER — SERTRALINE HCL 25 MG PO TABS
25.0000 mg | ORAL_TABLET | Freq: Every day | ORAL | Status: DC
Start: 2017-11-14 — End: 2017-11-27
  Administered 2017-11-14 – 2017-11-27 (×14): 25 mg via ORAL
  Filled 2017-11-14 (×14): qty 1

## 2017-11-14 MED ORDER — TAFAMIDIS MEGLUMINE (CARDIAC) 20 MG PO CAPS
4.0000 | ORAL_CAPSULE | Freq: Every day | ORAL | Status: DC
  Administered 2017-11-14 – 2017-11-26 (×13): 4 via ORAL
  Filled 2017-11-14 (×16): qty 4

## 2017-11-14 MED ORDER — LIDOCAINE HCL (CARDIAC) PF 50 MG/5ML IV SOSY
PREFILLED_SYRINGE | INTRAVENOUS | Status: DC | PRN
  Administered 2017-11-14: 40 mg via INTRAVENOUS

## 2017-11-14 MED ORDER — POTASSIUM CHLORIDE CRYS ER 20 MEQ PO TBCR
20.0000 meq | EXTENDED_RELEASE_TABLET | Freq: Once | ORAL | Status: AC
  Administered 2017-11-14: 20 meq via ORAL

## 2017-11-14 MED ORDER — TORSEMIDE 20 MG PO TABS
20.0000 mg | ORAL_TABLET | Freq: Every day | ORAL | Status: DC
  Administered 2017-11-15 – 2017-11-16 (×2): 20 mg via ORAL
  Filled 2017-11-14 (×2): qty 1

## 2017-11-14 MED ORDER — PROPOFOL 10 MG/ML IV BOLUS
INTRAVENOUS | Status: DC | PRN
  Administered 2017-11-14: 30 mg via INTRAVENOUS

## 2017-11-14 MED ORDER — TORSEMIDE 20 MG PO TABS
40.0000 mg | ORAL_TABLET | Freq: Every day | ORAL | Status: DC
  Administered 2017-11-15 – 2017-11-17 (×3): 40 mg via ORAL
  Filled 2017-11-14 (×3): qty 2

## 2017-11-14 NOTE — Progress Notes (Signed)
[facility]                                                                              Patient Demographics  Dillon Andrews, is a 77 y.o. male, DOB - [birth year], XMD:[id number]  Admit date - 11/05/2017   Admitting Physician Paticia Stack, MD  Outpatient Primary MD for the patient is [name], MD  Outpatient specialists:   LOS - 9  days   Medical records reviewed and are as summarized below:    Chief Complaint  Patient presents with  . Chest Pain  . Shortness of Breath       Brief summary   Patient is a 77 year old male with past medical history of liver cirrhosis with esophageal varices, combined systolic and diastolic CHF with ejection fraction of 25 to 30%, nonischemic cardiomyopathy, pulmonary hypertension, cardiac amyloidosis, CKD stage III, kappa light chain myeloma on chemotherapy was brought to the emergency department due to shortness of breath.  Patient was found to be in A. fib with RVR with hypotension on presentation.  Cardiology was consulted.  Started on amiodarone drip.  Also started on heparin for new onset A. fib.    CHF team following, cardioversion on 7/29.  Assessment & Plan    Principal problem Acute on chronic combined systolic and diastolic CHF, amyloidosis -2D echo 06/2017 showed EF of 25 to 30% with mild LAE, mild to moderate RV dilation moderate to severe RA. PYP scan suggestive of TTR amyloid. -Negative balance of 13 L, weight down from 187--> 171lbs  -CHF team following, IV Lasix discontinued, patient was placed on torsemide and continued on IV milrinone.   -Patient had TEE DCCV on 7/29 however reverted back to A. fib, plan for cardioversion today  Active problems Acute new onset atrial fibrillation with RVR -Status post TEE DCCV on 7/29, briefly returned to normal sinus rhythm  - CHADS VASC 6, continue Eliquis -IV amiodarone transitioned to oral, per EP not an ablation candidate -Plan for cardioversion  today  Liver cirrhosis with esophageal varices -Currently no signs of bleeding, monitor closely on anticoagulation -Last EGD 06/21/2014 at [facility] showed esophageal varices, Barrett's esophagus, likely [name]. -Follow outpatient with gastroenterology  Kappa light chain myeloma -Follows Dr. Marin Olp, just had chemotherapy few days ago, was started on bortezomib on 10/20/2016 and Revlimid on 11/04/2017 -Dr. Marin Olp following  Mild acute on CKD stage III -Baseline around 1.6 -Creatinine improving, 1.7 today   Diabetes mellitus type 2 -CBGs improving, continue sliding scale insulin, NovoLog meal coverage   Code Status: Full CODE STATUS DVT Prophylaxis: Apixaban Family Communication: Discussed in detail with the patient, all imaging results, lab results explained to the patient and wife at the bedside   Disposition Plan: Once cleared by cardiology Time Spent in minutes 25 minutes  Procedures:  TEE Repeat cardioversion on 8/2  Consultants:   CHF team  Antimicrobials:      Medications  Scheduled Meds: . amiodarone  400 mg Oral BID  . apixaban  5 mg Oral BID  . dronabinol  2.5 mg Oral BID AC  . famciclovir  250 mg Oral Daily  . gabapentin  800 mg Oral QHS  . insulin aspart  0-15 Units Subcutaneous TID WC  . insulin aspart  0-5 Units Subcutaneous QHS  . insulin aspart  5 Units Subcutaneous TID WC  . LORazepam  1 mg Oral Once  . magnesium oxide  400 mg Oral Daily  . Melatonin  6 mg Oral QHS  . pantoprazole  40 mg Oral Daily  . potassium chloride  20 mEq Oral Once  . rosuvastatin  10 mg Oral Daily  . sertraline  25 mg Oral Daily  . sodium chloride flush  10-40 mL Intracatheter Q12H  . sodium chloride flush  3 mL Intravenous Q12H  . spironolactone  25 mg Oral Daily  . traZODone  50 mg Oral QHS   Continuous Infusions: . sodium chloride 7 mL (11/14/17 0622)  . milrinone 0.125 mcg/kg/min (11/14/17 0617)   PRN Meds:.sodium chloride, acetaminophen, LORazepam, ondansetron  (ZOFRAN) IV, sodium chloride flush, sodium chloride flush, zolpidem   Antibiotics   Anti-infectives (From admission, onward)   Start     Dose/Rate Route Frequency Ordered Stop   11/06/17 1000  famciclovir [facility]) tablet 250 mg     250 mg Oral Daily 11/05/17 1732          Subjective:   Dillon Andrews was seen and examined today.  No chest pain, shortness of breath or dizziness this morning.  Denies any chest pain.  Plan for cardioversion today.    Objective:   Vitals:   11/14/17 1027 11/14/17 1030 11/14/17 1036 11/14/17 1114  BP: (!) 98/59 107/67 98/66 105/65  Pulse: 75 74 74 77  Resp: 19 20 (!) 22 (!) 23  Temp: 98 F (36.7 C)   97.9 F (36.6 C)  TempSrc: Oral   Oral  SpO2: 100% 100% 97% 99%  Weight:      Height:        Intake/Output Summary (Last 24 hours) at 11/14/2017 1232 Last data filed at 11/14/2017 1116 Gross per 24 hour  Intake 289.7 ml  Output 1375 ml  Net -1085.3 ml     Wt Readings from Last 3 Encounters:  11/14/17 77.6 kg (171 lb)  10/22/17 84.3 kg (185 lb 12.8 oz)  09/29/17 85.4 kg (188 lb 4 oz)     Exam   General: Alert and oriented x 3, NAD  Eyes:   HEENT:  Atraumatic, normocephalic  Cardiovascular: S1 S2 auscultated,  Regular rate and rhythm. No pedal edema b/l  Respiratory: Clear to auscultation bilaterally, no wheezing, rales or rhonchi  Gastrointestinal: Soft, nontender, nondistended, + bowel sounds  Ext: no pedal edema bilaterally  Neuro:   Musculoskeletal: No digital cyanosis, clubbing  Skin: No rashes  Psych: Normal affect and demeanor, alert and oriented x3     Data Reviewed:  I have personally reviewed following labs and imaging studies  Micro Results Recent Results (from the past 240 hour(s))  Culture, Urine     Status: Abnormal   Collection Time: 11/05/17  4:24 PM  Result Value Ref Range Status   Specimen Description URINE, CLEAN CATCH  Final   Special Requests NONE  Final   Culture (A)  Final    <10,000  COLONIES/mL INSIGNIFICANT GROWTH Performed at [facility], [address], [place], [place] [address]    Report Status 11/06/2017 FINAL  Final  MRSA PCR Screening     Status: None   Collection Time: 11/05/17  9:52 PM  Result Value Ref Range Status   MRSA by PCR NEGATIVE NEGATIVE Final  Comment:        The GeneXpert MRSA Assay (FDA approved for NASAL specimens only), is one component of a comprehensive MRSA colonization surveillance program. It is not intended to diagnose MRSA infection nor to guide or monitor treatment for MRSA infections. Performed at [facility], [place] [address], [place], [place] [address]     Radiology Reports Dg Chest 2 View  Result Date: 11/05/2017 CLINICAL DATA:  Mid chest tightness and shortness of breath for 2 days. EXAM: CHEST - 2 VIEW COMPARISON:  Single-view of the chest 09/15/2017. PA and lateral chest 07/10/2017. CT chest 03/20/2017. FINDINGS: There is cardiomegaly without edema. Small bilateral pleural effusions are seen on all the comparison examinations and have slightly increased since the most recent study. There is associated basilar atelectasis. Aortic atherosclerosis is noted. No acute or focal bony abnormality. IMPRESSION: Chronic small bilateral pleural effusions have increased somewhat since the most recent examination. Cardiomegaly without edema. Atherosclerosis. Electronically Signed   By: Inge Rise M.D.   On: 11/05/2017 12:35   Dg Chest Port 1 View  Result Date: 11/11/2017 CLINICAL DATA:  PICC line placement EXAM: PORTABLE CHEST 1 VIEW COMPARISON:  11/05/2017 FINDINGS: Right PICC line is in place with the tip at the cavoatrial junction. Cardiomegaly. Small bilateral effusions with bibasilar atelectasis or infiltrates, slightly worsened on the right since prior study. IMPRESSION: Right PICC line tip at the cavoatrial junction. Bilateral effusions and bibasilar atelectasis or infiltrates, right greater than left, worsening  on the right since prior study. Electronically Signed   By: Rolm Baptise M.D.   On: 11/11/2017 10:48   [place] Ekg Site Rite  Result Date: 11/07/2017 If Site Rite image not attached, placement could not be confirmed due to current cardiac rhythm.   Lab Data:  CBC: Recent Labs  Lab 11/11/17 0546 11/14/17 0500  WBC 11.6* 10.9*  HGB 12.3* 12.2*  HCT 39.4 38.8*  MCV 89.3 88.4  PLT 170 683   Basic Metabolic Panel: Recent Labs  Lab 11/10/17 0530 11/11/17 0546 11/12/17 0610 11/13/17 0500 11/14/17 0500  NA 140 139 140 141 140  K 3.6 3.8 3.6 3.7 3.6  CL 92* 92* 93* 89* 93*  CO2 36* 36* 35* 38* 36*  GLUCOSE 122* 118* 138* 137* 95  BUN 36* 40* 40* 38* 37*  CREATININE 1.61* 1.69* 1.77* 1.93* 1.76*  CALCIUM 8.9 8.8* 8.7* 9.2 8.7*  MG  --  2.1 1.9  --   --    GFR: Estimated Creatinine Clearance: 34.6 mL/min (A) (by C-G formula based on SCr of 1.76 mg/dL (H)). Liver Function Tests: No results for input(s): AST, ALT, ALKPHOS, BILITOT, PROT, ALBUMIN in the last 168 hours. No results for input(s): LIPASE, AMYLASE in the last 168 hours. No results for input(s): AMMONIA in the last 168 hours. Coagulation Profile: No results for input(s): INR, PROTIME in the last 168 hours. Cardiac Enzymes: No results for input(s): CKTOTAL, CKMB, CKMBINDEX, TROPONINI in the last 168 hours. BNP (last 3 results) No results for input(s): PROBNP in the last 8760 hours. HbA1C: No results for input(s): HGBA1C in the last 72 hours. CBG: Recent Labs  Lab 11/13/17 1631 11/13/17 2123 11/13/17 2125 11/14/17 0755 11/14/17 1112  GLUCAP 73 282* 325* 111* 119*   Lipid Profile: No results for input(s): CHOL, HDL, LDLCALC, TRIG, CHOLHDL, LDLDIRECT in the last 72 hours. Thyroid Function Tests: No results for input(s): TSH, T4TOTAL, FREET4, T3FREE, THYROIDAB in the last 72 hours. Anemia Panel: No results for input(s): VITAMINB12,  FOLATE, FERRITIN, TIBC, IRON, RETICCTPCT in the last 72 hours. Urine analysis:     Component Value Date/Time   COLORURINE YELLOW 11/05/2017 1624   APPEARANCEUR CLEAR 11/05/2017 1624   LABSPEC 1.012 11/05/2017 1624   PHURINE 5.0 11/05/2017 1624   GLUCOSEU NEGATIVE 11/05/2017 1624   HGBUR NEGATIVE 11/05/2017 1624   BILIRUBINUR NEGATIVE 11/05/2017 1624   KETONESUR NEGATIVE 11/05/2017 1624   PROTEINUR NEGATIVE 11/05/2017 1624   UROBILINOGEN 1.0 06/06/2008 0949   NITRITE NEGATIVE 11/05/2017 1624   LEUKOCYTESUR NEGATIVE 11/05/2017 1624     Jareth Pardee M.D. [facility] 11/14/2017, 12:32 PM  Pager: [phone] Between 7am to 7pm - call Pager - [phone]  After 7pm go to [email or web address] - password [email or web address]  Call night coverage person covering after 7pm

## 2017-11-14 NOTE — H&P (View-Only) (Signed)
Advanced Heart Failure Rounding Note  PCP-Cardiologist: Glenetta Hew, MD   Subjective:    Received 500 cc bolus yesterday am with dizziness with increased creatinine. Torsemide held. Weight up 1 lb. Creatinine improving 1.93 > 1.76. CVP 8  Coox 75%. Remains on milrinone 0.125 mcg/kg/min.    Underwent successful TEE/DCCV 11/10/17.   Remains in Aflutter 80s on PO amio and eliquis. Going for repeat DCCV today.   Oncology saw this morning. Dr Marin Olp thinks he does not have AL amyloid and that he does have TTR amyloid and should be treated with tafamodis before chemo.   Says he feels a bit better today. Denies CP, SOB, or dizziness. No palpitations. Daughter says O2 sats dropped overnight to low 80s while he was sleeping.   Objective:   Weight Range: 171 lb 11.2 oz (77.9 kg) Body mass index is 25.36 kg/m.   Vital Signs:   Temp:  [97.4 F (36.3 C)-98.2 F (36.8 C)] 97.4 F (36.3 C) (08/02 0756) Pulse Rate:  [81-87] 81 (08/02 0756) Resp:  [17-27] 18 (08/02 0756) BP: (98-106)/(66-77) 104/68 (08/02 0756) SpO2:  [94 %-98 %] 97 % (08/02 0756) Weight:  [171 lb 11.2 oz (77.9 kg)] 171 lb 11.2 oz (77.9 kg) (08/02 0623) Last BM Date: 11/10/17  Weight change: Filed Weights   11/12/17 0540 11/13/17 0539 11/14/17 0623  Weight: 170 lb 4.8 oz (77.2 kg) 170 lb (77.1 kg) 171 lb 11.2 oz (77.9 kg)    Intake/Output:   Intake/Output Summary (Last 24 hours) at 11/14/2017 0827 Last data filed at 11/14/2017 0800 Gross per 24 hour  Intake 807.69 ml  Output 1175 ml  Net -367.31 ml      Physical Exam    CVP 8 General: Elderly appearing. No resp difficulty. HEENT: Normal anicteric Neck: Supple. JVP8, prominent CV waves. Carotids 2+ bilat; no bruits. No thyromegaly or nodule noted. Cor: PMI nondisplaced. IRR, 2/6 TR, +S3 Lungs: CTAB, normal effort. No wheeze Abdomen: Soft, non-tender, non-distended, no HSM. No bruits or masses. +BS  Extremities: no cyanosis, clubbing, rash, edema RUE  PICC Neuro: alert & oriented x 3, cranial nerves grossly intact. moves all 4 extremities w/o difficulty. Affect pleasant   Telemetry   Aflutter 80s. Personally reviewed.   EKG    No new tracings.   Labs    CBC Recent Labs    11/14/17 0500  WBC 10.9*  HGB 12.2*  HCT 38.8*  MCV 88.4  PLT 706   Basic Metabolic Panel Recent Labs    11/12/17 0610 11/13/17 0500 11/14/17 0500  NA 140 141 140  K 3.6 3.7 3.6  CL 93* 89* 93*  CO2 35* 38* 36*  GLUCOSE 138* 137* 95  BUN 40* 38* 37*  CREATININE 1.77* 1.93* 1.76*  CALCIUM 8.7* 9.2 8.7*  MG 1.9  --   --    Liver Function Tests No results for input(s): AST, ALT, ALKPHOS, BILITOT, PROT, ALBUMIN in the last 72 hours. No results for input(s): LIPASE, AMYLASE in the last 72 hours. Cardiac Enzymes No results for input(s): CKTOTAL, CKMB, CKMBINDEX, TROPONINI in the last 72 hours.  BNP: BNP (last 3 results) Recent Labs    06/25/17 1207 09/15/17 1401 11/05/17 1310  BNP 420.9* 393.0* 515.3*    ProBNP (last 3 results) No results for input(s): PROBNP in the last 8760 hours.   D-Dimer No results for input(s): DDIMER in the last 72 hours. Hemoglobin A1C No results for input(s): HGBA1C in the last 72 hours. Fasting Lipid Panel  No results for input(s): CHOL, HDL, LDLCALC, TRIG, CHOLHDL, LDLDIRECT in the last 72 hours. Thyroid Function Tests No results for input(s): TSH, T4TOTAL, T3FREE, THYROIDAB in the last 72 hours.  Invalid input(s): FREET3  Other results:   Imaging    No results found.   Medications:     Scheduled Medications: . amiodarone  400 mg Oral BID  . apixaban  5 mg Oral BID  . dronabinol  2.5 mg Oral BID AC  . famciclovir  250 mg Oral Daily  . gabapentin  800 mg Oral QHS  . insulin aspart  0-15 Units Subcutaneous TID WC  . insulin aspart  0-5 Units Subcutaneous QHS  . insulin aspart  5 Units Subcutaneous TID WC  . LORazepam  1 mg Oral Once  . magnesium oxide  400 mg Oral Daily  . Melatonin   6 mg Oral QHS  . pantoprazole  40 mg Oral Daily  . rosuvastatin  10 mg Oral Daily  . sodium chloride flush  10-40 mL Intracatheter Q12H  . sodium chloride flush  3 mL Intravenous Q12H  . spironolactone  25 mg Oral Daily  . traZODone  50 mg Oral QHS    Infusions: . sodium chloride 7 mL (11/14/17 0622)  . milrinone 0.125 mcg/kg/min (11/14/17 0617)    PRN Medications: sodium chloride, acetaminophen, LORazepam, ondansetron (ZOFRAN) IV, sodium chloride flush, sodium chloride flush, zolpidem    Patient Profile   Dillon Andrews is a 77 y.o. male with a hx of chronic combined CHF due to NICM, CKD III,presumedNASH(per pt, never proven),esophageal varices per chart(prior glass of wine occasionally but no heavy alcohol),HTN, HLD, DM, Barrett's esophagus, GERD, anemia, RBBB, TTR amyloidosis, and Kappa Light Chain myeloma.  Ad[facility] to [facility] 11/05/17 in setting of new Afib with RVR.   Assessment/Plan   1. Acute on chronic combined HF with TTR amyloidosis (Wild Type) - NICM by cath at [facility] 09/2016 with EF 40-45%. Echo 06/2017: EF 25-30%, mild LAE, mild/mod RV dilation, mod/severe RA. - PYP scan strongly suggestive of TTR amyloid, SPEP with 0.8% m-spike, UPEP with Bence Jones Protein positive, kappa type. 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 - Now also diagnosed with multiple myeloma so suspect this may be AL amyloid but wouldn't expect PYP to be positive. He may have both TTR and AL amyloid but this would be unusual  -Has been approved for Tafamidis, but his co-pay is $1000, which he cannot afford. Will defer for now in setting of treatment for MM.  - Dr Marin Olp saw this am and thinks he does not have AL amyloid and that he has TTR amyloid and should be treated with tafamodis before chemo. Will discuss with Dr [name].  - Volume status stable. CVP 8. Creatinine improved 1.93 > 1.76. Required 500 cc bolus yesterday. Hold off on further diuresis today. Can probably restart tomorrow.   - Co-ox 75%. Continue milrinone 0.125 mcg/kg/min for now.  - Carvedilol on hold with marginal co-ox. On PO amio for AFL  - Continue spiro 25 mg daily. SBP 100s - No ARB/entresto with CKD  2. New onset AFlutter  - likely triggered HF decompensation  - s/p TEE DCCV 7/29. Continue amio 400 mg BID. - On Eliquis 5 bid. Denies bleeding. - CHA2DS2/VASc is at least 6. (CHF, HTN, DM, CAD (mild, non-obstructive), and 2 for Age) - Back in atrial flutter 7/31. EP has said he is not an ablation candidate.  - Going for DCCV today  3. CKD III,  baseline ~1.6 -Creatinine slight bump 1.69 > 1.77 > 1.93 > 1.76. Torsemide held yesterday and given 500 cc bolus.   4. Recurrent bilateral effusions - Somewhat increased from March on CXR 11/05/17. Follow with diuresis. No change.   5. Kappa Lightchain Myeloma - Follows with Dr. Marin Olp who has seen this admission.  - Started on Bortezomib7/18/18andRevlimid7/23/19. - Continue management per primary team. No change.  - Dr Marin Olp thinks he does not have AL amyloid and that he does have TTR amyloid and should be treated with tafamodis before chemo.   6. NASH with esophageal varices.  - Watch closely for bleeding with AC. Denies bleeding.  - Last EGD 06/21/2014 at [facility] for surveillance of Esophageal varices, Barrett's esophagus, and PMHx of Colon polyps. Showed two columns of small esophageal varices w/out stigmata, short segment Barrett's esophagus (without biopsies, due to presence of EVs), 5cm hiatal hernia, and hyperplastic small gastric nodules. No change.   7. DM2 - Per primary. No change.   8. Hypokalemia. - K 3.6. Supp  9. Depression - Start sertraline 25 mg daily   Medication concerns reviewed with patient and pharmacy team. Barriers identified: None at this time.   Length of Stay: [place], NP  8:27 AM  Advanced Heart Failure Team Pager [phone] (M-F; 7a - 4p)  Please contact [facility] for night-coverage  after hours (4p -7a ) and weekends on [email or web address]  Patient seen and examined with the above-signed Advanced Practice Provider and/or Housestaff. I personally reviewed laboratory data, imaging studies and relevant notes. I independently examined the patient and formulated the important aspects of the plan. I have edited the note to reflect any of my changes or salient points. I have personally discussed the plan with the patient and/or family.  Remains tenuous. Dizziness and renal function improved with holding diuretics and giving some fluid back. Will hold torsemide again today. Restart tomorrow at 40/20. Remains in AFL. Will do repeat DC-CV today on po amio and see if he can hold. Will continue milrinone one more day and begin wean tomorrow. Appreciate Dr. Antonieta Pert input. Holding myeloma therapy will proceed with tafamadis. ([place] for family to bring from home). Would keep in house over the weeking. PT/OT to follow. Assess for home O2 needs.   Glori Bickers, MD  10:03 AM

## 2017-11-14 NOTE — CV Procedure (Signed)
      DIRECT CURRENT CARDIOVERSION  NAME:  Dillon Andrews   MRN: [id number] DOB:  1941-02-22   ADMIT DATE: 11/05/2017   INDICATIONS: Atrial flutter   PROCEDURE:   Informed consent was obtained prior to the procedure. The risks, benefits and alternatives for the procedure were discussed and the patient comprehended these risks. Once an appropriate time out was taken, the patient had the defibrillator pads placed in the anterior and posterior position. The patient then underwent sedation by the anesthesia service. Once an appropriate level of sedation was achieved, the patient received a single biphasic, synchronized 150J shock with prompt conversion to sinus rhythm. No apparent complications.  Glori Bickers, MD  10:34 AM

## 2017-11-14 NOTE — Progress Notes (Signed)
Mr. Crisafulli is doing okay.  Sounds like he might go home on Monday.  He is going for another cardioversion today.  He went earlier this week.  Looks like by his heart monitor, that he seems to be in sinus rhythm.  He did get approved for the Tafamdis for his transthyretin amyloid.  I think this is very important for him.  I would definitely get him started on this.  As far as his therapy that we are doing with Velcade/Revlimid, I would hold on this for right now.  I really do not believe that AL amyloid is causing cardiac issues.  We cannot find amyloid anywhere else.  Transthyretin cardiac amyloid really does not respond to chemotherapy.  If he does well with the Tafamdis, then we can see about getting the Velcade/Revlimid going again.  He really does not have a lot of light chain excretion.  In his urine, he has 107 mg/L of Kappa Lightchain which is not too bad.  His CBC today shows a white cell count of 10.9.  Hemoglobin 12.2.  Platelet count 188,000.  His creatinine is 1.93.  I suspect that his renal insufficiency is more so related to cardiac insufficiency.  Hopefully, he will be able to go home soon.  Again, I think that the Tafamdis is much more important for him right now than chemotherapy.  Again, he does not have a lot of light chain excretion.  His bone marrow did not even show any obvious myeloma nor amyloidosis.  I know that he is getting fantastic care by everybody up on 6 E.  Lattie Haw, MD  Lamentations 3:25-26

## 2017-11-14 NOTE — Interval H&P Note (Signed)
History and Physical Interval Note:  11/14/2017 10:04 AM  Dillon Andrews  has presented today for surgery, with the diagnosis of atrial flutter  The various methods of treatment have been discussed with the patient and family. After consideration of risks, benefits and other options for treatment, the patient has consented to  Procedure(s): CARDIOVERSION (N/A) as a surgical intervention .  The patient's history has been reviewed, patient examined, no change in status, stable for surgery.  I have reviewed the patient's chart and labs.  Questions were answered to the patient's satisfaction.     Dillon Andrews

## 2017-11-14 NOTE — Progress Notes (Addendum)
Advanced Heart Failure Rounding Note  PCP-Cardiologist: Glenetta Hew, MD   Subjective:    Received 500 cc bolus yesterday am with dizziness with increased creatinine. Torsemide held. Weight up 1 lb. Creatinine improving 1.93 > 1.76. CVP 8  Coox 75%. Remains on milrinone 0.125 mcg/kg/min.    Underwent successful TEE/DCCV 11/10/17.   Remains in Aflutter 80s on PO amio and eliquis. Going for repeat DCCV today.   Oncology saw this morning. Dr Marin Olp thinks he does not have AL amyloid and that he does have TTR amyloid and should be treated with tafamodis before chemo.   Says he feels a bit better today. Denies CP, SOB, or dizziness. No palpitations. Daughter says O2 sats dropped overnight to low 80s while he was sleeping.   Objective:   Weight Range: 171 lb 11.2 oz (77.9 kg) Body mass index is 25.36 kg/m.   Vital Signs:   Temp:  [97.4 F (36.3 C)-98.2 F (36.8 C)] 97.4 F (36.3 C) (08/02 0756) Pulse Rate:  [81-87] 81 (08/02 0756) Resp:  [17-27] 18 (08/02 0756) BP: (98-106)/(66-77) 104/68 (08/02 0756) SpO2:  [94 %-98 %] 97 % (08/02 0756) Weight:  [171 lb 11.2 oz (77.9 kg)] 171 lb 11.2 oz (77.9 kg) (08/02 0623) Last BM Date: 11/10/17  Weight change: Filed Weights   11/12/17 0540 11/13/17 0539 11/14/17 0623  Weight: 170 lb 4.8 oz (77.2 kg) 170 lb (77.1 kg) 171 lb 11.2 oz (77.9 kg)    Intake/Output:   Intake/Output Summary (Last 24 hours) at 11/14/2017 0827 Last data filed at 11/14/2017 0800 Gross per 24 hour  Intake 807.69 ml  Output 1175 ml  Net -367.31 ml      Physical Exam    CVP 8 General: Elderly appearing. No resp difficulty. HEENT: Normal anicteric Neck: Supple. JVP8, prominent CV waves. Carotids 2+ bilat; no bruits. No thyromegaly or nodule noted. Cor: PMI nondisplaced. IRR, 2/6 TR, +S3 Lungs: CTAB, normal effort. No wheeze Abdomen: Soft, non-tender, non-distended, no HSM. No bruits or masses. +BS  Extremities: no cyanosis, clubbing, rash, edema RUE  PICC Neuro: alert & oriented x 3, cranial nerves grossly intact. moves all 4 extremities w/o difficulty. Affect pleasant   Telemetry   Aflutter 80s. Personally reviewed.   EKG    No new tracings.   Labs    CBC Recent Labs    11/14/17 0500  WBC 10.9*  HGB 12.2*  HCT 38.8*  MCV 88.4  PLT 594   Basic Metabolic Panel Recent Labs    11/12/17 0610 11/13/17 0500 11/14/17 0500  NA 140 141 140  K 3.6 3.7 3.6  CL 93* 89* 93*  CO2 35* 38* 36*  GLUCOSE 138* 137* 95  BUN 40* 38* 37*  CREATININE 1.77* 1.93* 1.76*  CALCIUM 8.7* 9.2 8.7*  MG 1.9  --   --    Liver Function Tests No results for input(s): AST, ALT, ALKPHOS, BILITOT, PROT, ALBUMIN in the last 72 hours. No results for input(s): LIPASE, AMYLASE in the last 72 hours. Cardiac Enzymes No results for input(s): CKTOTAL, CKMB, CKMBINDEX, TROPONINI in the last 72 hours.  BNP: BNP (last 3 results) Recent Labs    06/25/17 1207 09/15/17 1401 11/05/17 1310  BNP 420.9* 393.0* 515.3*    ProBNP (last 3 results) No results for input(s): PROBNP in the last 8760 hours.   D-Dimer No results for input(s): DDIMER in the last 72 hours. Hemoglobin A1C No results for input(s): HGBA1C in the last 72 hours. Fasting Lipid Panel  No results for input(s): CHOL, HDL, LDLCALC, TRIG, CHOLHDL, LDLDIRECT in the last 72 hours. Thyroid Function Tests No results for input(s): TSH, T4TOTAL, T3FREE, THYROIDAB in the last 72 hours.  Invalid input(s): FREET3  Other results:   Imaging    No results found.   Medications:     Scheduled Medications: . amiodarone  400 mg Oral BID  . apixaban  5 mg Oral BID  . dronabinol  2.5 mg Oral BID AC  . famciclovir  250 mg Oral Daily  . gabapentin  800 mg Oral QHS  . insulin aspart  0-15 Units Subcutaneous TID WC  . insulin aspart  0-5 Units Subcutaneous QHS  . insulin aspart  5 Units Subcutaneous TID WC  . LORazepam  1 mg Oral Once  . magnesium oxide  400 mg Oral Daily  . Melatonin   6 mg Oral QHS  . pantoprazole  40 mg Oral Daily  . rosuvastatin  10 mg Oral Daily  . sodium chloride flush  10-40 mL Intracatheter Q12H  . sodium chloride flush  3 mL Intravenous Q12H  . spironolactone  25 mg Oral Daily  . traZODone  50 mg Oral QHS    Infusions: . sodium chloride 7 mL (11/14/17 0622)  . milrinone 0.125 mcg/kg/min (11/14/17 0617)    PRN Medications: sodium chloride, acetaminophen, LORazepam, ondansetron (ZOFRAN) IV, sodium chloride flush, sodium chloride flush, zolpidem    Patient Profile   Dillon Andrews is a 77 y.o. male with a hx of chronic combined CHF due to NICM, CKD III,presumedNASH(per pt, never proven),esophageal varices per chart(prior glass of wine occasionally but no heavy alcohol),HTN, HLD, DM, Barrett's esophagus, GERD, anemia, RBBB, TTR amyloidosis, and Kappa Light Chain myeloma.  Admitted to [facility] 11/05/17 in setting of new Afib with RVR.   Assessment/Plan   1. Acute on chronic combined HF with TTR amyloidosis (Wild Type) - NICM by cath at [facility] 09/2016 with EF 40-45%. Echo 06/2017: EF 25-30%, mild LAE, mild/mod RV dilation, mod/severe RA. - PYP scan strongly suggestive of TTR amyloid, SPEP with 0.8% m-spike, UPEP with Bence Jones Protein positive, kappa type. 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 - Now also diagnosed with multiple myeloma so suspect this may be AL amyloid but wouldn't expect PYP to be positive. He may have both TTR and AL amyloid but this would be unusual  -Has been approved for Tafamidis, but his co-pay is $1000, which he cannot afford. Will defer for now in setting of treatment for MM.  - Dr Marin Olp saw this am and thinks he does not have AL amyloid and that he has TTR amyloid and should be treated with tafamodis before chemo. Will discuss with Dr [name].  - Volume status stable. CVP 8. Creatinine improved 1.93 > 1.76. Required 500 cc bolus yesterday. Hold off on further diuresis today. Can probably restart tomorrow.   - Co-ox 75%. Continue milrinone 0.125 mcg/kg/min for now.  - Carvedilol on hold with marginal co-ox. On PO amio for AFL  - Continue spiro 25 mg daily. SBP 100s - No ARB/entresto with CKD  2. New onset AFlutter  - likely triggered HF decompensation  - s/p TEE DCCV 7/29. Continue amio 400 mg BID. - On Eliquis 5 bid. Denies bleeding. - CHA2DS2/VASc is at least 6. (CHF, HTN, DM, CAD (mild, non-obstructive), and 2 for Age) - Back in atrial flutter 7/31. EP has said he is not an ablation candidate.  - Going for DCCV today  3. CKD III,  baseline ~1.6 -Creatinine slight bump 1.69 > 1.77 > 1.93 > 1.76. Torsemide held yesterday and given 500 cc bolus.   4. Recurrent bilateral effusions - Somewhat increased from March on CXR 11/05/17. Follow with diuresis. No change.   5. Kappa Lightchain Myeloma - Follows with Dr. Marin Olp who has seen this admission.  - Started on Bortezomib7/18/18andRevlimid7/23/19. - Continue management per primary team. No change.  - Dr Marin Olp thinks he does not have AL amyloid and that he does have TTR amyloid and should be treated with tafamodis before chemo.   6. NASH with esophageal varices.  - Watch closely for bleeding with AC. Denies bleeding.  - Last EGD 06/21/2014 at [facility] for surveillance of Esophageal varices, Barrett's esophagus, and PMHx of Colon polyps. Showed two columns of small esophageal varices w/out stigmata, short segment Barrett's esophagus (without biopsies, due to presence of EVs), 5cm hiatal hernia, and hyperplastic small gastric nodules. No change.   7. DM2 - Per primary. No change.   8. Hypokalemia. - K 3.6. Supp  9. Depression - Start sertraline 25 mg daily   Medication concerns reviewed with patient and pharmacy team. Barriers identified: None at this time.   Length of Stay: [employer] [facility], NP  8:27 AM  Advanced Heart Failure Team Pager [phone] (M-F; 7a - 4p)  Please contact [facility] for night-coverage  after hours (4p -7a ) and weekends on [email or web address]  Patient seen and examined with the above-signed Advanced Practice Provider and/or Housestaff. I personally reviewed laboratory data, imaging studies and relevant notes. I independently examined the patient and formulated the important aspects of the plan. I have edited the note to reflect any of my changes or salient points. I have personally discussed the plan with the patient and/or family.  Remains tenuous. Dizziness and renal function improved with holding diuretics and giving some fluid back. Will hold torsemide again today. Restart tomorrow at 40/20. Remains in AFL. Will do repeat DC-CV today on po amio and see if he can hold. Will continue milrinone one more day and begin wean tomorrow. Appreciate Dr. Antonieta Pert input. Holding myeloma therapy will proceed with tafamadis. ([place] for family to bring from home). Would keep in house over the weeking. PT/OT to follow. Assess for home O2 needs.   Glori Bickers, MD  10:03 AM

## 2017-11-14 NOTE — Anesthesia Postprocedure Evaluation (Signed)
Anesthesia Post Note  Patient: Dillon Andrews  Procedure(s) Performed: CARDIOVERSION (N/A )     Patient location during evaluation: PACU Anesthesia Type: General Level of consciousness: awake and alert Pain management: pain level controlled Vital Signs Assessment: post-procedure vital signs reviewed and stable Respiratory status: spontaneous breathing, nonlabored ventilation and respiratory function stable Cardiovascular status: blood pressure returned to baseline and stable Postop Assessment: no apparent nausea or vomiting Anesthetic complications: no    Last Vitals:  Vitals:   11/14/17 1030 11/14/17 1036  BP: 107/67 98/66  Pulse: 74 74  Resp: 20 (!) 22  Temp:    SpO2: 100% 97%    Last Pain:  Vitals:   11/14/17 1036  TempSrc:   PainSc: 0-No pain                 Bunyan Brier,W. EDMOND

## 2017-11-14 NOTE — Transfer of Care (Signed)
Immediate Anesthesia Transfer of Care Note  Patient: Dillon Andrews  Procedure(s) Performed: CARDIOVERSION (N/A )  Patient Location: Endoscopy Unit  Anesthesia Type:General  Level of Consciousness: awake, alert  and oriented  Airway & Oxygen Therapy: Patient connected to nasal cannula oxygen  Post-op Assessment: Report given to RN  Post vital signs: stable  Last Vitals:  Vitals Value Taken Time  BP    Temp    Pulse    Resp    SpO2      Last Pain:  Vitals:   11/14/17 0920  TempSrc: Oral  PainSc: 0-No pain      Patients Stated Pain Goal: 0 (82/70/78 6754)  Complications: No apparent anesthesia complications

## 2017-11-14 NOTE — Anesthesia Preprocedure Evaluation (Addendum)
Anesthesia Evaluation  Patient identified by MRN, date of birth, ID band Patient awake    Reviewed: Allergy & Precautions, H&P , NPO status , Patient's Chart, lab work & pertinent test results, reviewed documented beta blocker date and time   Airway Mallampati: III  TM Distance: >3 FB Neck ROM: Full    Dental no notable dental hx. (+) Teeth Intact, Dental Advisory Given   Pulmonary neg pulmonary ROS, former smoker,    Pulmonary exam normal breath sounds clear to auscultation       Cardiovascular hypertension, Pt. on medications and Pt. on home beta blockers + Peripheral Vascular Disease and +CHF  + dysrhythmias Atrial Fibrillation  Rhythm:Regular Rate:Normal     Neuro/Psych negative neurological ROS  negative psych ROS   GI/Hepatic Neg liver ROS, GERD  ,  Endo/Other  diabetes  Renal/GU Renal disease  negative genitourinary   Musculoskeletal   Abdominal   Peds  Hematology negative hematology ROS (+) anemia ,   Anesthesia Other Findings   Reproductive/Obstetrics negative OB ROS                            Anesthesia Physical Anesthesia Plan  ASA: III  Anesthesia Plan: General   Post-op Pain Management:    Induction: Intravenous  PONV Risk Score and Plan: 2 and Treatment may vary due to age or medical condition  Airway Management Planned: Mask  Additional Equipment:   Intra-op Plan:   Post-operative Plan:   Informed Consent: I have reviewed the patients History and Physical, chart, labs and discussed the procedure including the risks, benefits and alternatives for the proposed anesthesia with the patient or authorized representative who has indicated his/her understanding and acceptance.   Dental advisory given  Plan Discussed with: CRNA  Anesthesia Plan Comments:         Anesthesia Quick Evaluation

## 2017-11-15 LAB — GLUCOSE, CAPILLARY
GLUCOSE-CAPILLARY: 142 mg/dL — AB (ref 70–99)
Glucose-Capillary: 144 mg/dL — ABNORMAL HIGH (ref 70–99)
Glucose-Capillary: 157 mg/dL — ABNORMAL HIGH (ref 70–99)
Glucose-Capillary: 147 mg/dL — ABNORMAL HIGH (ref 70–99)
Glucose-Capillary: 129 mg/dL — ABNORMAL HIGH (ref 70–99)

## 2017-11-15 LAB — BASIC METABOLIC PANEL
Anion gap: 10 (ref 5–15)
BUN: 33 mg/dL — ABNORMAL HIGH (ref 8–23)
CHLORIDE: 94 mmol/L — AB (ref 98–111)
CO2: 34 mmol/L — ABNORMAL HIGH (ref 22–32)
Calcium: 8.7 mg/dL — ABNORMAL LOW (ref 8.9–10.3)
Creatinine, Ser: 1.76 mg/dL — ABNORMAL HIGH (ref 0.61–1.24)
GFR calc non Af Amer: 35 mL/min — ABNORMAL LOW (ref >60)
GFR, EST AFRICAN AMERICAN: 41 mL/min — AB (ref >60)
Glucose, Bld: 135 mg/dL — ABNORMAL HIGH (ref 70–99)
Potassium: 3.8 mmol/L (ref 3.5–5.1)
Sodium: 138 mmol/L (ref 135–145)

## 2017-11-15 LAB — COOXEMETRY PANEL
Carboxyhemoglobin: 1.2 % (ref 0.5–1.5)
Methemoglobin: 1.5 % (ref 0.0–1.5)
O2 Saturation: 66.5 %
Total hemoglobin: 12.3 g/dL (ref 12.0–16.0)

## 2017-11-15 MED ORDER — POTASSIUM CHLORIDE CRYS ER 20 MEQ PO TBCR
20.0000 meq | EXTENDED_RELEASE_TABLET | Freq: Once | ORAL | Status: AC
  Administered 2017-11-15: 20 meq via ORAL
  Filled 2017-11-15: qty 1

## 2017-11-15 MED ORDER — BISACODYL 5 MG PO TBEC
10.0000 mg | DELAYED_RELEASE_TABLET | Freq: Every day | ORAL | Status: DC
  Administered 2017-11-15 – 2017-11-22 (×8): 10 mg via ORAL
  Filled 2017-11-15 (×11): qty 2

## 2017-11-15 NOTE — Progress Notes (Signed)
Patient ID: Dillon Andrews, male   DOB: [birth year], 77 y.o.   MRN: [id number]     Advanced Heart Failure Rounding Note  PCP-Cardiologist: Glenetta Hew, MD   Subjective:    Torsemide held yesterday. Creatinine stable at 1.76 with co-ox 66.5%, CVP 6 today.    He remains on milrinone 0.125.     Underwent successful TEE/DCCV 11/10/17. Atrial flutter recurred, had DCCV to NSR on 8/2.   He has started tafamidis.   SBP 110s, no complaints this morning.  No dyspnea.     Objective:   Weight Range: 172 lb 14.4 oz (78.4 kg) Body mass index is 25.53 kg/m.   Vital Signs:   Temp:  [97.8 F (36.6 C)-98 F (36.7 C)] 97.8 F (36.6 C) (08/03 0900) Pulse Rate:  [67-77] 71 (08/03 0900) Resp:  [13-33] 15 (08/03 0900) BP: (102-115)/(60-77) 102/67 (08/03 0426) SpO2:  [93 %-99 %] 97 % (08/03 0900) Weight:  [172 lb 14.4 oz (78.4 kg)] 172 lb 14.4 oz (78.4 kg) (08/03 0426) Last BM Date: 11/14/17  Weight change: Filed Weights   11/14/17 0623 11/14/17 0920 11/15/17 0426  Weight: 171 lb 11.2 oz (77.9 kg) 171 lb (77.6 kg) 172 lb 14.4 oz (78.4 kg)    Intake/Output:   Intake/Output Summary (Last 24 hours) at 11/15/2017 1057 Last data filed at 11/15/2017 0430 Gross per 24 hour  Intake 240 ml  Output 875 ml  Net -635 ml      Physical Exam    CVP 6 General: NAD Neck: JVP 8 cm, no thyromegaly or thyroid nodule.  Lungs: Clear to auscultation bilaterally with normal respiratory effort. CV: Nondisplaced PMI.  Heart regular S1/S2, no S3/S4, no murmur.  No peripheral edema.   Abdomen: Soft, nontender, no hepatosplenomegaly, no distention.  Skin: Intact without lesions or rashes.  Neurologic: Alert and oriented x 3.  Psych: Normal affect. Extremities: No clubbing or cyanosis.  HEENT: Normal.   Telemetry   NSR 70s with 5 beat NSVT run, personally reviewed.    EKG    No new tracings.   Labs    CBC Recent Labs    11/14/17 0500  WBC 10.9*  HGB 12.2*  HCT 38.8*  MCV 88.4  PLT 188     Basic Metabolic Panel Recent Labs    11/14/17 0500 11/15/17 0406  NA 140 138  K 3.6 3.8  CL 93* 94*  CO2 36* 34*  GLUCOSE 95 135*  BUN 37* 33*  CREATININE 1.76* 1.76*  CALCIUM 8.7* 8.7*   Liver Function Tests No results for input(s): AST, ALT, ALKPHOS, BILITOT, PROT, ALBUMIN in the last 72 hours. No results for input(s): LIPASE, AMYLASE in the last 72 hours. Cardiac Enzymes No results for input(s): CKTOTAL, CKMB, CKMBINDEX, TROPONINI in the last 72 hours.  BNP: BNP (last 3 results) Recent Labs    06/25/17 1207 09/15/17 1401 11/05/17 1310  BNP 420.9* 393.0* 515.3*    ProBNP (last 3 results) No results for input(s): PROBNP in the last 8760 hours.   D-Dimer No results for input(s): DDIMER in the last 72 hours. Hemoglobin A1C No results for input(s): HGBA1C in the last 72 hours. Fasting Lipid Panel No results for input(s): CHOL, HDL, LDLCALC, TRIG, CHOLHDL, LDLDIRECT in the last 72 hours. Thyroid Function Tests No results for input(s): TSH, T4TOTAL, T3FREE, THYROIDAB in the last 72 hours.  Invalid input(s): FREET3  Other results:   Imaging    No results found.   Medications:     Scheduled Medications: .  amiodarone  400 mg Oral BID  . apixaban  5 mg Oral BID  . dronabinol  2.5 mg Oral BID AC  . famciclovir  250 mg Oral Daily  . gabapentin  800 mg Oral QHS  . insulin aspart  0-15 Units Subcutaneous TID WC  . insulin aspart  0-5 Units Subcutaneous QHS  . insulin aspart  5 Units Subcutaneous TID WC  . LORazepam  1 mg Oral Once  . magnesium oxide  400 mg Oral Daily  . Melatonin  6 mg Oral QHS  . pantoprazole  40 mg Oral Daily  . rosuvastatin  10 mg Oral Daily  . sertraline  25 mg Oral Daily  . sodium chloride flush  10-40 mL Intracatheter Q12H  . sodium chloride flush  3 mL Intravenous Q12H  . spironolactone  25 mg Oral Daily  . Tafamidis Meglumine (Cardiac)  4 capsule Oral Daily  . torsemide  20 mg Oral Daily  . torsemide  40 mg Oral Daily  .  traZODone  50 mg Oral QHS    Infusions: . sodium chloride 7 mL (11/14/17 0622)  . milrinone 0.125 mcg/kg/min (11/15/17 0741)    PRN Medications: sodium chloride, acetaminophen, LORazepam, ondansetron (ZOFRAN) IV, sodium chloride flush, sodium chloride flush, zolpidem    Patient Profile   Dillon Andrews is a 77 y.o. male with a hx of chronic combined CHF due to NICM, CKD III,presumedNASH(per pt, never proven),esophageal varices per chart(prior glass of wine occasionally but no heavy alcohol),HTN, HLD, DM, Barrett's esophagus, GERD, anemia, RBBB, TTR amyloidosis, and Kappa Light Chain myeloma.  Admitted to [facility] 11/05/17 in setting of new Afib with RVR.   Assessment/Plan   1. Acute on chronic combined HF with TTR amyloidosis (Wild Type) - NICM by cath at [facility] 09/2016 with EF 40-45%. Echo 06/2017: EF 25-30%, mild LAE, mild/mod RV dilation, mod/severe RA. - PYP scan strongly suggestive of TTR amyloid, SPEP with 0.8% m-spike, UPEP with Bence Jones Protein positive, kappa type. 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 - Dr Marin Olp saw and thinks he does not have AL amyloid (just multiple myeloma) and that he has TTR amyloid and should be treated with tafamidis before chemo. Tafamidis started on 8/2.   - Volume status stable. CVP 6 with creatinine 1.76. He can restart his po torsemide today at 40 qam/20 qpm. - Co-ox 66.5%, stop milrinone today.   - Continue spiro 25 mg daily.  - No ARB/entresto with for now with CKD  2. New onset AFlutter  - likely triggered HF decompensation  - s/p TEE DCCV 7/29.   - Back in atrial flutter 7/31, repeat DCCV on 8/2.   - Continue amiodarone and Eliquis.   3. CKD III, baseline ~1.6 -Creatinine slight bump 1.69 > 1.77 > 1.93 > 1.76 > 1.76. Torsemide held yesterday again, will restart today with stable creatinine.   4. Recurrent bilateral effusions - Somewhat increased from March on CXR 11/05/17. Follow with diuresis. No change.   5. Kappa  Lightchain Myeloma - Follows with Dr. Marin Olp who has seen this admission.  - Started on Bortezomib7/18/18andRevlimid7/23/19. - Continue management per primary team. No change.  - Dr Marin Olp thinks he does not have AL amyloid and that he does have TTR amyloid and should be treated with tafamodis before chemo.   6. NASH with esophageal varices.  - Watch closely for bleeding with AC. Denies bleeding.  - Last EGD 06/21/2014 at [facility] for surveillance of Esophageal varices, Barrett's esophagus, and  PMHx of Colon polyps. Showed two columns of small esophageal varices w/out stigmata, short segment Barrett's esophagus (without biopsies, due to presence of EVs), 5cm hiatal hernia, and hyperplastic small gastric nodules. No change.   7. DM2 - Per primary. No change.   8. Hypokalemia. - K 3.6. Supp  9. Depression - Started sertraline 25 mg daily   Continue PT/OT, aim for home Monday.   Length of Stay: 10  Loralie Champagne, MD  10:57 AM  Advanced Heart Failure Team Pager [phone] (M-F; 7a - 4p)  Please contact [facility] for night-coverage after hours (4p -7a ) and weekends on [email or web address]

## 2017-11-15 NOTE — Progress Notes (Signed)
PROGRESS NOTE    HICKS FEICK  EHO:[id number] DOB: Aug 03, 1940 DOA: 11/05/2017 PCP: [name], MD  Brief Narrative25 year old male with past medical history of liver cirrhosis with esophageal varices, combined systolic and diastolic CHF with ejection fraction of 25 to 30%, nonischemic cardiomyopathy, pulmonary hypertension, cardiac amyloidosis, CKD stage III, kappa light chain myeloma on chemotherapy was brought to the emergency department due to shortness of breath. Patient was found to be in A. fib with RVR with hypotension on presentation. Cardiology was consulted. Started on amiodarone drip. Also started on heparin for new onset A. fib.   CHF team following, cardioversion on 7/29.    Assessment & Plan:   Principal Problem:   Atrial fibrillation (HCC) Active Problems:   CKD (chronic kidney disease) stage 3, GFR 30-59 ml/min (HCC), baseline Cr 1.5   DM2 (diabetes mellitus, type 2) (HCC)   Acute on chronic combined systolic and diastolic CHF (congestive heart failure) (HCC)   Bilateral pleural effusion   Esophageal varices in cirrhosis (HCC)   SOB (shortness of breath)   Non-ischemic cardiomyopathy ([place])   Other secondary pulmonary hypertension (HCC)   RBBB   Cardiac amyloidosis (HCC)   Kappa light chain myeloma (HCC)   Leukocytosis   Elevated troponin   Elevated brain natriuretic peptide (BNP) level, acute on chronic   Hypotension   Palliative care by specialist  Acute on chronic combined systolic and diastolic CHF, amyloidosis -2D echo 06/2017 showed EF of 25 to 30% with mild LAE, mild to moderate RV dilation moderate to severe RA. PYP scan suggestive of TTR amyloid. -Negative balance of 13 L, weight down from 187--> 171lbs  -CHF team following, IV Lasix discontinued, patient was placed on torsemide and continued on IV milrinone.   -Patient had TEE DCCV on 7/29 however reverted back to A. fib, plan for cardioversion 11/14/2017  Active problems Acute new  onset atrial fibrillation with RVR -Status post TEE DCCV on 7/29, briefly returned to normal sinus rhythm  - CHADS VASC 6, continue Eliquis -IV amiodarone transitioned to oral, per EP not an ablation candidate -S/P  cardioversion 8/2  Liver cirrhosis with esophageal varices -Currently no signs of bleeding, monitor closely on anticoagulation -Last EGD 06/21/2014 at [facility] showed esophageal varices, Barrett's esophagus, likely [name]. -Follow outpatient with gastroenterology  Kappa light chain myeloma -Follows Dr. Marin Olp, just had chemotherapy few days ago, was started on bortezomib on 10/20/2016 and Revlimid on 11/04/2017 -Dr. Marin Olp following  Mild acute on CKD stage III -Baseline around 1.6 -Creatinine improving, 1.7 today   Diabetes mellitus type 2 -CBGs improving, continue sliding scale insulin, NovoLog meal coverage       DVT prophylaxis: Apixaban code Status full code  family Communication no family in the room Disposition Plan: Once cleared by cardiology  Consultants: CHF team   Procedures: TEE and cardioversion 11/10/2017 and repeat cardioversion 11/14/2017 Antimicrobials: None  Subjective:   Objective: Vitals:   11/15/17 0500 11/15/17 0600 11/15/17 0700 11/15/17 0900  BP:      Pulse:    71  Resp: 15 16 13 15   Temp:    97.8 F (36.6 C)  TempSrc:    Oral  SpO2: 98% 97% 98% 97%  Weight:      Height:        Intake/Output Summary (Last 24 hours) at 11/15/2017 1105 Last data filed at 11/15/2017 0430 Gross per 24 hour  Intake 240 ml  Output 875 ml  Net -635 ml   [employer]  11/14/17 0623 11/14/17 0920 11/15/17 0426  Weight: 77.9 kg (171 lb 11.2 oz) 77.6 kg (171 lb) 78.4 kg (172 lb 14.4 oz)    Examination: Patient awake alert sitting up by the side of the bed eating breakfast had 5 beats of nonsustained V. tach which was asymptomatic he denies any nausea vomiting chest pain shortness of breath.  General exam: Appears calm and comfortable    Respiratory system: Clear to auscultation. Respiratory effort normal. Cardiovascular system: S1 & S2 heard, RRR. No JVD, murmurs, rubs, gallops or clicks. No pedal edema. Gastrointestinal system: Abdomen is nondistended, soft and nontender. No organomegaly or masses felt. Normal bowel sounds heard. Central nervous system: Alert and oriented. No focal neurological deficits. Extremities: Symmetric 5 x 5 power. Skin: No rashes, lesions or ulcers Psychiatry: Judgement and insight appear normal. Mood & affect appropriate.     Data Reviewed: I have personally reviewed following labs and imaging studies  CBC: Recent Labs  Lab 11/11/17 0546 11/14/17 0500  WBC 11.6* 10.9*  HGB 12.3* 12.2*  HCT 39.4 38.8*  MCV 89.3 88.4  PLT 170 373   Basic Metabolic Panel: Recent Labs  Lab 11/11/17 0546 11/12/17 0610 11/13/17 0500 11/14/17 0500 11/15/17 0406  NA 139 140 141 140 138  K 3.8 3.6 3.7 3.6 3.8  CL 92* 93* 89* 93* 94*  CO2 36* 35* 38* 36* 34*  GLUCOSE 118* 138* 137* 95 135*  BUN 40* 40* 38* 37* 33*  CREATININE 1.69* 1.77* 1.93* 1.76* 1.76*  CALCIUM 8.8* 8.7* 9.2 8.7* 8.7*  MG 2.1 1.9  --   --   --    GFR: Estimated Creatinine Clearance: 34.6 mL/min (A) (by C-G formula based on SCr of 1.76 mg/dL (H)). Liver Function Tests: No results for input(s): AST, ALT, ALKPHOS, BILITOT, PROT, ALBUMIN in the last 168 hours. No results for input(s): LIPASE, AMYLASE in the last 168 hours. No results for input(s): AMMONIA in the last 168 hours. Coagulation Profile: No results for input(s): INR, PROTIME in the last 168 hours. Cardiac Enzymes: No results for input(s): CKTOTAL, CKMB, CKMBINDEX, TROPONINI in the last 168 hours. BNP (last 3 results) No results for input(s): PROBNP in the last 8760 hours. HbA1C: No results for input(s): HGBA1C in the last 72 hours. CBG: Recent Labs  Lab 11/14/17 0755 11/14/17 1112 11/14/17 1643 11/14/17 2138 11/15/17 0723  GLUCAP 111* 119* 252* 132* 144*    Lipid Profile: No results for input(s): CHOL, HDL, LDLCALC, TRIG, CHOLHDL, LDLDIRECT in the last 72 hours. Thyroid Function Tests: No results for input(s): TSH, T4TOTAL, FREET4, T3FREE, THYROIDAB in the last 72 hours. Anemia Panel: No results for input(s): VITAMINB12, FOLATE, FERRITIN, TIBC, IRON, RETICCTPCT in the last 72 hours. Sepsis Labs: No results for input(s): PROCALCITON, LATICACIDVEN in the last 168 hours.  Recent Results (from the past 240 hour(s))  Culture, Urine     Status: Abnormal   Collection Time: 11/05/17  4:24 PM  Result Value Ref Range Status   Specimen Description URINE, CLEAN CATCH  Final   Special Requests NONE  Final   Culture (A)  Final    <10,000 COLONIES/mL INSIGNIFICANT GROWTH Performed at [facility], [address], [place], [place] [address]    Report Status 11/06/2017 FINAL  Final  MRSA PCR Screening     Status: None   Collection Time: 11/05/17  9:52 PM  Result Value Ref Range Status   MRSA by PCR NEGATIVE NEGATIVE Final    Comment:  The GeneXpert MRSA Assay (FDA approved for NASAL specimens only), is one component of a comprehensive MRSA colonization surveillance program. It is not intended to diagnose MRSA infection nor to guide or monitor treatment for MRSA infections. Performed at [facility], [place] [address], [place], [place] [address]          Radiology Studies: No results found.      Scheduled Meds: . amiodarone  400 mg Oral BID  . apixaban  5 mg Oral BID  . dronabinol  2.5 mg Oral BID AC  . famciclovir  250 mg Oral Daily  . gabapentin  800 mg Oral QHS  . insulin aspart  0-15 Units Subcutaneous TID WC  . insulin aspart  0-5 Units Subcutaneous QHS  . insulin aspart  5 Units Subcutaneous TID WC  . LORazepam  1 mg Oral Once  . magnesium oxide  400 mg Oral Daily  . Melatonin  6 mg Oral QHS  . pantoprazole  40 mg Oral Daily  . potassium chloride  20 mEq Oral Once  . rosuvastatin  10 mg Oral Daily   . sertraline  25 mg Oral Daily  . sodium chloride flush  10-40 mL Intracatheter Q12H  . sodium chloride flush  3 mL Intravenous Q12H  . spironolactone  25 mg Oral Daily  . Tafamidis Meglumine (Cardiac)  4 capsule Oral Daily  . torsemide  20 mg Oral Daily  . torsemide  40 mg Oral Daily  . traZODone  50 mg Oral QHS   Continuous Infusions: . sodium chloride 7 mL (11/14/17 0622)     LOS: 10 days      Georgette Shell, MD [facility]  If 7PM-7AM, please contact night-coverage [email or web address] Password [email or web address] 11/15/2017, 11:05 AM

## 2017-11-16 ENCOUNTER — Encounter (HOSPITAL_COMMUNITY): Payer: Self-pay | Admitting: Internal Medicine

## 2017-11-16 LAB — GLUCOSE, CAPILLARY
GLUCOSE-CAPILLARY: 103 mg/dL — AB (ref 70–99)
GLUCOSE-CAPILLARY: 217 mg/dL — AB (ref 70–99)
Glucose-Capillary: 136 mg/dL — ABNORMAL HIGH (ref 70–99)
Glucose-Capillary: 185 mg/dL — ABNORMAL HIGH (ref 70–99)

## 2017-11-16 LAB — MAGNESIUM: MAGNESIUM: 2 mg/dL (ref 1.7–2.4)

## 2017-11-16 LAB — BASIC METABOLIC PANEL
Anion gap: 11 (ref 5–15)
BUN: 33 mg/dL — AB (ref 8–23)
CHLORIDE: 92 mmol/L — AB (ref 98–111)
CO2: 35 mmol/L — ABNORMAL HIGH (ref 22–32)
Calcium: 9 mg/dL (ref 8.9–10.3)
Creatinine, Ser: 1.78 mg/dL — ABNORMAL HIGH (ref 0.61–1.24)
GFR calc non Af Amer: 35 mL/min — ABNORMAL LOW (ref >60)
GFR, EST AFRICAN AMERICAN: 40 mL/min — AB (ref >60)
GLUCOSE: 122 mg/dL — AB (ref 70–99)
POTASSIUM: 3.6 mmol/L (ref 3.5–5.1)
SODIUM: 138 mmol/L (ref 135–145)

## 2017-11-16 LAB — COOXEMETRY PANEL
CARBOXYHEMOGLOBIN: 1.3 % (ref 0.5–1.5)
METHEMOGLOBIN: 1.4 % (ref 0.0–1.5)
O2 SAT: 61 %
TOTAL HEMOGLOBIN: 12 g/dL (ref 12.0–16.0)

## 2017-11-16 MED ORDER — HYDROCORTISONE 2.5 % RE CREA
TOPICAL_CREAM | Freq: Three times a day (TID) | RECTAL | Status: DC
  Administered 2017-11-16 – 2017-11-17 (×4): via RECTAL
  Administered 2017-11-21 – 2017-11-22 (×3): 1 via RECTAL
  Administered 2017-11-22 – 2017-11-24 (×5): via RECTAL
  Administered 2017-11-24: 1 via RECTAL
  Administered 2017-11-25: 21:00:00 via RECTAL
  Administered 2017-11-25: 1 via RECTAL
  Filled 2017-11-16 (×2): qty 28.35

## 2017-11-16 MED ORDER — ENSURE ENLIVE PO LIQD
237.0000 mL | Freq: Two times a day (BID) | ORAL | Status: DC
  Administered 2017-11-16 – 2017-11-27 (×17): 237 mL via ORAL

## 2017-11-16 MED ORDER — POTASSIUM CHLORIDE CRYS ER 20 MEQ PO TBCR
20.0000 meq | EXTENDED_RELEASE_TABLET | Freq: Once | ORAL | Status: AC
  Administered 2017-11-16: 20 meq via ORAL
  Filled 2017-11-16: qty 1

## 2017-11-16 NOTE — Progress Notes (Signed)
Patient ID: Dillon Andrews, male   DOB: 1940/10/28, 77 y.o.   MRN: [id number]     Advanced Heart Failure Rounding Note  PCP-Cardiologist: Glenetta Hew, MD   Subjective:    Back on torsemide. Creatinine stable at 1.78 with co-ox 61% off milrinone, CVP 8-9 today.    Underwent successful TEE/DCCV 11/10/17. Atrial flutter recurred, had DCCV to NSR on 8/2.   He has started tafamidis.   SBP 100s-110s.  No dyspnea.  Yesterday had abdominal discomfort, improved after bowel movement.  No abdominal discomfort today but appetite poor.     Objective:   Weight Range: 170 lb 14.4 oz (77.5 kg) Body mass index is 25.24 kg/m.   Vital Signs:   Temp:  [97.4 F (36.3 C)-98.2 F (36.8 C)] 98 F (36.7 C) (08/04 0759) Pulse Rate:  [63-69] 65 (08/04 0759) Resp:  [14-27] 18 (08/04 0759) BP: (98-117)/(63-76) 105/72 (08/04 0759) SpO2:  [94 %-99 %] 99 % (08/04 0759) Weight:  [170 lb 14.4 oz (77.5 kg)] 170 lb 14.4 oz (77.5 kg) (08/04 0434) Last BM Date: 11/15/17  Weight change: Filed Weights   11/14/17 0920 11/15/17 0426 11/16/17 0434  Weight: 171 lb (77.6 kg) 172 lb 14.4 oz (78.4 kg) 170 lb 14.4 oz (77.5 kg)    Intake/Output:   Intake/Output Summary (Last 24 hours) at 11/16/2017 1032 Last data filed at 11/16/2017 0959 Gross per 24 hour  Intake 440.55 ml  Output 1725 ml  Net -1284.45 ml      Physical Exam    CVP 8-9 General: NAD Neck: JVP 8 cm, no thyromegaly or thyroid nodule.  Lungs: Clear to auscultation bilaterally with normal respiratory effort. CV: Nondisplaced PMI.  Heart regular S1/S2, no S3/S4, no murmur.  No peripheral edema.  No carotid bruit.  Normal pedal pulses.  Abdomen: Soft, nontender, no hepatosplenomegaly, no distention.  Skin: Intact without lesions or rashes.  Neurologic: Alert and oriented x 3.  Psych: Normal affect. Extremities: No clubbing or cyanosis.  HEENT: Normal.    Telemetry   NSR 70s, personally reviewed.    EKG    No new tracings.   Labs      CBC Recent Labs    11/14/17 0500  WBC 10.9*  HGB 12.2*  HCT 38.8*  MCV 88.4  PLT 662   Basic Metabolic Panel Recent Labs    11/15/17 0406 11/16/17 0421  NA 138 138  K 3.8 3.6  CL 94* 92*  CO2 34* 35*  GLUCOSE 135* 122*  BUN 33* 33*  CREATININE 1.76* 1.78*  CALCIUM 8.7* 9.0  MG  --  2.0   Liver Function Tests No results for input(s): AST, ALT, ALKPHOS, BILITOT, PROT, ALBUMIN in the last 72 hours. No results for input(s): LIPASE, AMYLASE in the last 72 hours. Cardiac Enzymes No results for input(s): CKTOTAL, CKMB, CKMBINDEX, TROPONINI in the last 72 hours.  BNP: BNP (last 3 results) Recent Labs    06/25/17 1207 09/15/17 1401 11/05/17 1310  BNP 420.9* 393.0* 515.3*    ProBNP (last 3 results) No results for input(s): PROBNP in the last 8760 hours.   D-Dimer No results for input(s): DDIMER in the last 72 hours. Hemoglobin A1C No results for input(s): HGBA1C in the last 72 hours. Fasting Lipid Panel No results for input(s): CHOL, HDL, LDLCALC, TRIG, CHOLHDL, LDLDIRECT in the last 72 hours. Thyroid Function Tests No results for input(s): TSH, T4TOTAL, T3FREE, THYROIDAB in the last 72 hours.  Invalid input(s): FREET3  Other results:   Imaging  No results found.   Medications:     Scheduled Medications: . amiodarone  400 mg Oral BID  . apixaban  5 mg Oral BID  . bisacodyl  10 mg Oral Daily  . dronabinol  2.5 mg Oral BID AC  . famciclovir  250 mg Oral Daily  . gabapentin  800 mg Oral QHS  . insulin aspart  0-15 Units Subcutaneous TID WC  . insulin aspart  0-5 Units Subcutaneous QHS  . insulin aspart  5 Units Subcutaneous TID WC  . LORazepam  1 mg Oral Once  . magnesium oxide  400 mg Oral Daily  . Melatonin  6 mg Oral QHS  . pantoprazole  40 mg Oral Daily  . rosuvastatin  10 mg Oral Daily  . sertraline  25 mg Oral Daily  . sodium chloride flush  10-40 mL Intracatheter Q12H  . sodium chloride flush  3 mL Intravenous Q12H  . spironolactone   25 mg Oral Daily  . Tafamidis Meglumine (Cardiac)  4 capsule Oral Daily  . torsemide  20 mg Oral Daily  . torsemide  40 mg Oral Daily  . traZODone  50 mg Oral QHS    Infusions: . sodium chloride 7 mL/hr at 11/14/17 5170    PRN Medications: sodium chloride, acetaminophen, LORazepam, ondansetron (ZOFRAN) IV, sodium chloride flush, sodium chloride flush, zolpidem    Patient Profile   Dillon Andrews is a 77 y.o. male with a hx of chronic combined CHF due to NICM, CKD III,presumedNASH(per pt, never proven),esophageal varices per chart(prior glass of wine occasionally but no heavy alcohol),HTN, HLD, DM, Barrett's esophagus, GERD, anemia, RBBB, TTR amyloidosis, and Kappa Light Chain myeloma.  Admitted to [facility] 11/05/17 in setting of new Afib with RVR.   Assessment/Plan   1. Acute on chronic combined HF with TTR amyloidosis ([place]) - NICM by cath at [facility] 09/2016 with EF 40-45%. Echo 06/2017: EF 25-30%, mild LAE, mild/mod RV dilation, mod/severe RA. - PYP scan strongly suggestive of TTR amyloid, SPEP with 0.8% m-spike, UPEP with Bence Jones Protein positive, kappa type. 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 - Dr Marin Olp saw and thinks he does not have AL amyloid (just multiple myeloma) and that he has TTR amyloid and should be treated with tafamidis before chemo. Tafamidis started on 8/2.   - CVP 8-9 today.  Good UOP yesterday, continue torsemide at 40 qam/20 qpm. - Co-ox 61% off milrinone.   - Continue spiro 25 mg daily.  - No ARB/entresto with for now with CKD  2. New onset AFlutter  - likely triggered HF decompensation  - s/p TEE DCCV 7/29.   - Back in atrial flutter 7/31, repeat DCCV on 8/2.   - Continue amiodarone and Eliquis.   3. CKD III, baseline ~1.6 -Creatinine slight bump 1.69 > 1.77 > 1.93 > 1.76 > 1.76 > 1.78. Torsemide restarted yesterday.   4. Recurrent bilateral effusions - Somewhat increased from March on CXR 11/05/17. Follow with diuresis. No  change.   5. Kappa Lightchain Myeloma - Follows with Dr. Marin Olp who has seen this admission.  - Started on Bortezomib7/18/18andRevlimid7/23/19. - Continue management per primary team. No change.  - Dr Marin Olp thinks he does not have AL amyloid and that he does have TTR amyloid and should be treated with tafamodis before chemo.   6. NASH with esophageal varices.  - Watch closely for bleeding with AC. Denies bleeding.  - Last EGD 06/21/2014 at [facility] for surveillance of Esophageal varices,  Barrett's esophagus, and PMHx of Colon polyps. Showed two columns of small esophageal varices w/out stigmata, short segment Barrett's esophagus (without biopsies, due to presence of EVs), 5cm hiatal hernia, and hyperplastic small gastric nodules. No change.   7. DM2 - Per primary. No change.   8. Hypokalemia. - K 3.6. Supp  9. Depression - Started sertraline 25 mg daily   Continue PT/OT, aim for home Monday.  Needs rollator to walk.  Will add Ensure with poor appetite.   Length of Stay: 74  Loralie Champagne, MD  10:32 AM  Advanced Heart Failure Team Pager [phone] (M-F; 7a - 4p)  Please contact [facility] for night-coverage after hours (4p -7a ) and weekends on [email or web address]

## 2017-11-16 NOTE — Progress Notes (Addendum)
PROGRESS NOTE    Dillon Andrews  VZD:[id number] DOB: Nov 12, 1940 DOA: 11/05/2017 PCP: [name], MD   Brief Narrative:77 year old male with past medical history of liver cirrhosis with esophageal varices, combined systolic and diastolic CHF with ejection fraction of 25 to 30%, nonischemic cardiomyopathy, pulmonary hypertension, cardiac amyloidosis, CKD stage III, kappa light chain myeloma on chemotherapy was brought to the emergency department due to shortness of breath. Patient was found to be in A. fib with RVR with hypotension on presentation. Cardiology was consulted. Started on amiodarone drip. Also started on heparin for new onset A. fib. CHF team following, cardioversion on 7/29.  11/16/2017-patient and family requesting oxygen at night time at home.  Patient was not on oxygen at home prior to admission.   Assessment & Plan:   Principal Problem:   Atrial fibrillation with RVR (HCC) Active Problems:   CKD (chronic kidney disease) stage 3, GFR 30-59 ml/min (HCC), baseline Cr 1.5   DM2 (diabetes mellitus, type 2) (HCC)   Acute on chronic combined systolic and diastolic CHF (congestive heart failure) (HCC)   Bilateral pleural effusion   Esophageal varices in cirrhosis (HCC)   SOB (shortness of breath)   Non-ischemic cardiomyopathy ([place])   Other secondary pulmonary hypertension (HCC)   RBBB   Cardiac amyloidosis (HCC)   Kappa light chain myeloma (HCC)   Leukocytosis   Elevated troponin   Elevated brain natriuretic peptide (BNP) level, acute on chronic   Hypotension   Palliative care by specialist  Acute on chronic combined systolic and diastolic CHF, amyloidosis -2D echo 06/2017 showed EF of 25 to 30% with mild LAE, mild to moderate RV dilation moderate to severe RA. PYP scan suggestive of TTR amyloid. -Negative balance of13 L, weight down from 187--> 171lbs -CHF team following, IV Lasix discontinued, patient was placed on torsemide and continued on IV  milrinone. -Patient had TEE DCCV on 7/29 however reverted back to A. fib, plan for cardioversion 11/14/2017  Active problems Acute new onset atrial fibrillation with RVR -Status post TEE DCCV on 7/29, briefly returned to normal sinus rhythm  - CHADS VASC 6, continue Eliquis -IV amiodarone transitioned to oral, per EP not an ablation candidate -S/P  cardioversion 8/2  Liver cirrhosis with esophageal varices -Currently no signs of bleeding, monitor closely on anticoagulation -Last EGD 06/21/2014 at [facility] showed esophageal varices, Barrett's esophagus, likely [name]. -Follow outpatient with gastroenterology  Kappa light chain myeloma -Follows Dr. Marin Olp, just had chemotherapy few days ago, was started on bortezomib on 10/20/2016 and Revlimid on 11/04/2017 -Dr. Marin Olp following  Mild acute on CKD stage III -Baseline around 1.6 -Creatinine improving, 1.7 today  Diabetes mellitus type 2-CBGs stable patient did not take anything at home for diabetes.      DVT prophylaxis: Apixaban  Code Status: Full code Family Communication: Daughter in the room Disposition Plan: Plan discharge tomorrow Consultants: CHF team   Procedures: TEE and cardioversion 11/10/2017 patient had repeat cardioversion 11/14/2017  Antimicrobials: None  Subjective: Resting in bed denies any chest pain palpitation shortness of breath nausea vomiting.  Had bowel movement yesterday.  Denies constipation.  Objective: Vitals:   11/16/17 0425 11/16/17 0434 11/16/17 0436 11/16/17 0759  BP:   98/65 105/72  Pulse:   64 65  Resp: 18  19 18   Temp:   98 F (36.7 C) 98 F (36.7 C)  TempSrc:   Oral Oral  SpO2: 96%  96% 99%  Weight:  77.5 kg (170 lb 14.4 oz)  Height:        Intake/Output Summary (Last 24 hours) at 11/16/2017 1126 Last data filed at 11/16/2017 0959 Gross per 24 hour  Intake 440.55 ml  Output 1525 ml  Net -1084.45 ml   Filed Weights   11/14/17 0920 11/15/17 0426 11/16/17 0434  Weight: 77.6  kg (171 lb) 78.4 kg (172 lb 14.4 oz) 77.5 kg (170 lb 14.4 oz)    Examination:  General exam: Appears calm and comfortable  Respiratory system: Clear to auscultation. Respiratory effort normal. Cardiovascular system: S1 & S2 heard, RRR. No JVD, murmurs, rubs, gallops or clicks. No pedal edema. Gastrointestinal system: Abdomen is nondistended, soft and nontender. No organomegaly or masses felt. Normal bowel sounds heard. Central nervous system: Alert and oriented. No focal neurological deficits. Extremities: Symmetric 5 x 5 power. Skin: No rashes, lesions or ulcers Psychiatry: Judgement and insight appear normal. Mood & affect appropriate.     Data Reviewed: I have personally reviewed following labs and imaging studies  CBC: Recent Labs  Lab 11/11/17 0546 11/14/17 0500  WBC 11.6* 10.9*  HGB 12.3* 12.2*  HCT 39.4 38.8*  MCV 89.3 88.4  PLT 170 765   Basic Metabolic Panel: Recent Labs  Lab 11/11/17 0546 11/12/17 0610 11/13/17 0500 11/14/17 0500 11/15/17 0406 11/16/17 0421  NA 139 140 141 140 138 138  K 3.8 3.6 3.7 3.6 3.8 3.6  CL 92* 93* 89* 93* 94* 92*  CO2 36* 35* 38* 36* 34* 35*  GLUCOSE 118* 138* 137* 95 135* 122*  BUN 40* 40* 38* 37* 33* 33*  CREATININE 1.69* 1.77* 1.93* 1.76* 1.76* 1.78*  CALCIUM 8.8* 8.7* 9.2 8.7* 8.7* 9.0  MG 2.1 1.9  --   --   --  2.0   GFR: Estimated Creatinine Clearance: 34.2 mL/min (A) (by C-G formula based on SCr of 1.78 mg/dL (H)). Liver Function Tests: No results for input(s): AST, ALT, ALKPHOS, BILITOT, PROT, ALBUMIN in the last 168 hours. No results for input(s): LIPASE, AMYLASE in the last 168 hours. No results for input(s): AMMONIA in the last 168 hours. Coagulation Profile: No results for input(s): INR, PROTIME in the last 168 hours. Cardiac Enzymes: No results for input(s): CKTOTAL, CKMB, CKMBINDEX, TROPONINI in the last 168 hours. BNP (last 3 results) No results for input(s): PROBNP in the last 8760 hours. HbA1C: No  results for input(s): HGBA1C in the last 72 hours. CBG: Recent Labs  Lab 11/15/17 1113 11/15/17 1516 11/15/17 1747 11/15/17 2117 11/16/17 0757  GLUCAP 142* 157* 147* 129* 136*   Lipid Profile: No results for input(s): CHOL, HDL, LDLCALC, TRIG, CHOLHDL, LDLDIRECT in the last 72 hours. Thyroid Function Tests: No results for input(s): TSH, T4TOTAL, FREET4, T3FREE, THYROIDAB in the last 72 hours. Anemia Panel: No results for input(s): VITAMINB12, FOLATE, FERRITIN, TIBC, IRON, RETICCTPCT in the last 72 hours. Sepsis Labs: No results for input(s): PROCALCITON, LATICACIDVEN in the last 168 hours.  No results found for this or any previous visit (from the past 240 hour(s)).       Radiology Studies: No results found.      Scheduled Meds: . amiodarone  400 mg Oral BID  . apixaban  5 mg Oral BID  . bisacodyl  10 mg Oral Daily  . dronabinol  2.5 mg Oral BID AC  . famciclovir  250 mg Oral Daily  . feeding supplement (ENSURE ENLIVE)  237 mL Oral BID BM  . gabapentin  800 mg Oral QHS  . insulin aspart  0-15 Units Subcutaneous  TID WC  . insulin aspart  0-5 Units Subcutaneous QHS  . insulin aspart  5 Units Subcutaneous TID WC  . LORazepam  1 mg Oral Once  . magnesium oxide  400 mg Oral Daily  . Melatonin  6 mg Oral QHS  . pantoprazole  40 mg Oral Daily  . rosuvastatin  10 mg Oral Daily  . sertraline  25 mg Oral Daily  . sodium chloride flush  10-40 mL Intracatheter Q12H  . sodium chloride flush  3 mL Intravenous Q12H  . spironolactone  25 mg Oral Daily  . Tafamidis Meglumine (Cardiac)  4 capsule Oral Daily  . torsemide  20 mg Oral Daily  . torsemide  40 mg Oral Daily  . traZODone  50 mg Oral QHS   Continuous Infusions: . sodium chloride 7 mL/hr at 11/14/17 5848     LOS: 11 days     Georgette Shell, MD [facility]  If 7PM-7AM, please contact night-coverage [email or web address] Password [email or web address] 11/16/2017, 11:26 AM

## 2017-11-17 ENCOUNTER — Inpatient Hospital Stay (HOSPITAL_COMMUNITY): Payer: Medicare HMO

## 2017-11-17 ENCOUNTER — Encounter (HOSPITAL_COMMUNITY): Payer: Medicare HMO

## 2017-11-17 DIAGNOSIS — A419 Sepsis, unspecified organism: Secondary | ICD-10-CM

## 2017-11-17 LAB — GLUCOSE, CAPILLARY
GLUCOSE-CAPILLARY: 167 mg/dL — AB (ref 70–99)
GLUCOSE-CAPILLARY: 183 mg/dL — AB (ref 70–99)
Glucose-Capillary: 270 mg/dL — ABNORMAL HIGH (ref 70–99)
Glucose-Capillary: 51 mg/dL — ABNORMAL LOW (ref 70–99)
Glucose-Capillary: 84 mg/dL (ref 70–99)
Glucose-Capillary: 125 mg/dL — ABNORMAL HIGH (ref 70–99)

## 2017-11-17 LAB — BASIC METABOLIC PANEL
Anion gap: 12 (ref 5–15)
BUN: 36 mg/dL — ABNORMAL HIGH (ref 8–23)
CALCIUM: 9 mg/dL (ref 8.9–10.3)
CHLORIDE: 93 mmol/L — AB (ref 98–111)
CO2: 34 mmol/L — AB (ref 22–32)
Creatinine, Ser: 1.79 mg/dL — ABNORMAL HIGH (ref 0.61–1.24)
GFR calc Af Amer: 40 mL/min — ABNORMAL LOW (ref >60)
GFR calc non Af Amer: 35 mL/min — ABNORMAL LOW (ref >60)
GLUCOSE: 135 mg/dL — AB (ref 70–99)
Potassium: 3.8 mmol/L (ref 3.5–5.1)
Sodium: 139 mmol/L (ref 135–145)

## 2017-11-17 LAB — CBC
HEMATOCRIT: 38.1 % — AB (ref 39.0–52.0)
HEMOGLOBIN: 11.8 g/dL — AB (ref 13.0–17.0)
MCH: 27.7 pg (ref 26.0–34.0)
MCHC: 31 g/dL (ref 30.0–36.0)
MCV: 89.4 fL (ref 78.0–100.0)
Platelets: 153 10*3/uL (ref 150–400)
RBC: 4.26 MIL/uL (ref 4.22–5.81)
RDW: 17.9 % — ABNORMAL HIGH (ref 11.5–15.5)
WBC: 13.8 10*3/uL — ABNORMAL HIGH (ref 4.0–10.5)

## 2017-11-17 LAB — URINALYSIS, ROUTINE W REFLEX MICROSCOPIC
Bilirubin Urine: NEGATIVE
GLUCOSE, UA: NEGATIVE mg/dL
HGB URINE DIPSTICK: NEGATIVE
KETONES UR: NEGATIVE mg/dL
LEUKOCYTES UA: NEGATIVE
Nitrite: NEGATIVE
Protein, ur: NEGATIVE mg/dL
Specific Gravity, Urine: 1.011 (ref 1.005–1.030)
pH: 5 (ref 5.0–8.0)

## 2017-11-17 LAB — MRSA PCR SCREENING: MRSA by PCR: NEGATIVE

## 2017-11-17 LAB — COOXEMETRY PANEL
Carboxyhemoglobin: 1.7 % — ABNORMAL HIGH (ref 0.5–1.5)
Methemoglobin: 1.1 % (ref 0.0–1.5)
O2 Saturation: 65.6 %
TOTAL HEMOGLOBIN: 12.2 g/dL (ref 12.0–16.0)

## 2017-11-17 LAB — PROCALCITONIN: PROCALCITONIN: 0.44 ng/mL

## 2017-11-17 LAB — LACTIC ACID, PLASMA: Lactic Acid, Venous: 2.1 mmol/L (ref 0.5–1.9)

## 2017-11-17 MED ORDER — SIMETHICONE 80 MG PO CHEW
80.0000 mg | CHEWABLE_TABLET | Freq: Four times a day (QID) | ORAL | Status: DC
  Administered 2017-11-17 – 2017-11-20 (×13): 80 mg via ORAL
  Filled 2017-11-17 (×14): qty 1

## 2017-11-17 MED ORDER — MORPHINE SULFATE (PF) 2 MG/ML IV SOLN
1.0000 mg | INTRAVENOUS | Status: DC | PRN
  Administered 2017-11-17: 2 mg via INTRAVENOUS
  Filled 2017-11-17: qty 1

## 2017-11-17 MED ORDER — SODIUM CHLORIDE 0.9 % IV SOLN
1.0000 g | INTRAVENOUS | Status: DC
  Administered 2017-11-17 – 2017-11-20 (×4): 1 g via INTRAVENOUS
  Filled 2017-11-17 (×5): qty 1

## 2017-11-17 MED ORDER — NOREPINEPHRINE 4 MG/250ML-% IV SOLN
0.0000 ug/min | INTRAVENOUS | Status: DC
  Administered 2017-11-17: 2 ug/min via INTRAVENOUS
  Filled 2017-11-17: qty 250

## 2017-11-17 MED ORDER — VANCOMYCIN HCL IN DEXTROSE 1-5 GM/200ML-% IV SOLN
1000.0000 mg | INTRAVENOUS | Status: DC
  Administered 2017-11-17 – 2017-11-18 (×2): 1000 mg via INTRAVENOUS
  Filled 2017-11-17 (×3): qty 200

## 2017-11-17 MED ORDER — SIMETHICONE 80 MG PO CHEW
80.0000 mg | CHEWABLE_TABLET | Freq: Four times a day (QID) | ORAL | Status: DC | PRN
  Administered 2017-11-17: 80 mg via ORAL
  Filled 2017-11-17: qty 1

## 2017-11-17 MED ORDER — HYDROCODONE-ACETAMINOPHEN 5-325 MG PO TABS: 1.0000 | ORAL_TABLET | ORAL | Status: DC | PRN

## 2017-11-17 MED ORDER — FUROSEMIDE 10 MG/ML IJ SOLN: 80.0000 mg | Freq: Once | INTRAMUSCULAR | Status: DC

## 2017-11-17 MED ORDER — SORBITOL 70 % SOLN: 30.0000 mL | Freq: Once | Status: DC

## 2017-11-17 NOTE — Progress Notes (Addendum)
Pt's daughter called HF clinic with concerns for patient "shaking". He has also vomited again today. Arrived at bedside and pt is shivering. Temp 99.0. BP and HR stable.   Blood cultures sent this morning and vanc started empirically with leukocytosis.  Discussed with Dr [name]. Advised broadening abx coverage with cefepine and sending procalcitonin. I discussed with Dr Zigmund Daniel also and she agrees. Will follow closely.   Rhythm looks like he may have converted back to atrial flutter. HR now ~80. Will check EKG.   Georgiana Shore, NP

## 2017-11-17 NOTE — Progress Notes (Addendum)
Patient ID: Dillon Andrews, male   DOB: [birth year], 77 y.o.   MRN: [id number]     Advanced Heart Failure Rounding Note  PCP-Cardiologist: Glenetta Hew, MD   Subjective:    Back on torsemide. No weight yet this am, about net even yesterday. Creatinine stable 1.79. Coox 66% OFF milrinone (stopped 8/3). CVP 16-17.  Underwent successful TEE/DCCV 11/10/17. Atrial flutter recurred, had DCCV to NSR on 8/2. Remains in NSR 60s  He has started tafamidis.   SBP 90-100s.   Had some problems with constipation/abdominal pain and vomiting over the weekend. KUB this morning negative for obstruction. Has had a BM now and is no longer having pain.   He had some BRBPR yesterday when straining for BM. Now on scheduled dulcolax. Also had problems with PICC bleeding again. Hemoglobin stable 11.8  He feels very warm to touch. Denies cough, chills, or sweats. He is afebrile. WBC trending up 10.9 > 13.8. Afebrile. PICC site looks CDI, but has been changed frequently.   No CP, SOB, or dizziness. Feels weak.   Objective:   Weight Range: 170 lb 14.4 oz (77.5 kg) Body mass index is 25.24 kg/m.   Vital Signs:   Temp:  [97.9 F (36.6 C)-98.4 F (36.9 C)] 98.3 F (36.8 C) (08/05 0841) Pulse Rate:  [55-63] 56 (08/05 0841) Resp:  [14-28] 22 (08/05 0841) BP: (98-103)/(57-69) 98/66 (08/05 0841) SpO2:  [96 %-98 %] 98 % (08/05 0841) Last BM Date: 11/16/17  Weight change: Filed Weights   11/14/17 0920 11/15/17 0426 11/16/17 0434  Weight: 171 lb (77.6 kg) 172 lb 14.4 oz (78.4 kg) 170 lb 14.4 oz (77.5 kg)    Intake/Output:   Intake/Output Summary (Last 24 hours) at 11/17/2017 1013 Last data filed at 11/17/2017 0938 Gross per 24 hour  Intake 905 ml  Output 1475 ml  Net -570 ml      Physical Exam    CVP 17 General:  No resp difficulty. Hot to touch.  HEENT: Normal Neck: Supple. JVP ~12. Carotids 2+ bilat; no bruits. No thyromegaly or nodule noted. Cor: PMI nondisplaced. RRR, No M/G/R noted Lungs:  diminished throughout.  Abdomen: Soft, non-tender, non-distended, no HSM. No bruits or masses. +BS  Extremities: No cyanosis, clubbing, or rash. R and LLE no edema. RUE PICC CDI Neuro: Alert & orientedx3, cranial nerves grossly intact. moves all 4 extremities w/o difficulty. Affect pleasant   Telemetry   NSR 50-60s, personally reviewed.    EKG    Sinus brady 57 bpm. Personally reviewed.   Labs    CBC Recent Labs    11/17/17 0436  WBC 13.8*  HGB 11.8*  HCT 38.1*  MCV 89.4  PLT 932   Basic Metabolic Panel Recent Labs    11/16/17 0421 11/17/17 0436  NA 138 139  K 3.6 3.8  CL 92* 93*  CO2 35* 34*  GLUCOSE 122* 135*  BUN 33* 36*  CREATININE 1.78* 1.79*  CALCIUM 9.0 9.0  MG 2.0  --    Liver Function Tests No results for input(s): AST, ALT, ALKPHOS, BILITOT, PROT, ALBUMIN in the last 72 hours. No results for input(s): LIPASE, AMYLASE in the last 72 hours. Cardiac Enzymes No results for input(s): CKTOTAL, CKMB, CKMBINDEX, TROPONINI in the last 72 hours.  BNP: BNP (last 3 results) Recent Labs    06/25/17 1207 09/15/17 1401 11/05/17 1310  BNP 420.9* 393.0* 515.3*    ProBNP (last 3 results) No results for input(s): PROBNP in the last 8760 hours.  D-Dimer No results for input(s): DDIMER in the last 72 hours. Hemoglobin A1C No results for input(s): HGBA1C in the last 72 hours. Fasting Lipid Panel No results for input(s): CHOL, HDL, LDLCALC, TRIG, CHOLHDL, LDLDIRECT in the last 72 hours. Thyroid Function Tests No results for input(s): TSH, T4TOTAL, T3FREE, THYROIDAB in the last 72 hours.  Invalid input(s): FREET3  Other results:   Imaging    Dg Abd Portable 1v  Result Date: 11/17/2017 CLINICAL DATA:  77 year old male with abdominal pain and nausea with chest pain. Initial encounter. EXAM: PORTABLE ABDOMEN - 1 VIEW COMPARISON:  11/11/2017 chest x-ray.  11/25/2016 abdominal film. FINDINGS: Stool and gas filled portions of colon without gas distended  small bowel or large bowel. The possibility of free intraperitoneal air cannot be assessed on a supine view. Left base consolidation may represent atelectasis or infiltrate. Mild degenerative changes lumbar spine.  Post cholecystectomy. IMPRESSION: No evidence of bowel obstruction. Electronically Signed   By: Genia Del M.D.   On: 11/17/2017 06:55     Medications:     Scheduled Medications: . amiodarone  400 mg Oral BID  . apixaban  5 mg Oral BID  . bisacodyl  10 mg Oral Daily  . dronabinol  2.5 mg Oral BID AC  . famciclovir  250 mg Oral Daily  . feeding supplement (ENSURE ENLIVE)  237 mL Oral BID BM  . gabapentin  800 mg Oral QHS  . hydrocortisone   Rectal TID  . insulin aspart  0-15 Units Subcutaneous TID WC  . insulin aspart  0-5 Units Subcutaneous QHS  . insulin aspart  5 Units Subcutaneous TID WC  . LORazepam  1 mg Oral Once  . magnesium oxide  400 mg Oral Daily  . Melatonin  6 mg Oral QHS  . pantoprazole  40 mg Oral Daily  . rosuvastatin  10 mg Oral Daily  . sertraline  25 mg Oral Daily  . simethicone  80 mg Oral QID  . sodium chloride flush  10-40 mL Intracatheter Q12H  . sodium chloride flush  3 mL Intravenous Q12H  . spironolactone  25 mg Oral Daily  . Tafamidis Meglumine (Cardiac)  4 capsule Oral Daily  . torsemide  20 mg Oral Daily  . torsemide  40 mg Oral Daily  . traZODone  50 mg Oral QHS    Infusions: . sodium chloride 7 mL/hr at 11/14/17 6269    PRN Medications: sodium chloride, acetaminophen, LORazepam, morphine injection, ondansetron (ZOFRAN) IV, simethicone, sodium chloride flush, sodium chloride flush, zolpidem    Patient Profile   Dillon Andrews is a 77 y.o. male with a hx of chronic combined CHF due to NICM, CKD III,presumedNASH(per pt, never proven),esophageal varices per chart(prior glass of wine occasionally but no heavy alcohol),HTN, HLD, DM, Barrett's esophagus, GERD, anemia, RBBB, TTR amyloidosis, and Kappa Light Chain  myeloma.  Admitted to [facility] 11/05/17 in setting of new Afib with RVR.   Assessment/Plan   1. Acute on chronic combined HF with TTR amyloidosis (Wild Type) - NICM by cath at [facility] 09/2016 with EF 40-45%. Echo 06/2017: EF 25-30%, mild LAE, mild/mod RV dilation, mod/severe RA. - PYP scan strongly suggestive of TTR amyloid, SPEP with 0.8% m-spike, UPEP with Bence Jones Protein positive, kappa type. 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 - Dr Marin Olp saw and thinks he does not have AL amyloid (just multiple myeloma) and that he has TTR amyloid and should be treated with tafamidis before chemo. Tafamidis started on 8/2.   -  Volume status elevated with CVP 17 and sluggish UOP.  - Co-ox 66% off milrinone (stopped 8/3).   - Continue spiro 25 mg daily.  - No ARB/entresto with for now with CKD  2. New onset AFlutter  - likely triggered HF decompensation  - s/p TEE DCCV 7/29.   - Back in atrial flutter 7/31, repeat DCCV on 8/2.   - Continue amiodarone and Eliquis.   3. CKD III, baseline ~1.6 -Creatinine stable 1.69 > 1.77 > 1.93 > 1.76 > 1.76 > 1.78 > 1.79.   4. Recurrent bilateral effusions - Somewhat increased from March on CXR 11/05/17. Follow with diuresis. No change.   5. Kappa Lightchain Myeloma - Follows with Dr. Marin Olp who has seen this admission.  - Started on Bortezomib7/18/18andRevlimid7/23/19. - Continue management per primary team. - Dr Marin Olp thinks he does not have AL amyloid and that he does have TTR amyloid and should be treated with tafamodis before chemo. Now on Tafamodis  6. NASH with esophageal varices.  - Watch closely for bleeding with AC. Had some BRBPR with straining and bleeding at PICC site. Hemoglobin remains stable.  - Last EGD 06/21/2014 at [facility] for surveillance of Esophageal varices, Barrett's esophagus, and PMHx of Colon polyps. Showed two columns of small esophageal varices w/out stigmata, short segment Barrett's esophagus (without biopsies,  due to presence of EVs), 5cm hiatal hernia, and hyperplastic small gastric nodules.   7. DM2 - Per primary. No change.    8. Hypokalemia. - K 3.8  9. Depression - Continue sertraline 25 mg daily   10. ID - Feels hot to touch. - Afebrile, but WBC trending up 13.8 (10.9 on 8/2) - No cough, chills, or sweats. PICC line CDI. Feels weak.  - Check blood cultures and UA. Will start Vanc empirically until cultures come back. Leave in PICC for now. Discussed with Dr [name].   11. Constipation - KUB this am negative for obstruction - Continue dulcolax daily. He had a good BM yesterday.  Length of Stay: [place], NP  10:13 AM  Advanced Heart Failure Team Pager [phone] (M-F; 7a - 4p)  Please contact [facility] for night-coverage after hours (4p -7a ) and weekends on [email or web address]   Agree with above.   Patient developed progressive hypotension and low grade fevers throughout day and now seen with SBPs in 70. Feels weak. Concern for sepsis. CVP also up raisning concern for worsening RV failure off milrinone.   Exam Weak appearing pal JVP to ear Cor RRR Lungs cta Ab soft NT Ext warm no edema.   Will move to ICU. Bcx drawn this am. Start broad spectrum abx, Support BP with norepi. Repeat ECG to assess for recurrent AFL.   CRITICAL CARE Performed by: Glori Bickers  Total critical care time: 35 minutes  Critical care time was exclusive of separately billable procedures and treating other patients.  Critical care was necessary to treat or prevent imminent or life-threatening deterioration.  Critical care was time spent personally by me (independent of midlevel providers or residents) on the following activities: development of treatment plan with patient and/or surrogate as well as nursing, discussions with consultants, evaluation of patient's response to treatment, examination of patient, obtaining history from patient or surrogate, ordering and performing  treatments and interventions, ordering and review of laboratory studies, ordering and review of radiographic studies, pulse oximetry and re-evaluation of patient's condition.  Glori Bickers, MD  9:03 PM

## 2017-11-17 NOTE — Progress Notes (Signed)
PROGRESS NOTE    Dillon Andrews  KWI:[id number] DOB: 1940-09-27 DOA: 11/05/2017 PCP: [name], MD   Brief Narrative: 77 year old male with past medical history of liver cirrhosis with esophageal varices, combined systolic and diastolic CHF with ejection fraction of 25 to 30%, nonischemic cardiomyopathy, pulmonary hypertension, cardiac amyloidosis, CKD stage III, kappa light chain myeloma on chemotherapy was brought to the emergency department due to shortness of breath. Patient was found to be in A. fib with RVR with hypotension on presentation. Cardiology was consulted. Started on amiodarone drip. Also started on heparin for new onset A. fib. CHF team following, cardioversion on 7/29.  11/16/2017-patient and family requesting oxygen at night time at home.  Patient was not on oxygen at home prior to admission.     Assessment & Plan:   Principal Problem:   Atrial fibrillation with RVR (HCC) Active Problems:   CKD (chronic kidney disease) stage 3, GFR 30-59 ml/min (HCC), baseline Cr 1.5   DM2 (diabetes mellitus, type 2) (HCC)   Acute on chronic combined systolic and diastolic CHF (congestive heart failure) (HCC)   Bilateral pleural effusion   Esophageal varices in cirrhosis (HCC)   SOB (shortness of breath)   Non-ischemic cardiomyopathy ([place])   Other secondary pulmonary hypertension (HCC)   RBBB   Cardiac amyloidosis (HCC)   Kappa light chain myeloma (HCC)   Leukocytosis   Elevated troponin   Elevated brain natriuretic peptide (BNP) level, acute on chronic   Hypotension   Palliative care by specialist  Acute on chronic combined systolic and diastolic CHF, amyloidosis -2D echo 06/2017 showed EF of 25 to 30% with mild LAE, mild to moderate RV dilation moderate to severe RA. PYP scan suggestive of TTR amyloid. -Negative balance of13 L, weight down from 187--> 171lbs -CHF team following, IV Lasix discontinued, patient was placed on torsemide and continued on IV  milrinone. -Patient had TEE DCCV on 7/29 however reverted back to A. fib, plan for cardioversion8/05/2017  Active problems Acute new onset atrial fibrillation with RVR -Status post TEE DCCV on 7/29, briefly returned to normal sinus rhythm  - CHADS VASC 6, continue Eliquis -IV amiodarone transitioned to oral, per EP not an ablation candidate -S/Pcardioversion 8/2  Liver cirrhosis with esophageal varices -Currently no signs of bleeding, monitor closely on anticoagulation -Last EGD 06/21/2014 at [facility] showed esophageal varices, Barrett's esophagus, likely [name]. -Follow outpatient with gastroenterology  Kappa light chain myeloma -Follows Dr. Marin Olp, just had chemotherapy few days ago, was started on bortezomib on 10/20/2016 and Revlimid on 11/04/2017 -Dr. Marin Olp following   Mild acute on CKD stage III -Baseline around 1.6 -Creatinine improving, 1.7 today  Diabetes mellitus type 2-CBGs stable patient did not take anything at home for diabetes.  Blood sugar low at 51 and 84 today due to decreased p.o. intake.  If it remains low we will have to start him on some D5 until he start able to start taking p.o.  FEVER patient spiked a fever along with nausea vomiting and chills.  Fever work-up pending.  Cultures ordered.  Urine culture pending.  Done today shows improved aeration of the lungs with near complete resolution of right pleural effusion with residual trace left pleural effusion and unchanged right upper extremity PICC line.  I have also placed an order for right upper quadrant ultrasound to evaluate for gallstones.     DVT prophylaxis:APIXABAN Code Status:FULL Family Communication DAUGHTER IN ROOM Disposition Plan: TBD Consultants:  CARDIOLOGY  Procedures:TEE and cardioversion 11/10/2017 patient had  repeat cardioversion 11/14/2017    Antimicrobials:NONE  Subjective: Patient had an episode of fever and chills and nausea and vomiting.  When I saw him she was having  severe chills covered up with blankets.  He denies any shortness of breath cough chest pain or diarrhea.  However earlier when I saw him he had complained of right upper quadrant pain which is increased with breathing.  Objective: Vitals:   11/17/17 0841 11/17/17 1100 11/17/17 1357 11/17/17 1646  BP: 98/66  (!) 137/51 (!) 153/125  Pulse: (!) 56  (!) 57 79  Resp: (!) 22  (!) 34 (!) 26  Temp: 98.3 F (36.8 C) 98.6 F (37 C) 99 F (37.2 C) 98.9 F (37.2 C)  TempSrc: Oral Oral Oral Oral  SpO2: 98%  100% 96%  Weight:  77.7 kg (171 lb 4.8 oz)    Height:        Intake/Output Summary (Last 24 hours) at 11/17/2017 1706 Last data filed at 11/17/2017 6389 Gross per 24 hour  Intake 655 ml  Output 375 ml  Net 280 ml   Filed Weights   11/15/17 0426 11/16/17 0434 11/17/17 1100  Weight: 78.4 kg (172 lb 14.4 oz) 77.5 kg (170 lb 14.4 oz) 77.7 kg (171 lb 4.8 oz)    Examination:  General exam: Appears calm and comfortable  Respiratory system: Clear to auscultation. Respiratory effort normal. Cardiovascular system: S1 & S2 heard, RRR. No JVD, murmurs, rubs, gallops or clicks. No pedal edema. Gastrointestinal system: Abdomen is nondistended, soft and nontender. No organomegaly or masses felt. Normal bowel sounds heard. Central nervous system: Alert and oriented. No focal neurological deficits. Extremities: Symmetric 5 x 5 power. Skin: No rashes, lesions or ulcers Psychiatry: Judgement and insight appear normal. Mood & affect appropriate.     Data Reviewed: I have personally reviewed following labs and imaging studies  CBC: Recent Labs  Lab 11/11/17 0546 11/14/17 0500 11/17/17 0436  WBC 11.6* 10.9* 13.8*  HGB 12.3* 12.2* 11.8*  HCT 39.4 38.8* 38.1*  MCV 89.3 88.4 89.4  PLT 170 188 373   Basic Metabolic Panel: Recent Labs  Lab 11/11/17 0546 11/12/17 0610 11/13/17 0500 11/14/17 0500 11/15/17 0406 11/16/17 0421 11/17/17 0436  NA 139 140 141 140 138 138 139  K 3.8 3.6 3.7 3.6  3.8 3.6 3.8  CL 92* 93* 89* 93* 94* 92* 93*  CO2 36* 35* 38* 36* 34* 35* 34*  GLUCOSE 118* 138* 137* 95 135* 122* 135*  BUN 40* 40* 38* 37* 33* 33* 36*  CREATININE 1.69* 1.77* 1.93* 1.76* 1.76* 1.78* 1.79*  CALCIUM 8.8* 8.7* 9.2 8.7* 8.7* 9.0 9.0  MG 2.1 1.9  --   --   --  2.0  --    GFR: Estimated Creatinine Clearance: 34 mL/min (A) (by C-G formula based on SCr of 1.79 mg/dL (H)). Liver Function Tests: No results for input(s): AST, ALT, ALKPHOS, BILITOT, PROT, ALBUMIN in the last 168 hours. No results for input(s): LIPASE, AMYLASE in the last 168 hours. No results for input(s): AMMONIA in the last 168 hours. Coagulation Profile: No results for input(s): INR, PROTIME in the last 168 hours. Cardiac Enzymes: No results for input(s): CKTOTAL, CKMB, CKMBINDEX, TROPONINI in the last 168 hours. BNP (last 3 results) No results for input(s): PROBNP in the last 8760 hours. HbA1C: No results for input(s): HGBA1C in the last 72 hours. CBG: Recent Labs  Lab 11/16/17 2112 11/17/17 0743 11/17/17 1207 11/17/17 1422 11/17/17 1644  GLUCAP 217* 167* 270*  183* 51*   Lipid Profile: No results for input(s): CHOL, HDL, LDLCALC, TRIG, CHOLHDL, LDLDIRECT in the last 72 hours. Thyroid Function Tests: No results for input(s): TSH, T4TOTAL, FREET4, T3FREE, THYROIDAB in the last 72 hours. Anemia Panel: No results for input(s): VITAMINB12, FOLATE, FERRITIN, TIBC, IRON, RETICCTPCT in the last 72 hours. Sepsis Labs: No results for input(s): PROCALCITON, LATICACIDVEN in the last 168 hours.  No results found for this or any previous visit (from the past 240 hour(s)).       Radiology Studies: Dg Chest 1 View  Result Date: 11/17/2017 CLINICAL DATA:  77 year old male with a history of fever EXAM: CHEST  1 VIEW COMPARISON:  11/11/2017, 11/05/2017 FINDINGS: Cardiomediastinal silhouette unchanged. Calcifications of the aortic arch. Unchanged right upper extremity PICC which appears to terminate superior  vena cava. Improving aeration at the lung bases with improved visualization of the right hemidiaphragm and the right heart border. Persistent opacity at the left base partially obscuring the left hemidiaphragm with blunting of left costophrenic angle. No pneumothorax. IMPRESSION: Improving aeration of the lungs, with near complete resolution of right pleural effusion, with residual trace left pleural effusion. Unchanged right upper extremity PICC Electronically Signed   By: Corrie Mckusick D.O.   On: 11/17/2017 15:51   Dg Abd Portable 1v  Result Date: 11/17/2017 CLINICAL DATA:  77 year old male with abdominal pain and nausea with chest pain. Initial encounter. EXAM: PORTABLE ABDOMEN - 1 VIEW COMPARISON:  11/11/2017 chest x-ray.  11/25/2016 abdominal film. FINDINGS: Stool and gas filled portions of colon without gas distended small bowel or large bowel. The possibility of free intraperitoneal air cannot be assessed on a supine view. Left base consolidation may represent atelectasis or infiltrate. Mild degenerative changes lumbar spine.  Post cholecystectomy. IMPRESSION: No evidence of bowel obstruction. Electronically Signed   By: Genia Del M.D.   On: 11/17/2017 06:55        Scheduled Meds: . amiodarone  400 mg Oral BID  . apixaban  5 mg Oral BID  . bisacodyl  10 mg Oral Daily  . dronabinol  2.5 mg Oral BID AC  . famciclovir  250 mg Oral Daily  . feeding supplement (ENSURE ENLIVE)  237 mL Oral BID BM  . furosemide  80 mg Intravenous Once  . gabapentin  800 mg Oral QHS  . hydrocortisone   Rectal TID  . insulin aspart  0-15 Units Subcutaneous TID WC  . insulin aspart  0-5 Units Subcutaneous QHS  . insulin aspart  5 Units Subcutaneous TID WC  . LORazepam  1 mg Oral Once  . magnesium oxide  400 mg Oral Daily  . Melatonin  6 mg Oral QHS  . pantoprazole  40 mg Oral Daily  . rosuvastatin  10 mg Oral Daily  . sertraline  25 mg Oral Daily  . simethicone  80 mg Oral QID  . sodium chloride flush   10-40 mL Intracatheter Q12H  . sodium chloride flush  3 mL Intravenous Q12H  . sorbitol  30 mL Oral Once  . spironolactone  25 mg Oral Daily  . Tafamidis Meglumine (Cardiac)  4 capsule Oral Daily  . traZODone  50 mg Oral QHS   Continuous Infusions: . sodium chloride 7 mL/hr at 11/14/17 0722  . ceFEPime (MAXIPIME) IV 1 g (11/17/17 1605)  . vancomycin 1,000 mg (11/17/17 1249)     LOS: 12 days     Georgette Shell, MD [facility]  If 7PM-7AM, please contact night-coverage [email or web address] Password [email or web address]  11/17/2017, 5:06 PM

## 2017-11-17 NOTE — Plan of Care (Signed)
  Problem: Health Behavior/Discharge Planning: Goal: Ability to safely manage health-related needs after discharge will improve Outcome: Progressing

## 2017-11-17 NOTE — Significant Event (Signed)
Overview:  NA reported to RN a low BP, 70s/50s. RN re-checked BP manually and confirmed first reading. Pt remained alert and oriented and talked with family and staff during this time. MD paged and requested closely monitoring pt and re-checking after 30 minutes. Pt's recheck remained low and MD paged with result.     Initial Focused Assessment:   Interventions: Close monitoring Plan of Care (if not transferred): Transferring to [place] for close monitoring of BP Event Summary:   at      at          [facility]

## 2017-11-17 NOTE — Progress Notes (Signed)
PT Cancellation Note  Patient Details Name: Dillon Andrews MRN: [id number] DOB: [birth year]   Cancelled Treatment:    Reason Eval/Treat Not Completed: Other (comment).  Pt refused over not feeling well, and even declined bed ex's as he is having stomach symptoms.  Follow acutely and progress as tolerated, and will ck on him at another time to resume therapy.   Ramond Dial 11/17/2017, 12:01 PM   Mee Hives, PT MS Acute Rehab Dept. Number: [place] and [place]

## 2017-11-17 NOTE — Progress Notes (Signed)
Patient is still having pain under his diaphragm and it increases with deep breaths, EKG done with no changes and VSS, rapid response RN already on unit and went in to see pt and his portable KUB was done and will await results, text paged MD, awaiting further orders.

## 2017-11-17 NOTE — Plan of Care (Signed)
Received patient transfer from 6W via bed.  Patient transferred over to bed and placed on monitor upon arrival.  Blood pressure remains low as charted and patient started on Levo gtt as ordered.  Patient denies pain or discomfort at this time.  Asymptomatic.  Will monitor.

## 2017-11-17 NOTE — Significant Event (Signed)
Rapid Response Event Note  Overview:    Pt had a blood glucose of 51 and was given two orange juices. 15 minute recheck was 84.  Initial Focused Assessment: Pt alert and oriented  Interventions: Orange juice x2 Plan of Care (if not transferred): Will monitor Event Summary:   at      at          [facility]

## 2017-11-17 NOTE — Progress Notes (Signed)
   RN reported BP downtrending to 70s/50s this evening. Patient is asymptomatic from a BP standpoint. There were concerns for possible infection earlier today given rigors, mild temp elevation to 1F, and leukocytosis. Antibiotics were broadened and infectious work-up is pending including BCx/UCx, and RUQ [place]. Discussed need for BP support with Dr. [name] who recommended transfer to [facility] for norepinephrine infusion. RN updated to plan. Will continue to monitor BP closely.   Abigail Butts, PA-C

## 2017-11-17 NOTE — Progress Notes (Addendum)
Pharmacy Antibiotic Note  Dillon Andrews is a 77 y.o. male admitted on 11/05/2017 with AoCHF.  Pharmacy has been consulted for vancomycin dosing with elevated WBC and concern for PICC line infection. SCr has remained stable ~1.7, pt afebrile, MRSA screen negative.  Plan: -Vancomycin 1028m IV q24h -Follow cultures, renal function -Vancomycin level as needed  Addendum: Add cefepime for gram negative coverage.  Height: 5' 9"  (175.3 cm) Weight: 170 lb 14.4 oz (77.5 kg) IBW/kg (Calculated) : 70.7  Temp (24hrs), Avg:98.2 F (36.8 C), Min:97.9 F (36.6 C), Max:98.4 F (36.9 C)  Recent Labs  Lab 11/11/17 0546  11/13/17 0500 11/14/17 0500 11/15/17 0406 11/16/17 0421 11/17/17 0436  WBC 11.6*  --   --  10.9*  --   --  13.8*  CREATININE 1.69*   < > 1.93* 1.76* 1.76* 1.78* 1.79*   < > = values in this interval not displayed.    Estimated Creatinine Clearance: 34 mL/min (A) (by C-G formula based on SCr of 1.79 mg/dL (H)).    No Known Allergies  Antimicrobials this admission: Vancomycin 8/5 >>  Dose adjustments this admission: none  Microbiology results: 7/24 MRSA PCR: negative  Thank you for allowing pharmacy to be a part of this patient's care.  MArrie Senate PharmD, BCPS Clinical Pharmacist [phone] check AMION for all M[place]numbers 11/17/2017

## 2017-11-18 ENCOUNTER — Inpatient Hospital Stay (HOSPITAL_COMMUNITY): Payer: Medicare HMO

## 2017-11-18 LAB — BASIC METABOLIC PANEL
Anion gap: 13 (ref 5–15)
BUN: 53 mg/dL — ABNORMAL HIGH (ref 8–23)
CO2: 32 mmol/L (ref 22–32)
Calcium: 8.6 mg/dL — ABNORMAL LOW (ref 8.9–10.3)
Chloride: 89 mmol/L — ABNORMAL LOW (ref 98–111)
Creatinine, Ser: 2.75 mg/dL — ABNORMAL HIGH (ref 0.61–1.24)
GFR calc Af Amer: 24 mL/min — ABNORMAL LOW (ref >60)
GFR calc non Af Amer: 21 mL/min — ABNORMAL LOW (ref >60)
Glucose, Bld: 180 mg/dL — ABNORMAL HIGH (ref 70–99)
Potassium: 4.3 mmol/L (ref 3.5–5.1)
Sodium: 134 mmol/L — ABNORMAL LOW (ref 135–145)

## 2017-11-18 LAB — GLUCOSE, CAPILLARY
GLUCOSE-CAPILLARY: 126 mg/dL — AB (ref 70–99)
Glucose-Capillary: 294 mg/dL — ABNORMAL HIGH (ref 70–99)
Glucose-Capillary: 206 mg/dL — ABNORMAL HIGH (ref 70–99)
Glucose-Capillary: 125 mg/dL — ABNORMAL HIGH (ref 70–99)

## 2017-11-18 LAB — LACTIC ACID, PLASMA: LACTIC ACID, VENOUS: 2.2 mmol/L — AB (ref 0.5–1.9)

## 2017-11-18 LAB — COOXEMETRY PANEL
Carboxyhemoglobin: 1.8 % — ABNORMAL HIGH (ref 0.5–1.5)
Methemoglobin: 0.9 % (ref 0.0–1.5)
O2 Saturation: 71.1 %
Total hemoglobin: 11.7 g/dL — ABNORMAL LOW (ref 12.0–16.0)

## 2017-11-18 LAB — CBC
HCT: 37.6 % — ABNORMAL LOW (ref 39.0–52.0)
HEMOGLOBIN: 11.6 g/dL — AB (ref 13.0–17.0)
MCH: 27.9 pg (ref 26.0–34.0)
MCHC: 30.9 g/dL (ref 30.0–36.0)
MCV: 90.4 fL (ref 78.0–100.0)
PLATELETS: 130 10*3/uL — AB (ref 150–400)
RBC: 4.16 MIL/uL — ABNORMAL LOW (ref 4.22–5.81)
RDW: 18.3 % — AB (ref 11.5–15.5)
WBC: 11.9 10*3/uL — AB (ref 4.0–10.5)

## 2017-11-18 LAB — PROCALCITONIN: Procalcitonin: 9.48 ng/mL

## 2017-11-18 MED ORDER — CHLORHEXIDINE GLUCONATE CLOTH 2 % EX PADS
6.0000 | MEDICATED_PAD | Freq: Every day | CUTANEOUS | Status: DC
  Administered 2017-11-18 – 2017-11-19 (×2): 6 via TOPICAL

## 2017-11-18 MED ORDER — SODIUM CHLORIDE 0.9% FLUSH: 10.0000 mL | INTRAVENOUS | Status: DC | PRN

## 2017-11-18 MED ORDER — SODIUM CHLORIDE 0.9% FLUSH: 10.0000 mL | Freq: Two times a day (BID) | INTRAVENOUS | Status: DC

## 2017-11-18 NOTE — Progress Notes (Addendum)
PROGRESS NOTE    Dillon Andrews  PIR:[id number] DOB: April 08, 1941 DOA: 11/05/2017 PCP: [name], MD   Brief Narrative77 year old male with past medical history of liver cirrhosis with esophageal varices, combined systolic and diastolic CHF with ejection fraction of 25 to 30%, nonischemic cardiomyopathy, pulmonary hypertension, cardiac amyloidosis, CKD stage III, kappa light chain myeloma on chemotherapy was brought to the emergency department due to shortness of breath. Patient was found to be in A. fib with RVR with hypotension on presentation. Cardiology was consulted. Started on amiodarone drip. Also started on heparin for new onset A. fib. CHF team following, cardioversion on 7/29.  11/16/2017-patient and family requesting oxygen at night time at home. Patient was not on oxygen at home prior to admission.  11/18/2017 patient was transferred to [facility] for close monitoring since he became hypotensive and had episode of bradycardia.  He was started on NOR EPI with improvement in his blood pressure.  Actiq acid level 2.1 procalcitonin 0.44 up to 9.48.  White count prior to transfer was 13.8 down to 11.9 today patient was started empirically on Vanco and cefepime.  He does have a PICC line in place through which he is getting norepinephrine.  Assessment & Plan:   Principal Problem:   Atrial fibrillation with RVR (HCC) Active Problems:   CKD (chronic kidney disease) stage 3, GFR 30-59 ml/min (HCC), baseline Cr 1.5   DM2 (diabetes mellitus, type 2) (HCC)   Acute on chronic combined systolic and diastolic CHF (congestive heart failure) (HCC)   Bilateral pleural effusion   Esophageal varices in cirrhosis (HCC)   SOB (shortness of breath)   Non-ischemic cardiomyopathy ([place])   Other secondary pulmonary hypertension (HCC)   RBBB   Cardiac amyloidosis (HCC)   Kappa light chain myeloma (HCC)   Leukocytosis   Elevated troponin   Elevated brain natriuretic peptide (BNP) level, acute  on chronic   Hypotension   Palliative care by specialist Acute on chronic combined systolic and diastolic CHF, amyloidosis -2D echo 06/2017 showed EF of 25 to 30% with mild LAE, mild to moderate RV dilation moderate to severe RA. PYP scan suggestive of TTR amyloid. -Negative balance of13 L, weight down from 187-->171lbs -CHF team following -Patient had TEE DCCV on 7/29 however reverted back to A. fib, had cardioversion8/05/2017 back in flutter 8/ 5 back in normal sinus rhythm this morning.  Patient on amiodarone.  Was on milrinone stopped 11/15/2017 -on TAFAMIDIS.  Active problems Acute new onset atrial fibrillation with RVR -Status post TEE DCCV on 7/29, briefly returned to normal sinus rhythm  - CHADS VASC 6, continue Eliquis -IV amiodarone transitioned to oral, per EP not an ablation candidate -S/Pcardioversion 8/2  Liver cirrhosis with esophageal varices -Currently no signs of bleeding, monitor closely on anticoagulation -Last EGD 06/21/2014 at [facility] showed esophageal varices, Barrett's esophagus, likely [name]. -Follow outpatient with gastroenterology  Kappa light chain myeloma -Follows Dr. Marin Olp, just had chemotherapy few days ago, was started on bortezomib on 10/20/2016 and Revlimid on 11/04/2017 -Dr. Marin Olp following   acute on CKD stage III -Baseline around 1.6 -Creatinine 2.75 today multifactorial with hypotension diuretics of spironolactone and possible sepsis.  Follow daily.  Diabetes mellitus type 2-waiting to hear from family if he was taking anything at home.HBAIC 7.5.Continue ssi for now.family reports he used to take glipizide 2.5 mg bid in the past which his pcp had stopped.consider restarting once po intake adequate.  Sepsis unclear etiology rule out PICC line infection.  Blood cultures pending.  PICC line to come out once he is done with norepi.  Leukocytosis improved.  Hypotension improved.  Right upper quadrant ultrasound no evidence of gallstones.   Continue empiric antibiotics for now.  Chest x-ray shows near complete resolution of right effusion and residual trace left effusion with improved aeration of the lungs.  UA is unremarkable.   DVT prophylaxis: Apixaban Code Status: Full code Family Communication: None Disposition Plan: Per cardiology   Consultants: CHF team  Procedures: TEE and cardioversion 11/10/2017 patient had repeat cardioversion 11/14/2017   Antimicrobials: none  Subjective: Feels better no more nausea or vomiting denies any abdominal pain chest pain shortness of breath.  Appetite is good asking for breakfast.   Objective: Vitals:   11/18/17 1415 11/18/17 1430 11/18/17 1445 11/18/17 1500  BP: (!) 100/59 101/62 100/68 104/64  Pulse: (!) 58 (!) 55 (!) 57 (!) 59  Resp: 16 15 15 15   Temp:      TempSrc:      SpO2: 90% 96% 96% 95%  Weight:      Height:        Intake/Output Summary (Last 24 hours) at 11/18/2017 1527 Last data filed at 11/18/2017 1500 Gross per 24 hour  Intake 1342.22 ml  Output -  Net 1342.22 ml   Filed Weights   11/17/17 1100 11/17/17 2005 11/18/17 0407  Weight: 77.7 kg (171 lb 4.8 oz) 79 kg (174 lb 2.6 oz) 80 kg (176 lb 5.9 oz)    Examination:  General exam: Appears calm and comfortable  Respiratory system: Decreased breath sounds at the bases to auscultation. Respiratory effort normal. Cardiovascular system: S1 & S2 heard, RRR. No JVD, murmurs, rubs, gallops or clicks. No pedal edema. Gastrointestinal system: Abdomen is nondistended, soft and nontender. No organomegaly or masses felt. Normal bowel sounds heard. Central nervous system: Alert and oriented. No focal neurological deficits. Extremities: Symmetric 5 x 5 power. Skin: No rashes, lesions or ulcers Psychiatry: Judgement and insight appear normal. Mood & affect appropriate.     Data Reviewed: I have personally reviewed following labs and imaging studies  CBC: Recent Labs  Lab 11/14/17 0500 11/17/17 0436 11/18/17 1113    WBC 10.9* 13.8* 11.9*  HGB 12.2* 11.8* 11.6*  HCT 38.8* 38.1* 37.6*  MCV 88.4 89.4 90.4  PLT 188 153 655*   Basic Metabolic Panel: Recent Labs  Lab 11/12/17 0610  11/14/17 0500 11/15/17 0406 11/16/17 0421 11/17/17 0436 11/18/17 0409  NA 140   < > 140 138 138 139 134*  K 3.6   < > 3.6 3.8 3.6 3.8 4.3  CL 93*   < > 93* 94* 92* 93* 89*  CO2 35*   < > 36* 34* 35* 34* 32  GLUCOSE 138*   < > 95 135* 122* 135* 180*  BUN 40*   < > 37* 33* 33* 36* 53*  CREATININE 1.77*   < > 1.76* 1.76* 1.78* 1.79* 2.75*  CALCIUM 8.7*   < > 8.7* 8.7* 9.0 9.0 8.6*  MG 1.9  --   --   --  2.0  --   --    < > = values in this interval not displayed.   GFR: Estimated Creatinine Clearance: 22.1 mL/min (A) (by C-G formula based on SCr of 2.75 mg/dL (H)). Liver Function Tests: No results for input(s): AST, ALT, ALKPHOS, BILITOT, PROT, ALBUMIN in the last 168 hours. No results for input(s): LIPASE, AMYLASE in the last 168 hours. No results for input(s): AMMONIA in the last 168  hours. Coagulation Profile: No results for input(s): INR, PROTIME in the last 168 hours. Cardiac Enzymes: No results for input(s): CKTOTAL, CKMB, CKMBINDEX, TROPONINI in the last 168 hours. BNP (last 3 results) No results for input(s): PROBNP in the last 8760 hours. HbA1C: No results for input(s): HGBA1C in the last 72 hours. CBG: Recent Labs  Lab 11/17/17 1644 11/17/17 1712 11/17/17 2111 11/18/17 0628 11/18/17 1141  GLUCAP 51* 84 125* 126* 294*   Lipid Profile: No results for input(s): CHOL, HDL, LDLCALC, TRIG, CHOLHDL, LDLDIRECT in the last 72 hours. Thyroid Function Tests: No results for input(s): TSH, T4TOTAL, FREET4, T3FREE, THYROIDAB in the last 72 hours. Anemia Panel: No results for input(s): VITAMINB12, FOLATE, FERRITIN, TIBC, IRON, RETICCTPCT in the last 72 hours. Sepsis Labs: Recent Labs  Lab 11/17/17 1618 11/17/17 2016 11/18/17 0409  PROCALCITON 0.44  --  9.48  LATICACIDVEN  --  2.1*  --     Recent  Results (from the past 240 hour(s))  Culture, blood (Routine X 2) w Reflex to ID Panel     Status: None (Preliminary result)   Collection Time: 11/17/17 11:16 AM  Result Value Ref Range Status   Specimen Description BLOOD LEFT ANTECUBITAL  Final   Special Requests   Final    BOTTLES DRAWN AEROBIC AND ANAEROBIC Blood Culture adequate volume   Culture   Final    NO GROWTH 1 DAY Performed at [facility], [place] [address] [place], [place] [address]    Report Status PENDING  Incomplete  Culture, blood (Routine X 2) w Reflex to ID Panel     Status: None (Preliminary result)   Collection Time: 11/17/17 11:22 AM  Result Value Ref Range Status   Specimen Description BLOOD LEFT HAND  Final   Special Requests   Final    BOTTLES DRAWN AEROBIC AND ANAEROBIC Blood Culture adequate volume   Culture   Final    NO GROWTH 1 DAY Performed at [facility], [place] [address] [place], [place] [address]    Report Status PENDING  Incomplete  MRSA PCR Screening     Status: None   Collection Time: 11/17/17  8:27 PM  Result Value Ref Range Status   MRSA by PCR NEGATIVE NEGATIVE Final    Comment:        The GeneXpert MRSA Assay (FDA approved for NASAL specimens only), is one component of a comprehensive MRSA colonization surveillance program. It is not intended to diagnose MRSA infection nor to guide or monitor treatment for MRSA infections. Performed at [facility], [place] [address] [place], [place] [address]          Radiology Studies: Dg Chest 1 View  Result Date: 11/17/2017 CLINICAL DATA:  77 year old male with a history of fever EXAM: CHEST  1 VIEW COMPARISON:  11/11/2017, 11/05/2017 FINDINGS: Cardiomediastinal silhouette unchanged. Calcifications of the aortic arch. Unchanged right upper extremity PICC which appears to terminate superior vena cava. Improving aeration at the lung bases with improved visualization of the right hemidiaphragm and the right heart border.  Persistent opacity at the left base partially obscuring the left hemidiaphragm with blunting of left costophrenic angle. No pneumothorax. IMPRESSION: Improving aeration of the lungs, with near complete resolution of right pleural effusion, with residual trace left pleural effusion. Unchanged right upper extremity PICC Electronically Signed   By: Corrie Mckusick D.O.   On: 11/17/2017 15:51   Dg Abd Portable 1v  Result Date: 11/17/2017 CLINICAL DATA:  78 year old male  with abdominal pain and nausea with chest pain. Initial encounter. EXAM: PORTABLE ABDOMEN - 1 VIEW COMPARISON:  11/11/2017 chest x-ray.  11/25/2016 abdominal film. FINDINGS: Stool and gas filled portions of colon without gas distended small bowel or large bowel. The possibility of free intraperitoneal air cannot be assessed on a supine view. Left base consolidation may represent atelectasis or infiltrate. Mild degenerative changes lumbar spine.  Post cholecystectomy. IMPRESSION: No evidence of bowel obstruction. Electronically Signed   By: Genia Del M.D.   On: 11/17/2017 06:55   US Abdomen Limited Ruq  Result Date: 11/18/2017 CLINICAL DATA:  Acute onset of vomiting. EXAM: ULTRASOUND ABDOMEN LIMITED RIGHT UPPER QUADRANT COMPARISON:  Right upper quadrant ultrasound performed 08/01/2017 FINDINGS: Gallbladder: Status post cholecystectomy.  No retained stones seen. Common bile duct: Diameter: 0.2 cm, within normal limits in caliber. Liver: No focal lesion identified. Within normal limits in parenchymal echogenicity. Portal vein is patent on color Doppler imaging with normal direction of blood flow towards the liver. A small right-sided pleural effusion is noted. IMPRESSION: 1. Status post cholecystectomy.  No retained stones seen. 2. Small right-sided pleural effusion noted. Electronically Signed   By: Garald Balding M.D.   On: 11/18/2017 03:56        Scheduled Meds: . amiodarone  400 mg Oral BID  . apixaban  5 mg Oral BID  . bisacodyl  10  mg Oral Daily  . Chlorhexidine Gluconate Cloth  6 each Topical Daily  . dronabinol  2.5 mg Oral BID AC  . famciclovir  250 mg Oral Daily  . feeding supplement (ENSURE ENLIVE)  237 mL Oral BID BM  . gabapentin  800 mg Oral QHS  . hydrocortisone   Rectal TID  . insulin aspart  0-15 Units Subcutaneous TID WC  . insulin aspart  0-5 Units Subcutaneous QHS  . insulin aspart  5 Units Subcutaneous TID WC  . LORazepam  1 mg Oral Once  . magnesium oxide  400 mg Oral Daily  . Melatonin  6 mg Oral QHS  . pantoprazole  40 mg Oral Daily  . rosuvastatin  10 mg Oral Daily  . sertraline  25 mg Oral Daily  . simethicone  80 mg Oral QID  . sodium chloride flush  10-40 mL Intracatheter Q12H  . sodium chloride flush  10-40 mL Intracatheter Q12H  . sodium chloride flush  3 mL Intravenous Q12H  . sorbitol  30 mL Oral Once  . Tafamidis Meglumine (Cardiac)  4 capsule Oral Daily  . traZODone  50 mg Oral QHS   Continuous Infusions: . sodium chloride 10 mL/hr at 11/18/17 1500  . ceFEPime (MAXIPIME) IV Stopped (11/17/17 1635)  . norepinephrine (LEVOPHED) Adult infusion Stopped (11/18/17 1326)  . vancomycin Stopped (11/18/17 1232)     LOS: 13 days     Georgette Shell, MD [facility]  If 7PM-7AM, please contact night-coverage [email or web address] Password [email or web address] 11/18/2017, 3:27 PM

## 2017-11-18 NOTE — Progress Notes (Addendum)
Physical Therapy Treatment Patient Details Name: Dillon Andrews MRN: [id number] DOB: 09/13/1940 Today's Date: 11/18/2017    History of Present Illness Pt is a 77 y/o male admitted 11/05/17 secondary to worsening SOB and chest tightness; found to have a fib with RVR. S/p TEE and cardioversion 7/29. 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 PMH inlcudes kappa light chain myeloma (currently undergoing chemotherapy), CHF, nonischemic cardiomyopathy, pulmonary HTN, CKD.     PT Comments    Pt sleeping soundly on arrival with increased time to wake pt. PT moving well and able to return to walking in hall today. Pt with limited distance today reporting leg weakness with pt educated for HEP upon return to room. Pt encouraged to continue HEP and mobility with nursing for safe return home and will plan to attempt stairs next session. HR 58-65 with gait. SpO2 maintained 94-99% on RA, BP 106/68.     Follow Up Recommendations  Home health PT;Supervision for mobility/OOB     Equipment Recommendations  None recommended by PT    Recommendations for Other Services       Precautions / Restrictions Precautions Precautions: Fall    Mobility  Bed Mobility Overal bed mobility: Modified Independent Bed Mobility: Supine to Sit              Transfers Overall transfer level: Needs assistance   Transfers: Sit to/from Stand Sit to Stand: Supervision         General transfer comment: supervision for safety and lines with cues for hand placement  Ambulation/Gait Ambulation/Gait assistance: Min guard Gait Distance (Feet): 200 Feet Assistive device: Rolling walker (2 wheeled) Gait Pattern/deviations: Step-through pattern;Decreased stride length;Trunk flexed   Gait velocity interpretation: >2.62 ft/sec, indicative of community ambulatory General Gait Details: cues for posture, position in rW and directional cues   Stairs             Wheelchair  Mobility    Modified Rankin (Stroke Patients Only)       Balance Overall balance assessment: Needs assistance   Sitting balance-Leahy Scale: Good       Standing balance-Leahy Scale: Fair                              Cognition Arousal/Alertness: Awake/alert Behavior During Therapy: WFL for tasks assessed/performed Overall Cognitive Status: Within Functional Limits for tasks assessed                                        Exercises General Exercises - Lower Extremity Long Arc Quad: 10 reps;Both;AROM;15 reps Hip Flexion/Marching: Both;Seated;AROM;15 reps    General Comments        Pertinent Vitals/Pain Pain Assessment: No/denies pain    Home Living                      Prior Function            PT Goals (current goals can now be found in the care plan section) Progress towards PT goals: Progressing toward goals    Frequency           PT Plan Current plan remains appropriate    Co-evaluation              AM-PAC PT "6 Clicks" Daily Activity  Outcome Measure  Difficulty turning over  in bed (including adjusting bedclothes, sheets and blankets)?: None Difficulty moving from lying on back to sitting on the side of the bed? : None Difficulty sitting down on and standing up from a chair with arms (e.g., wheelchair, bedside commode, etc,.)?: A Little Help needed moving to and from a bed to chair (including a wheelchair)?: A Little Help needed walking in hospital room?: A Little Help needed climbing 3-5 steps with a railing? : A Little 6 Click Score: 20    End of Session Equipment Utilized During Treatment: Gait belt Activity Tolerance: Patient tolerated treatment well Patient left: in chair;with call bell/phone within reach;with chair alarm set Nurse Communication: Mobility status PT Visit Diagnosis: Unsteadiness on feet (R26.81);Muscle weakness (generalized) (M62.81);Other abnormalities of gait and mobility  (R26.89)     Time: 4370-0525 PT Time Calculation (min) (ACUTE ONLY): 29 min  Charges:  $Gait Training: 8-22 mins $Therapeutic Exercise: 8-22 mins                     [address], [place]    [place] 11/18/2017, 11:44 AM

## 2017-11-18 NOTE — Progress Notes (Signed)
CRITICAL VALUE ALERT  Critical Value:  Lactic acid 2.2    Date & Time Notied:  11/18/2017  At 1720  Provider Notified: paged MD Rodena Piety  Orders Received/Actions taken: spoke with MD Rodena Piety, no new orders received.

## 2017-11-18 NOTE — Progress Notes (Addendum)
Patient ID: Dillon Andrews, male   DOB: 1941/04/06, 77 y.o.   MRN: [id number]     Advanced Heart Failure Rounding Note  PCP-Cardiologist: Glenetta Hew, MD   Subjective:    Moved to 2H with SBP 70s. Started on norepi. Now @ 3. SBP 90s.  Very little UOP yesterday. Did not receive IV lasix with low BPs. Creatinine up to 1.79>2.75 this am. Coox 71%. CVP 10 this am.   Lactic acid 2.1. Procalcitonin 9.48. CXR 8/5 shows improvement in lung aeration, trace left pleural effusion. RUQ [place] with no acute findings. Remains on vanc and cefepime. Afebrile.   Underwent successful TEE/DCCV 11/10/17. Atrial flutter recurred, had DCCV to NSR on 8/2. Remains in NSR 60s. Back in aflutter on 8/5. Back in NSR this am. Remains on PO amio.   He has started tafamidis.   Feels better this morning. Wants breakfast. Denies CP, SOB, fever, or chills.   Objective:   Weight Range: 176 lb 5.9 oz (80 kg) Body mass index is 26.05 kg/m.   Vital Signs:   Temp:  [97.2 F (36.2 C)-99 F (37.2 C)] 97.8 F (36.6 C) (08/06 0749) Pulse Rate:  [57-101] 59 (08/06 0815) Resp:  [13-34] 13 (08/06 0815) BP: (72-153)/(50-125) 92/62 (08/06 0815) SpO2:  [94 %-100 %] 98 % (08/06 0815) Weight:  [171 lb 4.8 oz (77.7 kg)-176 lb 5.9 oz (80 kg)] 176 lb 5.9 oz (80 kg) (08/06 0407) Last BM Date: 11/17/17  Weight change: Filed Weights   11/17/17 1100 11/17/17 2005 11/18/17 0407  Weight: 171 lb 4.8 oz (77.7 kg) 174 lb 2.6 oz (79 kg) 176 lb 5.9 oz (80 kg)    Intake/Output:   Intake/Output Summary (Last 24 hours) at 11/18/2017 0851 Last data filed at 11/18/2017 0700 Gross per 24 hour  Intake 895.75 ml  Output 175 ml  Net 720.75 ml      Physical Exam    CVP 10 General:  No resp difficulty. HEENT: Normal Neck: Supple. JVP 10. Carotids 2+ bilat; no bruits. No thyromegaly or nodule noted. Cor: PMI nondisplaced. RRR, No M/G/R noted Lungs: CTAB, normal effort. Abdomen: Soft, non-tender, non-distended, no HSM. No bruits or  masses. +BS  Extremities: No cyanosis, clubbing, or rash. R and LLE no edema. RUE PICC CDI Neuro: Alert & orientedx3, cranial nerves grossly intact. moves all 4 extremities w/o difficulty. Affect pleasant   Telemetry   NSR 50-60s. Personally reviewed.   EKG    11/17/17: Aflutter 89 bpm with RBBB. Personally reviewed.   Labs    CBC Recent Labs    11/17/17 0436  WBC 13.8*  HGB 11.8*  HCT 38.1*  MCV 89.4  PLT 774   Basic Metabolic Panel Recent Labs    11/16/17 0421 11/17/17 0436 11/18/17 0409  NA 138 139 134*  K 3.6 3.8 4.3  CL 92* 93* 89*  CO2 35* 34* 32  GLUCOSE 122* 135* 180*  BUN 33* 36* 53*  CREATININE 1.78* 1.79* 2.75*  CALCIUM 9.0 9.0 8.6*  MG 2.0  --   --    Liver Function Tests No results for input(s): AST, ALT, ALKPHOS, BILITOT, PROT, ALBUMIN in the last 72 hours. No results for input(s): LIPASE, AMYLASE in the last 72 hours. Cardiac Enzymes No results for input(s): CKTOTAL, CKMB, CKMBINDEX, TROPONINI in the last 72 hours.  BNP: BNP (last 3 results) Recent Labs    06/25/17 1207 09/15/17 1401 11/05/17 1310  BNP 420.9* 393.0* 515.3*    ProBNP (last 3 results) No results  for input(s): PROBNP in the last 8760 hours.   D-Dimer No results for input(s): DDIMER in the last 72 hours. Hemoglobin A1C No results for input(s): HGBA1C in the last 72 hours. Fasting Lipid Panel No results for input(s): CHOL, HDL, LDLCALC, TRIG, CHOLHDL, LDLDIRECT in the last 72 hours. Thyroid Function Tests No results for input(s): TSH, T4TOTAL, T3FREE, THYROIDAB in the last 72 hours.  Invalid input(s): FREET3  Other results:   Imaging    Dg Chest 1 View  Result Date: 11/17/2017 CLINICAL DATA:  77 year old male with a history of fever EXAM: CHEST  1 VIEW COMPARISON:  11/11/2017, 11/05/2017 FINDINGS: Cardiomediastinal silhouette unchanged. Calcifications of the aortic arch. Unchanged right upper extremity PICC which appears to terminate superior vena cava. Improving  aeration at the lung bases with improved visualization of the right hemidiaphragm and the right heart border. Persistent opacity at the left base partially obscuring the left hemidiaphragm with blunting of left costophrenic angle. No pneumothorax. IMPRESSION: Improving aeration of the lungs, with near complete resolution of right pleural effusion, with residual trace left pleural effusion. Unchanged right upper extremity PICC Electronically Signed   By: Corrie Mckusick D.O.   On: 11/17/2017 15:51   US Abdomen Limited Ruq  Result Date: 11/18/2017 CLINICAL DATA:  Acute onset of vomiting. EXAM: ULTRASOUND ABDOMEN LIMITED RIGHT UPPER QUADRANT COMPARISON:  Right upper quadrant ultrasound performed 08/01/2017 FINDINGS: Gallbladder: Status post cholecystectomy.  No retained stones seen. Common bile duct: Diameter: 0.2 cm, within normal limits in caliber. Liver: No focal lesion identified. Within normal limits in parenchymal echogenicity. Portal vein is patent on color Doppler imaging with normal direction of blood flow towards the liver. A small right-sided pleural effusion is noted. IMPRESSION: 1. Status post cholecystectomy.  No retained stones seen. 2. Small right-sided pleural effusion noted. Electronically Signed   By: Garald Balding M.D.   On: 11/18/2017 03:56     Medications:     Scheduled Medications: . amiodarone  400 mg Oral BID  . apixaban  5 mg Oral BID  . bisacodyl  10 mg Oral Daily  . dronabinol  2.5 mg Oral BID AC  . famciclovir  250 mg Oral Daily  . feeding supplement (ENSURE ENLIVE)  237 mL Oral BID BM  . furosemide  80 mg Intravenous Once  . gabapentin  800 mg Oral QHS  . hydrocortisone   Rectal TID  . insulin aspart  0-15 Units Subcutaneous TID WC  . insulin aspart  0-5 Units Subcutaneous QHS  . insulin aspart  5 Units Subcutaneous TID WC  . LORazepam  1 mg Oral Once  . magnesium oxide  400 mg Oral Daily  . Melatonin  6 mg Oral QHS  . pantoprazole  40 mg Oral Daily  .  rosuvastatin  10 mg Oral Daily  . sertraline  25 mg Oral Daily  . simethicone  80 mg Oral QID  . sodium chloride flush  10-40 mL Intracatheter Q12H  . sodium chloride flush  3 mL Intravenous Q12H  . sorbitol  30 mL Oral Once  . spironolactone  25 mg Oral Daily  . Tafamidis Meglumine (Cardiac)  4 capsule Oral Daily  . traZODone  50 mg Oral QHS    Infusions: . sodium chloride 7 mL/hr at 11/14/17 0722  . ceFEPime (MAXIPIME) IV Stopped (11/17/17 1635)  . norepinephrine (LEVOPHED) Adult infusion 3 mcg/min (11/18/17 0700)  . vancomycin Stopped (11/17/17 1349)    PRN Medications: sodium chloride, acetaminophen, LORazepam, morphine injection, ondansetron (ZOFRAN) IV,  simethicone, sodium chloride flush, sodium chloride flush, zolpidem    Patient Profile   Dillon Andrews is a 77 y.o. male with a hx of chronic combined CHF due to NICM, CKD III,presumedNASH(per pt, never proven),esophageal varices per chart(prior glass of wine occasionally but no heavy alcohol),HTN, HLD, DM, Barrett's esophagus, GERD, anemia, RBBB, TTR amyloidosis, and Kappa Light Chain myeloma.  Admitted to [facility] 11/05/17 in setting of new Afib with RVR.   Assessment/Plan   1. Acute on chronic combined HF with TTR amyloidosis (Wild Type) - NICM by cath at [facility] 09/2016 with EF 40-45%. Echo 06/2017: EF 25-30%, mild LAE, mild/mod RV dilation, mod/severe RA. - PYP scan strongly suggestive of TTR amyloid, SPEP with 0.8% m-spike, UPEP with Bence Jones Protein positive, kappa type. 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 - Dr Marin Olp saw and thinks he does not have AL amyloid (just multiple myeloma) and that he has TTR amyloid and should be treated with tafamidis before chemo. Tafamidis started on 8/2.   - Volume status stable. CVP 10. Hold diuretics today with AKI - Co-ox 71% off milrinone (stopped 8/3).   - Hold spiro.  - No ARB/entresto with for now with CKD  2. Sepsis - SBP 90s this am. Afebrile. Remains on Vanc  and Cefepine. - Blood cultures still pending. UA negative.  - DC PICC line once weaned off norepi.   3. New onset AFlutter  - likely triggered HF decompensation  - s/p TEE DCCV 7/29.   - Back in atrial flutter 7/31, repeat DCCV on 8/2. Converted back to aflutter 8/5. Back in NSR today.  - Continue PO amiodarone and Eliquis.   4. AKI on CKD III, baseline ~1.6 -Creatinine stable 1.69 > 1.77 > 1.93 > 1.76 > 1.76 > 1.78 > 1.79 > 2.75. Likely in setting of hypotension with sepsis. Hold diuretics and spiro today.   5. Recurrent bilateral effusions - Somewhat increased from March on CXR 11/05/17. Follow with diuresis. No change.   6. [place] with Dr. Marin Olp who has seen this admission.  - Started on Bortezomib7/18/18andRevlimid7/23/19. - Continue management per primary team. - Dr Marin Olp thinks he does not have AL amyloid and that he does have TTR amyloid and should be treated with tafamodis before chemo. Now on Tafamodis. No change.   7. NASH with esophageal varices.  - Watch closely for bleeding with AC. Had some BRBPR with straining and bleeding at PICC site. Hemoglobin has been stable. CBC pending this am.  - Last EGD 06/21/2014 at [facility] for surveillance of Esophageal varices, Barrett's esophagus, and PMHx of Colon polyps. Showed two columns of small esophageal varices w/out stigmata, short segment Barrett's esophagus (without biopsies, due to presence of EVs), 5cm hiatal hernia, and hyperplastic small gastric nodules.   8. DM2 - Per primary. No change  9. Hypokalemia. - K 4.3 today.    11. Constipation/Abdominal pain - KUB 8/5 negative for obstruction - Continue dulcolax daily. - Abdominal pain resolved.  - Abdominal US 8/5 negative for acute findings  Length of Stay: [place], NP  8:51 AM  Advanced Heart Failure Team Pager [phone] (M-F; 7a - 4p)  Please contact [facility] for night-coverage after hours (4p -7a ) and  weekends on [email or web address]   Agree with above. Patient with low grade fevers and leukocytosis yesterday. Last night developed severe hypotension, Moved to ICU and started on NE and broad spectrum abx for presumed sepsis. Feels better this  am. PCT up to 9 confirming infectious process. Bcx pending. CVP 10. Creatinine up.   On exam  Weak appearing NAD JVP to jaw Cor RRR  Lungs CTA Ext warm no edema. RUE PICC ok  He developed sepsis last night and moved to ICU. Hemodynamically improved with NE and broad spectrum abx. Suspect line sepsis due to PICC. Will await cx. Will need to remove PICC soon but would prefer to get off NE first if possible. Depending on organism may need repeat TEE. Co-ox currently ok. Has AKI. Would hold lasix today. Hopefull AKI will turn around soon. Keep in ICU today.  CRITICAL CARE Performed by: Glori Bickers  Total critical care time: 35 minutes  Critical care time was exclusive of separately billable procedures and treating other patients.  Critical care was necessary to treat or prevent imminent or life-threatening deterioration.  Critical care was time spent personally by me (independent of midlevel providers or residents) on the following activities: development of treatment plan with patient and/or surrogate as well as nursing, discussions with consultants, evaluation of patient's response to treatment, examination of patient, obtaining history from patient or surrogate, ordering and performing treatments and interventions, ordering and review of laboratory studies, ordering and review of radiographic studies, pulse oximetry and re-evaluation of patient's condition.  Glori Bickers, MD  9:26 AM

## 2017-11-18 NOTE — Progress Notes (Signed)
Benefit check sent for Eliquis

## 2017-11-18 NOTE — Care Management (Signed)
11-18-17  BENEFITS CHECK: S/W  ALEX  @ [employer] RX # [phone]  ELIQUIS  5 MG BID COVER- YES CO-PAY- $ 23.34 TIER- 3 DRUG PRIOR APPROVAL- NO  PREFERRED PHARMACY: YES  [facility]

## 2017-11-19 ENCOUNTER — Encounter (HOSPITAL_COMMUNITY): Payer: Medicare HMO

## 2017-11-19 DIAGNOSIS — R140 Abdominal distension (gaseous): Secondary | ICD-10-CM

## 2017-11-19 DIAGNOSIS — R570 Cardiogenic shock: Secondary | ICD-10-CM

## 2017-11-19 LAB — CBC
HCT: 36.2 % — ABNORMAL LOW (ref 39.0–52.0)
HEMOGLOBIN: 11.2 g/dL — AB (ref 13.0–17.0)
MCH: 27.9 pg (ref 26.0–34.0)
MCHC: 30.9 g/dL (ref 30.0–36.0)
MCV: 90 fL (ref 78.0–100.0)
Platelets: 109 10*3/uL — ABNORMAL LOW (ref 150–400)
RBC: 4.02 MIL/uL — ABNORMAL LOW (ref 4.22–5.81)
RDW: 18.5 % — AB (ref 11.5–15.5)
WBC: 6.3 10*3/uL (ref 4.0–10.5)

## 2017-11-19 LAB — BASIC METABOLIC PANEL
ANION GAP: 13 (ref 5–15)
BUN: 71 mg/dL — ABNORMAL HIGH (ref 8–23)
CALCIUM: 8.6 mg/dL — AB (ref 8.9–10.3)
CO2: 32 mmol/L (ref 22–32)
CREATININE: 2.68 mg/dL — AB (ref 0.61–1.24)
Chloride: 89 mmol/L — ABNORMAL LOW (ref 98–111)
GFR calc non Af Amer: 21 mL/min — ABNORMAL LOW (ref >60)
GFR, EST AFRICAN AMERICAN: 25 mL/min — AB (ref >60)
Glucose, Bld: 129 mg/dL — ABNORMAL HIGH (ref 70–99)
Potassium: 4 mmol/L (ref 3.5–5.1)
SODIUM: 134 mmol/L — AB (ref 135–145)

## 2017-11-19 LAB — HEPATIC FUNCTION PANEL
ALT: 39 U/L (ref 0–44)
AST: 37 U/L (ref 15–41)
Albumin: 2.7 g/dL — ABNORMAL LOW (ref 3.5–5.0)
Alkaline Phosphatase: 206 U/L — ABNORMAL HIGH (ref 38–126)
BILIRUBIN INDIRECT: 1.1 mg/dL — AB (ref 0.3–0.9)
BILIRUBIN TOTAL: 1.7 mg/dL — AB (ref 0.3–1.2)
Bilirubin, Direct: 0.6 mg/dL — ABNORMAL HIGH (ref 0.0–0.2)
Total Protein: 6 g/dL — ABNORMAL LOW (ref 6.5–8.1)

## 2017-11-19 LAB — COOXEMETRY PANEL
CARBOXYHEMOGLOBIN: 1.3 % (ref 0.5–1.5)
Carboxyhemoglobin: 1.1 % (ref 0.5–1.5)
METHEMOGLOBIN: 1.4 % (ref 0.0–1.5)
METHEMOGLOBIN: 0.9 % (ref 0.0–1.5)
O2 SAT: 51.8 %
O2 Saturation: 41.5 %
TOTAL HEMOGLOBIN: 11.4 g/dL — AB (ref 12.0–16.0)
Total hemoglobin: 11.7 g/dL — ABNORMAL LOW (ref 12.0–16.0)

## 2017-11-19 LAB — AMMONIA: AMMONIA: 38 umol/L — AB (ref 9–35)

## 2017-11-19 LAB — VANCOMYCIN, TROUGH: VANCOMYCIN TR: 17 ug/mL (ref 15–20)

## 2017-11-19 LAB — GLUCOSE, CAPILLARY
GLUCOSE-CAPILLARY: 169 mg/dL — AB (ref 70–99)
Glucose-Capillary: 130 mg/dL — ABNORMAL HIGH (ref 70–99)
Glucose-Capillary: 154 mg/dL — ABNORMAL HIGH (ref 70–99)

## 2017-11-19 LAB — PROCALCITONIN: PROCALCITONIN: 7.8 ng/mL

## 2017-11-19 LAB — T4, FREE: FREE T4: 0.94 ng/dL (ref 0.82–1.77)

## 2017-11-19 LAB — TSH: TSH: 1.381 u[IU]/mL (ref 0.350–4.500)

## 2017-11-19 MED ORDER — MILRINONE LACTATE IN DEXTROSE 20-5 MG/100ML-% IV SOLN
0.1250 ug/kg/min | INTRAVENOUS | Status: DC
  Administered 2017-11-19 – 2017-11-26 (×7): 0.125 ug/kg/min via INTRAVENOUS
  Filled 2017-11-19 (×7): qty 100

## 2017-11-19 MED ORDER — AMIODARONE HCL 200 MG PO TABS
200.0000 mg | ORAL_TABLET | Freq: Two times a day (BID) | ORAL | Status: DC
  Administered 2017-11-19 – 2017-11-27 (×17): 200 mg via ORAL
  Filled 2017-11-19 (×17): qty 1

## 2017-11-19 MED ORDER — VANCOMYCIN HCL IN DEXTROSE 1-5 GM/200ML-% IV SOLN
1000.0000 mg | INTRAVENOUS | Status: DC
  Administered 2017-11-19 – 2017-11-20 (×2): 1000 mg via INTRAVENOUS
  Filled 2017-11-19 (×3): qty 200

## 2017-11-19 MED ORDER — SORBITOL 70 % SOLN
30.0000 mL | Freq: Once | Status: AC
  Administered 2017-11-19: 30 mL via ORAL
  Filled 2017-11-19: qty 30

## 2017-11-19 MED ORDER — DOCUSATE SODIUM 100 MG PO CAPS
100.0000 mg | ORAL_CAPSULE | Freq: Two times a day (BID) | ORAL | Status: DC
  Administered 2017-11-19 – 2017-11-20 (×4): 100 mg via ORAL
  Filled 2017-11-19 (×4): qty 1

## 2017-11-19 NOTE — Progress Notes (Signed)
Patient ID: Dillon Andrews, male   DOB: 1941/04/02, 77 y.o.   MRN: [id number]  This NP visited patient at the bedside as a follow up for palliative medicine needs and emotional support.  Introduced myself to Dillon Andrews offering support in exploring his current medical situation; treatment option decisions, advanced directive decisions and anticipatory care needs.  He shared with me a brief life review; his work, his family and overall appreciation for life.  Having been in excellent health until recently this dramatic decline in overall physical and functional decline is very difficult.  He tells me he really has never "thought much about" advanced directives or EOL wishes.  He realizes the seriousness of his situation and expresses appreciation for today discussion.  Discussed with Dillon Andrews  the importance of continued conversation with his  family and his medical providers regarding overall plan of care  ensuring decisions are within the context of the patients values and GOCs.  PMT will continue to support holistically  Questions and concerns addressed   Discussed with Dillon Grinder NP     Total time spent on the unit was 35 minutes  Greater than 50% of the time was spent in counseling and coordination of care  Wadie Lessen NP  Palliative Medicine Team Team Phone # [phone] Pager [phone]

## 2017-11-19 NOTE — Progress Notes (Addendum)
Patient ID: OZ GAMMEL, male   DOB: [birth year], 77 y.o.   MRN: [id number]     Advanced Heart Failure Rounding Note  PCP-Cardiologist: Glenetta Hew, MD   Subjective:   Wilburn Mylar norepi stopped and diuretics held with worsening renal function. . Todays CO-OX is 52%.  CVP 11.   Maintaining NSR. On po amio.   He has started tafamidis.   Complaining of nausea and poor appetite. Very weak. SOB with minimal activity.     Objective:   Weight Range: 174 lb 9.7 oz (79.2 kg) Body mass index is 25.78 kg/m.   Vital Signs:   Temp:  [97.5 F (36.4 C)-97.8 F (36.6 C)] 97.8 F (36.6 C) (08/07 0724) Pulse Rate:  [50-68] 59 (08/07 0700) Resp:  [9-28] 14 (08/07 0700) BP: (91-106)/(55-85) 100/68 (08/07 0700) SpO2:  [90 %-99 %] 95 % (08/07 0700) Weight:  [174 lb 9.7 oz (79.2 kg)] 174 lb 9.7 oz (79.2 kg) (08/07 0500) Last BM Date: 11/17/17  Weight change: Filed Weights   11/17/17 2005 11/18/17 0407 11/19/17 0500  Weight: 174 lb 2.6 oz (79 kg) 176 lb 5.9 oz (80 kg) 174 lb 9.7 oz (79.2 kg)    Intake/Output:   Intake/Output Summary (Last 24 hours) at 11/19/2017 0759 Last data filed at 11/19/2017 0700 Gross per 24 hour  Intake 1181.48 ml  Output 625 ml  Net 556.48 ml      Physical Exam    CVP 10  General:  Well appearing. No resp difficulty HEENT: normal Neck: supple. no JVD. Carotids 2+ bilat; no bruits. No lymphadenopathy or thryomegaly appreciated. Cor: PMI nondisplaced. Regular rate & rhythm. No rubs, gallops or murmurs. Lungs: clear Abdomen: soft, nontender, nondistended. No hepatosplenomegaly. No bruits or masses. Good bowel sounds. Extremities: no cyanosis, clubbing, rash, edema. RUE PICC  Neuro: alert & orientedx3, cranial nerves grossly intact. moves all 4 extremities w/o difficulty. Affect pleasant   Telemetry   NSR 60s personally reviewed.   EKG    11/17/17: Aflutter 89 bpm with RBBB. Personally reviewed.   Labs    CBC Recent Labs    11/18/17 1113  11/19/17 0401  WBC 11.9* 6.3  HGB 11.6* 11.2*  HCT 37.6* 36.2*  MCV 90.4 90.0  PLT 130* 412*   Basic Metabolic Panel Recent Labs    11/18/17 0409 11/19/17 0401  NA 134* 134*  K 4.3 4.0  CL 89* 89*  CO2 32 32  GLUCOSE 180* 129*  BUN 53* 71*  CREATININE 2.75* 2.68*  CALCIUM 8.6* 8.6*   Liver Function Tests No results for input(s): AST, ALT, ALKPHOS, BILITOT, PROT, ALBUMIN in the last 72 hours. No results for input(s): LIPASE, AMYLASE in the last 72 hours. Cardiac Enzymes No results for input(s): CKTOTAL, CKMB, CKMBINDEX, TROPONINI in the last 72 hours.  BNP: BNP (last 3 results) Recent Labs    06/25/17 1207 09/15/17 1401 11/05/17 1310  BNP 420.9* 393.0* 515.3*    ProBNP (last 3 results) No results for input(s): PROBNP in the last 8760 hours.   D-Dimer No results for input(s): DDIMER in the last 72 hours. Hemoglobin A1C No results for input(s): HGBA1C in the last 72 hours. Fasting Lipid Panel No results for input(s): CHOL, HDL, LDLCALC, TRIG, CHOLHDL, LDLDIRECT in the last 72 hours. Thyroid Function Tests No results for input(s): TSH, T4TOTAL, T3FREE, THYROIDAB in the last 72 hours.  Invalid input(s): FREET3  Other results:   Imaging    No results found.   Medications:     Scheduled  Medications: . amiodarone  400 mg Oral BID  . apixaban  5 mg Oral BID  . bisacodyl  10 mg Oral Daily  . Chlorhexidine Gluconate Cloth  6 each Topical Daily  . dronabinol  2.5 mg Oral BID AC  . famciclovir  250 mg Oral Daily  . feeding supplement (ENSURE ENLIVE)  237 mL Oral BID BM  . gabapentin  800 mg Oral QHS  . hydrocortisone   Rectal TID  . insulin aspart  0-15 Units Subcutaneous TID WC  . insulin aspart  0-5 Units Subcutaneous QHS  . insulin aspart  5 Units Subcutaneous TID WC  . LORazepam  1 mg Oral Once  . magnesium oxide  400 mg Oral Daily  . Melatonin  6 mg Oral QHS  . pantoprazole  40 mg Oral Daily  . rosuvastatin  10 mg Oral Daily  . sertraline  25  mg Oral Daily  . simethicone  80 mg Oral QID  . sodium chloride flush  10-40 mL Intracatheter Q12H  . sodium chloride flush  3 mL Intravenous Q12H  . sorbitol  30 mL Oral Once  . Tafamidis Meglumine (Cardiac)  4 capsule Oral Daily  . traZODone  50 mg Oral QHS    Infusions: . sodium chloride 10 mL/hr at 11/19/17 0700  . ceFEPime (MAXIPIME) IV Stopped (11/18/17 1641)  . norepinephrine (LEVOPHED) Adult infusion Stopped (11/18/17 1326)  . vancomycin Stopped (11/18/17 1232)    PRN Medications: sodium chloride, acetaminophen, LORazepam, morphine injection, ondansetron (ZOFRAN) IV, simethicone, sodium chloride flush, sodium chloride flush    Patient Profile   FLYNN GWYN is a 77 y.o. male with a hx of chronic combined CHF due to NICM, CKD III,presumedNASH(per pt, never proven),esophageal varices per chart(prior glass of wine occasionally but no heavy alcohol),HTN, HLD, DM, Barrett's esophagus, GERD, anemia, RBBB, TTR amyloidosis, and Kappa Light Chain myeloma.  Admitted to [facility] 11/05/17 in setting of new Afib with RVR.   Assessment/Plan   1. Acute on chronic combined HF with TTR amyloidosis (Wild Type) - NICM by cath at [facility] 09/2016 with EF 40-45%. Echo 06/2017: EF 25-30%, mild LAE, mild/mod RV dilation, mod/severe RA. - PYP scan strongly suggestive of TTR amyloid, SPEP with 0.8% m-spike, UPEP with Bence Jones Protein positive, kappa type. 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 - Dr Marin Olp saw and thinks he does not have AL amyloid (just multiple myeloma) and that he has TTR amyloid and should be treated with tafamidis before chemo. Tafamidis started on 8/2.   Off milrinone 8/3. Off Norep 8/6. - CVP ~10. Creatinine 2.7>2.68 -. Continue to hold diuretics.  - CO-OX 52%. Repeat CO-OX now. Nausea may be from low ouput. May need to restart milrinone.  - Hold spiro.  - No ARB/entresto with for now with CKD -SBP soft. Hold off on hydralazine/imdur  2. Sepsis - Afebrile.  WBC trending down.  - Remains on Vanc and Cefepine. - 8/5 Blood culture- NGTD  3. New onset AFlutter  - likely triggered HF decompensation  - s/p TEE DCCV 7/29.   - Back in atrial flutter 7/31, repeat DCCV on 8/2. Converted back to aflutter 8/5. -Maintaining NSR - Cut back amio to 200 mg twice a day - Continue eliquis 2.5 mg twice a day   4. AKI on CKD III, baseline ~1.6 -Creatinine trending up 1.8>2.75>2.68  -Holding diuretics.   5. Recurrent bilateral effusions - Somewhat increased from March on CXR 11/05/17.   6. Kappa Lightchain Myeloma - Follows  with Dr. Marin Olp who has seen this admission.  - Started on Bortezomib7/18/18andRevlimid7/23/19. - Continue management per primary team. - Dr Marin Olp thinks he does not have AL amyloid and that he does have TTR amyloid and should be treated with tafamodis before chemo. Now on Tafamodis. No change.   7. NASH with esophageal varices.  - Watch closely for bleeding with AC. Had some BRBPR with straining and bleeding at PICC site. Hemoglobin has been stable. CBC pending this am.  - Last EGD 06/21/2014 at [facility] for surveillance of Esophageal varices, Barrett's esophagus, and PMHx of Colon polyps. Showed two columns of small esophageal varices w/out stigmata, short segment Barrett's esophagus (without biopsies, due to presence of EVs), 5cm hiatal hernia, and hyperplastic small gastric nodules.   8. DM2 - Per primary. No change  9. Hypokalemia. - Stable.   11. Constipation/Abdominal pain - Nausea this morning ? Low output. KUB 8/5 negative for obstruction - Continue dulcolax daily. - Abdominal pain resolved.  - Abdominal US 8/5 negative for acute findings  Length of Stay: [place], NP  7:59 AM  Advanced Heart Failure Team Pager [phone] (M-F; 7a - 4p)  Please contact [facility] for night-coverage after hours (4p -7a ) and weekends on [email or web address]  Repeat CO-OX 41.5%  Start milrinone 0.125 mcg.   Amy Clegg  NP-C  9:48 AM  Agree with above.   Feels very weak today. Co-ox markedly depressed c/w cardiogenic shock. BCX remain NGTD x 2 days. Has poor appetite and is nauseated. WBC now down.   On exam sitting up in bed. Weak NAD JVP to jaw Cor RRR +s3 Lungs clear Ab soft NT Ext cool no edema  He is recovering from sepsis but now with recurrent biventricular failure and cardiogenic shock due to cardiac amyloidosis. Agree with restarting milrinone. Will likely need home inotropes, however with recent infection would like at least 48 hour line holiday. Will d.w family tomorrow. Maintaining NSR for now but at high risk for recurrent AFL with restarting milrinone. Overall prognosis quite poor.   Glori Bickers, MD  5:58 PM

## 2017-11-19 NOTE — Progress Notes (Signed)
Pharmacy Antibiotic Note  Dillon Andrews is a 77 y.o. male admitted on 11/05/2017 with AoCHF.  Pharmacy has been consulted for vancomycin dosing with elevated WBC and concern for PICC line infection. SCr has remained stable ~1.7, pt afebrile, MRSA screen negative.  Currently afebrile, wbc down to normal, scr elevated but stable overnight at 2.68.   Vancomycin trough this am was at goal at 17. Continue current regimen for now. Cultures are no growth.   Plan: -Vancomycin 105m IV q24h -Follow cultures, renal function -Cefepime 1g q24  Height: 5' 9"  (175.3 cm) Weight: 174 lb 9.7 oz (79.2 kg) IBW/kg (Calculated) : 70.7  Temp (24hrs), Avg:97.7 F (36.5 C), Min:97.5 F (36.4 C), Max:97.8 F (36.6 C)  Recent Labs  Lab 11/14/17 0500 11/15/17 0406 11/16/17 0421 11/17/17 0436 11/17/17 2016 11/18/17 0409 11/18/17 1113 11/18/17 1602 11/19/17 0401  WBC 10.9*  --   --  13.8*  --   --  11.9*  --  6.3  CREATININE 1.76* 1.76* 1.78* 1.79*  --  2.75*  --   --  2.68*  LATICACIDVEN  --   --   --   --  2.1*  --   --  2.2*  --     Estimated Creatinine Clearance: 22.7 mL/min (A) (by C-G formula based on SCr of 2.68 mg/dL (H)).    No Known Allergies  Antimicrobials this admission: Vancomycin 8/5 >> Cefepime 8/5>>  Dose adjustments this admission: 8/7 VT 17 - continue current dose  Microbiology results: 7/24 MRSA PCR: negative 8/5 bld -ngtd  Thank you for allowing pharmacy to be a part of this patient's care.  FErin HearingPharmD., BCPS Clinical Pharmacist 11/19/2017 9:48 AM

## 2017-11-19 NOTE — Progress Notes (Addendum)
PROGRESS NOTE    Dillon Andrews  QHU:[id number] DOB: Oct 30, 1940 DOA: 11/05/2017 PCP: [name], MD   Brief Narrative77 year old male with past medical history of liver cirrhosis with esophageal varices, combined systolic and diastolic CHF with ejection fraction of 25 to 30%, nonischemic cardiomyopathy, pulmonary hypertension, cardiac amyloidosis, CKD stage III, kappa light chain myeloma on chemotherapy was brought to the emergency department due to shortness of breath. Patient was found to be in A. fib with RVR with hypotension on presentation. Cardiology was consulted. Started on amiodarone drip. Also started on heparin for new onset A. fib. CHF team  Consulted , cardioversion on 7/29.    11/18/2017 patient was transferred to [facility] for close monitoring since he became hypotensive and had episode of bradycardia.  He was started on NOR EPI with improvement in his blood pressure.  lactic acid level 2.1 procalcitonin 0.44 up to 9.48.  White count prior to transfer was 13.8 down to 11.9 today patient was started empirically on Vanco and cefepime.  Cardiology  Stopped nor epi and held diuretics because of worsening renal function  Assessment & Plan:   Principal Problem:   Atrial fibrillation with RVR (HCC) Active Problems:   CKD (chronic kidney disease) stage 3, GFR 30-59 ml/min (HCC), baseline Cr 1.5   DM2 (diabetes mellitus, type 2) (HCC)   Acute on chronic combined systolic and diastolic CHF (congestive heart failure) (HCC)   Bilateral pleural effusion   Esophageal varices in cirrhosis (HCC)   SOB (shortness of breath)   Non-ischemic cardiomyopathy ([place])   Other secondary pulmonary hypertension (HCC)   RBBB   Cardiac amyloidosis (HCC)   Kappa light chain myeloma (HCC)   Leukocytosis   Elevated troponin   Elevated brain natriuretic peptide (BNP) level, acute on chronic   Hypotension   Palliative care by specialist   Acute on chronic combined systolic and diastolic  CHF, amyloidosis -2D echo 06/2017 showed EF of 25 to 30% with mild LAE, mild to moderate RV dilation moderate to severe RA. PYP scan suggestive of TTR amyloid. , patient is followed by Dr Marin Olp saw and thinks he does not have AL amyloid (just multiple myeloma) and that he has TTR amyloid and should be treated with tafamidis before chemo Tafamidis started on 8/2.   Off milrinone 8/3. Off Norep 8/6. CVP ~10. Creatinine 2.7>2.68 Continue to hold diuretics.  Diuretics as per cardiology -CHF team following , cardiology holding Aldactone, ARB/entresto , hydralazine/Imdur      Acute new onset atrial fibrillation with RVR -Status post TEE DCCV on 7/29, briefly returned to normal sinus rhythm  Back in atrial flutter 7/31, repeat DCCV on 8/2. Converted back to aflutter 8/5. CHADS VASC 6, continue Eliquis -IV amiodarone transitioned to oral, per EP not an ablation candidate    Liver cirrhosis with esophageal varices -Currently no signs of bleeding, monitor closely on anticoagulation -Last EGD 06/21/2014 at [facility] showed esophageal varices, Barrett's esophagus, likely [name]. -Follow outpatient with gastroenterology, continue PPI  Kappa light chain myeloma -Follows Dr. Marin Olp, just had chemotherapy few days ago, was started on bortezomib on 10/20/2016 and Revlimid on 11/04/2017 -Dr. Marin Olp following  Now on Tafamodis. No change.     acute on CKD stage III -Baseline around 1.6 -Creatinine 2.75>2.68  today multifactorial with hypotension diuretics of spironolactone and possible sepsis.  Follow daily.   Diabetes mellitus type 2-waiting to hear from family if he was taking anything at home.HBAIC 7.5.Continue ssi for now.family reports he used to take glipizide  2.5 mg bid in the past which his pcp had stopped.consider restarting once po intake adequate.  Sepsis unclear etiology rule out PICC line infection.  Blood cultures no growth so far.  PICC line to come out once he is done with norepi.   Leukocytosis improved.  Hypotension improved.  Right upper quadrant ultrasound no evidence of gallstones.  Continue empiric antibiotics for now.  Chest x-ray shows near complete resolution of right effusion and residual trace left effusion with improved aeration of the lungs.  UA is unremarkable.   DVT prophylaxis: Apixaban Code Status: Full code Family Communication: None Disposition Plan: Per cardiology    Consultants: CHF team  Procedures: TEE and cardioversion 11/10/2017 patient had repeat cardioversion 11/14/2017   Antimicrobials: none   subjective- , complaining of nausea, dizziness, blood pressure soft,  Objective: Vitals:   11/19/17 0700 11/19/17 0724 11/19/17 0800 11/19/17 0900  BP: 100/68  103/65 99/71  Pulse: (!) 59  63 61  Resp: 14  (!) 23 (!) 24  Temp:  97.8 F (36.6 C)    TempSrc:  Oral    SpO2: 95%  95% 96%  Weight:      Height:        Intake/Output Summary (Last 24 hours) at 11/19/2017 0949 Last data filed at 11/19/2017 0900 Gross per 24 hour  Intake 1158.99 ml  Output 800 ml  Net 358.99 ml   Filed Weights   11/17/17 2005 11/18/17 0407 11/19/17 0500  Weight: 79 kg (174 lb 2.6 oz) 80 kg (176 lb 5.9 oz) 79.2 kg (174 lb 9.7 oz)    Examination:  General exam: Appears calm and comfortable  Respiratory system: Decreased breath sounds at the bases to auscultation. Respiratory effort normal. Cardiovascular system: S1 & S2 heard, RRR. No JVD, murmurs, rubs, gallops or clicks. No pedal edema. Gastrointestinal system: Abdomen is nondistended, soft and nontender. No organomegaly or masses felt. Normal bowel sounds heard. Central nervous system: Alert and oriented. No focal neurological deficits. Extremities: Symmetric 5 x 5 power. Skin: No rashes, lesions or ulcers Psychiatry: Judgement and insight appear normal. Mood & affect appropriate.     Data Reviewed: I have personally reviewed following labs and imaging studies  CBC: Recent Labs  Lab 11/14/17 0500  11/17/17 0436 11/18/17 1113 11/19/17 0401  WBC 10.9* 13.8* 11.9* 6.3  HGB 12.2* 11.8* 11.6* 11.2*  HCT 38.8* 38.1* 37.6* 36.2*  MCV 88.4 89.4 90.4 90.0  PLT 188 153 130* 078*   Basic Metabolic Panel: Recent Labs  Lab 11/15/17 0406 11/16/17 0421 11/17/17 0436 11/18/17 0409 11/19/17 0401  NA 138 138 139 134* 134*  K 3.8 3.6 3.8 4.3 4.0  CL 94* 92* 93* 89* 89*  CO2 34* 35* 34* 32 32  GLUCOSE 135* 122* 135* 180* 129*  BUN 33* 33* 36* 53* 71*  CREATININE 1.76* 1.78* 1.79* 2.75* 2.68*  CALCIUM 8.7* 9.0 9.0 8.6* 8.6*  MG  --  2.0  --   --   --    GFR: Estimated Creatinine Clearance: 22.7 mL/min (A) (by C-G formula based on SCr of 2.68 mg/dL (H)). Liver Function Tests: No results for input(s): AST, ALT, ALKPHOS, BILITOT, PROT, ALBUMIN in the last 168 hours. No results for input(s): LIPASE, AMYLASE in the last 168 hours. No results for input(s): AMMONIA in the last 168 hours. Coagulation Profile: No results for input(s): INR, PROTIME in the last 168 hours. Cardiac Enzymes: No results for input(s): CKTOTAL, CKMB, CKMBINDEX, TROPONINI in the last 168 hours. BNP (last  3 results) No results for input(s): PROBNP in the last 8760 hours. HbA1C: No results for input(s): HGBA1C in the last 72 hours. CBG: Recent Labs  Lab 11/18/17 0628 11/18/17 1141 11/18/17 1620 11/18/17 2103 11/19/17 0704  GLUCAP 126* 294* 206* 125* 130*   Lipid Profile: No results for input(s): CHOL, HDL, LDLCALC, TRIG, CHOLHDL, LDLDIRECT in the last 72 hours. Thyroid Function Tests: No results for input(s): TSH, T4TOTAL, FREET4, T3FREE, THYROIDAB in the last 72 hours. Anemia Panel: No results for input(s): VITAMINB12, FOLATE, FERRITIN, TIBC, IRON, RETICCTPCT in the last 72 hours. Sepsis Labs: Recent Labs  Lab 11/17/17 1618 11/17/17 2016 11/18/17 0409 11/18/17 1602 11/19/17 0401  PROCALCITON 0.44  --  9.48  --  7.80  LATICACIDVEN  --  2.1*  --  2.2*  --     Recent Results (from the past 240  hour(s))  Culture, blood (Routine X 2) w Reflex to ID Panel     Status: None (Preliminary result)   Collection Time: 11/17/17 11:16 AM  Result Value Ref Range Status   Specimen Description BLOOD LEFT ANTECUBITAL  Final   Special Requests   Final    BOTTLES DRAWN AEROBIC AND ANAEROBIC Blood Culture adequate volume   Culture   Final    NO GROWTH 1 DAY Performed at [facility], [place] [address] [place], [place] [address]    Report Status PENDING  Incomplete  Culture, blood (Routine X 2) w Reflex to ID Panel     Status: None (Preliminary result)   Collection Time: 11/17/17 11:22 AM  Result Value Ref Range Status   Specimen Description BLOOD LEFT HAND  Final   Special Requests   Final    BOTTLES DRAWN AEROBIC AND ANAEROBIC Blood Culture adequate volume   Culture   Final    NO GROWTH 1 DAY Performed at [facility], [place] [address], [place], [place] [address]    Report Status PENDING  Incomplete  MRSA PCR Screening     Status: None   Collection Time: 11/17/17  8:27 PM  Result Value Ref Range Status   MRSA by PCR NEGATIVE NEGATIVE Final    Comment:        The GeneXpert MRSA Assay (FDA approved for NASAL specimens only), is one component of a comprehensive MRSA colonization surveillance program. It is not intended to diagnose MRSA infection nor to guide or monitor treatment for MRSA infections. Performed at [facility], [place] [address], [place],  [address]          Radiology Studies: Dg Chest 1 View  Result Date: 11/17/2017 CLINICAL DATA:  77 year old male with a history of fever EXAM: CHEST  1 VIEW COMPARISON:  11/11/2017, 11/05/2017 FINDINGS: Cardiomediastinal silhouette unchanged. Calcifications of the aortic arch. Unchanged right upper extremity PICC which appears to terminate superior vena cava. Improving aeration at the lung bases with improved visualization of the right hemidiaphragm and the right heart border. Persistent opacity at the left base  partially obscuring the left hemidiaphragm with blunting of left costophrenic angle. No pneumothorax. IMPRESSION: Improving aeration of the lungs, with near complete resolution of right pleural effusion, with residual trace left pleural effusion. Unchanged right upper extremity PICC Electronically Signed   By: Corrie Mckusick D.O.   On: 11/17/2017 15:51   US Abdomen Limited Ruq  Result Date: 11/18/2017 CLINICAL DATA:  Acute onset of vomiting. EXAM: ULTRASOUND ABDOMEN LIMITED RIGHT UPPER QUADRANT COMPARISON:  Right upper quadrant ultrasound performed 08/01/2017 FINDINGS: Gallbladder:  Status post cholecystectomy.  No retained stones seen. Common bile duct: Diameter: 0.2 cm, within normal limits in caliber. Liver: No focal lesion identified. Within normal limits in parenchymal echogenicity. Portal vein is patent on color Doppler imaging with normal direction of blood flow towards the liver. A small right-sided pleural effusion is noted. IMPRESSION: 1. Status post cholecystectomy.  No retained stones seen. 2. Small right-sided pleural effusion noted. Electronically Signed   By: Garald Balding M.D.   On: 11/18/2017 03:56        Scheduled Meds: . amiodarone  200 mg Oral BID  . apixaban  5 mg Oral BID  . bisacodyl  10 mg Oral Daily  . Chlorhexidine Gluconate Cloth  6 each Topical Daily  . docusate sodium  100 mg Oral BID  . dronabinol  2.5 mg Oral BID AC  . famciclovir  250 mg Oral Daily  . feeding supplement (ENSURE ENLIVE)  237 mL Oral BID BM  . gabapentin  800 mg Oral QHS  . hydrocortisone   Rectal TID  . insulin aspart  0-15 Units Subcutaneous TID WC  . insulin aspart  0-5 Units Subcutaneous QHS  . insulin aspart  5 Units Subcutaneous TID WC  . LORazepam  1 mg Oral Once  . magnesium oxide  400 mg Oral Daily  . Melatonin  6 mg Oral QHS  . pantoprazole  40 mg Oral Daily  . rosuvastatin  10 mg Oral Daily  . sertraline  25 mg Oral Daily  . simethicone  80 mg Oral QID  . sodium chloride flush   10-40 mL Intracatheter Q12H  . sodium chloride flush  3 mL Intravenous Q12H  . sorbitol  30 mL Oral Once  . Tafamidis Meglumine (Cardiac)  4 capsule Oral Daily  . traZODone  50 mg Oral QHS   Continuous Infusions: . sodium chloride 10 mL/hr at 11/19/17 0900  . ceFEPime (MAXIPIME) IV Stopped (11/18/17 1641)  . milrinone    . norepinephrine (LEVOPHED) Adult infusion Stopped (11/18/17 1326)  . vancomycin Stopped (11/18/17 1232)     LOS: 14 days     Reyne Dumas, MD [facility]  If 7PM-7AM, please contact night-coverage [email or web address] Password [facility] 11/19/2017, 9:49 AM

## 2017-11-20 ENCOUNTER — Inpatient Hospital Stay: Payer: Medicare HMO

## 2017-11-20 ENCOUNTER — Other Ambulatory Visit: Payer: Self-pay | Admitting: Hematology & Oncology

## 2017-11-20 DIAGNOSIS — Z7189 Other specified counseling: Secondary | ICD-10-CM

## 2017-11-20 DIAGNOSIS — C9000 Multiple myeloma not having achieved remission: Secondary | ICD-10-CM

## 2017-11-20 DIAGNOSIS — J029 Acute pharyngitis, unspecified: Secondary | ICD-10-CM

## 2017-11-20 DIAGNOSIS — R778 Other specified abnormalities of plasma proteins: Secondary | ICD-10-CM

## 2017-11-20 LAB — GLUCOSE, CAPILLARY
GLUCOSE-CAPILLARY: 170 mg/dL — AB (ref 70–99)
GLUCOSE-CAPILLARY: 141 mg/dL — AB (ref 70–99)
Glucose-Capillary: 129 mg/dL — ABNORMAL HIGH (ref 70–99)
Glucose-Capillary: 126 mg/dL — ABNORMAL HIGH (ref 70–99)
Glucose-Capillary: 178 mg/dL — ABNORMAL HIGH (ref 70–99)
Glucose-Capillary: 141 mg/dL — ABNORMAL HIGH (ref 70–99)

## 2017-11-20 LAB — CBC
HCT: 34 % — ABNORMAL LOW (ref 39.0–52.0)
HEMOGLOBIN: 10.5 g/dL — AB (ref 13.0–17.0)
MCH: 27.8 pg (ref 26.0–34.0)
MCHC: 30.9 g/dL (ref 30.0–36.0)
MCV: 89.9 fL (ref 78.0–100.0)
Platelets: 107 10*3/uL — ABNORMAL LOW (ref 150–400)
RBC: 3.78 MIL/uL — AB (ref 4.22–5.81)
RDW: 18.3 % — ABNORMAL HIGH (ref 11.5–15.5)
WBC: 6.6 10*3/uL (ref 4.0–10.5)

## 2017-11-20 LAB — COMPREHENSIVE METABOLIC PANEL
ALK PHOS: 190 U/L — AB (ref 38–126)
ALT: 36 U/L (ref 0–44)
AST: 33 U/L (ref 15–41)
Albumin: 2.6 g/dL — ABNORMAL LOW (ref 3.5–5.0)
Anion gap: 11 (ref 5–15)
BUN: 58 mg/dL — AB (ref 8–23)
CALCIUM: 8.7 mg/dL — AB (ref 8.9–10.3)
CO2: 34 mmol/L — AB (ref 22–32)
Chloride: 94 mmol/L — ABNORMAL LOW (ref 98–111)
Creatinine, Ser: 2.06 mg/dL — ABNORMAL HIGH (ref 0.61–1.24)
GFR calc non Af Amer: 29 mL/min — ABNORMAL LOW (ref >60)
GFR, EST AFRICAN AMERICAN: 34 mL/min — AB (ref >60)
GLUCOSE: 127 mg/dL — AB (ref 70–99)
Potassium: 3.6 mmol/L (ref 3.5–5.1)
SODIUM: 139 mmol/L (ref 135–145)
Total Bilirubin: 1.6 mg/dL — ABNORMAL HIGH (ref 0.3–1.2)
Total Protein: 5.8 g/dL — ABNORMAL LOW (ref 6.5–8.1)

## 2017-11-20 LAB — COOXEMETRY PANEL
CARBOXYHEMOGLOBIN: 1.5 % (ref 0.5–1.5)
Methemoglobin: 1.5 % (ref 0.0–1.5)
O2 Saturation: 65.5 %
Total hemoglobin: 10.7 g/dL — ABNORMAL LOW (ref 12.0–16.0)

## 2017-11-20 MED ORDER — ZOLPIDEM TARTRATE 5 MG PO TABS
5.0000 mg | ORAL_TABLET | Freq: Every evening | ORAL | Status: DC | PRN
  Administered 2017-11-20 – 2017-11-26 (×7): 5 mg via ORAL
  Filled 2017-11-20 (×7): qty 1

## 2017-11-20 MED ORDER — POTASSIUM CHLORIDE CRYS ER 20 MEQ PO TBCR
40.0000 meq | EXTENDED_RELEASE_TABLET | Freq: Once | ORAL | Status: AC
  Administered 2017-11-20: 40 meq via ORAL
  Filled 2017-11-20: qty 2

## 2017-11-20 NOTE — Progress Notes (Signed)
Pt has been offered bath multiple times today. Not feeling up to it. Declines offer. PICC has been removed, no current indication for CHG. Pt educated on need for daily bathing and understands, but requests to have bath tomorrow.

## 2017-11-20 NOTE — Progress Notes (Signed)
Physical Therapy Treatment Patient Details Name: Dillon Andrews MRN: [id number] DOB: 08-03-1940 Today's Date: 11/20/2017    History of Present Illness Pt is a 77 y/o male admitted 11/05/17 secondary to worsening SOB and chest tightness; found to have a fib with RVR. S/p TEE and cardioversion 7/29. 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 PMH inlcudes kappa light chain myeloma (currently undergoing chemotherapy), CHF, nonischemic cardiomyopathy, pulmonary HTN, CKD.     PT Comments    Pt pleasant and eager to walk with physical therapy. HR ranged from 71-75. Pt has two steps at home with no rails to get in, required moderate assist on descent with use of rail on right. Daughter and son claim they are able to install rail at house. Pt will require rail and assist for safety to return home.    Follow Up Recommendations  Home health PT;Supervision for mobility/OOB     Equipment Recommendations  None recommended by PT    Recommendations for Other Services       Precautions / Restrictions Precautions Precautions: Fall Precaution Comments: watch HR; target HR 65-105 bpm Restrictions Weight Bearing Restrictions: No    Mobility  Bed Mobility Overal bed mobility: Modified Independent Bed Mobility: Supine to Sit     Supine to sit: Modified independent (Device/Increase time)     General bed mobility comments: HOB elevated  Transfers Overall transfer level: Needs assistance Equipment used: Rolling walker (2 wheeled) Transfers: Sit to/from Stand Sit to Stand: Supervision         General transfer comment: supervision for safety and lines  Ambulation/Gait Ambulation/Gait assistance: Min assist Gait Distance (Feet): 340 Feet Assistive device: Rolling walker (2 wheeled) Gait Pattern/deviations: Step-through pattern;Decreased stride length;Trunk flexed Gait velocity: decreased  Gait velocity interpretation: >2.62 ft/sec, indicative of community  ambulatory General Gait Details: min guard for majority of gait with min a during turns to right for L lateral trunk lean, verbal cue for posture correction    Stairs Stairs: Yes Stairs assistance: Mod assist Stair Management: One rail Right Number of Stairs: 3 General stair comments: mod assist for trunk control on decent, significant forward lean with rail use on right. decreased eccentric control of R leg on descent. min guard on ascent with rail use on left. education to descend with weak leg and ascend with stronger leg.    Wheelchair Mobility    Modified Rankin (Stroke Patients Only)       Balance Overall balance assessment: Needs assistance Sitting-balance support: No upper extremity supported;Feet supported Sitting balance-Leahy Scale: Good     Standing balance support: No upper extremity supported Standing balance-Leahy Scale: Fair                              Cognition Arousal/Alertness: Awake/alert Behavior During Therapy: WFL for tasks assessed/performed Overall Cognitive Status: Within Functional Limits for tasks assessed                                        Exercises General Exercises - Lower Extremity Long Arc Quad: AROM;15 reps;Both(spasms on R leg with AROM extension) Hip Flexion/Marching: Both;15 reps;AROM    General Comments General comments (skin integrity, edema, etc.): pt's daughter present       Pertinent Vitals/Pain Pain Assessment: No/denies pain    Home Living  Prior Function            PT Goals (current goals can now be found in the care plan section) Progress towards PT goals: Progressing toward goals    Frequency    Min 3X/week      PT Plan Current plan remains appropriate    Co-evaluation              AM-PAC PT "6 Clicks" Daily Activity  Outcome Measure  Difficulty turning over in bed (including adjusting bedclothes, sheets and blankets)?:  None Difficulty moving from lying on back to sitting on the side of the bed? : None Difficulty sitting down on and standing up from a chair with arms (e.g., wheelchair, bedside commode, etc,.)?: A Little Help needed moving to and from a bed to chair (including a wheelchair)?: A Little Help needed walking in hospital room?: A Little Help needed climbing 3-5 steps with a railing? : A Little 6 Click Score: 20    End of Session Equipment Utilized During Treatment: Gait belt Activity Tolerance: Patient tolerated treatment well Patient left: in chair;with chair alarm set;with call bell/phone within reach;with family/visitor present(daughter ) Nurse Communication: Mobility status PT Visit Diagnosis: Unsteadiness on feet (R26.81);Muscle weakness (generalized) (M62.81);Other abnormalities of gait and mobility (R26.89)     Time: 8257-4935 PT Time Calculation (min) (ACUTE ONLY): 20 min  Charges:  $Gait Training: 8-22 mins                     Samuella Bruin, [place]  Acute Rehab [phone]    Samuella Bruin 11/20/2017, 12:28 PM

## 2017-11-20 NOTE — Progress Notes (Signed)
Patient ID: Dillon Andrews, male   DOB: 09-22-1940, 77 y.o.   MRN: [id number]  This NP visited patient at the bedside as a follow up to yesterdays's conversation and for palliative medicine needs and emotional support   Dillon Andrews continues to explore thoughts and feelings regarding  his current medical situation.  He understands the seriousness; he remains open to all offered and  avialble medical interventions to prolong life.    Dr Marin Olp has expressed to the patient that he will have to be cardaic stable before any further oncologic treatment could be considered   IV Milrinone was restarted yesterday and today he feel better.  We discussed the use of Milrinone to improve quality of life not quantity.  He is open to home Milrinone if it is an option.  Dillon Andrews asked me to speak with his daughter later in the  afternoon.  I meet with daughter Dillon Andrews.   Continued conversation with Dillon Andrews and her father regarding diagnosis, prognosis, GOCs, advanced directives and anticipatory care needs.   Patient would like to "see how things go" over the next few days and re-meet with this NP Monday afternoon with his children and wife.  Dillon Andrews will coordinate with her family, tentative meeting for Monday afternoon.  PMT will continue to support holistically  Questions and concerns addressed   Discussed with bedside RN     Total time spent on the unit was 35 minutes  Greater than 50% of the time was spent in counseling and coordination of care  Wadie Lessen NP  Palliative Medicine Team Team Phone # [phone] Pager [phone]

## 2017-11-20 NOTE — Progress Notes (Signed)
RUE PICC removed. Pressure dressing applied. Bleeding noted, required manual pressure for 81mn. IAlger Simons RN made aware.

## 2017-11-20 NOTE — Progress Notes (Signed)
PROGRESS NOTE    Dillon Andrews  OZH:[id number] DOB: Sep 12, 1940 DOA: 11/05/2017 PCP: [name], MD    Brief Narrative:  77 year old male with past medical history of liver cirrhosis with esophageal varices, combined systolic and diastolic CHF with ejection fraction of 25 to 30%, nonischemic cardiomyopathy, pulmonary hypertension, cardiac amyloidosis, CKD stage III, kappa light chain myeloma on chemotherapy was brought to the emergency department due to shortness of breath. Patient was found to be in A. fib with RVR with hypotension on presentation. Cardiology was consulted. Started on amiodarone drip. Also started on heparin for new onset A. fib. CHF team  Consulted ,  Underwent cardioversion on 7/29.    11/18/2017 patient was transferred to [facility] for close monitoring since he became hypotensive and had episode of bradycardia.  He was started on pressors briefly with improvement in his blood pressure.  lactic acid level 2.1 procalcitonin 0.44 up to 9.48.  he was started empirically on IV vancomycin and cefepime.   Today he remains afebrile. Wbc count is wnl. Improving pro calcitonin level. Blood cultures are negative.    Assessment & Plan:   Principal Problem:   Atrial fibrillation with RVR (HCC) Active Problems:   CKD (chronic kidney disease) stage 3, GFR 30-59 ml/min (HCC), baseline Cr 1.5   DM2 (diabetes mellitus, type 2) (HCC)   Acute on chronic combined systolic and diastolic CHF (congestive heart failure) (HCC)   Bilateral pleural effusion   Esophageal varices in cirrhosis (HCC)   SOB (shortness of breath)   Non-ischemic cardiomyopathy ([place])   Other secondary pulmonary hypertension (HCC)   RBBB   Cardiac amyloidosis (HCC)   Kappa light chain myeloma (HCC)   Leukocytosis   Elevated troponin   Elevated brain natriuretic peptide (BNP) level, acute on chronic   Hypotension   Palliative care by specialist   Cardiogenic shock ([place])  Acute on chronic systolic  and diastolic heart failure and amyloidosis:  2D echo 06/2017 showed EF of 25 to 30% with mild LAE, mild to moderate RV dilation moderate to severe RA. PYP scan suggestive of TTR amyloid. , patient is followed by Dr Marin Olp saw and thinks he does not have AL amyloid (just multiple myeloma) and that he has TTR amyloid and should be treated with tafamidis before chemo Tafamidis started on 8/2. Milrinone started by heart failure team.  Further management as per cardiology. Urine output about 1075 overnight.  He is on RA with good oxygen sats, normotensive.    Atrial fibrillation with RVR:  Status post TEE DCCV on 7/29, briefly returned to normal sinus rhythm  Back in atrial flutter 7/31, repeat DCCV on 8/2. Converted back to aflutter 8/5. CHADS VASC 6, continue Eliquis. Not an ablation candidate.    Liver cirrhosis:  No signs of bleeding.  Last EGD at [facility] showed esophageal varices and Barretts esophagus.  Follow up with Gi as outpatient , PPI.    Kappa light chain myeloma:  Follow up Dr. Marin Olp, just had chemotherapy few days ago, was started on bortezomib on 10/20/2016 and Revlimid on 11/04/2017 -Dr. Marin Olp following  Now on Tafamodis. No change    Acute on stage 3 CKD:  Baseline creatinine is around 1.6,  Suspect from hypotension, diuretics, spironolactone.  Improving.  2. 75> 2.68> 2 today.    Diabetes mellitus: CBG (last 3)  Recent Labs    11/19/17 1615 11/19/17 2113 11/20/17 0644  GLUCAP 169* 129* 126*   Resume SSI. A1c is 7.5  Sepsis  Unclear etiology.  ?  picc line infecton. Blood cultures have been negative so far.  RUQ is negative for acute cholecystitis.  CXR shows resolution of the right effusion.  UA is unremarkable.  He is afebrile and wbc count normalized.  Recommend to continue the broad spectrum IV antibiotics for another 24 hours and plan to transition to oral abx vd d/c abx.   DVT prophylaxis:  Code Status: FULL CODE.  Family Communication: family  at bedside.  Disposition Plan: pending clinical improvement.   Consultants:    oncology Dr Marin Olp  Heart failure Team.   Procedures: none.   Antimicrobials: none.   Subjective:  no chest pain or sob.   Objective: Vitals:   11/20/17 0600 11/20/17 0700 11/20/17 0800 11/20/17 0900  BP: 113/64 116/65 101/62 109/71  Pulse: 64 64 61 67  Resp: 15 16 (!) 24 20  Temp:      TempSrc:      SpO2: 94% 93% 93% 96%  Weight:      Height:        Intake/Output Summary (Last 24 hours) at 11/20/2017 1055 Last data filed at 11/20/2017 1000 Gross per 24 hour  Intake 1375.44 ml  Output 1175 ml  Net 200.44 ml   Filed Weights   11/18/17 0407 11/19/17 0500 11/20/17 0500  Weight: 80 kg 79.2 kg 78.6 kg    Examination:  General exam: Appears calm and comfortable  Respiratory system: Clear to auscultation. Respiratory effort normal. Cardiovascular system: S1 & S2 heard, RRR.No pedal edema. Gastrointestinal system: Abdomen is nondistended, soft and nontender.  Normal bowel sounds heard. Central nervous system: Alert and oriented. No focal neurological deficits. Extremities: Symmetric 5 x 5 power. Skin: No rashes, lesions or ulcers Psychiatry: Judgement and insight appear normal. Mood & affect appropriate.     Data Reviewed: I have personally reviewed following labs and imaging studies  CBC: Recent Labs  Lab 11/14/17 0500 11/17/17 0436 11/18/17 1113 11/19/17 0401 11/20/17 0500  WBC 10.9* 13.8* 11.9* 6.3 6.6  HGB 12.2* 11.8* 11.6* 11.2* 10.5*  HCT 38.8* 38.1* 37.6* 36.2* 34.0*  MCV 88.4 89.4 90.4 90.0 89.9  PLT 188 153 130* 109* 696*   Basic Metabolic Panel: Recent Labs  Lab 11/16/17 0421 11/17/17 0436 11/18/17 0409 11/19/17 0401 11/20/17 0500  NA 138 139 134* 134* 139  K 3.6 3.8 4.3 4.0 3.6  CL 92* 93* 89* 89* 94*  CO2 35* 34* 32 32 34*  GLUCOSE 122* 135* 180* 129* 127*  BUN 33* 36* 53* 71* 58*  CREATININE 1.78* 1.79* 2.75* 2.68* 2.06*  CALCIUM 9.0 9.0 8.6* 8.6* 8.7*   MG 2.0  --   --   --   --    GFR: Estimated Creatinine Clearance: 29.6 mL/min (A) (by C-G formula based on SCr of 2.06 mg/dL (H)). Liver Function Tests: Recent Labs  Lab 11/19/17 0401 11/20/17 0500  AST 37 33  ALT 39 36  ALKPHOS 206* 190*  BILITOT 1.7* 1.6*  PROT 6.0* 5.8*  ALBUMIN 2.7* 2.6*   No results for input(s): LIPASE, AMYLASE in the last 168 hours. Recent Labs  Lab 11/19/17 1128  AMMONIA 38*   Coagulation Profile: No results for input(s): INR, PROTIME in the last 168 hours. Cardiac Enzymes: No results for input(s): CKTOTAL, CKMB, CKMBINDEX, TROPONINI in the last 168 hours. BNP (last 3 results) No results for input(s): PROBNP in the last 8760 hours. HbA1C: No results for input(s): HGBA1C in the last 72 hours. CBG: Recent Labs  Lab 11/19/17 0704 11/19/17 1137 11/19/17  1615 11/19/17 2113 11/20/17 0644  GLUCAP 130* 154* 169* 129* 126*   Lipid Profile: No results for input(s): CHOL, HDL, LDLCALC, TRIG, CHOLHDL, LDLDIRECT in the last 72 hours. Thyroid Function Tests: Recent Labs    11/19/17 1128  TSH 1.381  FREET4 0.94   Anemia Panel: No results for input(s): VITAMINB12, FOLATE, FERRITIN, TIBC, IRON, RETICCTPCT in the last 72 hours. Sepsis Labs: Recent Labs  Lab 11/17/17 1618 11/17/17 2016 11/18/17 0409 11/18/17 1602 11/19/17 0401  PROCALCITON 0.44  --  9.48  --  7.80  LATICACIDVEN  --  2.1*  --  2.2*  --     Recent Results (from the past 240 hour(s))  Culture, blood (Routine X 2) w Reflex to ID Panel     Status: None (Preliminary result)   Collection Time: 11/17/17 11:16 AM  Result Value Ref Range Status   Specimen Description BLOOD LEFT ANTECUBITAL  Final   Special Requests   Final    BOTTLES DRAWN AEROBIC AND ANAEROBIC Blood Culture adequate volume   Culture   Final    NO GROWTH 2 DAYS Performed at [facility], [place] [address] [place], [place] [address]    Report Status PENDING  Incomplete  Culture, blood (Routine X 2) w Reflex  to ID Panel     Status: None (Preliminary result)   Collection Time: 11/17/17 11:22 AM  Result Value Ref Range Status   Specimen Description BLOOD LEFT HAND  Final   Special Requests   Final    BOTTLES DRAWN AEROBIC AND ANAEROBIC Blood Culture adequate volume   Culture   Final    NO GROWTH 2 DAYS Performed at [facility], [place] [address], [place], [place] [address]    Report Status PENDING  Incomplete  MRSA PCR Screening     Status: None   Collection Time: 11/17/17  8:27 PM  Result Value Ref Range Status   MRSA by PCR NEGATIVE NEGATIVE Final    Comment:        The GeneXpert MRSA Assay (FDA approved for NASAL specimens only), is one component of a comprehensive MRSA colonization surveillance program. It is not intended to diagnose MRSA infection nor to guide or monitor treatment for MRSA infections. Performed at [facility], [place] [address] [place], [place] [address]          Radiology Studies: No results found.      Scheduled Meds: . amiodarone  200 mg Oral BID  . apixaban  5 mg Oral BID  . bisacodyl  10 mg Oral Daily  . Chlorhexidine Gluconate Cloth  6 each Topical Daily  . docusate sodium  100 mg Oral BID  . dronabinol  2.5 mg Oral BID AC  . famciclovir  250 mg Oral Daily  . feeding supplement (ENSURE ENLIVE)  237 mL Oral BID BM  . gabapentin  800 mg Oral QHS  . hydrocortisone   Rectal TID  . insulin aspart  0-15 Units Subcutaneous TID WC  . insulin aspart  0-5 Units Subcutaneous QHS  . insulin aspart  5 Units Subcutaneous TID WC  . LORazepam  1 mg Oral Once  . magnesium oxide  400 mg Oral Daily  . Melatonin  6 mg Oral QHS  . pantoprazole  40 mg Oral Daily  . rosuvastatin  10 mg Oral Daily  . sertraline  25 mg Oral Daily  . simethicone  80 mg Oral QID  . sodium chloride flush  10-40 mL Intracatheter  Q12H  . sodium chloride flush  3 mL Intravenous Q12H  . Tafamidis Meglumine (Cardiac)  4 capsule Oral Daily  . traZODone  50 mg Oral QHS    Continuous Infusions: . sodium chloride 10 mL/hr at 11/20/17 0800  . ceFEPime (MAXIPIME) IV Stopped (11/19/17 1647)  . milrinone 0.125 mcg/kg/min (11/20/17 0800)  . norepinephrine (LEVOPHED) Adult infusion Stopped (11/18/17 1326)  . vancomycin Stopped (11/19/17 1536)     LOS: 15 days    Time spent: 35  Minutes.     Hosie Poisson, MD [facility] Pager [phone]   If 7PM-7AM, please contact night-coverage [email or web address] Password [place] 11/20/2017, 10:55 AM

## 2017-11-20 NOTE — Progress Notes (Addendum)
Patient ID: Dillon Andrews, male   DOB: 1941-02-24, 77 y.o.   MRN: [id number]     Advanced Heart Failure Rounding Note  PCP-Cardiologist: Glenetta Hew, MD   Subjective:   Yesterday Co-ox dropped so milrinone 0.125 mcg restarted. Today CO-OX is 65%.   Feeling better today. Wants to walk. Denies SOB. Feels weak. Appetite improved.     Objective:   Weight Range: 78.6 kg Body mass index is 25.59 kg/m.   Vital Signs:   Temp:  [96.9 F (36.1 C)-97.8 F (36.6 C)] 97.6 F (36.4 C) (08/08 0411) Pulse Rate:  [61-69] 61 (08/08 0800) Resp:  [11-28] 24 (08/08 0800) BP: (98-122)/(56-74) 101/62 (08/08 0800) SpO2:  [93 %-97 %] 93 % (08/08 0800) Weight:  [78.6 kg] 78.6 kg (08/08 0500) Last BM Date: 11/17/17  Weight change: Filed Weights   11/18/17 0407 11/19/17 0500 11/20/17 0500  Weight: 80 kg 79.2 kg 78.6 kg    Intake/Output:   Intake/Output Summary (Last 24 hours) at 11/20/2017 0925 Last data filed at 11/20/2017 0900 Gross per 24 hour  Intake 1145.44 ml  Output 1175 ml  Net -29.56 ml      Physical Exam    CVP 5  General:  No resp difficulty. In bed.  HEENT: normal Neck: supple. no JVD. Carotids 2+ bilat; no bruits. No lymphadenopathy or thryomegaly appreciated. Cor: PMI nondisplaced. Regular rate & rhythm. No rubs, gallops or murmurs. Lungs: clear Abdomen: soft, nontender, nondistended. No hepatosplenomegaly. No bruits or masses. Good bowel sounds. Extremities: no cyanosis, clubbing, rash, edema. RUE PICC  Neuro: alert & orientedx3, cranial nerves grossly intact. moves all 4 extremities w/o difficulty. Affect pleasant   Telemetry   NSR 60s personally reviewed.   EKG    11/17/17: Aflutter 89 bpm with RBBB. Personally reviewed.   Labs    CBC Recent Labs    11/19/17 0401 11/20/17 0500  WBC 6.3 6.6  HGB 11.2* 10.5*  HCT 36.2* 34.0*  MCV 90.0 89.9  PLT 109* 884*   Basic Metabolic Panel Recent Labs    11/19/17 0401 11/20/17 0500  NA 134* 139  K 4.0 3.6    CL 89* 94*  CO2 32 34*  GLUCOSE 129* 127*  BUN 71* 58*  CREATININE 2.68* 2.06*  CALCIUM 8.6* 8.7*   Liver Function Tests Recent Labs    11/19/17 0401 11/20/17 0500  AST 37 33  ALT 39 36  ALKPHOS 206* 190*  BILITOT 1.7* 1.6*  PROT 6.0* 5.8*  ALBUMIN 2.7* 2.6*   No results for input(s): LIPASE, AMYLASE in the last 72 hours. Cardiac Enzymes No results for input(s): CKTOTAL, CKMB, CKMBINDEX, TROPONINI in the last 72 hours.  BNP: BNP (last 3 results) Recent Labs    06/25/17 1207 09/15/17 1401 11/05/17 1310  BNP 420.9* 393.0* 515.3*    ProBNP (last 3 results) No results for input(s): PROBNP in the last 8760 hours.   D-Dimer No results for input(s): DDIMER in the last 72 hours. Hemoglobin A1C No results for input(s): HGBA1C in the last 72 hours. Fasting Lipid Panel No results for input(s): CHOL, HDL, LDLCALC, TRIG, CHOLHDL, LDLDIRECT in the last 72 hours. Thyroid Function Tests Recent Labs    11/19/17 1128  TSH 1.381    Other results:   Imaging    No results found.   Medications:     Scheduled Medications: . amiodarone  200 mg Oral BID  . apixaban  5 mg Oral BID  . bisacodyl  10 mg Oral Daily  .  Chlorhexidine Gluconate Cloth  6 each Topical Daily  . docusate sodium  100 mg Oral BID  . dronabinol  2.5 mg Oral BID AC  . famciclovir  250 mg Oral Daily  . feeding supplement (ENSURE ENLIVE)  237 mL Oral BID BM  . gabapentin  800 mg Oral QHS  . hydrocortisone   Rectal TID  . insulin aspart  0-15 Units Subcutaneous TID WC  . insulin aspart  0-5 Units Subcutaneous QHS  . insulin aspart  5 Units Subcutaneous TID WC  . LORazepam  1 mg Oral Once  . magnesium oxide  400 mg Oral Daily  . Melatonin  6 mg Oral QHS  . pantoprazole  40 mg Oral Daily  . rosuvastatin  10 mg Oral Daily  . sertraline  25 mg Oral Daily  . simethicone  80 mg Oral QID  . sodium chloride flush  10-40 mL Intracatheter Q12H  . sodium chloride flush  3 mL Intravenous Q12H  .  Tafamidis Meglumine (Cardiac)  4 capsule Oral Daily  . traZODone  50 mg Oral QHS    Infusions: . sodium chloride 10 mL/hr at 11/20/17 0800  . ceFEPime (MAXIPIME) IV Stopped (11/19/17 1647)  . milrinone 0.125 mcg/kg/min (11/20/17 0800)  . norepinephrine (LEVOPHED) Adult infusion Stopped (11/18/17 1326)  . vancomycin Stopped (11/19/17 1536)    PRN Medications: sodium chloride, acetaminophen, LORazepam, morphine injection, ondansetron (ZOFRAN) IV, simethicone, sodium chloride flush, sodium chloride flush    Patient Profile   Dillon Andrews is a 77 y.o. male with a hx of chronic combined CHF due to NICM, CKD III,presumedNASH(per pt, never proven),esophageal varices per chart(prior glass of wine occasionally but no heavy alcohol),HTN, HLD, DM, Barrett's esophagus, GERD, anemia, RBBB, TTR amyloidosis, and Kappa Light Chain myeloma.  Admitted to [facility] 11/05/17 in setting of new Afib with RVR.   Assessment/Plan   1. Acute on chronic combined HF with TTR amyloidosis ([place]) - NICM by cath at [facility] 09/2016 with EF 40-45%. Echo 06/2017: EF 25-30%, mild LAE, mild/mod RV dilation, mod/severe RA. - PYP scan strongly suggestive of TTR amyloid, SPEP with 0.8% m-spike, UPEP with Bence Jones Protein positive, kappa type. 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 - Dr Marin Olp saw and thinks he does not have AL amyloid (just multiple myeloma) and that he has TTR amyloid and should be treated with tafamidis before chemo. Tafamidis started on 8/2.   Off milrinone 8/3. Off Norep 8/6.  Creatinine 2.7>2.68> 2.06 -CO-OX 65% on milrinone 0.125 mcg.  - Hold spiro.  - No ARB/entresto with for now with CKD -SBP soft. Hold off on hydralazine/imdur  2. Sepsis - Afebrile. WBC ok. nding down.  - Remains on Vanc and Cefepine. - 8/5 Blood culture- NGTD  3. New onset AFlutter  - likely triggered HF decompensation  - s/p TEE DCCV 7/29.   - Back in atrial flutter 7/31, repeat DCCV on 8/2. Converted  back to aflutter 8/5. -Maintaining NSR - Continue  amio to 200 mg twice a day - Continue eliquis 2.5 mg twice a day   4. AKI on CKD III, baseline ~1.6 -Creatinine trending up 1.8>2.75>2.68  -Holding diuretics.   5. Recurrent bilateral effusions - Somewhat increased from March on CXR 11/05/17.   6. [place] with Dr. Marin Olp who has seen this admission.  - Started on Bortezomib7/18/18andRevlimid7/23/19. - Continue management per primary team. - Dr Marin Olp thinks he does not have AL amyloid and that he does have TTR  amyloid and should be treated with tafamodis before chemo. Now on Tafamidis. No change.   7. NASH with esophageal varices.  - Watch closely for bleeding with AC. Had some BRBPR with straining and bleeding at PICC site. Hemoglobin has been stable. CBC pending this am.  - Last EGD 06/21/2014 at [facility] for surveillance of Esophageal varices, Barrett's esophagus, and PMHx of Colon polyps. Showed two columns of small esophageal varices w/out stigmata, short segment Barrett's esophagus (without biopsies, due to presence of EVs), 5cm hiatal hernia, and hyperplastic small gastric nodules.   8. DM2 - Per primary. No change  9. Hypokalemia. - stable.   11. Constipation/Abdominal pain - Nausea this morning ? Low output. KUB 8/5 negative for obstruction - Continue dulcolax daily. - Abdominal pain resolved.  - Abdominal US 8/5 negative for acute findings  Palliative Care following.  Discuss line holiday with Dr [name]. He understands home milrinone is for quality of life and will not be a long term fix.    Length of Stay: [place], NP  9:25 AM  Advanced Heart Failure Team Pager [phone] (M-F; 7a - 4p)  Please contact [facility] for night-coverage after hours (4p -7a ) and weekends on [email or web address]  Discussed with Dr [name]. Place PIV for milrinone to allow for line holiday. Remove PICC. CHeck blood cx x2 in am.   Dr  [name] plans to discuss with family tomorrow morning at 0900  Amy Clegg NP-C 4:16 PM  Agree with above.   Co-ox dropped yesterday and was consistent with cardiogenic shock. Milrinone restarted. Feeling better today but still weak. CVP and co-ox improved.  Remains in NSR. WBC down. BCx NGTD.  Exam JVP 5-6 Cor RRR +s3 Lungs CTA Ab soft NT Ext no edema  He remains very tenuous and is likely nearing the end of his life. Palliative care meeting has been set up. We will give him a trial of home milrinone. Remove PICC. 48 hour line holiday with surveillance cx. Then place tunneled PICC. I will meet with family tomorrow.   CRITICAL CARE Performed by: Glori Bickers  Total critical care time: 35 minutes  Critical care time was exclusive of separately billable procedures and treating other patients.  Critical care was necessary to treat or prevent imminent or life-threatening deterioration.  Critical care was time spent personally by me (independent of midlevel providers or residents) on the following activities: development of treatment plan with patient and/or surrogate as well as nursing, discussions with consultants, evaluation of patient's response to treatment, examination of patient, obtaining history from patient or surrogate, ordering and performing treatments and interventions, ordering and review of laboratory studies, ordering and review of radiographic studies, pulse oximetry and re-evaluation of patient's condition.  Glori Bickers, MD  4:44 PM

## 2017-11-21 ENCOUNTER — Encounter (HOSPITAL_COMMUNITY): Admission: RE | Admit: 2017-11-21 | Payer: Medicare HMO | Source: Ambulatory Visit

## 2017-11-21 ENCOUNTER — Encounter (HOSPITAL_COMMUNITY): Payer: Medicare HMO

## 2017-11-21 LAB — GLUCOSE, CAPILLARY
GLUCOSE-CAPILLARY: 148 mg/dL — AB (ref 70–99)
Glucose-Capillary: 109 mg/dL — ABNORMAL HIGH (ref 70–99)
Glucose-Capillary: 152 mg/dL — ABNORMAL HIGH (ref 70–99)
Glucose-Capillary: 158 mg/dL — ABNORMAL HIGH (ref 70–99)
Glucose-Capillary: 97 mg/dL (ref 70–99)

## 2017-11-21 LAB — CBC
HCT: 33.8 % — ABNORMAL LOW (ref 39.0–52.0)
Hemoglobin: 10.5 g/dL — ABNORMAL LOW (ref 13.0–17.0)
MCH: 27.7 pg (ref 26.0–34.0)
MCHC: 31.1 g/dL (ref 30.0–36.0)
MCV: 89.2 fL (ref 78.0–100.0)
Platelets: 119 10*3/uL — ABNORMAL LOW (ref 150–400)
RBC: 3.79 MIL/uL — AB (ref 4.22–5.81)
RDW: 18.2 % — ABNORMAL HIGH (ref 11.5–15.5)
WBC: 5.9 10*3/uL (ref 4.0–10.5)

## 2017-11-21 LAB — BASIC METABOLIC PANEL
Anion gap: 10 (ref 5–15)
BUN: 38 mg/dL — ABNORMAL HIGH (ref 8–23)
CALCIUM: 9 mg/dL (ref 8.9–10.3)
CO2: 33 mmol/L — AB (ref 22–32)
Chloride: 96 mmol/L — ABNORMAL LOW (ref 98–111)
Creatinine, Ser: 1.67 mg/dL — ABNORMAL HIGH (ref 0.61–1.24)
GFR calc non Af Amer: 38 mL/min — ABNORMAL LOW (ref >60)
GFR, EST AFRICAN AMERICAN: 44 mL/min — AB (ref >60)
Glucose, Bld: 119 mg/dL — ABNORMAL HIGH (ref 70–99)
Potassium: 4.5 mmol/L (ref 3.5–5.1)
Sodium: 139 mmol/L (ref 135–145)

## 2017-11-21 LAB — PROCALCITONIN: PROCALCITONIN: 1.52 ng/mL

## 2017-11-21 MED ORDER — POLYETHYLENE GLYCOL 3350 17 G PO PACK
17.0000 g | PACK | Freq: Every day | ORAL | Status: DC
  Administered 2017-11-21 – 2017-11-22 (×2): 17 g via ORAL
  Filled 2017-11-21 (×4): qty 1

## 2017-11-21 MED ORDER — SENNOSIDES-DOCUSATE SODIUM 8.6-50 MG PO TABS
2.0000 | ORAL_TABLET | Freq: Two times a day (BID) | ORAL | Status: DC
  Administered 2017-11-21 – 2017-11-24 (×4): 2 via ORAL
  Filled 2017-11-21 (×8): qty 2

## 2017-11-21 MED ORDER — SORBITOL 70 % SOLN
30.0000 mL | Freq: Once | Status: AC
  Administered 2017-11-21: 30 mL via ORAL
  Filled 2017-11-21: qty 30

## 2017-11-21 MED ORDER — BISACODYL 10 MG RE SUPP: 10.0000 mg | Freq: Every day | RECTAL | Status: DC | PRN

## 2017-11-21 NOTE — Progress Notes (Addendum)
Patient ID: Dillon Andrews, male   DOB: Sep 01, 1940, 77 y.o.   MRN: [id number]     Advanced Heart Failure Rounding Note  PCP-Cardiologist: Glenetta Hew, MD   Subjective:    Remains on milrinone. PICC line removed for line holiday. Blood cultures NGTD. BMET pending. Weight up 2 lbs.   Met with Palliative yesterday. Remains full code. Wants to see how the next few days go.   Walked with PT yesterday. Recommending HHPT.  Denies CP, SOB, or bleeding. No fever or chills. Was a little dizzy with walking. Appetite improved. No BM in 4-5 days.   Family meeting with Dr [name] today at 9 am.   Objective:   Weight Range: 79.7 kg Body mass index is 25.95 kg/m.   Vital Signs:   Temp:  [97.4 F (36.3 C)-98.3 F (36.8 C)] 97.5 F (36.4 C) (08/09 0409) Pulse Rate:  [62-73] 65 (08/09 0600) Resp:  [13-24] 18 (08/09 0600) BP: (100-118)/(56-71) 110/64 (08/09 0600) SpO2:  [91 %-97 %] 92 % (08/09 0600) Weight:  [79.7 kg] 79.7 kg (08/09 0600) Last BM Date: 11/17/17  Weight change: Filed Weights   11/19/17 0500 11/20/17 0500 11/21/17 0600  Weight: 79.2 kg 78.6 kg 79.7 kg    Intake/Output:   Intake/Output Summary (Last 24 hours) at 11/21/2017 0803 Last data filed at 11/21/2017 0600 Gross per 24 hour  Intake 1476.12 ml  Output 875 ml  Net 601.12 ml      Physical Exam    General: Well appearing. No resp difficulty. HEENT: Normal Neck: Supple. JVP 5-6. Carotids 2+ bilat; no bruits. No thyromegaly or nodule noted. Cor: PMI nondisplaced. RRR, No M/G/R noted Lungs: CTAB, normal effort. Abdomen: Soft, non-tender, non-distended, no HSM. No bruits or masses. +BS  Extremities: No cyanosis, clubbing, or rash. R and LLE no edema.  Neuro: Alert & orientedx3, cranial nerves grossly intact. moves all 4 extremities w/o difficulty. Affect pleasant   Telemetry   NSR 60s. Personally reviewed.   EKG    No new tracings.   Labs    CBC Recent Labs    11/20/17 0500 11/21/17 0528  WBC  6.6 5.9  HGB 10.5* 10.5*  HCT 34.0* 33.8*  MCV 89.9 89.2  PLT 107* 703*   Basic Metabolic Panel Recent Labs    11/19/17 0401 11/20/17 0500  NA 134* 139  K 4.0 3.6  CL 89* 94*  CO2 32 34*  GLUCOSE 129* 127*  BUN 71* 58*  CREATININE 2.68* 2.06*  CALCIUM 8.6* 8.7*   Liver Function Tests Recent Labs    11/19/17 0401 11/20/17 0500  AST 37 33  ALT 39 36  ALKPHOS 206* 190*  BILITOT 1.7* 1.6*  PROT 6.0* 5.8*  ALBUMIN 2.7* 2.6*   No results for input(s): LIPASE, AMYLASE in the last 72 hours. Cardiac Enzymes No results for input(s): CKTOTAL, CKMB, CKMBINDEX, TROPONINI in the last 72 hours.  BNP: BNP (last 3 results) Recent Labs    06/25/17 1207 09/15/17 1401 11/05/17 1310  BNP 420.9* 393.0* 515.3*    ProBNP (last 3 results) No results for input(s): PROBNP in the last 8760 hours.   D-Dimer No results for input(s): DDIMER in the last 72 hours. Hemoglobin A1C No results for input(s): HGBA1C in the last 72 hours. Fasting Lipid Panel No results for input(s): CHOL, HDL, LDLCALC, TRIG, CHOLHDL, LDLDIRECT in the last 72 hours. Thyroid Function Tests Recent Labs    11/19/17 1128  TSH 1.381    Other results:   Imaging  No results found.   Medications:     Scheduled Medications: . amiodarone  200 mg Oral BID  . apixaban  5 mg Oral BID  . bisacodyl  10 mg Oral Daily  . docusate sodium  100 mg Oral BID  . dronabinol  2.5 mg Oral BID AC  . famciclovir  250 mg Oral Daily  . feeding supplement (ENSURE ENLIVE)  237 mL Oral BID BM  . gabapentin  800 mg Oral QHS  . hydrocortisone   Rectal TID  . insulin aspart  0-15 Units Subcutaneous TID WC  . insulin aspart  0-5 Units Subcutaneous QHS  . insulin aspart  5 Units Subcutaneous TID WC  . LORazepam  1 mg Oral Once  . magnesium oxide  400 mg Oral Daily  . Melatonin  6 mg Oral QHS  . pantoprazole  40 mg Oral Daily  . rosuvastatin  10 mg Oral Daily  . sertraline  25 mg Oral Daily  . simethicone  80 mg Oral  QID  . sodium chloride flush  3 mL Intravenous Q12H  . Tafamidis Meglumine (Cardiac)  4 capsule Oral Daily  . traZODone  50 mg Oral QHS    Infusions: . sodium chloride Stopped (11/20/17 1640)  . ceFEPime (MAXIPIME) IV Stopped (11/20/17 1457)  . milrinone 0.125 mcg/kg/min (11/21/17 0600)  . norepinephrine (LEVOPHED) Adult infusion Stopped (11/18/17 1326)  . vancomycin Stopped (11/20/17 1419)    PRN Medications: sodium chloride, acetaminophen, LORazepam, morphine injection, ondansetron (ZOFRAN) IV, simethicone, sodium chloride flush, zolpidem    Patient Profile   Dillon Andrews is a 77 y.o. male with a hx of chronic combined CHF due to NICM, CKD III,presumedNASH(per pt, never proven),esophageal varices per chart(prior glass of wine occasionally but no heavy alcohol),HTN, HLD, DM, Barrett's esophagus, GERD, anemia, RBBB, TTR amyloidosis, and Kappa Light Chain myeloma.  Admitted to [facility] 11/05/17 in setting of new Afib with RVR.   Assessment/Plan   1. Acute on chronic combined HF with TTR amyloidosis (Wild Type) - NICM by cath at [facility] 09/2016 with EF 40-45%. Echo 06/2017: EF 25-30%, mild LAE, mild/mod RV dilation, mod/severe RA. - PYP scan strongly suggestive of TTR amyloid, SPEP with 0.8% m-spike, UPEP with Bence Jones Protein positive, kappa type. 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 - Dr Marin Olp saw and thinks he does not have AL amyloid (just multiple myeloma) and that he has TTR amyloid and should be treated with tafamidis before chemo. Tafamidis started on 8/2.   Off milrinone 8/3. Milrinone restarted 8/7. Off Norep 8/6. - Volume status okay  Creatinine 2.7>2.68> 2.06> BMET pending.  - Continue milrinone 0.125 mcg.  - Hold spiro.  - No ARB/entresto with for now with CKD -SBP soft 100s. Hold off on hydralazine/imdur  2. Sepsis - Afebrile. WBC normal - Remains on Vanc and Cefepine. - 8/5 Blood culture- NGTD - PICC removed. Repeat blood cultures done this am.  Will need tunneled line for milrinone.   3. New onset AFlutter  - likely triggered HF decompensation  - s/p TEE DCCV 7/29.   - Back in atrial flutter 7/31, repeat DCCV on 8/2. Converted back to aflutter 8/5. - Maintaining NSR - Continue  amio to 200 mg twice a day - Continue eliquis 2.5 mg twice a day  4. AKI on CKD III, baseline ~1.6 -Creatinine trending up 1.8>2.75>2.68 > BMET pending.  -Holding diuretics.   5. Recurrent bilateral effusions - Somewhat increased from March on CXR 11/05/17. No change.  6. [place] with Dr. Marin Olp who has seen this admission.  - Started on Bortezomib7/18/18andRevlimid7/23/19. - Continue management per primary team. - Dr Marin Olp thinks he does not have AL amyloid and that he does have TTR amyloid and should be treated with tafamodis before chemo. Now on Tafamidis. No change.   7. NASH with esophageal varices.  - Watch closely for bleeding with AC. Hemoglobin stable 10.5. - Last EGD 06/21/2014 at [facility] for surveillance of Esophageal varices, Barrett's esophagus, and PMHx of Colon polyps. Showed two columns of small esophageal varices w/out stigmata, short segment Barrett's esophagus (without biopsies, due to presence of EVs), 5cm hiatal hernia, and hyperplastic small gastric nodules.   8. DM2 - Per primary. No change.   9. Hypokalemia. - BMET pending.    11. Constipation/Abdominal pain - KUB 8/5 negative for obstruction - Continue dulcolax daily. - Abdominal pain resolved.  - Abdominal US 8/5 negative for acute findings - No BM in 4-5 days. Give sorbitol this am  Family meeting planned for 9 am today with Dr [name].  Can probably transfer to stepdown  Length of Stay: [place], NP  8:02 AM  Advanced Heart Failure Team Pager [phone] (M-F; 7a - 4p)  Please contact [facility] for night-coverage after hours (4p -7a ) and weekends on [email or web address]  Patient seen and examined with the  above-signed Advanced Practice Provider and/or Housestaff. I personally reviewed laboratory data, imaging studies and relevant notes. I independently examined the patient and formulated the important aspects of the plan. I have edited the note to reflect any of my changes or salient points. I have personally discussed the plan with the patient and/or family.  Feeling better on milrinone. No SOB. More energy. Appetite improved. Creatinine back to baseline. PICC out. Bcx negative  On exam General:  Well appearing. No resp difficulty HEENT: normal Neck: supple. JVP 7-8. Carotids 2+ bilat; no bruits. No lymphadenopathy or thryomegaly appreciated. Cor: PMI laterally displaced. Regular rate & rhythm. No rubs, gallops or murmurs. Lungs: clear Abdomen: soft, nontender, nondistended. No hepatosplenomegaly. No bruits or masses. Good bowel sounds. Extremities: no cyanosis, clubbing, rash, edema Neuro: alert & orientedx3, cranial nerves grossly intact. moves all 4 extremities w/o difficulty. Affect pleasant  I had a long talk with him and his family summarizing his hospitalization and discussing options going forward. We have elected to continue milrinone at home and continue tafamadis to see if we can stabilize his cardiomyopathy. We also discussed difference between palliative care, hospice and code status. They are interested in palliative care help but not hospice for now. Have decided on DNR/DNI.   Will move to [place]. Will place tunneled PICC on Monday and plan home Monday with milrinone.   Glori Bickers, MD  10:14 AM

## 2017-11-21 NOTE — Plan of Care (Signed)
  Problem: Activity: Goal: Ability to tolerate increased activity will improve Outcome: Progressing   Problem: Health Behavior/Discharge Planning: Goal: Ability to safely manage health-related needs after discharge will improve Outcome: Progressing

## 2017-11-21 NOTE — Care Management Note (Signed)
Case Management Note Previous CM note completed by Bethena Roys, RN 11/07/2017, 11:29 AM   Patient Details  Name: Dillon Andrews MRN: [id number] Date of Birth: [birth year]  Subjective/Objective: Pt presented for Atrial Fib- Initiated on IV Amio gtt. Plan for Cardioversion question Monday. PT recommendations for [facility] PT [facility]. Agency List provided and patient chose [facility] for Services. Patient has DME RW at home.                    Action/Plan: Referral sent to [facility] with [facility] for PT. Pt will need HH PT order and F2F. SOC to begin within 24-48 hours post transition home. CM will provide patient with Eliquis 30 day free card. [facility] [address] has medication available. No further needs from CM at this time.    Expected Discharge Date:  11/10/17               Expected Discharge Plan:  [place]  In-House Referral:  NA  Discharge planning Services  CM Consult, Medication Assistance  Post Acute Care Choice:  Home Health Choice offered to:  Patient  DME Arranged:  N/A DME Agency:  NA  HH Arranged:  PT, RN, Nurse's Aide, Social Work(REDs Vest, home IV milrinone), RN HH Agency:  [place]  Status of Service:  Completed, signed off  If discussed at [employer], dates discussed:    Additional Comments:  11/21/17- 1400- Trine Fread RN, CM - noted pt to go home on IV milrinone, spoke with Pam at [facility] who is following for infusion needs- pt will also have REDs vest for HF management. CM in to see pt at bedside for transition of care needs- confirmed pt will use [facility] for [place] with [facility] has already spoken with pt and daughter. Plan is for pt to get PICC line Monday 8/12 and d/c home- pt denies any DME needs states he has a rollator at home. [facility], has 30 day free card for Eliquis to use on discharge. Pt reports that on of his 4 children will transport him home. CM will continue to follow for any further  transition of care needs.   [place] 11-14-17 Jacqlyn Krauss, RN BSN [phone] Pt will benefit from [facility] RN once stable to transition home. Pt will need HH RN, PT & F2F once stable to transition home. [facility] aware that RN will be added. No further needs from CM at this time.    S/W DONNA @ [place] [facility] # [phone]    1. ELIQUIS 2.5 MG BID  COVER- YES  CO-PAY- $ 8.50  TIER- 3 DRUG  PRIOR APPROVAL - NO   2. ELIQUIS  5 MG BID  COVER- YES  CO-PAY- $ 8.50  TIER- 3 DRUG  PRIOR APPROVAL- NO    PREFERRED PHARMACY: YES  [facility]  [place] DONNA @ [place] # [phone]    1. XARELTO 20 MG DAILY  COVER- YES  CO-PAY- $ 8.50  TIER- 3 DRUG  PRIOR APPROVAL- NO   PREFERRED PHARMACY : YES  [facility]    Zackory Pudlo, Romeo Rabon, RN 11/21/2017, 3:50 PM Unit [facility] 1-22 Case Manager  [phone]

## 2017-11-21 NOTE — Progress Notes (Addendum)
PROGRESS NOTE    Dillon Andrews  XBL:[id number] DOB: Nov 28, 1940 DOA: 11/05/2017 PCP: [name], MD    Brief Narrative:  77 year old male with past medical history of liver cirrhosis with esophageal varices, combined systolic and diastolic CHF with ejection fraction of 25 to 30%, nonischemic cardiomyopathy, pulmonary hypertension, cardiac amyloidosis, CKD stage III, kappa light chain myeloma on chemotherapy was brought to the emergency department due to shortness of breath. Patient was found to be in A. fib with RVR with hypotension on presentation. Cardiology was consulted. Started on amiodarone drip. Also started on heparin for new onset A. fib. CHF team  Consulted ,  Underwent cardioversion on 7/29.    11/18/2017 patient was transferred to [facility] for close monitoring since he became hypotensive and had episode of bradycardia.  He was started on pressors briefly with improvement in his blood pressure.  lactic acid level 2.1 procalcitonin 0.44 up to 9.48.  he was started empirically on IV vancomycin and cefepime. Wbc count is wnl. Improving pro calcitonin level. Blood cultures are negative.   Palliative care team on board and following the patient.    Assessment & Plan:   Principal Problem:   Atrial fibrillation with RVR (HCC) Active Problems:   CKD (chronic kidney disease) stage 3, GFR 30-59 ml/min (HCC), baseline Cr 1.5   DM2 (diabetes mellitus, type 2) (HCC)   Acute on chronic combined systolic and diastolic CHF (congestive heart failure) (HCC)   Bilateral pleural effusion   Esophageal varices in cirrhosis (HCC)   SOB (shortness of breath)   Non-ischemic cardiomyopathy ([place])   Other secondary pulmonary hypertension (HCC)   RBBB   Cardiac amyloidosis (HCC)   Weakness generalized   Kappa light chain myeloma (HCC)   Leukocytosis   Elevated troponin   Elevated brain natriuretic peptide (BNP) level, acute on chronic   Hypotension   Palliative care by specialist  Cardiogenic shock ([place])  Acute on chronic systolic and diastolic heart failure and amyloidosis:  2D echo 06/2017 showed EF of 25 to 30% with mild LAE, mild to moderate RV dilation moderate to severe RA. PYP scan suggestive of TTR amyloid. , patient is followed by Dr Marin Olp saw and thinks he does not have AL amyloid (just multiple myeloma) and that he has TTR amyloid and should be treated with tafamidis before chemo Tafamidis started on 8/2. Milrinone started by heart failure team on 8/7.  Further management as per cardiology. Urine output about 875 overnight.  He is on RA with good oxygen sats, BP parameters borderline.  Pt reports dizziness on standing and on ambulation.    Atrial fibrillation with RVR:  Status post TEE DCCV on 7/29, briefly returned to normal sinus rhythm  Back in atrial flutter 7/31, repeat DCCV on 8/2. Converted back to aflutter 8/5. CHADS VASC 6, continue Eliquis. Not an ablation candidate.  No new complaints of palpitations.    Liver cirrhosis:  No signs of bleeding.  Last EGD at [facility] showed esophageal varices and Barretts esophagus.  Follow up with Gi as outpatient , PPI.    Kappa light chain myeloma:  Follow up Dr. Marin Olp, just had chemotherapy few days ago, was started on bortezomib on 10/20/2016 and Revlimid on 11/04/2017 -Dr. Marin Olp following  Now on Tafamodis. No change    Acute on stage 3 CKD:  Baseline creatinine is around 1.6,  Suspect from hypotension, diuretics, spironolactone.  Improving.  2. 75> 2.68> 2 > 1.67.  Potassium around 4.5 today.    Diabetes mellitus:  CBG (last 3)  Recent Labs    11/20/17 1726 11/20/17 2122 11/21/17 0641  GLUCAP 141* 141* 109*   Resume SSI. A1c is 7.5. No change in medications.   Sepsis  Unclear etiology.  ? picc line infecton. Blood cultures have been negative so far.  RUQ is negative for acute cholecystitis.  CXR shows resolution of the right effusion.  UA is unremarkable.  He is afebrile and wbc  count normalized.   recheck pro calcitonin today. Will d/c antibiotics if the pro calcitonin is improving  DVT prophylaxis: Eliquis.  Code Status: FULL CODE.  Family Communication: family at bedside.  Disposition Plan: pending clinical improvement and palliative care   Consultants:    oncology Dr Marin Olp  Heart failure Team. Dr Sung Amabile.   Palliative care consult.    Procedures: none.   Antimicrobials: vancomycin and cefepime.   Subjective: No chest pain or sob, reports having dizziness on ambulation, but improved from admission.  Small BM yesterday.   Objective: Vitals:   11/21/17 0409 11/21/17 0500 11/21/17 0600 11/21/17 0700  BP:  (!) 110/59 110/64   Pulse:  70 65   Resp:  18 18   Temp: (!) 97.5 F (36.4 C)   97.8 F (36.6 C)  TempSrc: Axillary   Oral  SpO2:  94% 92%   Weight:   79.7 kg   Height:        Intake/Output Summary (Last 24 hours) at 11/21/2017 0837 Last data filed at 11/21/2017 0800 Gross per 24 hour  Intake 1602.06 ml  Output 1075 ml  Net 527.06 ml   Filed Weights   11/19/17 0500 11/20/17 0500 11/21/17 0600  Weight: 79.2 kg 78.6 kg 79.7 kg    Examination:  General exam: Appears calm and comfortable on RA. Not in any distress.  Respiratory system: Clear to auscultation. Respiratory effort normal. No wheezing or rhonchi.  Cardiovascular system: S1 & S2 heard, RRR.No pedal edema. Gastrointestinal system: Abdomen is soft non tender non distended,   Normal bowel sounds heard. Central nervous system: Alert and oriented. Non focal  Extremities: Symmetric 5 x 5 power. No pedal edema.  Skin: No rashes, lesions or ulcers Psychiatry: . Mood & affect appropriate.     Data Reviewed: I have personally reviewed following labs and imaging studies  CBC: Recent Labs  Lab 11/17/17 0436 11/18/17 1113 11/19/17 0401 11/20/17 0500 11/21/17 0528  WBC 13.8* 11.9* 6.3 6.6 5.9  HGB 11.8* 11.6* 11.2* 10.5* 10.5*  HCT 38.1* 37.6* 36.2* 34.0* 33.8*  MCV  89.4 90.4 90.0 89.9 89.2  PLT 153 130* 109* 107* 324*   Basic Metabolic Panel: Recent Labs  Lab 11/16/17 0421 11/17/17 0436 11/18/17 0409 11/19/17 0401 11/20/17 0500 11/21/17 0729  NA 138 139 134* 134* 139 139  K 3.6 3.8 4.3 4.0 3.6 4.5  CL 92* 93* 89* 89* 94* 96*  CO2 35* 34* 32 32 34* 33*  GLUCOSE 122* 135* 180* 129* 127* 119*  BUN 33* 36* 53* 71* 58* 38*  CREATININE 1.78* 1.79* 2.75* 2.68* 2.06* 1.67*  CALCIUM 9.0 9.0 8.6* 8.6* 8.7* 9.0  MG 2.0  --   --   --   --   --    GFR: Estimated Creatinine Clearance: 36.5 mL/min (A) (by C-G formula based on SCr of 1.67 mg/dL (H)). Liver Function Tests: Recent Labs  Lab 11/19/17 0401 11/20/17 0500  AST 37 33  ALT 39 36  ALKPHOS 206* 190*  BILITOT 1.7* 1.6*  PROT 6.0* 5.8*  ALBUMIN  2.7* 2.6*   No results for input(s): LIPASE, AMYLASE in the last 168 hours. Recent Labs  Lab 11/19/17 1128  AMMONIA 38*   Coagulation Profile: No results for input(s): INR, PROTIME in the last 168 hours. Cardiac Enzymes: No results for input(s): CKTOTAL, CKMB, CKMBINDEX, TROPONINI in the last 168 hours. BNP (last 3 results) No results for input(s): PROBNP in the last 8760 hours. HbA1C: No results for input(s): HGBA1C in the last 72 hours. CBG: Recent Labs  Lab 11/20/17 1219 11/20/17 1514 11/20/17 1726 11/20/17 2122 11/21/17 0641  GLUCAP 170* 178* 141* 141* 109*   Lipid Profile: No results for input(s): CHOL, HDL, LDLCALC, TRIG, CHOLHDL, LDLDIRECT in the last 72 hours. Thyroid Function Tests: Recent Labs    11/19/17 1128  TSH 1.381  FREET4 0.94   Anemia Panel: No results for input(s): VITAMINB12, FOLATE, FERRITIN, TIBC, IRON, RETICCTPCT in the last 72 hours. Sepsis Labs: Recent Labs  Lab 11/17/17 1618 11/17/17 2016 11/18/17 0409 11/18/17 1602 11/19/17 0401  PROCALCITON 0.44  --  9.48  --  7.80  LATICACIDVEN  --  2.1*  --  2.2*  --     Recent Results (from the past 240 hour(s))  Culture, blood (Routine X 2) w Reflex  to ID Panel     Status: None (Preliminary result)   Collection Time: 11/17/17 11:16 AM  Result Value Ref Range Status   Specimen Description BLOOD LEFT ANTECUBITAL  Final   Special Requests   Final    BOTTLES DRAWN AEROBIC AND ANAEROBIC Blood Culture adequate volume   Culture   Final    NO GROWTH 3 DAYS Performed at [facility], [place] [address] [place], [place] [address]    Report Status PENDING  Incomplete  Culture, blood (Routine X 2) w Reflex to ID Panel     Status: None (Preliminary result)   Collection Time: 11/17/17 11:22 AM  Result Value Ref Range Status   Specimen Description BLOOD LEFT HAND  Final   Special Requests   Final    BOTTLES DRAWN AEROBIC AND ANAEROBIC Blood Culture adequate volume   Culture   Final    NO GROWTH 3 DAYS Performed at [facility], [place] [address] [place], [place] [address]    Report Status PENDING  Incomplete  MRSA PCR Screening     Status: None   Collection Time: 11/17/17  8:27 PM  Result Value Ref Range Status   MRSA by PCR NEGATIVE NEGATIVE Final    Comment:        The GeneXpert MRSA Assay (FDA approved for NASAL specimens only), is one component of a comprehensive MRSA colonization surveillance program. It is not intended to diagnose MRSA infection nor to guide or monitor treatment for MRSA infections. Performed at [facility], [place] [address], [place], [place] [address]          Radiology Studies: No results found.      Scheduled Meds: . amiodarone  200 mg Oral BID  . apixaban  5 mg Oral BID  . bisacodyl  10 mg Oral Daily  . dronabinol  2.5 mg Oral BID AC  . famciclovir  250 mg Oral Daily  . feeding supplement (ENSURE ENLIVE)  237 mL Oral BID BM  . gabapentin  800 mg Oral QHS  . hydrocortisone   Rectal TID  . insulin aspart  0-15 Units Subcutaneous TID WC  . insulin aspart  0-5 Units Subcutaneous QHS  . insulin aspart  5  Units Subcutaneous TID WC  . LORazepam  1 mg Oral Once  . magnesium oxide   400 mg Oral Daily  . Melatonin  6 mg Oral QHS  . pantoprazole  40 mg Oral Daily  . polyethylene glycol  17 g Oral Daily  . rosuvastatin  10 mg Oral Daily  . senna-docusate  2 tablet Oral BID  . sertraline  25 mg Oral Daily  . simethicone  80 mg Oral QID  . sodium chloride flush  3 mL Intravenous Q12H  . sorbitol  30 mL Oral Once  . Tafamidis Meglumine (Cardiac)  4 capsule Oral Daily  . traZODone  50 mg Oral QHS   Continuous Infusions: . sodium chloride Stopped (11/20/17 1640)  . ceFEPime (MAXIPIME) IV Stopped (11/20/17 1457)  . milrinone 0.125 mcg/kg/min (11/21/17 0800)  . norepinephrine (LEVOPHED) Adult infusion Stopped (11/18/17 1326)  . vancomycin Stopped (11/20/17 1419)     LOS: 16 days    Time spent: 36  Minutes.     Hosie Poisson, MD [facility] Pager [phone]   If 7PM-7AM, please contact night-coverage [email or web address] Password [facility] 11/21/2017, 8:37 AM

## 2017-11-21 NOTE — Progress Notes (Signed)
Home Paraenteral Inotropic Therapy : Data Collection Form  Patients name: Dillon Andrews   Date: 11/21/17  Information below may not be completed by the supplier nor anyone in a Financial relationship with the supplier.  1. Results of invasive hemodynamic monitoring  Cardiac Index Before Inotrope infusion:            1.36               On Inotrope infusion:            2.69               Drug and dose:   Milrinone @ 0.125 mcg/kg/min  2. Cardiac medications immediately prior to inotrope infusion (List name, dose, and frequency) Amiodarone 200 mg BID Apixiban 5 mg BID Tafamidis 80 mg daily  3. Dose this represent maximum tolerated doses of these medications? Yes. Limited by low blood pressures and CKD  4. Breathing status Prior to inotrope infusion: Dyspnea at rest At time of discharge: Dyspnea on moderate exertion.   5. Initial home prescription Drug and Dose:   Milrinone 0.125 mcg/kg/min for continuous infusion 24/hr day and 7 days/week  6. If continuous infusion is prescribed, have attempts to discontinue inotrope infusion in the hospital failed?   Yes.   7. If intermittent infusion is prescribed, have there been repeated hospitalizations for heart failure which Parenteral inotrope were required? Not applicable.   8. Is patient capable of going to the physician for outpatient evaluation? Yes.   9. Is routine electrocardiographic monitoring required in the Home?  No.   The above statements and any additional explanations included separately are true and accurate and there is documentation present in the patients medical record to support these statements.   Completed by Georgiana Shore, NP   In instances where this form was completed by an Advanced Practice Provider, please see EMR for physician Co-Signature.

## 2017-11-21 NOTE — Progress Notes (Signed)
Patient transferred to [facility] by RN via wheelchair accompanied by daughter.  Milrinone infusing at 0.123 mcg/kg/min. Belongings retrieved by daughter.

## 2017-11-22 DIAGNOSIS — E1122 Type 2 diabetes mellitus with diabetic chronic kidney disease: Secondary | ICD-10-CM

## 2017-11-22 DIAGNOSIS — I8510 Secondary esophageal varices without bleeding: Secondary | ICD-10-CM

## 2017-11-22 DIAGNOSIS — I5043 Acute on chronic combined systolic (congestive) and diastolic (congestive) heart failure: Secondary | ICD-10-CM

## 2017-11-22 DIAGNOSIS — N183 Chronic kidney disease, stage 3 (moderate): Secondary | ICD-10-CM

## 2017-11-22 DIAGNOSIS — C9000 Multiple myeloma not having achieved remission: Secondary | ICD-10-CM

## 2017-11-22 DIAGNOSIS — K7460 Unspecified cirrhosis of liver: Secondary | ICD-10-CM

## 2017-11-22 LAB — CBC
HEMATOCRIT: 33.1 % — AB (ref 39.0–52.0)
Hemoglobin: 10.2 g/dL — ABNORMAL LOW (ref 13.0–17.0)
MCH: 27.9 pg (ref 26.0–34.0)
MCHC: 30.8 g/dL (ref 30.0–36.0)
MCV: 90.7 fL (ref 78.0–100.0)
Platelets: 126 10*3/uL — ABNORMAL LOW (ref 150–400)
RBC: 3.65 MIL/uL — ABNORMAL LOW (ref 4.22–5.81)
RDW: 18.3 % — ABNORMAL HIGH (ref 11.5–15.5)
WBC: 5.8 10*3/uL (ref 4.0–10.5)

## 2017-11-22 LAB — BASIC METABOLIC PANEL
Anion gap: 8 (ref 5–15)
BUN: 29 mg/dL — AB (ref 8–23)
CO2: 34 mmol/L — AB (ref 22–32)
Calcium: 8.7 mg/dL — ABNORMAL LOW (ref 8.9–10.3)
Chloride: 95 mmol/L — ABNORMAL LOW (ref 98–111)
Creatinine, Ser: 1.5 mg/dL — ABNORMAL HIGH (ref 0.61–1.24)
GFR calc Af Amer: 50 mL/min — ABNORMAL LOW (ref >60)
GFR calc non Af Amer: 43 mL/min — ABNORMAL LOW (ref >60)
GLUCOSE: 115 mg/dL — AB (ref 70–99)
POTASSIUM: 4.3 mmol/L (ref 3.5–5.1)
Sodium: 137 mmol/L (ref 135–145)

## 2017-11-22 LAB — GLUCOSE, CAPILLARY
GLUCOSE-CAPILLARY: 103 mg/dL — AB (ref 70–99)
Glucose-Capillary: 152 mg/dL — ABNORMAL HIGH (ref 70–99)
Glucose-Capillary: 115 mg/dL — ABNORMAL HIGH (ref 70–99)
Glucose-Capillary: 153 mg/dL — ABNORMAL HIGH (ref 70–99)

## 2017-11-22 LAB — CULTURE, BLOOD (ROUTINE X 2)
CULTURE: NO GROWTH
Culture: NO GROWTH
SPECIAL REQUESTS: ADEQUATE
SPECIAL REQUESTS: ADEQUATE

## 2017-11-22 MED ORDER — SORBITOL 70 % SOLN
30.0000 mL | Freq: Once | Status: AC
  Administered 2017-11-22: 30 mL via ORAL
  Filled 2017-11-22: qty 30

## 2017-11-22 MED ORDER — SORBITOL 70 % PO SOLN
30.0000 mL | Freq: Once | ORAL | Status: DC
  Filled 2017-11-22 (×2): qty 30

## 2017-11-22 MED ORDER — TORSEMIDE 20 MG PO TABS
20.0000 mg | ORAL_TABLET | Freq: Every day | ORAL | Status: DC
  Administered 2017-11-22 – 2017-11-23 (×2): 20 mg via ORAL
  Filled 2017-11-22 (×2): qty 1

## 2017-11-22 NOTE — Progress Notes (Signed)
Patient ID: Dillon Andrews, male   DOB: [birth year], 77 y.o.   MRN: [id number]     Advanced Heart Failure Rounding Note  PCP-Cardiologist: Glenetta Hew, MD   Subjective:    Remains on milrinone. PICC line removed for line holiday. Blood cultures NGTD. Weight stable.   Walked with PT. Recommending HHPT.  Feels weak but improving. No fevers or chills. No orthopnea or PND. Remains constiapted   I met with his family on 8/9 and he wants to continue aggressive care but DNR/DNI.   Objective:   Weight Range: 79.2 kg Body mass index is 25.77 kg/m.   Vital Signs:   Temp:  [97.9 F (36.6 C)-98.6 F (37 C)] 98.6 F (37 C) (08/10 0750) Pulse Rate:  [66-87] 87 (08/10 0750) Resp:  [13-30] 16 (08/10 0750) BP: (99-115)/(60-71) 102/64 (08/10 0750) SpO2:  [93 %-99 %] 97 % (08/10 0750) Weight:  [79.2 kg-79.5 kg] 79.2 kg (08/10 0500) Last BM Date: 11/21/17  Weight change: Filed Weights   11/21/17 0600 11/21/17 1312 11/22/17 0500  Weight: 79.7 kg 79.5 kg 79.2 kg    Intake/Output:   Intake/Output Summary (Last 24 hours) at 11/22/2017 0825 Last data filed at 11/22/2017 0450 Gross per 24 hour  Intake 539.4 ml  Output 490 ml  Net 49.4 ml      Physical Exam    General:  Sitting up in bed. No resp difficulty HEENT: normal Neck: supple. no JVD. Carotids 2+ bilat; no bruits. No lymphadenopathy or thryomegaly appreciated. Cor: PMI laterally displaced. Regular rate & rhythm. No rubs, gallops or murmurs. Lungs: clear Abdomen: soft, nontender, nondistended. No hepatosplenomegaly. No bruits or masses. Good bowel sounds. Extremities: no cyanosis, clubbing, rash, edema Neuro: alert & orientedx3, cranial nerves grossly intact. moves all 4 extremities w/o difficulty. Affect pleasant    Telemetry   NSR 60s. Personally reviewed.   EKG    No new tracings.   Labs    CBC Recent Labs    11/21/17 0528 11/22/17 0230  WBC 5.9 5.8  HGB 10.5* 10.2*  HCT 33.8* 33.1*  MCV 89.2 90.7    PLT 119* 397*   Basic Metabolic Panel Recent Labs    11/21/17 0729 11/22/17 0230  NA 139 137  K 4.5 4.3  CL 96* 95*  CO2 33* 34*  GLUCOSE 119* 115*  BUN 38* 29*  CREATININE 1.67* 1.50*  CALCIUM 9.0 8.7*   Liver Function Tests Recent Labs    11/20/17 0500  AST 33  ALT 36  ALKPHOS 190*  BILITOT 1.6*  PROT 5.8*  ALBUMIN 2.6*   No results for input(s): LIPASE, AMYLASE in the last 72 hours. Cardiac Enzymes No results for input(s): CKTOTAL, CKMB, CKMBINDEX, TROPONINI in the last 72 hours.  BNP: BNP (last 3 results) Recent Labs    06/25/17 1207 09/15/17 1401 11/05/17 1310  BNP 420.9* 393.0* 515.3*    ProBNP (last 3 results) No results for input(s): PROBNP in the last 8760 hours.   D-Dimer No results for input(s): DDIMER in the last 72 hours. Hemoglobin A1C No results for input(s): HGBA1C in the last 72 hours. Fasting Lipid Panel No results for input(s): CHOL, HDL, LDLCALC, TRIG, CHOLHDL, LDLDIRECT in the last 72 hours. Thyroid Function Tests Recent Labs    11/19/17 1128  TSH 1.381    Other results:   Imaging    No results found.   Medications:     Scheduled Medications: . amiodarone  200 mg Oral BID  . apixaban  5 mg  Oral BID  . bisacodyl  10 mg Oral Daily  . dronabinol  2.5 mg Oral BID AC  . famciclovir  250 mg Oral Daily  . feeding supplement (ENSURE ENLIVE)  237 mL Oral BID BM  . gabapentin  800 mg Oral QHS  . hydrocortisone   Rectal TID  . insulin aspart  0-15 Units Subcutaneous TID WC  . insulin aspart  0-5 Units Subcutaneous QHS  . insulin aspart  5 Units Subcutaneous TID WC  . LORazepam  1 mg Oral Once  . magnesium oxide  400 mg Oral Daily  . Melatonin  6 mg Oral QHS  . pantoprazole  40 mg Oral Daily  . polyethylene glycol  17 g Oral Daily  . rosuvastatin  10 mg Oral Daily  . senna-docusate  2 tablet Oral BID  . sertraline  25 mg Oral Daily  . sodium chloride flush  3 mL Intravenous Q12H  . Tafamidis Meglumine (Cardiac)  4  capsule Oral Daily  . traZODone  50 mg Oral QHS    Infusions: . sodium chloride Stopped (11/20/17 1640)  . milrinone 0.125 mcg/kg/min (11/21/17 1900)  . norepinephrine (LEVOPHED) Adult infusion Stopped (11/18/17 1326)    PRN Medications: sodium chloride, acetaminophen, bisacodyl, LORazepam, morphine injection, ondansetron (ZOFRAN) IV, simethicone, sodium chloride flush, zolpidem    Patient Profile   Dillon Andrews is a 77 y.o. male with a hx of chronic combined CHF due to NICM, CKD III,presumedNASH(per pt, never proven),esophageal varices per chart(prior glass of wine occasionally but no heavy alcohol),HTN, HLD, DM, Barrett's esophagus, GERD, anemia, RBBB, TTR amyloidosis, and Kappa Light Chain myeloma.  Admitted to [facility] 11/05/17 in setting of new Afib with RVR.   Assessment/Plan   1. Acute on chronic combined HF with TTR amyloidosis ([place]) - NICM by cath at [facility] 09/2016 with EF 40-45%. Echo 06/2017: EF 25-30%, mild LAE, mild/mod RV dilation, mod/severe RA. - PYP scan strongly suggestive of TTR amyloid, SPEP with 0.8% m-spike, UPEP with Bence Jones Protein positive, kappa type. 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 - Dr Marin Olp saw and thinks he does not have AL amyloid (just multiple myeloma) and that he has TTR amyloid and should be treated with tafamidis before chemo. Tafamidis started on 8/2.   Off milrinone 8/3. Milrinone restarted 8/7. Off Norep 8/6. - Volume status looks good today. Will restart torsemide 20 daily. (Was on 20 bid at home)  Creatinine 2.7>2.68> 2.06> 1.50 - Continue milrinone 0.125 mcg. Have decided on home milrinone and [facility] has been contacted - Continue to hold spiro.  - No ARB/entresto with for now with CKD - SBP soft 100s. Hold off on hydralazine/imdur  2. Sepsis - Afebrile. WBC normal - Has completed Vanc and Cefepine. - 8/5 Blood culture- NGTD - PICC removed. Repeat blood cultures done 8/9. Will need tunneled line for milrinone.  Ordered for Monday  3. New onset paroxysmal AFlutter  - likely triggered HF decompensation  - s/p TEE DCCV 7/29.   - Back in atrial flutter 7/31, repeat DCCV on 8/2. Converted back to aflutter 8/5. - Maintaining NSR - Continue  amio to 200 mg twice a day - Continue eliquis 2.5 mg twice a day. No bleeding  4. AKI on CKD III, baseline ~1.6 -Creatinine improved today with recovery from sepsis -Restart low-dose torsemie  5. Recurrent bilateral effusions - Somewhat increased from March on CXR 11/05/17. No change.   6. Kappa Lightchain Myeloma - Follows with Dr. Marin Olp who has seen  this admission.  - Started on Bortezomib7/18/18andRevlimid7/23/19. - Continue management per primary team. - Dr Marin Olp thinks he does not have AL amyloid and that he does have TTR amyloid and should be treated with tafamodis before chemo. Now on Tafamidis. No change.   7. NASH with esophageal varices.  - Watch closely for bleeding with AC. Hemoglobin stable 10.5. - Last EGD 06/21/2014 at [facility] for surveillance of Esophageal varices, Barrett's esophagus, and PMHx of Colon polyps. Showed two columns of small esophageal varices w/out stigmata, short segment Barrett's esophagus (without biopsies, due to presence of EVs), 5cm hiatal hernia, and hyperplastic small gastric nodules.   8. DM2 - Per primary. No change.   9. Hypokalemia. - BMET pending.    11. Constipation/Abdominal pain - KUB 8/5 negative for obstruction - Continue dulcolax daily. - Abdominal pain resolved.  - Abdominal US 8/5 negative for acute findings - Small BM yesterday. Still feels constipate. Repeat sorbitol this am. ambulate unit with RN and/or family 3-4x/day  12. DNR/DNI  Length of Stay: 17  Glori Bickers, MD  8:25 AM  Advanced Heart Failure Team Pager [phone] (M-F; 7a - 4p)  Please contact [facility] for night-coverage after hours (4p -7a ) and weekends on [email or web address]

## 2017-11-22 NOTE — Progress Notes (Signed)
PROGRESS NOTE    ADD DINAPOLI  CLE:[id number] DOB: Jun 28, 1940 DOA: 11/05/2017 PCP: [name], MD    Brief Narrative:  77 year old male with past medical history of liver cirrhosis with esophageal varices, combined systolic and diastolic CHF with ejection fraction of 25 to 30%, nonischemic cardiomyopathy, pulmonary hypertension, cardiac amyloidosis, CKD stage III, kappa light chain myeloma on chemotherapy was brought to the emergency department due to shortness of breath. Patient was found to be in A. fib with RVR with hypotension on presentation. Cardiology was consulted. Started on amiodarone drip. Also started on heparin for new onset A. fib. CHF team  Consulted ,  Underwent cardioversion on 7/29.    11/18/2017 patient was transferred to [facility] for close monitoring since he became hypotensive and had episode of bradycardia.  He was started on pressors briefly with improvement in his blood pressure.  lactic acid level 2.1 procalcitonin 0.44 up to 9.48.  he was started empirically on IV vancomycin and cefepime. Wbc count is wnl. Improving pro calcitonin level. Blood cultures are negative.   Palliative care team on board and following the patient.   11/22/2017: Cardiac work-up revealed TTR amyloid.  Tafamidis started. Also on Milrinone. Torsemide 20 mg po daily added today by Cardiology team.   Assessment & Plan:   Principal Problem:   Atrial fibrillation with RVR ([place]) Active Problems:   CKD (chronic kidney disease) stage 3, GFR 30-59 ml/min (HCC), baseline Cr 1.5   DM2 (diabetes mellitus, type 2) (HCC)   Acute on chronic combined systolic and diastolic CHF (congestive heart failure) (HCC)   Bilateral pleural effusion   Esophageal varices in cirrhosis (HCC)   SOB (shortness of breath)   Non-ischemic cardiomyopathy ([place])   Other secondary pulmonary hypertension (HCC)   RBBB   Cardiac amyloidosis (HCC)   Weakness generalized   Kappa light chain myeloma (HCC)  Leukocytosis   Elevated troponin   Elevated brain natriuretic peptide (BNP) level, acute on chronic   Hypotension   Palliative care by specialist   Cardiogenic shock ([place])  Acute on chronic systolic and diastolic heart failure and amyloidosis:  2D echo 06/2017 showed EF of 25 to 30% with mild LAE, mild to moderate RV dilation moderate to severe RA. PYP scan suggestive of TTR amyloid. , patient is followed by Dr Marin Olp saw and thinks he does not have AL amyloid (just multiple myeloma) and that he has TTR amyloid and should be treated with tafamidis before chemo Tafamidis started on 8/2. Milrinone started by heart failure team on 8/7.  Further management as per cardiology. Urine output about 875 overnight.  He is on RA with good oxygen sats, BP parameters borderline.  Pt reports dizziness on standing and on ambulation.   11/22/2017: Kindly see above.  Patient is stable.     Atrial fibrillation with RVR:  Status post TEE DCCV on 7/29, briefly returned to normal sinus rhythm  Back in atrial flutter 7/31, repeat DCCV on 8/2. Converted back to aflutter 8/5. CHADS VASC 6, continue Eliquis. Not an ablation candidate.  No new complaints of palpitations.    Liver cirrhosis:  No signs of bleeding.  Last EGD at [facility] showed esophageal varices and Barretts esophagus.  Follow up with Gi as outpatient , PPI.  Stable.   Kappa light chain myeloma:  Follow up Dr. Marin Olp, just had chemotherapy few days ago, was started on bortezomib on 10/20/2016 and Revlimid on 11/04/2017 -Dr. Marin Olp following  Now on Tafamidis.  Oncology input is highly appreciated.  Acute kidney injury on stage 3 CKD:  Baseline creatinine is around 1.6,  Suspect from hypotension, diuretics, spironolactone.  Improving.  2. 75> 2.68> 2 > 1.67.  Potassium around 4.3 today.  Serum creatinine is improving (1.5 today, 11/22/2017)  Diabetes mellitus: CBG (last 3)  Recent Labs    11/22/17 0827 11/22/17 1248 11/22/17 1655   GLUCAP 103* 152* 115*   Resume SSI. A1c is 7.5. No change in medications.   Sepsis  Unclear etiology.  ? picc line infecton. Blood cultures have been negative so far.  RUQ is negative for acute cholecystitis.  CXR shows resolution of the right effusion.  UA is unremarkable.  He is afebrile and wbc count normalized.   recheck pro calcitonin today. Will d/c antibiotics if the pro calcitonin is improving  11/22/2017: Sepsis seems to have resolved significantly. -Patient has completed a course of antibiotics.  DVT prophylaxis: Eliquis.  Code Status: FULL CODE.  Family Communication:  Disposition Plan: This will depend on hospital course.  Consultants:    oncology Dr Marin Olp  Heart failure Team. Dr Sung Amabile.   Palliative care consult.    Procedures: none.   Antimicrobials: vancomycin and cefepime.   Subjective: No shortness of breath. No chest pain. No fever or chills. No coughing   Objective: Vitals:   11/22/17 0500 11/22/17 0750 11/22/17 1127 11/22/17 1626  BP:  102/64 106/68 (!) 100/58  Pulse:  87 63 67  Resp:  16 (!) 21 (!) 22  Temp:  98.6 F (37 C) 98.5 F (36.9 C) 98.9 F (37.2 C)  TempSrc:  Oral Oral Oral  SpO2:  97% 98% 96%  Weight: 79.2 kg     Height:        Intake/Output Summary (Last 24 hours) at 11/22/2017 1800 Last data filed at 11/22/2017 1748 Gross per 24 hour  Intake 282.64 ml  Output 675 ml  Net -392.36 ml   Filed Weights   11/21/17 0600 11/21/17 1312 11/22/17 0500  Weight: 79.7 kg 79.5 kg 79.2 kg    Examination:  General exam: Appears calm and comfortable on RA. Not in any distress.  Respiratory system: Clear to auscultation.  Cardiovascular system: S1 & S2 heard, RRR.No pedal edema. Gastrointestinal system: Abdomen is soft non tender non distended,   Normal bowel sounds heard. Central nervous system: Alert and oriented. Non focal.  Patient moves all limbs. Extremities: No leg edema.   Psychiatry: . Mood & affect appropriate.     Data Reviewed: I have personally reviewed following labs and imaging studies  CBC: Recent Labs  Lab 11/18/17 1113 11/19/17 0401 11/20/17 0500 11/21/17 0528 11/22/17 0230  WBC 11.9* 6.3 6.6 5.9 5.8  HGB 11.6* 11.2* 10.5* 10.5* 10.2*  HCT 37.6* 36.2* 34.0* 33.8* 33.1*  MCV 90.4 90.0 89.9 89.2 90.7  PLT 130* 109* 107* 119* 284*   Basic Metabolic Panel: Recent Labs  Lab 11/16/17 0421  11/18/17 0409 11/19/17 0401 11/20/17 0500 11/21/17 0729 11/22/17 0230  NA 138   < > 134* 134* 139 139 137  K 3.6   < > 4.3 4.0 3.6 4.5 4.3  CL 92*   < > 89* 89* 94* 96* 95*  CO2 35*   < > 32 32 34* 33* 34*  GLUCOSE 122*   < > 180* 129* 127* 119* 115*  BUN 33*   < > 53* 71* 58* 38* 29*  CREATININE 1.78*   < > 2.75* 2.68* 2.06* 1.67* 1.50*  CALCIUM 9.0   < > 8.6* 8.6*  8.7* 9.0 8.7*  MG 2.0  --   --   --   --   --   --    < > = values in this interval not displayed.   GFR: Estimated Creatinine Clearance: 40.6 mL/min (A) (by C-G formula based on SCr of 1.5 mg/dL (H)). Liver Function Tests: Recent Labs  Lab 11/19/17 0401 11/20/17 0500  AST 37 33  ALT 39 36  ALKPHOS 206* 190*  BILITOT 1.7* 1.6*  PROT 6.0* 5.8*  ALBUMIN 2.7* 2.6*   No results for input(s): LIPASE, AMYLASE in the last 168 hours. Recent Labs  Lab 11/19/17 1128  AMMONIA 38*   Coagulation Profile: No results for input(s): INR, PROTIME in the last 168 hours. Cardiac Enzymes: No results for input(s): CKTOTAL, CKMB, CKMBINDEX, TROPONINI in the last 168 hours. BNP (last 3 results) No results for input(s): PROBNP in the last 8760 hours. HbA1C: No results for input(s): HGBA1C in the last 72 hours. CBG: Recent Labs  Lab 11/21/17 1716 11/21/17 2104 11/22/17 0827 11/22/17 1248 11/22/17 1655  GLUCAP 158* 97 103* 152* 115*   Lipid Profile: No results for input(s): CHOL, HDL, LDLCALC, TRIG, CHOLHDL, LDLDIRECT in the last 72 hours. Thyroid Function Tests: No results for input(s): TSH, T4TOTAL, FREET4, T3FREE,  THYROIDAB in the last 72 hours. Anemia Panel: No results for input(s): VITAMINB12, FOLATE, FERRITIN, TIBC, IRON, RETICCTPCT in the last 72 hours. Sepsis Labs: Recent Labs  Lab 11/17/17 1618 11/17/17 2016 11/18/17 0409 11/18/17 1602 11/19/17 0401 11/21/17 0853  PROCALCITON 0.44  --  9.48  --  7.80 1.52  LATICACIDVEN  --  2.1*  --  2.2*  --   --     Recent Results (from the past 240 hour(s))  Culture, blood (Routine X 2) w Reflex to ID Panel     Status: None   Collection Time: 11/17/17 11:16 AM  Result Value Ref Range Status   Specimen Description BLOOD LEFT ANTECUBITAL  Final   Special Requests   Final    BOTTLES DRAWN AEROBIC AND ANAEROBIC Blood Culture adequate volume   Culture   Final    NO GROWTH 5 DAYS Performed at [facility], [place] [address], [place], [place] [address]    Report Status 11/22/2017 FINAL  Final  Culture, blood (Routine X 2) w Reflex to ID Panel     Status: None   Collection Time: 11/17/17 11:22 AM  Result Value Ref Range Status   Specimen Description BLOOD LEFT HAND  Final   Special Requests   Final    BOTTLES DRAWN AEROBIC AND ANAEROBIC Blood Culture adequate volume   Culture   Final    NO GROWTH 5 DAYS Performed at [facility], [place] [address], [place], [place] [address]    Report Status 11/22/2017 FINAL  Final  MRSA PCR Screening     Status: None   Collection Time: 11/17/17  8:27 PM  Result Value Ref Range Status   MRSA by PCR NEGATIVE NEGATIVE Final    Comment:        The GeneXpert MRSA Assay (FDA approved for NASAL specimens only), is one component of a comprehensive MRSA colonization surveillance program. It is not intended to diagnose MRSA infection nor to guide or monitor treatment for MRSA infections. Performed at [facility], [place] [address], [place], [place] [address]   Culture, blood (routine x 2)     Status: None (Preliminary result)   Collection Time: 11/21/17  5:20 AM  Result Value Ref Range Status    Specimen Description BLOOD RIGHT ARM  Final   Special Requests   Final    BOTTLES DRAWN AEROBIC ONLY Blood Culture adequate volume   Culture   Final    NO GROWTH 1 DAY Performed at [facility], [address] [place], [place] [address]    Report Status PENDING  Incomplete  Culture, blood (routine x 2)     Status: None (Preliminary result)   Collection Time: 11/21/17  5:25 AM  Result Value Ref Range Status   Specimen Description BLOOD RIGHT ARM  Final   Special Requests   Final    BOTTLES DRAWN AEROBIC ONLY Blood Culture adequate volume   Culture   Final    NO GROWTH 1 DAY Performed at [facility], [place] [address] [place], [place] [address]    Report Status PENDING  Incomplete         Radiology Studies: No results found.      Scheduled Meds: . amiodarone  200 mg Oral BID  . apixaban  5 mg Oral BID  . bisacodyl  10 mg Oral Daily  . dronabinol  2.5 mg Oral BID AC  . famciclovir  250 mg Oral Daily  . feeding supplement (ENSURE ENLIVE)  237 mL Oral BID BM  . gabapentin  800 mg Oral QHS  . hydrocortisone   Rectal TID  . insulin aspart  0-15 Units Subcutaneous TID WC  . insulin aspart  0-5 Units Subcutaneous QHS  . insulin aspart  5 Units Subcutaneous TID WC  . LORazepam  1 mg Oral Once  . magnesium oxide  400 mg Oral Daily  . Melatonin  6 mg Oral QHS  . pantoprazole  40 mg Oral Daily  . polyethylene glycol  17 g Oral Daily  . rosuvastatin  10 mg Oral Daily  . senna-docusate  2 tablet Oral BID  . sertraline  25 mg Oral Daily  . sodium chloride flush  3 mL Intravenous Q12H  . Tafamidis Meglumine (Cardiac)  4 capsule Oral Daily  . torsemide  20 mg Oral Daily  . traZODone  50 mg Oral QHS   Continuous Infusions: . sodium chloride Stopped (11/20/17 1640)  . milrinone 0.125 mcg/kg/min (11/21/17 1900)     LOS: 17 days    Time spent: 25 Minutes.     Bonnell Public, MD [facility] Pager [phone]  If 7PM-7AM, please contact  night-coverage [email or web address] Password [email or web address] 11/22/2017, 6:00 PM

## 2017-11-22 NOTE — Progress Notes (Signed)
Pt walked hallways with Yvone Neu, RN. [id number]; "legs felt weak at the end of the walk" but otherwise tolerated well.   GGibraltar Lott Seelbach, RN

## 2017-11-23 LAB — BASIC METABOLIC PANEL
ANION GAP: 10 (ref 5–15)
BUN: 29 mg/dL — ABNORMAL HIGH (ref 8–23)
CO2: 34 mmol/L — ABNORMAL HIGH (ref 22–32)
Calcium: 8.6 mg/dL — ABNORMAL LOW (ref 8.9–10.3)
Chloride: 97 mmol/L — ABNORMAL LOW (ref 98–111)
Creatinine, Ser: 1.66 mg/dL — ABNORMAL HIGH (ref 0.61–1.24)
GFR calc Af Amer: 44 mL/min — ABNORMAL LOW (ref >60)
GFR calc non Af Amer: 38 mL/min — ABNORMAL LOW (ref >60)
GLUCOSE: 95 mg/dL (ref 70–99)
POTASSIUM: 4.2 mmol/L (ref 3.5–5.1)
Sodium: 141 mmol/L (ref 135–145)

## 2017-11-23 LAB — CBC
HEMATOCRIT: 33.1 % — AB (ref 39.0–52.0)
HEMATOCRIT: 32.5 % — AB (ref 39.0–52.0)
HEMOGLOBIN: 10 g/dL — AB (ref 13.0–17.0)
Hemoglobin: 10.1 g/dL — ABNORMAL LOW (ref 13.0–17.0)
MCH: 27.9 pg (ref 26.0–34.0)
MCH: 27.9 pg (ref 26.0–34.0)
MCHC: 30.5 g/dL (ref 30.0–36.0)
MCHC: 30.8 g/dL (ref 30.0–36.0)
MCV: 91.4 fL (ref 78.0–100.0)
MCV: 90.5 fL (ref 78.0–100.0)
Platelets: 127 10*3/uL — ABNORMAL LOW (ref 150–400)
Platelets: 122 10*3/uL — ABNORMAL LOW (ref 150–400)
RBC: 3.62 MIL/uL — AB (ref 4.22–5.81)
RBC: 3.59 MIL/uL — ABNORMAL LOW (ref 4.22–5.81)
RDW: 18 % — ABNORMAL HIGH (ref 11.5–15.5)
RDW: 18.1 % — ABNORMAL HIGH (ref 11.5–15.5)
WBC: 6.2 10*3/uL (ref 4.0–10.5)
WBC: 5.3 10*3/uL (ref 4.0–10.5)

## 2017-11-23 LAB — GLUCOSE, CAPILLARY
GLUCOSE-CAPILLARY: 215 mg/dL — AB (ref 70–99)
Glucose-Capillary: 97 mg/dL (ref 70–99)
Glucose-Capillary: 105 mg/dL — ABNORMAL HIGH (ref 70–99)
Glucose-Capillary: 169 mg/dL — ABNORMAL HIGH (ref 70–99)

## 2017-11-23 NOTE — Progress Notes (Signed)
Went to the bathroom, noted with dark red blood with some blood clots noted in mod amount in the toilet bowl. Latest hgb level-10. Continue to monitor.

## 2017-11-23 NOTE — Progress Notes (Signed)
Eliquis held tonight in preparation of tunneled cath procedure in AM. Shorr, PA aware and she placed order to hold Eliquis.Patient will remain NPO after midnight. Family and patient aware of procedure for tomorrow.

## 2017-11-23 NOTE — Progress Notes (Signed)
Encouraged to be in a chair but not ready at this time. Explained the benefits of getting out of bed.

## 2017-11-23 NOTE — Progress Notes (Signed)
PROGRESS NOTE    Dillon Andrews  BSJ:[id number] DOB: 1940-09-05 DOA: 11/05/2017 PCP: [name], MD    Brief Narrative:  77 year old male with past medical history of liver cirrhosis with esophageal varices, combined systolic and diastolic CHF with ejection fraction of 25 to 30%, nonischemic cardiomyopathy, pulmonary hypertension, cardiac amyloidosis, CKD stage III, kappa light chain myeloma on chemotherapy was brought to the emergency department due to shortness of breath. Patient was found to be in A. fib with RVR with hypotension on presentation. Cardiology was consulted. Started on amiodarone drip. Also started on heparin for new onset A. fib. CHF team consulted and continue to assist with management.  Underwent cardioversion on 7/29.   11/18/2017 patient was transferred to [facility] for close monitoring since he became hypotensive and had episode of bradycardia.  He was started on pressors briefly with improvement in his blood pressure. Lactic acid level 2.1 procalcitonin 0.44 up to 9.48.  he was started empirically on IV vancomycin and cefepime. Wbc count is wnl. Improving pro calcitonin level. Blood cultures are negative.   Palliative care team on board and following the patient.    Assessment & Plan:   Principal Problem:   Atrial fibrillation with RVR (HCC) Active Problems:   CKD (chronic kidney disease) stage 3, GFR 30-59 ml/min (HCC), baseline Cr 1.5   DM2 (diabetes mellitus, type 2) (HCC)   Acute on chronic combined systolic and diastolic CHF (congestive heart failure) (HCC)   Bilateral pleural effusion   Esophageal varices in cirrhosis (HCC)   SOB (shortness of breath)   Non-ischemic cardiomyopathy ([place])   Other secondary pulmonary hypertension (HCC)   RBBB   Cardiac amyloidosis (HCC)   Weakness generalized   Kappa light chain myeloma (HCC)   Leukocytosis   Elevated troponin   Elevated brain natriuretic peptide (BNP) level, acute on chronic   Hypotension  Palliative care by specialist   Cardiogenic shock ([place])  Acute on chronic systolic and diastolic heart failure and TTR amyloidosis:  NICM cath at [facility] 09/2016 with EF 40-45%.   2D echo 06/2017 showed EF of 25 to 30% with mild LAE, mild to moderate RV dilation moderate to severe RA.  PYP scan suggestive of TTR amyloid.   SPEP with 0.8% M spike, UPEP with Bence-Jones protein positive, kappa type.  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 SEEN by Dr Marin Olp saw and thinks he does not have AL amyloid (just multiple myeloma) and that he has TTR amyloid and should be treated with Tafamidis before chemo Tafamidis started on 8/2. Milrinone started by heart failure team on 8/7.  Further management as per cardiology.  Remains mildly volume overloaded.  Torsemide 20 mg daily started 8/10.  -7.79 L since admission.  Pressure on the soft side but asymptomatic today.  Creatinine fluctuating and slightly up compared to yesterday from 1.5 > 1.66.  Continue to trend BMP. He is on a line holiday, cultures are negative and plan is for tunneled catheter placement tomorrow morning for home milrinone. Holding Aldactone, no ARB/Entresto for now due to CKD.  Holding BiDil due to soft blood pressures.  New onset paroxysmal atrial flutter/fib with RVR. Status post TEE DCCV on 7/29, briefly returned to normal sinus rhythm  Back in atrial flutter 7/31, repeat DCCV on 8/2. Converted back to aflutter 8/5. CHADS VASC 6, continue Eliquis. Not an ablation candidate.  Continue amiodarone 200 mg twice daily. Having mild intermittent rectal bleeding related to hemorrhoids.  Continue Eliquis for now with close  monitoring for increased bleeding.  NASH Liver cirrhosis with esophageal varices:  Last EGD at [facility] showed esophageal varices and Barretts esophagus.  Follow up with Gi as outpatient , PPI.  Monitor closely for bleeding complications while on Eliquis.  Kappa light chain myeloma:  Follow up Dr. Marin Olp, just had  chemotherapy few days ago, was started on bortezomib on 10/30/2016 and Revlimid on 11/04/2017 -Dr. Marin Olp following. Now on Tafamidis for Cardiac Amyloid.   Acute kidney injury on stage 3 CKD:  Baseline creatinine is around 1.6,  Suspect from hypotension, diuretics, spironolactone.  Creatinine appears to be at baseline and fluctuating over the last 3 days in the 1.5-1.6 range.  Continue to monitor closely while on diuretics.  Type II DM with renal complications Z6X is 7.5.  Remains on SSI and mealtime NovoLog with reasonable inpatient control.  Continue management.  Sepsis  Unclear etiology.  ? picc line infecton.  Blood cultures x2 from 8/5: Negative, final report.  Blood cultures x2 from 8/9: Negative to date.  MRSA PCR negative. RUQ is negative for acute cholecystitis.  CXR shows resolution of the right effusion.  UA is unremarkable.  He is afebrile and wbc count normalized.  Patient completed 4 days course of IV cefepime and vancomycin which were stopped 8/8.  Stable.    Normocytic anemia May be related to chronic kidney disease or anemia of chronic disease.  Stable.  Thrombocytopenia:  Stable.  Rectal bleeding/hemorrhoids. Patient reports history of hemorrhoidal bleeding.  Likely due to this in the context of constipation.  Had slightly more bleeding this morning in the context of a large BM.  Hemodynamically stable and hemoglobin stable.  Cardiology checking repeat CBC this p.m. to confirm no significant fall. Patient and family report colonoscopy couple years ago that showed colon polyps. Continue bowel regimen and Anusol HC    DVT prophylaxis: Eliquis.  Code Status: FULL CODE.  Family Communication: Discussed in detail with patient's daughter at bedside.  Updated care and answered questions. Disposition Plan: To be determined pending clinical improvement and cardiology recommendations.  Consultants:   Oncology Dr Marin Olp  Heart failure Team. Dr Sung Amabile.    Palliative care consult.   Procedures:   none.   Antimicrobials:  Off antibiotics since 8/8  Subjective: Overnight events noted.  Patient interviewed and examined with daughter at bedside.  Seen sitting up in eating breakfast comfortably.  Reported couple days history of small amount of painless rectal bleeding "about a teaspoon" but this morning noted a large patch of blood on bed pad and had a large soft BM.  No rectal or abdominal pain.  Did not note stool color.  No dizziness, lightheadedness, chest pain or dyspnea.   Objective: Vitals:   11/22/17 2349 11/23/17 0430 11/23/17 0735 11/23/17 1100  BP: (!) 94/53 (!) 92/58 100/61 99/65  Pulse: 66 66 73 61  Resp: 14 20 (!) 23 (!) 22  Temp: 98.6 F (37 C) 98.5 F (36.9 C)  98.9 F (37.2 C)  TempSrc: Oral Oral  Oral  SpO2: 98% 98% 95% 96%  Weight:  78.9 kg    Height:        Intake/Output Summary (Last 24 hours) at 11/23/2017 1113 Last data filed at 11/23/2017 1100 Gross per 24 hour  Intake 915.16 ml  Output 350 ml  Net 565.16 ml   Filed Weights   11/21/17 1312 11/22/17 0500 11/23/17 0430  Weight: 79.5 kg 79.2 kg 78.9 kg    Examination:  General exam: Pleasant elderly male,  moderately built and nourished, sitting up comfortably in bed eating breakfast this morning. Respiratory system: Occasional basal crackles but otherwise clear to auscultation.  No increased work of breathing. Cardiovascular system: S1 and S2 heard, RRR.  No JVD, murmurs and trace ankle edema present.  Telemetry personally reviewed and showed SR with BBB morphology. Gastrointestinal system: Abdomen is soft non tender non distended,   Normal bowel sounds heard.  Stable. Central nervous system: Alert and oriented. Non focal.  Stable. Extremities: Moves all limbs symmetrically. Psychiatry: . Mood & affect appropriate.   Data Reviewed: I have personally reviewed following labs and imaging studies  CBC: Recent Labs  Lab 11/19/17 0401 11/20/17 0500  11/21/17 0528 11/22/17 0230 11/23/17 0718  WBC 6.3 6.6 5.9 5.8 6.2  HGB 11.2* 10.5* 10.5* 10.2* 10.1*  HCT 36.2* 34.0* 33.8* 33.1* 33.1*  MCV 90.0 89.9 89.2 90.7 91.4  PLT 109* 107* 119* 126* 937*   Basic Metabolic Panel: Recent Labs  Lab 11/19/17 0401 11/20/17 0500 11/21/17 0729 11/22/17 0230 11/23/17 0718  NA 134* 139 139 137 141  K 4.0 3.6 4.5 4.3 4.2  CL 89* 94* 96* 95* 97*  CO2 32 34* 33* 34* 34*  GLUCOSE 129* 127* 119* 115* 95  BUN 71* 58* 38* 29* 29*  CREATININE 2.68* 2.06* 1.67* 1.50* 1.66*  CALCIUM 8.6* 8.7* 9.0 8.7* 8.6*   GFR: Estimated Creatinine Clearance: 36.7 mL/min (A) (by C-G formula based on SCr of 1.66 mg/dL (H)). Liver Function Tests: Recent Labs  Lab 11/19/17 0401 11/20/17 0500  AST 37 33  ALT 39 36  ALKPHOS 206* 190*  BILITOT 1.7* 1.6*  PROT 6.0* 5.8*  ALBUMIN 2.7* 2.6*    Recent Labs  Lab 11/19/17 1128  AMMONIA 38*   CBG: Recent Labs  Lab 11/22/17 0827 11/22/17 1248 11/22/17 1655 11/22/17 2108 11/23/17 0818  GLUCAP 103* 152* 115* 153* 97     Recent Results (from the past 240 hour(s))  Culture, blood (Routine X 2) w Reflex to ID Panel     Status: None   Collection Time: 11/17/17 11:16 AM  Result Value Ref Range Status   Specimen Description BLOOD LEFT ANTECUBITAL  Final   Special Requests   Final    BOTTLES DRAWN AEROBIC AND ANAEROBIC Blood Culture adequate volume   Culture   Final    NO GROWTH 5 DAYS Performed at [facility], [place] [address], [place], [place] [address]    Report Status 11/22/2017 FINAL  Final  Culture, blood (Routine X 2) w Reflex to ID Panel     Status: None   Collection Time: 11/17/17 11:22 AM  Result Value Ref Range Status   Specimen Description BLOOD LEFT HAND  Final   Special Requests   Final    BOTTLES DRAWN AEROBIC AND ANAEROBIC Blood Culture adequate volume   Culture   Final    NO GROWTH 5 DAYS Performed at [facility], [place] [address] [place], [place] [address]    Report  Status 11/22/2017 FINAL  Final  MRSA PCR Screening     Status: None   Collection Time: 11/17/17  8:27 PM  Result Value Ref Range Status   MRSA by PCR NEGATIVE NEGATIVE Final    Comment:        The GeneXpert MRSA Assay (FDA approved for NASAL specimens only), is one component of a comprehensive MRSA colonization surveillance program. It is not intended to diagnose MRSA infection nor to guide or monitor treatment for MRSA  infections. Performed at [facility], [place] [address] [place], [place] [address]   Culture, blood (routine x 2)     Status: None (Preliminary result)   Collection Time: 11/21/17  5:20 AM  Result Value Ref Range Status   Specimen Description BLOOD RIGHT ARM  Final   Special Requests   Final    BOTTLES DRAWN AEROBIC ONLY Blood Culture adequate volume   Culture   Final    NO GROWTH 1 DAY Performed at [facility], [place] [address] [place], [place] [address]    Report Status PENDING  Incomplete  Culture, blood (routine x 2)     Status: None (Preliminary result)   Collection Time: 11/21/17  5:25 AM  Result Value Ref Range Status   Specimen Description BLOOD RIGHT ARM  Final   Special Requests   Final    BOTTLES DRAWN AEROBIC ONLY Blood Culture adequate volume   Culture   Final    NO GROWTH 1 DAY Performed at [facility], [place] [address] [place], [place] [address]    Report Status PENDING  Incomplete         Radiology Studies: No results found.      Scheduled Meds: . amiodarone  200 mg Oral BID  . apixaban  5 mg Oral BID  . bisacodyl  10 mg Oral Daily  . dronabinol  2.5 mg Oral BID AC  . famciclovir  250 mg Oral Daily  . feeding supplement (ENSURE ENLIVE)  237 mL Oral BID BM  . gabapentin  800 mg Oral QHS  . hydrocortisone   Rectal TID  . insulin aspart  0-15 Units Subcutaneous TID WC  . insulin aspart  0-5 Units Subcutaneous QHS  . insulin aspart  5 Units Subcutaneous TID WC  . LORazepam  1 mg Oral Once  . magnesium oxide   400 mg Oral Daily  . Melatonin  6 mg Oral QHS  . pantoprazole  40 mg Oral Daily  . polyethylene glycol  17 g Oral Daily  . rosuvastatin  10 mg Oral Daily  . senna-docusate  2 tablet Oral BID  . sertraline  25 mg Oral Daily  . sodium chloride flush  3 mL Intravenous Q12H  . Tafamidis Meglumine (Cardiac)  4 capsule Oral Daily  . torsemide  20 mg Oral Daily  . traZODone  50 mg Oral QHS   Continuous Infusions: . sodium chloride Stopped (11/20/17 1640)  . milrinone 0.125 mcg/kg/min (11/22/17 2111)     LOS: 18 days    Time spent: 35 Minutes.     Vernell Leep, MD, FACP, [facility]. [facility] Pager [phone]  If 7PM-7AM, please contact night-coverage [email or web address] Password [facility] 11/23/2017, 11:34 AM

## 2017-11-23 NOTE — Progress Notes (Addendum)
Patient awaken for weight and vitals this am. When patient stood up. Large amount of blood noted to underwear and bedpad. Patient proceeded to have a bowel movement this am on toilet. Tolerated well. But reports bright red blood after wiping bowel movment.  No c/o pain to rectal area. Has history of hemorroids and is currently being treated with anusol cream as needed. Daughter at bedside says that he did not have that much blood on his pad on yesterday," it was small drops of blood." stated daughter. Hgb at 0230 noted. VSS. Patient says that he does feel weaker this morning. No c/o dizziness or lightheadness.   MD paged for Juluis Rainier, Awaiting MD call back.

## 2017-11-23 NOTE — Progress Notes (Signed)
Patient ID: Dillon Andrews, male   DOB: 1940-09-25, 77 y.o.   MRN: [id number]     Advanced Heart Failure Rounding Note  PCP-Cardiologist: Glenetta Hew, MD   Subjective:    Remains on milrinone. PICC line removed for line holiday. Blood cultures NGTD. Weight stable.   Walked with PT. Recommending HHPT.  Feels ok this morning, no dyspnea.  No lightheadedness.  He has history of hemorrhoids with periodic rectal bleeding.  Had large BM this morning with more rectal bleeding noted than typical.  Hgb stable at 10.1.    Objective:   Weight Range: 78.9 kg Body mass index is 25.69 kg/m.   Vital Signs:   Temp:  [98.5 F (36.9 C)-99 F (37.2 C)] 98.5 F (36.9 C) (08/11 0430) Pulse Rate:  [63-73] 73 (08/11 0735) Resp:  [14-23] 23 (08/11 0735) BP: (92-106)/(53-68) 100/61 (08/11 0735) SpO2:  [95 %-98 %] 95 % (08/11 0735) Weight:  [78.9 kg] 78.9 kg (08/11 0430) Last BM Date: 11/22/17  Weight change: Filed Weights   11/21/17 1312 11/22/17 0500 11/23/17 0430  Weight: 79.5 kg 79.2 kg 78.9 kg    Intake/Output:   Intake/Output Summary (Last 24 hours) at 11/23/2017 1016 Last data filed at 11/23/2017 1000 Gross per 24 hour  Intake 678.16 ml  Output 350 ml  Net 328.16 ml      Physical Exam    General: NAD, frail Neck: JVP 8-9 cm, no thyromegaly or thyroid nodule.  Lungs: Clear to auscultation bilaterally with normal respiratory effort. CV: Nondisplaced PMI.  Heart regular S1/S2, no S3/S4, no murmur.  No peripheral edema.   Abdomen: Soft, nontender, no hepatosplenomegaly, no distention.  Skin: Intact without lesions or rashes.  Neurologic: Alert and oriented x 3.  Psych: Normal affect. Extremities: No clubbing or cyanosis.  HEENT: Normal.    Telemetry   NSR 60s-70s. Personally reviewed.   EKG    No new tracings.   Labs    CBC Recent Labs    11/22/17 0230 11/23/17 0718  WBC 5.8 6.2  HGB 10.2* 10.1*  HCT 33.1* 33.1*  MCV 90.7 91.4  PLT 126* 127*   Basic  Metabolic Panel Recent Labs    11/22/17 0230 11/23/17 0718  NA 137 141  K 4.3 4.2  CL 95* 97*  CO2 34* 34*  GLUCOSE 115* 95  BUN 29* 29*  CREATININE 1.50* 1.66*  CALCIUM 8.7* 8.6*   Liver Function Tests No results for input(s): AST, ALT, ALKPHOS, BILITOT, PROT, ALBUMIN in the last 72 hours. No results for input(s): LIPASE, AMYLASE in the last 72 hours. Cardiac Enzymes No results for input(s): CKTOTAL, CKMB, CKMBINDEX, TROPONINI in the last 72 hours.  BNP: BNP (last 3 results) Recent Labs    06/25/17 1207 09/15/17 1401 11/05/17 1310  BNP 420.9* 393.0* 515.3*    ProBNP (last 3 results) No results for input(s): PROBNP in the last 8760 hours.   D-Dimer No results for input(s): DDIMER in the last 72 hours. Hemoglobin A1C No results for input(s): HGBA1C in the last 72 hours. Fasting Lipid Panel No results for input(s): CHOL, HDL, LDLCALC, TRIG, CHOLHDL, LDLDIRECT in the last 72 hours. Thyroid Function Tests No results for input(s): TSH, T4TOTAL, T3FREE, THYROIDAB in the last 72 hours.  Invalid input(s): FREET3  Other results:   Imaging    No results found.   Medications:     Scheduled Medications: . amiodarone  200 mg Oral BID  . apixaban  5 mg Oral BID  . bisacodyl  10 mg Oral Daily  . dronabinol  2.5 mg Oral BID AC  . famciclovir  250 mg Oral Daily  . feeding supplement (ENSURE ENLIVE)  237 mL Oral BID BM  . gabapentin  800 mg Oral QHS  . hydrocortisone   Rectal TID  . insulin aspart  0-15 Units Subcutaneous TID WC  . insulin aspart  0-5 Units Subcutaneous QHS  . insulin aspart  5 Units Subcutaneous TID WC  . LORazepam  1 mg Oral Once  . magnesium oxide  400 mg Oral Daily  . Melatonin  6 mg Oral QHS  . pantoprazole  40 mg Oral Daily  . polyethylene glycol  17 g Oral Daily  . rosuvastatin  10 mg Oral Daily  . senna-docusate  2 tablet Oral BID  . sertraline  25 mg Oral Daily  . sodium chloride flush  3 mL Intravenous Q12H  . Tafamidis Meglumine  (Cardiac)  4 capsule Oral Daily  . torsemide  20 mg Oral Daily  . traZODone  50 mg Oral QHS    Infusions: . sodium chloride Stopped (11/20/17 1640)  . milrinone 0.125 mcg/kg/min (11/22/17 2111)    PRN Medications: sodium chloride, acetaminophen, bisacodyl, LORazepam, morphine injection, ondansetron (ZOFRAN) IV, simethicone, sodium chloride flush, zolpidem    Patient Profile   JESSEN SIEGMAN is a 77 y.o. male with a hx of chronic combined CHF due to NICM, CKD III,presumedNASH(per pt, never proven),esophageal varices per chart(prior glass of wine occasionally but no heavy alcohol),HTN, HLD, DM, Barrett's esophagus, GERD, anemia, RBBB, TTR amyloidosis, and Kappa Light Chain myeloma.  Admitted to [facility] 11/05/17 in setting of new Afib with RVR.   Assessment/Plan   1. Acute on chronic combined HF with TTR amyloidosis (Wild Type) - NICM by cath at [facility] 09/2016 with EF 40-45%. Echo 06/2017: EF 25-30%, mild LAE, mild/mod RV dilation, mod/severe RA. - PYP scan strongly suggestive of TTR amyloid, SPEP with 0.8% m-spike, UPEP with Bence Jones Protein positive, kappa type. 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 - Dr Marin Olp saw and thinks he does not have AL amyloid (just multiple myeloma) and that he has TTR amyloid and should be treated with tafamidis before chemo. Tafamidis started on 8/2.   Off milrinone 8/3. Milrinone restarted 8/7. Off Norep 8/6. - Mild volume overload, continue torsemide 20 mg daily (started yesterday).  Creatinine 2.7>2.68> 2.06> 1.50 > 1.6 - Continue milrinone 0.125 mcg. Have decided on home milrinone and [facility] has been contacted.  He will have tunneled catheter placed tomorrow morning, ordered.  - Continue to hold spiro.  - No ARB/entresto with for now with CKD - SBP soft 100s. Hold off on hydralazine/imdur  2. Sepsis - Afebrile. WBC normal - Has completed Vanc and Cefepine. - 8/5 Blood culture- NGTD - PICC removed. Repeat blood cultures done 8/9. Will  need tunneled line for milrinone. Ordered for Monday  3. New onset paroxysmal AFlutter  - likely triggered HF decompensation  - s/p TEE DCCV 7/29.   - Back in atrial flutter 7/31, repeat DCCV on 8/2. Converted back to aflutter 8/5. - Maintaining NSR - Continue  amio to 200 mg twice a day - Continue eliquis 2.5 mg twice a day.   4. AKI on CKD III, baseline ~1.6 -Creatinine improved today with recovery from sepsis -Back on torsemide 20 mg daily.   5. Recurrent bilateral effusions - Somewhat increased from March on CXR 11/05/17. No change.   6. [place] with Dr. Marin Olp  who has seen this admission.  - Started on Bortezomib7/18/18andRevlimid7/23/19. - Continue management per primary team. - Dr Marin Olp thinks he does not have AL amyloid and that he does have TTR amyloid and should be treated with tafamodis before chemo. Now on Tafamidis. No change.   7. NASH with esophageal varices.  - Watch closely for bleeding with AC. Hemoglobin stable 10.5. - Last EGD 06/21/2014 at [facility] for surveillance of Esophageal varices, Barrett's esophagus, and PMHx of Colon polyps. Showed two columns of small esophageal varices w/out stigmata, short segment Barrett's esophagus (without biopsies, due to presence of EVs), 5cm hiatal hernia, and hyperplastic small gastric nodules.   8. DM2 - Per primary. No change.   9. Hypokalemia. - BMET pending.    11. Rectal bleeding - History of hemorrhoids, more bleeding than typical for him this morning but with large BM.  Hgb stable at 10.1.  - Continue Eliquis for now but will repeat CBC in pm to confirm no significant fall.   12. DNR/DNI  Length of Stay: [place], MD  10:16 AM  Advanced Heart Failure Team Pager [phone] (M-F; 7a - 4p)  Please contact [facility] for night-coverage after hours (4p -7a ) and weekends on [email or web address]

## 2017-11-23 NOTE — Progress Notes (Signed)
Still with hemorrhoidal bleeding bright red, in mod amount. Cleansed , under wear changed and sanitary napkin in used. Continue to monitor.

## 2017-11-24 ENCOUNTER — Encounter (HOSPITAL_COMMUNITY): Payer: Medicare HMO

## 2017-11-24 DIAGNOSIS — N183 Chronic kidney disease, stage 3 (moderate): Secondary | ICD-10-CM | POA: Diagnosis not present

## 2017-11-24 DIAGNOSIS — I5043 Acute on chronic combined systolic (congestive) and diastolic (congestive) heart failure: Secondary | ICD-10-CM | POA: Diagnosis not present

## 2017-11-24 DIAGNOSIS — E119 Type 2 diabetes mellitus without complications: Secondary | ICD-10-CM | POA: Diagnosis not present

## 2017-11-24 DIAGNOSIS — K625 Hemorrhage of anus and rectum: Secondary | ICD-10-CM

## 2017-11-24 DIAGNOSIS — Z7901 Long term (current) use of anticoagulants: Secondary | ICD-10-CM

## 2017-11-24 LAB — CBC
HCT: 35.4 % — ABNORMAL LOW (ref 39.0–52.0)
HCT: 29.7 % — ABNORMAL LOW (ref 39.0–52.0)
HEMATOCRIT: 30.9 % — AB (ref 39.0–52.0)
HEMOGLOBIN: 9.1 g/dL — AB (ref 13.0–17.0)
Hemoglobin: 9.4 g/dL — ABNORMAL LOW (ref 13.0–17.0)
Hemoglobin: 10.7 g/dL — ABNORMAL LOW (ref 13.0–17.0)
MCH: 27.4 pg (ref 26.0–34.0)
MCH: 27.5 pg (ref 26.0–34.0)
MCH: 27.9 pg (ref 26.0–34.0)
MCHC: 30.4 g/dL (ref 30.0–36.0)
MCHC: 30.2 g/dL (ref 30.0–36.0)
MCHC: 30.6 g/dL (ref 30.0–36.0)
MCV: 90.1 fL (ref 78.0–100.0)
MCV: 91 fL (ref 78.0–100.0)
MCV: 91.1 fL (ref 78.0–100.0)
Platelets: 134 10*3/uL — ABNORMAL LOW (ref 150–400)
Platelets: 176 K/uL (ref 150–400)
Platelets: 157 10*3/uL (ref 150–400)
RBC: 3.43 MIL/uL — ABNORMAL LOW (ref 4.22–5.81)
RBC: 3.89 MIL/uL — ABNORMAL LOW (ref 4.22–5.81)
RBC: 3.26 MIL/uL — AB (ref 4.22–5.81)
RDW: 18.1 % — AB (ref 11.5–15.5)
RDW: 18.3 % — ABNORMAL HIGH (ref 11.5–15.5)
RDW: 18.3 % — ABNORMAL HIGH (ref 11.5–15.5)
WBC: 5.6 10*3/uL (ref 4.0–10.5)
WBC: 6.7 K/uL (ref 4.0–10.5)
WBC: 4.9 10*3/uL (ref 4.0–10.5)

## 2017-11-24 LAB — GLUCOSE, CAPILLARY
Glucose-Capillary: 105 mg/dL — ABNORMAL HIGH (ref 70–99)
Glucose-Capillary: 140 mg/dL — ABNORMAL HIGH (ref 70–99)
Glucose-Capillary: 164 mg/dL — ABNORMAL HIGH (ref 70–99)
Glucose-Capillary: 170 mg/dL — ABNORMAL HIGH (ref 70–99)
Glucose-Capillary: 44 mg/dL — CL (ref 70–99)
Glucose-Capillary: 56 mg/dL — ABNORMAL LOW (ref 70–99)
Glucose-Capillary: 69 mg/dL — ABNORMAL LOW (ref 70–99)
Glucose-Capillary: 99 mg/dL (ref 70–99)

## 2017-11-24 LAB — BASIC METABOLIC PANEL
Anion gap: 9 (ref 5–15)
BUN: 24 mg/dL — ABNORMAL HIGH (ref 8–23)
CO2: 36 mmol/L — AB (ref 22–32)
Calcium: 8.3 mg/dL — ABNORMAL LOW (ref 8.9–10.3)
Chloride: 96 mmol/L — ABNORMAL LOW (ref 98–111)
Creatinine, Ser: 1.53 mg/dL — ABNORMAL HIGH (ref 0.61–1.24)
GFR calc Af Amer: 48 mL/min — ABNORMAL LOW (ref >60)
GFR, EST NON AFRICAN AMERICAN: 42 mL/min — AB (ref >60)
GLUCOSE: 126 mg/dL — AB (ref 70–99)
POTASSIUM: 3.5 mmol/L (ref 3.5–5.1)
Sodium: 141 mmol/L (ref 135–145)

## 2017-11-24 LAB — ABO/RH: ABO/RH(D): A POS

## 2017-11-24 MED ORDER — SODIUM CHLORIDE 0.9 % IV BOLUS
500.0000 mL | Freq: Once | INTRAVENOUS | Status: AC
  Administered 2017-11-24: 500 mL via INTRAVENOUS

## 2017-11-24 NOTE — Progress Notes (Signed)
  Transitions of Care Polypharmacy Medication Deprescribing Review  Dillon Andrews is a 77 y.o. male admitted for CHF exacerbation and new onset Afib with RVR on 11/05/2017.   The patient's PTA medications and comorbidities were reviewed to identify opportunities for medication optimization through deprescribing.   Short-Term Recommendations: . Consider benefit of continuing vs discontinuing rosuvastatin if patient desires decreasing number of daily medications.   Long-Term Recommendations: . If BP remains low, consider decreasing carvedilol to 3.125 mg BID   Patient Assessment:  . Extent of Polypharmacy: 19 PTA medications, hyperpolypharmacy (10+ meds) o Associated risk: severe o Number of home Rx meds: 15 o Number of home OTC meds: 4 . Comorbidity Polypharmacy Score: 35 o Associated risk: morbid . Charlson Comorbidity Index: 10 o Associated risk: morbid (0% estimated 10-year survival) . GerontoNet Adverse Drug Event Risk Score: 8 o Associated risk: severe (~28% risk ADE)  Overall risk: high; significant risk from comorbidities and high level of hyperpolypharmacy    Medication Assessment:  . Potentially Inappropriate Medications (PIMs)  o High risk medications: 1 - Zolpidem  o Moderate risk medications: 2 - Omeprazole, torsemide o Low risk medications: 2 - Dexamethasone, rosuvastatin . Medications Associated with Geriatric Syndromes (MAGS): 5 o Carvedilol, dexamethasone, omeprazole, torsemide, zolpidem  Overall risk: moderate; medications are already low doses, mostly necessary/appropriate for comorbidities   The patient was interviewed to determine his interest in making medication changes. Shared decision-making was used to determine the optimal medications to deprescribe.   Patient Deprescribing Readiness: . Patient Opinions About Deprescribing survey score: 12 o Score of 4-8: negative patient opinion of deprescribing; deprescribing likely to be  unsuccessful o Score of 9-11: intermediate patient opinion of deprescribing; deprescribing may be successful o Score of 12-16: positive patient opinion of deprescribing; deprescribing likely to be successful   Shared Decision-Making Discussion:  . Patient expressed strong interest in deprescribing, indicating that he felt like he was taking too many medications. He denied any specific medication concerns, noting only that his main issue has been generalized nausea/lack of appetite and he wonders if any of his medications are contributing to that. . Patient able to recall information about medication uses when prompted, and daughters provide additional information about medication history. . Patient reported using combination of melatonin, trazodone, and zolpidem to sleep each night. Patient was advised of the risks with zolpidem but reported this combination of medications has been the only solution to his insomnia.   Please see above for final recommendations.  Oralia Manis, Student-PharmD 11/24/2017 3:16 PM

## 2017-11-24 NOTE — Progress Notes (Signed)
PROGRESS NOTE    Dillon Andrews  OJJ:[id number] DOB: 27-Feb-1941 DOA: 11/05/2017 PCP: [name], MD    Brief Narrative:  77 year old male with past medical history of liver cirrhosis with esophageal varices, combined systolic and diastolic CHF with ejection fraction of 25 to 30%, nonischemic cardiomyopathy, pulmonary hypertension, cardiac amyloidosis, CKD stage III, kappa light chain myeloma on chemotherapy was brought to the emergency department due to shortness of breath. Patient was found to be in A. fib with RVR with hypotension on presentation. Cardiology was consulted. Started on amiodarone drip. Also started on heparin for new onset A. fib. CHF team consulted and continue to assist with management.  Underwent cardioversion on 7/29.   11/18/2017 patient was transferred to [facility] for close monitoring since he became hypotensive and had episode of bradycardia.  He was started on pressors briefly with improvement in his blood pressure. Lactic acid level 2.1 procalcitonin 0.44 up to 9.48.  he was started empirically on IV vancomycin and cefepime. Wbc count is wnl. Improving pro calcitonin level. Blood cultures are negative. Palliative care team on board and following the patient.   11/24/2017: Hospital course complicated by worsening rectal bleeding.  Eliquis held.  [facility] consulted.   Assessment & Plan:   Principal Problem:   Atrial fibrillation with RVR (HCC) Active Problems:   CKD (chronic kidney disease) stage 3, GFR 30-59 ml/min (HCC), baseline Cr 1.5   DM2 (diabetes mellitus, type 2) (HCC)   Acute on chronic combined systolic and diastolic CHF (congestive heart failure) (HCC)   Bilateral pleural effusion   Esophageal varices in cirrhosis (HCC)   SOB (shortness of breath)   Non-ischemic cardiomyopathy ([place])   Other secondary pulmonary hypertension (HCC)   RBBB   Cardiac amyloidosis (HCC)   Weakness generalized   Kappa light chain myeloma (HCC)   Leukocytosis  Elevated troponin   Elevated brain natriuretic peptide (BNP) level, acute on chronic   Hypotension   Palliative care by specialist   Cardiogenic shock ([place])  Acute on chronic systolic and diastolic heart failure and TTR amyloidosis:  NICM cath at [facility] 09/2016 with EF 40-45%.   2D echo 06/2017 showed EF of 25 to 30% with mild LAE, mild to moderate RV dilation moderate to severe RA.  PYP scan suggestive of TTR amyloid.   SPEP with 0.8% M spike, UPEP with Bence-Jones protein positive, kappa type.  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 SEEN by Dr Marin Olp saw and thinks he does not have AL amyloid (just multiple myeloma) and that he has TTR amyloid and should be treated with Tafamidis before chemo Tafamidis started on 8/2. Milrinone started by heart failure team on 8/7.  Further management as per cardiology.  Remains mildly volume overloaded.  Torsemide 20 mg daily started 8/10.  -7.79 L since admission.  Pressure on the soft side but asymptomatic today.  Creatinine fluctuating and slightly up compared to yesterday from 1.5 > 1.66.  Continue to trend BMP. He is on a line holiday, cultures are negative and plan is for tunneled catheter placement (postponed 8/12 due to worsening rectal bleeding) for home milrinone. Holding Aldactone, no ARB/Entresto for now due to CKD.  Holding BiDil due to soft blood pressures. Holding torsemide today due to rectal bleeding and symptomatic/dizziness.  New onset paroxysmal atrial flutter/fib with RVR. Status post TEE DCCV on 7/29, briefly returned to normal sinus rhythm  Back in atrial flutter 7/31, repeat DCCV on 8/2. Converted back to aflutter 8/5. CHADS VASC 6,  continue Eliquis. Not an ablation candidate.  Continue amiodarone 200 mg twice daily. Having worsening rectal bleeding since overnight 8/11.  Eliquis held.  NASH Liver cirrhosis with esophageal varices:  Last EGD at [facility] showed esophageal varices and Barretts esophagus.  Follow up with Gi as  outpatient , PPI.  [facility] consulted 8/12 due to worsening rectal bleeding which appears lower GI but variceal bleed is a possibility although felt less likely.  Kappa light chain myeloma:  Follow up Dr. Marin Olp, just had chemotherapy few days ago, was started on bortezomib on 10/30/2016 and Revlimid on 11/04/2017 -Dr. Marin Olp following. Now on Tafamidis for Cardiac Amyloid.   Acute kidney injury on stage 3 CKD:  Baseline creatinine is around 1.6,  Suspect from hypotension, diuretics, spironolactone.  Creatinine appears to be at baseline and fluctuating over the last 3 days in the 1.5-1.6 range.  Continue to monitor closely while on diuretics.  Stable.  Type II DM with renal complications M0N is 7.5.  Remains on SSI and mealtime NovoLog with reasonable inpatient control.  Continue management.  Sepsis  Unclear etiology.  ? picc line infecton.  Blood cultures x2 from 8/5: Negative, final report.  Blood cultures x2 from 8/9: Negative to date.  MRSA PCR negative. RUQ is negative for acute cholecystitis.  CXR shows resolution of the right effusion.  UA is unremarkable.  He is afebrile and wbc count normalized.  Patient completed 4 days course of IV cefepime and vancomycin which were stopped 8/8.  Stable.    Normocytic anemia May be related to chronic kidney disease or anemia of chronic disease.  Hemoglobin repeated and stable in the 10 g range.  Thereby suspect mild rectal bleeding, possibly hemorrhoidal.  Thrombocytopenia:  Resolved on 8/12 labs.  Follow CBC.  Rectal bleeding/hemorrhoids. Patient reports history of hemorrhoidal ble patient had mild rectal bleeding for the last couple of days, this worsened in the last 24 hours more so overnight 8/11.  Painless.  Small to medium sized blood clots noted.  Patient having soft BMs with bowel regimen.   Patient and family report colonoscopy couple years ago (GI with [facility]) that showed colon polyps. Continue bowel  regimen and [facility] consulted, discussed with them, okay to have clears for breakfast then n.p.o. in case procedures planned. Cardiology giving a 500 mL saline bolus. DD: Hemorrhoid, diverticular, variceal    DVT prophylaxis: Eliquis.  Code Status: FULL CODE.  Family Communication: Discussed in detail with patient's daughter at bedside.  Updated care and answered questions. Disposition Plan: To be determined pending clinical improvement and cardiology recommendations.  Consultants:   Oncology Dr Marin Olp  Heart failure Team. Dr Sung Amabile.   Palliative care consult.   Procedures:   none.   Antimicrobials:  Off antibiotics since 8/8  Subjective: Overnight events noted.  Increased painless rectal bleeding.  Daughter showed a couple of pictures with medium size clots on her smart phone.  Some dizziness while getting initially to upright position.  No chest pain or dyspnea.   Objective: Vitals:   11/24/17 0400 11/24/17 0558 11/24/17 0834 11/24/17 1100  BP:  (!) 91/57 (!) 94/59 (!) 97/54  Pulse: 65 67  66  Resp: 14 (!) 21 (!) 24 (!) 24  Temp:  97.9 F (36.6 C) 98.4 F (36.9 C) 98.4 F (36.9 C)  TempSrc:  Oral Oral Oral  SpO2: 99% 99% 95% 95%  Weight:  74.8 kg    Height:  Intake/Output Summary (Last 24 hours) at 11/24/2017 1136 Last data filed at 11/24/2017 0600 Gross per 24 hour  Intake 471.43 ml  Output 503 ml  Net -31.57 ml   Filed Weights   11/22/17 0500 11/23/17 0430 11/24/17 0558  Weight: 79.2 kg 78.9 kg 74.8 kg    Examination:  General exam: Pleasant elderly male, moderately built and nourished, sitting up comfortably in chair this morning. Respiratory system: Clear to auscultation.  No increased work of breathing. Cardiovascular system: S1 and S2 heard, RRR.  No JVD, murmurs and trace ankle edema present.  Telemetry personally reviewed and showed SR with BBB morphology.  Stable Gastrointestinal system: Abdomen is soft non tender non  distended,   Normal bowel sounds heard.  Stable Central nervous system: Alert and oriented. Non focal.  Stable Extremities: Moves all limbs symmetrically. Psychiatry: . Mood & affect appropriate.   Data Reviewed: I have personally reviewed following labs and imaging studies  CBC: Recent Labs  Lab 11/22/17 0230 11/23/17 0718 11/23/17 1545 11/24/17 0158 11/24/17 0806  WBC 5.8 6.2 5.3 5.6 6.7  HGB 10.2* 10.1* 10.0* 9.4* 10.7*  HCT 33.1* 33.1* 32.5* 30.9* 35.4*  MCV 90.7 91.4 90.5 90.1 91.0  PLT 126* 127* 122* 134* 086   Basic Metabolic Panel: Recent Labs  Lab 11/20/17 0500 11/21/17 0729 11/22/17 0230 11/23/17 0718 11/24/17 0158  NA 139 139 137 141 141  K 3.6 4.5 4.3 4.2 3.5  CL 94* 96* 95* 97* 96*  CO2 34* 33* 34* 34* 36*  GLUCOSE 127* 119* 115* 95 126*  BUN 58* 38* 29* 29* 24*  CREATININE 2.06* 1.67* 1.50* 1.66* 1.53*  CALCIUM 8.7* 9.0 8.7* 8.6* 8.3*   GFR: Estimated Creatinine Clearance: 39.8 mL/min (A) (by C-G formula based on SCr of 1.53 mg/dL (H)). Liver Function Tests: Recent Labs  Lab 11/19/17 0401 11/20/17 0500  AST 37 33  ALT 39 36  ALKPHOS 206* 190*  BILITOT 1.7* 1.6*  PROT 6.0* 5.8*  ALBUMIN 2.7* 2.6*    Recent Labs  Lab 11/19/17 1128  AMMONIA 38*   CBG: Recent Labs  Lab 11/23/17 1213 11/23/17 1631 11/23/17 2108 11/24/17 0757 11/24/17 1126  GLUCAP 215* 105* 169* 99 140*     Recent Results (from the past 240 hour(s))  Culture, blood (Routine X 2) w Reflex to ID Panel     Status: None   Collection Time: 11/17/17 11:16 AM  Result Value Ref Range Status   Specimen Description BLOOD LEFT ANTECUBITAL  Final   Special Requests   Final    BOTTLES DRAWN AEROBIC AND ANAEROBIC Blood Culture adequate volume   Culture   Final    NO GROWTH 5 DAYS Performed at [facility], [place] [address] [place], [place] [address]    Report Status 11/22/2017 FINAL  Final  Culture, blood (Routine X 2) w Reflex to ID Panel     Status: None   Collection  Time: 11/17/17 11:22 AM  Result Value Ref Range Status   Specimen Description BLOOD LEFT HAND  Final   Special Requests   Final    BOTTLES DRAWN AEROBIC AND ANAEROBIC Blood Culture adequate volume   Culture   Final    NO GROWTH 5 DAYS Performed at [facility], [place] [address] [place], [place] [address]    Report Status 11/22/2017 FINAL  Final  MRSA PCR Screening     Status: None   Collection Time: 11/17/17  8:27 PM  Result Value Ref Range Status  MRSA by PCR NEGATIVE NEGATIVE Final    Comment:        The GeneXpert MRSA Assay (FDA approved for NASAL specimens only), is one component of a comprehensive MRSA colonization surveillance program. It is not intended to diagnose MRSA infection nor to guide or monitor treatment for MRSA infections. Performed at [facility], [place] [address], [place], [place] [address]   Culture, blood (routine x 2)     Status: None (Preliminary result)   Collection Time: 11/21/17  5:20 AM  Result Value Ref Range Status   Specimen Description BLOOD RIGHT ARM  Final   Special Requests   Final    BOTTLES DRAWN AEROBIC ONLY Blood Culture adequate volume   Culture   Final    NO GROWTH 3 DAYS Performed at [facility], [address] [place], [place] [address]    Report Status PENDING  Incomplete  Culture, blood (routine x 2)     Status: None (Preliminary result)   Collection Time: 11/21/17  5:25 AM  Result Value Ref Range Status   Specimen Description BLOOD RIGHT ARM  Final   Special Requests   Final    BOTTLES DRAWN AEROBIC ONLY Blood Culture adequate volume   Culture   Final    NO GROWTH 3 DAYS Performed at [facility], [address] [place], [place] [address]    Report Status PENDING  Incomplete         Radiology Studies: No results found.      Scheduled Meds: . amiodarone  200 mg Oral BID  . bisacodyl  10 mg Oral Daily  . dronabinol  2.5 mg Oral BID AC  . famciclovir  250 mg Oral Daily  . feeding  supplement (ENSURE ENLIVE)  237 mL Oral BID BM  . gabapentin  800 mg Oral QHS  . hydrocortisone   Rectal TID  . insulin aspart  0-15 Units Subcutaneous TID WC  . insulin aspart  0-5 Units Subcutaneous QHS  . insulin aspart  5 Units Subcutaneous TID WC  . magnesium oxide  400 mg Oral Daily  . Melatonin  6 mg Oral QHS  . pantoprazole  40 mg Oral Daily  . polyethylene glycol  17 g Oral Daily  . rosuvastatin  10 mg Oral Daily  . senna-docusate  2 tablet Oral BID  . sertraline  25 mg Oral Daily  . sodium chloride flush  3 mL Intravenous Q12H  . Tafamidis Meglumine (Cardiac)  4 capsule Oral Daily  . traZODone  50 mg Oral QHS   Continuous Infusions: . sodium chloride Stopped (11/20/17 1640)  . milrinone 0.125 mcg/kg/min (11/24/17 0657)     LOS: 19 days    Time spent: 35 Minutes.     Vernell Leep, MD, FACP, [facility]. [facility] Pager [phone]  If 7PM-7AM, please contact night-coverage [email or web address] Password [facility] 11/24/2017, 11:36 AM

## 2017-11-24 NOTE — Progress Notes (Addendum)
Patient ID: Dillon Andrews, male   DOB: 1941-01-08, 77 y.o.   MRN: [id number]     Advanced Heart Failure Rounding Note  PCP-Cardiologist: Glenetta Hew, MD   Subjective:    Remains on milrinone. PICC line removed for line holiday. Blood cultures NGTD. Weight shows down 4.1 kg (suspect inaccurate).  Walked with PT. Recommending HHPT.  Overnight starting having rectal bleeding. 3 medium-sized blood clots passed this am.   Feels weak. He is more lightheaded with standing. Denies SOB. No ab pain.   Hgb 10.1 -> 9.4  Objective:   Weight Range: 74.8 kg Body mass index is 24.34 kg/m.   Vital Signs:   Temp:  [97.9 F (36.6 C)-98.9 F (37.2 C)] 97.9 F (36.6 C) (08/12 0558) Pulse Rate:  [60-71] 67 (08/12 0558) Resp:  [14-24] 21 (08/12 0558) BP: (91-103)/(55-65) 91/57 (08/12 0558) SpO2:  [90 %-100 %] 99 % (08/12 0558) Weight:  [74.8 kg] 74.8 kg (08/12 0558) Last BM Date: 11/23/17  Weight change: Filed Weights   11/22/17 0500 11/23/17 0430 11/24/17 0558  Weight: 79.2 kg 78.9 kg 74.8 kg    Intake/Output:   Intake/Output Summary (Last 24 hours) at 11/24/2017 0810 Last data filed at 11/24/2017 0600 Gross per 24 hour  Intake 828.43 ml  Output 503 ml  Net 325.43 ml      Physical Exam    General: Pale. Frail. No resp difficulty. HEENT: Normal Neck: Supple. JVP 7-8 cm Carotids 2+ bilat; no bruits. No thyromegaly or nodule noted. Cor: PMI nondisplaced. RRR, No M/G/R noted Lungs: CTAB, normal effort. Abdomen: Soft, non-tender, non-distended, no HSM. No bruits or masses. +BS  Extremities: no cyanosis, clubbing, rash, edema Neuro: alert & oriented x 3, cranial nerves grossly intact. moves all 4 extremities w/o difficulty. Affect pleasant   Telemetry   NSR 60-70s, personally reviewed.   EKG    No new tracings.    Labs    CBC Recent Labs    11/23/17 1545 11/24/17 0158  WBC 5.3 5.6  HGB 10.0* 9.4*  HCT 32.5* 30.9*  MCV 90.5 90.1  PLT 122* 134*   Basic  Metabolic Panel Recent Labs    11/23/17 0718 11/24/17 0158  NA 141 141  K 4.2 3.5  CL 97* 96*  CO2 34* 36*  GLUCOSE 95 126*  BUN 29* 24*  CREATININE 1.66* 1.53*  CALCIUM 8.6* 8.3*   Liver Function Tests No results for input(s): AST, ALT, ALKPHOS, BILITOT, PROT, ALBUMIN in the last 72 hours. No results for input(s): LIPASE, AMYLASE in the last 72 hours. Cardiac Enzymes No results for input(s): CKTOTAL, CKMB, CKMBINDEX, TROPONINI in the last 72 hours.  BNP: BNP (last 3 results) Recent Labs    06/25/17 1207 09/15/17 1401 11/05/17 1310  BNP 420.9* 393.0* 515.3*    ProBNP (last 3 results) No results for input(s): PROBNP in the last 8760 hours.   D-Dimer No results for input(s): DDIMER in the last 72 hours. Hemoglobin A1C No results for input(s): HGBA1C in the last 72 hours. Fasting Lipid Panel No results for input(s): CHOL, HDL, LDLCALC, TRIG, CHOLHDL, LDLDIRECT in the last 72 hours. Thyroid Function Tests No results for input(s): TSH, T4TOTAL, T3FREE, THYROIDAB in the last 72 hours.  Invalid input(s): FREET3  Other results:   Imaging    No results found.   Medications:     Scheduled Medications: . amiodarone  200 mg Oral BID  . apixaban  5 mg Oral BID  . bisacodyl  10 mg Oral Daily  .  dronabinol  2.5 mg Oral BID AC  . famciclovir  250 mg Oral Daily  . feeding supplement (ENSURE ENLIVE)  237 mL Oral BID BM  . gabapentin  800 mg Oral QHS  . hydrocortisone   Rectal TID  . insulin aspart  0-15 Units Subcutaneous TID WC  . insulin aspart  0-5 Units Subcutaneous QHS  . insulin aspart  5 Units Subcutaneous TID WC  . magnesium oxide  400 mg Oral Daily  . Melatonin  6 mg Oral QHS  . pantoprazole  40 mg Oral Daily  . polyethylene glycol  17 g Oral Daily  . rosuvastatin  10 mg Oral Daily  . senna-docusate  2 tablet Oral BID  . sertraline  25 mg Oral Daily  . sodium chloride flush  3 mL Intravenous Q12H  . Tafamidis Meglumine (Cardiac)  4 capsule Oral  Daily  . torsemide  20 mg Oral Daily  . traZODone  50 mg Oral QHS    Infusions: . sodium chloride Stopped (11/20/17 1640)  . milrinone 0.125 mcg/kg/min (11/24/17 0657)    PRN Medications: sodium chloride, acetaminophen, bisacodyl, LORazepam, morphine injection, ondansetron (ZOFRAN) IV, simethicone, sodium chloride flush, zolpidem    Patient Profile   Dillon Andrews is a 77 y.o. male with a hx of chronic combined CHF due to NICM, CKD III,presumedNASH(per pt, never proven),esophageal varices per chart(prior glass of wine occasionally but no heavy alcohol),HTN, HLD, DM, Barrett's esophagus, GERD, anemia, RBBB, TTR amyloidosis, and Kappa Light Chain myeloma.  Admitted to [facility] 11/05/17 in setting of new Afib with RVR.   Assessment/Plan   1. Acute on chronic combined HF with TTR amyloidosis (Wild [place]) - [facility] with EF 40-45%. Echo 06/2017: EF 25-30%, mild LAE, mild/mod RV dilation, mod/severe RA. - PYP scan strongly suggestive of TTR amyloid, SPEP with 0.8% m-spike, UPEP with Bence Jones Protein positive, kappa type. 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 - Dr Marin Olp saw and thinks he does not have AL amyloid (just multiple myeloma) and that he has TTR amyloid and should be treated with tafamidis before chemo. Tafamidis started on 8/2.   Off milrinone 8/3. Milrinone restarted 8/7. Off Norep 8/6. - Hold torsemide today with bleeding. Creatinine down to 1.53.  - Continue milrinone 0.125 mcg. Have decided on home milrinone and [facility] has been contacted.  He will have tunneled catheter placed tomorrow morning, ordered.  - Continue to hold spiro.  - No ARB/entresto with for now with CKD - SBP soft 90-100s. Hold off on hydralazine/imdur  2. Sepsis - Afebrile. WBC normal.  - Has completed Vanc and Cefepine. - 8/5 Blood culture- NGTD - PICC removed. Repeat blood cultures done 8/9. Will need tunneled line for milrinone. Ordered for Monday  3. New onset  paroxysmal AFlutter  - likely triggered HF decompensation  - s/p TEE DCCV 7/29.   - Back in atrial flutter 7/31, repeat DCCV on 8/2. Converted back to aflutter 8/5. - Maintaining NSR - Continue  amio to 200 mg twice a day - Holding eliquis with rectal bleed.    4. AKI on CKD III, baseline ~1.6 - Creatinine improved.  - Holding torsemide with bleeding.   5. Recurrent bilateral effusions - Somewhat increased from March on CXR 11/05/17. No change.   6. Ethan with Dr. Marin Olp who has seen this admission.  - Started on Bortezomib7/18/18andRevlimid7/23/19. - Continue management per primary team. - Dr Marin Olp thinks he does not have AL  amyloid and that he does have TTR amyloid and should be treated with tafamodis before chemo. Now on Tafamidis. No change.   7. NASH with esophageal varices.  - Watch closely for bleeding with AC. Hemoglobin down to 9.4  - Last EGD 06/21/2014 at [facility] for surveillance of Esophageal varices, Barrett's esophagus, and PMHx of Colon polyps. Showed two columns of small esophageal varices w/out stigmata, short segment Barrett's esophagus (without biopsies, due to presence of EVs), 5cm hiatal hernia, and hyperplastic small gastric nodules.   8. DM2 - Per primary. No change.   9. Hypokalemia. - K 3.5 this am.   11. Rectal bleeding - History of hemorrhoids, more bleeding than typical for him this morning but with large BM.  Hgb 10.1 -> 9.4 - With worsening overnight.  - Hold eliquis and torsemide.  - IM to consult GI.  - Will give 500 cc back.   12. DNR/DNI  Length of Stay: [place], [place]  8:10 AM  Advanced Heart Failure Team Pager [phone] (M-F; 7a - 4p)  Please contact [facility] for night-coverage after hours (4p -7a ) and weekends on [email or web address]  Patient seen and examined with the above-signed Advanced Practice Provider and/or Housestaff. I personally reviewed laboratory data, imaging  studies and relevant notes. I independently examined the patient and formulated the important aspects of the plan. I have edited the note to reflect any of my changes or salient points. I have personally discussed the plan with the patient and/or family.  Had dark clots per rectum this am. Feels weak and pale. Concern for possible lower GI bleed (versus variceal). Will check CBC and get type and screen. Hold diuretics. Give 500c NS. GI to see. Hold off on tunneled PICC. Remains in NSR will hold Eliquis for now. Continue milrinone.   Glori Bickers, MD  9:26 AM

## 2017-11-24 NOTE — Progress Notes (Signed)
PT Cancellation Note  Patient Details Name: Dillon Andrews MRN: [id number] DOB: 02-09-1941   Cancelled Treatment:    Reason Eval/Treat Not Completed: Patient not medically ready. RN deferred treatment today as pt is actively bleeding from the rectum. She prefers the patient to rest today. PT to return as able, as appropriate.    Kittie Plater, PT, DPT Pager #: [phone] Office #: [phone]    [place] 11/24/2017, 11:27 AM

## 2017-11-24 NOTE — Progress Notes (Signed)
Patient OOB to go to bathroom and had 3 medium size bloody clots fall from rectum. Patient had a bowel movement with blood in commode which was clotted as well. Will report to day RN and make sure physician is aware of this new occurrence. HGB stable. Patient denies being SOB or dizzy while ambulating. Will continue to monitor closely.

## 2017-11-24 NOTE — Consult Note (Addendum)
[facility] Consult: 11:08 AM 11/24/2017  LOS: 19 days    Referring Provider: Dr Algis Liming  Primary Care Physician:  [name], MD Primary Gastroenterologist:  Dr Mitchell Heir at [facility] in [facility].      Reason for Consultation:  Painless hematochezia, bleeding PR.      HPI: Dillon Andrews is a 77 y.o. male.  PMH combined CHF (EF 25 to 30% in 06/2017.  PYP scan suggestive of transthyretin amyloidosis.  Cardiac cath 09/2016: non-obstructive CAD, 30% cfx stenosis.  CKD.  Kappa light chain myeloma started chemo (velcade, Revlimid) 10/30/17, 1 dose so far.   History of LFT elevations dating back 40 years.  NASH, ?cirrhosis? with hx non-bleeding esophageal varices 2006 (per family and pt).  However multiple previous ultrasounds showed no liver disease, normal hepato-pedal flow, no PV stenosis.  S/p cholecystectomy.  Normal gastric emptying study 11/2016.   Hx gastric and colon polyps.  S/p several EGDs dating to at least 2006.  Only able to locate pathology reports.     05/2014 Colonoscopy.  Pathology: Tubular adenoma.   07/2015 EGD.  Pathology: gastric heterotopia, hyperplastic gastric polyp.  Mild inflamation at [employer].  No Barretts, no carcinoid.   AFP levels WNL 2014 - 2016.   Ascites noted on CT of the chest in 03/2017.  C/o dyspnea, weakness, 10 # weight gain for ~ 2 week.  Heart rate intermittently elevated to 130s at home.   Now diagnosed with new onset A fib and s/p DCCV.  Started on Eliquis 7/25.  Started on milrinone. 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  Wants to treat with tafamodis (this proved too costly for pt when recently prescribed) then start chemo.  Patient is also going to undergo placement of IV catheter to allow for home milrinone. Limited abdominal ultrasound of/6/19 showed post  cholecystectomy appearance.  Normal liver echogenicity and patent portal vein with normal hepatopetal flow.  Small right-sided pleural effusion.  Overnight developed bleeding beginning 2 days ago he developed bleeding per rectum.  Initially this was small volume but yesterday he had a larger palm sized incident and this morning the commode water turned red after he passed blood.  The bleeding is associated with bowel movements but he is also spontaneously passing blood.  No abdominal pain.  No nausea.  Previous history intermittent minor bleeding per rectum which had not accelerated in recent weeks.  Over the course of this nearly 20-day hospitalization, he has suffered from constipation which does not generally bother him at home.  Home meds include probiotic, 81 ASA, Omeprazole 40 mg,     Past Medical History:  Diagnosis Date  . Anemia 11/2016  . Arthritis    "some; hands/fingers" (06/25/2017)  . Atrial tachycardia ([place])   . Barrett's esophagus   . Basal cell carcinoma    "face, arms, hands" (06/25/2017)  . Chicken pox   . Cholelithiasis 11/2016   S/P LAP CHOLE  . Chronic combined systolic and diastolic CHF (congestive heart failure) ([place])   . CKD (chronic kidney disease), stage  III ([place])   . Esophageal varices (HCC)    Secondary to NAFLD  . Family history of adverse reaction to anesthesia    "daughter's sat drop very low; last OR had to be reintubated" (06/25/2017)  . GERD (gastroesophageal reflux disease)   . Goals of care, counseling/discussion 10/22/2017  . High cholesterol   . Hypertension   . Kappa light chain myeloma ([place]) 10/22/2017  . Mitral regurgitation   . NAFLD (nonalcoholic fatty liver disease)   . NICM (nonischemic cardiomyopathy) ([place])    Echo 06/2017: Severe LVH.  EF 25 to 30% with mild HK of the baseline after septal wall.  Moderate to severely dilated right atrium, moderately dilated RV. (Concern fr ? amyloidosis).  . NSVT (nonsustained ventricular tachycardia) ([place])    . Pneumonia 09/2016  . RBBB   . Tricuspid regurgitation   . Type II diabetes mellitus ([place])     Past Surgical History:  Procedure Laterality Date  . BACK SURGERY    . BASAL CELL CARCINOMA EXCISION Right    temple; "had to do skin graft"  . CARDIAC CATHETERIZATION  09/2016   Minimal, non-occlusive CAD, EF 45% [facility])  . CARDIOVERSION N/A 11/10/2017   Procedure: CARDIOVERSION;  Surgeon: Jolaine Artist, MD;  Location: [facility] ENDOSCOPY;  Service: Cardiovascular;  Laterality: N/A;  . CARDIOVERSION N/A 11/14/2017   Procedure: CARDIOVERSION;  Surgeon: Jolaine Artist, MD;  Location: [facility] ENDOSCOPY;  Service: Cardiovascular;  Laterality: N/A;  . CARPAL TUNNEL RELEASE Bilateral   . CATARACT EXTRACTION W/ INTRAOCULAR LENS  IMPLANT, BILATERAL Bilateral   . CHOLECYSTECTOMY N/A 11/22/2016   Procedure: LAPAROSCOPIC CHOLECYSTECTOMY;  Surgeon: Coralie Keens, MD;  Location: [place];  Service: General;  Laterality: N/A;  . JOINT REPLACEMENT    . LUMBAR DISC SURGERY  X 2  . TEE WITHOUT CARDIOVERSION N/A 11/10/2017   Procedure: TRANSESOPHAGEAL ECHOCARDIOGRAM (TEE);  Surgeon: Jolaine Artist, MD;  Location: [facility] ENDOSCOPY;  Service: Cardiovascular;  Laterality: N/A;  . TONSILLECTOMY AND ADENOIDECTOMY    . TOTAL KNEE ARTHROPLASTY Left 2010  . TRANSESOPHAGEAL ECHOCARDIOGRAM  09/2016   Mild global LV hypokinesis. Moderate MR. Moderate TR. [facility])  . TRANSTHORACIC ECHOCARDIOGRAM  10/2016   [facility]) EF 35-40% with moderate HK. Normal RV function. Moderately severe TR.  Marland Kitchen TRANSTHORACIC ECHOCARDIOGRAM  03/2017; 06/2017    A) ([place]) mild concentric LVH. EF of 40-45%. Diffuse HK with no RWMA. GR 2 DD. Septal dyssynergy. Mild MR, moderate TR. Severe RA dilation..;; B) Severe LVH.  EF 25 to 30% with mild HK of Septal wall.  Mod-severely dilated RA & Mod dilated RV.    Prior to Admission medications   Medication Sig Start Date End Date Taking? Authorizing Provider   aspirin EC 81 MG tablet Take 81 mg by mouth daily. 12/23/14  Yes [provider]  carvedilol (COREG) 6.25 MG tablet TAKE 1 TABLET BY MOUTH TWICE A DAY WITH MEALS 10/28/17  Yes Leonie Man, MD  dexamethasone (DECADRON) 4 MG tablet Take 5 tablets (20 mg total) by mouth once a week. 10/30/17  Yes Volanda Napoleon, MD  dronabinol (MARINOL) 2.5 MG capsule Take 1 capsule (2.5 mg total) by mouth 2 (two) times daily before lunch and supper. 10/22/17  Yes Volanda Napoleon, MD  famciclovir (FAMVIR) 250 MG tablet Take 1 tablet (250 mg total) by mouth daily. 10/22/17  Yes Volanda Napoleon, MD  gabapentin (NEURONTIN) 800 MG tablet TAKE 1 TABLET BY MOUTH AT BEDTIME 09/23/17  Yes [name], MD  magnesium oxide (MAG-OX) 400 MG tablet Take 400 mg by mouth daily.   Yes [provider]  Melatonin 5 MG TABS Take 5 mg by mouth at bedtime.    Yes [provider]  omeprazole (PRILOSEC) 40 MG capsule Take 1 capsule (40 mg total) by mouth daily. 07/31/17  Yes [name], MD  ondansetron (ZOFRAN) 4 MG tablet Take 1 tablet (4 mg total) by mouth every 8 (eight) hours as needed for nausea or vomiting. 10/08/17  Yes [name], MD  potassium chloride SA (K-DUR,KLOR-CON) 20 MEQ tablet TAKE 1 TO 2 TABLET DAILY AS DIRECTED 08/11/17  Yes Leonie Man, MD  Probiotic Product (PROBIOTIC DAILY PO) Take 1 tablet by mouth.   Yes [provider]  rosuvastatin (CRESTOR) 10 MG tablet TAKE 1 TABLET BY MOUTH ONCE DAILY 10/29/17  Yes [name], MD  torsemide (DEMADEX) 20 MG tablet Take 20 mg by mouth 2 (two) times daily.    Yes [provider]  traZODone (DESYREL) 50 MG tablet TAKE 1 TABLET(50 MG) BY MOUTH AT BEDTIME 10/29/17  Yes [name], MD  vitamin B-12 (CYANOCOBALAMIN) 500 MCG tablet Take 500 mcg by mouth every other day.   Yes [provider]  zolpidem (AMBIEN) 5 MG tablet TAKE 1 TABLET BY MOUTH EVERY NIGHT AT BEDTIME AS  NEEDED FOR SLEEP 10/23/17  Yes [name], MD  glucose blood test strip Use as instructed 03/05/17   [name], MD  REVLIMID 20 MG capsule TAKE 1 CAPSULE BY MOUTH AT BEDTIME FOR 21 DAYS ON AND 7 DAYS OFF. SWALLOW WHOLE, DO NOT CRUSH 11/20/17   Volanda Napoleon, MD  VYNDAQEL 20 MG CAPS Take 20 mg by mouth daily. 11/03/17   [provider]    Scheduled Meds: . amiodarone  200 mg Oral BID  . bisacodyl  10 mg Oral Daily  . dronabinol  2.5 mg Oral BID AC  . famciclovir  250 mg Oral Daily  . feeding supplement (ENSURE ENLIVE)  237 mL Oral BID BM  . gabapentin  800 mg Oral QHS  . hydrocortisone   Rectal TID  . insulin aspart  0-15 Units Subcutaneous TID WC  . insulin aspart  0-5 Units Subcutaneous QHS  . insulin aspart  5 Units Subcutaneous TID WC  . magnesium oxide  400 mg Oral Daily  . Melatonin  6 mg Oral QHS  . pantoprazole  40 mg Oral Daily  . polyethylene glycol  17 g Oral Daily  . rosuvastatin  10 mg Oral Daily  . senna-docusate  2 tablet Oral BID  . sertraline  25 mg Oral Daily  . sodium chloride flush  3 mL Intravenous Q12H  . Tafamidis Meglumine (Cardiac)  4 capsule Oral Daily  . traZODone  50 mg Oral QHS   Infusions: . sodium chloride Stopped (11/20/17 1640)  . milrinone 0.125 mcg/kg/min (11/24/17 0657)   PRN Meds: sodium chloride, acetaminophen, bisacodyl, LORazepam, morphine injection, ondansetron (ZOFRAN) IV, simethicone, sodium chloride flush, zolpidem   Allergies as of 11/05/2017  . (No Known Allergies)    Family History  Problem Relation Age of Onset  . Heart failure Mother   . Cancer Father   . Cataracts Father   . Breast cancer Sister   . Alcohol abuse Brother   . Liver disease Brother   . Autoimmune disease Daughter        Bechet's syndrome  . Cirrhosis Neg Hx  Social History   Socioeconomic History  . Marital status: Married    Spouse name: Not on file  . Number of children: 4  . Years of education: 28  .  Highest education level: Not on file  Occupational History  . Occupation: retired  Scientific laboratory technician  . Financial resource strain: Not on file  . Food insecurity:    Worry: Not on file    Inability: Not on file  . Transportation needs:    Medical: Not on file    Non-medical: Not on file  Tobacco Use  . Smoking status: Former Smoker    Packs/day: 0.30    Years: 15.00    Pack years: 4.50    Types: Cigarettes    Last attempt to quit: 03/17/1977    Years since quitting: 40.7  . Smokeless tobacco: Never Used  Substance and Sexual Activity  . Alcohol use: Yes    Comment: 06/25/2017 "nothing since 09/2016;  used to have glass of wine q hs"  . Drug use: No  . Sexual activity: Not on file  Lifestyle  . Physical activity:    Days per week: Not on file    Minutes per session: Not on file  . Stress: Not on file  Relationships  . Social connections:    Talks on phone: Not on file    Gets together: Not on file    Attends religious service: Not on file    Active member of club or organization: Not on file    Attends meetings of clubs or organizations: Not on file    Relationship status: Not on file  . Intimate partner violence:    Fear of current or ex partner: Not on file    Emotionally abused: Not on file    Physically abused: Not on file    Forced sexual activity: Not on file  Other Topics Concern  . Not on file  Social History Narrative   He is retired from [employer].  Previously worked for [employer] by his daughter --> also a patient of Dr. Ellyn Hack   Lives with wife in a one story home.    Education: high school.     REVIEW OF SYSTEMS: Constitutional: Weakness has overall improved but he still does not feel strong. ENT:  No nose bleeds Pulm: Shortness of breath improved.  No productive cough. CV:  No palpitations, no LE edema.  GU:  No hematuria, no frequency GI:  Per HPI.  No heartburn, no dysphagia. Heme: Than the rectal bleeding, has not noticed  any unusual or excessive bleeding/bruising. Transfusions: None. Neuro:  No headaches, no peripheral tingling or numbness Derm:  No itching, no rash or sores.  Endocrine:  No sweats or chills.  No polyuria or dysuria Immunization: Not queried. Travel:  None beyond local counties in last few months.    PHYSICAL EXAM: Vital signs in last 24 hours: Vitals:   11/24/17 0834 11/24/17 1100  BP: (!) 94/59 (!) 97/54  Pulse:  66  Resp: (!) 24 (!) 24  Temp: 98.4 F (36.9 C) 98.4 F (36.9 C)  SpO2: 95% 95%   Wt Readings from Last 3 Encounters:  11/24/17 74.8 kg  10/22/17 84.3 kg  09/29/17 85.4 kg    General: Slightly frail appearing but alert and comfortable and not acutely ill looking WM. Head: Facial asymmetry or swelling.  No signs of head trauma. Eyes: Conjunctival pallor.  No scleral icterus. Ears: Not hard of hearing Nose: Discharge  or congestion Mouth: Moist, clear, pink oral mucosa.  Tongue midline. Neck: JVD, no masses, no thyromegaly Lungs: Clear bilaterally.  No cough.  No labored breathing. Heart: RRR.  No MRG.  S1, S2 present. Abdomen: Nondistended, nontender.  Active bowel sounds.  No HSM, masses, bruits, hernias.   Rectal: No visible or palpable hemorrhoids.  Clot of blood palpated in the rectum.  Deep red blood on exam glove (this is not melena) Musc/Skeltl: No joint redness, swelling or significant deformity. Extremities: No CCE. Neurologic: Alert.  Oriented x3.  Moves all 4 limbs.  No tremors.  No obvious weakness or deficits. Skin: No rash, no sores, no suspicious lesions. Tattoos: None seen. Nodes: No cervical adenopathy. Psych: Cooperative, pleasant, fluid speech.  Intake/Output from previous day: 08/11 0701 - 08/12 0700 In: 828.4 [P.O.:757; I.V.:71.4] Out: 503 [Urine:501; Stool:2] Intake/Output this shift: No intake/output data recorded.  LAB RESULTS: Recent Labs    11/23/17 1545 11/24/17 0158 11/24/17 0806  WBC 5.3 5.6 6.7  HGB 10.0* 9.4* 10.7*    HCT 32.5* 30.9* 35.4*  PLT 122* 134* 176   BMET Lab Results  Component Value Date   NA 141 11/24/2017   NA 141 11/23/2017   NA 137 11/22/2017   K 3.5 11/24/2017   K 4.2 11/23/2017   K 4.3 11/22/2017   CL 96 (L) 11/24/2017   CL 97 (L) 11/23/2017   CL 95 (L) 11/22/2017   CO2 36 (H) 11/24/2017   CO2 34 (H) 11/23/2017   CO2 34 (H) 11/22/2017   GLUCOSE 126 (H) 11/24/2017   GLUCOSE 95 11/23/2017   GLUCOSE 115 (H) 11/22/2017   BUN 24 (H) 11/24/2017   BUN 29 (H) 11/23/2017   BUN 29 (H) 11/22/2017   CREATININE 1.53 (H) 11/24/2017   CREATININE 1.66 (H) 11/23/2017   CREATININE 1.50 (H) 11/22/2017   CALCIUM 8.3 (L) 11/24/2017   CALCIUM 8.6 (L) 11/23/2017   CALCIUM 8.7 (L) 11/22/2017   LFT No results for input(s): PROT, ALBUMIN, AST, ALT, ALKPHOS, BILITOT, BILIDIR, IBILI in the last 72 hours. PT/INR Lab Results  Component Value Date   INR 1.32 11/05/2017   INR 1.21 09/23/2017   INR 1.28 07/10/2017   Hepatitis Panel No results for input(s): HEPBSAG, HCVAB, HEPAIGM, HEPBIGM in the last 72 hours. C-Diff No components found for: CDIFF Lipase     Component Value Date/Time   LIPASE 35 11/19/2016 1326    Drugs of Abuse  No results found for: LABOPIA, COCAINSCRNUR, LABBENZ, AMPHETMU, THCU, LABBARB   RADIOLOGY STUDIES: No results found.   IMPRESSION:   *    Painless hematochezia.  In the setting of Eliquis.  Rule out neoplasia, rule out AVM, rule out stercoral ulcer.   This looks like lower GIB.     *  Chronic anemia.  Hgb steadily declining over several days.  Not in need of blood transfusions.    *    Hx NASH, ? Cirrhosis.    *   Pt reported hx esophageal varices in 2006.  Need cc of latest EGD from 2017 (or more recently ??).  Tubular adenoma colon polyp 05/2014.     *     New onset A. fib with RVR.  Mintaining sinus rhythm following DCCV 8/2.    *    Combined CHF.  Worsened in setting of A. fib with RVR.  On Milrinone.   Dr. Marin Olp feels that this is TTR rather  than AL amyloid  *    Multiple myeloma.  Began chemotherapy late July 2019.  *   CKD, AKI.      PLAN:     *   Obtain Endo/colon/GI records.   pphone # is [phone]  *   ? Colonoscopy vs flex sig, ? EGD: decide once we have GI records.   If vigorous bleeding, consider nuc med scan.  CT angio likeley not feasible given GFR of 29 - 38.     Azucena Freed  11/24/2017, 11:08 AM Phone [phone]    Attending physician's note   I have taken a history, examined the patient and reviewed the chart. I agree with the Advanced Practitioner's note, impression and recommendations.   Painless hematochezia on Eliquis. Probable LGI etiology. R/O diverticulosis, AVM, neoplasm, etc. History of NASH, esophageal varices. Suspected cirrhosis. UGI source is possible.   Attempt to obtain colonoscopy, EGD reports from Dr. Marin Comment.  Trend CBC.  Hold Eliquis.  Nuclear tagged RBC scan if moderate to major recurrent GI bleeding. Avoid CTA with CKD, AKI.  Further evaluation with colonoscopy and EGD on Wednesday if bleeding stops or is low volume.    Lucio Edward, MD FACG [phone] office

## 2017-11-24 NOTE — Progress Notes (Signed)
Patient ID: Dillon Andrews, male   DOB: [birth year], 77 y.o.   MRN: [id number]  This NP visited patient at the bedside as a follow up for ongoing palliative medicine needs, emotional support and have ongoing discussion with the patient's two daughters Dillon Andrews and Dillon Andrews regarding current medicial situation.  Answered questions and addressed concerns to the best of my ability regarding current medical situation.  Patient and his daughters understand the seriousness of the situation.  Main concern today is new onset GI bleeding, await gastroenterologist  Input.  Patient remains  open to all offered and  avialble medical interventions to prolong life.  Family verbalize hope for patient to stabilize and " be able to return home for more  Time with family"   All speak to their strong family unit and love for each other.  Discussed with patient the importance of continued conversation with each other and the medical providers regarding overall plan of care and treatment options,  ensuring decisions are within the context of the patients values and GOCs.  PMT will continue to support holistically    Discussed with bedside RN     Total time spent on the unit was 25 minutes  Greater than 50% of the time was spent in counseling and coordination of care  Wadie Lessen NP  Palliative Medicine Team Team Phone # [phone] Pager [phone]

## 2017-11-25 DIAGNOSIS — K921 Melena: Secondary | ICD-10-CM

## 2017-11-25 DIAGNOSIS — K625 Hemorrhage of anus and rectum: Secondary | ICD-10-CM

## 2017-11-25 DIAGNOSIS — D62 Acute posthemorrhagic anemia: Secondary | ICD-10-CM

## 2017-11-25 LAB — CBC WITH DIFFERENTIAL/PLATELET
ABS IMMATURE GRANULOCYTES: 0 10*3/uL (ref 0.0–0.1)
BASOS ABS: 0 10*3/uL (ref 0.0–0.1)
Basophils Relative: 0 %
EOS PCT: 5 %
Eosinophils Absolute: 0.2 10*3/uL (ref 0.0–0.7)
HEMATOCRIT: 28.5 % — AB (ref 39.0–52.0)
HEMOGLOBIN: 8.8 g/dL — AB (ref 13.0–17.0)
Immature Granulocytes: 0 %
LYMPHS ABS: 0.8 10*3/uL (ref 0.7–4.0)
LYMPHS PCT: 17 %
MCH: 28 pg (ref 26.0–34.0)
MCHC: 30.9 g/dL (ref 30.0–36.0)
MCV: 90.8 fL (ref 78.0–100.0)
Monocytes Absolute: 0.6 10*3/uL (ref 0.1–1.0)
Monocytes Relative: 12 %
NEUTROS ABS: 3.2 10*3/uL (ref 1.7–7.7)
Neutrophils Relative %: 66 %
Platelets: 142 10*3/uL — ABNORMAL LOW (ref 150–400)
RBC: 3.14 MIL/uL — AB (ref 4.22–5.81)
RDW: 18.2 % — ABNORMAL HIGH (ref 11.5–15.5)
WBC: 4.8 10*3/uL (ref 4.0–10.5)

## 2017-11-25 LAB — BASIC METABOLIC PANEL
ANION GAP: 8 (ref 5–15)
BUN: 17 mg/dL (ref 8–23)
CHLORIDE: 96 mmol/L — AB (ref 98–111)
CO2: 35 mmol/L — ABNORMAL HIGH (ref 22–32)
Calcium: 8.3 mg/dL — ABNORMAL LOW (ref 8.9–10.3)
Creatinine, Ser: 1.34 mg/dL — ABNORMAL HIGH (ref 0.61–1.24)
GFR calc Af Amer: 57 mL/min — ABNORMAL LOW (ref >60)
GFR, EST NON AFRICAN AMERICAN: 49 mL/min — AB (ref >60)
GLUCOSE: 148 mg/dL — AB (ref 70–99)
POTASSIUM: 3.6 mmol/L (ref 3.5–5.1)
Sodium: 139 mmol/L (ref 135–145)

## 2017-11-25 LAB — IRON AND TIBC
Iron: 44 ug/dL — ABNORMAL LOW (ref 45–182)
Saturation Ratios: 13 % — ABNORMAL LOW (ref 17.9–39.5)
TIBC: 347 ug/dL (ref 250–450)
UIBC: 303 ug/dL

## 2017-11-25 LAB — GLUCOSE, CAPILLARY
Glucose-Capillary: 98 mg/dL (ref 70–99)
Glucose-Capillary: 144 mg/dL — ABNORMAL HIGH (ref 70–99)
Glucose-Capillary: 131 mg/dL — ABNORMAL HIGH (ref 70–99)

## 2017-11-25 LAB — CBC
HCT: 29.5 % — ABNORMAL LOW (ref 39.0–52.0)
Hemoglobin: 9 g/dL — ABNORMAL LOW (ref 13.0–17.0)
MCH: 27.8 pg (ref 26.0–34.0)
MCHC: 30.5 g/dL (ref 30.0–36.0)
MCV: 91 fL (ref 78.0–100.0)
PLATELETS: 173 10*3/uL (ref 150–400)
RBC: 3.24 MIL/uL — ABNORMAL LOW (ref 4.22–5.81)
RDW: 18.1 % — AB (ref 11.5–15.5)
WBC: 5.7 10*3/uL (ref 4.0–10.5)

## 2017-11-25 LAB — FERRITIN: FERRITIN: 109 ng/mL (ref 24–336)

## 2017-11-25 MED ORDER — DOCUSATE SODIUM 100 MG PO CAPS
100.0000 mg | ORAL_CAPSULE | Freq: Two times a day (BID) | ORAL | Status: DC
  Administered 2017-11-25: 100 mg via ORAL
  Filled 2017-11-25 (×2): qty 1

## 2017-11-25 MED ORDER — HYDROCORTISONE ACETATE 25 MG RE SUPP
25.0000 mg | Freq: Two times a day (BID) | RECTAL | Status: DC
  Administered 2017-11-26 (×2): 25 mg via RECTAL
  Filled 2017-11-25 (×6): qty 1

## 2017-11-25 MED ORDER — METOCLOPRAMIDE HCL 5 MG/ML IJ SOLN
10.0000 mg | Freq: Once | INTRAMUSCULAR | Status: AC
  Administered 2017-11-25: 10 mg via INTRAVENOUS
  Filled 2017-11-25: qty 2

## 2017-11-25 NOTE — Care Management Note (Addendum)
Case Management Note Previous CM note completed by Bethena Roys, RN 11/07/2017, 11:29 AM   Patient Details  Name: Dillon Andrews MRN: [id number] Date of Birth: [birth year]  Subjective/Objective: Pt presented for Atrial Fib- Initiated on IV Amio gtt. Plan for Cardioversion question Monday. PT recommendations for [facility]. Agency List provided and patient chose [facility] for Services. Patient has DME RW at home.                    Action/Plan: Referral sent to [facility] with [facility] for PT. Pt will need HH PT order and F2F. SOC to begin within 24-48 hours post transition home. CM will provide patient with Eliquis 30 day free card. [facility] [address] has medication available. No further needs from CM at this time.    Expected Discharge Date:  11/10/17               Expected Discharge Plan:  [place]  In-House Referral:  NA  Discharge planning Services  CM Consult, Medication Assistance  Post Acute Care Choice:  Home Health Choice offered to:  Patient  DME Arranged:  N/A DME Agency:  NA  HH Arranged:  PT, RN, Nurse's Aide, Social Work(REDs Vest, home IV milrinone), RN HH Agency:  [place]  Status of Service:  Completed, signed off  If discussed at [employer], dates discussed:    Additional Comments: 11/25/17 Pt remains on IV milrinone - PICC line removed yesterday for line holiday, cultures in process.  Pt also actively bleeding - GI consulted  11/21/17- 1400- Kristi Webster RN, CM - noted pt to go home on IV milrinone, spoke with Pam at [facility] who is following for infusion needs- pt will also have REDs vest for HF management. CM in to see pt at bedside for transition of care needs- confirmed pt will use [facility] for [place] with [facility] has already spoken with pt and daughter. Plan is for pt to get PICC line Monday 8/12 and d/c home- pt denies any DME needs states he has a rollator at home. [facility], has 30 day  free card for Eliquis to use on discharge. Pt reports that on of his 4 children will transport him home. CM will continue to follow for any further transition of care needs.   [place] 11-14-17 Jacqlyn Krauss, RN BSN [phone] Pt will benefit from [facility] RN once stable to transition home. Pt will need HH RN, PT & F2F once stable to transition home. [facility] aware that RN will be added. No further needs from CM at this time.    S/W DONNA @ [place] [facility] # [id number]    1. ELIQUIS 2.5 MG BID  COVER- YES  CO-PAY- $ 8.50  TIER- 3 DRUG  PRIOR APPROVAL - NO   2. ELIQUIS  5 MG BID  COVER- YES  CO-PAY- $ 8.50  TIER- 3 DRUG  PRIOR APPROVAL- NO    PREFERRED PHARMACY: YES  [facility]  [place] DONNA @ [place] # [phone]    1. XARELTO 20 MG DAILY  COVER- YES  CO-PAY- $ 8.50  TIER- 3 DRUG  PRIOR APPROVAL- NO   PREFERRED PHARMACY : YES  [facility]  Murvin Donning, RN 11/25/2017, 9:47 AM Unit 2H 1-22 Case Manager  [phone]

## 2017-11-25 NOTE — Progress Notes (Addendum)
Patient ID: Dillon Andrews, male   DOB: 12/07/1940, 77 y.o.   MRN: [id number]     Advanced Heart Failure Rounding Note  PCP-Cardiologist: Dillon Andrews   Subjective:    Remains on milrinone. PICC line removed for line holiday. Blood cultures NGTD. Weights inaccurate yesterday. Weight 174 today which is consistent.   Hgb down to 8.8 in setting of melena. GI following for possible EGD/colon 11/26/17.   Walked with PT. Recommending HHPT.  Feeling OK this am. Remains slightly lightheaded with standing. Further small melena this am. Denies SOB. BP stable. Weight stable.   Hgb 10.1 -> 9.4 -> 8.8  Objective:   Weight Range: 79 kg Body mass index is 25.71 kg/m.   Vital Signs:   Temp:  [97.2 F (36.2 C)-99.3 F (37.4 C)] 97.7 F (36.5 C) (08/13 0350) Pulse Rate:  [56-66] 64 (08/13 0350) Resp:  [14-24] 18 (08/13 0350) BP: (91-110)/(54-59) 96/54 (08/13 0350) SpO2:  [95 %-100 %] 100 % (08/13 0350) Weight:  [79 kg] 79 kg (08/13 0500) Last BM Date: 11/24/17  Weight change: Filed Weights   11/23/17 0430 11/24/17 0558 11/25/17 0500  Weight: 78.9 kg 74.8 kg 79 kg    Intake/Output:   Intake/Output Summary (Last 24 hours) at 11/25/2017 0749 Last data filed at 11/25/2017 0500 Gross per 24 hour  Intake 1734.04 ml  Output 1025 ml  Net 709.04 ml      Physical Exam   General: Pale. Frail. NAD.  HEENT: Normal anicteric  Neck: Supple. JVP 7-8 cm. Carotids 2+ bilat; no bruits. No thyromegaly or nodule noted. Cor: PMI laterally displaced. RRR, No M/G/R noted Lungs: CTAB, normal effort. Abdomen: Soft, non-tender, non-distended, no HSM. No bruits or masses. +BS  Extremities: no cyanosis, clubbing, rash, edema Neuro: alert & oriented x 3, cranial nerves grossly intact. moves all 4 extremities w/o difficulty. Affect pleasant    Telemetry   NSR 60s, personally reviewed.   EKG    No new tracings.     Labs    CBC Recent Labs    11/24/17 2038 11/25/17 0332  WBC 4.9 4.8   NEUTROABS  --  3.2  HGB 9.1* 8.8*  HCT 29.7* 28.5*  MCV 91.1 90.8  PLT 157 867*   Basic Metabolic Panel Recent Labs    11/24/17 0158 11/25/17 0332  NA 141 139  K 3.5 3.6  CL 96* 96*  CO2 36* 35*  GLUCOSE 126* 148*  BUN 24* 17  CREATININE 1.53* 1.34*  CALCIUM 8.3* 8.3*   Liver Function Tests No results for input(s): AST, ALT, ALKPHOS, BILITOT, PROT, ALBUMIN in the last 72 hours. No results for input(s): LIPASE, AMYLASE in the last 72 hours. Cardiac Enzymes No results for input(s): CKTOTAL, CKMB, CKMBINDEX, TROPONINI in the last 72 hours.  BNP: BNP (last 3 results) Recent Labs    06/25/17 1207 09/15/17 1401 11/05/17 1310  BNP 420.9* 393.0* 515.3*    ProBNP (last 3 results) No results for input(s): PROBNP in the last 8760 hours.   D-Dimer No results for input(s): DDIMER in the last 72 hours. Hemoglobin A1C No results for input(s): HGBA1C in the last 72 hours. Fasting Lipid Panel No results for input(s): CHOL, HDL, LDLCALC, TRIG, CHOLHDL, LDLDIRECT in the last 72 hours. Thyroid Function Tests No results for input(s): TSH, T4TOTAL, T3FREE, THYROIDAB in the last 72 hours.  Invalid input(s): FREET3  Other results:  Imaging   No results found.  Medications:     Scheduled Medications: . amiodarone  200 mg Oral BID  . bisacodyl  10 mg Oral Daily  . dronabinol  2.5 mg Oral BID AC  . famciclovir  250 mg Oral Daily  . feeding supplement (ENSURE ENLIVE)  237 mL Oral BID BM  . gabapentin  800 mg Oral QHS  . hydrocortisone   Rectal TID  . insulin aspart  0-15 Units Subcutaneous TID WC  . insulin aspart  0-5 Units Subcutaneous QHS  . insulin aspart  5 Units Subcutaneous TID WC  . magnesium oxide  400 mg Oral Daily  . Melatonin  6 mg Oral QHS  . pantoprazole  40 mg Oral Daily  . polyethylene glycol  17 g Oral Daily  . rosuvastatin  10 mg Oral Daily  . senna-docusate  2 tablet Oral BID  . sertraline  25 mg Oral Daily  . sodium chloride flush  3 mL  Intravenous Q12H  . Tafamidis Meglumine (Cardiac)  4 capsule Oral Daily  . traZODone  50 mg Oral QHS    Infusions: . sodium chloride Stopped (11/20/17 1640)  . milrinone 0.125 mcg/kg/min (11/25/17 0500)    PRN Medications: sodium chloride, acetaminophen, bisacodyl, LORazepam, morphine injection, ondansetron (ZOFRAN) IV, simethicone, sodium chloride flush, zolpidem  Patient Profile   Dillon Andrews is a 77 y.o. male with a hx of chronic combined CHF due to NICM, CKD III,presumedNASH(per pt, never proven),esophageal varices per chart(prior glass of wine occasionally but no heavy alcohol),HTN, HLD, DM, Barrett's esophagus, GERD, anemia, RBBB, TTR amyloidosis, and Kappa Light Chain myeloma.  Admitted to [facility] 11/05/17 in setting of new Afib with RVR.   Assessment/Plan   1. Acute on chronic combined HF with TTR amyloidosis (Wild Type) - NICM by cath at [facility] 09/2016 with EF 40-45%. Echo 06/2017: EF 25-30%, mild LAE, mild/mod RV dilation, mod/severe RA. - PYP scan strongly suggestive of TTR amyloid, SPEP with 0.8% m-spike, UPEP with Bence Jones Protein positive, kappa type. 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 - Dr Dillon Andrews saw and thinks he does not have AL amyloid (just multiple myeloma) and that he has TTR amyloid and should be treated with tafamidis before chemo. Tafamidis started on 8/2.   Off milrinone 8/3. Milrinone restarted 8/7. Off Norep 8/6. - Torsemide held with bleeding. Cr 1.34 today.  - Continue milrinone 0.125 mcg. Have decided on home milrinone and [facility] has been contacted.  He will have tunneled catheter placed tomorrow morning, ordered.  - Continue to hold spiro.  - No ARB/entresto with for now with CKD - SBP soft 90-100s. Hold off on hydralazine/imdur  2. Sepsis - Afebrile. WBC 4.8.  - Has completed Vanc and Cefepine. - 8/5 Blood culture- NGTD - PICC removed. Repeat blood cultures done 8/9.  - Will need tunneled line for milrinone. Ordered for today  tentatively. Will discuss timing with Andrews.   3. New onset paroxysmal AFlutter  - likely triggered HF decompensation  - s/p TEE DCCV 7/29.   - Back in atrial flutter 7/31, repeat DCCV on 8/2. Converted back to aflutter 8/5. - Maintaining NSR - Continue amio 200 mg BID - Holding eliquis with rectal bleed.    4. AKI on CKD III, baseline ~1.6 - Creatinine stable to improved.  - Torsemide on hold with bleeding.   5. Recurrent bilateral effusions - Somewhat increased from March on CXR 11/05/17. No change.  6. Kappa Lightchain Myeloma - Follows with Dr. Marin Andrews who has seen this admission.  - Started on Bortezomib7/18/18andRevlimid7/23/19. - Continue management per primary team. -  Dr Dillon Andrews thinks he does not have AL amyloid and that he does have TTR amyloid and should be treated with tafamodis before chemo. Now on Tafamidis. No change.   7. NASH with esophageal varices.  - Watch closely for bleeding with AC. Hemoglobin down to 8.8, but with melena. GI following.  - Last EGD 06/21/2014 at [facility] for surveillance of Esophageal varices, Barrett's esophagus, and PMHx of Colon polyps. Showed two columns of small esophageal varices w/out stigmata, short segment Barrett's esophagus (without biopsies, due to presence of EVs), 5cm hiatal hernia, and hyperplastic small gastric nodules.   8. DM2 - Per primary. No change.    9. Hypokalemia. - K 3.6 this am. Follow.   11. Rectal bleeding -> melena - History of hemorrhoids, more bleeding than typical for him this morning but with large BM.  Hgb 10.1 -> 9.4 -> 8.8 - Hold eliquis and torsemide.  - GI following for ?EGD/Colon 11/26/17 - Given Right heart failure, pt would be at least high risk of perioperative complications from a cardiac standpoint. Will discuss further with Andrews.   12. DNR/DNI  Length of Stay: [place], [place]  7:49 AM  Advanced Heart Failure Team Pager [phone] (M-F; 7a - 4p)  Please contact [facility] for night-coverage after hours (4p -7a ) and weekends on [email or web address]  Patient seen and examined with the above-signed Advanced Practice Provider and/or Housestaff. I personally reviewed laboratory data, imaging studies and relevant notes. I independently examined the patient and formulated the important aspects of the plan. I have edited the note to reflect any of my changes or salient points. I have personally discussed the plan with the patient and/or family.  Still with mild episodes of GI bleeding but seems to be slowing down. Hgb down slightly. Symptomatically improved with NS yesterday. Remains on milrinone for right heart support. Will continue to follow. Hold diuretics. I discussed with GI. Given tenuous HF and limited prognosis would avoid colonoscopy if possible and can likely get needed answer with EGO and flex sig. They will evaluate. Ideally would like to maintain [facility] if possible with recent DC-CV for AFL. Place SCDs while off AC. Hold on placing PICC for now.   Glori Bickers, Andrews  8:47 AM

## 2017-11-25 NOTE — Progress Notes (Signed)
Patient was assisted to bathroom had two small clots with small amount blood in commode.  Patient denies feeling weak or dizziness.  Denies pain.

## 2017-11-25 NOTE — Progress Notes (Addendum)
PROGRESS NOTE    Dillon Andrews  MRN:[id number] DOB: 10/28/1940 DOA: 11/05/2017 PCP: Kremer, William Alfred, MD    Brief Narrative:  77-year-old male with past medical history of liver cirrhosis with esophageal varices, combined systolic and diastolic CHF with ejection fraction of 25 to 30%, nonischemic cardiomyopathy, pulmonary hypertension, cardiac amyloidosis, CKD stage III, kappa light chain myeloma on chemotherapy was brought to the emergency department due to shortness of breath. Patient was found to be in A. fib with RVR with hypotension on presentation. Cardiology was consulted. Started on amiodarone drip. Also started on heparin for new onset A. fib. CHF team consulted and continue to assist with management.  Underwent cardioversion on 7/29.   11/18/2017 patient was transferred to 2H for close monitoring since he became hypotensive and had episode of bradycardia.  He was started on pressors briefly with improvement in his blood pressure. Lactic acid level 2.1 procalcitonin 0.44 up to 9.48.  he was started empirically on IV vancomycin and cefepime. Wbc count is wnl. Improving pro calcitonin level. Blood cultures are negative. Palliative care team on board and following the patient.   11/24/2017: Hospital course complicated by worsening rectal bleeding.  Eliquis held.  [facility] consulted.GI following     Assessment & Plan:   Principal Problem:   Atrial fibrillation with RVR (HCC) Active Problems:   CKD (chronic kidney disease) stage 3, GFR 30-59 ml/min (HCC), baseline Cr 1.5   DM2 (diabetes mellitus, type 2) (HCC)   Acute on chronic combined systolic and diastolic CHF (congestive heart failure) (HCC)   Bilateral pleural effusion   Esophageal varices in cirrhosis (HCC)   SOB (shortness of breath)   Non-ischemic cardiomyopathy (HCC)   Other secondary pulmonary hypertension (HCC)   RBBB   Cardiac amyloidosis (HCC)   Weakness generalized   Kappa light chain myeloma (HCC)  Leukocytosis   Elevated troponin   Elevated brain natriuretic peptide (BNP) level, acute on chronic   Hypotension   Palliative care by specialist   Cardiogenic shock (HCC)   Rectal bleeding  Acute on chronic systolic and diastolic heart failure and TTR amyloidosis:  NICM cath at [facility] 09/2016 with EF 40-45%.  EF=10% on recent TEE 2D echo 06/2017 showed EF of 25 to 30% with mild LAE, mild to moderate RV dilation moderate to severe RA.  PYP scan suggestive of TTR amyloid.   SPEP with 0.8% M spike, UPEP with Bence-Jones protein positive, kappa type.  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 Patient is being followed by Dr Ennever  , according to his assessment hedoes not have AL amyloid (just multiple myeloma) and that he has TTR amyloid and should be treated with Tafamidis before chemo Tafamidis started on 8/2. Milrinone started by heart failure team on 8/7.  Further management as per cardiology.  Remains mildly volume overloaded.  Torsemide 20 mg daily started 8/10. Given melena, held pressure on the soft side but asymptomatic today.  Creatinine fluctuating and slightly up compared to yesterday from 1.5 > 1.66>1.34.  Continue to trend BMP.He is on a line holiday, cultures are negative and plan is for tunneled catheter placement (postponed 8/12 due to worsening rectal bleeding) for home milrinone as per heart failure team. Holding Aldactone, no ARB/Entresto for now due to CKD.  Holding BiDil due to soft blood pressures. Holding torsemide today due to rectal bleeding and symptomatic/dizziness.  New onset paroxysmal atrial flutter/fib with RVR. Status post TEE DCCV on 7/29, briefly returned to normal sinus rhythm  Back   in atrial flutter 7/31, repeat DCCV on 8/2. Converted back to aflutter 8/5. CHADS VASC 6, resume Eliquis if hemoglobin is stable and okay with gastroenterology. Not an ablation candidate.  Continue amiodarone 200 mg twice daily. Having worsening rectal bleeding since overnight  8/11.  Eliquis held.gastroenterology consulted  NASH Liver cirrhosis with esophageal varices:  Last EGD at [facility] showed esophageal varices and Barretts esophagus.  Follow up with Gi as outpatient , PPI.  [facility] consulted 8/12  Painless hematochezia, probably lower GI, secondary to diverticulosis, also in the differential is  AVM, neoplasm Perform timed RBC scan if moderate amount of bleeding EGD colonoscopy deferred as she strongly suspect diverticular vs  hemorrhoidal bleeding  follow CBC closely No plans for  endoscopic procedure given high cardiac risk  Kappa light chain myeloma:  Follow up Dr. Ennever, just had chemotherapy few days ago, was started on bortezomib on 10/30/2016 and Revlimid on 11/04/2017 -Dr. Ennever following. Now on Tafamidis for Cardiac Amyloid.   Acute kidney injury on stage 3 CKD:  Baseline creatinine is around 1.6,  Suspect from hypotension, diuretics, spironolactone.  Creatinine appears to be at baseline and fluctuating over the last 3 days in the 1.5-1.6 range.  Continue to monitor closely while on diuretics.  Stable.  Type II DM with renal complications A1c is 7.5.  Remains on SSI and mealtime NovoLog with reasonable inpatient control.  Continue management.  Sepsis  Unclear etiology.  ? picc line infecton.  Blood cultures x2 from 8/5: Negative, final report.  Blood cultures x2 from 8/9: Negative to date.  MRSA PCR negative. RUQ is negative for acute cholecystitis.  CXR shows resolution of the right effusion.  UA is unremarkable.  He is afebrile and wbc count normalized.  Patient completed 4 days course of IV cefepime and vancomycin which were stopped 8/8.  Stable.    Normocytic anemia May be related to chronic kidney disease or anemia of chronic disease. After the melena episode patient's hemoglobin dropped to 8.8. Iron studies show INR 44, TIBC 347, ferritin 109 Workup as above Serial CBC    Thrombocytopenia:  Resolved on 8/12 labs.  Follow  CBC.  Rectal bleeding/hemorrhoids. Patient reports history of hemorrhoidal ble patient had mild rectal bleeding for the last couple of days, this worsened in the last 24 hours more so overnight 8/11.  Painless.  Small to medium sized blood clots noted.  Patient having soft BMs with bowel regimen.   Patient and family report colonoscopy couple years ago  Colonoscopy 10/2015 showed one small polyp, Grade 2 hemorrhoids and sigmoid diverticulosis.  EGD 10/2015 showed small gastric nodules, 5 cm HH, no varices, no gastropathy Continue bowel regimen and [facility] consulted,    Cardiology giving a 500 mL saline bolus. DD: Hemorrhoid, diverticular, variceal    DVT prophylaxis: Eliquis.  Code Status: FULL CODE.  Family Communication: Discussed in detail with patient's daughter at bedside.  Updated care and answered questions. Disposition Plan: To be determined pending clinical improvement and cardiology recommendations.  Consultants:   Oncology Dr Ennever  Heart failure Team. Dr Bensihmon.   Palliative care consult.   Procedures:   none.   Antimicrobials:  Off antibiotics since 8/8  Subjective:  patient states that he has had no recurrence off melena , saw some bright red blood per rectum this morning Denies any chest pain shortness of breath   Objective: Vitals:   11/24/17 2340 11/25/17 0350 11/25/17 0500 11/25/17 0802  BP: (!) 110/58 (!) 96/54  (!)   125/102  Pulse: 66 64  64  Resp: 14 18  (!) 23  Temp: (!) 97.2 F (36.2 C) 97.7 F (36.5 C)  97.8 F (36.6 C)  TempSrc: Axillary Oral  Oral  SpO2: 99% 100%  95%  Weight:   79 kg   Height:        Intake/Output Summary (Last 24 hours) at 11/25/2017 0831 Last data filed at 11/25/2017 0500 Gross per 24 hour  Intake 1222.16 ml  Output 1025 ml  Net 197.16 ml   Filed Weights   11/23/17 0430 11/24/17 0558 11/25/17 0500  Weight: 78.9 kg 74.8 kg 79 kg    Examination:  General exam: Pleasant elderly male,  moderately built and nourished, sitting up comfortably in chair this morning. Respiratory system: Clear to auscultation.  No increased work of breathing. Cardiovascular system: S1 and S2 heard, RRR.  No JVD, murmurs and trace ankle edema present.  Telemetry personally reviewed and showed SR with BBB morphology.  Stable Gastrointestinal system: Abdomen is soft non tender non distended,   Normal bowel sounds heard.  Stable Central nervous system: Alert and oriented. Non focal.  Stable Extremities: Moves all limbs symmetrically. Psychiatry: . Mood & affect appropriate.   Data Reviewed: I have personally reviewed following labs and imaging studies  CBC: Recent Labs  Lab 11/23/17 1545 11/24/17 0158 11/24/17 0806 11/24/17 2038 11/25/17 0332  WBC 5.3 5.6 6.7 4.9 4.8  NEUTROABS  --   --   --   --  3.2  HGB 10.0* 9.4* 10.7* 9.1* 8.8*  HCT 32.5* 30.9* 35.4* 29.7* 28.5*  MCV 90.5 90.1 91.0 91.1 90.8  PLT 122* 134* 176 157 142*   Basic Metabolic Panel: Recent Labs  Lab 11/21/17 0729 11/22/17 0230 11/23/17 0718 11/24/17 0158 11/25/17 0332  NA 139 137 141 141 139  K 4.5 4.3 4.2 3.5 3.6  CL 96* 95* 97* 96* 96*  CO2 33* 34* 34* 36* 35*  GLUCOSE 119* 115* 95 126* 148*  BUN 38* 29* 29* 24* 17  CREATININE 1.67* 1.50* 1.66* 1.53* 1.34*  CALCIUM 9.0 8.7* 8.6* 8.3* 8.3*   GFR: Estimated Creatinine Clearance: 45.4 mL/min (A) (by C-G formula based on SCr of 1.34 mg/dL (H)). Liver Function Tests: Recent Labs  Lab 11/19/17 0401 11/20/17 0500  AST 37 33  ALT 39 36  ALKPHOS 206* 190*  BILITOT 1.7* 1.6*  PROT 6.0* 5.8*  ALBUMIN 2.7* 2.6*    Recent Labs  Lab 11/19/17 1128  AMMONIA 38*   CBG: Recent Labs  Lab 11/24/17 2139 11/24/17 2204 11/24/17 2232 11/24/17 2340 11/25/17 0813  GLUCAP 56* 69* 105* 164* 98     Recent Results (from the past 240 hour(s))  Culture, blood (Routine X 2) w Reflex to ID Panel     Status: None   Collection Time: 11/17/17 11:16 AM  Result Value  Ref Range Status   Specimen Description BLOOD LEFT ANTECUBITAL  Final   Special Requests   Final    BOTTLES DRAWN AEROBIC AND ANAEROBIC Blood Culture adequate volume   Culture   Final    NO GROWTH 5 DAYS Performed at [facility], [address] [place], [place] [address]    Report Status 11/22/2017 FINAL  Final  Culture, blood (Routine X 2) w Reflex to ID Panel     Status: None   Collection Time: 11/17/17 11:22 AM  Result Value Ref Range Status   Specimen Description BLOOD LEFT HAND  Final   Special Requests     Final    BOTTLES DRAWN AEROBIC AND ANAEROBIC Blood Culture adequate volume   Culture   Final    NO GROWTH 5 DAYS Performed at [facility], [address] [place], [place] [address]    Report Status 11/22/2017 FINAL  Final  MRSA PCR Screening     Status: None   Collection Time: 11/17/17  8:27 PM  Result Value Ref Range Status   MRSA by PCR NEGATIVE NEGATIVE Final    Comment:        The GeneXpert MRSA Assay (FDA approved for NASAL specimens only), is one component of a comprehensive MRSA colonization surveillance program. It is not intended to diagnose MRSA infection nor to guide or monitor treatment for MRSA infections. Performed at [facility], [address] [place], [place] [address]   Culture, blood (routine x 2)     Status: None (Preliminary result)   Collection Time: 11/21/17  5:20 AM  Result Value Ref Range Status   Specimen Description BLOOD RIGHT ARM  Final   Special Requests   Final    BOTTLES DRAWN AEROBIC ONLY Blood Culture adequate volume   Culture   Final    NO GROWTH 3 DAYS Performed at [facility], [address] [place], [place] [address]    Report Status PENDING  Incomplete  Culture, blood (routine x 2)     Status: None (Preliminary result)   Collection Time: 11/21/17  5:25 AM  Result Value Ref Range Status   Specimen Description BLOOD RIGHT ARM  Final   Special Requests   Final    BOTTLES DRAWN AEROBIC ONLY  Blood Culture adequate volume   Culture   Final    NO GROWTH 3 DAYS Performed at [facility], [address] [place], [place] [address]    Report Status PENDING  Incomplete         Radiology Studies: No results found.      Scheduled Meds: . amiodarone  200 mg Oral BID  . bisacodyl  10 mg Oral Daily  . dronabinol  2.5 mg Oral BID AC  . famciclovir  250 mg Oral Daily  . feeding supplement (ENSURE ENLIVE)  237 mL Oral BID BM  . gabapentin  800 mg Oral QHS  . hydrocortisone   Rectal TID  . insulin aspart  0-15 Units Subcutaneous TID WC  . insulin aspart  0-5 Units Subcutaneous QHS  . magnesium oxide  400 mg Oral Daily  . Melatonin  6 mg Oral QHS  . pantoprazole  40 mg Oral Daily  . polyethylene glycol  17 g Oral Daily  . rosuvastatin  10 mg Oral Daily  . senna-docusate  2 tablet Oral BID  . sertraline  25 mg Oral Daily  . sodium chloride flush  3 mL Intravenous Q12H  . Tafamidis Meglumine (Cardiac)  4 capsule Oral Daily  . traZODone  50 mg Oral QHS   Continuous Infusions: . sodium chloride Stopped (11/20/17 1640)  . milrinone 0.125 mcg/kg/min (11/25/17 0500)     LOS: 20 days    Time spent: 35 Minutes.      , MD,  [facility]    If 7PM-7AM, please contact night-coverage [email or web address] Password [email or web address] 11/25/2017, 8:31 AM   

## 2017-11-25 NOTE — Progress Notes (Signed)
[facility] Coordinator continues to follow pt with HF team to support transition home on Milrinone when deemed clinically ready. Teaching has been provided with pt and daughter, Ivin Booty.    If patient discharges after hours, please call [phone].   Larry Sierras 11/25/2017, 11:33 PM

## 2017-11-25 NOTE — Progress Notes (Signed)
Patient CBG rechecked 56 slowly coming up was given more juice with sugar added.  Patient alert & orient no complaints will recheck CBG.

## 2017-11-25 NOTE — Progress Notes (Signed)
CBG= 164.  Patient resting comfortably at this time without distress or discomfort noted.   Will continue to monitor.

## 2017-11-25 NOTE — Progress Notes (Signed)
Patient has no new bleeding, no clots, and denies pain at this time.  Patient on clear liquid diet.  CBG 44 asymptomatic given juice with sugar will recheck CBG.  Daughter at the bedside with patient.

## 2017-11-25 NOTE — Progress Notes (Signed)
Dillon Andrews is now down in the cardiac stepdown unit.  His heart seems to be doing pretty well.  However, looks like he had an episode of melena.  His hemoglobin has dropped.  His hemoglobin today was 8.8.  I think this melena is significant.  I think it would be worthwhile to do a colonoscopy to see where he may have bled from.  I will check iron studies on him.  On his labs today, his white count is 4.8.  Hemoglobin 8.8.  Platelet count 142,000.  His sodium is 139.  Potassium 3.6.  Creatinine 1.34.  His erythropoietin level from 2 months ago was 58.  Again, looks like his heart arrhythmia is doing pretty well.  His blood pressure is 96/54.  He says he feels pretty good.  He has had no nausea or vomiting.  There is no obvious abdominal pain.  There is been no fever.  Again, he had some melena.  We will see what his iron studies show.  He may need a dose of IV iron.  Lattie Haw, MD  Hebrews 12:12

## 2017-11-25 NOTE — Progress Notes (Signed)
CBG 105 after checked patient remains alert & oriented, stated felt a little weak.  No bleeding observed from rectum noted.  Will continue to monitor and will recheck CBG.

## 2017-11-25 NOTE — Progress Notes (Addendum)
Patient CBG 69 rechecked remains asymptomatic encouraged to drink more juice and will recheck CBG shortly.

## 2017-11-25 NOTE — Progress Notes (Addendum)
Daily Rounding Note  11/25/2017, 9:51 AM  LOS: 20 days   SUBJECTIVE:   Chief complaint: hematochezia occurred again this AM, small volume with his BM.  No spontaneous bleeding from rectum otherwise.  Denies SOB or dizziness.       Synopsis of records sent from [facility] GI MD, Dr Mitchell Heir 07/2015 EGD for esophageal variceal surveillance and history of Barrett's esophagus, hx benign gastric polyps.    Showed medium hiatal hernia.  Salmon-colored, non-nodular, mucosa at distal esophagus suggesting short segment Barrett's esophagus.  2 cords of small esophageal varices without stigmata of bleeding.  Two, 16m polyps seen in the gastric cardia and biopsied.  No gastric varices, no portal hypertensive gastropathy, no GAVE.  5 mm nodule in duodenal bulb biopsied. Bulbar nodule pathology was benign small intestinal mucosa with gastric glands consistent with gastric heterotopia.  Gastric polyps were hyperplastic, another gastric polyp showed granulation tissue and acute inflammation.  Pathology from the distal esophagus showed benign squamocolumnar mucosa with mild, chronic inflammation but no evidence of Barrett's metaplasia or dysplasia. 10/2015 EGD for variceal surveillance.  [facility]   5 cm HH.  Small nodules at proximal gastric corpus and fundus, as seen previously, no varices, no GAVE, no Portal hypertensive gastropathy.   10/2015 Colonoscopy for polyp surveillance, personal hx colon polyps.  6 mm, left: Polyp cold snared.  Lipoma adjacent to this polyp.  Grade 2 internal hemorrhoids.  Sigmoid diverticulosis.  Excellent bowel prep.  OBJECTIVE:         Vital signs in last 24 hours:    Temp:  [97.2 F (36.2 C)-99.3 F (37.4 C)] 97.8 F (36.6 C) (08/13 0802) Pulse Rate:  [56-66] 64 (08/13 0802) Resp:  [14-24] 23 (08/13 0802) BP: (91-125)/(54-102) 125/102 (08/13 0802) SpO2:  [95 %-100 %] 95 % (08/13 0802) Weight:  [79 kg]  79 kg (08/13 0500) Last BM Date: 11/24/17 Filed Weights   11/23/17 0430 11/24/17 0558 11/25/17 0500  Weight: 78.9 kg 74.8 kg 79 kg   General: looks good, much metter in person than on "paper"   Heart: RRR in 60s Chest: clear bil, reduced BS on left.  No dyspnea or cough Abdomen: soft, NT, ND.  Active BS  Extremities: no CCE Neuro/Psych:  Alert, oriented x 3.  HOH.  Moves all 4 limbs, no tremors.    Intake/Output from previous day: 08/12 0701 - 08/13 0700 In: 16384[P.O.:1160; I.V.:574] Out: 1025 [Urine:1025]  Intake/Output this shift: No intake/output data recorded.  Lab Results: Recent Labs    11/24/17 0806 11/24/17 2038 11/25/17 0332  WBC 6.7 4.9 4.8  HGB 10.7* 9.1* 8.8*  HCT 35.4* 29.7* 28.5*  PLT 176 157 142*   BMET Recent Labs    11/23/17 0718 11/24/17 0158 11/25/17 0332  NA 141 141 139  K 4.2 3.5 3.6  CL 97* 96* 96*  CO2 34* 36* 35*  GLUCOSE 95 126* 148*  BUN 29* 24* 17  CREATININE 1.66* 1.53* 1.34*  CALCIUM 8.6* 8.3* 8.3*   LFT No results for input(s): PROT, ALBUMIN, AST, ALT, ALKPHOS, BILITOT, BILIDIR, IBILI in the last 72 hours. PT/INR No results for input(s): LABPROT, INR in the last 72 hours. Hepatitis Panel No results for input(s): HEPBSAG, HCVAB, HEPAIGM, HEPBIGM in the last 72 hours.  Studies/Results: No results found.   Scheduled Meds: . amiodarone  200 mg Oral BID  . bisacodyl  10 mg Oral Daily  .  dronabinol  2.5 mg Oral BID AC  . famciclovir  250 mg Oral Daily  . feeding supplement (ENSURE ENLIVE)  237 mL Oral BID BM  . gabapentin  800 mg Oral QHS  . hydrocortisone   Rectal TID  . insulin aspart  0-15 Units Subcutaneous TID WC  . insulin aspart  0-5 Units Subcutaneous QHS  . magnesium oxide  400 mg Oral Daily  . Melatonin  6 mg Oral QHS  . pantoprazole  40 mg Oral Daily  . polyethylene glycol  17 g Oral Daily  . rosuvastatin  10 mg Oral Daily  . senna-docusate  2 tablet Oral BID  . sertraline  25 mg Oral Daily  . sodium  chloride flush  3 mL Intravenous Q12H  . Tafamidis Meglumine (Cardiac)  4 capsule Oral Daily  . traZODone  50 mg Oral QHS   Continuous Infusions: . sodium chloride Stopped (11/20/17 1640)  . milrinone 0.125 mcg/kg/min (11/25/17 0500)   PRN Meds:.sodium chloride, acetaminophen, bisacodyl, LORazepam, morphine injection, ondansetron (ZOFRAN) IV, simethicone, sodium chloride flush, zolpidem  ASSESMENT:    *    Painless hematochezia.  In the setting of Eliquis.  Rule out neoplasia, rule out AVM, rule out stercoral ulcer.   This looks like lower GIB. Colonoscopy in 2017 showed colon polyp, lipoma, internal hemorrhoids and diverticulosis.     EGD 2017 showed no varices, no portal gastropathy, no lingering changes suspicious for Barretts, + for recurrent benign gastric polyps.    *  Chronic anemia.  Hgb steadily declining over several days.  Not in need of blood transfusions, none to date.    *    Hx NASH, ? Cirrhosis.  with esophageal varices in 2016, it is likely he does have cirrhosis.    *     New onset A. fib with RVR.  Mintaining sinus rhythm following DCCV 8/2.    *    Combined CHF.  EF by TEE was 10% on 7/29. sxs improved on Milrinone.   Dr. Marin Olp feels that this is TTR rather than AL amyloid  *    Multiple myeloma.  Began chemotherapy late July 2019.  *   CKD, AKI.  improved.     PLAN   *  Add HC suppositories BID.  Advance diet to CM/HH.  No plans for EGD or Colonoscopy.    *  ? When/if to restart Eliquis since he is maintaining NSR since DCCV?   Azucena Freed  11/25/2017, 9:51 AM Phone [phone]     Attending physician's note   I have taken an interval history, reviewed the chart and examined the patient. I agree with the Advanced Practitioner's note, impression and recommendations.   Discussed with Dr. [name]: EF=10% on recent TEE. Small volume of blood with a formed bowel movement today. Hb=8.8. Colonoscopy 10/2015 showed one small polyp, Grade 2  hemorrhoids and sigmoid diverticulosis.  EGD 10/2015 showed small gastric nodules, 5 cm HH, no varices, no gastropathy.   Strongly suspect a diverticular bleed or hemorrhoidal bleed. No plans for endoscopic procedures at this time given results of 2017 colonoscopy/EGD and high cardiac risk. Advance diet as tolerated. Stool softener and Anusol HC supp bid. Would keep off Eliquis at least 5 more days, if possible, to reduce risk of rebleed.   Lucio Edward, MD FACG [phone] office

## 2017-11-25 NOTE — Progress Notes (Signed)
Inpatient Diabetes Program Recommendations  AACE/ADA: New Consensus Statement on Inpatient Glycemic Control (2019)  Target Ranges:  Prepandial:   less than 140 mg/dL      Peak postprandial:   less than 180 mg/dL (1-2 hours)      Critically ill patients:  140 - 180 mg/dL  Results for Dillon Andrews, Dillon Andrews (MRN [id number]) as of 11/25/2017 07:26  Ref. Range 11/24/2017 07:57 11/24/2017 11:26 11/24/2017 17:21 11/24/2017 21:19 11/24/2017 21:39 11/24/2017 22:04 11/24/2017 22:32 11/24/2017 23:40  Glucose-Capillary Latest Ref Range: 70 - 99 mg/dL 99 140 (H) 170 (H)  Novolog 8 units @ 18:32  44 (LL) 56 (L) 69 (L) 105 (H) 164 (H)    Review of Glycemic Control  Current orders for Inpatient glycemic control: Novolog 0-15 units TID with meals, Novolog 0-5 units QHS, Novolog 5 units TID with meals  Inpatient Diabetes Program Recommendations: Insulin - Meal Coverage: Noted patient received Novolog meal coverage with supper at 18:32 and experienced hypoglycemia at 21:19. Please discontinue meal coverage insulin.  Thanks, Barnie Alderman, RN, MSN, CDE Diabetes Coordinator Inpatient Diabetes Program [phone] (Team Pager from 8am to 5pm)

## 2017-11-25 NOTE — Progress Notes (Signed)
CBG= 164. Alert & oriented denies discomfort.

## 2017-11-26 ENCOUNTER — Inpatient Hospital Stay (HOSPITAL_COMMUNITY): Payer: Medicare HMO

## 2017-11-26 ENCOUNTER — Encounter (HOSPITAL_COMMUNITY): Payer: Medicare HMO

## 2017-11-26 DIAGNOSIS — D62 Acute posthemorrhagic anemia: Secondary | ICD-10-CM

## 2017-11-26 DIAGNOSIS — I5023 Acute on chronic systolic (congestive) heart failure: Secondary | ICD-10-CM

## 2017-11-26 HISTORY — PX: IR FLUORO GUIDE CV LINE RIGHT: IMG2283

## 2017-11-26 HISTORY — PX: IR US GUIDE VASC ACCESS RIGHT: IMG2390

## 2017-11-26 LAB — CBC
HCT: 31.4 % — ABNORMAL LOW (ref 39.0–52.0)
HEMATOCRIT: 26.8 % — AB (ref 39.0–52.0)
HEMOGLOBIN: 8.3 g/dL — AB (ref 13.0–17.0)
HEMOGLOBIN: 9.8 g/dL — AB (ref 13.0–17.0)
MCH: 27.9 pg (ref 26.0–34.0)
MCH: 28.6 pg (ref 26.0–34.0)
MCHC: 31 g/dL (ref 30.0–36.0)
MCHC: 31.2 g/dL (ref 30.0–36.0)
MCV: 90.2 fL (ref 78.0–100.0)
MCV: 91.5 fL (ref 78.0–100.0)
PLATELETS: 179 10*3/uL (ref 150–400)
Platelets: 151 10*3/uL (ref 150–400)
RBC: 2.97 MIL/uL — ABNORMAL LOW (ref 4.22–5.81)
RBC: 3.43 MIL/uL — AB (ref 4.22–5.81)
RDW: 18.3 % — ABNORMAL HIGH (ref 11.5–15.5)
RDW: 17.9 % — ABNORMAL HIGH (ref 11.5–15.5)
WBC: 5.3 10*3/uL (ref 4.0–10.5)
WBC: 5.6 10*3/uL (ref 4.0–10.5)

## 2017-11-26 LAB — COMPREHENSIVE METABOLIC PANEL
ALK PHOS: 160 U/L — AB (ref 38–126)
ALT: 26 U/L (ref 0–44)
AST: 30 U/L (ref 15–41)
Albumin: 2.6 g/dL — ABNORMAL LOW (ref 3.5–5.0)
Anion gap: 8 (ref 5–15)
BILIRUBIN TOTAL: 0.8 mg/dL (ref 0.3–1.2)
BUN: 17 mg/dL (ref 8–23)
CALCIUM: 8.5 mg/dL — AB (ref 8.9–10.3)
CO2: 34 mmol/L — ABNORMAL HIGH (ref 22–32)
CREATININE: 1.36 mg/dL — AB (ref 0.61–1.24)
Chloride: 96 mmol/L — ABNORMAL LOW (ref 98–111)
GFR calc non Af Amer: 48 mL/min — ABNORMAL LOW (ref >60)
GFR, EST AFRICAN AMERICAN: 56 mL/min — AB (ref >60)
GLUCOSE: 122 mg/dL — AB (ref 70–99)
Potassium: 3.7 mmol/L (ref 3.5–5.1)
Sodium: 138 mmol/L (ref 135–145)
TOTAL PROTEIN: 5.4 g/dL — AB (ref 6.5–8.1)

## 2017-11-26 LAB — PSA, TOTAL AND FREE
PSA, Free Pct: 9.8 %
PSA, Free: 0.48 ng/mL
Prostate Specific Ag, Serum: 4.9 ng/mL — ABNORMAL HIGH (ref 0.0–4.0)

## 2017-11-26 LAB — CULTURE, BLOOD (ROUTINE X 2)
CULTURE: NO GROWTH
Culture: NO GROWTH
SPECIAL REQUESTS: ADEQUATE
Special Requests: ADEQUATE

## 2017-11-26 LAB — GLUCOSE, CAPILLARY
GLUCOSE-CAPILLARY: 127 mg/dL — AB (ref 70–99)
GLUCOSE-CAPILLARY: 183 mg/dL — AB (ref 70–99)
GLUCOSE-CAPILLARY: 139 mg/dL — AB (ref 70–99)
GLUCOSE-CAPILLARY: 159 mg/dL — AB (ref 70–99)
GLUCOSE-CAPILLARY: 115 mg/dL — AB (ref 70–99)
Glucose-Capillary: 119 mg/dL — ABNORMAL HIGH (ref 70–99)

## 2017-11-26 LAB — PREPARE RBC (CROSSMATCH)

## 2017-11-26 MED ORDER — SODIUM CHLORIDE 0.9% IV SOLUTION
Freq: Once | INTRAVENOUS | Status: AC
  Administered 2017-11-26: 11:00:00 via INTRAVENOUS

## 2017-11-26 MED ORDER — LIDOCAINE HCL 1 % IJ SOLN
INTRAMUSCULAR | Status: AC
  Filled 2017-11-26: qty 20

## 2017-11-26 MED ORDER — LIDOCAINE HCL (PF) 1 % IJ SOLN
INTRAMUSCULAR | Status: DC | PRN
  Administered 2017-11-26: 5 mL

## 2017-11-26 NOTE — Progress Notes (Signed)
[facility] PROGRESS NOTE  MONROE QIN IRC:[id number] DOB: Aug 04, 1940 DOA: 11/05/2017 PCP: [name], MD   Narrative: 77 year old male Cirrhosis + varices followed by Dr. Marin Comment in [place] Combined diastolic systolic heart failure EF 25-30%, pulmonary HTN, cardiac amyloid, CKD 3 K light chain myeloma on chemo Admitted on 7/24 with increasing lethargy poor appetite On admission found to be A. fib RVR EKG showed A. fib with RVR cardiology consulted started on amiodarone paren and cardioverted 7/29 Hospitalization complicated by hypotension bradycardia and was transiently on pressors Found to have worsening rectal bleeding Eliquis was held   A & Plan Acute/chronic systolic heart failure with amyloidosis TEE showed EF 10%-on milrinone, Tafamidis, other medications being directed by cardiology-pick was discontinued he had a line holiday until 8/14 Management per them  Atrial fibrillation paroxysmal chads score 6 not ablation candidate-DCCV 7/29, 8/2 continue amiodarone-Eliquis as per cardiology  ?  PICC line infection/sepsis-blood cultures X2 8/5-8/9- completed IV cefepime and vancomycin-no further work-up  K light chain myeloma-Per oncology Dr. Eduardo Osier liver cirrhosis plus varices with history of Barrett's esophagus-rectal bleeding appears lower GI bleed no further work-up needed at this time Eliquis as per cardiology-thought to be hemorrhoidal bleed [colonoscopy 10/2015 1 polyp and grade 2 hemorrhoids diverticulosis, EGD 10/2015 small gastric nodules 5 cm hiatal hernia]  Global-palliative care is seen the patient and it looks like patient wishes full scope of treatment including myeloma treatment   DVT prophylaxis: SCD code Status: DNAR family Communication: None disposition Plan: None   Miel Wisener, MD  [facility] Direct contact: [phone] --Via amion app OR  --[email or web address]; password [email or web address]  7PM-7AM contact night coverage as above 11/26/2017, 3:05  PM  LOS: 21 days   Consultants:  Cardiology  Gastroenterology  Procedures:  Multiple  Antimicrobials:  Completed as above  Interval history/Subjective: Awake alert pleasant seems to understand current state of affairs guarding his medical conditions No further dark stools No vomiting Tolerating some diet  Objective:  Vitals:  Vitals:   11/26/17 1213 11/26/17 1351  BP: 107/61 110/61  Pulse: 63 (!) 59  Resp: (!) 21 (!) 24  Temp: 98.7 F (37.1 C) 98.7 F (37.1 C)  SpO2: 95% 95%    Exam:  EOMI NCAT Alert oriented looking younger than stated age -milrinone GTT running Chest is clinically clear no added sound S1-S2 seems to be in sinus Abdomen soft nontender  I have personally reviewed the following:   Labs:  Hemoglobin 9.8 platelet 179  Imaging studies:  None  Medical tests:  None  Test discussed with performing physician:  No  Decision to obtain old records:  No  Review and summation of old records:  No  Scheduled Meds: . amiodarone  200 mg Oral BID  . dronabinol  2.5 mg Oral BID AC  . famciclovir  250 mg Oral Daily  . feeding supplement (ENSURE ENLIVE)  237 mL Oral BID BM  . gabapentin  800 mg Oral QHS  . hydrocortisone  25 mg Rectal BID  . insulin aspart  0-15 Units Subcutaneous TID WC  . insulin aspart  0-5 Units Subcutaneous QHS  . magnesium oxide  400 mg Oral Daily  . Melatonin  6 mg Oral QHS  . pantoprazole  40 mg Oral Daily  . polyethylene glycol  17 g Oral Daily  . rosuvastatin  10 mg Oral Daily  . senna-docusate  2 tablet Oral BID  . sertraline  25 mg Oral Daily  . sodium chloride flush  3  mL Intravenous Q12H  . Tafamidis Meglumine (Cardiac)  4 capsule Oral Daily  . traZODone  50 mg Oral QHS   Continuous Infusions: . sodium chloride Stopped (11/20/17 1640)  . milrinone 0.125 mcg/kg/min (11/26/17 1200)    Principal Problem:   Atrial fibrillation with RVR (HCC) Active Problems:   CKD (chronic kidney disease) stage 3,  GFR 30-59 ml/min (HCC), baseline Cr 1.5   DM2 (diabetes mellitus, type 2) (HCC)   Acute on chronic combined systolic and diastolic CHF (congestive heart failure) (HCC)   Bilateral pleural effusion   Esophageal varices in cirrhosis (HCC)   SOB (shortness of breath)   Non-ischemic cardiomyopathy ([place])   Other secondary pulmonary hypertension (HCC)   RBBB   Cardiac amyloidosis (HCC)   Weakness generalized   Kappa light chain myeloma (HCC)   Leukocytosis   Elevated troponin   Elevated brain natriuretic peptide (BNP) level, acute on chronic   Hypotension   Palliative care by specialist   Cardiogenic shock ([place])   Rectal bleeding   Abdominal distension   Hematochezia   Acute blood loss anemia   LOS: 21 days

## 2017-11-26 NOTE — Progress Notes (Signed)
Patient ID: Dillon Andrews, male   DOB: [birth year], 77 y.o.   MRN: [id number]  This NP visited patient at the bedside as a follow up for emotional support and to re-meet with daughter/Sharon at her request.  Patient reports "feeling better today".  Currently he is being transfused.  Patient remains hopeful for improvement and eventual discharge home, he is open to all offered and available medical interventions to prolong life.  He plans to follow-up with Dr. Marin Olp in the near future.  Patient denies pain or discomfort currently.  Today patient's daughter was specifically asking for assistance with note arising a financial will, I explained that that is not an area that is handled by hospital personnel.  She understands and will take it to their family attorney.  Discussed again  with patient the importance of continued conversation with his family and his   medical providers regarding overall plan of care and treatment options,  ensuring decisions are within the context of the patients values and GOCs.  Questions and concerns addressed   Total time spent on the unit was 20 minutes  Greater than 50% of the time was spent in counseling and coordination of care  Wadie Lessen NP  Palliative Medicine Team Team Phone # [phone] Pager [phone]

## 2017-11-26 NOTE — Procedures (Signed)
Interventional Radiology Procedure Note  Procedure: Placement of a right IJ approach single lumen cuffed tunneled line.  Tip is positioned at the superior cavoatrial junction and catheter is ready for immediate use.  Complications: None Recommendations:  - Ok to shower tomorrow - Do not submerge  - Routine line care   Signed,  Dulcy Fanny. Earleen Newport, DO

## 2017-11-26 NOTE — Progress Notes (Addendum)
Patient ID: Dillon Andrews, male   DOB: Aug 21, 1940, 77 y.o.   MRN: [id number]     Advanced Heart Failure Rounding Note  PCP-Cardiologist: Glenetta Hew, MD   Subjective:    Remains on milrinone. PICC line deferred with GI bleeding and possible procedure. Weight stable.   Hgb down to 8.8 in setting of melena.   Given high-risk situation GI has deferred endoscopy at this point. Recent colonoscopy in 7/17 grade II hemorrhoids and sigmoid diverticuli.   Walked with PT. Recommending HHPT.  Feeling OK this am. Had BM last night with small amount of blood on toilet paper, but none in bowl. Denies SOB. No CP.   Hgb 10.1 -> 9.4 -> 8.8 -> 8.3.   Objective:   Weight Range: 78.5 kg Body mass index is 25.56 kg/m.   Vital Signs:   Temp:  [97.8 F (36.6 C)-99 F (37.2 C)] 98.6 F (37 C) (08/14 0719) Pulse Rate:  [55-66] 65 (08/14 0719) Resp:  [14-31] 24 (08/14 0719) BP: (90-125)/(45-102) 96/57 (08/14 0719) SpO2:  [94 %-99 %] 96 % (08/14 0719) Weight:  [78.5 kg] 78.5 kg (08/14 0500) Last BM Date: 11/24/17  Weight change: Filed Weights   11/24/17 0558 11/25/17 0500 11/26/17 0500  Weight: 74.8 kg 79 kg 78.5 kg    Intake/Output:   Intake/Output Summary (Last 24 hours) at 11/26/2017 0756 Last data filed at 11/26/2017 0700 Gross per 24 hour  Intake 1137.33 ml  Output 450 ml  Net 687.33 ml      Physical Exam   General: Elderly appearing. NAD HEENT: Normal anicteric  Neck: Supple. JVP 6-7 cm. Carotids 2+ bilat; no bruits. No thyromegaly or nodule noted. Cor: PMI lateral. RRR, No M/G/R noted Lungs: CTAB, normal effort. No wheeze Abdomen: Soft, non-tender, non-distended, no HSM. No bruits or masses. +BS  Extremities: No cyanosis, clubbing, or rash. R and LLE no edema.  Neuro: Alert & orientedx3, cranial nerves grossly intact. moves all 4 extremities w/o difficulty. Affect pleasant    Telemetry   NSR 60s, personally reviewed.   EKG    No new tracings.    Labs     CBC Recent Labs    11/25/17 0332 11/25/17 1433 11/26/17 0210  WBC 4.8 5.7 5.3  NEUTROABS 3.2  --   --   HGB 8.8* 9.0* 8.3*  HCT 28.5* 29.5* 26.8*  MCV 90.8 91.0 90.2  PLT 142* 173 627   Basic Metabolic Panel Recent Labs    11/25/17 0332 11/26/17 0210  NA 139 138  K 3.6 3.7  CL 96* 96*  CO2 35* 34*  GLUCOSE 148* 122*  BUN 17 17  CREATININE 1.34* 1.36*  CALCIUM 8.3* 8.5*   Liver Function Tests Recent Labs    11/26/17 0210  AST 30  ALT 26  ALKPHOS 160*  BILITOT 0.8  PROT 5.4*  ALBUMIN 2.6*   No results for input(s): LIPASE, AMYLASE in the last 72 hours. Cardiac Enzymes No results for input(s): CKTOTAL, CKMB, CKMBINDEX, TROPONINI in the last 72 hours.  BNP: BNP (last 3 results) Recent Labs    06/25/17 1207 09/15/17 1401 11/05/17 1310  BNP 420.9* 393.0* 515.3*    ProBNP (last 3 results) No results for input(s): PROBNP in the last 8760 hours.   D-Dimer No results for input(s): DDIMER in the last 72 hours. Hemoglobin A1C No results for input(s): HGBA1C in the last 72 hours. Fasting Lipid Panel No results for input(s): CHOL, HDL, LDLCALC, TRIG, CHOLHDL, LDLDIRECT in the last 72 hours.  Thyroid Function Tests No results for input(s): TSH, T4TOTAL, T3FREE, THYROIDAB in the last 72 hours.  Invalid input(s): FREET3  Other results:  Imaging   No results found.  Medications:     Scheduled Medications: . amiodarone  200 mg Oral BID  . docusate sodium  100 mg Oral BID  . dronabinol  2.5 mg Oral BID AC  . famciclovir  250 mg Oral Daily  . feeding supplement (ENSURE ENLIVE)  237 mL Oral BID BM  . gabapentin  800 mg Oral QHS  . hydrocortisone   Rectal TID  . hydrocortisone  25 mg Rectal BID  . insulin aspart  0-15 Units Subcutaneous TID WC  . insulin aspart  0-5 Units Subcutaneous QHS  . magnesium oxide  400 mg Oral Daily  . Melatonin  6 mg Oral QHS  . pantoprazole  40 mg Oral Daily  . polyethylene glycol  17 g Oral Daily  . rosuvastatin  10 mg  Oral Daily  . senna-docusate  2 tablet Oral BID  . sertraline  25 mg Oral Daily  . sodium chloride flush  3 mL Intravenous Q12H  . Tafamidis Meglumine (Cardiac)  4 capsule Oral Daily  . traZODone  50 mg Oral QHS    Infusions: . sodium chloride Stopped (11/20/17 1640)  . milrinone 0.125 mcg/kg/min (11/25/17 2300)    PRN Medications: sodium chloride, acetaminophen, bisacodyl, LORazepam, morphine injection, ondansetron (ZOFRAN) IV, simethicone, sodium chloride flush, zolpidem  Patient Profile   Dillon Andrews is a 77 y.o. male with a hx of chronic combined CHF due to NICM, CKD III,presumedNASH(per pt, never proven),esophageal varices per chart(prior glass of wine occasionally but no heavy alcohol),HTN, HLD, DM, Barrett's esophagus, GERD, anemia, RBBB, TTR amyloidosis, and Kappa Light Chain myeloma.  Ad[facility] 11/05/17 in setting of new Afib with RVR.   Assessment/Plan   1. Acute on chronic combined HF with TTR amyloidosis (Wild Type) - NICM by cath at [facility] 09/2016 with EF 40-45%. Echo 06/2017: EF 25-30%, mild LAE, mild/mod RV dilation, mod/severe RA. - PYP scan strongly suggestive of TTR amyloid, SPEP with 0.8% m-spike, UPEP with Bence Jones Protein positive, kappa type. 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 - Dr Marin Olp saw and thinks he does not have AL amyloid (just multiple myeloma) and that he has TTR amyloid and should be treated with tafamidis before chemo. Tafamidis started on 8/2.   Off milrinone 8/3. Milrinone restarted 8/7. Off Norep 8/6. - Torsemide held with bleeding. Cr 1.36 today.   - Continue milrinone 0.125 mcg. Have decided on home milrinone and [facility] has been contacted.  He will need tunneled catheter placed prior to home. Have deferred with bleeding.  - Continue to hold spiro.  - No ARB/entresto with for now with CKD - SBP soft 90-100s. Hold off on hydralazine/imdur  2. Sepsis - Afebrile. WBC 5.3.   - Has completed Vanc and Cefepine. - 8/5  Blood culture- NGTD - PICC removed. Repeat blood cultures done 8/9. NGTD - Will need tunneled line for milrinone. Will discuss timing with MD.   3. New onset paroxysmal AFlutter  - likely triggered HF decompensation  - s/p TEE DCCV 7/29.   - Back in atrial flutter 7/31, repeat DCCV on 8/2. Converted back to aflutter 8/5. - Maintaining NSR - Continue amio 200 mg BID - Holding eliquis with rectal bleed.    4. AKI on CKD III, baseline ~1.6 - Creatinine down to 1.36.  - Torsemide on hold with bleeding.  5. Recurrent bilateral effusions - Somewhat increased from March on CXR 11/05/17. No change.   6. [place] with Dr. Marin Olp who has seen this admission.  - Started on Bortezomib7/18/18andRevlimid7/23/19. - Continue management per primary team. - Dr Marin Olp thinks he does not have AL amyloid and that he does have TTR amyloid and should be treated with tafamodis before chemo. Now on Tafamidis. No change.    7. NASH with esophageal varices.  - Watch closely for bleeding with AC. Hemoglobin down to 8.3. GI following.  - Last EGD 06/21/2014 at [facility] for surveillance of Esophageal varices, Barrett's esophagus, and PMHx of Colon polyps. Showed two columns of small esophageal varices w/out stigmata, short segment Barrett's esophagus (without biopsies, due to presence of EVs), 5cm hiatal hernia, and hyperplastic small gastric nodules.   8. DM2 - Per primary. No change.   9. Hypokalemia. - K 3.7 this am.   11. Rectal bleeding -> melena - History of hemorrhoids, more bleeding than typical for him this morning but with large BM.  Hgb 10.1 -> 9.4 -> 8.8 -> 8.3 - Holding eliquis and torsemide. GI recommends holding Eliquis for up to 4 more days if possible.  - ?EGD/Colon deferred with recent (2017) showing diverticulosis and hemorrhoids.  - Given Right heart failure, pt would be at least high risk of perioperative complications from a cardiac standpoint.  Will discuss further with MD.   12. DNR/DNI  Length of Stay: [place], [place]  7:56 AM  Advanced Heart Failure Team Pager [phone] (M-F; 7a - 4p)  Please contact [facility] for night-coverage after hours (4p -7a ) and weekends on [email or web address]  Patient seen and examined with the above-signed Advanced Practice Provider and/or Housestaff. I personally reviewed laboratory data, imaging studies and relevant notes. I independently examined the patient and formulated the important aspects of the plan. I have edited the note to reflect any of my changes or salient points. I have personally discussed the plan with the patient and/or family.  Case discussed with GI. Previous colonoscopy reviewed. Will defer endoscopy at this point. Bleeding slowing. Continue to hold [facility] one more day. Give 1u RBCs. Place PICC line for home milrinone today. Volume status ok. Continue to hold diuretics one more day. Tolerating tafamadis. Creatinine stable.   Glori Bickers, MD  9:23 AM

## 2017-11-26 NOTE — Progress Notes (Addendum)
Physical Therapy Treatment Patient Details Name: Dillon Andrews MRN: [id number] DOB: Mar 08, 1941 Today's Date: 11/26/2017    History of Present Illness Pt is a 77 y/o male admitted 11/05/17 secondary to worsening SOB and chest tightness; found to have a fib with RVR. S/p TEE and cardioversion 7/29. 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 PMH inlcudes kappa light chain myeloma (currently undergoing chemotherapy), CHF, nonischemic cardiomyopathy, pulmonary HTN, CKD.     PT Comments    Pt pleasant and motivated for therapy, with mention "feeling much better today." Pt ambulated 200 ft with supervision throughout majority of gait, with "weak" feeling in legs. Pt still requires assist with turns for balance. Pt encouraged to continue HEP to continue to build strength. Pt desires to return home, balance is still a bit unsteady, but pt has great caregiver support at home.     Follow Up Recommendations  Home health PT;Supervision for mobility/OOB     Equipment Recommendations  None recommended by PT    Recommendations for Other Services       Precautions / Restrictions Precautions Precautions: Fall Restrictions Weight Bearing Restrictions: No    Mobility  Bed Mobility Overal bed mobility: Modified Independent Bed Mobility: Supine to Sit     Supine to sit: Modified independent (Device/Increase time);HOB elevated        Transfers Overall transfer level: Needs assistance Equipment used: Rolling walker (2 wheeled) Transfers: Sit to/from Stand Sit to Stand: Supervision         General transfer comment: supervision for safety and lines  Ambulation/Gait Ambulation/Gait assistance: Min assist Gait Distance (Feet): 200 Feet Assistive device: Rolling walker (2 wheeled) Gait Pattern/deviations: Step-through pattern;Decreased stride length;Trunk flexed Gait velocity: decreased  Gait velocity interpretation: 1.31 - 2.62 ft/sec, indicative of  limited community ambulator General Gait Details: supervision through most gait with min a during turn to L, pt staggered R during turn with physical assist to correct    Stairs             Wheelchair Mobility    Modified Rankin (Stroke Patients Only)       Balance Overall balance assessment: Needs assistance Sitting-balance support: No upper extremity supported;Feet supported Sitting balance-Leahy Scale: Good     Standing balance support: No upper extremity supported Standing balance-Leahy Scale: Fair                              Cognition Arousal/Alertness: Awake/alert Behavior During Therapy: WFL for tasks assessed/performed Overall Cognitive Status: Within Functional Limits for tasks assessed                                        Exercises      General Comments General comments (skin integrity, edema, etc.): pt's son present       Pertinent Vitals/Pain Pain Assessment: No/denies pain    Home Living                      Prior Function            PT Goals (current goals can now be found in the care plan section) Progress towards PT goals: Progressing toward goals    Frequency    Min 3X/week      PT Plan Current plan remains appropriate    Co-evaluation  AM-PAC PT "6 Clicks" Daily Activity  Outcome Measure  Difficulty turning over in bed (including adjusting bedclothes, sheets and blankets)?: None Difficulty moving from lying on back to sitting on the side of the bed? : A Little Difficulty sitting down on and standing up from a chair with arms (e.g., wheelchair, bedside commode, etc,.)?: A Little Help needed moving to and from a bed to chair (including a wheelchair)?: A Little Help needed walking in hospital room?: A Little Help needed climbing 3-5 steps with a railing? : A Little 6 Click Score: 19    End of Session Equipment Utilized During Treatment: Gait belt Activity Tolerance:  Patient tolerated treatment well;Patient limited by fatigue Patient left: in chair;with call bell/phone within reach;with family/visitor present Nurse Communication: Mobility status PT Visit Diagnosis: Unsteadiness on feet (R26.81);Muscle weakness (generalized) (M62.81);Other abnormalities of gait and mobility (R26.89)     Time: 2956-2130 PT Time Calculation (min) (ACUTE ONLY): 21 min  Charges:  $Gait Training: 8-22 mins                     Samuella Bruin, [place]  Acute Rehab [phone]    Samuella Bruin 11/26/2017, 11:01 AM

## 2017-11-26 NOTE — Progress Notes (Addendum)
Daily Rounding Note  11/26/2017, 11:11 AM  LOS: 21 days   SUBJECTIVE:   Chief complaint: Bowel movement yesterday evening with a scant amount of blood on the tissue after wiping.  Not see blood in the commode water.    Dr [name] ordered 1 U PRBC, transfusing now Pt feels well, walking in hall with PT.  Appetite good.    OBJECTIVE:         Vital signs in last 24 hours:    Temp:  [97.8 F (36.6 C)-99 F (37.2 C)] 98.1 F (36.7 C) (08/14 1050) Pulse Rate:  [55-77] 67 (08/14 1050) Resp:  [14-31] 18 (08/14 1050) BP: (90-116)/(45-63) 103/56 (08/14 1050) SpO2:  [94 %-99 %] 96 % (08/14 1050) Weight:  [78.5 kg] 78.5 kg (08/14 0500) Last BM Date: 11/25/17 Filed Weights   11/24/17 0558 11/25/17 0500 11/26/17 0500  Weight: 74.8 kg 79 kg 78.5 kg   General: Awake, sitting in bed.  Looks comfortable.  Does not look ill. Heart: RRR. Chest: Clear bilaterally.  No labored breathing. Abdomen: Active bowel sounds.  Soft.  Not distended or tender. Extremities: No CCE. Neuro/Psych: Alert and oriented x3.  No gross weakness.  Moves all 4 limbs.  No tremors.  Normal affect.  Intake/Output from previous day: 08/13 0701 - 08/14 0700 In: 1137.3 [P.O.:1074; I.V.:63.3] Out: 450 [Urine:450]  Intake/Output this shift: Total I/O In: 26.7 [I.V.:26.7] Out: 200 [Urine:200]  Lab Results: Recent Labs    11/25/17 0332 11/25/17 1433 11/26/17 0210  WBC 4.8 5.7 5.3  HGB 8.8* 9.0* 8.3*  HCT 28.5* 29.5* 26.8*  PLT 142* 173 151   BMET Recent Labs    11/24/17 0158 11/25/17 0332 11/26/17 0210  NA 141 139 138  K 3.5 3.6 3.7  CL 96* 96* 96*  CO2 36* 35* 34*  GLUCOSE 126* 148* 122*  BUN 24* 17 17  CREATININE 1.53* 1.34* 1.36*  CALCIUM 8.3* 8.3* 8.5*   LFT Recent Labs    11/26/17 0210  PROT 5.4*  ALBUMIN 2.6*  AST 30  ALT 26  ALKPHOS 160*  BILITOT 0.8   PT/INR No results for input(s): LABPROT, INR in the last 72  hours. Hepatitis Panel No results for input(s): HEPBSAG, HCVAB, HEPAIGM, HEPBIGM in the last 72 hours.  Studies/Results: No results found.  Scheduled Meds: . amiodarone  200 mg Oral BID  . docusate sodium  100 mg Oral BID  . dronabinol  2.5 mg Oral BID AC  . famciclovir  250 mg Oral Daily  . feeding supplement (ENSURE ENLIVE)  237 mL Oral BID BM  . gabapentin  800 mg Oral QHS  . hydrocortisone   Rectal TID  . hydrocortisone  25 mg Rectal BID  . insulin aspart  0-15 Units Subcutaneous TID WC  . insulin aspart  0-5 Units Subcutaneous QHS  . magnesium oxide  400 mg Oral Daily  . Melatonin  6 mg Oral QHS  . pantoprazole  40 mg Oral Daily  . polyethylene glycol  17 g Oral Daily  . rosuvastatin  10 mg Oral Daily  . senna-docusate  2 tablet Oral BID  . sertraline  25 mg Oral Daily  . sodium chloride flush  3 mL Intravenous Q12H  . Tafamidis Meglumine (Cardiac)  4 capsule Oral Daily  . traZODone  50 mg Oral QHS   Continuous Infusions: . sodium chloride Stopped (11/20/17 1640)  . milrinone 0.125 mcg/kg/min (11/26/17 0800)   PRN Meds:.sodium chloride,  acetaminophen, bisacodyl, LORazepam, morphine injection, ondansetron (ZOFRAN) IV, simethicone, sodium chloride flush, zolpidem  ASSESMENT:   *    Painless hematochezia.  Colon polyp, lipoma, internal hemorrhoids, diverticulosis seen on 2017 colonoscopy.  Anusol suppositories twice daily initiated 8/13.  *    Acute on chronic anemia.  Receiving his first PRBC currently.  Just 1 unit ordered.  *    History [place], suspect cirrhosis.  *    History esophageal varices.  Seen several years ago.  No varices, no portal hypertensive gastropathy seen on EGDs in 2017.  *    CHF.  EF 10% on recent TEE.  A. fib with RVR, sinus rhythm following DCCV.  Multiple myeloma.  Amyloidosis. Chronic milrinone initiated.  *    AKI, CKD.   PLAN   *   If no significant bleeding occurs in the interim, can restart Eliquis ~ 8/18.  Patient tells me that  cardiologist will probably be using a low dose of Eliquis.   Azucena Freed  11/26/2017, 11:11 AM Phone [phone]     Attending physician's note   I have taken an interval history, reviewed the chart and examined the patient. I agree with the Advanced Practitioner's note, impression and recommendations.   Resolving hemorrhoidal vs diverticular bleed.  ABL anemia receiving transfusion today.   Anusol HC supp bid for 5 days then prn. Continue Senokot-S bid long term.  Miralax daily prn.  OK to resume Eliquis as clinically indicated ~ 8/18 - defer decision to cardiology.  GI signing off.  Outpatient GI follow up with Dr. Mitchell Heir in [facility].   Lucio Edward, MD FACG [phone] office

## 2017-11-27 ENCOUNTER — Encounter (HOSPITAL_COMMUNITY): Payer: Self-pay | Admitting: Interventional Radiology

## 2017-11-27 ENCOUNTER — Inpatient Hospital Stay: Payer: Medicare HMO | Admitting: Hematology & Oncology

## 2017-11-27 ENCOUNTER — Inpatient Hospital Stay: Payer: Medicare HMO

## 2017-11-27 DIAGNOSIS — Z515 Encounter for palliative care: Secondary | ICD-10-CM

## 2017-11-27 DIAGNOSIS — R531 Weakness: Secondary | ICD-10-CM

## 2017-11-27 LAB — BASIC METABOLIC PANEL
ANION GAP: 11 (ref 5–15)
BUN: 15 mg/dL (ref 8–23)
CHLORIDE: 96 mmol/L — AB (ref 98–111)
CO2: 33 mmol/L — ABNORMAL HIGH (ref 22–32)
Calcium: 8.4 mg/dL — ABNORMAL LOW (ref 8.9–10.3)
Creatinine, Ser: 1.32 mg/dL — ABNORMAL HIGH (ref 0.61–1.24)
GFR calc Af Amer: 58 mL/min — ABNORMAL LOW (ref >60)
GFR calc non Af Amer: 50 mL/min — ABNORMAL LOW (ref >60)
GLUCOSE: 119 mg/dL — AB (ref 70–99)
POTASSIUM: 3.7 mmol/L (ref 3.5–5.1)
Sodium: 140 mmol/L (ref 135–145)

## 2017-11-27 LAB — CBC
HCT: 29.7 % — ABNORMAL LOW (ref 39.0–52.0)
Hemoglobin: 9.1 g/dL — ABNORMAL LOW (ref 13.0–17.0)
MCH: 28.3 pg (ref 26.0–34.0)
MCHC: 30.6 g/dL (ref 30.0–36.0)
MCV: 92.2 fL (ref 78.0–100.0)
PLATELETS: 178 10*3/uL (ref 150–400)
RBC: 3.22 MIL/uL — ABNORMAL LOW (ref 4.22–5.81)
RDW: 18.2 % — AB (ref 11.5–15.5)
WBC: 5.6 10*3/uL (ref 4.0–10.5)

## 2017-11-27 LAB — BPAM RBC
Blood Product Expiration Date: 201909042359
ISSUE DATE / TIME: 201908141019
Unit Type and Rh: 6200

## 2017-11-27 LAB — GLUCOSE, CAPILLARY
Glucose-Capillary: 122 mg/dL — ABNORMAL HIGH (ref 70–99)
Glucose-Capillary: 175 mg/dL — ABNORMAL HIGH (ref 70–99)

## 2017-11-27 LAB — TYPE AND SCREEN
ABO/RH(D): A POS
Antibody Screen: NEGATIVE
UNIT DIVISION: 0

## 2017-11-27 LAB — COOXEMETRY PANEL
Carboxyhemoglobin: 1.9 % — ABNORMAL HIGH (ref 0.5–1.5)
Methemoglobin: 1 % (ref 0.0–1.5)
O2 Saturation: 70.2 %
TOTAL HEMOGLOBIN: 9.4 g/dL — AB (ref 12.0–16.0)

## 2017-11-27 MED ORDER — HYDROCORTISONE ACETATE 25 MG RE SUPP: 25.0000 mg | Freq: Two times a day (BID) | RECTAL | 0 refills | Status: DC

## 2017-11-27 MED ORDER — VYNDAQEL 20 MG PO CAPS: 80.0000 mg | ORAL_CAPSULE | Freq: Every day | ORAL | Status: AC

## 2017-11-27 MED ORDER — AMIODARONE HCL 200 MG PO TABS: 200.0000 mg | ORAL_TABLET | Freq: Two times a day (BID) | ORAL | 6 refills | Status: DC

## 2017-11-27 MED ORDER — SENNOSIDES-DOCUSATE SODIUM 8.6-50 MG PO TABS
2.0000 | ORAL_TABLET | Freq: Two times a day (BID) | ORAL | 0 refills | Status: DC
Start: 2017-11-27 — End: 2018-05-20

## 2017-11-27 MED ORDER — TORSEMIDE 20 MG PO TABS: 20.0000 mg | ORAL_TABLET | Freq: Every day | ORAL | 3 refills | Status: AC

## 2017-11-27 MED ORDER — SERTRALINE HCL 25 MG PO TABS: 25.0000 mg | ORAL_TABLET | Freq: Every day | ORAL | 0 refills | Status: DC

## 2017-11-27 MED ORDER — POLYETHYLENE GLYCOL 3350 17 G PO PACK: 17.0000 g | PACK | Freq: Every day | ORAL | 0 refills | Status: AC

## 2017-11-27 MED ORDER — APIXABAN 2.5 MG PO TABS: 2.5000 mg | ORAL_TABLET | Freq: Two times a day (BID) | ORAL | Status: DC

## 2017-11-27 MED ORDER — MILRINONE LACTATE IN DEXTROSE 20-5 MG/100ML-% IV SOLN: 0.1250 ug/kg/min | INTRAVENOUS | 0 refills | Status: AC

## 2017-11-27 MED ORDER — POTASSIUM CHLORIDE CRYS ER 20 MEQ PO TBCR: 20.0000 meq | EXTENDED_RELEASE_TABLET | Freq: Every day | ORAL | 3 refills | Status: DC

## 2017-11-27 MED ORDER — APIXABAN 2.5 MG PO TABS: 2.5000 mg | ORAL_TABLET | Freq: Two times a day (BID) | ORAL | 0 refills | Status: DC

## 2017-11-27 MED ORDER — GLIMEPIRIDE 1 MG PO TABS: 1.0000 mg | ORAL_TABLET | ORAL | 0 refills | Status: DC

## 2017-11-27 NOTE — Progress Notes (Addendum)
Patient ID: Dillon Andrews, male   DOB: 1940-07-24, 77 y.o.   MRN: [id number]     Advanced Heart Failure Rounding Note  PCP-Cardiologist: Glenetta Hew, MD   Subjective:    Remains on milrinone. Tunneled PICC line place 11/26/17. Weight up 1.5 lbs.   Feeling good this am. Just walked 300 feet and up the stairs with PT. Denies lightheadedness or dizziness. He is requiring O2 during the night (2L via [place]) to keep sats > 90.  No further bleeding. No orthopnea or PND.   Hgb 10.1 -> 9.4 -> 8.8 -> 8.3 -> 9.1 s/p 1 uPRBCs.  Objective:   Weight Range: 79.2 kg Body mass index is 25.78 kg/m.   Vital Signs:   Temp:  [98.1 F (36.7 C)-99.1 F (37.3 C)] 98.8 F (37.1 C) (08/15 0257) Pulse Rate:  [59-77] 68 (08/15 0257) Resp:  [10-26] 17 (08/15 0257) BP: (103-116)/(52-65) 108/62 (08/15 0257) SpO2:  [95 %-98 %] 98 % (08/15 0257) Weight:  [79.2 kg] 79.2 kg (08/15 0547) Last BM Date: 11/25/17  Weight change: Filed Weights   11/25/17 0500 11/26/17 0500 11/27/17 0547  Weight: 79 kg 78.5 kg 79.2 kg   Intake/Output:   Intake/Output Summary (Last 24 hours) at 11/27/2017 0741 Last data filed at 11/27/2017 0548 Gross per 24 hour  Intake 535.14 ml  Output 575 ml  Net -39.86 ml     Physical Exam   General: Elderly appearing. NAD.  HEENT: Normal anicteric Neck: Supple. JVP 7-8 cm. Carotids 2+ bilat; no bruits. No thyromegaly or nodule noted. Tunneled picc ok  Cor: PMI nondisplaced. RRR, No M/G/R noted Lungs: CTAB, normal effort. No wheeze Abdomen: Soft, non-tender, non-distended, no HSM. No bruits or masses. +BS  Extremities: No cyanosis, clubbing, or rash. R and LLE no edema.  Neuro: Alert & orientedx3, cranial nerves grossly intact. moves all 4 extremities w/o difficulty. Affect pleasant    Telemetry   NSR 60s, personally reviewed.   EKG    No new tracings.    Labs    CBC Recent Labs    11/25/17 0332  11/26/17 1429 11/27/17 0300  WBC 4.8   < > 5.6 5.6  NEUTROABS 3.2   --   --   --   HGB 8.8*   < > 9.8* 9.1*  HCT 28.5*   < > 31.4* 29.7*  MCV 90.8   < > 91.5 92.2  PLT 142*   < > 179 178   < > = values in this interval not displayed.   Basic Metabolic Panel Recent Labs    11/26/17 0210 11/27/17 0300  NA 138 140  K 3.7 3.7  CL 96* 96*  CO2 34* 33*  GLUCOSE 122* 119*  BUN 17 15  CREATININE 1.36* 1.32*  CALCIUM 8.5* 8.4*   Liver Function Tests Recent Labs    11/26/17 0210  AST 30  ALT 26  ALKPHOS 160*  BILITOT 0.8  PROT 5.4*  ALBUMIN 2.6*   No results for input(s): LIPASE, AMYLASE in the last 72 hours. Cardiac Enzymes No results for input(s): CKTOTAL, CKMB, CKMBINDEX, TROPONINI in the last 72 hours.  BNP: BNP (last 3 results) Recent Labs    06/25/17 1207 09/15/17 1401 11/05/17 1310  BNP 420.9* 393.0* 515.3*    ProBNP (last 3 results) No results for input(s): PROBNP in the last 8760 hours.   D-Dimer No results for input(s): DDIMER in the last 72 hours. Hemoglobin A1C No results for input(s): HGBA1C in the last 72 hours.  Fasting Lipid Panel No results for input(s): CHOL, HDL, LDLCALC, TRIG, CHOLHDL, LDLDIRECT in the last 72 hours. Thyroid Function Tests No results for input(s): TSH, T4TOTAL, T3FREE, THYROIDAB in the last 72 hours.  Invalid input(s): FREET3  Other results:  Imaging   Ir Fluoro Guide Cv Line Right  Result Date: 11/27/2017 INDICATION: 77 year old male with a history of heart failure EXAM: IMAGE GUIDED TUNNELED CENTRAL CATHETER MEDICATIONS: None ANESTHESIA/SEDATION: None FLUOROSCOPY TIME:  Fluoroscopy Time: 0 minutes 6 seconds (0 mGy). COMPLICATIONS: None PROCEDURE: After written informed consent was obtained, patient was placed in the supine position on angiographic table. Patency of the right internal jugular vein was confirmed with ultrasound with image documentation. Patient was prepped and draped in the usual sterile fashion including the right neck and right superior chest. Using ultrasound guidance,  the skin and subcutaneous tissues overlying the right internal jugular vein were generously infiltrated with 1% lidocaine without epinephrine. Using ultrasound guidance, the right internal jugular vein was punctured with a micropuncture needle, and an 018 wire was advanced into the right heart confirming venous access. A small stab incision was made with an 11 blade scalpel. Peel-away sheath was placed over the wire, and then the wire was removed, marking the wire for estimation of internal catheter length. The chest wall was then generously infiltrated with 1% lidocaine for local anesthesia along the tissue tract. Small stab incision was made with 11 blade scalpel, and then the catheter was back tunneled to the puncture site at the right internal jugular vein. Catheter was pulled through the tract, with the catheter amputated at 20 cm. Catheter was advanced through the peel-away sheath, and the peel-away sheath was removed. Final image was stored. The catheter was anchored to the chest wall with 2 retention sutures, and Derma bond was used to seal the right internal jugular vein incision site and at the right chest wall. Patient tolerated the procedure well and remained hemodynamically stable throughout. No complications were encountered and no significant blood loss was encountered. IMPRESSION: Status post tunneled central catheter. Signed, Dulcy Fanny. Dellia Nims, RPVI [facility] Radiology Electronically Signed   By: Corrie Mckusick D.O.   On: 11/27/2017 07:24   Ir US Guide Vasc Access Right  Result Date: 11/27/2017 INDICATION: 77 year old male with a history of heart failure EXAM: IMAGE GUIDED TUNNELED CENTRAL CATHETER MEDICATIONS: None ANESTHESIA/SEDATION: None FLUOROSCOPY TIME:  Fluoroscopy Time: 0 minutes 6 seconds (0 mGy). COMPLICATIONS: None PROCEDURE: After written informed consent was obtained, patient was placed in the supine position on angiographic table.  Patency of the right internal jugular vein was confirmed with ultrasound with image documentation. Patient was prepped and draped in the usual sterile fashion including the right neck and right superior chest. Using ultrasound guidance, the skin and subcutaneous tissues overlying the right internal jugular vein were generously infiltrated with 1% lidocaine without epinephrine. Using ultrasound guidance, the right internal jugular vein was punctured with a micropuncture needle, and an 018 wire was advanced into the right heart confirming venous access. A small stab incision was made with an 11 blade scalpel. Peel-away sheath was placed over the wire, and then the wire was removed, marking the wire for estimation of internal catheter length. The chest wall was then generously infiltrated with 1% lidocaine for local anesthesia along the tissue tract. Small stab incision was made with 11 blade scalpel, and then the catheter was back tunneled to the puncture site at the right internal jugular vein. Catheter was pulled  through the tract, with the catheter amputated at 20 cm. Catheter was advanced through the peel-away sheath, and the peel-away sheath was removed. Final image was stored. The catheter was anchored to the chest wall with 2 retention sutures, and Derma bond was used to seal the right internal jugular vein incision site and at the right chest wall. Patient tolerated the procedure well and remained hemodynamically stable throughout. No complications were encountered and no significant blood loss was encountered. IMPRESSION: Status post tunneled central catheter. Signed, Dulcy Fanny. Dellia Nims, RPVI [facility] Radiology Electronically Signed   By: Corrie Mckusick D.O.   On: 11/27/2017 07:24    Medications:     Scheduled Medications: . amiodarone  200 mg Oral BID  . dronabinol  2.5 mg Oral BID AC  . famciclovir  250 mg Oral Daily  . feeding supplement (ENSURE  ENLIVE)  237 mL Oral BID BM  . gabapentin  800 mg Oral QHS  . hydrocortisone  25 mg Rectal BID  . insulin aspart  0-15 Units Subcutaneous TID WC  . insulin aspart  0-5 Units Subcutaneous QHS  . magnesium oxide  400 mg Oral Daily  . Melatonin  6 mg Oral QHS  . pantoprazole  40 mg Oral Daily  . polyethylene glycol  17 g Oral Daily  . rosuvastatin  10 mg Oral Daily  . senna-docusate  2 tablet Oral BID  . sertraline  25 mg Oral Daily  . sodium chloride flush  3 mL Intravenous Q12H  . Tafamidis Meglumine (Cardiac)  4 capsule Oral Daily  . traZODone  50 mg Oral QHS    Infusions: . sodium chloride Stopped (11/20/17 1640)  . milrinone 0.125 mcg/kg/min (11/27/17 0307)    PRN Medications: sodium chloride, acetaminophen, lidocaine (PF), LORazepam, morphine injection, ondansetron (ZOFRAN) IV, simethicone, sodium chloride flush, zolpidem  Patient Profile   RICAHRD SCHWAGER is a 77 y.o. male with a hx of chronic combined CHF due to NICM, CKD III,presumedNASH(per pt, never proven),esophageal varices per chart(prior glass of wine occasionally but no heavy alcohol),HTN, HLD, DM, Barrett's esophagus, GERD, anemia, RBBB, TTR amyloidosis, and Kappa Light Chain myeloma.  Admitted to [facility] 11/05/17 in setting of new Afib with RVR.   Assessment/Plan   1. 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 Echo 06/2017: EF 25-30%, mild LAE, mild/mod RV dilation, mod/severe RA. - PYP scan strongly suggestive of TTR amyloid, SPEP with 0.8% m-spike, UPEP with Bence Jones Protein positive, kappa type. 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 - Dr Marin Olp saw and thinks he does not have AL amyloid (just multiple myeloma) and that he has TTR amyloid and should be treated with tafamidis before chemo. Tafamidis started on 8/2.   Off milrinone 8/3. Milrinone restarted 8/7. Off Norep 8/6. - Cr 1.32 today. Resume torsemide 20 mg BID for home.  -  Continue milrinone 0.125 mcg. Tunneled PICC place and AHC in place for home.  - Continue to hold spiro.  - No ARB/entresto with for now with CKD - SBP soft 90-100s. Hold off on hydralazine/imdur  2. Sepsis - Afebrile. WBC 5.6 - Has completed Vanc and Cefepine. - 8/5 Blood culture- NGTD - PICC initially removed. Repeat blood cultures done 8/9. NGTD - Will need tunneled line for milrinone. Will discuss timing with MD.   3. New onset paroxysmal AFlutter  - likely triggered HF decompensation  - s/p TEE  DCCV 7/29.   - Back in atrial flutter 7/31, repeat DCCV on 8/2. Converted back to aflutter 8/5. - Maintaining NSR. - Continue amio 200 mg BID - Holding eliquis with rectal bleed.    4. AKI on CKD III, baseline ~1.6 - Creatinine 1.32.  - Will need to resume torsemide for home.   5. Recurrent bilateral effusions - Somewhat increased from March on CXR 11/05/17. No change.   6. [place] with Dr. Marin Olp who has seen this admission.  - Started on Bortezomib7/18/18andRevlimid7/23/19. - Continue management per primary team. - Dr Marin Olp thinks he does not have AL amyloid and that he does have TTR amyloid and should be treated with tafamodis before chemo. Now on Tafamidis. No change.   7. NASH with esophageal varices.  - Watch closely for bleeding with AC. Hemoglobin up to 9.1 s/p 1 u PRBCs.   - Last EGD 06/21/2014 at [facility] for surveillance of Esophageal varices, Barrett's esophagus, and PMHx of Colon polyps. Showed two columns of small esophageal varices w/out stigmata, short segment Barrett's esophagus (without biopsies, due to presence of EVs), 5cm hiatal hernia, and hyperplastic small gastric nodules.   8. DM2 - Per primary. No change.   9. Hypokalemia. - K 3.7 this am.    11. Rectal bleeding -> melena - History of hemorrhoids, more bleeding than typical for him this morning but with large BM.  Hgb 10.1 -> 9.4 -> 8.8 -> 8.3 -> 9.1 with 1  uPRBCs - Holding eliquis and torsemide. Will need to resume torsemide for home and discuss timing of Eliquis resumption with MD.   - EGD/Colon deferred with recent (2017) showing diverticulosis and hemorrhoids.   12. DNR/DNI   Heart failure team has determined pt stable for discharge as of 11/27/17. He is set up for [facility] for his milrinone.   HF Medication Recommendations for Home: Torsemide 20 mg daily,(this is reduced from 20 bid)  with extra 20 mg as needed.  Amiodarone 200 mg BID Potassium 20 meq daily Tafimidis 80 mg daily (4 20 mg capsules) Eliquis 2.5 mg BID to restart on 11/29/17 Milrinone 0.125 mcg/kg/min (ordered through [facility])  Stop ASA   Follow up as an outpatient will be placed in his AVS.   Length of Stay: [place], [place]  7:41 AM  Advanced Heart Failure Team Pager [phone] (M-F; 7a - 4p)  Please contact [facility] for night-coverage after hours (4p -7a ) and weekends on [email or web address]  Patient seen and examined with the above-signed Advanced Practice Provider and/or Housestaff. I personally reviewed laboratory data, imaging studies and relevant notes. I independently examined the patient and formulated the important aspects of the plan. I have edited the note to reflect any of my changes or salient points. I have personally discussed the plan with the patient and/or family.  He is much improved. Ok to go home today with close f/u in HF Clinic. Have discussed meds with him and his family. Restart Eliquis on Saturday at lower dose. We will see him in HF Clinic next week.   Glori Bickers, MD  8:46 AM

## 2017-11-27 NOTE — Progress Notes (Signed)
Physical Therapy Treatment Patient Details Name: Dillon Andrews MRN: [id number] DOB: [birth year] Today's Date: 11/27/2017    History of Present Illness Pt is a 77 y/o male admitted 11/05/17 secondary to worsening SOB and chest tightness; found to have a fib with RVR. S/p TEE and cardioversion 7/29. 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 PMH inlcudes kappa light chain myeloma (currently undergoing chemotherapy), CHF, nonischemic cardiomyopathy, pulmonary HTN, CKD.     PT Comments    Pt pleasant on arrival, and eager for therapy and to go home. Pt able to transfer sit to stand and ambulate 300 ft with supervision, and climb 2 stairs with left rail min guard. Pt with increased balance and steady on feet today. Pt does not have rail at home, but is in the process of having one installed. Pt does have adequate assistance once home. Pt educated on HEP and encouraged to continue for increased strength.    Follow Up Recommendations  Home health PT;Supervision for mobility/OOB     Equipment Recommendations  None recommended by PT    Recommendations for Other Services       Precautions / Restrictions Precautions Precautions: Fall Restrictions Weight Bearing Restrictions: No    Mobility  Bed Mobility Overal bed mobility: Modified Independent Bed Mobility: Supine to Sit           General bed mobility comments: HOB elevated 30 degrees  Transfers Overall transfer level: Needs assistance     Sit to Stand: Supervision         General transfer comment: supervision for lines  Ambulation/Gait Ambulation/Gait assistance: Supervision Gait Distance (Feet): 300 Feet Assistive device: Rolling walker (2 wheeled) Gait Pattern/deviations: Step-through pattern;Decreased stride length;Trunk flexed   Gait velocity interpretation: >2.62 ft/sec, indicative of community ambulatory General Gait Details: pt with good stability with flexed knees and trunk  with use of RW   Stairs Stairs: Yes Stairs assistance: Min guard Stair Management: Step to pattern;One rail Left;Forwards Number of Stairs: 2 General stair comments: pt with good stability with stairs with use of rail and pt reports son has installed rail at home   Wheelchair Mobility    Modified Rankin (Stroke Patients Only)       Balance Overall balance assessment: Needs assistance   Sitting balance-Leahy Scale: Good     Standing balance support: No upper extremity supported Standing balance-Leahy Scale: Fair Standing balance comment: standing EOb without RW                            Cognition Arousal/Alertness: Awake/alert Behavior During Therapy: WFL for tasks assessed/performed Overall Cognitive Status: Within Functional Limits for tasks assessed                                        Exercises General Exercises - Lower Extremity Long Arc Quad: AROM;15 reps;Both;Strengthening Hip Flexion/Marching: Both;15 reps;AROM;Strengthening    General Comments        Pertinent Vitals/Pain Pain Assessment: No/denies pain    Home Living                      Prior Function            PT Goals (current goals can now be found in the care plan section) Progress towards PT goals: Progressing toward goals  Frequency           PT Plan Current plan remains appropriate    Co-evaluation              AM-PAC PT "6 Clicks" Daily Activity  Outcome Measure  Difficulty turning over in bed (including adjusting bedclothes, sheets and blankets)?: None Difficulty moving from lying on back to sitting on the side of the bed? : A Little Difficulty sitting down on and standing up from a chair with arms (e.g., wheelchair, bedside commode, etc,.)?: A Little Help needed moving to and from a bed to chair (including a wheelchair)?: A Little Help needed walking in hospital room?: A Little Help needed climbing 3-5 steps with a railing?  : A Little 6 Click Score: 19    End of Session Equipment Utilized During Treatment: Gait belt Activity Tolerance: Patient tolerated treatment well Patient left: in chair;with call bell/phone within reach;with family/visitor present Nurse Communication: Mobility status PT Visit Diagnosis: Unsteadiness on feet (R26.81);Muscle weakness (generalized) (M62.81);Other abnormalities of gait and mobility (R26.89)     Time: 2423-5361 PT Time Calculation (min) (ACUTE ONLY): 19 min  Charges:  $Gait Training: 8-22 mins                     Samuella Bruin, [place]  Acute Rehab [phone]    Samuella Bruin 11/27/2017, 9:29 AM

## 2017-11-27 NOTE — Consult Note (Signed)
   [facility] Inpatient Consult   11/27/2017  Payam W Deguire 1940/12/17 [id number]    Patient screened for potential [facility] Care Management services due to unplanned readmission risk score of 50% (extreme) and extended hospital length of stay.   Went to bedside to speak with Mr. Hamor and Lindajo Royal,  about [place] Management services prior to hospital discharge. Mr. Daft politely declines [place] Management follow up. [name] (daughter) states " we will call Dr. [name] if we have any concerns". Denies having any medication, transportation, or community case management needs at this time.  Provided [facility] Care Management brochure with contact information and 24-hr nurse advice line magnet to call should they change their minds in the future about [place] Management engagement.   Will make inpatient RNCM aware [place] Management services were declined.    Marthenia Rolling, MSN-Ed, RN,BSN [facility] Liaison [phone]

## 2017-11-27 NOTE — Discharge Summary (Addendum)
Physician Discharge Summary  Dillon Andrews VWP:[id number] DOB: [birth year] DOA: 11/05/2017  PCP: Dillon Maw, MD  Admit date: 11/05/2017 Discharge date: 11/27/2017  Time spent: 45 minutes  Recommendations for Outpatient Follow-up:  1. Needs weight check and labs at cardiology office as per their instructions 2. Would consider change in dose of amiodarone as per cardiology-recommend screening TSH Chem-12 1 month 3. Please schedule follow-up with patient's primary gastroenterologist 4. Recommend continuation of Anusol as needed 5. Note dosage change of Demadex 6. Eliquis to resume as per cardiology input on 8/17 --NO more aspirin for now 7. Patient needs outpatient oncology follow-up for myeloma-continue Tafamidis 80 mg daily and note other chemotherapy has been stopped 8. PICC line and milrinone has been set up by cardiology and advanced home care 9. Recommend discussion regarding risks and benefits of hypoglycemic agents with PCP-was taken off of oral agents in June by PCP but A1c this admission is 7.5-patient is aware to continue checking sugars  Discharge Diagnoses:  Principal Problem:   Atrial fibrillation with RVR ([place]) Active Problems:   CKD (chronic kidney disease) stage 3, GFR 30-59 ml/min (HCC), baseline Cr 1.5   DM2 (diabetes mellitus, type 2) (HCC)   Acute on chronic systolic (congestive) heart failure (HCC)   Bilateral pleural effusion   Esophageal varices in cirrhosis (HCC)   SOB (shortness of breath)   Non-ischemic cardiomyopathy ([place])   Other secondary pulmonary hypertension (HCC)   RBBB   Cardiac amyloidosis (HCC)   Weakness generalized   Kappa light chain myeloma (HCC)   Leukocytosis   Elevated troponin   Elevated brain natriuretic peptide (BNP) level, acute on chronic   Hypotension   Palliative care by specialist   Cardiogenic shock ([place])   Rectal bleeding   Abdominal distension   Hematochezia   Acute blood loss anemia   Discharge Condition:  imProved  Diet recommendation: Healthy diabetic low-salt  Filed Weights   11/25/17 0500 11/26/17 0500 11/27/17 0547  Weight: 79 kg 78.5 kg 79.2 kg    History of present illness:  77 year old male Cirrhosis + varices followed by Dr. Marin Andrews in [place] Combined diastolic systolic heart failure EF 25-30%, pulmonary HTN, cardiac amyloid, CKD 3 K light chain myeloma on chemo Admitted on 7/24 with increasing lethargy poor appetite On admission found to be A. fib RVR EKG showed A. fib with RVR cardiology consulted started on amiodarone and cardioverted 7/29 and done again Hospitalization complicated by hypotension bradycardia and was transiently on pressors Found to have worsening rectal bleeding 8/12 GI consulted Eliquis was held Utilized over the past couple of days  Hospital Course:  Acute/chronic systolic heart failure with amyloidosis TEE showed EF 10%-on milrinone and was discharged on this-picC was discontinued he had a line holiday until 8/14 Management per AHF team who will coordinate follow-up as an outpatient  Atrial fibrillation paroxysmal chads score 6 not ablation candidate-DCCV 7/29, 8/2 continue amiodarone-Eliquis as per cardiology Med adjustments as an outpatient  ?  PICC line infection/sepsis-blood cultures X2 8/5-8/9- completed IV cefepime and vancomycin-no further work-up  Wild? K light chain myeloma-Per oncology Dillon Andrews > famciclovir and Revlimid at this time  Dillon Andrews liver cirrhosis plus varices with history of Barrett's esophagus- Eliquis to resume on 8/17-thought to be hemorrhoidal bleed [colonoscopy 10/2015 1 polyp and grade 2 hemorrhoids diverticulosis, EGD 10/2015 small gastric nodules 5 cm hiatal hernia]  Acute kidney injury superimposed on CKD 3 baseline 1.6-stabilized  Type 2 diabetes mellitus A1c was 7.5-was on SSI-as an outpatient  patient was recently discontinued off of oral meds  Hemorrhoidsrectal bleeding appears lower GI bleed no further  work-up needed at this time-GI consulted recommended PPI and patient was not a good candidate for endoscopic procedure given high cardiac risk it was felt that this was either diverticular or hemorrhoidal in the end  [facility] care is seen the patient and it looks like patient wishes full scope of treatment including myeloma treatment   Procedures: Multiple as per above Consultations:  Cardiology  Oncology  Discharge Exam: Vitals:   11/27/17 0800 11/27/17 1200  BP: 118/65 (!) 105/59  Pulse: 69 64  Resp: 18 (!) 24  Temp: 98.6 F (37 C) 98.7 F (37.1 C)  SpO2: 95% 95%    General: Awake alert pleasant oriented no distress walked in the hallway with therapy earlier today-I cannot appreciate JVD even with hepatojugular reflex Cardiovascular: S1-S2 no murmur irregularly irregular Respiratory: Clinically clear no added sounds Abdomen soft nontender nondistended no rebound no guarding no lower extremity edema Neurologically intact moving all 4 limbs equally without deficit smile symmetric no icterus no pallor  Discharge Instructions   Discharge Instructions    Diet - low sodium heart healthy   Complete by:  As directed    Discharge instructions   Complete by:  As directed    Please note changes to Meds and adjustment primarily by cardiology who will follow you next week Please see Dr. Marin Andrews, Dr. Tempie Andrews and Dr. Ethelene Andrews as OP Labs in 1 week   Increase activity slowly   Complete by:  As directed      Allergies as of 11/27/2017   No Known Allergies     Medication List    STOP taking these medications   aspirin EC 81 MG tablet   carvedilol 6.25 MG tablet Commonly known as:  COREG   dexamethasone 4 MG tablet Commonly known as:  DECADRON   famciclovir 250 MG tablet Commonly known as:  FAMVIR   lenalidomide 20 MG capsule Commonly known as:  REVLIMID     TAKE these medications   amiodarone 200 MG tablet Commonly known as:  PACERONE Take 1 tablet (200  mg total) by mouth 2 (two) times daily.   apixaban 2.5 MG Tabs tablet Commonly known as:  ELIQUIS Take 1 tablet (2.5 mg total) by mouth 2 (two) times daily. Start taking on:  11/29/2017   dronabinol 2.5 MG capsule Commonly known as:  MARINOL Take 1 capsule (2.5 mg total) by mouth 2 (two) times daily before lunch and supper.   gabapentin 800 MG tablet Commonly known as:  NEURONTIN TAKE 1 TABLET BY MOUTH AT BEDTIME   glucose blood test strip Use as instructed   hydrocortisone 25 MG suppository Commonly known as:  ANUSOL-HC Place 1 suppository (25 mg total) rectally 2 (two) times daily.   magnesium oxide 400 MG tablet Commonly known as:  MAG-OX Take 400 mg by mouth daily.   Melatonin 5 MG Tabs Take 5 mg by mouth at bedtime.   milrinone 20 MG/100 ML Soln infusion Commonly known as:  PRIMACOR Inject 0.0099 mg/min into the vein continuous.   omeprazole 40 MG capsule Commonly known as:  PRILOSEC Take 1 capsule (40 mg total) by mouth daily.   ondansetron 4 MG tablet Commonly known as:  ZOFRAN Take 1 tablet (4 mg total) by mouth every 8 (eight) hours as needed for nausea or vomiting.   polyethylene glycol packet Commonly known as:  MIRALAX / GLYCOLAX Take 17 g by mouth daily.  Start taking on:  11/28/2017   potassium chloride SA 20 MEQ tablet Commonly known as:  K-DUR,KLOR-CON Take 1 tablet (20 mEq total) by mouth daily. What changed:    how much to take  how to take this  when to take this  additional instructions   PROBIOTIC DAILY PO Take 1 tablet by mouth.   rosuvastatin 10 MG tablet Commonly known as:  CRESTOR TAKE 1 TABLET BY MOUTH ONCE DAILY   senna-docusate 8.6-50 MG tablet Commonly known as:  Senokot-S Take 2 tablets by mouth 2 (two) times daily.   sertraline 25 MG tablet Commonly known as:  ZOLOFT Take 1 tablet (25 mg total) by mouth daily. Start taking on:  11/28/2017   torsemide 20 MG tablet Commonly known as:  DEMADEX Take 1 tablet (20 mg  total) by mouth daily. Can take 20 mg extra as needed. What changed:    when to take this  additional instructions   traZODone 50 MG tablet Commonly known as:  DESYREL TAKE 1 TABLET(50 MG) BY MOUTH AT BEDTIME   vitamin B-12 500 MCG tablet Commonly known as:  CYANOCOBALAMIN Take 500 mcg by mouth every other day.   VYNDAQEL 20 MG Caps Generic drug:  Tafamidis Meglumine (Cardiac) Take 80 mg by mouth daily. (4 capsules) What changed:    how much to take  additional instructions   zolpidem 5 MG tablet Commonly known as:  AMBIEN TAKE 1 TABLET BY MOUTH EVERY NIGHT AT BEDTIME AS NEEDED FOR SLEEP            Durable Medical Equipment  (From admission, onward)         Start     Ordered   11/21/17 1115  Heart failure home health orders  (Heart failure home health orders / Face to face)  Once    Comments:  Heart Failure Follow-up Care:  Verify follow-up appointments per Patient Discharge Instructions. Confirm transportation arranged. Reconcile home medications with discharge medication list. Remove discontinued medications from use. Assist patient/caregiver to manage medications using pill box. Reinforce low sodium food selection Assessments: Vital signs and oxygen saturation at each visit. Assess home environment for safety concerns, caregiver support and availability of low-sodium foods. Consult Education officer, museum, PT/OT, Dietitian, and CNA based on assessments. Perform comprehensive cardiopulmonary assessment. Notify MD for any change in condition or weight gain of 3 pounds in one day or 5 pounds in one week with symptoms. Daily Weights and Symptom Monitoring: Ensure patient has access to scales. Teach patient/caregiver to weigh daily before breakfast and after voiding using same scale and record.    Teach patient/caregiver to track weight and symptoms and when to notify Provider. Activity: Develop individualized activity plan with patient/caregiver.  HHRN 2 wk 2 for CP  assessment. Please use ReDS vest protocol as needed.  PT 2 wk 2 for CP Rehab.   [facility] to provide  Labs every other week to include BMET, Mg, and CBC with Diff. Additional as needed. Should be drawn via PERIPHERAL stick. NOT PICC line.   C0034 Milrinone 0.125 mcg/kg/min X 52 weeks A4221 Supplies for maintenance of drug infusion catheter A4222 Supplies for the external drug infusion per cassette or bag E0781 Ambulatory Infusion pump  Question Answer Andrews  Heart Failure Follow-up Care [facility] at [phone]   Obtain the following labs Other see comments   Lab frequency Other see comments   Fax lab results to [facility] Clinic at [phone]   Diet Low Sodium Heart Healthy  Fluid restrictions: 2000 mL Fluid   Skilled Nurse to notify MD of weight trends weekly for first 2 weeks. May fax or call: [facility] Clinic at [phone] (fax) or [facility] Diuretic Protocol to be used by [place] only ( to be ordered by Heart Failure Team Providers Only) Yes      11/21/17 1117         No Known Allergies Follow-up Information    [facility]-Home Follow up.   Specialty:  Home Health Services Why:  Registered Nurse, Physical Therapy- home IV milrinone and HF management Contact information: [place] [address] [place] Follow up on 12/04/2017.   Specialty:  Cardiology Why:  Heart Failure Followup at [facility]-12:00-Park/Dropoff at ER lot (enter under blue "Specialty Clinics" awning) or under [place] on [place] (new Women's entrance, garage code: 1500, elevator 1st floor). Take all am meds, bring all med bottles. Contact information: [address] [id number] [place] [phone]           The results of significant diagnostics from this hospitalization (including imaging,  microbiology, ancillary and laboratory) are listed below for reference.    Significant Diagnostic Studies: Dg Chest 1 View  Result Date: 11/17/2017 CLINICAL DATA:  77 year old male with a history of fever EXAM: CHEST  1 VIEW COMPARISON:  11/11/2017, 11/05/2017 FINDINGS: Cardiomediastinal silhouette unchanged. Calcifications of the aortic arch. Unchanged right upper extremity PICC which appears to terminate superior vena cava. Improving aeration at the lung bases with improved visualization of the right hemidiaphragm and the right heart border. Persistent opacity at the left base partially obscuring the left hemidiaphragm with blunting of left costophrenic angle. No pneumothorax. IMPRESSION: Improving aeration of the lungs, with near complete resolution of right pleural effusion, with residual trace left pleural effusion. Unchanged right upper extremity PICC Electronically Signed   By: Corrie Mckusick D.O.   On: 11/17/2017 15:51   Dg Chest 2 View  Result Date: 11/05/2017 CLINICAL DATA:  Mid chest tightness and shortness of breath for 2 days. EXAM: CHEST - 2 VIEW COMPARISON:  Single-view of the chest 09/15/2017. PA and lateral chest 07/10/2017. CT chest 03/20/2017. FINDINGS: There is cardiomegaly without edema. Small bilateral pleural effusions are seen on all the comparison examinations and have slightly increased since the most recent study. There is associated basilar atelectasis. Aortic atherosclerosis is noted. No acute or focal bony abnormality. IMPRESSION: Chronic small bilateral pleural effusions have increased somewhat since the most recent examination. Cardiomegaly without edema. Atherosclerosis. Electronically Signed   By: Inge Rise M.D.   On: 11/05/2017 12:35   Ir Fluoro Guide Cv Line Right  Result Date: 11/27/2017 INDICATION: 77 year old male with a history of heart failure EXAM: IMAGE GUIDED TUNNELED CENTRAL CATHETER MEDICATIONS: None ANESTHESIA/SEDATION: None FLUOROSCOPY TIME:   Fluoroscopy Time: 0 minutes 6 seconds (0 mGy). COMPLICATIONS: None PROCEDURE: After written informed consent was obtained, patient was placed in the supine position on angiographic table. Patency of the right internal jugular vein was confirmed with ultrasound with image documentation. Patient was prepped and draped in the usual sterile fashion including the right neck and right superior chest. Using ultrasound guidance, the skin and subcutaneous tissues overlying the right internal jugular vein were generously infiltrated with 1% lidocaine without epinephrine. Using ultrasound guidance, the right internal jugular vein was punctured with a micropuncture needle, and an  018 wire was advanced into the right heart confirming venous access. A small stab incision was made with an 11 blade scalpel. Peel-away sheath was placed over the wire, and then the wire was removed, marking the wire for estimation of internal catheter length. The chest wall was then generously infiltrated with 1% lidocaine for local anesthesia along the tissue tract. Small stab incision was made with 11 blade scalpel, and then the catheter was back tunneled to the puncture site at the right internal jugular vein. Catheter was pulled through the tract, with the catheter amputated at 20 cm. Catheter was advanced through the peel-away sheath, and the peel-away sheath was removed. Final image was stored. The catheter was anchored to the chest wall with 2 retention sutures, and Derma bond was used to seal the right internal jugular vein incision site and at the right chest wall. Patient tolerated the procedure well and remained hemodynamically stable throughout. No complications were encountered and no significant blood loss was encountered. IMPRESSION: Status post tunneled central catheter. Signed, Dulcy Fanny. Dellia Nims, RPVI [facility] Radiology Electronically Signed   By: Corrie Mckusick D.O.   On:  11/27/2017 07:24   Ir US Guide Vasc Access Right  Result Date: 11/27/2017 INDICATION: 77 year old male with a history of heart failure EXAM: IMAGE GUIDED TUNNELED CENTRAL CATHETER MEDICATIONS: None ANESTHESIA/SEDATION: None FLUOROSCOPY TIME:  Fluoroscopy Time: 0 minutes 6 seconds (0 mGy). COMPLICATIONS: None PROCEDURE: After written informed consent was obtained, patient was placed in the supine position on angiographic table. Patency of the right internal jugular vein was confirmed with ultrasound with image documentation. Patient was prepped and draped in the usual sterile fashion including the right neck and right superior chest. Using ultrasound guidance, the skin and subcutaneous tissues overlying the right internal jugular vein were generously infiltrated with 1% lidocaine without epinephrine. Using ultrasound guidance, the right internal jugular vein was punctured with a micropuncture needle, and an 018 wire was advanced into the right heart confirming venous access. A small stab incision was made with an 11 blade scalpel. Peel-away sheath was placed over the wire, and then the wire was removed, marking the wire for estimation of internal catheter length. The chest wall was then generously infiltrated with 1% lidocaine for local anesthesia along the tissue tract. Small stab incision was made with 11 blade scalpel, and then the catheter was back tunneled to the puncture site at the right internal jugular vein. Catheter was pulled through the tract, with the catheter amputated at 20 cm. Catheter was advanced through the peel-away sheath, and the peel-away sheath was removed. Final image was stored. The catheter was anchored to the chest wall with 2 retention sutures, and Derma bond was used to seal the right internal jugular vein incision site and at the right chest wall. Patient tolerated the procedure well and remained hemodynamically stable throughout. No complications were encountered and no significant  blood loss was encountered. IMPRESSION: Status post tunneled central catheter. Signed, Dulcy Fanny. Dellia Nims, RPVI [facility] Radiology Electronically Signed   By: Corrie Mckusick D.O.   On: 11/27/2017 07:24   Dg Chest Port 1 View  Result Date: 11/11/2017 CLINICAL DATA:  PICC line placement EXAM: PORTABLE CHEST 1 VIEW COMPARISON:  11/05/2017 FINDINGS: Right PICC line is in place with the tip at the cavoatrial junction. Cardiomegaly. Small bilateral effusions with bibasilar atelectasis or infiltrates, slightly worsened on the right since prior study. IMPRESSION: Right PICC line tip  at the cavoatrial junction. Bilateral effusions and bibasilar atelectasis or infiltrates, right greater than left, worsening on the right since prior study. Electronically Signed   By: Rolm Baptise M.D.   On: 11/11/2017 10:48   Dg Abd Portable 1v  Result Date: 11/17/2017 CLINICAL DATA:  77 year old male with abdominal pain and nausea with chest pain. Initial encounter. EXAM: PORTABLE ABDOMEN - 1 VIEW COMPARISON:  11/11/2017 chest x-ray.  11/25/2016 abdominal film. FINDINGS: Stool and gas filled portions of colon without gas distended small bowel or large bowel. The possibility of free intraperitoneal air cannot be assessed on a supine view. Left base consolidation may represent atelectasis or infiltrate. Mild degenerative changes lumbar spine.  Post cholecystectomy. IMPRESSION: No evidence of bowel obstruction. Electronically Signed   By: Genia Del M.D.   On: 11/17/2017 06:55   [place] Ekg Site Rite  Result Date: 11/07/2017 If Site Rite image not attached, placement could not be confirmed due to current cardiac rhythm.  US Abdomen Limited Ruq  Result Date: 11/18/2017 CLINICAL DATA:  Acute onset of vomiting. EXAM: ULTRASOUND ABDOMEN LIMITED RIGHT UPPER QUADRANT COMPARISON:  Right upper quadrant ultrasound performed 08/01/2017 FINDINGS: Gallbladder: Status post cholecystectomy.  No  retained stones seen. Common bile duct: Diameter: 0.2 cm, within normal limits in caliber. Liver: No focal lesion identified. Within normal limits in parenchymal echogenicity. Portal vein is patent on color Doppler imaging with normal direction of blood flow towards the liver. A small right-sided pleural effusion is noted. IMPRESSION: 1. Status post cholecystectomy.  No retained stones seen. 2. Small right-sided pleural effusion noted. Electronically Signed   By: Garald Balding M.D.   On: 11/18/2017 03:56    Microbiology: Recent Results (from the past 240 hour(s))  MRSA PCR Screening     Status: None   Collection Time: 11/17/17  8:27 PM  Result Value Ref Range Status   MRSA by PCR NEGATIVE NEGATIVE Final    Andrews:        The GeneXpert MRSA Assay (FDA approved for NASAL specimens only), is one component of a comprehensive MRSA colonization surveillance program. It is not intended to diagnose MRSA infection nor to guide or monitor treatment for MRSA infections. Performed at [facility], [place] [address], [place], [place] [address]   Culture, blood (routine x 2)     Status: None   Collection Time: 11/21/17  5:20 AM  Result Value Ref Range Status   Specimen Description BLOOD RIGHT ARM  Final   Special Requests   Final    BOTTLES DRAWN AEROBIC ONLY Blood Culture adequate volume   Culture   Final    NO GROWTH 5 DAYS Performed at [facility], [address], [place], [place] [address]    Report Status 11/26/2017 FINAL  Final  Culture, blood (routine x 2)     Status: None   Collection Time: 11/21/17  5:25 AM  Result Value Ref Range Status   Specimen Description BLOOD RIGHT ARM  Final   Special Requests   Final    BOTTLES DRAWN AEROBIC ONLY Blood Culture adequate volume   Culture   Final    NO GROWTH 5 DAYS Performed at [facility], [place] [address], [place], [place] [address]    Report Status 11/26/2017 FINAL  Final     Labs: Basic Metabolic Panel: Recent  Labs  Lab 11/23/17 0718 11/24/17 0158 11/25/17 0332 11/26/17 0210 11/27/17 0300  NA 141 141 139 138 140  K 4.2 3.5  3.6 3.7 3.7  CL 97* 96* 96* 96* 96*  CO2 34* 36* 35* 34* 33*  GLUCOSE 95 126* 148* 122* 119*  BUN 29* 24* 17 17 15   CREATININE 1.66* 1.53* 1.34* 1.36* 1.32*  CALCIUM 8.6* 8.3* 8.3* 8.5* 8.4*   Liver Function Tests: Recent Labs  Lab 11/26/17 0210  AST 30  ALT 26  ALKPHOS 160*  BILITOT 0.8  PROT 5.4*  ALBUMIN 2.6*   No results for input(s): LIPASE, AMYLASE in the last 168 hours. No results for input(s): AMMONIA in the last 168 hours. CBC: Recent Labs  Lab 11/25/17 0332 11/25/17 1433 11/26/17 0210 11/26/17 1429 11/27/17 0300  WBC 4.8 5.7 5.3 5.6 5.6  NEUTROABS 3.2  --   --   --   --   HGB 8.8* 9.0* 8.3* 9.8* 9.1*  HCT 28.5* 29.5* 26.8* 31.4* 29.7*  MCV 90.8 91.0 90.2 91.5 92.2  PLT 142* 173 151 179 178   Cardiac Enzymes: No results for input(s): CKTOTAL, CKMB, CKMBINDEX, TROPONINI in the last 168 hours. BNP: BNP (last 3 results) Recent Labs    06/25/17 1207 09/15/17 1401 11/05/17 1310  BNP 420.9* 393.0* 515.3*    ProBNP (last 3 results) No results for input(s): PROBNP in the last 8760 hours.  CBG: Recent Labs  Lab 11/26/17 1612 11/26/17 1740 11/26/17 2116 11/27/17 0806 11/27/17 1134  GLUCAP 139* 159* 115* 122* 175*       Signed:  Nita Sells MD   [facility] 11/27/2017, 12:58 PM

## 2017-11-27 NOTE — Care Management Note (Addendum)
Case Management Note Previous CM note completed by Bethena Roys, RN 11/07/2017, 11:29 AM   Patient Details  Name: Dillon Andrews MRN: [id number] Date of Birth: 08-19-1940  Subjective/Objective: Pt presented for Atrial Fib- Initiated on IV Amio gtt. Plan for Cardioversion question Monday. PT recommendations for [facility] PT Services. Agency List provided and patient chose [facility] for Services. Patient has DME RW at home.                    Action/Plan: Referral sent to [facility] with [facility] for PT. Pt will need HH PT order and F2F. SOC to begin within 24-48 hours post transition home. CM will provide patient with Eliquis 30 day free card. [facility] [address] has medication available. No further needs from CM at this time.    Expected Discharge Date:  11/27/17               Expected Discharge Plan:  [place]  In-House Referral:  NA  Discharge planning Services  CM Consult, Medication Assistance  Post Acute Care Choice:  Home Health Choice offered to:  Patient  DME Arranged:  N/A DME Agency:  NA  HH Arranged:  PT, RN, Nurse's Aide, Social Work(REDs Vest, home IV milrinone), RN HH Agency:  [facility]  Status of Service:  Completed, signed off  If discussed at [employer], dates discussed:    Additional Comments: 11/27/2017 Pt to discharge home today in care of wife.  Both wife and daughter will provide recommended supervision and have been trained by [facility] on the home requirements of milrinone.  [facility] aware that pt will discharge home today and confirmed that HF referral/orders including HH/ reds vest and Milrinone  are accepted.  Pt confirmed he has the free 30 day eliquis card and pharmacy can fill prescription today.  Pt has PCP and denied all barriers to returning home and paying for medications. Pt educated on importance of daily weights and adherence to low salt diet.   No other CM needs determined at the time this note was  written   11/25/17 Pt remains on IV milrinone - PICC line removed yesterday for line holiday, cultures in process.  Pt also actively bleeding - GI consulted  11/21/17- 1400- Kristi Webster RN, CM - noted pt to go home on IV milrinone, spoke with Pam at [facility] who is following for infusion needs- pt will also have REDs vest for HF management. CM in to see pt at bedside for transition of care needs- confirmed pt will use [facility] for Tina with [facility] has already spoken with pt and daughter. Plan is for pt to get PICC line Monday 8/12 and d/c home- pt denies any DME needs states he has a rollator at home. [facility], has 30 day free card for Eliquis to use on discharge. Pt reports that on of his 4 children will transport him home. CM will continue to follow for any further transition of care needs.   [place] 11-14-17 Jacqlyn Krauss, RN BSN [phone] Pt will benefit from [facility] RN once stable to transition home. Pt will need HH RN, PT & F2F once stable to transition home. [facility] aware that RN will be added. No further needs from CM at this time.    S/W DONNA @ [place] [facility] # [phone]    1. ELIQUIS 2.5 MG BID  COVER- YES  CO-PAY- $ 8.50  TIER- 3 DRUG  PRIOR APPROVAL -  NO   2. ELIQUIS  5 MG BID  COVER- YES  CO-PAY- $ 8.50  TIER- 3 DRUG  PRIOR APPROVAL- NO    PREFERRED PHARMACY: YES  [facility]ete Glatter DONNA @ [place] # [phone]    1. XARELTO 20 MG DAILY  COVER- YES  CO-PAY- $ 8.50  TIER- 3 DRUG  PRIOR APPROVAL- NO   PREFERRED PHARMACY : [facility]  Murvin Donning, RN 11/27/2017, 12:12 PM [facility] 1-22 Case Manager  [phone]

## 2017-11-27 NOTE — Care Management Important Message (Signed)
Important Message  Patient Details  Name: Dillon Andrews MRN: [id number] Date of Birth: 08-Aug-1940   Medicare Important Message Given:  Yes    Aunisty Reali P [place] 11/27/2017, 4:02 PM

## 2017-11-27 NOTE — Progress Notes (Signed)
Pt didn't have any bloody stool. Home care nurse came to change IV pump with connected their medication. Removed PIV access and patient and pt's daughters received discharge instructions. Changed Rt. Chest PICC dressing today. Pt's daughters took his all belongings. HS [employer]

## 2017-11-28 ENCOUNTER — Encounter (HOSPITAL_COMMUNITY): Payer: Medicare HMO

## 2017-11-28 DIAGNOSIS — K7460 Unspecified cirrhosis of liver: Secondary | ICD-10-CM | POA: Diagnosis not present

## 2017-11-28 DIAGNOSIS — I429 Cardiomyopathy, unspecified: Secondary | ICD-10-CM | POA: Diagnosis not present

## 2017-11-28 DIAGNOSIS — I4891 Unspecified atrial fibrillation: Secondary | ICD-10-CM | POA: Diagnosis not present

## 2017-11-28 DIAGNOSIS — N183 Chronic kidney disease, stage 3 (moderate): Secondary | ICD-10-CM | POA: Diagnosis not present

## 2017-11-28 DIAGNOSIS — I5043 Acute on chronic combined systolic (congestive) and diastolic (congestive) heart failure: Secondary | ICD-10-CM | POA: Diagnosis not present

## 2017-11-28 DIAGNOSIS — C9000 Multiple myeloma not having achieved remission: Secondary | ICD-10-CM | POA: Diagnosis not present

## 2017-11-28 DIAGNOSIS — D631 Anemia in chronic kidney disease: Secondary | ICD-10-CM | POA: Diagnosis not present

## 2017-11-28 DIAGNOSIS — E1122 Type 2 diabetes mellitus with diabetic chronic kidney disease: Secondary | ICD-10-CM | POA: Diagnosis not present

## 2017-11-28 DIAGNOSIS — I130 Hypertensive heart and chronic kidney disease with heart failure and stage 1 through stage 4 chronic kidney disease, or unspecified chronic kidney disease: Secondary | ICD-10-CM | POA: Diagnosis not present

## 2017-12-01 ENCOUNTER — Encounter (HOSPITAL_COMMUNITY): Payer: Medicare HMO

## 2017-12-01 ENCOUNTER — Telehealth: Payer: Self-pay | Admitting: Family Medicine

## 2017-12-01 ENCOUNTER — Telehealth (HOSPITAL_COMMUNITY): Payer: Self-pay | Admitting: *Deleted

## 2017-12-01 DIAGNOSIS — E1122 Type 2 diabetes mellitus with diabetic chronic kidney disease: Secondary | ICD-10-CM

## 2017-12-01 DIAGNOSIS — N183 Chronic kidney disease, stage 3 (moderate): Principal | ICD-10-CM

## 2017-12-01 DIAGNOSIS — I5043 Acute on chronic combined systolic (congestive) and diastolic (congestive) heart failure: Secondary | ICD-10-CM | POA: Diagnosis not present

## 2017-12-01 DIAGNOSIS — K7460 Unspecified cirrhosis of liver: Secondary | ICD-10-CM | POA: Diagnosis not present

## 2017-12-01 DIAGNOSIS — C9000 Multiple myeloma not having achieved remission: Secondary | ICD-10-CM | POA: Diagnosis not present

## 2017-12-01 DIAGNOSIS — Z8701 Personal history of pneumonia (recurrent): Secondary | ICD-10-CM | POA: Diagnosis not present

## 2017-12-01 DIAGNOSIS — I429 Cardiomyopathy, unspecified: Secondary | ICD-10-CM | POA: Diagnosis not present

## 2017-12-01 DIAGNOSIS — D631 Anemia in chronic kidney disease: Secondary | ICD-10-CM | POA: Diagnosis not present

## 2017-12-01 DIAGNOSIS — I130 Hypertensive heart and chronic kidney disease with heart failure and stage 1 through stage 4 chronic kidney disease, or unspecified chronic kidney disease: Secondary | ICD-10-CM | POA: Diagnosis not present

## 2017-12-01 DIAGNOSIS — I4891 Unspecified atrial fibrillation: Secondary | ICD-10-CM | POA: Diagnosis not present

## 2017-12-01 MED ORDER — GLUCOSE BLOOD VI STRP: ORAL_STRIP | 12 refills | Status: AC

## 2017-12-01 NOTE — Telephone Encounter (Signed)
Glucose blood test strips refill Last Refill:03/05/17 # 100; RF x 12 Last OV: 09/29/17; last Hgb. A1C 11/05/17 PCP: Dr. Ethelene Hal Pharmacy:[facility] on [address], [place]  Refilled per protocol

## 2017-12-01 NOTE — Telephone Encounter (Signed)
Vyndaqel was approved under [employer] through 04/14/18.  Pt was enrolled into [employer] for assistance w/cost, this was for $7800 valid 08/05/17 through 11/03/18.

## 2017-12-01 NOTE — Telephone Encounter (Signed)
Copied from [place] [id number]. Topic: Quick Communication - Rx Refill/Question >> Dec 01, 2017  8:50 AM Charlynn Court wrote: Medication: glucose blood test strip  Has the patient contacted their pharmacy? Yes.   (Agent: If no, request that the patient contact the pharmacy for the refill.) (Agent: If yes, when and what did the pharmacy advise?)  Preferred Pharmacy (with phone number or street name):     [id number] [address], [place] [phone] (Phone) [phone] (Fax)     Agent: Please be advised that RX refills may take up to 3 business days. We ask that you follow-up with your pharmacy.

## 2017-12-02 ENCOUNTER — Emergency Department (HOSPITAL_COMMUNITY): Payer: Medicare HMO

## 2017-12-02 ENCOUNTER — Encounter (HOSPITAL_COMMUNITY): Payer: Self-pay

## 2017-12-02 ENCOUNTER — Telehealth (HOSPITAL_COMMUNITY): Payer: Self-pay | Admitting: *Deleted

## 2017-12-02 ENCOUNTER — Inpatient Hospital Stay (HOSPITAL_COMMUNITY)
Admission: EM | Admit: 2017-12-02 | Discharge: 2017-12-04 | DRG: 194 | Disposition: A | Discharge status: Home Health | Payer: Medicare HMO | Attending: Internal Medicine | Admitting: Internal Medicine

## 2017-12-02 DIAGNOSIS — I4892 Unspecified atrial flutter: Secondary | ICD-10-CM | POA: Diagnosis not present

## 2017-12-02 DIAGNOSIS — I8510 Secondary esophageal varices without bleeding: Secondary | ICD-10-CM | POA: Diagnosis present

## 2017-12-02 DIAGNOSIS — Z7901 Long term (current) use of anticoagulants: Secondary | ICD-10-CM

## 2017-12-02 DIAGNOSIS — K219 Gastro-esophageal reflux disease without esophagitis: Secondary | ICD-10-CM | POA: Diagnosis present

## 2017-12-02 DIAGNOSIS — Z803 Family history of malignant neoplasm of breast: Secondary | ICD-10-CM | POA: Diagnosis not present

## 2017-12-02 DIAGNOSIS — E1122 Type 2 diabetes mellitus with diabetic chronic kidney disease: Secondary | ICD-10-CM | POA: Diagnosis not present

## 2017-12-02 DIAGNOSIS — Z79899 Other long term (current) drug therapy: Secondary | ICD-10-CM | POA: Diagnosis not present

## 2017-12-02 DIAGNOSIS — J90 Pleural effusion, not elsewhere classified: Secondary | ICD-10-CM | POA: Diagnosis present

## 2017-12-02 DIAGNOSIS — N183 Chronic kidney disease, stage 3 unspecified: Secondary | ICD-10-CM | POA: Diagnosis present

## 2017-12-02 DIAGNOSIS — Z809 Family history of malignant neoplasm, unspecified: Secondary | ICD-10-CM | POA: Diagnosis not present

## 2017-12-02 DIAGNOSIS — Z9841 Cataract extraction status, right eye: Secondary | ICD-10-CM | POA: Diagnosis not present

## 2017-12-02 DIAGNOSIS — C9000 Multiple myeloma not having achieved remission: Secondary | ICD-10-CM | POA: Diagnosis not present

## 2017-12-02 DIAGNOSIS — R0689 Other abnormalities of breathing: Secondary | ICD-10-CM | POA: Diagnosis not present

## 2017-12-02 DIAGNOSIS — E876 Hypokalemia: Secondary | ICD-10-CM | POA: Diagnosis present

## 2017-12-02 DIAGNOSIS — I5042 Chronic combined systolic (congestive) and diastolic (congestive) heart failure: Secondary | ICD-10-CM | POA: Diagnosis present

## 2017-12-02 DIAGNOSIS — Y95 Nosocomial condition: Secondary | ICD-10-CM | POA: Diagnosis present

## 2017-12-02 DIAGNOSIS — E7800 Pure hypercholesterolemia, unspecified: Secondary | ICD-10-CM | POA: Diagnosis present

## 2017-12-02 DIAGNOSIS — K7460 Unspecified cirrhosis of liver: Secondary | ICD-10-CM

## 2017-12-02 DIAGNOSIS — I4510 Unspecified right bundle-branch block: Secondary | ICD-10-CM | POA: Diagnosis not present

## 2017-12-02 DIAGNOSIS — D649 Anemia, unspecified: Secondary | ICD-10-CM | POA: Diagnosis present

## 2017-12-02 DIAGNOSIS — Z794 Long term (current) use of insulin: Secondary | ICD-10-CM | POA: Diagnosis not present

## 2017-12-02 DIAGNOSIS — D509 Iron deficiency anemia, unspecified: Secondary | ICD-10-CM | POA: Diagnosis not present

## 2017-12-02 DIAGNOSIS — J189 Pneumonia, unspecified organism: Secondary | ICD-10-CM | POA: Diagnosis not present

## 2017-12-02 DIAGNOSIS — E611 Iron deficiency: Secondary | ICD-10-CM | POA: Diagnosis present

## 2017-12-02 DIAGNOSIS — K7581 Nonalcoholic steatohepatitis (NASH): Secondary | ICD-10-CM | POA: Diagnosis present

## 2017-12-02 DIAGNOSIS — I480 Paroxysmal atrial fibrillation: Secondary | ICD-10-CM | POA: Diagnosis not present

## 2017-12-02 DIAGNOSIS — E854 Organ-limited amyloidosis: Secondary | ICD-10-CM | POA: Diagnosis present

## 2017-12-02 DIAGNOSIS — I4891 Unspecified atrial fibrillation: Secondary | ICD-10-CM | POA: Diagnosis not present

## 2017-12-02 DIAGNOSIS — E119 Type 2 diabetes mellitus without complications: Secondary | ICD-10-CM | POA: Diagnosis not present

## 2017-12-02 DIAGNOSIS — I43 Cardiomyopathy in diseases classified elsewhere: Secondary | ICD-10-CM

## 2017-12-02 DIAGNOSIS — R0902 Hypoxemia: Secondary | ICD-10-CM | POA: Diagnosis not present

## 2017-12-02 DIAGNOSIS — Z9842 Cataract extraction status, left eye: Secondary | ICD-10-CM

## 2017-12-02 DIAGNOSIS — R778 Other specified abnormalities of plasma proteins: Secondary | ICD-10-CM

## 2017-12-02 DIAGNOSIS — I509 Heart failure, unspecified: Secondary | ICD-10-CM | POA: Diagnosis not present

## 2017-12-02 DIAGNOSIS — I130 Hypertensive heart and chronic kidney disease with heart failure and stage 1 through stage 4 chronic kidney disease, or unspecified chronic kidney disease: Secondary | ICD-10-CM | POA: Diagnosis not present

## 2017-12-02 DIAGNOSIS — I5043 Acute on chronic combined systolic (congestive) and diastolic (congestive) heart failure: Secondary | ICD-10-CM | POA: Diagnosis not present

## 2017-12-02 DIAGNOSIS — Z87891 Personal history of nicotine dependence: Secondary | ICD-10-CM | POA: Diagnosis not present

## 2017-12-02 DIAGNOSIS — Z66 Do not resuscitate: Secondary | ICD-10-CM | POA: Diagnosis present

## 2017-12-02 DIAGNOSIS — Z85828 Personal history of other malignant neoplasm of skin: Secondary | ICD-10-CM

## 2017-12-02 DIAGNOSIS — Z96652 Presence of left artificial knee joint: Secondary | ICD-10-CM | POA: Diagnosis present

## 2017-12-02 DIAGNOSIS — Z961 Presence of intraocular lens: Secondary | ICD-10-CM | POA: Diagnosis present

## 2017-12-02 DIAGNOSIS — E8581 Light chain (AL) amyloidosis: Secondary | ICD-10-CM | POA: Diagnosis not present

## 2017-12-02 DIAGNOSIS — J181 Lobar pneumonia, unspecified organism: Secondary | ICD-10-CM | POA: Diagnosis not present

## 2017-12-02 LAB — CBC WITH DIFFERENTIAL/PLATELET
Abs Immature Granulocytes: 0 10*3/uL (ref 0.0–0.1)
BASOS ABS: 0 10*3/uL (ref 0.0–0.1)
BASOS PCT: 0 %
EOS ABS: 0 10*3/uL (ref 0.0–0.7)
EOS PCT: 1 %
HCT: 31.7 % — ABNORMAL LOW (ref 39.0–52.0)
HEMOGLOBIN: 9.9 g/dL — AB (ref 13.0–17.0)
Immature Granulocytes: 0 %
Lymphocytes Relative: 9 %
Lymphs Abs: 0.8 10*3/uL (ref 0.7–4.0)
MCH: 28.6 pg (ref 26.0–34.0)
MCHC: 31.2 g/dL (ref 30.0–36.0)
MCV: 91.6 fL (ref 78.0–100.0)
MONO ABS: 1 10*3/uL (ref 0.1–1.0)
Monocytes Relative: 11 %
Neutro Abs: 7 10*3/uL (ref 1.7–7.7)
Neutrophils Relative %: 79 %
PLATELETS: 216 10*3/uL (ref 150–400)
RBC: 3.46 MIL/uL — ABNORMAL LOW (ref 4.22–5.81)
RDW: 19.4 % — AB (ref 11.5–15.5)
WBC: 8.8 10*3/uL (ref 4.0–10.5)

## 2017-12-02 LAB — URINALYSIS, ROUTINE W REFLEX MICROSCOPIC
Bilirubin Urine: NEGATIVE
GLUCOSE, UA: NEGATIVE mg/dL
Hgb urine dipstick: NEGATIVE
KETONES UR: NEGATIVE mg/dL
LEUKOCYTES UA: NEGATIVE
Nitrite: NEGATIVE
PH: 7 (ref 5.0–8.0)
Protein, ur: NEGATIVE mg/dL
Specific Gravity, Urine: 1.006 (ref 1.005–1.030)

## 2017-12-02 LAB — COMPREHENSIVE METABOLIC PANEL
ALT: 25 U/L (ref 0–44)
AST: 35 U/L (ref 15–41)
Albumin: 2.8 g/dL — ABNORMAL LOW (ref 3.5–5.0)
Alkaline Phosphatase: 140 U/L — ABNORMAL HIGH (ref 38–126)
Anion gap: 12 (ref 5–15)
BILIRUBIN TOTAL: 1 mg/dL (ref 0.3–1.2)
BUN: 24 mg/dL — AB (ref 8–23)
CALCIUM: 8.1 mg/dL — AB (ref 8.9–10.3)
CO2: 32 mmol/L (ref 22–32)
CREATININE: 1.51 mg/dL — AB (ref 0.61–1.24)
Chloride: 96 mmol/L — ABNORMAL LOW (ref 98–111)
GFR, EST AFRICAN AMERICAN: 49 mL/min — AB (ref >60)
GFR, EST NON AFRICAN AMERICAN: 42 mL/min — AB (ref >60)
Glucose, Bld: 137 mg/dL — ABNORMAL HIGH (ref 70–99)
Potassium: 3.1 mmol/L — ABNORMAL LOW (ref 3.5–5.1)
Sodium: 140 mmol/L (ref 135–145)
Total Protein: 6 g/dL — ABNORMAL LOW (ref 6.5–8.1)

## 2017-12-02 LAB — CBG MONITORING, ED: Glucose-Capillary: 146 mg/dL — ABNORMAL HIGH (ref 70–99)

## 2017-12-02 LAB — BRAIN NATRIURETIC PEPTIDE: B Natriuretic Peptide: 349.6 pg/mL — ABNORMAL HIGH (ref 0.0–100.0)

## 2017-12-02 LAB — PROTIME-INR
INR: 1.34
PROTHROMBIN TIME: 16.5 s — AB (ref 11.4–15.2)

## 2017-12-02 LAB — I-STAT CG4 LACTIC ACID, ED: LACTIC ACID, VENOUS: 1.49 mmol/L (ref 0.5–1.9)

## 2017-12-02 MED ORDER — MELATONIN 3 MG PO TABS
6.0000 mg | ORAL_TABLET | Freq: Every day | ORAL | Status: DC
  Administered 2017-12-02 – 2017-12-03 (×2): 6 mg via ORAL
  Filled 2017-12-02 (×2): qty 2

## 2017-12-02 MED ORDER — VANCOMYCIN HCL IN DEXTROSE 750-5 MG/150ML-% IV SOLN
750.0000 mg | Freq: Two times a day (BID) | INTRAVENOUS | Status: DC
  Filled 2017-12-02: qty 150

## 2017-12-02 MED ORDER — PANTOPRAZOLE SODIUM 40 MG PO TBEC
80.0000 mg | DELAYED_RELEASE_TABLET | Freq: Every day | ORAL | Status: DC
  Administered 2017-12-03 – 2017-12-04 (×2): 80 mg via ORAL
  Filled 2017-12-02 (×3): qty 2

## 2017-12-02 MED ORDER — MILRINONE LACTATE IN DEXTROSE 20-5 MG/100ML-% IV SOLN
0.1250 ug/kg/min | INTRAVENOUS | Status: DC
  Administered 2017-12-02 – 2017-12-03 (×3): 0.125 ug/kg/min via INTRAVENOUS
  Filled 2017-12-02 (×2): qty 100

## 2017-12-02 MED ORDER — TAFAMIDIS MEGLUMINE (CARDIAC) 20 MG PO CAPS
80.0000 mg | ORAL_CAPSULE | Freq: Every day | ORAL | Status: DC
  Administered 2017-12-02 – 2017-12-03 (×2): 80 mg via ORAL
  Filled 2017-12-02: qty 4

## 2017-12-02 MED ORDER — ONDANSETRON HCL 4 MG PO TABS: 4.0000 mg | ORAL_TABLET | Freq: Three times a day (TID) | ORAL | Status: DC | PRN

## 2017-12-02 MED ORDER — SERTRALINE HCL 25 MG PO TABS
25.0000 mg | ORAL_TABLET | Freq: Every day | ORAL | Status: DC
  Administered 2017-12-03 – 2017-12-04 (×2): 25 mg via ORAL
  Filled 2017-12-02 (×2): qty 1

## 2017-12-02 MED ORDER — SODIUM CHLORIDE 0.9 % IV SOLN
2.0000 g | INTRAVENOUS | Status: DC
  Administered 2017-12-03: 2 g via INTRAVENOUS
  Filled 2017-12-02 (×2): qty 2

## 2017-12-02 MED ORDER — MAGNESIUM OXIDE 400 (241.3 MG) MG PO TABS
400.0000 mg | ORAL_TABLET | Freq: Every day | ORAL | Status: DC
  Administered 2017-12-03 – 2017-12-04 (×2): 400 mg via ORAL
  Filled 2017-12-02 (×3): qty 1

## 2017-12-02 MED ORDER — POTASSIUM CHLORIDE CRYS ER 20 MEQ PO TBCR
40.0000 meq | EXTENDED_RELEASE_TABLET | Freq: Once | ORAL | Status: AC
  Administered 2017-12-02: 40 meq via ORAL
  Filled 2017-12-02: qty 2

## 2017-12-02 MED ORDER — APIXABAN 2.5 MG PO TABS
2.5000 mg | ORAL_TABLET | Freq: Two times a day (BID) | ORAL | Status: DC
  Administered 2017-12-02 – 2017-12-04 (×4): 2.5 mg via ORAL
  Filled 2017-12-02 (×4): qty 1

## 2017-12-02 MED ORDER — POLYETHYLENE GLYCOL 3350 17 G PO PACK
17.0000 g | PACK | Freq: Every day | ORAL | Status: DC
  Filled 2017-12-02 (×3): qty 1

## 2017-12-02 MED ORDER — INSULIN ASPART 100 UNIT/ML ~~LOC~~ SOLN
0.0000 [IU] | Freq: Three times a day (TID) | SUBCUTANEOUS | Status: DC
  Administered 2017-12-03: 1 [IU] via SUBCUTANEOUS
  Administered 2017-12-04: 2 [IU] via SUBCUTANEOUS

## 2017-12-02 MED ORDER — VANCOMYCIN HCL IN DEXTROSE 750-5 MG/150ML-% IV SOLN
750.0000 mg | Freq: Two times a day (BID) | INTRAVENOUS | Status: DC
  Administered 2017-12-03 – 2017-12-04 (×3): 750 mg via INTRAVENOUS
  Filled 2017-12-02 (×3): qty 150

## 2017-12-02 MED ORDER — AMIODARONE HCL 200 MG PO TABS
200.0000 mg | ORAL_TABLET | Freq: Two times a day (BID) | ORAL | Status: DC
  Administered 2017-12-02 – 2017-12-04 (×4): 200 mg via ORAL
  Filled 2017-12-02 (×4): qty 1

## 2017-12-02 MED ORDER — SENNOSIDES-DOCUSATE SODIUM 8.6-50 MG PO TABS
2.0000 | ORAL_TABLET | Freq: Two times a day (BID) | ORAL | Status: DC
  Filled 2017-12-02 (×4): qty 2

## 2017-12-02 MED ORDER — SODIUM CHLORIDE 0.9 % IV SOLN: 1.0000 g | INTRAVENOUS | Status: DC

## 2017-12-02 MED ORDER — HYDROCORTISONE ACETATE 25 MG RE SUPP
25.0000 mg | Freq: Two times a day (BID) | RECTAL | Status: DC
  Filled 2017-12-02 (×3): qty 1

## 2017-12-02 MED ORDER — ZOLPIDEM TARTRATE 5 MG PO TABS
5.0000 mg | ORAL_TABLET | Freq: Every evening | ORAL | Status: DC | PRN
  Administered 2017-12-02 – 2017-12-03 (×2): 5 mg via ORAL
  Filled 2017-12-02 (×2): qty 1

## 2017-12-02 MED ORDER — DRONABINOL 2.5 MG PO CAPS
2.5000 mg | ORAL_CAPSULE | Freq: Two times a day (BID) | ORAL | Status: DC
  Administered 2017-12-03 – 2017-12-04 (×3): 2.5 mg via ORAL
  Filled 2017-12-02 (×3): qty 1

## 2017-12-02 MED ORDER — GABAPENTIN 800 MG PO TABS
800.0000 mg | ORAL_TABLET | Freq: Every day | ORAL | Status: DC
  Filled 2017-12-02: qty 1

## 2017-12-02 MED ORDER — VANCOMYCIN HCL IN DEXTROSE 1-5 GM/200ML-% IV SOLN
1000.0000 mg | Freq: Once | INTRAVENOUS | Status: AC
  Administered 2017-12-02: 1000 mg via INTRAVENOUS
  Filled 2017-12-02: qty 200

## 2017-12-02 MED ORDER — TRAZODONE HCL 50 MG PO TABS
50.0000 mg | ORAL_TABLET | Freq: Every day | ORAL | Status: DC
  Administered 2017-12-02 – 2017-12-03 (×2): 50 mg via ORAL
  Filled 2017-12-02 (×2): qty 1

## 2017-12-02 MED ORDER — ROSUVASTATIN CALCIUM 10 MG PO TABS
10.0000 mg | ORAL_TABLET | Freq: Every day | ORAL | Status: DC
  Administered 2017-12-03 – 2017-12-04 (×2): 10 mg via ORAL
  Filled 2017-12-02 (×2): qty 1

## 2017-12-02 MED ORDER — SODIUM CHLORIDE 0.9 % IV SOLN
2.0000 g | Freq: Once | INTRAVENOUS | Status: AC
  Administered 2017-12-02: 2 g via INTRAVENOUS
  Filled 2017-12-02: qty 2

## 2017-12-02 MED ORDER — VANCOMYCIN HCL IN DEXTROSE 750-5 MG/150ML-% IV SOLN
750.0000 mg | Freq: Once | INTRAVENOUS | Status: AC
  Administered 2017-12-02: 750 mg via INTRAVENOUS
  Filled 2017-12-02: qty 150

## 2017-12-02 NOTE — ED Provider Notes (Addendum)
[facility]ERGENCY DEPARTMENT Provider Note   CSN: [id number] Arrival date & time: 12/02/17  1833     History   Chief Complaint Chief Complaint  Patient presents with  . Fever    HPI Dillon Andrews is a 77 y.o. male.  Patient with history of heart failure on milrinone drip through a PICC line presents the ED with fever.  Patient recent admission to the hospital and received PICC line at that time.  He has had some shortness of breath but denies any cough, sputum production, urinary symptoms.  Had one episode of nausea today.  Denies any chest pain.  No abdominal pain.  Patient went to cardiology office today and since he had a fever and recent hospital admission and with PICC line was sent for evaluation.  The history is provided by the patient.  Fever   This is a new problem. The current episode started 12 to 24 hours ago. The problem occurs constantly. The problem has not changed since onset.The maximum temperature noted was 101 to 101.9 F. Pertinent negatives include no chest pain, no fussiness, no sleepiness, no diarrhea, no vomiting, no congestion, no headaches, no sore throat, no tugging at ear, no muscle aches and no cough. He has tried nothing for the symptoms. The treatment provided no relief.    Past Medical History:  Diagnosis Date  . Anemia 11/2016  . Arthritis    "some; hands/fingers" (06/25/2017)  . Atrial tachycardia ([place])   . Barrett's esophagus   . Basal cell carcinoma    "face, arms, hands" (06/25/2017)  . Chicken pox   . Cholelithiasis 11/2016   S/P LAP CHOLE  . Chronic combined systolic and diastolic CHF (congestive heart failure) ([place])   . CKD (chronic kidney disease), stage III ([place])   . Esophageal varices (HCC)    Secondary to NAFLD  . Family history of adverse reaction to anesthesia    "daughter's sat drop very low; last OR had to be reintubated" (06/25/2017)  . GERD (gastroesophageal reflux disease)   . Goals of care,  counseling/discussion 10/22/2017  . High cholesterol   . Hypertension   . Kappa light chain myeloma ([place]) 10/22/2017  . Mitral regurgitation   . NAFLD (nonalcoholic fatty liver disease)   . NICM (nonischemic cardiomyopathy) ([place])    Echo 06/2017: Severe LVH.  EF 25 to 30% with mild HK of the baseline after septal wall.  Moderate to severely dilated right atrium, moderately dilated RV. (Concern fr ? amyloidosis).  . NSVT (nonsustained ventricular tachycardia) ([place])   . Pneumonia 09/2016  . RBBB   . Tricuspid regurgitation   . Type II diabetes mellitus [facility])     Patient Active Problem List   Diagnosis Date Noted  . Chronic combined systolic and diastolic CHF (congestive heart failure) ([place]) 12/02/2017  . Paroxysmal atrial flutter ([place]) 12/02/2017  . Abdominal distension   . Hematochezia   . Acute blood loss anemia   . Rectal bleeding   . Cardiogenic shock ([place])   . Palliative care by specialist   . Atrial fibrillation with RVR ([place]) 11/05/2017  . Leukocytosis 11/05/2017  . Elevated troponin 11/05/2017  . Elevated brain natriuretic peptide (BNP) level, acute on chronic 11/05/2017  . Hypotension 11/05/2017  . Chest pain   . Kappa light chain myeloma ([place]) 10/22/2017  . Goals of care, counseling/discussion 10/22/2017  . Weakness generalized 08/18/2017  . Anemia 08/18/2017  . Cardiac amyloidosis ([place]) 07/18/2017  . Hypokalemia 06/25/2017  . Non-ischemic  cardiomyopathy ([place]) 05/12/2017  . Mitral regurgitation 05/12/2017  . Other secondary pulmonary hypertension ([place]) 05/12/2017  . RBBB 05/12/2017  . Type II diabetes mellitus ([place])   . Essential hypertension   . CKD (chronic kidney disease), stage III ([place])   . SOB (shortness of breath) 03/05/2017  . Insomnia 03/05/2017  . Stress 03/05/2017  . Restless leg syndrome 03/05/2017  . Cholecystitis 11/22/2016  . HCAP (healthcare-associated pneumonia) 11/19/2016  . CKD (chronic kidney disease) stage 3, GFR 30-59 ml/min (HCC), baseline  Cr 1.5 11/19/2016  . DM2 (diabetes mellitus, type 2) ([place]) 11/19/2016  . Acute on chronic systolic (congestive) heart failure ([place]) 11/19/2016  . Bilateral pleural effusion 11/19/2016  . Epigastric pain 11/19/2016  . Abnormal serum protein electrophoresis 11/19/2016  . Esophageal varices in cirrhosis ([place]) 11/19/2016    Past Surgical History:  Procedure Laterality Date  . BACK SURGERY    . BASAL CELL CARCINOMA EXCISION Right    temple; "had to do skin graft"  . CARDIAC CATHETERIZATION  09/2016   Minimal, non-occlusive CAD, EF 45% [facility])  . CARDIOVERSION N/A 11/10/2017   Procedure: CARDIOVERSION;  Surgeon: Jolaine Artist, MD;  Location: [facility] ENDOSCOPY;  Service: Cardiovascular;  Laterality: N/A;  . CARDIOVERSION N/A 11/14/2017   Procedure: CARDIOVERSION;  Surgeon: Jolaine Artist, MD;  Location: [facility] ENDOSCOPY;  Service: Cardiovascular;  Laterality: N/A;  . CARPAL TUNNEL RELEASE Bilateral   . CATARACT EXTRACTION W/ INTRAOCULAR LENS  IMPLANT, BILATERAL Bilateral   . CHOLECYSTECTOMY N/A 11/22/2016   Procedure: LAPAROSCOPIC CHOLECYSTECTOMY;  Surgeon: Coralie Keens, MD;  Location: [facility];  Service: General;  Laterality: N/A;  . IR FLUORO GUIDE CV LINE RIGHT  11/26/2017  . IR US GUIDE VASC ACCESS RIGHT  11/26/2017  . JOINT REPLACEMENT    . LUMBAR DISC SURGERY  X 2  . TEE WITHOUT CARDIOVERSION N/A 11/10/2017   Procedure: TRANSESOPHAGEAL ECHOCARDIOGRAM (TEE);  Surgeon: Jolaine Artist, MD;  Location: [facility] ENDOSCOPY;  Service: Cardiovascular;  Laterality: N/A;  . TONSILLECTOMY AND ADENOIDECTOMY    . TOTAL KNEE ARTHROPLASTY Left 2010  . TRANSESOPHAGEAL ECHOCARDIOGRAM  09/2016   Mild global LV hypokinesis. Moderate MR. Moderate TR. [facility])  . TRANSTHORACIC ECHOCARDIOGRAM  10/2016   [facility]) EF 35-40% with moderate HK. Normal RV function. Moderately severe TR.  Marland Kitchen TRANSTHORACIC ECHOCARDIOGRAM  03/2017; 06/2017    A) ([place]) mild concentric LVH.  EF of 40-45%. Diffuse HK with no RWMA. GR 2 DD. Septal dyssynergy. Mild MR, moderate TR. Severe RA dilation..;; B) Severe LVH.  EF 25 to 30% with mild HK of Septal wall.  Mod-severely dilated RA & Mod dilated RV.        Home Medications    Prior to Admission medications   Medication Sig Start Date End Date Taking? Authorizing Provider  amiodarone (PACERONE) 200 MG tablet Take 1 tablet (200 mg total) by mouth 2 (two) times daily. 11/27/17  Yes Shirley Friar, PA-C  apixaban (ELIQUIS) 2.5 MG TABS tablet Take 1 tablet (2.5 mg total) by mouth 2 (two) times daily. 11/29/17  Yes Nita Sells, MD  dronabinol (MARINOL) 2.5 MG capsule Take 1 capsule (2.5 mg total) by mouth 2 (two) times daily before lunch and supper. 10/22/17  Yes Ennever, Rudell Cobb, MD  gabapentin (NEURONTIN) 800 MG tablet TAKE 1 TABLET BY MOUTH AT BEDTIME Patient taking differently: Take 800 mg by mouth at bedtime.  09/23/17  Yes [name], MD  hydrocortisone (ANUSOL-HC) 25 MG suppository Place  1 suppository (25 mg total) rectally 2 (two) times daily. Patient taking differently: Place 25 mg rectally 2 (two) times daily as needed for hemorrhoids.  11/27/17  Yes Nita Sells, MD  magnesium oxide (MAG-OX) 400 MG tablet Take 400 mg by mouth daily.   Yes [provider]  Melatonin 5 MG TABS Take 5 mg by mouth at bedtime.    Yes [provider]  milrinone (PRIMACOR) 20 MG/100 ML SOLN infusion Inject 0.0099 mg/min into the vein continuous. 11/27/17  Yes Shirley Friar, PA-C  omeprazole (PRILOSEC) 40 MG capsule Take 1 capsule (40 mg total) by mouth daily. 07/31/17  Yes [name], MD  ondansetron (ZOFRAN) 4 MG tablet Take 1 tablet (4 mg total) by mouth every 8 (eight) hours as needed for nausea or vomiting. 10/08/17  Yes [name], MD  polyethylene glycol [facility] / Floria Raveling) packet Take 17 g by mouth daily. Patient taking differently: Take 17 g by mouth daily as  needed (constipation). Mix with 8 oz orange juice and drink 11/28/17  Yes Samtani, Jai-Gurmukh, MD  VYNDAQEL 20 MG CAPS Take 80 mg by mouth daily. (4 capsules) Patient taking differently: Take 80 mg by mouth every evening. (4 capsules) 11/27/17  Yes Shirley Friar, PA-C  glucose blood test strip Use as instructed 12/01/17   [name], MD  potassium chloride SA (K-DUR,KLOR-CON) 20 MEQ tablet Take 1 tablet (20 mEq total) by mouth daily. 11/27/17   Shirley Friar, PA-C  Probiotic Product (PROBIOTIC DAILY PO) Take 1 tablet by mouth.    [provider]  rosuvastatin (CRESTOR) 10 MG tablet TAKE 1 TABLET BY MOUTH ONCE DAILY 10/29/17   [name], MD  senna-docusate (SENOKOT-S) 8.6-50 MG tablet Take 2 tablets by mouth 2 (two) times daily. 11/27/17   Nita Sells, MD  sertraline (ZOLOFT) 25 MG tablet Take 1 tablet (25 mg total) by mouth daily. 11/28/17   Nita Sells, MD  torsemide (DEMADEX) 20 MG tablet Take 1 tablet (20 mg total) by mouth daily. Can take 20 mg extra as needed. 11/27/17   Shirley Friar, PA-C  traZODone (DESYREL) 50 MG tablet TAKE 1 TABLET(50 MG) BY MOUTH AT BEDTIME 10/29/17   [name], MD  vitamin B-12 (CYANOCOBALAMIN) 500 MCG tablet Take 500 mcg by mouth every other day.    [provider]  zolpidem (AMBIEN) 5 MG tablet TAKE 1 TABLET BY MOUTH EVERY NIGHT AT BEDTIME AS NEEDED FOR SLEEP 10/23/17   [name], MD    Family History Family History  Problem Relation Age of Onset  . Heart failure Mother   . Cancer Father   . Cataracts Father   . Breast cancer Sister   . Alcohol abuse Brother   . Liver disease Brother   . Autoimmune disease Daughter        Bechet's syndrome  . Cirrhosis Neg Hx     Social History Social History   Tobacco Use  . Smoking status: Former Smoker    Packs/day: 0.30    Years: 15.00    Pack years: 4.50    Types: Cigarettes    Last attempt to quit:  03/17/1977    Years since quitting: 40.7  . Smokeless tobacco: Never Used  Substance Use Topics  . Alcohol use: Yes    Comment: 06/25/2017 "nothing since 09/2016;  used to have glass of wine q hs"  . Drug use: No     Allergies   Patient has no  known allergies.   Review of Systems Review of Systems  Constitutional: Positive for fever. Negative for chills.  HENT: Negative for congestion, ear pain and sore throat.   Eyes: Negative for pain and visual disturbance.  Respiratory: Positive for shortness of breath. Negative for cough.   Cardiovascular: Negative for chest pain and palpitations.  Gastrointestinal: Negative for abdominal pain, diarrhea and vomiting.  Genitourinary: Negative for dysuria and hematuria.  Musculoskeletal: Negative for arthralgias and back pain.  Skin: Negative for color change and rash.  Neurological: Negative for seizures, syncope and headaches.  All other systems reviewed and are negative.    Physical Exam Updated Vital Signs  ED Triage Vitals  Enc Vitals Group     BP 12/02/17 1841 103/63     Pulse Rate 12/02/17 1841 83     Resp 12/02/17 1841 (!) 32     Temp 12/02/17 1841 99.1 F (37.3 C)     Temp Source 12/02/17 1841 Oral     SpO2 12/02/17 1835 95 %     Weight 12/02/17 1841 174 lb 9.7 oz (79.2 kg)     Height 12/02/17 1841 5' 9"  (1.753 m)     Head Circumference --      Peak Flow --      Pain Score 12/02/17 1841 0     Pain Loc --      Pain Edu? --      Excl. in [place]? --     Physical Exam  Constitutional: He is oriented to person, place, and time. He appears well-developed and well-nourished.  HENT:  Head: Normocephalic and atraumatic.  Eyes: Pupils are equal, round, and reactive to light. Conjunctivae and EOM are normal.  Neck: Normal range of motion. Neck supple.  Cardiovascular: Normal rate, regular rhythm, normal heart sounds and intact distal pulses.  No murmur heard. Pulmonary/Chest: Effort normal and breath sounds normal. No respiratory  distress.  Abdominal: Soft. Bowel sounds are normal. There is no tenderness. There is no guarding.  Musculoskeletal: Normal range of motion. He exhibits no edema.  Neurological: He is alert and oriented to person, place, and time.  Skin: Skin is warm and dry. Capillary refill takes less than 2 seconds.  Psychiatric: He has a normal mood and affect.  Nursing note and vitals reviewed.    ED Treatments / Results  Labs (all labs ordered are listed, but only abnormal results are displayed) Labs Reviewed  COMPREHENSIVE METABOLIC PANEL - Abnormal; Notable for the following components:      Result Value   Potassium 3.1 (*)    Chloride 96 (*)    Glucose, Bld 137 (*)    BUN 24 (*)    Creatinine, Ser 1.51 (*)    Calcium 8.1 (*)    Total Protein 6.0 (*)    Albumin 2.8 (*)    Alkaline Phosphatase 140 (*)    GFR calc non Af Amer 42 (*)    GFR calc Af Amer 49 (*)    All other components within normal limits  CBC WITH DIFFERENTIAL/PLATELET - Abnormal; Notable for the following components:   RBC 3.46 (*)    Hemoglobin 9.9 (*)    HCT 31.7 (*)    RDW 19.4 (*)    All other components within normal limits  PROTIME-INR - Abnormal; Notable for the following components:   Prothrombin Time 16.5 (*)    All other components within normal limits  BRAIN NATRIURETIC PEPTIDE - Abnormal; Notable for the following components:   B Natriuretic  Peptide 349.6 (*)    All other components within normal limits  CULTURE, BLOOD (ROUTINE X 2)  CULTURE, BLOOD (ROUTINE X 2)  EXPECTORATED SPUTUM ASSESSMENT W REFEX TO RESP CULTURE  GRAM STAIN  URINALYSIS, ROUTINE W REFLEX MICROSCOPIC  HIV ANTIBODY (ROUTINE TESTING)  STREP PNEUMONIAE URINARY ANTIGEN  CBC  BASIC METABOLIC PANEL  COOXEMETRY PANEL  COOXEMETRY PANEL  I-STAT CG4 LACTIC ACID, ED  I-STAT TROPONIN, ED  I-STAT CG4 LACTIC ACID, ED    EKG EKG Interpretation  Date/Time:  Tuesday December 02 2017 18:38:27 EDT Ventricular Rate:  88 PR Interval:    QRS  Duration: 178 QT Interval:  491 QTC Calculation: 595 R Axis:   -94 Text Interpretation:  Atrial flutter Right bundle branch block Baseline wander in lead(s) V2 Confirmed by Lennice Sites [phone]) on 12/02/2017 6:44:58 PM   Radiology Dg Chest 2 View  Result Date: 12/02/2017 CLINICAL DATA:  Possible sepsis, fever, port placed last week, history of myeloma, CHF, stage III chronic kidney disease, hypertension, type II diabetes mellitus EXAM: CHEST - 2 VIEW COMPARISON:  11/17/2017 FINDINGS: RIGHT jugular central venous catheter with tip projecting over SVC. Enlargement of cardiac silhouette. Mediastinal contours and pulmonary vascularity normal. Atherosclerotic calcification aorta. LEFT lower lobe consolidation and small LEFT pleural effusion. RIGHT lung clear. No pneumothorax or acute osseous findings. IMPRESSION: LEFT lower lobe consolidation and small LEFT pleural effusion. Electronically Signed   By: Lavonia Dana M.D.   On: 12/02/2017 19:20    Procedures .Critical Care Performed by: Lennice Sites, DO Authorized by: Lennice Sites, DO   Critical care provider statement:    Critical care time (minutes):  45   Critical care time was exclusive of:  Separately billable procedures and treating other patients and teaching time   Critical care was necessary to treat or prevent imminent or life-threatening deterioration of the following conditions:  Sepsis   Critical care was time spent personally by me on the following activities:  Development of treatment plan with patient or surrogate, discussions with primary provider, evaluation of patient's response to treatment, examination of patient, ordering and performing treatments and interventions, ordering and review of laboratory studies, ordering and review of radiographic studies, pulse oximetry, re-evaluation of patient's condition and review of old charts   I assumed direction of critical care for this patient from another provider in my specialty: no      (including critical care time)  Medications Ordered in ED Medications  vancomycin (VANCOCIN) IVPB 750 mg/150 ml premix (750 mg Intravenous New Bag/Given 12/02/17 2140)  ceFEPIme (MAXIPIME) 2 g in sodium chloride 0.9 % 100 mL IVPB (has no administration in time range)  vancomycin (VANCOCIN) IVPB 750 mg/150 ml premix (has no administration in time range)  apixaban (ELIQUIS) tablet 2.5 mg (has no administration in time range)  amiodarone (PACERONE) tablet 200 mg (has no administration in time range)  dronabinol (MARINOL) capsule 2.5 mg (has no administration in time range)  gabapentin (NEURONTIN) tablet 800 mg (has no administration in time range)  hydrocortisone (ANUSOL-HC) suppository 25 mg (has no administration in time range)  magnesium oxide (MAG-OX) tablet 400 mg (has no administration in time range)  Melatonin TABS 5 mg (has no administration in time range)  pantoprazole (PROTONIX) EC tablet 80 mg (has no administration in time range)  ondansetron (ZOFRAN) tablet 4 mg (has no administration in time range)  polyethylene glycol (MIRALAX / GLYCOLAX) packet 17 g (has no administration in time range)  sertraline (ZOLOFT) tablet 25 mg (has no  administration in time range)  senna-docusate (Senokot-S) tablet 2 tablet (has no administration in time range)  rosuvastatin (CRESTOR) tablet 10 mg (has no administration in time range)  Tafamidis Meglumine (Cardiac) CAPS 80 mg (80 mg Oral Given 12/02/17 2133)  zolpidem (AMBIEN) tablet 5 mg (has no administration in time range)  traZODone (DESYREL) tablet 50 mg (has no administration in time range)  insulin aspart (novoLOG) injection 0-9 Units (has no administration in time range)  potassium chloride SA (K-DUR,KLOR-CON) CR tablet 40 mEq (has no administration in time range)  milrinone (PRIMACOR) 20 MG/100 ML (0.2 mg/mL) infusion (has no administration in time range)  ceFEPIme (MAXIPIME) 2 g in sodium chloride 0.9 % 100 mL IVPB (0 g Intravenous  Stopped 12/02/17 2013)  vancomycin (VANCOCIN) IVPB 1000 mg/200 mL premix (0 mg Intravenous Stopped 12/02/17 2139)     Initial Impression / Assessment and Plan / ED Course  I have reviewed the triage vital signs and the nursing notes.  Pertinent labs & imaging results that were available during my care of the patient were reviewed by me and considered in my medical decision making (see chart for details).     MARDY LUCIER is a 77 year old male with history of heart disease, heart failure who presents to the ED with fever.  Patient with fever upon arrival and tachypnea but otherwise unremarkable vitals.  Patient currently on milrinone drip via PICC line given extensive heart failure history.  EF of 10%.  Patient followed up with cardiology today after having a PICC line placed last week and was found to have a fever and increased work of breathing.  Patient overall well-appearing.  He denies any cough, sputum production.  He denies any urinary symptoms, no abdominal pain.  Patient had sepsis work-up initiated given fever and tachypnea and was found to have likely pneumonia and was empirically started on IV vancomycin and IV cefepime.  PICC line without any signs of surrounding infection.  Urinalysis showed no sign of infection.  Lactic acid within normal limits.  Patient with otherwise stable creatinine, no significant electrolyte abnormalities.  Showed no leukocytosis.  EKG showed atrial flutter with no ischemic changes.  EKG is unchanged from prior.  History and physical is not consistent with meningitis.  Given patient with PICC line concern for occult bacteremia as well.  Patient remained hemodynamically stable throughout my care and was admitted to Dr. Alcario Drought with medicine in stable condition.  Final Clinical Impressions(s) / ED Diagnoses   Final diagnoses:  HAP (hospital-acquired pneumonia)    ED Discharge Orders    None       Lennice Sites, DO 12/02/17 2145    Lennice Sites,  DO 12/16/17 1125

## 2017-12-02 NOTE — ED Notes (Signed)
ED Provider at bedside. 

## 2017-12-02 NOTE — H&P (Signed)
History and Physical    ABDULRAHMAN BRACEY ZYY:[id number] DOB: Feb 19, 1941 DOA: 12/02/2017  PCP: [name], MD  Patient coming from: Home  I have personally briefly reviewed patient's old medical records in [place]  Chief Complaint: Fever, vomiting  HPI: LAIDEN MILLES is a 77 y.o. male with medical history significant of chronic combined CHF with TTR amyloidosis, EF 25-30% 06/2017, multiple myeloma, P A flutter, CKD stage 3, NASH with esophageal varices, DM2.  Patient was recently admitted to our service for some 21 days or so, discharged on 8/15.  See DC summary for details, but briefly, patient presented with acute on chronic CHF, TEE showed EF 10%.  Patient put on milrinone, became septic, milrinone discontinued with line holiday, on cefepime and vanc.  Cultures never grew anything, new line put in 8/14 and milrinone resumed 8/15.  Stay was complicated by Paroxysmal A.Flutter, put on amiodarone and eliquis.  Stay was also complicated by rectal bleeding, eliquis was held and resumed on 8/17.  (No bleeding since resuming eliquis).  Pal care was consulted, patient and family have agreed to DNI/DNR but want full scope of treatment otherwise.  Today patient had been feeling well, ate eggs for breakfast.  Then developed fever, N/V x1 episode.  Presents to ED.  Felt much better after vomiting.  SOB is at baseline he says.  No cough, sputum production, urinary symptoms.   ED Course: Tm 100.8, BP 100/58, creat baseline at 1.5.  CXR shows LLL consolidation.  Patient put on cefepime and vanc.   Review of Systems: As per HPI otherwise 10 point review of systems negative.   Past Medical History:  Diagnosis Date  . Anemia 11/2016  . Arthritis    "some; hands/fingers" (06/25/2017)  . Atrial tachycardia ([place])   . Barrett's esophagus   . Basal cell carcinoma    "face, arms, hands" (06/25/2017)  . Chicken pox   . Cholelithiasis 11/2016   S/P LAP CHOLE  . Chronic  combined systolic and diastolic CHF (congestive heart failure) ([place])   . CKD (chronic kidney disease), stage III ([place])   . Esophageal varices (HCC)    Secondary to NAFLD  . Family history of adverse reaction to anesthesia    "daughter's sat drop very low; last OR had to be reintubated" (06/25/2017)  . GERD (gastroesophageal reflux disease)   . Goals of care, counseling/discussion 10/22/2017  . High cholesterol   . Hypertension   . Kappa light chain myeloma ([place]) 10/22/2017  . Mitral regurgitation   . NAFLD (nonalcoholic fatty liver disease)   . NICM (nonischemic cardiomyopathy) ([place])    Echo 06/2017: Severe LVH.  EF 25 to 30% with mild HK of the baseline after septal wall.  Moderate to severely dilated right atrium, moderately dilated RV. (Concern fr ? amyloidosis).  . NSVT (nonsustained ventricular tachycardia) ([place])   . Pneumonia 09/2016  . RBBB   . Tricuspid regurgitation   . Type II diabetes mellitus ([place])     Past Surgical History:  Procedure Laterality Date  . BACK SURGERY    . BASAL CELL CARCINOMA EXCISION Right    temple; "had to do skin graft"  . CARDIAC CATHETERIZATION  09/2016   Minimal, non-occlusive CAD, EF 45% [facility])  . CARDIOVERSION N/A 11/10/2017   Procedure: CARDIOVERSION;  Surgeon: Jolaine Artist, MD;  Location[facility] ENDOSCOPY;  Service: Cardiovascular;  Laterality: N/A;  . CARDIOVERSION N/A 11/14/2017   Procedure: CARDIOVERSION;  Surgeon: Jolaine Artist, MD;  Location: [facility] ENDOSCOPY;  Service: Cardiovascular;  Laterality: N/A;  . CARPAL TUNNEL RELEASE Bilateral   . CATARACT EXTRACTION W/ INTRAOCULAR LENS  IMPLANT, BILATERAL Bilateral   . CHOLECYSTECTOMY N/A 11/22/2016   Procedure: LAPAROSCOPIC CHOLECYSTECTOMY;  Surgeon: Coralie Keens, MD;  Location: [place];  Service: General;  Laterality: N/A;  . IR FLUORO GUIDE CV LINE RIGHT  11/26/2017  . IR US GUIDE VASC ACCESS RIGHT  11/26/2017  . JOINT REPLACEMENT    . LUMBAR DISC SURGERY  X 2  . TEE  WITHOUT CARDIOVERSION N/A 11/10/2017   Procedure: TRANSESOPHAGEAL ECHOCARDIOGRAM (TEE);  Surgeon: Jolaine Artist, MD;  Location: [facility] ENDOSCOPY;  Service: Cardiovascular;  Laterality: N/A;  . TONSILLECTOMY AND ADENOIDECTOMY    . TOTAL KNEE ARTHROPLASTY Left 2010  . TRANSESOPHAGEAL ECHOCARDIOGRAM  09/2016   Mild global LV hypokinesis. Moderate MR. Moderate TR. [facility])  . TRANSTHORACIC ECHOCARDIOGRAM  10/2016   [facility]) EF 35-40% with moderate HK. Normal RV function. Moderately severe TR.  Marland Kitchen TRANSTHORACIC ECHOCARDIOGRAM  03/2017; 06/2017    A) ([place]) mild concentric LVH. EF of 40-45%. Diffuse HK with no RWMA. GR 2 DD. Septal dyssynergy. Mild MR, moderate TR. Severe RA dilation..;; B) Severe LVH.  EF 25 to 30% with mild HK of Septal wall.  Mod-severely dilated RA & Mod dilated RV.     reports that he quit smoking about 40 years ago. His smoking use included cigarettes. He has a 4.50 pack-year smoking history. He has never used smokeless tobacco. He reports that he drinks alcohol. He reports that he does not use drugs.  No Known Allergies  Family History  Problem Relation Age of Onset  . Heart failure Mother   . Cancer Father   . Cataracts Father   . Breast cancer Sister   . Alcohol abuse Brother   . Liver disease Brother   . Autoimmune disease Daughter        Bechet's syndrome  . Cirrhosis Neg Hx      Prior to Admission medications   Medication Sig Start Date End Date Taking? Authorizing Provider  VYNDAQEL 20 MG CAPS Take 80 mg by mouth daily. (4 capsules) 11/27/17  Yes Tillery, Satira Mccallum, PA-C  amiodarone (PACERONE) 200 MG tablet Take 1 tablet (200 mg total) by mouth 2 (two) times daily. 11/27/17   Shirley Friar, PA-C  apixaban (ELIQUIS) 2.5 MG TABS tablet Take 1 tablet (2.5 mg total) by mouth 2 (two) times daily. 11/29/17   Nita Sells, MD  dronabinol (MARINOL) 2.5 MG capsule Take 1 capsule (2.5 mg total) by mouth 2 (two) times  daily before lunch and supper. 10/22/17   Volanda Napoleon, MD  gabapentin (NEURONTIN) 800 MG tablet TAKE 1 TABLET BY MOUTH AT BEDTIME 09/23/17   [name], MD  glucose blood test strip Use as instructed 12/01/17   [name], MD  hydrocortisone (ANUSOL-HC) 25 MG suppository Place 1 suppository (25 mg total) rectally 2 (two) times daily. 11/27/17   Nita Sells, MD  magnesium oxide (MAG-OX) 400 MG tablet Take 400 mg by mouth daily.    [provider]  Melatonin 5 MG TABS Take 5 mg by mouth at bedtime.     [provider]  milrinone (PRIMACOR) 20 MG/100 ML SOLN infusion Inject 0.0099 mg/min into the vein continuous. 11/27/17   Shirley Friar, PA-C  omeprazole (PRILOSEC) 40 MG capsule Take 1 capsule (40 mg total) by mouth daily. 07/31/17   [name],  MD  ondansetron (ZOFRAN) 4 MG tablet Take 1 tablet (4 mg total) by mouth every 8 (eight) hours as needed for nausea or vomiting. 10/08/17   [name], MD  polyethylene glycol [facility] Floria Raveling) packet Take 17 g by mouth daily. 11/28/17   Nita Sells, MD  potassium chloride SA (K-DUR,KLOR-CON) 20 MEQ tablet Take 1 tablet (20 mEq total) by mouth daily. 11/27/17   Shirley Friar, PA-C  Probiotic Product (PROBIOTIC DAILY PO) Take 1 tablet by mouth.    [provider]  rosuvastatin (CRESTOR) 10 MG tablet TAKE 1 TABLET BY MOUTH ONCE DAILY 10/29/17   [name], MD  senna-docusate (SENOKOT-S) 8.6-50 MG tablet Take 2 tablets by mouth 2 (two) times daily. 11/27/17   Nita Sells, MD  sertraline (ZOLOFT) 25 MG tablet Take 1 tablet (25 mg total) by mouth daily. 11/28/17   Nita Sells, MD  torsemide (DEMADEX) 20 MG tablet Take 1 tablet (20 mg total) by mouth daily. Can take 20 mg extra as needed. 11/27/17   Shirley Friar, PA-C  traZODone (DESYREL) 50 MG tablet TAKE 1 TABLET(50 MG) BY MOUTH AT BEDTIME 10/29/17   [name], MD  vitamin B-12 (CYANOCOBALAMIN) 500 MCG tablet Take 500 mcg by mouth every other day.    [provider]  zolpidem (AMBIEN) 5 MG tablet TAKE 1 TABLET BY MOUTH EVERY NIGHT AT BEDTIME AS NEEDED FOR SLEEP 10/23/17   [name], MD    Physical Exam: Vitals:   12/02/17 1845 12/02/17 1930 12/02/17 1945 12/02/17 2015  BP: 102/62 (!) 103/52 (!) 99/58 97/60  Pulse: 86 84 82 83  Resp: (!) 33 (!) 28 (!) 24 (!) 21  Temp:      TempSrc:      SpO2:  99%  99%  Weight:      Height:        Constitutional: NAD, calm, comfortable Eyes: PERRL, lids and conjunctivae normal ENMT: Mucous membranes are moist. Posterior pharynx clear of any exudate or lesions.Normal dentition.  Neck: normal, supple, no masses, no thyromegaly Respiratory: clear to auscultation bilaterally, no wheezing, no crackles. Normal respiratory effort. No accessory muscle use.  Cardiovascular: Regular rate and rhythm, no murmurs / rubs / gallops. No extremity edema. 2+ pedal pulses. No carotid bruits.  Abdomen: no tenderness, no masses palpated. No hepatosplenomegaly. Bowel sounds positive.  Musculoskeletal: no clubbing / cyanosis. No joint deformity upper and lower extremities. Good ROM, no contractures. Normal muscle tone.  Skin: No obvious stigmata of infection about the IJ line. Neurologic: CN 2-12 grossly intact. Sensation intact, DTR normal. Strength 5/5 in all 4.  Psychiatric: Normal judgment and insight. Alert and oriented x 3. Normal mood.    Labs on Admission: I have personally reviewed following labs and imaging studies  CBC: Recent Labs  Lab 11/26/17 0210 11/26/17 1429 11/27/17 0300 12/02/17 1850  WBC 5.3 5.6 5.6 8.8  NEUTROABS  --   --   --  7.0  HGB 8.3* 9.8* 9.1* 9.9*  HCT 26.8* 31.4* 29.7* 31.7*  MCV 90.2 91.5 92.2 91.6  PLT 151 179 178 409   Basic Metabolic Panel: Recent Labs  Lab 11/26/17 0210 11/27/17 0300 12/02/17 1850  NA 138 140 140  K 3.7 3.7 3.1*  CL 96* 96* 96*    CO2 34* 33* 32  GLUCOSE 122* 119* 137*  BUN 17 15 24*  CREATININE 1.36* 1.32* 1.51*  CALCIUM 8.5* 8.4* 8.1*   GFR: Estimated Creatinine Clearance: 40.3 mL/min (  A) (by C-G formula based on SCr of 1.51 mg/dL (H)). Liver Function Tests: Recent Labs  Lab 11/26/17 0210 12/02/17 1850  AST 30 35  ALT 26 25  ALKPHOS 160* 140*  BILITOT 0.8 1.0  PROT 5.4* 6.0*  ALBUMIN 2.6* 2.8*   No results for input(s): LIPASE, AMYLASE in the last 168 hours. No results for input(s): AMMONIA in the last 168 hours. Coagulation Profile: Recent Labs  Lab 12/02/17 1850  INR 1.34   Cardiac Enzymes: No results for input(s): CKTOTAL, CKMB, CKMBINDEX, TROPONINI in the last 168 hours. BNP (last 3 results) No results for input(s): PROBNP in the last 8760 hours. HbA1C: No results for input(s): HGBA1C in the last 72 hours. CBG: Recent Labs  Lab 11/26/17 1612 11/26/17 1740 11/26/17 2116 11/27/17 0806 11/27/17 1134  GLUCAP 139* 159* 115* 122* 175*   Lipid Profile: No results for input(s): CHOL, HDL, LDLCALC, TRIG, CHOLHDL, LDLDIRECT in the last 72 hours. Thyroid Function Tests: No results for input(s): TSH, T4TOTAL, FREET4, T3FREE, THYROIDAB in the last 72 hours. Anemia Panel: No results for input(s): VITAMINB12, FOLATE, FERRITIN, TIBC, IRON, RETICCTPCT in the last 72 hours. Urine analysis:    Component Value Date/Time   COLORURINE YELLOW 12/02/2017 1935   APPEARANCEUR CLEAR 12/02/2017 1935   LABSPEC 1.006 12/02/2017 1935   PHURINE 7.0 12/02/2017 1935   GLUCOSEU NEGATIVE 12/02/2017 1935   HGBUR NEGATIVE 12/02/2017 1935   BILIRUBINUR NEGATIVE 12/02/2017 1935   KETONESUR NEGATIVE 12/02/2017 1935   PROTEINUR NEGATIVE 12/02/2017 1935   UROBILINOGEN 1.0 06/06/2008 0949   NITRITE NEGATIVE 12/02/2017 1935   LEUKOCYTESUR NEGATIVE 12/02/2017 1935    Radiological Exams on Admission: Dg Chest 2 View  Result Date: 12/02/2017 CLINICAL DATA:  Possible sepsis, fever, port placed last week, history  of myeloma, CHF, stage III chronic kidney disease, hypertension, type II diabetes mellitus EXAM: CHEST - 2 VIEW COMPARISON:  11/17/2017 FINDINGS: RIGHT jugular central venous catheter with tip projecting over SVC. Enlargement of cardiac silhouette. Mediastinal contours and pulmonary vascularity normal. Atherosclerotic calcification aorta. LEFT lower lobe consolidation and small LEFT pleural effusion. RIGHT lung clear. No pneumothorax or acute osseous findings. IMPRESSION: LEFT lower lobe consolidation and small LEFT pleural effusion. Electronically Signed   By: Lavonia Dana M.D.   On: 12/02/2017 19:20    EKG: Independently reviewed.  Assessment/Plan Principal Problem:   HCAP (healthcare-associated pneumonia) Active Problems:   CKD (chronic kidney disease) stage 3, GFR 30-59 ml/min (HCC), baseline Cr 1.5   DM2 (diabetes mellitus, type 2) (HCC)   Bilateral pleural effusion   Esophageal varices in cirrhosis (HCC)   Cardiac amyloidosis (HCC)   Kappa light chain myeloma (HCC)   Chronic combined systolic and diastolic CHF (congestive heart failure) (HCC)   Paroxysmal atrial flutter ([place])    1. HCAP - 1. Apparent HCAP with fever, LLL consolidation. 2. PNA pathway 3. Cultures pending, including repeat BCx given presence of line, no obvious stigmata of infection around line though 4. Cefepime and vanc empirically 2. Chronic combined CHF due to amyloid - 1. Continue Vyndaquel 2. Will 'gently hydrate' patient given N/V by holding torsemide tomorrow. 3. CKD stage 3 - 1. Currently at baseline 2. Repeat BMP in AM 4. PAF - 1. NSR on amiodarone 2. Tele monitor 3. Continue eliquis 4. Repeat CBC in AM but no stigmata of GIB at this time 5. DM2 - 1. Currently diet controlled only it appears 2. Will put on sensitive SSI AC while here  DVT prophylaxis: Eliquis Code Status: DNI/DNR -  but want full scope of medical treatment at this point otherwise Family Communication: No family in  room Disposition Plan: Home after admit Consults called: None Admission status: Admit to inpatient   Etta Quill DO [facility] Pager [phone] Only works nights!  If 7AM-7PM, please contact the primary day team physician taking care of patient  [email or web address] Password [facility]  12/02/2017, 8:49 PM

## 2017-12-02 NOTE — Progress Notes (Signed)
Pharmacy Antibiotic Note  Dillon Andrews is a 77 y.o. male admitted on 12/02/2017 with sepsis.  Pharmacy has been consulted for Cefepime and Vancomycin dosing.  Plan: Vancomycin 1017m + [id number]; Loading dose.  Vancomycin 75022mV every 12 hours.  Goal trough 15-20 mcg/mL.  Cefepime 2g IV x1 Loading dose. Cefepime 1g IV every 24 hours.    Height: 5' 9"  (175.3 cm) Weight: 174 lb 9.7 oz (79.2 kg) IBW/kg (Calculated) : 70.7  Temp (24hrs), Avg:100 F (37.8 C), Min:99.1 F (37.3 C), Max:100.8 F (38.2 C)  Recent Labs  Lab 11/26/17 0210 11/26/17 1429 11/27/17 0300  WBC 5.3 5.6 5.6  CREATININE 1.36*  --  1.32*    Estimated Creatinine Clearance: 46.1 mL/min (A) (by C-G formula based on SCr of 1.32 mg/dL (H)).    No Known Allergies  Antimicrobials this admission: 8/20 Vancomycin >>  8/20 Cefepime >>   Dose adjustments this admission:   Microbiology results: 8/20 BCx: SENT  Thank you for allowing pharmacy to be a part of this patient's care.  JosTamela GammonharmD PGY1 Pharmacy Resident Please check AMION for current pharmacy phone numbers. 12/02/2017 7:02 PM

## 2017-12-02 NOTE — ED Notes (Signed)
Family took possession of pt milrinone at home infusion

## 2017-12-02 NOTE — Telephone Encounter (Addendum)
Pt's daughter called concerned about pt, she states her mom called her and said pt has been nauseous since about 2 pm and vomited just a little bit, she stated he has not eaten anything or drank anything since then.  She states his temp is 99.9 and he was having some chills.  She states she is not home right now she is unable to assess him her self at this time and can not provide any more info.  She does report she discovered today he has been taking 4 potassium tabs so she has stopped that and told him to only take 1 a day and take an extra when he takes an extra Torsemide, which she states he has been taking everyday due to some slight swelling in feet/ankles.  Pt has zofran at home but she is not sure if he has taken it or not.  Advised daughter to assess pt when she gets him, if he is unstable or she is concerned she can either take him to ER or call back and speak w/on call MD, if he seems stable advised he should take Zofran and try to at least drink a little something and I will touch base with her in the AM.  Advised pt did have labs done by [facility] yesterday and we received results today:  hgb 10.1 WBC 8.7 K 3.8 Cr 1.62 BUN 22 Mag 1.8 Glucose 38 Note states**Specimen received in contact with cells.  No visible hemolysis present.  However GLUC may be decreased and K increased.  Clinical correlation indicated**veridied by repeat analysis**  Daughter states she will check his glucose when she gets there as well.

## 2017-12-02 NOTE — ED Triage Notes (Signed)
Pt from home with ems for fever. Pt reports his wife took his temperature and it was 99.5 and 101.5 with ems. Pt was recently admitted for sepsis. Pt has a port for milrinone drip. Pt reports no issues with infusion but cardiologist suggested pt come here for possible infection at site of port. No redness or swelling noted at site of catheter. Pt has no complaints.   BP 93/62 HR 85 95% on room air, rr30 CBG 187  EMS gave 135m saline en route.

## 2017-12-02 NOTE — ED Notes (Signed)
12 tablets of tafamidis meglumine delivered to pharmacy. Family/pt witnessed.

## 2017-12-02 NOTE — ED Notes (Signed)
Pharmacy messaged about unverified meds 

## 2017-12-03 ENCOUNTER — Other Ambulatory Visit: Payer: Self-pay

## 2017-12-03 ENCOUNTER — Encounter (HOSPITAL_COMMUNITY): Payer: Medicare HMO

## 2017-12-03 ENCOUNTER — Other Ambulatory Visit (HOSPITAL_COMMUNITY): Payer: Self-pay | Admitting: Internal Medicine

## 2017-12-03 ENCOUNTER — Encounter (HOSPITAL_COMMUNITY): Payer: Self-pay

## 2017-12-03 DIAGNOSIS — I4892 Unspecified atrial flutter: Secondary | ICD-10-CM

## 2017-12-03 DIAGNOSIS — I43 Cardiomyopathy in diseases classified elsewhere: Secondary | ICD-10-CM

## 2017-12-03 DIAGNOSIS — E854 Organ-limited amyloidosis: Secondary | ICD-10-CM

## 2017-12-03 DIAGNOSIS — J189 Pneumonia, unspecified organism: Principal | ICD-10-CM

## 2017-12-03 DIAGNOSIS — I5042 Chronic combined systolic (congestive) and diastolic (congestive) heart failure: Secondary | ICD-10-CM

## 2017-12-03 DIAGNOSIS — N183 Chronic kidney disease, stage 3 (moderate): Secondary | ICD-10-CM

## 2017-12-03 LAB — BASIC METABOLIC PANEL
Anion gap: 9 (ref 5–15)
Anion gap: 9 (ref 5–15)
BUN: 19 mg/dL (ref 8–23)
BUN: 21 mg/dL (ref 8–23)
CALCIUM: 8 mg/dL — AB (ref 8.9–10.3)
CALCIUM: 8.1 mg/dL — AB (ref 8.9–10.3)
CO2: 34 mmol/L — ABNORMAL HIGH (ref 22–32)
CO2: 32 mmol/L (ref 22–32)
CREATININE: 1.47 mg/dL — AB (ref 0.61–1.24)
Chloride: 98 mmol/L (ref 98–111)
Chloride: 99 mmol/L (ref 98–111)
Creatinine, Ser: 1.44 mg/dL — ABNORMAL HIGH (ref 0.61–1.24)
GFR calc Af Amer: 52 mL/min — ABNORMAL LOW (ref >60)
GFR calc non Af Amer: 44 mL/min — ABNORMAL LOW (ref >60)
GFR, EST AFRICAN AMERICAN: 51 mL/min — AB (ref >60)
GFR, EST NON AFRICAN AMERICAN: 45 mL/min — AB (ref >60)
Glucose, Bld: 108 mg/dL — ABNORMAL HIGH (ref 70–99)
Glucose, Bld: 118 mg/dL — ABNORMAL HIGH (ref 70–99)
POTASSIUM: 2.9 mmol/L — AB (ref 3.5–5.1)
Potassium: 3.8 mmol/L (ref 3.5–5.1)
SODIUM: 141 mmol/L (ref 135–145)
SODIUM: 140 mmol/L (ref 135–145)

## 2017-12-03 LAB — PROCALCITONIN: Procalcitonin: <0.1 ng/mL

## 2017-12-03 LAB — GLUCOSE, CAPILLARY
GLUCOSE-CAPILLARY: 150 mg/dL — AB (ref 70–99)
GLUCOSE-CAPILLARY: 107 mg/dL — AB (ref 70–99)
Glucose-Capillary: 118 mg/dL — ABNORMAL HIGH (ref 70–99)
Glucose-Capillary: 126 mg/dL — ABNORMAL HIGH (ref 70–99)

## 2017-12-03 LAB — CBC
HCT: 29.3 % — ABNORMAL LOW (ref 39.0–52.0)
Hemoglobin: 9.1 g/dL — ABNORMAL LOW (ref 13.0–17.0)
MCH: 28.2 pg (ref 26.0–34.0)
MCHC: 31.1 g/dL (ref 30.0–36.0)
MCV: 90.7 fL (ref 78.0–100.0)
PLATELETS: 183 10*3/uL (ref 150–400)
RBC: 3.23 MIL/uL — AB (ref 4.22–5.81)
RDW: 19 % — AB (ref 11.5–15.5)
WBC: 7.3 10*3/uL (ref 4.0–10.5)

## 2017-12-03 LAB — COOXEMETRY PANEL
Carboxyhemoglobin: 1.8 % — ABNORMAL HIGH (ref 0.5–1.5)
Methemoglobin: 0.8 % (ref 0.0–1.5)
O2 Saturation: 54.9 %
Total hemoglobin: 9.4 g/dL — ABNORMAL LOW (ref 12.0–16.0)

## 2017-12-03 LAB — HIV ANTIBODY (ROUTINE TESTING W REFLEX): HIV Screen 4th Generation wRfx: NONREACTIVE

## 2017-12-03 MED ORDER — POTASSIUM CHLORIDE CRYS ER 20 MEQ PO TBCR
40.0000 meq | EXTENDED_RELEASE_TABLET | ORAL | Status: AC
  Administered 2017-12-03 (×3): 40 meq via ORAL
  Filled 2017-12-03 (×3): qty 2

## 2017-12-03 MED ORDER — SODIUM CHLORIDE 0.9 % IV SOLN
INTRAVENOUS | Status: DC | PRN
  Administered 2017-12-03: 500 mL via INTRAVENOUS

## 2017-12-03 MED ORDER — GABAPENTIN 400 MG PO CAPS
800.0000 mg | ORAL_CAPSULE | Freq: Every day | ORAL | Status: DC
  Administered 2017-12-03 (×2): 800 mg via ORAL
  Filled 2017-12-03 (×2): qty 2

## 2017-12-03 NOTE — Consult Note (Addendum)
Advanced Heart Failure Team Consult Note   Primary Physician: [name], MD PCP-Cardiologist:  Glenetta Hew, MD  Reason for Consultation: Fever and vomiting  HPI:    STANLEE ROEHRIG is seen today for evaluation of fever and vomiting at the request of Dr Algis Liming.   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 He is now on home milrinone and followed by [facility].  Recently admitted 4/76-5/46/50 with A/C systolic HF. He was diuresed with IV lasix. He required milrinone for low output. He was in afib RVR and was cardioverted twice. He was treated for sepsis. He had AKI on CKD that resolved prior to discharge. He also had rectal bleeding and required 1 unit RBCs. Eliquis was held and then dose was reduced. Palliative met with him and he was made DNR/I. Discharged home with milrinone.   He has been doing well since discharge. He feels that he is getting stronger. He is SOB with exertion, but it is stable. Denies any further bleeding. Chronic orthopnea. Minimal BLE edema. Weights have been stable 170-172 lbs at home. He felt fatigued after breakfast yesterday. He then had an episode of vomiting and felt better. He also had chills. No cough. He presented to [facility] for further evaluation.  Pertinent admission labs include: Na 140, K 3.1, creatinine 1.51, BNP 350 (393-515 in past), lactic acid 1.49, WBC 8.8, hemoglobin 9.9, blood cultures pending, UA negative. CXR: LLL consolidation and left pleural effusion  EKG shows atrial flutter 88 bpm.   Temp 100.8 on admit. He has been treated with Cefepime and Vanc for HCAP.   Coox 55% this am on home milrinone 0.125 mcg/kg/min. K 2.9, creatinine stable 1.44,  hemoglobin 9.1. Now afebrile.   He feels fine this morning. Has been walking in hallways with no problems. SOB is at baseline. He cannot tell that he is back in Afib. He held [employer] last week as ordered. No further bleeding.   Echo 06/2017 - Left ventricle: The cavity size was normal. Wall thickness was   increased in a pattern of severe LVH. Systolic function was   severely reduced. The estimated ejection fraction was in the   range of 25% to 30%. Mild hypokinesis of the basalanteroseptal   myocardium. - Aortic valve: Mildly calcified annulus. Trileaflet; normal   thickness, mildly calcified leaflets. - Left atrium: The atrium was mildly dilated. - Right ventricle: The cavity size was mildly to moderately   dilated. Wall thickness was normal. - Right atrium: The atrium was moderately to severely dilated.  Review of Systems: [y] = yes, [ ]  = no   General: Weight gain [ ] ; Weight loss [ ] ; Anorexia [ ] ; Fatigue Blue.Reese ]; Fever [ y]; Chills [ y]; Weakness [ ]   Cardiac: Chest pain/pressure [ ] ; Resting SOB [ ] ; Exertional SOB [ y]; Orthopnea Blue.Reese ]; Pedal Edema [ ] ; Palpitations [ ] ; Syncope [ ] ; Presyncope [ ] ; Paroxysmal nocturnal dyspnea[ ]   Pulmonary: Cough [ ] ; Wheezing[ ] ; Hemoptysis[ ] ; Sputum [ ] ; Snoring [ ]   GI: Vomiting[y ]; Dysphagia[ ] ; Melena[ ] ; Hematochezia [ ] ; Heartburn[ ] ; Abdominal pain [ ] ; Constipation [ ] ; Diarrhea [ ] ; BRBPR [ ]   GU: Hematuria[ ] ; Dysuria [ ] ; Nocturia[ ]   Vascular: Pain  in legs with walking [ ] ; Pain in feet with lying flat [ ] ; Non-healing sores [ ] ; Stroke [ ] ; TIA [ ] ; Slurred speech [ ] ;  Neuro: Headaches[ ] ; Vertigo[ ] ; Seizures[ ] ; Paresthesias[ ] ;Blurred vision [ ] ; Diplopia [ ] ; Vision changes [ ]   Ortho/Skin: Arthritis [ ] ; Joint pain [ ] ; Muscle pain [ ] ; Joint swelling [ ] ; Back Pain [ ] ; Rash [ ]   Psych: Depression[ ] ; Anxiety[ ]   Heme: Bleeding problems [ y]; Clotting disorders [ ] ; Anemia [ ]   Endocrine: Diabetes [ ] ;  Thyroid dysfunction[ ]   Home Medications Prior to Admission medications   Medication Sig Start Date End Date Taking? Authorizing Provider  amiodarone (PACERONE) 200 MG tablet Take 1 tablet (200 mg total) by mouth 2 (two) times daily. 11/27/17  Yes Shirley Friar, PA-C  apixaban (ELIQUIS) 2.5 MG TABS tablet Take 1 tablet (2.5 mg total) by mouth 2 (two) times daily. 11/29/17  Yes Nita Sells, MD  dronabinol (MARINOL) 2.5 MG capsule Take 1 capsule (2.5 mg total) by mouth 2 (two) times daily before lunch and supper. 10/22/17  Yes Ennever, Rudell Cobb, MD  gabapentin (NEURONTIN) 800 MG tablet TAKE 1 TABLET BY MOUTH AT BEDTIME Patient taking differently: Take 800 mg by mouth at bedtime.  09/23/17  Yes [name], MD  glimepiride (AMARYL) 1 MG tablet Take 1 mg by mouth daily. 11/27/17  Yes [provider]  hydrocortisone (ANUSOL-HC) 25 MG suppository Place 1 suppository (25 mg total) rectally 2 (two) times daily. Patient taking differently: Place 25 mg rectally 2 (two) times daily as needed for hemorrhoids.  11/27/17  Yes Nita Sells, MD  magnesium oxide (MAG-OX) 400 MG tablet Take 400 mg by mouth daily.   Yes [provider]  Melatonin 5 MG TABS Take 5 mg by mouth at bedtime.    Yes [provider]  milrinone (PRIMACOR) 20 MG/100 ML SOLN infusion Inject 0.0099 mg/min into the vein continuous. 11/27/17  Yes Shirley Friar, PA-C  omeprazole (PRILOSEC) 40 MG capsule Take 1 capsule (40 mg total) by mouth daily. 07/31/17  Yes [name], MD  ondansetron (ZOFRAN) 4 MG tablet Take 1 tablet (4 mg total) by mouth every 8 (eight) hours as needed for nausea or vomiting. 10/08/17  Yes [name], MD  polyethylene glycol [facility] / Floria Raveling) packet Take 17 g by mouth daily. Patient taking differently: Take 17 g by mouth daily as needed (constipation). Mix with 8 oz orange juice and drink 11/28/17  Yes Samtani, Darylene Price, MD    potassium chloride SA (K-DUR,KLOR-CON) 20 MEQ tablet Take 1 tablet (20 mEq total) by mouth daily. Patient taking differently: Take 20 mEq by mouth See admin instructions. Take one tablet (20 meq) by mouth daily, may also take one tablet with every extra dose of torsemide. 11/27/17  Yes Shirley Friar, PA-C  Probiotic Product (PROBIOTIC DAILY PO) Take 1 tablet by mouth daily.    Yes [provider]  rosuvastatin (CRESTOR) 10 MG tablet TAKE 1 TABLET BY MOUTH ONCE DAILY Patient taking differently: Take 10 mg by mouth daily.  10/29/17  Yes [name], MD  senna-docusate (SENOKOT-S) 8.6-50 MG tablet Take 2 tablets by mouth 2 (two) times daily. Patient taking differently: Take 1 tablet by mouth See admin instructions. Take one tablet by mouth every morning, may take another tablet in the evening as needed for constipation 11/27/17  Yes Nita Sells, MD  sertraline (ZOLOFT) 25 MG tablet Take 1  tablet (25 mg total) by mouth daily. 11/28/17  Yes Nita Sells, MD  torsemide (DEMADEX) 20 MG tablet Take 1 tablet (20 mg total) by mouth daily. Can take 20 mg extra as needed. Patient taking differently: Take 20 mg by mouth See admin instructions. Take one tablet (20 mg) by mouth every morning, may also take one tablet in the afternoon as needed for swelling 11/27/17  Yes Shirley Friar, PA-C  traZODone (DESYREL) 50 MG tablet TAKE 1 TABLET(50 MG) BY MOUTH AT BEDTIME Patient taking differently: Take 50 mg by mouth at bedtime.  10/29/17  Yes [name], MD  vitamin B-12 (CYANOCOBALAMIN) 500 MCG tablet Take 500 mcg by mouth every other day.   Yes [provider]  VYNDAQEL 20 MG CAPS Take 80 mg by mouth daily. (4 capsules) Patient taking differently: Take 80 mg by mouth every evening. (4 capsules) 11/27/17  Yes Tillery, Satira Mccallum, PA-C  zolpidem (AMBIEN) 5 MG tablet TAKE 1 TABLET BY MOUTH EVERY NIGHT AT BEDTIME AS NEEDED FOR SLEEP Patient taking  differently: Take 5 mg by mouth at bedtime.  10/23/17  Yes [name], MD  glucose blood test strip Use as instructed 12/01/17   [name], MD    Past Medical History: Past Medical History:  Diagnosis Date  . Anemia 11/2016  . Arthritis    "some; hands/fingers" (06/25/2017)  . Atrial tachycardia ([place])   . Barrett's esophagus   . Basal cell carcinoma    "face, arms, hands" (06/25/2017)  . Chicken pox   . Cholelithiasis 11/2016   S/P LAP CHOLE  . Chronic combined systolic and diastolic CHF (congestive heart failure) ([place])   . CKD (chronic kidney disease), stage III ([place])   . Esophageal varices (HCC)    Secondary to NAFLD  . Family history of adverse reaction to anesthesia    "daughter's sat drop very low; last OR had to be reintubated" (06/25/2017)  . GERD (gastroesophageal reflux disease)   . Goals of care, counseling/discussion 10/22/2017  . High cholesterol   . Hypertension   . Kappa light chain myeloma ([place]) 10/22/2017  . Mitral regurgitation   . NAFLD (nonalcoholic fatty liver disease)   . NICM (nonischemic cardiomyopathy) ([place])    Echo 06/2017: Severe LVH.  EF 25 to 30% with mild HK of the baseline after septal wall.  Moderate to severely dilated right atrium, moderately dilated RV. (Concern fr ? amyloidosis).  . NSVT (nonsustained ventricular tachycardia) ([place])   . Pneumonia 09/2016  . RBBB   . Tricuspid regurgitation   . Type II diabetes mellitus ([place])     Past Surgical History: Past Surgical History:  Procedure Laterality Date  . BACK SURGERY    . BASAL CELL CARCINOMA EXCISION Right    temple; "had to do skin graft"  . CARDIAC CATHETERIZATION  09/2016   Minimal, non-occlusive CAD, EF 45% [facility])  . CARDIOVERSION N/A 11/10/2017   Procedure: CARDIOVERSION;  Surgeon: Jolaine Artist, MD;  Location: [facility] ENDOSCOPY;  Service: Cardiovascular;  Laterality: N/A;  . CARDIOVERSION N/A 11/14/2017   Procedure: CARDIOVERSION;  Surgeon:  Jolaine Artist, MD;  Location: [facility] ENDOSCOPY;  Service: Cardiovascular;  Laterality: N/A;  . CARPAL TUNNEL RELEASE Bilateral   . CATARACT EXTRACTION W/ INTRAOCULAR LENS  IMPLANT, BILATERAL Bilateral   . CHOLECYSTECTOMY N/A 11/22/2016   Procedure: LAPAROSCOPIC CHOLECYSTECTOMY;  Surgeon: Coralie Keens, MD;  Location: [place];  Service: General;  Laterality: N/A;  . IR FLUORO GUIDE CV LINE RIGHT  11/26/2017  . IR US GUIDE VASC ACCESS RIGHT  11/26/2017  . JOINT REPLACEMENT    . LUMBAR DISC SURGERY  X 2  . TEE WITHOUT CARDIOVERSION N/A 11/10/2017   Procedure: TRANSESOPHAGEAL ECHOCARDIOGRAM (TEE);  Surgeon: Jolaine Artist, MD;  Location: [facility] ENDOSCOPY;  Service: Cardiovascular;  Laterality: N/A;  . TONSILLECTOMY AND ADENOIDECTOMY    . TOTAL KNEE ARTHROPLASTY Left 2010  . TRANSESOPHAGEAL ECHOCARDIOGRAM  09/2016   Mild global LV hypokinesis. Moderate MR. Moderate TR. [facility])  . TRANSTHORACIC ECHOCARDIOGRAM  10/2016   [facility]) EF 35-40% with moderate HK. Normal RV function. Moderately severe TR.  Marland Kitchen TRANSTHORACIC ECHOCARDIOGRAM  03/2017; 06/2017    A) ([place]) mild concentric LVH. EF of 40-45%. Diffuse HK with no RWMA. GR 2 DD. Septal dyssynergy. Mild MR, moderate TR. Severe RA dilation..;; B) Severe LVH.  EF 25 to 30% with mild HK of Septal wall.  Mod-severely dilated RA & Mod dilated RV.    Family History: Family History  Problem Relation Age of Onset  . Heart failure Mother   . Cancer Father   . Cataracts Father   . Breast cancer Sister   . Alcohol abuse Brother   . Liver disease Brother   . Autoimmune disease Daughter        Bechet's syndrome  . Cirrhosis Neg Hx     Social History: Social History   Socioeconomic History  . Marital status: Married    Spouse name: Not on file  . Number of children: 4  . Years of education: 72  . Highest education level: Not on file  Occupational History  . Occupation: retired  Scientific laboratory technician  . Financial  resource strain: Not on file  . Food insecurity:    Worry: Not on file    Inability: Not on file  . Transportation needs:    Medical: Not on file    Non-medical: Not on file  Tobacco Use  . Smoking status: Former Smoker    Packs/day: 0.30    Years: 15.00    Pack years: 4.50    Types: Cigarettes    Last attempt to quit: 03/17/1977    Years since quitting: 40.7  . Smokeless tobacco: Never Used  Substance and Sexual Activity  . Alcohol use: Yes    Comment: 06/25/2017 "nothing since 09/2016;  used to have glass of wine q hs"  . Drug use: No  . Sexual activity: Not on file  Lifestyle  . Physical activity:    Days per week: Not on file    Minutes per session: Not on file  . Stress: Not on file  Relationships  . Social connections:    Talks on phone: Not on file    Gets together: Not on file    Attends religious service: Not on file    Active member of club or organization: Not on file    Attends meetings of clubs or organizations: Not on file    Relationship status: Not on file  Other Topics Concern  . Not on file  Social History Narrative   He is retired from [employer].  Previously worked for [employer] by his daughter --> also a patient of Dr. Ellyn Hack   Lives with wife in a one story home.    Education: high school.     Allergies:  No Known Allergies  Objective:    Vital Signs:   Temp:  [97.6 F (36.4 C)-100.8 F (38.2 C)]  98.4 F (36.9 C) (08/21 0804) Pulse Rate:  [75-87] 79 (08/21 0052) Resp:  [19-33] 25 (08/21 0804) BP: (85-109)/(50-64) 109/50 (08/21 0804) SpO2:  [92 %-99 %] 92 % (08/21 0804) Weight:  [79.1 kg-79.2 kg] 79.1 kg (08/21 0052) Last BM Date: 12/03/17  Weight change: Filed Weights   12/02/17 1841 12/03/17 0052  Weight: 79.2 kg 79.1 kg    Intake/Output:   Intake/Output Summary (Last 24 hours) at 12/03/2017 1023 Last data filed at 12/03/2017 0900 Gross per 24 hour  Intake 844.5 ml  Output -  Net 844.5 ml       Physical Exam    General:   No resp difficulty HEENT: normal Neck: supple. JVP 10-12. Carotids 2+ bilat; no bruits. No lymphadenopathy or thyromegaly appreciated. Cor: PMI nondisplaced. Regular rate & rhythm. No rubs, gallops or murmurs. Right tunneled PICC CDI Lungs: diminished in bases.  Abdomen: soft, nontender, nondistended. No hepatosplenomegaly. No bruits or masses. Good bowel sounds. Extremities: no cyanosis, clubbing, rash, edema Neuro: alert & orientedx3, cranial nerves grossly intact. moves all 4 extremities w/o difficulty. Affect pleasant   Telemetry   Afib 80s. Personally reviewed.   EKG    Aflutter 88 bpm with RBBB. Personally reviewed.   Labs   Basic Metabolic Panel: Recent Labs  Lab 11/27/17 0300 12/02/17 1850 12/03/17 0327  NA 140 140 141  K 3.7 3.1* 2.9*  CL 96* 96* 98  CO2 33* 32 34*  GLUCOSE 119* 137* 108*  BUN 15 24* 19  CREATININE 1.32* 1.51* 1.44*  CALCIUM 8.4* 8.1* 8.0*    Liver Function Tests: Recent Labs  Lab 12/02/17 1850  AST 35  ALT 25  ALKPHOS 140*  BILITOT 1.0  PROT 6.0*  ALBUMIN 2.8*   No results for input(s): LIPASE, AMYLASE in the last 168 hours. No results for input(s): AMMONIA in the last 168 hours.  CBC: Recent Labs  Lab 11/26/17 1429 11/27/17 0300 12/02/17 1850 12/03/17 0327  WBC 5.6 5.6 8.8 7.3  NEUTROABS  --   --  7.0  --   HGB 9.8* 9.1* 9.9* 9.1*  HCT 31.4* 29.7* 31.7* 29.3*  MCV 91.5 92.2 91.6 90.7  PLT 179 178 216 183    Cardiac Enzymes: No results for input(s): CKTOTAL, CKMB, CKMBINDEX, TROPONINI in the last 168 hours.  BNP: BNP (last 3 results) Recent Labs    09/15/17 1401 11/05/17 1310 12/02/17 1846  BNP 393.0* 515.3* 349.6*    ProBNP (last 3 results) No results for input(s): PROBNP in the last 8760 hours.   CBG: Recent Labs  Lab 11/26/17 2116 11/27/17 0806 11/27/17 1134 12/02/17 2212 12/03/17 0617  GLUCAP 115* 122* 175* 146* 118*    Coagulation Studies: Recent Labs     12/02/17 1850  LABPROT 16.5*  INR 1.34     Imaging   Dg Chest 2 View  Result Date: 12/02/2017 CLINICAL DATA:  Possible sepsis, fever, port placed last week, history of myeloma, CHF, stage III chronic kidney disease, hypertension, type II diabetes mellitus EXAM: CHEST - 2 VIEW COMPARISON:  11/17/2017 FINDINGS: RIGHT jugular central venous catheter with tip projecting over SVC. Enlargement of cardiac silhouette. Mediastinal contours and pulmonary vascularity normal. Atherosclerotic calcification aorta. LEFT lower lobe consolidation and small LEFT pleural effusion. RIGHT lung clear. No pneumothorax or acute osseous findings. IMPRESSION: LEFT lower lobe consolidation and small LEFT pleural effusion. Electronically Signed   By: Lavonia Dana M.D.   On: 12/02/2017 19:20      Medications:     Current  Medications: . amiodarone  200 mg Oral BID  . apixaban  2.5 mg Oral BID  . dronabinol  2.5 mg Oral BID AC  . gabapentin  800 mg Oral QHS  . hydrocortisone  25 mg Rectal BID  . insulin aspart  0-9 Units Subcutaneous TID WC  . magnesium oxide  400 mg Oral Daily  . Melatonin  6 mg Oral QHS  . pantoprazole  80 mg Oral Daily  . polyethylene glycol  17 g Oral Daily  . rosuvastatin  10 mg Oral Daily  . senna-docusate  2 tablet Oral BID  . sertraline  25 mg Oral Daily  . Tafamidis Meglumine (Cardiac)  80 mg Oral q1800  . traZODone  50 mg Oral QHS     Infusions: . sodium chloride Stopped (12/03/17 0810)  . ceFEPime (MAXIPIME) IV 2 g (12/03/17 1007)  . milrinone 0.125 mcg/kg/min (12/03/17 0900)  . vancomycin 150 mL/hr at 12/03/17 0900       Patient Profile   Flavius Repsher Knightis a 77 y.o.malewith a hx of chronic combined CHF due to NICM, CKD III,presumedNASH(per pt, never proven),esophageal varices per chart(prior glass of wine occasionally but no heavy alcohol),HTN, HLD, DM, Barrett's esophagus, GERD, anemia, RBBB, TTR amyloidosis, and Kappa Light Chain myeloma.  Admitted with  fever and vomiting.   Assessment/Plan   1. HCAP - Presented with fever and vomiting. CXR with LLL consolidation - On cefepime and vanc empirically.  - Blood cultures pending. WBC 7.3. Afebrile - Plans to transition to PO tomorrow and possible DC  2.Chronic combined HFwith TTR amyloidosis ([place]) -NICM by cath at [facility] 09/2016 with EF 40-45%.  - Echo 06/2017: EF 25-30%, mild LAE, mild/mod RV dilation, mod/severe RA. -PYP scan strongly suggestive of TTR amyloid, SPEP with 0.8% m-spike, UPEP with Bence Jones Protein positive, kappa type. 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 - Dr Marin Olp saw and thinks he does not have AL amyloid (just multiple myeloma) and that he has TTR amyloid and should be treated with tafamidis before chemo. Tafamidis started on 8/2.  - Volume status stable - Continue milrinone 0.125 mcg. Managed by [facility]. Coox 55% this am. - Torsemide held with vomiting. Appetite picking up. Can probably resume tomorrow. Weights stable 174 lbs.  - Continue tafamidis - No ARB/entresto/spiro with for now with CKD - SBP soft 85-100s. Hold off on hydralazine/imdur - Will let Pam with [facility] know possible discharge tomorrow.   3. Atrial Flutter s/p DCCV 7/31, 8/2 - Continue amio 200 mg BID - Continue Eliquis 2.5 mg BID - Remains in afib/flutter. Eliquis held for several days last week. He would need a TEE if we were going to repeat DCCV. Rate is well controlled. Will keep on PO amio for now and discuss with MD.   4. CKD III, baseline ~1.6 - Monitor daily BMET. Creatinine stable 1.44 this am.   5. Recurrent bilateral effusions  6. Kappa Lightchain Myeloma - Follows with Dr. Marin Olp  -Started on Bortezomib7/18/18andRevlimid7/23/19. - Dr Marin Olp thinks he does not have AL amyloid and that he does have TTR amyloid and should be treated with tafamodis before chemo. Now on Tafamidis.  7. NASH with esophageal varices.  - [facility]/2016at [facility]  surveillance of Esophageal varices, Barrett's esophagus, and PMHx of Colon polyps. Showed two columns of small esophageal varices w/out stigmata, short segment Barrett's esophagus (without biopsies, due to presence of EVs), 5cm hiatal hernia, and hyperplastic small gastric nodules.  - Continue low dose eliquis 2.5  mg BID for afib.  8. DM2 - Per primary.   9. Hypokalemia. - K 2.9. Supp. Recheck BMET this afternoon.   11. Hx of Rectal bleeding - EGD/Colon deferred with recent (2017) showing diverticulosis and hemorrhoids. - Hemoglobin 9.1 today. Denies bleeding. Continue Eliquis 2.5 mg BID   12. DNR/DNI  Medication concerns reviewed with patient and pharmacy team. Barriers identified: none.  Length of Stay: [place], NP  12/03/2017, 10:23 AM  Advanced Heart Failure Team Pager [phone] (M-F; 7a - 4p)  Please contact [facility] for night-coverage after hours (4p -7a ) and weekends on [email or web address]  Patient seen and examined with the above-signed Advanced Practice Provider and/or Housestaff. I personally reviewed laboratory data, imaging studies and relevant notes. I independently examined the patient and formulated the important aspects of the plan. I have edited the note to reflect any of my changes or salient points. I have personally discussed the plan with the patient and/or family.  77 y/o male with systolic HF and atrial flutter recently d/c's after prolonged hospitalization for atrial flutter, cardiogenic shock, sepsis and GI bleed in setting of cardiac amyloidosis. Sent home on IV milrinone via PICC. Has been doing well. Getting stronger every day. Developed low grade temp to 99.9 and vomiting so came to ER. No leukocytosis. No cough. No problem with PICC line  CXR with small left effusion with consolidation. Being treated for possible HCAP. Bcx NGTD.   On exam looks good JVP 7 Cor IRR Lungs cta. Dull at left base PICC line site ok Ab soft nt nd Ne  edema  Overall looks good. Doubt he has PNA. More likely atelectasis. Will check PCT. Give incentive spirometer. Continue milrinone. Follow bcx. If bcx negative and remains AF. Can liekly go home in am. With recurrent AF. Will drop amio to 200 daily. (continue for rate control for now)  Glori Bickers, MD  2:47 PM

## 2017-12-03 NOTE — Progress Notes (Signed)
[facility]  [name] is an active pt with [facility]- new on home Milrinone at DC to home last week.   [facility] team will follow pt while inpatient to support DC home when deemed clinically ready by hospital/HF team.  If patient discharges after hours, please call [phone].   Larry Sierras 12/03/2017, 6:55 AM

## 2017-12-03 NOTE — Care Management Note (Signed)
Case Management Note  Patient Details  Name: Dillon Andrews MRN: [id number] Date of Birth: 1940/11/19  Subjective/Objective:            HCAP        Action/Plan:  Readmission, less than 1 week. Met w patient and family at bedside. Lives at home w wife, daughter across the street, extensive family support. Followed by [facility] for home Milrinone infusion, RN PT HHA. Family would like to continue, PAM w [facility] notified of admission.  Follows at [facility]. Agreeable to [facility] services at DC. Notified Natividad Brood and placed order for consult. Patient and family deny need for additional DME.  Patient lives in one story house.  Needs HH orders.   Expected Discharge Date:  12/05/17               Expected Discharge Plan:  [place]  In-House Referral:     Discharge planning Services  CM Consult  Post Acute Care Choice:  Home Health Choice offered to:  Patient  DME Arranged:    DME Agency:     HH Arranged:  RN, PT, Nurse's Aide [place] Agency:  [place]  Status of Service:  In process, will continue to follow  If discussed at Long Length of Stay Meetings, dates discussed:    Additional Comments:  Carles Collet, RN 12/03/2017, 10:50 AM

## 2017-12-03 NOTE — Telephone Encounter (Signed)
Pt was admitted to hospital last night, spoke w/Marsha this AM and she is thankful for f/u

## 2017-12-03 NOTE — Progress Notes (Signed)
PROGRESS NOTE   Dillon Andrews  RKY:[id number]    DOB: 07-25-1940    DOA: 12/02/2017  PCP: [name], MD   I have briefly reviewed patients previous medical records in [facility].  Brief Narrative:  77 year old male with PMH of chronic combined CHF with TTR amyloidosis, EF 25-30% March 2019, multiple myeloma, paroxysmal atrial flutter, stage III chronic kidney disease, [name] with esophageal varices, DM 2, recently admitted to [facility] for close to 3 weeks, discharged 8/15, admitted at that time for acute on chronic CHF, TEE showed EF 10%, placed on milrinone and followed by advanced heart failure team, became septic, milrinone discontinued with line holiday, treated with cefepime and vancomycin, cultures were negative, new PICC line placed 8/14 and milrinone resumed 8/15.  Hospital stay was also complicated by paroxysmal atrial flutter for which placed on amiodarone and Eliquis, rectal bleeding for which Eliquis was temporarily held and resumed, palliative care consulted, DNR/DNI established but continued full scope of treatment, presented from home on 8/20 with complaints of not feeling well, fever of 101 F, nausea and an episode of nonbloody emesis, chronic dyspnea from a cough with mild productive yellow sputum and admitted for healthcare associated left lower lobe pneumonia.  Empirically started on IV cefepime and vancomycin.  Advanced heart failure team consulted.   Assessment & Plan:   Principal Problem:   HCAP (healthcare-associated pneumonia) Active Problems:   CKD (chronic kidney disease) stage 3, GFR 30-59 ml/min (HCC), baseline Cr 1.5   DM2 (diabetes mellitus, type 2) (HCC)   Bilateral pleural effusion   Esophageal varices in cirrhosis (HCC)   Cardiac amyloidosis (HCC)   Kappa light chain myeloma (HCC)   Chronic combined systolic and diastolic CHF (congestive heart failure) (HCC)   Paroxysmal atrial flutter ([place])   Suspected Healthcare associated pneumonia:  Presented with symptoms of fever, productive cough and chest x-ray showed left lower lobe consolidation.  Blood cultures x2: Negative to date.  Continue empirically started IV cefepime and vancomycin for additional 24 hours then consider transitioning to oral Augmentin.  Chronic combined systolic and diastolic CHF with TTR amyloidosis (wild-type): Advanced heart failure team consultation appreciated and please refer to their consult note for details.  Volume status appears stable.  Continue milrinone.  Torsemide briefly held due to vomiting on admission, can probably start tomorrow.  Continue Tafamidis.  No ARB/Entresto/Aldactone due to chronic kidney disease.  Soft blood pressures and hence holding off hydralazine/Imdur.  Paroxysmal atrial flutter: Management per cardiology.  Status post DCCV 7/31 and 8/2.  Continue amiodarone and Eliquis.  Stage III chronic kidney disease: Baseline creatinine approximately 1.6.  Creatinine stable in the 1.4 range for the last 2 days.  Recurrent bilateral pleural effusions: Outpatient follow-up  Kappa light chain myeloma: Outpatient follow-up with Dr. Marin Olp.  [name] with esophageal varices: No bleeding reported.  Continue low-dose Eliquis with close monitoring.  Type II DM: Reasonable inpatient control.  Hypokalemia: Aggressively replaced.  History of recent rectal bleeding: Resolved without recurrence.  Monitor closely while on Eliquis.  Anemia: Stable  DVT prophylaxis: Currently on Eliquis Code Status: DNR Family Communication: Cussed with patient's daughter and son-in-law at bedside Disposition: DC home pending clinical improvement, possibly 8/22   Consultants:  Cardiology  Procedures:  None  Antimicrobials:  IV vancomycin and cefepime   Subjective: Overall feels better.  Nausea resolved.  No recurrence of vomiting.  Had cough with mild yellow sputum.  Dyspnea improved but breathing not yet back to baseline.  Denies chest pain  or any other  complaints.  ROS: As above, otherwise negative.  Specifically no bleeding reported.  Objective:  Vitals:   12/03/17 0258 12/03/17 0313 12/03/17 0804 12/03/17 1126  BP: (!) 85/57 95/62 (!) 109/50 (!) 93/59  Pulse:      Resp: (!) 23  (!) 25 15  Temp: 97.6 F (36.4 C)  98.4 F (36.9 C) 98.2 F (36.8 C)  TempSrc: Oral  Oral Oral  SpO2: 94%  92% 97%  Weight:      Height:        Examination:  General exam: Pleasant elderly male, moderately built and nourished lying comfortably propped up in bed.  Saturating normally on room air. Respiratory system: Occasional basal crackles but otherwise clear to auscultation. Respiratory effort normal. Cardiovascular system: S1 & S2 heard, irregularly irregular. No JVD, murmurs, rubs, gallops or clicks. No pedal edema.  Telemetry personally reviewed: A. fib, BBB morphology and controlled ventricular rate. Gastrointestinal system: Abdomen is nondistended, soft and nontender. No organomegaly or masses felt. Normal bowel sounds heard. Central nervous system: Alert and oriented. No focal neurological deficits. Extremities: Symmetric 5 x 5 power. Skin: No rashes, lesions or ulcers Psychiatry: Judgement and insight appear normal. Mood & affect appropriate.     Data Reviewed: I have personally reviewed following labs and imaging studies  CBC: Recent Labs  Lab 11/27/17 0300 12/02/17 1850 12/03/17 0327  WBC 5.6 8.8 7.3  NEUTROABS  --  7.0  --   HGB 9.1* 9.9* 9.1*  HCT 29.7* 31.7* 29.3*  MCV 92.2 91.6 90.7  PLT 178 216 536   Basic Metabolic Panel: Recent Labs  Lab 11/27/17 0300 12/02/17 1850 12/03/17 0327 12/03/17 1751  NA 140 140 141 140  K 3.7 3.1* 2.9* 3.8  CL 96* 96* 98 99  CO2 33* 32 34* 32  GLUCOSE 119* 137* 108* 118*  BUN 15 24* 19 21  CREATININE 1.32* 1.51* 1.44* 1.47*  CALCIUM 8.4* 8.1* 8.0* 8.1*   Liver Function Tests: Recent Labs  Lab 12/02/17 1850  AST 35  ALT 25  ALKPHOS 140*  BILITOT 1.0  PROT 6.0*  ALBUMIN  2.8*   Coagulation Profile: Recent Labs  Lab 12/02/17 1850  INR 1.34   CBG: Recent Labs  Lab 11/27/17 1134 12/02/17 2212 12/03/17 0617 12/03/17 1112 12/03/17 1639  GLUCAP 175* 146* 118* 150* 107*    Recent Results (from the past 240 hour(s))  Culture, blood (Routine x 2)     Status: None (Preliminary result)   Collection Time: 12/02/17  6:54 PM  Result Value Ref Range Status   Specimen Description BLOOD LEFT ANTECUBITAL  Final   Special Requests   Final    BOTTLES DRAWN AEROBIC AND ANAEROBIC Blood Culture adequate volume   Culture   Final    NO GROWTH < 24 HOURS Performed at [facility], [place] [address] [place], [place] [address]    Report Status PENDING  Incomplete  Culture, blood (Routine x 2)     Status: None (Preliminary result)   Collection Time: 12/02/17  6:56 PM  Result Value Ref Range Status   Specimen Description BLOOD RIGHT ANTECUBITAL  Final   Special Requests   Final    BOTTLES DRAWN AEROBIC AND ANAEROBIC Blood Culture adequate volume   Culture   Final    NO GROWTH < 24 HOURS Performed at [facility], [place] [address], [place], [place] [address]    Report Status PENDING  Incomplete  Radiology Studies: Dg Chest 2 View  Result Date: 12/02/2017 CLINICAL DATA:  Possible sepsis, fever, port placed last week, history of myeloma, CHF, stage III chronic kidney disease, hypertension, type II diabetes mellitus EXAM: CHEST - 2 VIEW COMPARISON:  11/17/2017 FINDINGS: RIGHT jugular central venous catheter with tip projecting over SVC. Enlargement of cardiac silhouette. Mediastinal contours and pulmonary vascularity normal. Atherosclerotic calcification aorta. LEFT lower lobe consolidation and small LEFT pleural effusion. RIGHT lung clear. No pneumothorax or acute osseous findings. IMPRESSION: LEFT lower lobe consolidation and small LEFT pleural effusion. Electronically Signed   By: Lavonia Dana M.D.   On: 12/02/2017 19:20        Scheduled  Meds: . amiodarone  200 mg Oral BID  . apixaban  2.5 mg Oral BID  . dronabinol  2.5 mg Oral BID AC  . gabapentin  800 mg Oral QHS  . hydrocortisone  25 mg Rectal BID  . insulin aspart  0-9 Units Subcutaneous TID WC  . magnesium oxide  400 mg Oral Daily  . Melatonin  6 mg Oral QHS  . pantoprazole  80 mg Oral Daily  . polyethylene glycol  17 g Oral Daily  . potassium chloride  40 mEq Oral Q4H  . rosuvastatin  10 mg Oral Daily  . senna-docusate  2 tablet Oral BID  . sertraline  25 mg Oral Daily  . Tafamidis Meglumine (Cardiac)  80 mg Oral q1800  . traZODone  50 mg Oral QHS   Continuous Infusions: . sodium chloride Stopped (12/03/17 0810)  . ceFEPime (MAXIPIME) IV Stopped (12/03/17 1038)  . milrinone 0.125 mcg/kg/min (12/03/17 1100)  . vancomycin Stopped (12/03/17 0911)     LOS: 1 day     Vernell Leep, MD, FACP, [facility]. [facility] Pager [phone]  If 7PM-7AM, please contact night-coverage [email or web address] Password [facility] 12/03/2017, 6:21 PM

## 2017-12-03 NOTE — Progress Notes (Signed)
Pharmacy Antibiotic Note  Dillon Andrews is a 77 y.o. male admitted on 12/02/2017 with sepsis.  Pharmacy has been consulted for Cefepime and Vancomycin dosing.  Patient with mild fever of 100.8 overnight, currently afebrile with WBC normal at 7.3.   Discussed with primary team this morning, patient is looking better on current regimen, and if he continues to improve may send home tomorrow.   Recommend discharge on Augmentin 847m bid for 5-7 days.    Will follow up in am to ensure cultures remain no growth and renal function remains stable for recommended dose.    Height: 5' 9"  (175.3 cm) Weight: 174 lb 6.1 oz (79.1 kg) IBW/kg (Calculated) : 70.7  Temp (24hrs), Avg:98.8 F (37.1 C), Min:97.6 F (36.4 C), Max:100.8 F (38.2 C)  Recent Labs  Lab 11/26/17 1429 11/27/17 0300 12/02/17 1850 12/02/17 1900 12/03/17 0327  WBC 5.6 5.6 8.8  --  7.3  CREATININE  --  1.32* 1.51*  --  1.44*  LATICACIDVEN  --   --   --  1.49  --     Estimated Creatinine Clearance: 42.3 mL/min (A) (by C-G formula based on SCr of 1.44 mg/dL (H)).    No Known Allergies  Antimicrobials this admission: 8/20 Vancomycin >>  8/20 Cefepime >>    Microbiology results: 8/20 BCx: SENT  Thank you for allowing pharmacy to be a part of this patient's care.  FErin HearingPharmD., BCPS Clinical Pharmacist 12/03/2017 10:54 AM

## 2017-12-04 ENCOUNTER — Encounter (HOSPITAL_COMMUNITY): Payer: Medicare HMO

## 2017-12-04 ENCOUNTER — Inpatient Hospital Stay: Payer: Medicare HMO

## 2017-12-04 DIAGNOSIS — Z803 Family history of malignant neoplasm of breast: Secondary | ICD-10-CM

## 2017-12-04 DIAGNOSIS — E119 Type 2 diabetes mellitus without complications: Secondary | ICD-10-CM | POA: Diagnosis not present

## 2017-12-04 DIAGNOSIS — N183 Chronic kidney disease, stage 3 (moderate): Secondary | ICD-10-CM | POA: Diagnosis not present

## 2017-12-04 DIAGNOSIS — I509 Heart failure, unspecified: Secondary | ICD-10-CM

## 2017-12-04 DIAGNOSIS — I5043 Acute on chronic combined systolic (congestive) and diastolic (congestive) heart failure: Secondary | ICD-10-CM | POA: Diagnosis not present

## 2017-12-04 DIAGNOSIS — Z809 Family history of malignant neoplasm, unspecified: Secondary | ICD-10-CM

## 2017-12-04 DIAGNOSIS — D509 Iron deficiency anemia, unspecified: Secondary | ICD-10-CM

## 2017-12-04 DIAGNOSIS — J181 Lobar pneumonia, unspecified organism: Secondary | ICD-10-CM

## 2017-12-04 DIAGNOSIS — I4891 Unspecified atrial fibrillation: Secondary | ICD-10-CM

## 2017-12-04 DIAGNOSIS — Z87891 Personal history of nicotine dependence: Secondary | ICD-10-CM

## 2017-12-04 DIAGNOSIS — E8581 Light chain (AL) amyloidosis: Secondary | ICD-10-CM

## 2017-12-04 LAB — COOXEMETRY PANEL
CARBOXYHEMOGLOBIN: 1.7 % — AB (ref 0.5–1.5)
Methemoglobin: 1.7 % — ABNORMAL HIGH (ref 0.0–1.5)
O2 Saturation: 61.5 %
Total hemoglobin: 6 g/dL — CL (ref 12.0–16.0)

## 2017-12-04 LAB — BASIC METABOLIC PANEL
ANION GAP: 6 (ref 5–15)
BUN: 18 mg/dL (ref 8–23)
CALCIUM: 8.3 mg/dL — AB (ref 8.9–10.3)
CO2: 32 mmol/L (ref 22–32)
Chloride: 102 mmol/L (ref 98–111)
Creatinine, Ser: 1.32 mg/dL — ABNORMAL HIGH (ref 0.61–1.24)
GFR calc Af Amer: 58 mL/min — ABNORMAL LOW (ref >60)
GFR, EST NON AFRICAN AMERICAN: 50 mL/min — AB (ref >60)
GLUCOSE: 119 mg/dL — AB (ref 70–99)
Potassium: 4.1 mmol/L (ref 3.5–5.1)
Sodium: 140 mmol/L (ref 135–145)

## 2017-12-04 LAB — GLUCOSE, CAPILLARY
GLUCOSE-CAPILLARY: 178 mg/dL — AB (ref 70–99)
Glucose-Capillary: 119 mg/dL — ABNORMAL HIGH (ref 70–99)

## 2017-12-04 LAB — PROCALCITONIN: Procalcitonin: <0.1 ng/mL

## 2017-12-04 MED ORDER — TORSEMIDE 20 MG PO TABS
20.0000 mg | ORAL_TABLET | Freq: Every day | ORAL | Status: DC
  Administered 2017-12-04: 20 mg via ORAL
  Filled 2017-12-04: qty 1

## 2017-12-04 MED ORDER — POTASSIUM CHLORIDE CRYS ER 20 MEQ PO TBCR
20.0000 meq | EXTENDED_RELEASE_TABLET | Freq: Every day | ORAL | Status: DC
  Administered 2017-12-04: 20 meq via ORAL
  Filled 2017-12-04: qty 1

## 2017-12-04 MED ORDER — SODIUM CHLORIDE 0.9 % IV SOLN
510.0000 mg | Freq: Once | INTRAVENOUS | Status: AC
  Administered 2017-12-04: 510 mg via INTRAVENOUS
  Filled 2017-12-04: qty 17

## 2017-12-04 MED ORDER — DARBEPOETIN ALFA 300 MCG/0.6ML IJ SOSY
300.0000 ug | PREFILLED_SYRINGE | Freq: Once | INTRAMUSCULAR | Status: DC
  Filled 2017-12-04: qty 0.6

## 2017-12-04 NOTE — Discharge Instructions (Signed)

## 2017-12-04 NOTE — Consult Note (Signed)
   [facility] Inpatient Consult   12/04/2017  Constant W Gierke [birth year] [id number]   Referral received to assess for care management services.    Met with the patient and his daughter [name] regarding the benefits of [place] [facility] services.  Explained that [place] [facility] is a covered benefit of insurance, confirmed as [employer]. Review information for [facility] Care Management and a folder was provided with contact information.  Explained that [place] [facility] does not interfere with or replace any services arranged by the inpatient care management staff.  Patient politely declined services with [place] [facility] stating, "I have a nurse from [facility] coming twice a week for my Milrinone pump. Patient did accept a brochure and 24 hour nurse line magnet.   Natividad Brood, RN BSN [facility] Liaison  [phone] business mobile phone Toll free office [phone]

## 2017-12-04 NOTE — Progress Notes (Addendum)
Advanced Heart Failure Rounding Note  PCP-Cardiologist: Glenetta Hew, MD   Subjective:    Cr 1.32. K 4.1.  Feeling OK this am. No fever. No productive cough. Denies SOB, lightheadedness or dizziness.   PCT < 0.10 x 2. Afebrile. WBC 7.4 12/03/17.  Objective:   Weight Range: 79.1 kg Body mass index is 25.75 kg/m.   Vital Signs:   Temp:  [98.2 F (36.8 C)-98.7 F (37.1 C)] 98.5 F (36.9 C) (08/22 0757) Pulse Rate:  [88-111] 89 (08/22 0757) Resp:  [15-29] 21 (08/22 0757) BP: (93-114)/(59-69) 97/69 (08/22 0757) SpO2:  [94 %-97 %] 96 % (08/22 0757) Weight:  [79.1 kg] 79.1 kg (08/22 0526) Last BM Date: 12/03/17  Weight change: Filed Weights   12/02/17 1841 12/03/17 0052 12/04/17 0526  Weight: 79.2 kg 79.1 kg 79.1 kg   Intake/Output:   Intake/Output Summary (Last 24 hours) at 12/04/2017 0844 Last data filed at 12/04/2017 0528 Gross per 24 hour  Intake 413.67 ml  Output 350 ml  Net 63.67 ml    Physical Exam    General:  NAD.  HEENT: Normal Neck: Supple. JVP ~8-9 cm. Carotids 2+ bilat; no bruits. No lymphadenopathy or thyromegaly appreciated. Cor: PMI nondisplaced. Regular rate & rhythm. No rubs, gallops or murmurs. Lungs: Diminished in bases. Abdomen: Soft, nontender, nondistended. No hepatosplenomegaly. No bruits or masses. Good bowel sounds. Extremities: No cyanosis, clubbing, rash, edema Neuro: Alert & orientedx3, cranial nerves grossly intact. moves all 4 extremities w/o difficulty. Affect pleasant  Telemetry   AFL 80-90s, personally reviewed.   EKG    AFL 88 bpm on admit, personally reviewed.   Labs    CBC Recent Labs    12/02/17 1850 12/03/17 0327  WBC 8.8 7.3  NEUTROABS 7.0  --   HGB 9.9* 9.1*  HCT 31.7* 29.3*  MCV 91.6 90.7  PLT 216 242   Basic Metabolic Panel Recent Labs    12/03/17 1751 12/04/17 0500  NA 140 140  K 3.8 4.1  CL 99 102  CO2 32 32  GLUCOSE 118* 119*  BUN 21 18  CREATININE 1.47* 1.32*  CALCIUM 8.1* 8.3*    Liver Function Tests Recent Labs    12/02/17 1850  AST 35  ALT 25  ALKPHOS 140*  BILITOT 1.0  PROT 6.0*  ALBUMIN 2.8*   No results for input(s): LIPASE, AMYLASE in the last 72 hours. Cardiac Enzymes No results for input(s): CKTOTAL, CKMB, CKMBINDEX, TROPONINI in the last 72 hours.  BNP: BNP (last 3 results) Recent Labs    09/15/17 1401 11/05/17 1310 12/02/17 1846  BNP 393.0* 515.3* 349.6*    ProBNP (last 3 results) No results for input(s): PROBNP in the last 8760 hours.   D-Dimer No results for input(s): DDIMER in the last 72 hours. Hemoglobin A1C No results for input(s): HGBA1C in the last 72 hours. Fasting Lipid Panel No results for input(s): CHOL, HDL, LDLCALC, TRIG, CHOLHDL, LDLDIRECT in the last 72 hours. Thyroid Function Tests No results for input(s): TSH, T4TOTAL, T3FREE, THYROIDAB in the last 72 hours.  Invalid input(s): FREET3  Other results:   Imaging     No results found.   Medications:     Scheduled Medications: . amiodarone  200 mg Oral BID  . apixaban  2.5 mg Oral BID  . dronabinol  2.5 mg Oral BID AC  . gabapentin  800 mg Oral QHS  . hydrocortisone  25 mg Rectal BID  . insulin aspart  0-9 Units Subcutaneous TID WC  .  magnesium oxide  400 mg Oral Daily  . Melatonin  6 mg Oral QHS  . pantoprazole  80 mg Oral Daily  . polyethylene glycol  17 g Oral Daily  . rosuvastatin  10 mg Oral Daily  . senna-docusate  2 tablet Oral BID  . sertraline  25 mg Oral Daily  . Tafamidis Meglumine (Cardiac)  80 mg Oral q1800  . traZODone  50 mg Oral QHS     Infusions: . sodium chloride Stopped (12/03/17 0810)  . ceFEPime (MAXIPIME) IV Stopped (12/03/17 1038)  . ferumoxytol    . milrinone 0.125 mcg/kg/min (12/03/17 2344)  . vancomycin 750 mg (12/03/17 2046)     PRN Medications:  sodium chloride, ondansetron, zolpidem    Patient Profile   Dillon Andrews is a 77 y.o. malewith a hx of chronic combined CHF due to NICM, CKD  III,presumedNASH(per pt, never proven),esophageal varices per chart(prior glass of wine occasionally but no heavy alcohol),HTN, HLD, DM, Barrett's esophagus, GERD, anemia, RBBB, TTR amyloidosis, and Kappa Light Chain myeloma.  Admitted with fever and vomiting.   Assessment/Plan   1. ?HCAP - Presented with low grade fever and vomiting. CXR with concerns for LLL consolidation - Received cefepime and vanc empirically. Could transition to po for coverage for home, no further fever or WBC. Per primary.  - PCT negative x 2.  - BCx No growth so far. Afebrile.  2.Chronic combined HFwith TTR amyloidosis (Wild Type) -NICM by cath at [facility] 09/2016 with EF 40-45%.  - Echo 06/2017: EF 25-30%, mild LAE, mild/mod RV dilation, mod/severe RA. -PYP scan strongly suggestive of TTR amyloid, SPEP with 0.8% m-spike, UPEP with Bence Jones Protein positive, kappa type. 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 - Dr Marin Olp saw and thinks he does not have AL amyloid (just multiple myeloma) and that he has TTR amyloid and should be treated with tafamidis before chemo. Tafamidis started on 8/2.  -Volume status stable on exam.  - Continue milrinone 0.125 mcg.Managed by [facility]. Coox 61.5% this am. - Torsemide held with vomiting. Appetite picking up. Can probably resume tomorrow. Weights stable 174 lbs.  - Continue tafamidis - No ARB/entresto/spiro with for now with CKD - SBP soft 85-100s. Hold off on hydralazine/imdur [facility] following for discharge.  3. Atrial Flutter s/p DCCV 7/31, 8/2 - Continue amio 200 mg BID - Continue Eliquis 2.5 mg BID - Remains in afib/flutter. Eliquis held for several days last week. He would need a TEE if we were going to repeat DCCV. Rate is well controlled. Will keep on PO amio and follow for now.  4. CKD III, baseline ~1.6 - Cr 1.32 this am.  5. Recurrent bilateral effusions - May have been what was seen on CXR.  6. [place] with Dr. Marin Olp    -Started on Bortezomib7/18/18andRevlimid7/23/19. - Dr Marin Olp thinks he does not have AL amyloid and that he does have TTR amyloid and should be treated with tafamodis before chemo. Now on Tafamidis. No change.  7. NASH with esophageal varices.  - Last EGD[facility]/8/2016at [facility] surveillance of Esophageal varices, Barrett's esophagus, and PMHx of Colon polyps. Showed two columns of small esophageal varices w/out stigmata, short segment Barrett's esophagus (without biopsies, due to presence of EVs), 5cm hiatal hernia, and hyperplastic small gastric nodules.  - Continue low dose eliquis 2.5 mg BID for afib. No change.  8. DM2 - Per primary.  9. Hypokalemia. - K4.1 this am.  11. Hx of Rectal bleeding -  EGD/Colon deferred with recent (2017) showing diverticulosis and hemorrhoids. - Hemoglobin 9.1 12/03/17. No bleeding. Eliquis 2.5 mg BID  12. DNR/DNI   From a heart failure perspective, he is stable for home today as of 12/04/17  HF Medication Recommendations for Home: Resume PTA medications at previous doses.   Follow up as an outpatient scheduled for 12/10/17 with Dr. [name].   Medication concerns reviewed with patient and pharmacy team. Barriers identified: None at this time.   Length of Stay: 2  Annamaria Helling  12/04/2017, 8:44 AM  Advanced Heart Failure Team Pager [phone] (M-F; 7a - 4p)  Please contact [facility] for night-coverage after hours (4p -7a ) and weekends on [email or web address]  Patient seen and examined with the above-signed Advanced Practice Provider and/or Housestaff. I personally reviewed laboratory data, imaging studies and relevant notes.  He is doing well. No infectious symptoms. Breathing ok. PCT < 0.10. Hgb stable. Can stop abx. Ok to d/c from our standpoint. We will f/u in HF Clinic.   Glori Bickers, MD  2:41 PM

## 2017-12-04 NOTE — Progress Notes (Signed)
12/04/2017 1420 Discharge AVS meds taken today and those due this evening reviewed.  Follow-up appointments and when to call md reviewed.  D/C IV and TELE.  Questions and concerns addressed.   D/C home per orders. [place]

## 2017-12-04 NOTE — Care Management Note (Signed)
Case Management Note Marvetta Gibbons RN, BSN Unit 4E- RN Care Coordinator  [phone]  Patient Details  Name: LORENE SAMAAN MRN: [id number] Date of Birth: [birth year]  Subjective/Objective:    Pt admitted with HCAP, HF- on home milrinone                Action/Plan: PTA pt lived at home with wife active with [facility] Rn/PT/HHA and home IV infusion for milrinone. Resumption orders have been placed for HHHF orders including IV milrinone- have spoken  With both Pam and Butch Penny at [facility] regarding transition to home today and resumption of HH needs. Pam will see pt later today to resume home IV milrinone infusion at the bedside prior to discharge. Bedside RNJuliann Pulse aware   Expected Discharge Date:  12/05/17               Expected Discharge Plan:  [place]  In-House Referral:     Discharge planning Services  CM Consult  Post Acute Care Choice:  Home Health, Resumption of Svcs/PTA Provider Choice offered to:  Patient  DME Arranged:    DME Agency:     HH Arranged:  RN, PT, Nurse's Aide, Disease Management [place] Agency:  [place]  Status of Service:  Completed, signed off  If discussed at [place] of Stay Meetings, dates discussed:    Discharge Disposition: home/home health  Additional Comments:  Dawayne Patricia, RN 12/04/2017, 11:04 AM

## 2017-12-04 NOTE — Consult Note (Signed)
Referral MD  Reason for Referral: Cardiac amyloidosis; rapid atrial fibrillation; possible left lower lobe pneumonia  Chief Complaint  Patient presents with  . Fever  : I had some temperature.    HPI: Mr. Grisby is a very nice 77 year old white male.  He is well-known to me.  He was just discharged less than a week ago from the hospital.  At that time, he was admitted because of heart failure and rapid A. fib.  He now is on the new medications for transthyretin amyloidosis.  He said he had a little bit of a temperature.  He had some palpitations.  He had a little bit of shortness of breath.  He came in.  A chest x-ray was done which showed left lower lobe consolidation.  He had rapid A. fib.  He was admitted on the 20th.  His labs weekend showed a white cell count of 8.8.  Hemoglobin 9.9.  Platelet count 216,000.  Yesterday, his white cell count was 7.3.  Hemoglobin 9.1.  Platelet count 183,000.  His electrolytes show potassium of 3.1 on admission.  His BUN was 24 creatinine 1.5.  Albumin 2.8.  Total protein 6.  He was told that he did not have pneumonia.  He is on antibiotics.  He does not think he will be cardioverted.  He is anemic.  He is iron deficient.  His ferritin is 109 with an iron saturation of 13%.  This was done a week ago.  We were going to give a dose of IV iron.  His erythropoietin level has not been drawn.  This might be helpful.  I would want to hold off on a transfusion for him.  However, a transfusion might help in the long run.  There is no obvious bleeding.  I think when he was hospitalized a week or so ago, he did have some GI bleeding.  Overall, I said his performance status is ECOG 1-2.   Past Medical History:  Diagnosis Date  . Anemia 11/2016  . Arthritis    "some; hands/fingers" (06/25/2017)  . Atrial tachycardia ([place])   . Barrett's esophagus   . Basal cell carcinoma    "face, arms, hands" (06/25/2017)  . Chicken pox   . Cholelithiasis 11/2016   S/P LAP CHOLE  . Chronic combined systolic and diastolic CHF (congestive heart failure) ([place])   . CKD (chronic kidney disease), stage III ([place])   . Esophageal varices ([facility])    Secondary to NAFLD  . Family history of adverse reaction to anesthesia    "daughter's sat drop very low; last OR had to be reintubated" (06/25/2017)  . GERD (gastroesophageal reflux disease)   . Goals of care, counseling/discussion 10/22/2017  . High cholesterol   . Hypertension   . Kappa light chain myeloma ([place]) 10/22/2017  . Mitral regurgitation   . NAFLD (nonalcoholic fatty liver disease)   . NICM (nonischemic cardiomyopathy) ([place])    Echo 06/2017: Severe LVH.  EF 25 to 30% with mild HK of the baseline after septal wall.  Moderate to severely dilated right atrium, moderately dilated RV. (Concern fr ? amyloidosis).  . NSVT (nonsustained ventricular tachycardia) ([place])   . Pneumonia 09/2016  . RBBB   . Tricuspid regurgitation   . Type II diabetes mellitus ([place])   :  Past Surgical History:  Procedure Laterality Date  . BACK SURGERY    . BASAL CELL CARCINOMA EXCISION Right    temple; "had to do skin graft"  . CARDIAC CATHETERIZATION  09/2016  Minimal, non-occlusive CAD, EF 45% [facility])  . CARDIOVERSION N/A 11/10/2017   Procedure: CARDIOVERSION;  Surgeon: Jolaine Artist, MD;  Location[facility] ENDOSCOPY;  Service: Cardiovascular;  Laterality: N/A;  . CARDIOVERSION N/A 11/14/2017   Procedure: CARDIOVERSION;  Surgeon: Jolaine Artist, MD;  Location: [facility] ENDOSCOPY;  Service: Cardiovascular;  Laterality: N/A;  . CARPAL TUNNEL RELEASE Bilateral   . CATARACT EXTRACTION W/ INTRAOCULAR LENS  IMPLANT, BILATERAL Bilateral   . CHOLECYSTECTOMY N/A 11/22/2016   Procedure: LAPAROSCOPIC CHOLECYSTECTOMY;  Surgeon: Coralie Keens, MD;  Location: [place];  Service: General;  Laterality: N/A;  . IR FLUORO GUIDE CV LINE RIGHT  11/26/2017  . IR US GUIDE VASC ACCESS RIGHT  11/26/2017  . JOINT REPLACEMENT    . LUMBAR  DISC SURGERY  X 2  . TEE WITHOUT CARDIOVERSION N/A 11/10/2017   Procedure: TRANSESOPHAGEAL ECHOCARDIOGRAM (TEE);  Surgeon: Jolaine Artist, MD;  Location[facility] ENDOSCOPY;  Service: Cardiovascular;  Laterality: N/A;  . TONSILLECTOMY AND ADENOIDECTOMY    . TOTAL KNEE ARTHROPLASTY Left 2010  . TRANSESOPHAGEAL ECHOCARDIOGRAM  09/2016   Mild global LV hypokinesis. Moderate MR. Moderate TR. [facility])  . TRANSTHORACIC ECHOCARDIOGRAM  10/2016   [facility]) EF 35-40% with moderate HK. Normal RV function. Moderately severe TR.  Marland Kitchen TRANSTHORACIC ECHOCARDIOGRAM  03/2017; 06/2017    A) ([place]) mild concentric LVH. EF of 40-45%. Diffuse HK with no RWMA. GR 2 DD. Septal dyssynergy. Mild MR, moderate TR. Severe RA dilation..;; B) Severe LVH.  EF 25 to 30% with mild HK of Septal wall.  Mod-severely dilated RA & Mod dilated RV.  :   Current Facility-Administered Medications:  .  0.9 %  sodium chloride infusion, , Intravenous, PRN, Modena Jansky, MD, Stopped at 12/03/17 0810 .  amiodarone (PACERONE) tablet 200 mg, 200 mg, Oral, BID, Etta Quill, DO, 200 mg at 12/03/17 2104 .  apixaban (ELIQUIS) tablet 2.5 mg, 2.5 mg, Oral, BID, Etta Quill, DO, 2.5 mg at 12/03/17 2104 .  ceFEPIme (MAXIPIME) 2 g in sodium chloride 0.9 % 100 mL IVPB, 2 g, Intravenous, Q24H, Rudisill, Jodean Lima, RPH, Stopped at 12/03/17 1038 .  Darbepoetin Alfa (ARANESP) injection 300 mcg, 300 mcg, Subcutaneous, Once, Ennever, Rudell Cobb, MD .  dronabinol (MARINOL) capsule 2.5 mg, 2.5 mg, Oral, BID AC, Gardner, Jared M, DO, 2.5 mg at 12/03/17 1721 .  ferumoxytol (FERAHEME) 510 mg in sodium chloride 0.9 % 100 mL IVPB, 510 mg, Intravenous, Once, Ennever, Rudell Cobb, MD .  gabapentin (NEURONTIN) capsule 800 mg, 800 mg, Oral, QHS, Alcario Drought, Jared M, DO, 800 mg at 12/03/17 2057 .  hydrocortisone (ANUSOL-HC) suppository 25 mg, 25 mg, Rectal, BID, Alcario Drought, Jared M, DO .  insulin aspart (novoLOG) injection 0-9 Units, 0-9  Units, Subcutaneous, TID WC, Etta Quill, DO, 1 Units at 12/03/17 1138 .  magnesium oxide (MAG-OX) tablet 400 mg, 400 mg, Oral, Daily, Jennette Kettle M, DO, 400 mg at 12/03/17 1001 .  Melatonin TABS 6 mg, 6 mg, Oral, QHS, Gardner, Jared M, DO, 6 mg at 12/03/17 2056 .  milrinone (PRIMACOR) 20 MG/100 ML (0.2 mg/mL) infusion, 0.125 mcg/kg/min, Intravenous, Continuous, Rudisill, Jodean Lima, RPH, Last Rate: 2.97 mL/hr at 12/03/17 2344, 0.125 mcg/kg/min at 12/03/17 2344 .  ondansetron (ZOFRAN) tablet 4 mg, 4 mg, Oral, Q8H PRN, Alcario Drought, Jared M, DO .  pantoprazole (PROTONIX) EC tablet 80 mg, 80 mg, Oral, Daily, Jennette Kettle M, DO, 80 mg at 12/03/17 1002 .  polyethylene glycol (  MIRALAX / GLYCOLAX) packet 17 g, 17 g, Oral, Daily, Alcario Drought, Jared M, DO .  rosuvastatin (CRESTOR) tablet 10 mg, 10 mg, Oral, Daily, Jennette Kettle M, DO, 10 mg at 12/03/17 1003 .  senna-docusate (Senokot-S) tablet 2 tablet, 2 tablet, Oral, BID, Alcario Drought, Jared M, DO .  sertraline (ZOLOFT) tablet 25 mg, 25 mg, Oral, Daily, Jennette Kettle M, DO, 25 mg at 12/03/17 1001 .  Tafamidis Meglumine (Cardiac) CAPS 80 mg, 80 mg, Oral, q1800, Etta Quill, DO, 80 mg at 12/03/17 1845 .  traZODone (DESYREL) tablet 50 mg, 50 mg, Oral, QHS, Gardner, Jared M, DO, 50 mg at 12/03/17 2056 .  vancomycin (VANCOCIN) IVPB 750 mg/150 ml premix, 750 mg, Intravenous, Q12H, Rudisill, Jodean Lima, RPH, Last Rate: 150 mL/hr at 12/03/17 2046, 750 mg at 12/03/17 2046 .  zolpidem (AMBIEN) tablet 5 mg, 5 mg, Oral, QHS PRN, Etta Quill, DO, 5 mg at 12/03/17 2056:  . amiodarone  200 mg Oral BID  . apixaban  2.5 mg Oral BID  . darbepoetin (ARANESP) injection - NON-DIALYSIS  300 mcg Subcutaneous Once  . dronabinol  2.5 mg Oral BID AC  . gabapentin  800 mg Oral QHS  . hydrocortisone  25 mg Rectal BID  . insulin aspart  0-9 Units Subcutaneous TID WC  . magnesium oxide  400 mg Oral Daily  . Melatonin  6 mg Oral QHS  . pantoprazole  80 mg Oral Daily  .  polyethylene glycol  17 g Oral Daily  . rosuvastatin  10 mg Oral Daily  . senna-docusate  2 tablet Oral BID  . sertraline  25 mg Oral Daily  . Tafamidis Meglumine (Cardiac)  80 mg Oral q1800  . traZODone  50 mg Oral QHS  :  No Known Allergies:  Family History  Problem Relation Age of Onset  . Heart failure Mother   . Cancer Father   . Cataracts Father   . Breast cancer Sister   . Alcohol abuse Brother   . Liver disease Brother   . Autoimmune disease Daughter        Bechet's syndrome  . Cirrhosis Neg Hx   :  Social History   Socioeconomic History  . Marital status: Married    Spouse name: Not on file  . Number of children: 4  . Years of education: 65  . Highest education level: Not on file  Occupational History  . Occupation: retired  Scientific laboratory technician  . Financial resource strain: Not on file  . Food insecurity:    Worry: Not on file    Inability: Not on file  . Transportation needs:    Medical: Not on file    Non-medical: Not on file  Tobacco Use  . Smoking status: Former Smoker    Packs/day: 0.30    Years: 15.00    Pack years: 4.50    Types: Cigarettes    Last attempt to quit: 03/17/1977    Years since quitting: 40.7  . Smokeless tobacco: Never Used  Substance and Sexual Activity  . Alcohol use: Yes    Comment: 06/25/2017 "nothing since 09/2016;  used to have glass of wine q hs"  . Drug use: No  . Sexual activity: Not on file  Lifestyle  . Physical activity:    Days per week: Not on file    Minutes per session: Not on file  . Stress: Not on file  Relationships  . Social connections:    Talks on phone: Not on file  Gets together: Not on file    Attends religious service: Not on file    Active member of club or organization: Not on file    Attends meetings of clubs or organizations: Not on file    Relationship status: Not on file  . Intimate partner violence:    Fear of current or ex partner: Not on file    Emotionally abused: Not on file    Physically  abused: Not on file    Forced sexual activity: Not on file  Other Topics Concern  . Not on file  Social History Narrative   He is retired from [employer].  Previously worked for [employer] by his daughter --> also a patient of Dr. Ellyn Hack   Lives with wife in a one story home.    Education: high school.   :  Pertinent items are noted in HPI.  Exam: See above Patient Vitals for the past 24 hrs:  BP Temp Temp src Pulse Resp SpO2 Weight  12/04/17 0526 103/62 98.2 F (36.8 C) Oral (!) 111 (!) 23 95 % 174 lb 6.1 oz (79.1 kg)  12/03/17 2354 104/63 98.2 F (36.8 C) Oral 88 (!) 29 94 % -  12/03/17 2017 114/69 98.7 F (37.1 C) Oral 88 (!) 21 97 % -  12/03/17 1126 (!) 93/59 98.2 F (36.8 C) Oral - 15 97 % -  12/03/17 0804 (!) 109/50 98.4 F (36.9 C) Oral - (!) 25 92 % -     Recent Labs    12/02/17 1850 12/03/17 0327  WBC 8.8 7.3  HGB 9.9* 9.1*  HCT 31.7* 29.3*  PLT 216 183   Recent Labs    12/03/17 0327 12/03/17 1751  NA 141 140  K 2.9* 3.8  CL 98 99  CO2 34* 32  GLUCOSE 108* 118*  BUN 19 21  CREATININE 1.44* 1.47*  CALCIUM 8.0* 8.1*    Blood smear review: None  Pathology: None    Assessment and Plan: Mr. Duddy is a 77 year old white male.  He has amyloidosis.  It also might be an element of light chain amyloidosis.  However, I do not suspect that the light chain amyloidosis is causing problems for him.  I will try him on some IV iron.  We will see how that helps.  He still may require a blood transfusion to help with his anemia.  I would think with a hemoglobin less than 10, he does not have a lot of "room." for anemia.  Again, we will see what the IV iron can do.  I told him that it may take a week or so before that can work.  I would not think that his light chain amyloidosis is a problem with this admission.  I would not think that his immunoglobulin levels would be low enough that would increase his risk for infection.  We will  follow along.  He says that he might be going home today.  Lattie Haw, MD  Philippians 4:19

## 2017-12-04 NOTE — Discharge Summary (Signed)
Physician Discharge Summary  Dillon Andrews YPP:[id number] DOB: Jun 08, 1940  PCP: Dillon Maw, MD  Admit date: 12/02/2017 Discharge date: 12/04/2017  Recommendations for Outpatient Follow-up:  1. Dr. Glori Andrews, Cardiology on 12/10/2017 at 2 PM.  To be seen with repeat labs (CBC & BMP). 2. Dr. Libby Andrews, PCP 3. Dr. Glenetta Andrews, Cardiology  Home Health: Resume home health services (RN/PT/home health aide) and home IV milrinone infusion Equipment/Devices: None  Discharge Condition: Improved and stable CODE STATUS: DNR Diet recommendation: Heart healthy & diabetic diet.  Discharge Diagnoses:  Principal Problem:   HCAP (healthcare-associated pneumonia) Active Problems:   CKD (chronic kidney disease) stage 3, GFR 30-59 ml/min (HCC), baseline Cr 1.5   DM2 (diabetes mellitus, type 2) (HCC)   Bilateral pleural effusion   Esophageal varices in cirrhosis (HCC)   Cardiac amyloidosis (HCC)   Kappa light chain myeloma (HCC)   Chronic combined systolic and diastolic CHF (congestive heart failure) (HCC)   Paroxysmal atrial flutter [facility])   Brief Summary: 77 year old male with PMH of chronic combined CHF with TTR amyloidosis, EF 25-30% March 2019, multiple myeloma, paroxysmal atrial flutter, stage III chronic kidney disease, Dillon Andrews with esophageal varices, DM 2, recently admitted to [facility] Andrews for close to 3 weeks, discharged 8/15, admitted at that time for acute on chronic CHF, TEE showed EF 10%, placed on milrinone and followed by advanced heart failure team, became septic, milrinone discontinued with line holiday, treated with cefepime and vancomycin, cultures were negative, new PICC line placed 8/14 and milrinone resumed 8/15.  Hospital stay was also complicated by paroxysmal atrial flutter for which placed on amiodarone and Eliquis, rectal bleeding for which Eliquis was temporarily held and resumed, palliative care consulted, DNR/DNI established but continued full scope of  treatment, presented from home on 8/20 with complaints of not feeling well, fever of 101 F, nausea and an episode of nonbloody emesis, chronic dyspnea from a cough with mild productive yellow sputum and admitted for healthcare associated left lower lobe pneumonia.  Empirically started on IV cefepime and vancomycin.  Advanced heart failure team consulted.   Assessment & Plan:   Suspected Healthcare associated pneumonia: Presented with symptoms of fever, productive cough in the context of an episode of vomiting and chest x-ray showed left lower lobe consolidation.  Blood cultures x2: Negative to date.  He was treated empirically with IV cefepime and vancomycin.  However upon careful review it was not convincing that he had a pneumonia.  The lobar consolidation may have been atelectasis.  Procalcitonin was checked and negative.  He may have had a mild aspiration pneumonitis.  Clinically improved.  Discharged home without antibiotics.  Recommend follow-up of chest x-ray in 4 weeks.  Chronic combined systolic and diastolic CHF with TTR amyloidosis (wild-type): Advanced heart failure team consulted and please refer to their consult note for details.  Volume status appears stable.  Continue milrinone.  Torsemide briefly held due to vomiting on admission, and resumed at discharge.  Continue Tafamidis.  No ARB/Entresto/Aldactone due to chronic kidney disease.  Soft blood pressures and hence holding off hydralazine/Imdur.  Paroxysmal atrial flutter: Management per cardiology.  Status post DCCV 7/31 and 8/2.  Continue amiodarone and Eliquis.  Stage III chronic kidney disease: Baseline creatinine approximately 1.6.  Creatinine stable.  Recurrent bilateral pleural effusions: Outpatient follow-up  Kappa light chain myeloma: Outpatient follow-up with Dr. Marin Andrews.  NASH with esophageal varices: No bleeding reported. Close monitoring while on Eliquis.  Type II DM: Reasonable inpatient  control.  Hypokalemia: Aggressively replaced.  History of recent rectal bleeding: Resolved previous admission without recurrence.  Monitor closely while on Eliquis.  Anemia: Stable. Received IV Iron per Hematologist.   Consultants:  Cardiology Hematology/Oncology  Procedures:  None   Discharge Instructions  Discharge Instructions    (HEART FAILURE PATIENTS) Call MD:  Anytime you have any of the following symptoms: 1) 3 pound weight gain in 24 hours or 5 pounds in 1 week 2) shortness of breath, with or without a dry hacking cough 3) swelling in the hands, feet or stomach 4) if you have to sleep on extra pillows at night in order to breathe.   Complete by:  As directed    Call MD for:  difficulty breathing, headache or visual disturbances   Complete by:  As directed    Call MD for:  extreme fatigue   Complete by:  As directed    Call MD for:  persistant dizziness or light-headedness   Complete by:  As directed    Call MD for:  persistant nausea and vomiting   Complete by:  As directed    Call MD for:  severe uncontrolled pain   Complete by:  As directed    Call MD for:  temperature >100.4   Complete by:  As directed    Diet - low sodium heart healthy   Complete by:  As directed    Diet Carb Modified   Complete by:  As directed    Increase activity slowly   Complete by:  As directed        Medication List    TAKE these medications   amiodarone 200 MG tablet Commonly known as:  PACERONE Take 1 tablet (200 mg total) by mouth 2 (two) times daily.   apixaban 2.5 MG Tabs tablet Commonly known as:  ELIQUIS Take 1 tablet (2.5 mg total) by mouth 2 (two) times daily.   dronabinol 2.5 MG capsule Commonly known as:  MARINOL Take 1 capsule (2.5 mg total) by mouth 2 (two) times daily before lunch and supper.   gabapentin 800 MG tablet Commonly known as:  NEURONTIN TAKE 1 TABLET BY MOUTH AT BEDTIME   glimepiride 1 MG tablet Commonly known as:  AMARYL Take 1 mg by  mouth daily.   glucose blood test strip Use as instructed   hydrocortisone 25 MG suppository Commonly known as:  ANUSOL-HC Place 1 suppository (25 mg total) rectally 2 (two) times daily. What changed:    when to take this  reasons to take this   magnesium oxide 400 MG tablet Commonly known as:  MAG-OX Take 400 mg by mouth daily.   Melatonin 5 MG Tabs Take 5 mg by mouth at bedtime.   milrinone 20 MG/100 ML Soln infusion Commonly known as:  PRIMACOR Inject 0.0099 mg/min into the vein continuous.   omeprazole 40 MG capsule Commonly known as:  PRILOSEC Take 1 capsule (40 mg total) by mouth daily.   ondansetron 4 MG tablet Commonly known as:  ZOFRAN Take 1 tablet (4 mg total) by mouth every 8 (eight) hours as needed for nausea or vomiting.   polyethylene glycol packet Commonly known as:  MIRALAX / GLYCOLAX Take 17 g by mouth daily. What changed:    when to take this  reasons to take this  additional instructions   potassium chloride SA 20 MEQ tablet Commonly known as:  K-DUR,KLOR-CON Take 1 tablet (20 mEq total) by mouth daily. What changed:    when to take this  additional instructions   PROBIOTIC DAILY PO Take 1 tablet by mouth daily.   rosuvastatin 10 MG tablet Commonly known as:  CRESTOR TAKE 1 TABLET BY MOUTH ONCE DAILY   senna-docusate 8.6-50 MG tablet Commonly known as:  Senokot-S Take 2 tablets by mouth 2 (two) times daily. What changed:    how much to take  when to take this  additional instructions   sertraline 25 MG tablet Commonly known as:  ZOLOFT Take 1 tablet (25 mg total) by mouth daily.   torsemide 20 MG tablet Commonly known as:  DEMADEX Take 1 tablet (20 mg total) by mouth daily. Can take 20 mg extra as needed. What changed:    when to take this  additional instructions   traZODone 50 MG tablet Commonly known as:  DESYREL TAKE 1 TABLET(50 MG) BY MOUTH AT BEDTIME What changed:  See the new instructions.   vitamin  B-12 500 MCG tablet Commonly known as:  CYANOCOBALAMIN Take 500 mcg by mouth every other day.   VYNDAQEL 20 MG Caps Generic drug:  Tafamidis Meglumine (Cardiac) Take 80 mg by mouth daily. (4 capsules) What changed:  when to take this   zolpidem 5 MG tablet Commonly known as:  AMBIEN TAKE 1 TABLET BY MOUTH EVERY NIGHT AT BEDTIME AS NEEDED FOR SLEEP What changed:    when to take this  additional instructions      Follow-up Information    Bensimhon, Shaune Pascal, MD Follow up on 12/10/2017.   Specialty:  Cardiology Why:  2 pm heart failure follow up. garage code 1500. To be seen with repeat labs (CBC & BMP). Contact information: [place] [address] [address] up.   Specialty:  Home Health Services Why:  Resume HH services (RN/PT/HHA) and home IV milrinone infusion Contact information: [address] [place] [address] [phone]        Dillon Maw, MD. Schedule an appointment as soon as possible for a visit.   Specialty:  Family Medicine Contact information: [place] [address] [phone]        Leonie Man, MD .   Specialty:  Cardiology Contact information: [address] [place] [address] [phone]          No Known Allergies    Procedures/Studies: Dg Chest 2 View  Result Date: 12/02/2017 CLINICAL DATA:  Possible sepsis, fever, port placed last week, history of myeloma, CHF, stage III chronic kidney disease, hypertension, type II diabetes mellitus EXAM: CHEST - 2 VIEW COMPARISON:  11/17/2017 FINDINGS: RIGHT jugular central venous catheter with tip projecting over SVC. Enlargement of cardiac silhouette. Mediastinal contours and pulmonary vascularity normal. Atherosclerotic calcification aorta. LEFT lower lobe consolidation and small LEFT pleural effusion. RIGHT lung clear. No pneumothorax or acute  osseous findings. IMPRESSION: LEFT lower lobe consolidation and small LEFT pleural effusion. Electronically Signed   By: Lavonia Dana M.D.   On: 12/02/2017 19:20      Subjective: He states that he feels fine.  Denies complaints.  No cough, dyspnea, chest pain, palpitations, dizziness or lightheadedness.  Discharge Exam:  Vitals:   12/03/17 2017 12/03/17 2354 12/04/17 0526 12/04/17 0757  BP: 114/69 104/63 103/62 97/69  Pulse: 88 88 (!) 111 89  Resp: (!) 21 (!) 29 (!) 23 (!) 21  Temp: 98.7 F (37.1 C) 98.2 F (36.8 C) 98.2 F (36.8 C) 98.5 F (  36.9 C)  TempSrc: Oral Oral Oral Oral  SpO2: 97% 94% 95% 96%  Weight:   79.1 kg   Height:        General exam: Pleasant elderly male, moderately built and nourished lying comfortably propped up in bed.  Saturating normally on room air. Respiratory system: Occasional basal crackles but otherwise clear to auscultation. Respiratory effort normal. Cardiovascular system: S1 & S2 heard, irregularly irregular. No JVD, murmurs, rubs, gallops or clicks. No pedal edema.  Telemetry personally reviewed: A. fib, BBB morphology and controlled ventricular rate. Gastrointestinal system: Abdomen is nondistended, soft and nontender. No organomegaly or masses felt. Normal bowel sounds heard. Central nervous system: Alert and oriented. No focal neurological deficits. Extremities: Symmetric 5 x 5 power. Skin: No rashes, lesions or ulcers Psychiatry: Judgement and insight appear normal. Mood & affect appropriate.     The results of significant diagnostics from this hospitalization (including imaging, microbiology, ancillary and laboratory) are listed below for reference.     Microbiology: Recent Results (from the past 240 hour(s))  Culture, blood (Routine x 2)     Status: None (Preliminary result)   Collection Time: 12/02/17  6:54 PM  Result Value Ref Range Status   Specimen Description BLOOD LEFT ANTECUBITAL  Final   Special Requests   Final    BOTTLES  DRAWN AEROBIC AND ANAEROBIC Blood Culture adequate volume   Culture   Final    NO GROWTH 2 DAYS Performed at [facility], [address] [place], [place] [address]    Report Status PENDING  Incomplete  Culture, blood (Routine x 2)     Status: None (Preliminary result)   Collection Time: 12/02/17  6:56 PM  Result Value Ref Range Status   Specimen Description BLOOD RIGHT ANTECUBITAL  Final   Special Requests   Final    BOTTLES DRAWN AEROBIC AND ANAEROBIC Blood Culture adequate volume   Culture   Final    NO GROWTH 2 DAYS Performed at [facility], [place] [address] [place], [place] [address]    Report Status PENDING  Incomplete     Labs: CBC: Recent Labs  Lab 12/02/17 1850 12/03/17 0327  WBC 8.8 7.3  NEUTROABS 7.0  --   HGB 9.9* 9.1*  HCT 31.7* 29.3*  MCV 91.6 90.7  PLT 216 573   Basic Metabolic Panel: Recent Labs  Lab 12/02/17 1850 12/03/17 0327 12/03/17 1751 12/04/17 0500  NA 140 141 140 140  K 3.1* 2.9* 3.8 4.1  CL 96* 98 99 102  CO2 32 34* 32 32  GLUCOSE 137* 108* 118* 119*  BUN 24* 19 21 18   CREATININE 1.51* 1.44* 1.47* 1.32*  CALCIUM 8.1* 8.0* 8.1* 8.3*   Liver Function Tests: Recent Labs  Lab 12/02/17 1850  AST 35  ALT 25  ALKPHOS 140*  BILITOT 1.0  PROT 6.0*  ALBUMIN 2.8*   BNP (last 3 results) Recent Labs    09/15/17 1401 11/05/17 1310 12/02/17 1846  BNP 393.0* 515.3* 349.6*   CBG: Recent Labs  Lab 12/03/17 1112 12/03/17 1639 12/03/17 2048 12/04/17 0559 12/04/17 1140  GLUCAP 150* 107* 126* 119* 178*   Urinalysis    Component Value Date/Time   COLORURINE YELLOW 12/02/2017 1935   APPEARANCEUR CLEAR 12/02/2017 1935   LABSPEC 1.006 12/02/2017 1935   PHURINE 7.0 12/02/2017 1935   GLUCOSEU NEGATIVE 12/02/2017 1935   HGBUR NEGATIVE 12/02/2017 1935   BILIRUBINUR NEGATIVE 12/02/2017 1935   KETONESUR NEGATIVE 12/02/2017 1935   PROTEINUR NEGATIVE  12/02/2017 1935   UROBILINOGEN 1.0 06/06/2008 0949   NITRITE NEGATIVE  12/02/2017 1935   LEUKOCYTESUR NEGATIVE 12/02/2017 1935      Time coordinating discharge: 25 minutes  SIGNED:  Vernell Leep, MD, FACP, [facility]. [facility] Pager [phone]  If 7PM-7AM, please contact night-coverage [email or web address] Password [facility] 12/04/2017, 12:56 PM

## 2017-12-05 ENCOUNTER — Telehealth: Payer: Self-pay | Admitting: *Deleted

## 2017-12-05 ENCOUNTER — Encounter (HOSPITAL_COMMUNITY): Payer: Medicare HMO

## 2017-12-05 ENCOUNTER — Telehealth (HOSPITAL_COMMUNITY): Payer: Self-pay

## 2017-12-05 DIAGNOSIS — I429 Cardiomyopathy, unspecified: Secondary | ICD-10-CM | POA: Diagnosis not present

## 2017-12-05 DIAGNOSIS — E1122 Type 2 diabetes mellitus with diabetic chronic kidney disease: Secondary | ICD-10-CM | POA: Diagnosis not present

## 2017-12-05 DIAGNOSIS — I130 Hypertensive heart and chronic kidney disease with heart failure and stage 1 through stage 4 chronic kidney disease, or unspecified chronic kidney disease: Secondary | ICD-10-CM | POA: Diagnosis not present

## 2017-12-05 DIAGNOSIS — C9000 Multiple myeloma not having achieved remission: Secondary | ICD-10-CM | POA: Diagnosis not present

## 2017-12-05 DIAGNOSIS — K7460 Unspecified cirrhosis of liver: Secondary | ICD-10-CM | POA: Diagnosis not present

## 2017-12-05 DIAGNOSIS — N183 Chronic kidney disease, stage 3 (moderate): Secondary | ICD-10-CM | POA: Diagnosis not present

## 2017-12-05 DIAGNOSIS — D631 Anemia in chronic kidney disease: Secondary | ICD-10-CM | POA: Diagnosis not present

## 2017-12-05 DIAGNOSIS — I5043 Acute on chronic combined systolic (congestive) and diastolic (congestive) heart failure: Secondary | ICD-10-CM | POA: Diagnosis not present

## 2017-12-05 DIAGNOSIS — I4891 Unspecified atrial fibrillation: Secondary | ICD-10-CM | POA: Diagnosis not present

## 2017-12-05 NOTE — Telephone Encounter (Signed)
Unable to reach patient at time of TCM Call.  Phone rang on end. No answer, no voicemail.

## 2017-12-05 NOTE — Telephone Encounter (Signed)
[facility] nurse called to state pt has gained 1 lb overnight and vest 37%. [name] states that pt just got out of hospital and would like to know if she should start the diuretic protocol? Pt is not symptomatic. Please advise.

## 2017-12-05 NOTE — Telephone Encounter (Signed)
Please start protocol

## 2017-12-05 NOTE — Telephone Encounter (Signed)
Left VM for [facility].

## 2017-12-07 DIAGNOSIS — I4891 Unspecified atrial fibrillation: Secondary | ICD-10-CM | POA: Diagnosis not present

## 2017-12-07 DIAGNOSIS — I5043 Acute on chronic combined systolic (congestive) and diastolic (congestive) heart failure: Secondary | ICD-10-CM | POA: Diagnosis not present

## 2017-12-07 DIAGNOSIS — D631 Anemia in chronic kidney disease: Secondary | ICD-10-CM | POA: Diagnosis not present

## 2017-12-07 DIAGNOSIS — E1122 Type 2 diabetes mellitus with diabetic chronic kidney disease: Secondary | ICD-10-CM | POA: Diagnosis not present

## 2017-12-07 DIAGNOSIS — K7460 Unspecified cirrhosis of liver: Secondary | ICD-10-CM | POA: Diagnosis not present

## 2017-12-07 DIAGNOSIS — I429 Cardiomyopathy, unspecified: Secondary | ICD-10-CM | POA: Diagnosis not present

## 2017-12-07 DIAGNOSIS — I130 Hypertensive heart and chronic kidney disease with heart failure and stage 1 through stage 4 chronic kidney disease, or unspecified chronic kidney disease: Secondary | ICD-10-CM | POA: Diagnosis not present

## 2017-12-07 DIAGNOSIS — I509 Heart failure, unspecified: Secondary | ICD-10-CM | POA: Diagnosis not present

## 2017-12-07 DIAGNOSIS — N183 Chronic kidney disease, stage 3 (moderate): Secondary | ICD-10-CM | POA: Diagnosis not present

## 2017-12-07 DIAGNOSIS — C9000 Multiple myeloma not having achieved remission: Secondary | ICD-10-CM | POA: Diagnosis not present

## 2017-12-07 LAB — CULTURE, BLOOD (ROUTINE X 2)
CULTURE: NO GROWTH
Culture: NO GROWTH
Special Requests: ADEQUATE
Special Requests: ADEQUATE

## 2017-12-08 ENCOUNTER — Encounter (HOSPITAL_COMMUNITY): Payer: Medicare HMO

## 2017-12-09 ENCOUNTER — Telehealth (HOSPITAL_COMMUNITY): Payer: Self-pay | Admitting: *Deleted

## 2017-12-09 MED ORDER — POTASSIUM CHLORIDE CRYS ER 20 MEQ PO TBCR: 40.0000 meq | EXTENDED_RELEASE_TABLET | Freq: Every day | ORAL | 3 refills | Status: DC

## 2017-12-09 NOTE — Telephone Encounter (Signed)
Received lab results from [facility] and per Barrington Ellison, PA patient needs to increase daily potassium to 40 mEq.  I spoke with patient's daughter [name] and she is aware of the increase.  Medication sent to pharmacy.

## 2017-12-10 ENCOUNTER — Encounter (HOSPITAL_COMMUNITY): Payer: Medicare HMO

## 2017-12-10 ENCOUNTER — Encounter (HOSPITAL_COMMUNITY): Payer: Self-pay | Admitting: *Deleted

## 2017-12-10 ENCOUNTER — Ambulatory Visit (HOSPITAL_COMMUNITY)
Admission: RE | Admit: 2017-12-10 | Discharge: 2017-12-10 | Disposition: A | Discharge status: Home / Self Care | Payer: Medicare HMO | Source: Ambulatory Visit | Attending: Internal Medicine | Admitting: Internal Medicine

## 2017-12-10 ENCOUNTER — Other Ambulatory Visit (HOSPITAL_COMMUNITY): Payer: Self-pay | Admitting: *Deleted

## 2017-12-10 ENCOUNTER — Other Ambulatory Visit: Payer: Self-pay | Admitting: *Deleted

## 2017-12-10 VITALS — BP 100/57 | HR 86 | Wt 175.0 lb

## 2017-12-10 DIAGNOSIS — Z8249 Family history of ischemic heart disease and other diseases of the circulatory system: Secondary | ICD-10-CM | POA: Insufficient documentation

## 2017-12-10 DIAGNOSIS — I8500 Esophageal varices without bleeding: Secondary | ICD-10-CM | POA: Insufficient documentation

## 2017-12-10 DIAGNOSIS — R778 Other specified abnormalities of plasma proteins: Secondary | ICD-10-CM

## 2017-12-10 DIAGNOSIS — K7581 Nonalcoholic steatohepatitis (NASH): Secondary | ICD-10-CM | POA: Insufficient documentation

## 2017-12-10 DIAGNOSIS — M1990 Unspecified osteoarthritis, unspecified site: Secondary | ICD-10-CM | POA: Insufficient documentation

## 2017-12-10 DIAGNOSIS — C9000 Multiple myeloma not having achieved remission: Secondary | ICD-10-CM | POA: Diagnosis not present

## 2017-12-10 DIAGNOSIS — Z8379 Family history of other diseases of the digestive system: Secondary | ICD-10-CM | POA: Diagnosis not present

## 2017-12-10 DIAGNOSIS — E785 Hyperlipidemia, unspecified: Secondary | ICD-10-CM | POA: Insufficient documentation

## 2017-12-10 DIAGNOSIS — Z7901 Long term (current) use of anticoagulants: Secondary | ICD-10-CM | POA: Diagnosis not present

## 2017-12-10 DIAGNOSIS — J029 Acute pharyngitis, unspecified: Secondary | ICD-10-CM

## 2017-12-10 DIAGNOSIS — Z87891 Personal history of nicotine dependence: Secondary | ICD-10-CM | POA: Diagnosis not present

## 2017-12-10 DIAGNOSIS — Z85828 Personal history of other malignant neoplasm of skin: Secondary | ICD-10-CM | POA: Insufficient documentation

## 2017-12-10 DIAGNOSIS — K219 Gastro-esophageal reflux disease without esophagitis: Secondary | ICD-10-CM | POA: Diagnosis not present

## 2017-12-10 DIAGNOSIS — Z7984 Long term (current) use of oral hypoglycemic drugs: Secondary | ICD-10-CM | POA: Diagnosis not present

## 2017-12-10 DIAGNOSIS — K625 Hemorrhage of anus and rectum: Secondary | ICD-10-CM | POA: Insufficient documentation

## 2017-12-10 DIAGNOSIS — I428 Other cardiomyopathies: Secondary | ICD-10-CM | POA: Insufficient documentation

## 2017-12-10 DIAGNOSIS — Z86010 Personal history of colonic polyps: Secondary | ICD-10-CM | POA: Insufficient documentation

## 2017-12-10 DIAGNOSIS — Z803 Family history of malignant neoplasm of breast: Secondary | ICD-10-CM | POA: Insufficient documentation

## 2017-12-10 DIAGNOSIS — Z79899 Other long term (current) drug therapy: Secondary | ICD-10-CM | POA: Insufficient documentation

## 2017-12-10 DIAGNOSIS — I43 Cardiomyopathy in diseases classified elsewhere: Secondary | ICD-10-CM

## 2017-12-10 DIAGNOSIS — Z66 Do not resuscitate: Secondary | ICD-10-CM | POA: Diagnosis not present

## 2017-12-10 DIAGNOSIS — E854 Organ-limited amyloidosis: Secondary | ICD-10-CM

## 2017-12-10 DIAGNOSIS — E1122 Type 2 diabetes mellitus with diabetic chronic kidney disease: Secondary | ICD-10-CM | POA: Insufficient documentation

## 2017-12-10 DIAGNOSIS — N183 Chronic kidney disease, stage 3 (moderate): Secondary | ICD-10-CM | POA: Diagnosis not present

## 2017-12-10 DIAGNOSIS — Z7189 Other specified counseling: Secondary | ICD-10-CM

## 2017-12-10 DIAGNOSIS — I5042 Chronic combined systolic (congestive) and diastolic (congestive) heart failure: Secondary | ICD-10-CM

## 2017-12-10 DIAGNOSIS — I4892 Unspecified atrial flutter: Secondary | ICD-10-CM

## 2017-12-10 DIAGNOSIS — I130 Hypertensive heart and chronic kidney disease with heart failure and stage 1 through stage 4 chronic kidney disease, or unspecified chronic kidney disease: Secondary | ICD-10-CM | POA: Insufficient documentation

## 2017-12-10 MED ORDER — DRONABINOL 5 MG PO CAPS: 5.0000 mg | ORAL_CAPSULE | Freq: Two times a day (BID) | ORAL | 2 refills | Status: DC

## 2017-12-10 NOTE — Patient Instructions (Signed)
Your physician has recommended that you have a Cardioversion (DCCV). Electrical Cardioversion uses a jolt of electricity to your heart either through paddles or wired patches attached to your chest. This is a controlled, usually prescheduled, procedure. Defibrillation is done under light anesthesia in the hospital, and you usually go home the day of the procedure. This is done to get your heart back into a normal rhythm. You are not awake for the procedure. Please see the instruction sheet given to you today.  Your physician recommends that you schedule a follow-up appointment in: 2 months

## 2017-12-10 NOTE — Progress Notes (Signed)
Advanced Heart Failure Clinic Note   Referring Physician: PCP: [name], MD PCP-Cardiologist: Glenetta Hew, MD  Primary HF: Dr. [name]   HPI: Dillon Andrews is a 77 y.o. malewith a hx of chronic combined CHF due to NICM, CKD III,presumedNASH(per pt, never proven),esophageal varices per chart(prior glass of wine occasionally but no heavy alcohol),HTN, HLD, DM, Barrett's esophagus, GERD, anemia, RBBB  He was admitted 3/13-3/17/19 for A/C combined HF. While inpatient, SPEP and UPEP sent for concerns for cardiac amyloidosis. He diuresed with IV lasix and was transitioned to lasix 80 mg q am, 40 mg q pm. DC weight 180 lbs.   NICM by cath at [facility] 09/2016 with EF 40-45%. Echo 06/2017: EF 25-30%,NICM by cath at [facility] 09/2016 with EF 40-45%. Echo 06/2017: EF 25-30%,  His SPEP came back with m-spike 0.8% and he was referred to Dr Beryle Beams. UPEP with Bence Jones Protein positive; kappa type.   PYP scan completed on 3/20 strongly suggestive of transthyretin amyloidosis. Genetic testing negative.  Seen in clinic on 3/26 and transitioned from lasix to torsemide. Admitted 3/28-3/31 with URI and A/C HF. URI treated with antibiotics. Diuresed with IV lasix, then transitioned to torsemide 40 mg BID. Genetic testing sent.   Admitted 0/10-9/32/35 with A/C systolic HF. He was diuresed with IV lasix. He required milrinone for low output. He was in afib RVR and was cardioverted twice. He was treated for sepsis. He had AKI on CKD that resolved prior to discharge. He also had rectal bleeding and required 1 unit RBCs. Eliquis was held and then dose was reduced. Palliative met with him and he was made DNR/I. Discharged home with milrinone.   Admitted 8/20-8/22/19 with fever and presumed HCAP. PCT was negative. He did not need abx at discharge. HF team consulted. He was stable from HF perspective. Continued milrinone, torsemide, and tafamidis. He was back in rate controlled atrial flutter, but  was asymptomatic. DC weight 174 lbs.   He presents today for post hospital follow up. Overall doing well. Does laps around his house and has no SOB. His legs get weak and he has to sit and take breaks. He has been having BLE edema and has been taking additional 20 mg torsemide most afternoons. Has hospital bed now and sleeps at an incline. Rare dry cough. No fever or chills. No bleeding on eliquis. Has some dizziness with standing about once daily. Orthostatics negative today. Potassium was increased yesterday for hypokalemia on [facility] labs (not scanned in yet). [facility] RN coming 2x/week. PT starting soon. Weights ~173 lbs. Taking all medications. Limiting salt intake. Has room to improve on fluid restriction.   Review of systems complete and found to be negative unless listed in HPI.   Past Medical History:  Diagnosis Date  . Anemia 11/2016  . Arthritis    "some; hands/fingers" (06/25/2017)  . Atrial tachycardia ([place])   . Barrett's esophagus   . Basal cell carcinoma    "face, arms, hands" (06/25/2017)  . Chicken pox   . Cholelithiasis 11/2016   S/P LAP CHOLE  . Chronic combined systolic and diastolic CHF (congestive heart failure) ([place])   . CKD (chronic kidney disease), stage III ([place])   . Esophageal varices (HCC)    Secondary to NAFLD  . Family history of adverse reaction to anesthesia    "daughter's sat drop very low; last OR had to be reintubated" (06/25/2017)  . GERD (gastroesophageal reflux disease)   . Goals of care, counseling/discussion 10/22/2017  . High cholesterol   .  Hypertension   . Kappa light chain myeloma ([place]) 10/22/2017  . Mitral regurgitation   . NAFLD (nonalcoholic fatty liver disease)   . NICM (nonischemic cardiomyopathy) ([place])    Echo 06/2017: Severe LVH.  EF 25 to 30% with mild HK of the baseline after septal wall.  Moderate to severely dilated right atrium, moderately dilated RV. (Concern fr ? amyloidosis).  . NSVT (nonsustained ventricular tachycardia) ([place])   .  Pneumonia 09/2016  . RBBB   . Tricuspid regurgitation   . Type II diabetes mellitus ([place])     Current Outpatient Medications  Medication Sig Dispense Refill  . amiodarone (PACERONE) 200 MG tablet Take 1 tablet (200 mg total) by mouth 2 (two) times daily. 60 tablet 6  . apixaban (ELIQUIS) 2.5 MG TABS tablet Take 1 tablet (2.5 mg total) by mouth 2 (two) times daily. 60 tablet 0  . dronabinol (MARINOL) 2.5 MG capsule Take 1 capsule (2.5 mg total) by mouth 2 (two) times daily before lunch and supper. 60 capsule 0  . gabapentin (NEURONTIN) 800 MG tablet TAKE 1 TABLET BY MOUTH AT BEDTIME (Patient taking differently: Take 800 mg by mouth at bedtime. ) 90 tablet 1  . glimepiride (AMARYL) 1 MG tablet Take 1 mg by mouth daily.  0  . glucose blood test strip Use as instructed 100 each 12  . hydrocortisone (ANUSOL-HC) 25 MG suppository Place 1 suppository (25 mg total) rectally 2 (two) times daily. (Patient taking differently: Place 25 mg rectally 2 (two) times daily as needed for hemorrhoids. ) 12 suppository 0  . magnesium oxide (MAG-OX) 400 MG tablet Take 400 mg by mouth daily.    . Melatonin 5 MG TABS Take 5 mg by mouth at bedtime.     . milrinone (PRIMACOR) 20 MG/100 ML SOLN infusion Inject 0.0099 mg/min into the vein continuous. 100 mL 0  . omeprazole (PRILOSEC) 40 MG capsule Take 1 capsule (40 mg total) by mouth daily. 90 capsule 1  . ondansetron (ZOFRAN) 4 MG tablet Take 1 tablet (4 mg total) by mouth every 8 (eight) hours as needed for nausea or vomiting. 20 tablet 0  . polyethylene glycol (MIRALAX / GLYCOLAX) packet Take 17 g by mouth daily. (Patient taking differently: Take 17 g by mouth daily as needed (constipation). Mix with 8 oz orange juice and drink) 14 each 0  . potassium chloride SA (K-DUR,KLOR-CON) 20 MEQ tablet Take 2 tablets (40 mEq total) by mouth daily. 180 tablet 3  . Probiotic Product (PROBIOTIC DAILY PO) Take 1 tablet by mouth daily.     . rosuvastatin (CRESTOR) 10 MG tablet  TAKE 1 TABLET BY MOUTH ONCE DAILY (Patient taking differently: Take 10 mg by mouth daily. ) 90 tablet 0  . senna-docusate (SENOKOT-S) 8.6-50 MG tablet Take 2 tablets by mouth 2 (two) times daily. (Patient taking differently: Take 1 tablet by mouth See admin instructions. Take one tablet by mouth every morning, may take another tablet in the evening as needed for constipation) 20 tablet 0  . sertraline (ZOLOFT) 25 MG tablet Take 1 tablet (25 mg total) by mouth daily. 30 tablet 0  . torsemide (DEMADEX) 20 MG tablet Take 1 tablet (20 mg total) by mouth daily. Can take 20 mg extra as needed. (Patient taking differently: Take 20 mg by mouth See admin instructions. Take one tablet (20 mg) by mouth every morning, may also take one tablet in the afternoon as needed for swelling) 40 tablet 3  . traZODone (DESYREL) 50 MG tablet  TAKE 1 TABLET(50 MG) BY MOUTH AT BEDTIME (Patient taking differently: Take 50 mg by mouth at bedtime. ) 90 tablet 0  . vitamin B-12 (CYANOCOBALAMIN) 500 MCG tablet Take 500 mcg by mouth every other day.    Marland Kitchen VYNDAQEL 20 MG CAPS Take 80 mg by mouth daily. (4 capsules) (Patient taking differently: Take 80 mg by mouth every evening. (4 capsules)) 120 capsule   . zolpidem (AMBIEN) 5 MG tablet TAKE 1 TABLET BY MOUTH EVERY NIGHT AT BEDTIME AS NEEDED FOR SLEEP (Patient taking differently: Take 5 mg by mouth at bedtime. ) 30 tablet 1   No current facility-administered medications for this encounter.     No Known Allergies    Social History   Socioeconomic History  . Marital status: Married    Spouse name: Not on file  . Number of children: 4  . Years of education: 29  . Highest education level: Not on file  Occupational History  . Occupation: retired  Scientific laboratory technician  . Financial resource strain: Not on file  . Food insecurity:    Worry: Not on file    Inability: Not on file  . Transportation needs:    Medical: Not on file    Non-medical: Not on file  Tobacco Use  . Smoking  status: Former Smoker    Packs/day: 0.30    Years: 15.00    Pack years: 4.50    Types: Cigarettes    Last attempt to quit: 03/17/1977    Years since quitting: 40.7  . Smokeless tobacco: Never Used  Substance and Sexual Activity  . Alcohol use: Yes    Comment: 06/25/2017 "nothing since 09/2016;  used to have glass of wine q hs"  . Drug use: No  . Sexual activity: Not on file  Lifestyle  . Physical activity:    Days per week: Not on file    Minutes per session: Not on file  . Stress: Not on file  Relationships  . Social connections:    Talks on phone: Not on file    Gets together: Not on file    Attends religious service: Not on file    Active member of club or organization: Not on file    Attends meetings of clubs or organizations: Not on file    Relationship status: Not on file  . Intimate partner violence:    Fear of current or ex partner: Not on file    Emotionally abused: Not on file    Physically abused: Not on file    Forced sexual activity: Not on file  Other Topics Concern  . Not on file  Social History Narrative   He is retired from [employer].  Previously worked for [employer] by his daughter --> also a patient of Dr. Ellyn Hack   Lives with wife in a one story home.    Education: high school.       Family History  Problem Relation Age of Onset  . Heart failure Mother   . Cancer Father   . Cataracts Father   . Breast cancer Sister   . Alcohol abuse Brother   . Liver disease Brother   . Autoimmune disease Daughter        Bechet's syndrome  . Cirrhosis Neg Hx    Vitals:   12/10/17 1413  BP: (!) 100/57  Pulse: 86  SpO2: 100%  Weight: 79.4 kg (175 lb)   Orthostatics: Sitting: 98/68 Standing: 112/70  Wt Readings from  Last 3 Encounters:  12/10/17 79.4 kg (175 lb)  12/04/17 79.1 kg (174 lb 6.1 oz)  11/27/17 79.2 kg (174 lb 9.7 oz)    PHYSICAL EXAM: General: Elderly Well appearing. No resp difficulty. Walked into clinic with  walker.  HEENT: Normal anicteric  Neck: Supple. JVP ~7. Carotids 2+ bilat; no bruits. No thyromegaly or nodule noted. Cor: PMI nondisplaced. IRR 2/6 TR  Right chest tunneled PICC CDI Lungs: diminished in bases no wheeze  Abdomen: Soft, non-tender, non-distended, no HSM. No bruits or masses. +BS  Extremities: no cyanosis, clubbing, rash, edema Neuro: alert & oriented x 3, cranial nerves grossly intact. moves all 4 extremities w/o difficulty. Affect pleasant   EKG: Atrial flutter 85 bpm with RBBB (QRS 184 ms). Personally reviewed.   ASSESSMENT & PLAN:   1. 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  - Echo 06/2017: EF 25-30%, mild LAE, mild/mod RV dilation, mod/severe RA. -PYP scan strongly suggestive of TTR amyloid, SPEP with 0.8% m-spike, UPEP with Bence Jones Protein positive, kappa type. 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 - Dr Marin Olp saw and thinks he does not have AL amyloid (just multiple myeloma) and that he has TTR amyloid and should be treated with tafamidis before chemo. Tafamidis started on 8/2.  -Volume status stable on exam.  - Continue milrinone 0.125 mcg. Managed by [facility] - Continue torsemide 20 mg daily. He can take an extra 20 mg as needed.  - Continue tafamidis 80 mg daily - No ARB/entresto/spiro with for now with CKD - No BB with low output.  - SBP soft. Hold off on hydralazine/imdur - Discussed limiting fluid and salt intake.   2. CKD III, baseline ~1.6 - Had labs on Sunday through [facility]. Not yet scanned in.   3. Atrial Flutter s/p DCCV 7/31, 8/2 - Continue amio 200 mg BID - Continue Eliquis 2.5 mg BID - EKG shows atrial flutter today.  - Will attempt cardioverting once more. Will keep him on amio 200 mg BID and setup for DCCV next week.   4. HL - Continue crestor.   5. [place] with Dr. Marin Olp  -Started on Bortezomib7/18/18andRevlimid7/23/19. - Dr Marin Olp  thinks he does not have AL amyloid and that he does have TTR amyloid and should be treated with tafamodis before chemo. Now on Tafamidis.  - Encouraged them to follow up with Dr Marin Olp  6. NASH with esophageal varices.  - [facility]/8/2016at [facility] surveillance of Esophageal varices, Barrett's esophagus, and PMHx of Colon polyps. Showed two columns of small esophageal varices w/out stigmata, short segment Barrett's esophagus (without biopsies, due to presence of EVs), 5cm hiatal hernia, and hyperplastic small gastric nodules.  - Continue low dose eliquis 2.5 mg BID for afib.  7. Hx of Rectal bleeding - EGD/Colon deferred with recent (2017) showing diverticulosis and hemorrhoids. - Denies bleeding.   DCCV next week Follow up in 2 months  Georgiana Shore, NP 12/10/17   Patient seen and examined with the above-signed Advanced Practice Provider and/or Housestaff. I personally reviewed laboratory data, imaging studies and relevant notes. I independently examined the patient and formulated the important aspects of the plan. I have edited the note to reflect any of my changes or salient points. I have personally discussed the plan with the patient and/or family.  Doing quite well with milrinone support. Volume status looks ok. BP soft but stable. Orthostatics negative today so  no need for midodrine. Remains in AFL (better tolerated than AF). Will continue amio and Eliquis. Arrange DC_CV for next week. Continue tafamadis for TTR amyloid. According to Dr. Marin Olp he feels that cardiac amyloid is likely TTR and not AL amyloid. Will defer treatment plan for myeloma to his judgment.   Glori Bickers, MD  6:01 PM

## 2017-12-10 NOTE — H&P (View-Only) (Signed)
Advanced Heart Failure Clinic Note   Referring Physician: PCP: Kremer, William Alfred, MD PCP-Cardiologist: David Harding, MD  Primary HF: Dr. Lashona Schaaf   HPI: Dillon Andrews is a 77 y.o. malewith a hx of chronic combined CHF due to NICM, CKD III,presumedNASH(per pt, never proven),esophageal varices per chart(prior glass of wine occasionally but no heavy alcohol),HTN, HLD, DM, Barrett's esophagus, GERD, anemia, RBBB  He was admitted 3/13-3/17/19 for A/C combined HF. While inpatient, SPEP and UPEP sent for concerns for cardiac amyloidosis. He diuresed with IV lasix and was transitioned to lasix 80 mg q am, 40 mg q pm. DC weight 180 lbs.   NICM by cath at [facility] 09/2016 with EF 40-45%. Echo 06/2017: EF 25-30%,NICM by cath at [facility] 09/2016 with EF 40-45%. Echo 06/2017: EF 25-30%,  His SPEP came back with m-spike 0.8% and he was referred to Dr Granfortuna. UPEP with Bence Jones Protein positive; kappa type.   PYP scan completed on 3/20 strongly suggestive of transthyretin amyloidosis. Genetic testing negative.  Seen in clinic on 3/26 and transitioned from lasix to torsemide. Admitted 3/28-3/31 with URI and A/C HF. URI treated with antibiotics. Diuresed with IV lasix, then transitioned to torsemide 40 mg BID. Genetic testing sent.   Admitted 7/24-8/15/19 with A/C systolic HF. He was diuresed with IV lasix. He required milrinone for low output. He was in afib RVR and was cardioverted twice. He was treated for sepsis. He had AKI on CKD that resolved prior to discharge. He also had rectal bleeding and required 1 unit RBCs. Eliquis was held and then dose was reduced. Palliative met with him and he was made DNR/I. Discharged home with milrinone.   Admitted 8/20-8/22/19 with fever and presumed HCAP. PCT was negative. He did not need abx at discharge. HF team consulted. He was stable from HF perspective. Continued milrinone, torsemide, and tafamidis. He was back in rate controlled atrial flutter, but  was asymptomatic. DC weight 174 lbs.   He presents today for post hospital follow up. Overall doing well. Does laps around his house and has no SOB. His legs get weak and he has to sit and take breaks. He has been having BLE edema and has been taking additional 20 mg torsemide most afternoons. Has hospital bed now and sleeps at an incline. Rare dry cough. No fever or chills. No bleeding on eliquis. Has some dizziness with standing about once daily. Orthostatics negative today. Potassium was increased yesterday for hypokalemia on [facility] labs (not scanned in yet). [facility] RN coming 2x/week. PT starting soon. Weights ~173 lbs. Taking all medications. Limiting salt intake. Has room to improve on fluid restriction.   Review of systems complete and found to be negative unless listed in HPI.   Past Medical History:  Diagnosis Date  . Anemia 11/2016  . Arthritis    "some; hands/fingers" (06/25/2017)  . Atrial tachycardia (HCC)   . Barrett's esophagus   . Basal cell carcinoma    "face, arms, hands" (06/25/2017)  . Chicken pox   . Cholelithiasis 11/2016   S/P LAP CHOLE  . Chronic combined systolic and diastolic CHF (congestive heart failure) (HCC)   . CKD (chronic kidney disease), stage III (HCC)   . Esophageal varices (HCC)    Secondary to NAFLD  . Family history of adverse reaction to anesthesia    "daughter's sat drop very low; last OR had to be reintubated" (06/25/2017)  . GERD (gastroesophageal reflux disease)   . Goals of care, counseling/discussion 10/22/2017  . High cholesterol   .   Hypertension   . Kappa light chain myeloma (HCC) 10/22/2017  . Mitral regurgitation   . NAFLD (nonalcoholic fatty liver disease)   . NICM (nonischemic cardiomyopathy) (HCC)    Echo 06/2017: Severe LVH.  EF 25 to 30% with mild HK of the baseline after septal wall.  Moderate to severely dilated right atrium, moderately dilated RV. (Concern fr ? amyloidosis).  . NSVT (nonsustained ventricular tachycardia) (HCC)   .  Pneumonia 09/2016  . RBBB   . Tricuspid regurgitation   . Type II diabetes mellitus (HCC)     Current Outpatient Medications  Medication Sig Dispense Refill  . amiodarone (PACERONE) 200 MG tablet Take 1 tablet (200 mg total) by mouth 2 (two) times daily. 60 tablet 6  . apixaban (ELIQUIS) 2.5 MG TABS tablet Take 1 tablet (2.5 mg total) by mouth 2 (two) times daily. 60 tablet 0  . dronabinol (MARINOL) 2.5 MG capsule Take 1 capsule (2.5 mg total) by mouth 2 (two) times daily before lunch and supper. 60 capsule 0  . gabapentin (NEURONTIN) 800 MG tablet TAKE 1 TABLET BY MOUTH AT BEDTIME (Patient taking differently: Take 800 mg by mouth at bedtime. ) 90 tablet 1  . glimepiride (AMARYL) 1 MG tablet Take 1 mg by mouth daily.  0  . glucose blood test strip Use as instructed 100 each 12  . hydrocortisone (ANUSOL-HC) 25 MG suppository Place 1 suppository (25 mg total) rectally 2 (two) times daily. (Patient taking differently: Place 25 mg rectally 2 (two) times daily as needed for hemorrhoids. ) 12 suppository 0  . magnesium oxide (MAG-OX) 400 MG tablet Take 400 mg by mouth daily.    . Melatonin 5 MG TABS Take 5 mg by mouth at bedtime.     . milrinone (PRIMACOR) 20 MG/100 ML SOLN infusion Inject 0.0099 mg/min into the vein continuous. 100 mL 0  . omeprazole (PRILOSEC) 40 MG capsule Take 1 capsule (40 mg total) by mouth daily. 90 capsule 1  . ondansetron (ZOFRAN) 4 MG tablet Take 1 tablet (4 mg total) by mouth every 8 (eight) hours as needed for nausea or vomiting. 20 tablet 0  . polyethylene glycol (MIRALAX / GLYCOLAX) packet Take 17 g by mouth daily. (Patient taking differently: Take 17 g by mouth daily as needed (constipation). Mix with 8 oz orange juice and drink) 14 each 0  . potassium chloride SA (K-DUR,KLOR-CON) 20 MEQ tablet Take 2 tablets (40 mEq total) by mouth daily. 180 tablet 3  . Probiotic Product (PROBIOTIC DAILY PO) Take 1 tablet by mouth daily.     . rosuvastatin (CRESTOR) 10 MG tablet  TAKE 1 TABLET BY MOUTH ONCE DAILY (Patient taking differently: Take 10 mg by mouth daily. ) 90 tablet 0  . senna-docusate (SENOKOT-S) 8.6-50 MG tablet Take 2 tablets by mouth 2 (two) times daily. (Patient taking differently: Take 1 tablet by mouth See admin instructions. Take one tablet by mouth every morning, may take another tablet in the evening as needed for constipation) 20 tablet 0  . sertraline (ZOLOFT) 25 MG tablet Take 1 tablet (25 mg total) by mouth daily. 30 tablet 0  . torsemide (DEMADEX) 20 MG tablet Take 1 tablet (20 mg total) by mouth daily. Can take 20 mg extra as needed. (Patient taking differently: Take 20 mg by mouth See admin instructions. Take one tablet (20 mg) by mouth every morning, may also take one tablet in the afternoon as needed for swelling) 40 tablet 3  . traZODone (DESYREL) 50 MG tablet   TAKE 1 TABLET(50 MG) BY MOUTH AT BEDTIME (Patient taking differently: Take 50 mg by mouth at bedtime. ) 90 tablet 0  . vitamin B-12 (CYANOCOBALAMIN) 500 MCG tablet Take 500 mcg by mouth every other day.    . VYNDAQEL 20 MG CAPS Take 80 mg by mouth daily. (4 capsules) (Patient taking differently: Take 80 mg by mouth every evening. (4 capsules)) 120 capsule   . zolpidem (AMBIEN) 5 MG tablet TAKE 1 TABLET BY MOUTH EVERY NIGHT AT BEDTIME AS NEEDED FOR SLEEP (Patient taking differently: Take 5 mg by mouth at bedtime. ) 30 tablet 1   No current facility-administered medications for this encounter.     No Known Allergies    Social History   Socioeconomic History  . Marital status: Married    Spouse name: Not on file  . Number of children: 4  . Years of education: 12  . Highest education level: Not on file  Occupational History  . Occupation: retired  Social Needs  . Financial resource strain: Not on file  . Food insecurity:    Worry: Not on file    Inability: Not on file  . Transportation needs:    Medical: Not on file    Non-medical: Not on file  Tobacco Use  . Smoking  status: Former Smoker    Packs/day: 0.30    Years: 15.00    Pack years: 4.50    Types: Cigarettes    Last attempt to quit: 03/17/1977    Years since quitting: 40.7  . Smokeless tobacco: Never Used  Substance and Sexual Activity  . Alcohol use: Yes    Comment: 06/25/2017 "nothing since 09/2016;  used to have glass of wine q hs"  . Drug use: No  . Sexual activity: Not on file  Lifestyle  . Physical activity:    Days per week: Not on file    Minutes per session: Not on file  . Stress: Not on file  Relationships  . Social connections:    Talks on phone: Not on file    Gets together: Not on file    Attends religious service: Not on file    Active member of club or organization: Not on file    Attends meetings of clubs or organizations: Not on file    Relationship status: Not on file  . Intimate partner violence:    Fear of current or ex partner: Not on file    Emotionally abused: Not on file    Physically abused: Not on file    Forced sexual activity: Not on file  Other Topics Concern  . Not on file  Social History Narrative   He is retired from landscaping business.  Previously worked for [employer]   Accompanied by his daughter --> also a patient of Dr. Harding   Lives with wife in a one story home.    Education: high school.       Family History  Problem Relation Age of Onset  . Heart failure Mother   . Cancer Father   . Cataracts Father   . Breast cancer Sister   . Alcohol abuse Brother   . Liver disease Brother   . Autoimmune disease Daughter        Bechet's syndrome  . Cirrhosis Neg Hx    Vitals:   12/10/17 1413  BP: (!) 100/57  Pulse: 86  SpO2: 100%  Weight: 79.4 kg (175 lb)   Orthostatics: Sitting: 98/68 Standing: 112/70  Wt Readings from   Last 3 Encounters:  12/10/17 79.4 kg (175 lb)  12/04/17 79.1 kg (174 lb 6.1 oz)  11/27/17 79.2 kg (174 lb 9.7 oz)    PHYSICAL EXAM: General: Elderly Well appearing. No resp difficulty. Walked into clinic with  walker.  HEENT: Normal anicteric  Neck: Supple. JVP ~7. Carotids 2+ bilat; no bruits. No thyromegaly or nodule noted. Cor: PMI nondisplaced. IRR 2/6 TR  Right chest tunneled PICC CDI Lungs: diminished in bases no wheeze  Abdomen: Soft, non-tender, non-distended, no HSM. No bruits or masses. +BS  Extremities: no cyanosis, clubbing, rash, edema Neuro: alert & oriented x 3, cranial nerves grossly intact. moves all 4 extremities w/o difficulty. Affect pleasant   EKG: Atrial flutter 85 bpm with RBBB (QRS 184 ms). Personally reviewed.   ASSESSMENT & PLAN:   1. 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  - Echo 06/2017: EF 25-30%, mild LAE, mild/mod RV dilation, mod/severe RA. -PYP scan strongly suggestive of TTR amyloid, SPEP with 0.8% m-spike, UPEP with Bence Jones Protein positive, kappa type. 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 - Dr Ennever saw and thinks he does not have AL amyloid (just multiple myeloma) and that he has TTR amyloid and should be treated with tafamidis before chemo. Tafamidis started on 8/2.  -Volume status stable on exam.  - Continue milrinone 0.125 mcg. Managed by [facility] - Continue torsemide 20 mg daily. He can take an extra 20 mg as needed.  - Continue tafamidis 80 mg daily - No ARB/entresto/spiro with for now with CKD - No BB with low output.  - SBP soft. Hold off on hydralazine/imdur - Discussed limiting fluid and salt intake.   2. CKD III, baseline ~1.6 - Had labs on Sunday through [facility]. Not yet scanned in.   3. Atrial Flutter s/p DCCV 7/31, 8/2 - Continue amio 200 mg BID - Continue Eliquis 2.5 mg BID - EKG shows atrial flutter today.  - Will attempt cardioverting once more. Will keep him on amio 200 mg BID and setup for DCCV next week.   4. HL - Continue crestor.   5. Kappa Lightchain Myeloma - Follows with Dr. Ennever  -Started on Bortezomib7/18/18andRevlimid7/23/19. - Dr Ennever  thinks he does not have AL amyloid and that he does have TTR amyloid and should be treated with tafamodis before chemo. Now on Tafamidis.  - Encouraged them to follow up with Dr Ennever  6. NASH with esophageal varices.  - [facility]'s esophagus, and PMHx of Colon polyps. Showed two columns of small esophageal varices w/out stigmata, short segment Barrett's esophagus (without biopsies, due to presence of EVs), 5cm hiatal hernia, and hyperplastic small gastric nodules.  - Continue low dose eliquis 2.5 mg BID for afib.  7. Hx of Rectal bleeding - EGD/Colon deferred with recent (2017) showing diverticulosis and hemorrhoids. - Denies bleeding.   DCCV next week Follow up in 2 months  Ashley M Smith, NP 12/10/17   Patient seen and examined with the above-signed Advanced Practice Provider and/or Housestaff. I personally reviewed laboratory data, imaging studies and relevant notes. I independently examined the patient and formulated the important aspects of the plan. I have edited the note to reflect any of my changes or salient points. I have personally discussed the plan with the patient and/or family.  Doing quite well with milrinone support. Volume status looks ok. BP soft but stable. Orthostatics negative today so   no need for midodrine. Remains in AFL (better tolerated than AF). Will continue amio and Eliquis. Arrange DC_CV for next week. Continue tafamadis for TTR amyloid. According to Dr. Ennever he feels that cardiac amyloid is likely TTR and not AL amyloid. Will defer treatment plan for myeloma to his judgment.   Catelynn Sparger, MD  6:01 PM   

## 2017-12-11 ENCOUNTER — Inpatient Hospital Stay: Payer: Medicare HMO

## 2017-12-11 DIAGNOSIS — I4891 Unspecified atrial fibrillation: Secondary | ICD-10-CM | POA: Diagnosis not present

## 2017-12-11 DIAGNOSIS — E119 Type 2 diabetes mellitus without complications: Secondary | ICD-10-CM | POA: Diagnosis not present

## 2017-12-11 DIAGNOSIS — I130 Hypertensive heart and chronic kidney disease with heart failure and stage 1 through stage 4 chronic kidney disease, or unspecified chronic kidney disease: Secondary | ICD-10-CM | POA: Diagnosis not present

## 2017-12-11 DIAGNOSIS — N183 Chronic kidney disease, stage 3 (moderate): Secondary | ICD-10-CM | POA: Diagnosis not present

## 2017-12-11 DIAGNOSIS — I429 Cardiomyopathy, unspecified: Secondary | ICD-10-CM | POA: Diagnosis not present

## 2017-12-11 DIAGNOSIS — E1122 Type 2 diabetes mellitus with diabetic chronic kidney disease: Secondary | ICD-10-CM | POA: Diagnosis not present

## 2017-12-11 DIAGNOSIS — I5043 Acute on chronic combined systolic (congestive) and diastolic (congestive) heart failure: Secondary | ICD-10-CM | POA: Diagnosis not present

## 2017-12-11 DIAGNOSIS — D631 Anemia in chronic kidney disease: Secondary | ICD-10-CM | POA: Diagnosis not present

## 2017-12-11 DIAGNOSIS — C9000 Multiple myeloma not having achieved remission: Secondary | ICD-10-CM | POA: Diagnosis not present

## 2017-12-11 DIAGNOSIS — K7460 Unspecified cirrhosis of liver: Secondary | ICD-10-CM | POA: Diagnosis not present

## 2017-12-11 NOTE — Telephone Encounter (Signed)
Transition Care Management Follow-up Telephone Call  PCP: [name], MD  Admit date: 12/02/2017 Discharge date: 12/04/2017  Recommendations for Outpatient Follow-up:  1. Dr. Glori Bickers, Cardiology on 12/10/2017 at 2 PM.  To be seen with repeat labs (CBC & BMP). 2. Dr. [name], PCP 3. Dr. Glenetta Hew, Cardiology  Home Health: Resume home health services (RN/PT/home health aide) and home IV milrinone infusion Equipment/Devices: None  Discharge Condition: Improved and stable   How have you been since you were released from the hospital? Patient stated, "I'm feeling pretty good, getting stronger each day".   Do you understand why you were in the hospital? yes   Do you understand the discharge instructions? yes   Where were you discharged to? Home with spouse.   Items Reviewed:  Medications reviewed: yes  Allergies reviewed: yes, NKA  Dietary changes reviewed: yes, patient voiced consuming a low sodium, heart healthy diet.  Referrals reviewed: yes, Dr. Glori Bickers, Cardiology on 12/10/2017 at 2 PM; Dr. [name], PCP; Resume home health services (RN/PT/home health aide) and home IV milrinone infusion.   Functional Questionnaire:   Activities of Daily Living (ADLs):   He states they are independent in the following: ambulation, bathing and hygiene, feeding, continence, grooming, toileting and dressing States they require assistance with the following: None   Any transportation issues/concerns?: no   Any patient concerns? no   Confirmed importance and date/time of follow-up visits scheduled yes, 12/12/17 at 11:30 AM.  Provider Appointment booked with Dr. Ethelene Hal.  Confirmed with patient if condition begins to worsen call PCP or go to the ER.  Patient was given the office number and encouraged to call back with question or concerns.  : yes

## 2017-12-12 ENCOUNTER — Encounter (HOSPITAL_COMMUNITY): Payer: Medicare HMO

## 2017-12-12 ENCOUNTER — Ambulatory Visit (INDEPENDENT_AMBULATORY_CARE_PROVIDER_SITE_OTHER): Payer: Medicare HMO | Admitting: Family Medicine

## 2017-12-12 ENCOUNTER — Encounter: Payer: Self-pay | Admitting: Family Medicine

## 2017-12-12 ENCOUNTER — Telehealth (HOSPITAL_COMMUNITY): Payer: Self-pay | Admitting: Cardiology

## 2017-12-12 VITALS — BP 102/60 | HR 100 | Temp 97.8°F | Ht 69.0 in | Wt 176.4 lb

## 2017-12-12 DIAGNOSIS — N183 Chronic kidney disease, stage 3 unspecified: Secondary | ICD-10-CM

## 2017-12-12 DIAGNOSIS — E1122 Type 2 diabetes mellitus with diabetic chronic kidney disease: Secondary | ICD-10-CM | POA: Diagnosis not present

## 2017-12-12 DIAGNOSIS — Z09 Encounter for follow-up examination after completed treatment for conditions other than malignant neoplasm: Secondary | ICD-10-CM

## 2017-12-12 DIAGNOSIS — D649 Anemia, unspecified: Secondary | ICD-10-CM

## 2017-12-12 LAB — CBC
HCT: 33.9 % — ABNORMAL LOW (ref 39.0–52.0)
HEMOGLOBIN: 10.8 g/dL — AB (ref 13.0–17.0)
MCHC: 31.7 g/dL (ref 30.0–36.0)
MCV: 92.6 fl (ref 78.0–100.0)
Platelets: 195 10*3/uL (ref 150.0–400.0)
RBC: 3.67 Mil/uL — ABNORMAL LOW (ref 4.22–5.81)
RDW: 24.2 % — AB (ref 11.5–15.5)
WBC: 6.8 10*3/uL (ref 4.0–10.5)

## 2017-12-12 LAB — BASIC METABOLIC PANEL
BUN: 36 mg/dL — AB (ref 6–23)
CO2: 37 mEq/L — ABNORMAL HIGH (ref 19–32)
Calcium: 9.4 mg/dL (ref 8.4–10.5)
Chloride: 98 mEq/L (ref 96–112)
Creatinine, Ser: 1.68 mg/dL — ABNORMAL HIGH (ref 0.40–1.50)
GFR: 42.18 mL/min — AB (ref >60.00)
Glucose, Bld: 84 mg/dL (ref 70–99)
POTASSIUM: 4.6 meq/L (ref 3.5–5.1)
SODIUM: 142 meq/L (ref 135–145)

## 2017-12-12 LAB — IRON,TIBC AND FERRITIN PANEL
%SAT: 15 % — AB (ref 20–48)
FERRITIN: 404 ng/mL — AB (ref 24–380)
Iron: 50 ug/dL (ref 50–180)
TIBC: 329 mcg/dL (calc) (ref 250–425)

## 2017-12-12 LAB — HEMOGLOBIN A1C: Hgb A1c MFr Bld: 6.7 % — ABNORMAL HIGH (ref 4.6–6.5)

## 2017-12-12 LAB — VITAMIN B12: Vitamin B-12: 1001 pg/mL — ABNORMAL HIGH (ref 211–911)

## 2017-12-12 MED ORDER — POTASSIUM CHLORIDE CRYS ER 20 MEQ PO TBCR: 40.0000 meq | EXTENDED_RELEASE_TABLET | Freq: Two times a day (BID) | ORAL | 3 refills | Status: DC

## 2017-12-12 NOTE — Telephone Encounter (Signed)
Abnormal lab results received from [place] drawn 12/07/17 Cr 1.49 K 3.4 BUN 25  Per Rebecca Eaton Increase potassium to 40 meq BID Repeat labs in 7-10 days  Pt aware and voiced understanding Order sent for repeat labs x 7-10 days

## 2017-12-12 NOTE — Progress Notes (Addendum)
Subjective:  Patient ID: Dillon Andrews, male    DOB: [birth year]  Age: 77 y.o. MRN: [id number]  CC: Hospitalization Follow-up   HPI Dillon Andrews presents for follow-up of his anemia and diabetes status post his recent hospitalization for multiple cardiac issues.  Currently being treated for cardiac amyloidosis, atrial flutter, heart failure and atrial fibrillation.  Continues amiodarone and milrinone.  Failed recent cardioversion.  Continues to thematous for now.  Blood sugars have been elevated in the hospital setting.  He was sedentary and 3 meals a day.  Blood sugars at home have been in the less than 130 range fasting.  Patient has lost some 30 pounds over the last several months.  Patient experienced acute blood loss associated with constipation and a subsequent rupture of a hemorrhoid.  He was treated with transfusion of 1 unit and IV iron therapy.  He was given Amaryl on discharge.  His appetite has been poor and he has not been taking it.  He is taking Marinol 5 mg twice daily and supplementing with low carbohydrate protein shakes.  GFR is been averaging in the less than 60 range.  He is accompanied by his daughter Dillon Andrews today.  Outpatient Medications Prior to Visit  Medication Sig Dispense Refill  . amiodarone (PACERONE) 200 MG tablet Take 1 tablet (200 mg total) by mouth 2 (two) times daily. 60 tablet 6  . apixaban (ELIQUIS) 2.5 MG TABS tablet Take 1 tablet (2.5 mg total) by mouth 2 (two) times daily. 60 tablet 0  . dronabinol (MARINOL) 5 MG capsule Take 1 capsule (5 mg total) by mouth 2 (two) times daily before lunch and supper. 60 capsule 2  . gabapentin (NEURONTIN) 800 MG tablet TAKE 1 TABLET BY MOUTH AT BEDTIME (Patient taking differently: Take 800 mg by mouth at bedtime. ) 90 tablet 1  . glucose blood test strip Use as instructed 100 each 12  . hydrocortisone (ANUSOL-HC) 25 MG suppository Place 1 suppository (25 mg total) rectally 2 (two) times daily. (Patient taking  differently: Place 25 mg rectally 2 (two) times daily as needed for hemorrhoids. ) 12 suppository 0  . magnesium oxide (MAG-OX) 400 MG tablet Take 400 mg by mouth daily.    . Melatonin 5 MG TABS Take 5 mg by mouth at bedtime.     . milrinone (PRIMACOR) 20 MG/100 ML SOLN infusion Inject 0.0099 mg/min into the vein continuous. 100 mL 0  . omeprazole (PRILOSEC) 40 MG capsule Take 1 capsule (40 mg total) by mouth daily. 90 capsule 1  . ondansetron (ZOFRAN) 4 MG tablet Take 1 tablet (4 mg total) by mouth every 8 (eight) hours as needed for nausea or vomiting. 20 tablet 0  . polyethylene glycol (MIRALAX / GLYCOLAX) packet Take 17 g by mouth daily. (Patient taking differently: Take 17 g by mouth daily as needed (constipation). Mix with 8 oz orange juice and drink) 14 each 0  . Probiotic Product (PROBIOTIC DAILY PO) Take 1 tablet by mouth daily.     . rosuvastatin (CRESTOR) 10 MG tablet TAKE 1 TABLET BY MOUTH ONCE DAILY (Patient taking differently: Take 10 mg by mouth daily. ) 90 tablet 0  . senna-docusate (SENOKOT-S) 8.6-50 MG tablet Take 2 tablets by mouth 2 (two) times daily. (Patient taking differently: Take 1 tablet by mouth See admin instructions. Take one tablet by mouth every morning, may take another tablet in the evening as needed for constipation) 20 tablet 0  . sertraline (ZOLOFT) 25 MG tablet Take  1 tablet (25 mg total) by mouth daily. 30 tablet 0  . torsemide (DEMADEX) 20 MG tablet Take 1 tablet (20 mg total) by mouth daily. Can take 20 mg extra as needed. (Patient taking differently: Take 20 mg by mouth See admin instructions. Take one tablet (20 mg) by mouth every morning, may also take one tablet in the afternoon as needed for swelling) 40 tablet 3  . traZODone (DESYREL) 50 MG tablet TAKE 1 TABLET(50 MG) BY MOUTH AT BEDTIME (Patient taking differently: Take 50 mg by mouth at bedtime. ) 90 tablet 0  . vitamin B-12 (CYANOCOBALAMIN) 500 MCG tablet Take 500 mcg by mouth every other day.    Marland Kitchen  VYNDAQEL 20 MG CAPS Take 80 mg by mouth daily. (4 capsules) (Patient taking differently: Take 80 mg by mouth every evening. (4 capsules)) 120 capsule   . zolpidem (AMBIEN) 5 MG tablet TAKE 1 TABLET BY MOUTH EVERY NIGHT AT BEDTIME AS NEEDED FOR SLEEP (Patient taking differently: Take 5 mg by mouth at bedtime. ) 30 tablet 1  . glimepiride (AMARYL) 1 MG tablet Take 1 mg by mouth daily.  0  . potassium chloride SA (K-DUR,KLOR-CON) 20 MEQ tablet Take 2 tablets (40 mEq total) by mouth daily. 180 tablet 3   No facility-administered medications prior to visit.     ROS Review of Systems  Constitutional: Negative for fatigue, fever and unexpected weight change.  HENT: Negative.   Eyes: Negative for photophobia and visual disturbance.  Respiratory: Negative.   Cardiovascular: Negative.   Gastrointestinal: Negative for anal bleeding, blood in stool and constipation.  Endocrine: Negative for polyphagia and polyuria.  Skin: Negative for color change and pallor.  Allergic/Immunologic: Negative for immunocompromised state.  Neurological: Negative for light-headedness.  Hematological: Does not bruise/bleed easily.  Psychiatric/Behavioral: Negative.     Objective:  BP 102/60   Pulse 100   Temp 97.8 F (36.6 C)   Ht 5' 9"  (1.753 m)   Wt 176 lb 6 oz (80 kg)   SpO2 97%   BMI 26.05 kg/m   BP Readings from Last 3 Encounters:  12/12/17 102/60  12/10/17 (!) 100/57  12/04/17 97/69    Wt Readings from Last 3 Encounters:  12/12/17 176 lb 6 oz (80 kg)  12/10/17 175 lb (79.4 kg)  12/04/17 174 lb 6.1 oz (79.1 kg)    Physical Exam  Constitutional: He is oriented to person, place, and time. He appears well-developed and well-nourished. No distress.  HENT:  Head: Normocephalic and atraumatic.  Right Ear: External ear normal.  Left Ear: External ear normal.  Mouth/Throat: Oropharynx is clear and moist.  Eyes: Pupils are equal, round, and reactive to light. Conjunctivae and EOM are normal.  Neck:  No JVD present. No tracheal deviation present.  Cardiovascular: An irregularly irregular rhythm present.  Pulmonary/Chest: Effort normal and breath sounds normal.  Musculoskeletal:  Without edema   Neurological: He is alert and oriented to person, place, and time.  Skin: Skin is warm and dry. He is not diaphoretic. No pallor.  Psychiatric: He has a normal mood and affect. His behavior is normal.    Lab Results  Component Value Date   WBC 6.8 12/12/2017   HGB 10.8 (L) 12/12/2017   HCT 33.9 (L) 12/12/2017   PLT 195.0 12/12/2017   GLUCOSE 84 12/12/2017   ALT 25 12/02/2017   AST 35 12/02/2017   NA 142 12/12/2017   K 4.6 12/12/2017   CL 98 12/12/2017   CREATININE 1.68 (H) 12/12/2017  BUN 36 (H) 12/12/2017   CO2 37 (H) 12/12/2017   TSH 1.381 11/19/2017   INR 1.34 12/02/2017   HGBA1C 6.7 (H) 12/12/2017    No results found.  Assessment & Plan:   Dillon Andrews was seen today for hospitalization follow-up.  Diagnoses and all orders for this visit:  Type 2 diabetes mellitus with stage 3 chronic kidney disease, without long-term current use of insulin (HCC) -     Basic metabolic panel -     Hemoglobin A1c -     metFORMIN (GLUCOPHAGE) 500 MG tablet; Take 1 tablet (500 mg total) by mouth at bedtime.  Anemia, unspecified type -     CBC -     Iron, TIBC and Ferritin Panel -     B12  Hospital discharge follow-up   I have discontinued Dillon Andrews's glimepiride. I am also having him start on metFORMIN. Additionally, I am having him maintain his vitamin B-12, omeprazole, Probiotic Product (PROBIOTIC DAILY PO), magnesium oxide, Melatonin, gabapentin, ondansetron, zolpidem, traZODone, rosuvastatin, amiodarone, milrinone, torsemide, VYNDAQEL, hydrocortisone, apixaban, sertraline, polyethylene glycol, senna-docusate, glucose blood, and dronabinol.  Meds ordered this encounter  Medications  . metFORMIN (GLUCOPHAGE) 500 MG tablet    Sig: Take 1 tablet (500 mg total) by mouth at  bedtime.    Dispense:  90 tablet    Refill:  0   Recheck hemoglobin A1c today.  Patient will check his fasting sugars over the next few weeks.  Elevation of either of the above will consider insulin therapy.  Advised multivitamin with iron.  Follow-up: Return 2-4 weeks.  [name], MD

## 2017-12-15 DIAGNOSIS — I4891 Unspecified atrial fibrillation: Secondary | ICD-10-CM | POA: Diagnosis not present

## 2017-12-15 DIAGNOSIS — N183 Chronic kidney disease, stage 3 (moderate): Secondary | ICD-10-CM | POA: Diagnosis not present

## 2017-12-15 DIAGNOSIS — E1122 Type 2 diabetes mellitus with diabetic chronic kidney disease: Secondary | ICD-10-CM | POA: Diagnosis not present

## 2017-12-15 DIAGNOSIS — K7460 Unspecified cirrhosis of liver: Secondary | ICD-10-CM | POA: Diagnosis not present

## 2017-12-15 DIAGNOSIS — I429 Cardiomyopathy, unspecified: Secondary | ICD-10-CM | POA: Diagnosis not present

## 2017-12-15 DIAGNOSIS — I130 Hypertensive heart and chronic kidney disease with heart failure and stage 1 through stage 4 chronic kidney disease, or unspecified chronic kidney disease: Secondary | ICD-10-CM | POA: Diagnosis not present

## 2017-12-15 DIAGNOSIS — D631 Anemia in chronic kidney disease: Secondary | ICD-10-CM | POA: Diagnosis not present

## 2017-12-15 DIAGNOSIS — C9000 Multiple myeloma not having achieved remission: Secondary | ICD-10-CM | POA: Diagnosis not present

## 2017-12-15 DIAGNOSIS — I5043 Acute on chronic combined systolic (congestive) and diastolic (congestive) heart failure: Secondary | ICD-10-CM | POA: Diagnosis not present

## 2017-12-16 ENCOUNTER — Inpatient Hospital Stay: Payer: Medicare HMO | Attending: Hematology & Oncology | Admitting: Hematology & Oncology

## 2017-12-16 ENCOUNTER — Inpatient Hospital Stay: Payer: Medicare HMO

## 2017-12-16 ENCOUNTER — Telehealth: Payer: Self-pay | Admitting: *Deleted

## 2017-12-16 ENCOUNTER — Other Ambulatory Visit: Payer: Self-pay

## 2017-12-16 ENCOUNTER — Encounter: Payer: Self-pay | Admitting: Hematology & Oncology

## 2017-12-16 VITALS — BP 111/62 | HR 70 | Temp 97.9°F | Resp 18 | Wt 173.8 lb

## 2017-12-16 DIAGNOSIS — C9000 Multiple myeloma not having achieved remission: Secondary | ICD-10-CM | POA: Diagnosis not present

## 2017-12-16 DIAGNOSIS — I4891 Unspecified atrial fibrillation: Secondary | ICD-10-CM | POA: Diagnosis not present

## 2017-12-16 DIAGNOSIS — K5900 Constipation, unspecified: Secondary | ICD-10-CM

## 2017-12-16 DIAGNOSIS — D62 Acute posthemorrhagic anemia: Secondary | ICD-10-CM

## 2017-12-16 DIAGNOSIS — Z7901 Long term (current) use of anticoagulants: Secondary | ICD-10-CM | POA: Diagnosis not present

## 2017-12-16 DIAGNOSIS — R110 Nausea: Secondary | ICD-10-CM

## 2017-12-16 DIAGNOSIS — Z79899 Other long term (current) drug therapy: Secondary | ICD-10-CM

## 2017-12-16 DIAGNOSIS — N183 Chronic kidney disease, stage 3 unspecified: Secondary | ICD-10-CM

## 2017-12-16 LAB — CMP (CANCER CENTER ONLY)
ALBUMIN: 3.3 g/dL — AB (ref 3.5–5.0)
ALT: 33 U/L (ref 10–47)
AST: 40 U/L — ABNORMAL HIGH (ref 11–38)
Alkaline Phosphatase: 168 U/L — ABNORMAL HIGH (ref 26–84)
Anion gap: 12 (ref 5–15)
BUN: 36 mg/dL — AB (ref 7–22)
CHLORIDE: 96 mmol/L — AB (ref 98–108)
CO2: 34 mmol/L — AB (ref 18–33)
CREATININE: 1.8 mg/dL — AB (ref 0.60–1.20)
Calcium: 9.4 mg/dL (ref 8.0–10.3)
Glucose, Bld: 132 mg/dL — ABNORMAL HIGH (ref 73–118)
Potassium: 4.7 mmol/L (ref 3.3–4.7)
SODIUM: 142 mmol/L (ref 128–145)
Total Bilirubin: 0.9 mg/dL (ref 0.2–1.6)
Total Protein: 7.6 g/dL (ref 6.4–8.1)

## 2017-12-16 LAB — CBC WITH DIFFERENTIAL (CANCER CENTER ONLY)
BASOS PCT: 1 %
Basophils Absolute: 0 10*3/uL (ref 0.0–0.1)
EOS ABS: 0.1 10*3/uL (ref 0.0–0.5)
Eosinophils Relative: 1 %
HCT: 37.3 % — ABNORMAL LOW (ref 38.7–49.9)
HEMOGLOBIN: 11.5 g/dL — AB (ref 13.0–17.1)
Lymphocytes Relative: 9 %
Lymphs Abs: 0.8 10*3/uL — ABNORMAL LOW (ref 0.9–3.3)
MCH: 29.6 pg (ref 28.0–33.4)
MCHC: 30.8 g/dL — AB (ref 32.0–35.9)
MCV: 96.1 fL (ref 82.0–98.0)
MONOS PCT: 10 %
Monocytes Absolute: 0.9 10*3/uL (ref 0.1–0.9)
NEUTROS PCT: 79 %
Neutro Abs: 6.7 10*3/uL — ABNORMAL HIGH (ref 1.5–6.5)
Platelet Count: 172 10*3/uL (ref 145–400)
RBC: 3.88 MIL/uL — ABNORMAL LOW (ref 4.20–5.70)
RDW: 23 % — ABNORMAL HIGH (ref 11.1–15.7)
WBC Count: 8.5 10*3/uL (ref 4.0–10.0)

## 2017-12-16 MED ORDER — ONDANSETRON HCL 4 MG PO TABS: 4.0000 mg | ORAL_TABLET | Freq: Three times a day (TID) | ORAL | 2 refills | Status: DC | PRN

## 2017-12-16 MED ORDER — METFORMIN HCL 500 MG PO TABS: 500.0000 mg | ORAL_TABLET | Freq: Every day | ORAL | 0 refills | Status: DC

## 2017-12-16 NOTE — Addendum Note (Signed)
Addended by: Jon Billings on: 12/16/2017 08:01 AM   Modules accepted: Orders

## 2017-12-16 NOTE — Progress Notes (Signed)
Hematology and Oncology Follow Up Visit  Dillon Andrews [id number] 1941-01-15 77 y.o. 12/16/2017   Principle Diagnosis:   Kappa light chain myeloma  Current Therapy:    RVD --  Cycle #1 to start 10/30/2017 -- stopped on 11/2017     Interim History:  Dillon Andrews is back for follow-up.  As seen him in the hospital.  He has been in the hospital with problems he is reflective of his heart.  He has had heart failure.  He has had atrial fibrillation.  He now is on a portable pump with milrinone.  He I think he is on amiodarone also to try to keep him in sinus rhythm.  He also is now taking the medicine for transthyretin associated amyloidosis.  I just do not think that treating him with systemic chemotherapy is going to improve his quality of life or improve his cardiac status.  It seems like his cardiac issues are related to transthyretin amyloid which systemic chemotherapy will not treat.  He does look much better than when I last saw him.  He is walking around the house.  He is trying to be more active.  He did have bleeding while in the hospital.  He was very constipated and because of strain he may have ruptured a hemorrhoid.  He has not had any lab work done today.  His appetite is not that great.  He has some abdominal queasiness.  He says Zofran seems to help.  I will reorder Zofran.  Currently, his performance status is ECOG 2.  Medications:  Current Outpatient Medications:  .  amiodarone (PACERONE) 200 MG tablet, Take 1 tablet (200 mg total) by mouth 2 (two) times daily., Disp: 60 tablet, Rfl: 6 .  apixaban (ELIQUIS) 2.5 MG TABS tablet, Take 1 tablet (2.5 mg total) by mouth 2 (two) times daily., Disp: 60 tablet, Rfl: 0 .  dronabinol (MARINOL) 5 MG capsule, Take 1 capsule (5 mg total) by mouth 2 (two) times daily before lunch and supper., Disp: 60 capsule, Rfl: 2 .  gabapentin (NEURONTIN) 800 MG tablet, TAKE 1 TABLET BY MOUTH AT BEDTIME (Patient taking differently: Take 800  mg by mouth at bedtime. ), Disp: 90 tablet, Rfl: 1 .  glucose blood test strip, Use as instructed, Disp: 100 each, Rfl: 12 .  hydrocortisone (ANUSOL-HC) 25 MG suppository, Place 1 suppository (25 mg total) rectally 2 (two) times daily. (Patient taking differently: Place 25 mg rectally 2 (two) times daily as needed for hemorrhoids. ), Disp: 12 suppository, Rfl: 0 .  magnesium oxide (MAG-OX) 400 MG tablet, Take 400 mg by mouth daily., Disp: , Rfl:  .  Melatonin 5 MG TABS, Take 5 mg by mouth at bedtime. , Disp: , Rfl:  .  metFORMIN (GLUCOPHAGE) 500 MG tablet, Take 1 tablet (500 mg total) by mouth at bedtime., Disp: 90 tablet, Rfl: 0 .  milrinone (PRIMACOR) 20 MG/100 ML SOLN infusion, Inject 0.0099 mg/min into the vein continuous., Disp: 100 mL, Rfl: 0 .  omeprazole (PRILOSEC) 40 MG capsule, Take 1 capsule (40 mg total) by mouth daily., Disp: 90 capsule, Rfl: 1 .  ondansetron (ZOFRAN) 4 MG tablet, Take 1 tablet (4 mg total) by mouth every 8 (eight) hours as needed for nausea or vomiting., Disp: 20 tablet, Rfl: 0 .  polyethylene glycol (MIRALAX / GLYCOLAX) packet, Take 17 g by mouth daily. (Patient taking differently: Take 17 g by mouth daily as needed (constipation). Mix with 8 oz orange juice and drink), Disp: 14  each, Rfl: 0 .  potassium chloride SA (K-DUR,KLOR-CON) 20 MEQ tablet, Take 2 tablets (40 mEq total) by mouth 2 (two) times daily., Disp: 360 tablet, Rfl: 3 .  Probiotic Product (PROBIOTIC DAILY PO), Take 1 tablet by mouth daily. , Disp: , Rfl:  .  rosuvastatin (CRESTOR) 10 MG tablet, TAKE 1 TABLET BY MOUTH ONCE DAILY (Patient taking differently: Take 10 mg by mouth daily. ), Disp: 90 tablet, Rfl: 0 .  senna-docusate (SENOKOT-S) 8.6-50 MG tablet, Take 2 tablets by mouth 2 (two) times daily. (Patient taking differently: Take 1 tablet by mouth See admin instructions. Take one tablet by mouth every morning, may take another tablet in the evening as needed for constipation), Disp: 20 tablet, Rfl: 0 .   sertraline (ZOLOFT) 25 MG tablet, Take 1 tablet (25 mg total) by mouth daily., Disp: 30 tablet, Rfl: 0 .  torsemide (DEMADEX) 20 MG tablet, Take 1 tablet (20 mg total) by mouth daily. Can take 20 mg extra as needed. (Patient taking differently: Take 20 mg by mouth See admin instructions. Take one tablet (20 mg) by mouth every morning, may also take one tablet in the afternoon as needed for swelling), Disp: 40 tablet, Rfl: 3 .  traZODone (DESYREL) 50 MG tablet, TAKE 1 TABLET(50 MG) BY MOUTH AT BEDTIME (Patient taking differently: Take 50 mg by mouth at bedtime. ), Disp: 90 tablet, Rfl: 0 .  vitamin B-12 (CYANOCOBALAMIN) 500 MCG tablet, Take 500 mcg by mouth every other day., Disp: , Rfl:  .  VYNDAQEL 20 MG CAPS, Take 80 mg by mouth daily. (4 capsules) (Patient taking differently: Take 80 mg by mouth every evening. (4 capsules)), Disp: 120 capsule, Rfl:  .  zolpidem (AMBIEN) 5 MG tablet, TAKE 1 TABLET BY MOUTH EVERY NIGHT AT BEDTIME AS NEEDED FOR SLEEP (Patient taking differently: Take 5 mg by mouth at bedtime. ), Disp: 30 tablet, Rfl: 1  Allergies: No Known Allergies  Past Medical History, Surgical history, Social history, and Family History were reviewed and updated.  Review of Systems: Review of Systems  Constitutional: Positive for appetite change and fatigue.  HENT:  Negative.   Eyes: Negative.   Respiratory: Negative.   Cardiovascular: Negative.   Gastrointestinal: Positive for abdominal pain and nausea.  Endocrine: Negative.   Genitourinary: Negative.    Musculoskeletal: Negative.   Skin: Negative.   Neurological: Negative.   Hematological: Negative.     Physical Exam:  weight is 173 lb 12 oz (78.8 kg). His oral temperature is 97.9 F (36.6 C). His blood pressure is 111/62 and his pulse is 70. His respiration is 18 and oxygen saturation is 100%.   Wt Readings from Last 3 Encounters:  12/16/17 173 lb 12 oz (78.8 kg)  12/12/17 176 lb 6 oz (80 kg)  12/10/17 175 lb (79.4 kg)     Physical Exam  Constitutional: He is oriented to person, place, and time.  HENT:  Head: Normocephalic and atraumatic.  Mouth/Throat: Oropharynx is clear and moist.  Eyes: Pupils are equal, round, and reactive to light. EOM are normal.  Neck: Normal range of motion.  Cardiovascular: Normal rate, regular rhythm and normal heart sounds.  Pulmonary/Chest: Effort normal and breath sounds normal.  Abdominal: Soft. Bowel sounds are normal.  Musculoskeletal: Normal range of motion. He exhibits no edema, tenderness or deformity.  Lymphadenopathy:    He has no cervical adenopathy.  Neurological: He is alert and oriented to person, place, and time.  Skin: Skin is warm and dry. No rash  noted. No erythema.  Psychiatric: He has a normal mood and affect. His behavior is normal. Judgment and thought content normal.  Vitals reviewed.    Lab Results  Component Value Date   WBC 8.5 12/16/2017   HGB 11.5 (L) 12/16/2017   HCT 37.3 (L) 12/16/2017   MCV 96.1 12/16/2017   PLT 172 12/16/2017     Chemistry      Component Value Date/Time   NA 142 12/16/2017 1448   NA 148 (H) 06/12/2017 0000   K 4.7 12/16/2017 1448   CL 96 (L) 12/16/2017 1448   CO2 34 (H) 12/16/2017 1448   BUN 36 (H) 12/16/2017 1448   BUN 38 (H) 06/12/2017 0000   CREATININE 1.80 (H) 12/16/2017 1448      Component Value Date/Time   CALCIUM 9.4 12/16/2017 1448   ALKPHOS 168 (H) 12/16/2017 1448   AST 40 (H) 12/16/2017 1448   ALT 33 12/16/2017 1448   BILITOT 0.9 12/16/2017 1448       Impression and Plan: Dillon Andrews is a 77 year old white male.  He has kappa light chain disease.   At this point, I just do not see that treating him with systemic chemotherapy is going to benefit him.  I we will see what his lab work looks like.  I would like to see him back in about 2 months.  By then, we should be seeing continued improvement with him.   Volanda Napoleon, MD 9/3/20193:20 PM

## 2017-12-16 NOTE — Telephone Encounter (Addendum)
Patient's wife is aware of results. She is aware of medication change  ----- Message from Volanda Napoleon, MD sent at 12/16/2017  3:29 PM EDT ----- Call - the K+ is perfect!!!  Take it daily, not twice a day.  Laurey Arrow

## 2017-12-17 ENCOUNTER — Encounter (HOSPITAL_COMMUNITY): Payer: Medicare HMO

## 2017-12-17 ENCOUNTER — Telehealth: Payer: Self-pay | Admitting: *Deleted

## 2017-12-17 ENCOUNTER — Other Ambulatory Visit (HOSPITAL_COMMUNITY): Payer: Self-pay | Admitting: Internal Medicine

## 2017-12-17 LAB — ERYTHROPOIETIN: ERYTHROPOIETIN: 87.9 m[IU]/mL — AB (ref 2.6–18.5)

## 2017-12-17 LAB — FERRITIN: Ferritin: 309 ng/mL (ref 24–336)

## 2017-12-17 LAB — IRON AND TIBC
Iron: 41 ug/dL — ABNORMAL LOW (ref 42–163)
SATURATION RATIOS: 13 % — AB (ref 42–163)
TIBC: 326 ug/dL (ref 202–409)
UIBC: 285 ug/dL

## 2017-12-17 LAB — KAPPA/LAMBDA LIGHT CHAINS
Kappa free light chain: 104 mg/L — ABNORMAL HIGH (ref 3.3–19.4)
Kappa, lambda light chain ratio: 2.76 — ABNORMAL HIGH (ref 0.26–1.65)
Lambda free light chains: 37.7 mg/L — ABNORMAL HIGH (ref 5.7–26.3)

## 2017-12-17 NOTE — Telephone Encounter (Addendum)
Patient is aware of results. Appointment made  ----- Message from Volanda Napoleon, MD sent at 12/17/2017 11:15 AM EDT ----- Call - the iron is low!!  He needs 1 dose of Feaheme.  Please set this up for 1-2 week.  Thanks!!  Laurey Arrow

## 2017-12-18 DIAGNOSIS — I5043 Acute on chronic combined systolic (congestive) and diastolic (congestive) heart failure: Secondary | ICD-10-CM | POA: Diagnosis not present

## 2017-12-18 DIAGNOSIS — E119 Type 2 diabetes mellitus without complications: Secondary | ICD-10-CM | POA: Diagnosis not present

## 2017-12-18 DIAGNOSIS — I429 Cardiomyopathy, unspecified: Secondary | ICD-10-CM | POA: Diagnosis not present

## 2017-12-18 DIAGNOSIS — N183 Chronic kidney disease, stage 3 (moderate): Secondary | ICD-10-CM | POA: Diagnosis not present

## 2017-12-18 DIAGNOSIS — D631 Anemia in chronic kidney disease: Secondary | ICD-10-CM | POA: Diagnosis not present

## 2017-12-18 DIAGNOSIS — C9000 Multiple myeloma not having achieved remission: Secondary | ICD-10-CM | POA: Diagnosis not present

## 2017-12-18 DIAGNOSIS — K7460 Unspecified cirrhosis of liver: Secondary | ICD-10-CM | POA: Diagnosis not present

## 2017-12-18 DIAGNOSIS — E1122 Type 2 diabetes mellitus with diabetic chronic kidney disease: Secondary | ICD-10-CM | POA: Diagnosis not present

## 2017-12-18 DIAGNOSIS — I4891 Unspecified atrial fibrillation: Secondary | ICD-10-CM | POA: Diagnosis not present

## 2017-12-18 DIAGNOSIS — I130 Hypertensive heart and chronic kidney disease with heart failure and stage 1 through stage 4 chronic kidney disease, or unspecified chronic kidney disease: Secondary | ICD-10-CM | POA: Diagnosis not present

## 2017-12-19 ENCOUNTER — Ambulatory Visit (HOSPITAL_COMMUNITY): Payer: Medicare HMO | Admitting: Anesthesiology

## 2017-12-19 ENCOUNTER — Encounter (HOSPITAL_COMMUNITY): Payer: Medicare HMO

## 2017-12-19 ENCOUNTER — Encounter (HOSPITAL_COMMUNITY)
Admission: RE | Disposition: A | Discharge status: Home / Self Care | Payer: Self-pay | Source: Ambulatory Visit | Attending: Internal Medicine

## 2017-12-19 ENCOUNTER — Encounter (HOSPITAL_COMMUNITY): Payer: Self-pay | Admitting: *Deleted

## 2017-12-19 ENCOUNTER — Ambulatory Visit (HOSPITAL_COMMUNITY)
Admission: RE | Admit: 2017-12-19 | Discharge: 2017-12-19 | Disposition: A | Discharge status: Home / Self Care | Payer: Medicare HMO | Source: Ambulatory Visit | Attending: Internal Medicine | Admitting: Internal Medicine

## 2017-12-19 DIAGNOSIS — I081 Rheumatic disorders of both mitral and tricuspid valves: Secondary | ICD-10-CM | POA: Insufficient documentation

## 2017-12-19 DIAGNOSIS — I4510 Unspecified right bundle-branch block: Secondary | ICD-10-CM | POA: Diagnosis not present

## 2017-12-19 DIAGNOSIS — Z87891 Personal history of nicotine dependence: Secondary | ICD-10-CM | POA: Diagnosis not present

## 2017-12-19 DIAGNOSIS — I130 Hypertensive heart and chronic kidney disease with heart failure and stage 1 through stage 4 chronic kidney disease, or unspecified chronic kidney disease: Secondary | ICD-10-CM | POA: Insufficient documentation

## 2017-12-19 DIAGNOSIS — E1151 Type 2 diabetes mellitus with diabetic peripheral angiopathy without gangrene: Secondary | ICD-10-CM | POA: Diagnosis not present

## 2017-12-19 DIAGNOSIS — E7800 Pure hypercholesterolemia, unspecified: Secondary | ICD-10-CM | POA: Insufficient documentation

## 2017-12-19 DIAGNOSIS — I484 Atypical atrial flutter: Secondary | ICD-10-CM | POA: Diagnosis not present

## 2017-12-19 DIAGNOSIS — E1122 Type 2 diabetes mellitus with diabetic chronic kidney disease: Secondary | ICD-10-CM | POA: Diagnosis not present

## 2017-12-19 DIAGNOSIS — K219 Gastro-esophageal reflux disease without esophagitis: Secondary | ICD-10-CM | POA: Insufficient documentation

## 2017-12-19 DIAGNOSIS — I5042 Chronic combined systolic (congestive) and diastolic (congestive) heart failure: Secondary | ICD-10-CM | POA: Diagnosis not present

## 2017-12-19 DIAGNOSIS — I4892 Unspecified atrial flutter: Secondary | ICD-10-CM

## 2017-12-19 DIAGNOSIS — Z7901 Long term (current) use of anticoagulants: Secondary | ICD-10-CM | POA: Insufficient documentation

## 2017-12-19 DIAGNOSIS — I428 Other cardiomyopathies: Secondary | ICD-10-CM | POA: Insufficient documentation

## 2017-12-19 DIAGNOSIS — N183 Chronic kidney disease, stage 3 (moderate): Secondary | ICD-10-CM | POA: Insufficient documentation

## 2017-12-19 DIAGNOSIS — M1990 Unspecified osteoarthritis, unspecified site: Secondary | ICD-10-CM | POA: Diagnosis not present

## 2017-12-19 HISTORY — PX: CARDIOVERSION: SHX1299

## 2017-12-19 SURGERY — CARDIOVERSION
Anesthesia: General

## 2017-12-19 MED ORDER — SODIUM CHLORIDE 0.9% FLUSH: 3.0000 mL | Freq: Two times a day (BID) | INTRAVENOUS | Status: DC

## 2017-12-19 MED ORDER — SODIUM CHLORIDE 0.9 % IV SOLN: INTRAVENOUS | Status: DC

## 2017-12-19 MED ORDER — PROPOFOL 10 MG/ML IV BOLUS
INTRAVENOUS | Status: DC | PRN
  Administered 2017-12-19: 30 mg via INTRAVENOUS

## 2017-12-19 MED ORDER — ASPIRIN 81 MG PO CHEW: 81.0000 mg | CHEWABLE_TABLET | ORAL | Status: DC

## 2017-12-19 MED ORDER — SODIUM CHLORIDE 0.9 % IV SOLN
250.0000 mL | INTRAVENOUS | Status: DC | PRN
  Administered 2017-12-19: 09:00:00 via INTRAVENOUS

## 2017-12-19 MED ORDER — LIDOCAINE HCL (CARDIAC) PF 100 MG/5ML IV SOSY
PREFILLED_SYRINGE | INTRAVENOUS | Status: DC | PRN
  Administered 2017-12-19: 40 mg via INTRAVENOUS

## 2017-12-19 MED ORDER — SODIUM CHLORIDE 0.9% FLUSH: 3.0000 mL | INTRAVENOUS | Status: DC | PRN

## 2017-12-19 NOTE — Anesthesia Preprocedure Evaluation (Signed)
Anesthesia Evaluation  Patient identified by MRN, date of birth, ID band Patient awake    Reviewed: Allergy & Precautions, H&P , NPO status , Patient's Chart, lab work & pertinent test results, reviewed documented beta blocker date and time   Airway Mallampati: III  TM Distance: >3 FB Neck ROM: Full    Dental no notable dental hx. (+) Teeth Intact, Dental Advisory Given   Pulmonary neg pulmonary ROS, former smoker,    Pulmonary exam normal breath sounds clear to auscultation       Cardiovascular hypertension, Pt. on medications and Pt. on home beta blockers + Peripheral Vascular Disease and +CHF  + dysrhythmias Atrial Fibrillation  Rhythm:Regular Rate:Normal     Neuro/Psych negative neurological ROS  negative psych ROS   GI/Hepatic Neg liver ROS, GERD  ,  Endo/Other  diabetes  Renal/GU Renal disease  negative genitourinary   Musculoskeletal   Abdominal   Peds  Hematology negative hematology ROS (+) anemia ,   Anesthesia Other Findings   Reproductive/Obstetrics negative OB ROS                             Anesthesia Physical  Anesthesia Plan  ASA: III  Anesthesia Plan: General   Post-op Pain Management:    Induction: Intravenous  PONV Risk Score and Plan: 2 and Treatment may vary due to age or medical condition  Airway Management Planned: Mask  Additional Equipment:   Intra-op Plan:   Post-operative Plan:   Informed Consent: I have reviewed the patients History and Physical, chart, labs and discussed the procedure including the risks, benefits and alternatives for the proposed anesthesia with the patient or authorized representative who has indicated his/her understanding and acceptance.   Dental advisory given  Plan Discussed with: CRNA  Anesthesia Plan Comments:         Anesthesia Quick Evaluation

## 2017-12-19 NOTE — Interval H&P Note (Signed)
History and Physical Interval Note:  12/19/2017 8:56 AM  Dillon Andrews  has presented today for surgery, with the diagnosis of atrial flutter  The various methods of treatment have been discussed with the patient and family. After consideration of risks, benefits and other options for treatment, the patient has consented to  Procedure(s): CARDIOVERSION (N/A) as a surgical intervention .  The patient's history has been reviewed, patient examined, no change in status, stable for surgery.  I have reviewed the patient's chart and labs.  Questions were answered to the patient's satisfaction.     Render [name]

## 2017-12-19 NOTE — Transfer of Care (Signed)
Immediate Anesthesia Transfer of Care Note  Patient: Dillon Andrews  Procedure(s) Performed: CARDIOVERSION (N/A )  Patient Location: Endoscopy Unit  Anesthesia Type:MAC  Level of Consciousness: awake  Airway & Oxygen Therapy: Patient Spontanous Breathing and Patient connected to nasal cannula oxygen  Post-op Assessment: Report given to RN  Post vital signs: Reviewed and stable  Last Vitals:  Vitals Value Taken Time  BP    Temp    Pulse    Resp    SpO2      Last Pain:  Vitals:   12/19/17 0849  TempSrc: Oral  PainSc: 0-No pain         Complications: No apparent anesthesia complications

## 2017-12-19 NOTE — Anesthesia Postprocedure Evaluation (Signed)
Anesthesia Post Note  Patient: Dillon Andrews  Procedure(s) Performed: CARDIOVERSION (N/A )     Patient location during evaluation: Endoscopy Anesthesia Type: General Level of consciousness: awake and alert Pain management: pain level controlled Vital Signs Assessment: post-procedure vital signs reviewed and stable Respiratory status: spontaneous breathing, nonlabored ventilation, respiratory function stable and patient connected to nasal cannula oxygen Cardiovascular status: blood pressure returned to baseline and stable Postop Assessment: no apparent nausea or vomiting Anesthetic complications: no    Last Vitals:  Vitals:   12/19/17 0849  BP: 101/68  Pulse: 75  Resp: 18  Temp: 36.6 C  SpO2: 92%    Last Pain:  Vitals:   12/19/17 0849  TempSrc: Oral  PainSc: 0-No pain                 Montez Hageman

## 2017-12-19 NOTE — CV Procedure (Signed)
    DIRECT CURRENT CARDIOVERSION  NAME:  Dillon Andrews   MRN: [id number] DOB:  1941/02/04   ADMIT DATE: 12/19/2017   INDICATIONS: Atrial flutter   PROCEDURE:   Informed consent was obtained prior to the procedure. The risks, benefits and alternatives for the procedure were discussed and the patient comprehended these risks. Once an appropriate time out was taken, the patient had the defibrillator pads placed in the anterior and posterior position. The patient then underwent sedation by the anesthesia service. Once an appropriate level of sedation was achieved, the patient received a single biphasic, synchronized 150J shock with prompt conversion to sinus rhythm. No apparent complications.  Glori Bickers, MD  9:52 AM

## 2017-12-19 NOTE — Discharge Instructions (Signed)
Electrical Cardioversion, Care After °This sheet gives you information about how to care for yourself after your procedure. Your health care provider may also give you more specific instructions. If you have problems or questions, contact your health care provider. °What can I expect after the procedure? °After the procedure, it is common to have: °· Some redness on the skin where the shocks were given. ° °Follow these instructions at home: °· Do not drive for 24 hours if you were given a medicine to help you relax (sedative). °· Take over-the-counter and prescription medicines only as told by your health care provider. °· Ask your health care provider how to check your pulse. Check it often. °· Rest for 48 hours after the procedure or as told by your health care provider. °· Avoid or limit your caffeine use as told by your health care provider. °Contact a health care provider if: °· You feel like your heart is beating too quickly or your pulse is not regular. °· You have a serious muscle cramp that does not go away. °Get help right away if: °· You have discomfort in your chest. °· You are dizzy or you feel faint. °· You have trouble breathing or you are short of breath. °· Your speech is slurred. °· You have trouble moving an arm or leg on one side of your body. °· Your fingers or toes turn cold or blue. °This information is not intended to replace advice given to you by your health care provider. Make sure you discuss any questions you have with your health care provider. °Document Released: 01/20/2013 Document Revised: 11/03/2015 Document Reviewed: 10/06/2015 °Elsevier Interactive Patient Education © 2018 [employer] ° °

## 2017-12-22 ENCOUNTER — Encounter (HOSPITAL_COMMUNITY): Payer: Self-pay | Admitting: Internal Medicine

## 2017-12-22 ENCOUNTER — Encounter (HOSPITAL_COMMUNITY): Payer: Medicare HMO

## 2017-12-22 ENCOUNTER — Inpatient Hospital Stay: Payer: Medicare HMO

## 2017-12-22 ENCOUNTER — Other Ambulatory Visit: Payer: Self-pay

## 2017-12-22 VITALS — BP 98/57 | HR 62 | Temp 98.3°F

## 2017-12-22 DIAGNOSIS — D62 Acute posthemorrhagic anemia: Secondary | ICD-10-CM

## 2017-12-22 DIAGNOSIS — I4891 Unspecified atrial fibrillation: Secondary | ICD-10-CM | POA: Diagnosis not present

## 2017-12-22 DIAGNOSIS — Z7901 Long term (current) use of anticoagulants: Secondary | ICD-10-CM | POA: Diagnosis not present

## 2017-12-22 DIAGNOSIS — Z79899 Other long term (current) drug therapy: Secondary | ICD-10-CM | POA: Diagnosis not present

## 2017-12-22 DIAGNOSIS — K5900 Constipation, unspecified: Secondary | ICD-10-CM | POA: Diagnosis not present

## 2017-12-22 DIAGNOSIS — C9000 Multiple myeloma not having achieved remission: Secondary | ICD-10-CM | POA: Diagnosis not present

## 2017-12-22 MED ORDER — SODIUM CHLORIDE 0.9 % IV SOLN
Freq: Once | INTRAVENOUS | Status: AC
  Administered 2017-12-22: 14:00:00 via INTRAVENOUS
  Filled 2017-12-22: qty 250

## 2017-12-22 MED ORDER — SODIUM CHLORIDE 0.9 % IV SOLN
510.0000 mg | Freq: Once | INTRAVENOUS | Status: AC
  Administered 2017-12-22: 510 mg via INTRAVENOUS
  Filled 2017-12-22: qty 17

## 2017-12-22 NOTE — Patient Instructions (Signed)

## 2017-12-24 ENCOUNTER — Encounter (HOSPITAL_COMMUNITY): Payer: Medicare HMO

## 2017-12-25 DIAGNOSIS — N183 Chronic kidney disease, stage 3 (moderate): Secondary | ICD-10-CM | POA: Diagnosis not present

## 2017-12-25 DIAGNOSIS — I5043 Acute on chronic combined systolic (congestive) and diastolic (congestive) heart failure: Secondary | ICD-10-CM | POA: Diagnosis not present

## 2017-12-25 DIAGNOSIS — D631 Anemia in chronic kidney disease: Secondary | ICD-10-CM | POA: Diagnosis not present

## 2017-12-25 DIAGNOSIS — E1122 Type 2 diabetes mellitus with diabetic chronic kidney disease: Secondary | ICD-10-CM | POA: Diagnosis not present

## 2017-12-25 DIAGNOSIS — K7460 Unspecified cirrhosis of liver: Secondary | ICD-10-CM | POA: Diagnosis not present

## 2017-12-25 DIAGNOSIS — I4891 Unspecified atrial fibrillation: Secondary | ICD-10-CM | POA: Diagnosis not present

## 2017-12-25 DIAGNOSIS — C9000 Multiple myeloma not having achieved remission: Secondary | ICD-10-CM | POA: Diagnosis not present

## 2017-12-25 DIAGNOSIS — E119 Type 2 diabetes mellitus without complications: Secondary | ICD-10-CM | POA: Diagnosis not present

## 2017-12-25 DIAGNOSIS — I130 Hypertensive heart and chronic kidney disease with heart failure and stage 1 through stage 4 chronic kidney disease, or unspecified chronic kidney disease: Secondary | ICD-10-CM | POA: Diagnosis not present

## 2017-12-25 DIAGNOSIS — I429 Cardiomyopathy, unspecified: Secondary | ICD-10-CM | POA: Diagnosis not present

## 2017-12-26 ENCOUNTER — Encounter (HOSPITAL_COMMUNITY): Payer: Medicare HMO

## 2017-12-26 ENCOUNTER — Other Ambulatory Visit (HOSPITAL_COMMUNITY): Payer: Self-pay | Admitting: Internal Medicine

## 2017-12-29 ENCOUNTER — Encounter (HOSPITAL_COMMUNITY): Payer: Medicare HMO

## 2017-12-29 ENCOUNTER — Telehealth (HOSPITAL_COMMUNITY): Payer: Self-pay

## 2017-12-29 ENCOUNTER — Other Ambulatory Visit (HOSPITAL_COMMUNITY): Payer: Self-pay | Admitting: Internal Medicine

## 2017-12-29 MED ORDER — SERTRALINE HCL 25 MG PO TABS: 25.0000 mg | ORAL_TABLET | Freq: Every day | ORAL | 0 refills | Status: DC

## 2017-12-29 MED ORDER — APIXABAN 2.5 MG PO TABS: 2.5000 mg | ORAL_TABLET | Freq: Two times a day (BID) | ORAL | 11 refills | Status: DC

## 2017-12-29 NOTE — Telephone Encounter (Signed)
Daughter calling for refills on meds. Eliquis and Zoloft refilled to preferred pharmacy electronically.  Renee Pain, RN

## 2017-12-30 ENCOUNTER — Ambulatory Visit: Payer: Medicare HMO | Admitting: Family Medicine

## 2017-12-31 ENCOUNTER — Encounter (HOSPITAL_COMMUNITY): Payer: Medicare HMO

## 2018-01-01 DIAGNOSIS — E119 Type 2 diabetes mellitus without complications: Secondary | ICD-10-CM | POA: Diagnosis not present

## 2018-01-01 DIAGNOSIS — C9000 Multiple myeloma not having achieved remission: Secondary | ICD-10-CM | POA: Diagnosis not present

## 2018-01-01 DIAGNOSIS — I4891 Unspecified atrial fibrillation: Secondary | ICD-10-CM | POA: Diagnosis not present

## 2018-01-01 DIAGNOSIS — E1122 Type 2 diabetes mellitus with diabetic chronic kidney disease: Secondary | ICD-10-CM | POA: Diagnosis not present

## 2018-01-01 DIAGNOSIS — I429 Cardiomyopathy, unspecified: Secondary | ICD-10-CM | POA: Diagnosis not present

## 2018-01-01 DIAGNOSIS — K7460 Unspecified cirrhosis of liver: Secondary | ICD-10-CM | POA: Diagnosis not present

## 2018-01-01 DIAGNOSIS — I5043 Acute on chronic combined systolic (congestive) and diastolic (congestive) heart failure: Secondary | ICD-10-CM | POA: Diagnosis not present

## 2018-01-01 DIAGNOSIS — N183 Chronic kidney disease, stage 3 (moderate): Secondary | ICD-10-CM | POA: Diagnosis not present

## 2018-01-01 DIAGNOSIS — I130 Hypertensive heart and chronic kidney disease with heart failure and stage 1 through stage 4 chronic kidney disease, or unspecified chronic kidney disease: Secondary | ICD-10-CM | POA: Diagnosis not present

## 2018-01-01 DIAGNOSIS — D631 Anemia in chronic kidney disease: Secondary | ICD-10-CM | POA: Diagnosis not present

## 2018-01-02 ENCOUNTER — Encounter (HOSPITAL_COMMUNITY): Payer: Medicare HMO

## 2018-01-05 ENCOUNTER — Other Ambulatory Visit: Payer: Self-pay

## 2018-01-05 ENCOUNTER — Telehealth (HOSPITAL_COMMUNITY): Payer: Self-pay | Admitting: *Deleted

## 2018-01-05 ENCOUNTER — Ambulatory Visit (HOSPITAL_COMMUNITY)
Admission: RE | Admit: 2018-01-05 | Discharge: 2018-01-05 | Disposition: A | Discharge status: Home / Self Care | Payer: Medicare HMO | Source: Ambulatory Visit | Attending: Internal Medicine | Admitting: Internal Medicine

## 2018-01-05 VITALS — BP 114/66 | HR 42 | Wt 181.0 lb

## 2018-01-05 DIAGNOSIS — Z86010 Personal history of colonic polyps: Secondary | ICD-10-CM

## 2018-01-05 DIAGNOSIS — Z79890 Hormone replacement therapy (postmenopausal): Secondary | ICD-10-CM | POA: Diagnosis not present

## 2018-01-05 DIAGNOSIS — C9000 Multiple myeloma not having achieved remission: Secondary | ICD-10-CM

## 2018-01-05 DIAGNOSIS — Z803 Family history of malignant neoplasm of breast: Secondary | ICD-10-CM | POA: Diagnosis not present

## 2018-01-05 DIAGNOSIS — R0602 Shortness of breath: Secondary | ICD-10-CM | POA: Insufficient documentation

## 2018-01-05 DIAGNOSIS — M19042 Primary osteoarthritis, left hand: Secondary | ICD-10-CM | POA: Diagnosis present

## 2018-01-05 DIAGNOSIS — Z7901 Long term (current) use of anticoagulants: Secondary | ICD-10-CM

## 2018-01-05 DIAGNOSIS — R001 Bradycardia, unspecified: Secondary | ICD-10-CM

## 2018-01-05 DIAGNOSIS — K219 Gastro-esophageal reflux disease without esophagitis: Secondary | ICD-10-CM

## 2018-01-05 DIAGNOSIS — K2270 Barrett's esophagus without dysplasia: Secondary | ICD-10-CM | POA: Diagnosis present

## 2018-01-05 DIAGNOSIS — I482 Chronic atrial fibrillation: Secondary | ICD-10-CM | POA: Insufficient documentation

## 2018-01-05 DIAGNOSIS — Z96652 Presence of left artificial knee joint: Secondary | ICD-10-CM | POA: Diagnosis present

## 2018-01-05 DIAGNOSIS — M19041 Primary osteoarthritis, right hand: Secondary | ICD-10-CM | POA: Diagnosis present

## 2018-01-05 DIAGNOSIS — K449 Diaphragmatic hernia without obstruction or gangrene: Secondary | ICD-10-CM

## 2018-01-05 DIAGNOSIS — Z9049 Acquired absence of other specified parts of digestive tract: Secondary | ICD-10-CM | POA: Diagnosis not present

## 2018-01-05 DIAGNOSIS — I442 Atrioventricular block, complete: Principal | ICD-10-CM | POA: Diagnosis present

## 2018-01-05 DIAGNOSIS — Z85828 Personal history of other malignant neoplasm of skin: Secondary | ICD-10-CM

## 2018-01-05 DIAGNOSIS — R5383 Other fatigue: Secondary | ICD-10-CM | POA: Insufficient documentation

## 2018-01-05 DIAGNOSIS — Z9842 Cataract extraction status, left eye: Secondary | ICD-10-CM

## 2018-01-05 DIAGNOSIS — K7581 Nonalcoholic steatohepatitis (NASH): Secondary | ICD-10-CM | POA: Diagnosis present

## 2018-01-05 DIAGNOSIS — K625 Hemorrhage of anus and rectum: Secondary | ICD-10-CM | POA: Insufficient documentation

## 2018-01-05 DIAGNOSIS — N183 Chronic kidney disease, stage 3 (moderate): Secondary | ICD-10-CM | POA: Diagnosis present

## 2018-01-05 DIAGNOSIS — I43 Cardiomyopathy in diseases classified elsewhere: Secondary | ICD-10-CM | POA: Diagnosis not present

## 2018-01-05 DIAGNOSIS — J90 Pleural effusion, not elsewhere classified: Secondary | ICD-10-CM | POA: Diagnosis not present

## 2018-01-05 DIAGNOSIS — I4510 Unspecified right bundle-branch block: Secondary | ICD-10-CM | POA: Insufficient documentation

## 2018-01-05 DIAGNOSIS — R42 Dizziness and giddiness: Secondary | ICD-10-CM | POA: Diagnosis not present

## 2018-01-05 DIAGNOSIS — I5043 Acute on chronic combined systolic (congestive) and diastolic (congestive) heart failure: Secondary | ICD-10-CM | POA: Diagnosis not present

## 2018-01-05 DIAGNOSIS — E119 Type 2 diabetes mellitus without complications: Secondary | ICD-10-CM | POA: Diagnosis not present

## 2018-01-05 DIAGNOSIS — E859 Amyloidosis, unspecified: Secondary | ICD-10-CM | POA: Diagnosis present

## 2018-01-05 DIAGNOSIS — R9431 Abnormal electrocardiogram [ECG] [EKG]: Secondary | ICD-10-CM

## 2018-01-05 DIAGNOSIS — M1990 Unspecified osteoarthritis, unspecified site: Secondary | ICD-10-CM

## 2018-01-05 DIAGNOSIS — E854 Organ-limited amyloidosis: Secondary | ICD-10-CM | POA: Diagnosis not present

## 2018-01-05 DIAGNOSIS — I130 Hypertensive heart and chronic kidney disease with heart failure and stage 1 through stage 4 chronic kidney disease, or unspecified chronic kidney disease: Secondary | ICD-10-CM | POA: Diagnosis present

## 2018-01-05 DIAGNOSIS — I495 Sick sinus syndrome: Secondary | ICD-10-CM | POA: Diagnosis present

## 2018-01-05 DIAGNOSIS — I428 Other cardiomyopathies: Secondary | ICD-10-CM | POA: Diagnosis present

## 2018-01-05 DIAGNOSIS — E1122 Type 2 diabetes mellitus with diabetic chronic kidney disease: Secondary | ICD-10-CM | POA: Diagnosis present

## 2018-01-05 DIAGNOSIS — E7800 Pure hypercholesterolemia, unspecified: Secondary | ICD-10-CM

## 2018-01-05 DIAGNOSIS — Z811 Family history of alcohol abuse and dependence: Secondary | ICD-10-CM

## 2018-01-05 DIAGNOSIS — I5042 Chronic combined systolic (congestive) and diastolic (congestive) heart failure: Secondary | ICD-10-CM | POA: Diagnosis present

## 2018-01-05 DIAGNOSIS — Z8249 Family history of ischemic heart disease and other diseases of the circulatory system: Secondary | ICD-10-CM

## 2018-01-05 DIAGNOSIS — N179 Acute kidney failure, unspecified: Secondary | ICD-10-CM | POA: Diagnosis present

## 2018-01-05 DIAGNOSIS — Z79899 Other long term (current) drug therapy: Secondary | ICD-10-CM | POA: Diagnosis not present

## 2018-01-05 DIAGNOSIS — I5084 End stage heart failure: Secondary | ICD-10-CM | POA: Diagnosis present

## 2018-01-05 DIAGNOSIS — R110 Nausea: Secondary | ICD-10-CM | POA: Insufficient documentation

## 2018-01-05 DIAGNOSIS — Z66 Do not resuscitate: Secondary | ICD-10-CM

## 2018-01-05 DIAGNOSIS — I4892 Unspecified atrial flutter: Secondary | ICD-10-CM | POA: Insufficient documentation

## 2018-01-05 DIAGNOSIS — I498 Other specified cardiac arrhythmias: Secondary | ICD-10-CM

## 2018-01-05 DIAGNOSIS — Z961 Presence of intraocular lens: Secondary | ICD-10-CM | POA: Diagnosis present

## 2018-01-05 DIAGNOSIS — S0990XA Unspecified injury of head, initial encounter: Secondary | ICD-10-CM | POA: Diagnosis not present

## 2018-01-05 DIAGNOSIS — I8500 Esophageal varices without bleeding: Secondary | ICD-10-CM | POA: Diagnosis present

## 2018-01-05 DIAGNOSIS — Z23 Encounter for immunization: Secondary | ICD-10-CM | POA: Diagnosis present

## 2018-01-05 DIAGNOSIS — I429 Cardiomyopathy, unspecified: Secondary | ICD-10-CM | POA: Insufficient documentation

## 2018-01-05 DIAGNOSIS — W2203XA Walked into furniture, initial encounter: Secondary | ICD-10-CM | POA: Diagnosis present

## 2018-01-05 DIAGNOSIS — E876 Hypokalemia: Secondary | ICD-10-CM | POA: Diagnosis not present

## 2018-01-05 DIAGNOSIS — Z87891 Personal history of nicotine dependence: Secondary | ICD-10-CM

## 2018-01-05 DIAGNOSIS — E785 Hyperlipidemia, unspecified: Secondary | ICD-10-CM

## 2018-01-05 DIAGNOSIS — R55 Syncope and collapse: Secondary | ICD-10-CM | POA: Diagnosis not present

## 2018-01-05 DIAGNOSIS — J9811 Atelectasis: Secondary | ICD-10-CM | POA: Diagnosis not present

## 2018-01-05 DIAGNOSIS — I480 Paroxysmal atrial fibrillation: Secondary | ICD-10-CM | POA: Diagnosis present

## 2018-01-05 DIAGNOSIS — I700 Atherosclerosis of aorta: Secondary | ICD-10-CM | POA: Diagnosis present

## 2018-01-05 DIAGNOSIS — Z9841 Cataract extraction status, right eye: Secondary | ICD-10-CM

## 2018-01-05 DIAGNOSIS — I081 Rheumatic disorders of both mitral and tricuspid valves: Secondary | ICD-10-CM | POA: Diagnosis present

## 2018-01-05 DIAGNOSIS — I441 Atrioventricular block, second degree: Secondary | ICD-10-CM | POA: Diagnosis not present

## 2018-01-05 LAB — BASIC METABOLIC PANEL
Anion gap: 12 (ref 5–15)
BUN: 28 mg/dL — ABNORMAL HIGH (ref 8–23)
CALCIUM: 9.3 mg/dL (ref 8.9–10.3)
CO2: 29 mmol/L (ref 22–32)
CREATININE: 2.04 mg/dL — AB (ref 0.61–1.24)
Chloride: 100 mmol/L (ref 98–111)
GFR calc Af Amer: 34 mL/min — ABNORMAL LOW (ref >60)
GFR, EST NON AFRICAN AMERICAN: 30 mL/min — AB (ref >60)
Glucose, Bld: 181 mg/dL — ABNORMAL HIGH (ref 70–99)
Potassium: 3.6 mmol/L (ref 3.5–5.1)
SODIUM: 141 mmol/L (ref 135–145)

## 2018-01-05 LAB — BRAIN NATRIURETIC PEPTIDE: B NATRIURETIC PEPTIDE 5: 516.7 pg/mL — AB (ref 0.0–100.0)

## 2018-01-05 MED ORDER — AMIODARONE HCL 200 MG PO TABS: 200.0000 mg | ORAL_TABLET | Freq: Every day | ORAL | 6 refills | Status: DC

## 2018-01-05 NOTE — Telephone Encounter (Signed)
pts daughter called to report pts bp of 112/57, heart rate 42, and states patient just "doesn't feel well". Per Jinny Blossom pt may be in afib and needs to be seen. Pt added to clinic schedule today at 2pm.

## 2018-01-05 NOTE — Patient Instructions (Signed)
Labs today (will call for abnormal results, otherwise no news is good news)  HOLD Amiodarone for two days.  Then decrease to 200 mg Once Daily starting on Thursday.    Follow up in clinic for EKG on Wednesday.

## 2018-01-05 NOTE — Progress Notes (Signed)
Advanced Heart Failure Clinic Note   Referring Physician: PCP: [name], MD PCP-Cardiologist: Glenetta Hew, MD  Primary HF: Dr. [name]   HPI: Dillon Andrews is a 77 y.o. malewith a hx of chronic combined CHF due to NICM, CKD III,presumedNASH(per pt, never proven),esophageal varices per chart(prior glass of wine occasionally but no heavy alcohol),HTN, HLD, DM, Barrett's esophagus, GERD, anemia, RBBB  He was admitted 3/13-3/17/19 for A/C combined HF. While inpatient, SPEP and UPEP sent for concerns for cardiac amyloidosis. He diuresed with IV lasix and was transitioned to lasix 80 mg q am, 40 mg q pm. DC weight 180 lbs.   NICM by cath at [facility] 09/2016 with EF 40-45%. Echo 06/2017: EF 25-30%,NICM by cath at [facility] 09/2016 with EF 40-45%. Echo 06/2017: EF 25-30%,  His SPEP came back with m-spike 0.8% and he was referred to Dr Beryle Beams. UPEP with Bence Jones Protein positive; kappa type.   PYP scan completed on 3/20 strongly suggestive of transthyretin amyloidosis. Genetic testing negative.  Seen in clinic on 3/26 and transitioned from lasix to torsemide. Admitted 3/28-3/31 with URI and A/C HF. URI treated with antibiotics. Diuresed with IV lasix, then transitioned to torsemide 40 mg BID. Genetic testing sent.   Admitted 6/38-4/66/59 with A/C systolic HF. He was diuresed with IV lasix. He required milrinone for low output. He was in afib RVR and was cardioverted twice. He was treated for sepsis. He had AKI on CKD that resolved prior to discharge. He also had rectal bleeding and required 1 unit RBCs. Eliquis was held and then dose was reduced. Palliative met with him and he was made DNR/I. Discharged home with milrinone.   Admitted 8/20-8/22/19 with fever and presumed HCAP. PCT was negative. He did not need abx at discharge. HF team consulted. He was stable from HF perspective. Continued milrinone, torsemide, and tafamidis. He was back in rate controlled atrial flutter, but  was asymptomatic. DC weight 174 lbs.   Today she presents for HF follow up. Complaining of fatigue, nausea, and increased shortness of breath. Feels weak. No syncope or presyncope.  He has had poor urine output so he took 1 extra torsemide. Today he took 3 extra torsemide. Weight has not gone down. He has not had much urine output. Able to walk around the house without difficulty. Does admit to mild dyspnea with ADLs. No fever or chills. Poor appetite. Weight at home 171-176 pounds.No swelling in his legs.  [facility] following for home milrinone.    Review of systems complete and found to be negative unless listed in HPI.   Past Medical History:  Diagnosis Date  . Anemia 11/2016  . Arthritis    "some; hands/fingers" (06/25/2017)  . Atrial tachycardia ([place])   . Barrett's esophagus   . Basal cell carcinoma    "face, arms, hands" (06/25/2017)  . Chicken pox   . Cholelithiasis 11/2016   S/P LAP CHOLE  . Chronic combined systolic and diastolic CHF (congestive heart failure) ([place])   . CKD (chronic kidney disease), stage III ([place])   . Esophageal varices (HCC)    Secondary to NAFLD  . Family history of adverse reaction to anesthesia    "daughter's sat drop very low; last OR had to be reintubated" (06/25/2017)  . GERD (gastroesophageal reflux disease)   . Goals of care, counseling/discussion 10/22/2017  . High cholesterol   . Hypertension   . Kappa light chain myeloma ([place]) 10/22/2017  . Mitral regurgitation   . NAFLD (nonalcoholic fatty liver disease)   .  NICM (nonischemic cardiomyopathy) ([place])    Echo 06/2017: Severe LVH.  EF 25 to 30% with mild HK of the baseline after septal wall.  Moderate to severely dilated right atrium, moderately dilated RV. (Concern fr ? amyloidosis).  . NSVT (nonsustained ventricular tachycardia) ([place])   . Pneumonia 09/2016  . RBBB   . Tricuspid regurgitation   . Type II diabetes mellitus ([place])     Current Outpatient Medications  Medication Sig Dispense Refill  .  amiodarone (PACERONE) 200 MG tablet Take 1 tablet (200 mg total) by mouth 2 (two) times daily. 60 tablet 6  . apixaban (ELIQUIS) 2.5 MG TABS tablet Take 1 tablet (2.5 mg total) by mouth 2 (two) times daily. 60 tablet 11  . dronabinol (MARINOL) 5 MG capsule Take 1 capsule (5 mg total) by mouth 2 (two) times daily before lunch and supper. 60 capsule 2  . gabapentin (NEURONTIN) 800 MG tablet TAKE 1 TABLET BY MOUTH AT BEDTIME (Patient taking differently: Take 400 mg by mouth at bedtime. ) 90 tablet 1  . glucose blood test strip Use as instructed 100 each 12  . hydrocortisone (ANUSOL-HC) 25 MG suppository Place 1 suppository (25 mg total) rectally 2 (two) times daily. (Patient taking differently: Place 25 mg rectally 2 (two) times daily as needed for hemorrhoids. ) 12 suppository 0  . magnesium oxide (MAG-OX) 400 MG tablet Take 400 mg by mouth daily.    . Melatonin 5 MG TABS Take 5 mg by mouth at bedtime.     . milrinone (PRIMACOR) 20 MG/100 ML SOLN infusion Inject 0.0099 mg/min into the vein continuous. 100 mL 0  . omeprazole (PRILOSEC) 40 MG capsule Take 1 capsule (40 mg total) by mouth daily. 90 capsule 1  . ondansetron (ZOFRAN) 4 MG tablet Take 1 tablet (4 mg total) by mouth every 8 (eight) hours as needed for nausea or vomiting. 30 tablet 2  . polyethylene glycol (MIRALAX / GLYCOLAX) packet Take 17 g by mouth daily. (Patient taking differently: Take 17 g by mouth daily as needed (constipation). Mix with 8 oz orange juice and drink) 14 each 0  . potassium chloride SA (K-DUR,KLOR-CON) 20 MEQ tablet Take 20 mEq by mouth 2 (two) times daily.    . Probiotic Product (PROBIOTIC DAILY PO) Take 1 tablet by mouth daily.     . rosuvastatin (CRESTOR) 10 MG tablet TAKE 1 TABLET BY MOUTH ONCE DAILY (Patient taking differently: Take 10 mg by mouth daily. ) 90 tablet 0  . senna-docusate (SENOKOT-S) 8.6-50 MG tablet Take 2 tablets by mouth 2 (two) times daily. (Patient taking differently: Take 1 tablet by mouth See  admin instructions. Take one tablet by mouth every morning, may take another tablet in the evening as needed for constipation) 20 tablet 0  . sertraline (ZOLOFT) 25 MG tablet TAKE 1 TABLET BY MOUTH EVERY DAY 90 tablet 0  . torsemide (DEMADEX) 20 MG tablet Take 1 tablet (20 mg total) by mouth daily. Can take 20 mg extra as needed. (Patient taking differently: Take 20 mg by mouth See admin instructions. Take one tablet (20 mg) by mouth every morning, may also take one tablet in the afternoon as needed for swelling) 40 tablet 3  . traZODone (DESYREL) 50 MG tablet TAKE 1 TABLET(50 MG) BY MOUTH AT BEDTIME (Patient taking differently: Take 50 mg by mouth at bedtime. ) 90 tablet 0  . vitamin B-12 (CYANOCOBALAMIN) 500 MCG tablet Take 500 mcg by mouth every other day.    Marland Kitchen VYNDAQEL  20 MG CAPS Take 80 mg by mouth daily. (4 capsules) (Patient taking differently: Take 80 mg by mouth every evening. (4 capsules)) 120 capsule   . zolpidem (AMBIEN) 5 MG tablet TAKE 1 TABLET BY MOUTH EVERY NIGHT AT BEDTIME AS NEEDED FOR SLEEP (Patient taking differently: Take 5 mg by mouth at bedtime. ) 30 tablet 1   No current facility-administered medications for this encounter.     No Known Allergies    Social History   Socioeconomic History  . Marital status: Married    Spouse name: Not on file  . Number of children: 4  . Years of education: 41  . Highest education level: Not on file  Occupational History  . Occupation: retired  Scientific laboratory technician  . Financial resource strain: Not on file  . Food insecurity:    Worry: Not on file    Inability: Not on file  . Transportation needs:    Medical: Not on file    Non-medical: Not on file  Tobacco Use  . Smoking status: Former Smoker    Packs/day: 0.30    Years: 15.00    Pack years: 4.50    Types: Cigarettes    Last attempt to quit: 03/17/1977    Years since quitting: 40.8  . Smokeless tobacco: Never Used  Substance and Sexual Activity  . Alcohol use: Yes    Comment:  06/25/2017 "nothing since 09/2016;  used to have glass of wine q hs"  . Drug use: No  . Sexual activity: Not on file  Lifestyle  . Physical activity:    Days per week: Not on file    Minutes per session: Not on file  . Stress: Not on file  Relationships  . Social connections:    Talks on phone: Not on file    Gets together: Not on file    Attends religious service: Not on file    Active member of club or organization: Not on file    Attends meetings of clubs or organizations: Not on file    Relationship status: Not on file  . Intimate partner violence:    Fear of current or ex partner: Not on file    Emotionally abused: Not on file    Physically abused: Not on file    Forced sexual activity: Not on file  Other Topics Concern  . Not on file  Social History Narrative   He is retired from [employer].  Previously worked for [employer] by his daughter --> also a patient of Dr. Ellyn Hack   Lives with wife in a one story home.    Education: high school.       Family History  Problem Relation Age of Onset  . Heart failure Mother   . Cancer Father   . Cataracts Father   . Breast cancer Sister   . Alcohol abuse Brother   . Liver disease Brother   . Autoimmune disease Daughter        Bechet's syndrome  . Cirrhosis Neg Hx    Vitals:   01/05/18 1358  BP: 114/66  Pulse: (!) 42  SpO2: 98%  Weight: 82.1 kg (181 lb)     Wt Readings from Last 3 Encounters:  01/05/18 82.1 kg (181 lb)  12/16/17 78.8 kg (173 lb 12 oz)  12/12/17 80 kg (176 lb 6 oz)    PHYSICAL EXAM: General:  Walked into clinic . No resp difficulty HEENT: normal Neck: supple. JVP 6-7. Carotids 2+  bilat; no bruits. No lymphadenopathy or thryomegaly appreciated. Cor: PMI nondisplaced. Loletha Grayer regular . No rubs, gallops or murmurs. R tunneled PICC  Lungs: clear Abdomen: soft, nontender, nondistended. No hepatosplenomegaly. No bruits or masses. Good bowel sounds. Extremities: no cyanosis,  clubbing, rash, edema Neuro: alert & orientedx3, cranial nerves grossly intact. moves all 4 extremities w/o difficulty. Affect pleasant  EKG: Junction Rhythm 43 bpm RBBB 178 ms.    ASSESSMENT & PLAN:   1. 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  - Echo 06/2017: EF 25-30%, mild LAE, mild/mod RV dilation, mod/severe RA. -PYP scan strongly suggestive of TTR amyloid, SPEP with 0.8% m-spike, UPEP with Bence Jones Protein positive, kappa type. 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 - Dr Marin Olp saw and thinks he does not have AL amyloid (just multiple myeloma) and that he has TTR amyloid and should be treated with tafamidis before chemo. Tafamidis started on 8/2.  -NYHA IIIb. Volume status stable. Continue 20 mg torsemide daily with an extra 20 mg if needed.  - Continue milrinone 0.125 mcg. Managed by [facility] - Continue tafamidis 80 mg daily - No ARB/entresto/spiro with for now with CKD - No BB with low output.  - SBP soft. Hold off on hydralazine/imdur - Discussed limiting fluid and salt intake.   2. CKD III, baseline ~1.6 Had labs 9/19 . Not yet scanned.    3. Atrial Flutter s/p DCCV 7/31, 8/2, 12/19/2017 ->nowin junctional rhythm with symptomatic bradycardia  - Hold amio for 2 days then start amio 200 mg daily on Wednesday.   - Continue Eliquis 2.5 mg BID - 4. HL - Continue crestor.   5. [place] with Dr. Marin Olp  -Started on Bortezomib7/18/18andRevlimid7/23/19. - Dr Marin Olp thinks he does not have AL amyloid and that he does have TTR amyloid and should be treated with tafamodis before chemo. Now on Tafamidis.  - Encouraged them to follow up with Dr Marin Olp  6. NASH with esophageal varices.  - [facility] surveillance of Esophageal varices, Barrett's esophagus, and PMHx of Colon polyps. Showed two columns of small esophageal varices w/out stigmata, short segment  Barrett's esophagus (without biopsies, due to presence of EVs), 5cm hiatal hernia, and hyperplastic small gastric nodules.  - Continue low dose eliquis 2.5 mg BID for afib.  7. Hx of Rectal bleeding - EGD/Colon deferred with recent (2017) showing diverticulosis and hemorrhoids. - Denies bleeding.   Return to clinic on Wednesday for EKG.   Darrick Grinder, NP 01/05/18   Patient seen and examined with the above-signed Advanced Practice Provider and/or Housestaff. I personally reviewed laboratory data, imaging studies and relevant notes. I independently examined the patient and formulated the important aspects of the plan. I have edited the note to reflect any of my changes or salient points. I have personally discussed the plan with the patient and/or family.  He presents for acute visit due to weakness. Volume status doe not look too bad. ECG shows junctional rhythm in low 40s. Will hold amio for 2 days then decrease to 200 daily. Volume status looks ok thought weight is up and I worry he may be developing low output. Check labs today and we will see him back in 2 days for ECG.   Glori Bickers, MD  10:44 PM  Addendum:  K 3.6. Creatine up slightly at 2.0. Will follow.  Glori Bickers MD

## 2018-01-06 ENCOUNTER — Encounter (HOSPITAL_COMMUNITY): Payer: Self-pay | Admitting: Emergency Medicine

## 2018-01-06 ENCOUNTER — Other Ambulatory Visit: Payer: Self-pay

## 2018-01-06 ENCOUNTER — Inpatient Hospital Stay (HOSPITAL_COMMUNITY)
Admission: EM | Admit: 2018-01-06 | Discharge: 2018-01-09 | DRG: 243 | Disposition: A | Discharge status: Home Health | Payer: Medicare HMO | Attending: Internal Medicine | Admitting: Internal Medicine

## 2018-01-06 ENCOUNTER — Emergency Department (HOSPITAL_COMMUNITY): Payer: Medicare HMO

## 2018-01-06 DIAGNOSIS — E1122 Type 2 diabetes mellitus with diabetic chronic kidney disease: Secondary | ICD-10-CM | POA: Diagnosis present

## 2018-01-06 DIAGNOSIS — W2203XA Walked into furniture, initial encounter: Secondary | ICD-10-CM | POA: Diagnosis present

## 2018-01-06 DIAGNOSIS — N183 Chronic kidney disease, stage 3 (moderate): Secondary | ICD-10-CM | POA: Diagnosis present

## 2018-01-06 DIAGNOSIS — Z96652 Presence of left artificial knee joint: Secondary | ICD-10-CM | POA: Diagnosis present

## 2018-01-06 DIAGNOSIS — I8500 Esophageal varices without bleeding: Secondary | ICD-10-CM | POA: Diagnosis present

## 2018-01-06 DIAGNOSIS — Z79890 Hormone replacement therapy (postmenopausal): Secondary | ICD-10-CM | POA: Diagnosis not present

## 2018-01-06 DIAGNOSIS — Z7901 Long term (current) use of anticoagulants: Secondary | ICD-10-CM | POA: Diagnosis not present

## 2018-01-06 DIAGNOSIS — Z9049 Acquired absence of other specified parts of digestive tract: Secondary | ICD-10-CM | POA: Diagnosis not present

## 2018-01-06 DIAGNOSIS — Z79899 Other long term (current) drug therapy: Secondary | ICD-10-CM | POA: Diagnosis not present

## 2018-01-06 DIAGNOSIS — I5042 Chronic combined systolic (congestive) and diastolic (congestive) heart failure: Secondary | ICD-10-CM | POA: Diagnosis present

## 2018-01-06 DIAGNOSIS — Z8249 Family history of ischemic heart disease and other diseases of the circulatory system: Secondary | ICD-10-CM | POA: Diagnosis not present

## 2018-01-06 DIAGNOSIS — E854 Organ-limited amyloidosis: Secondary | ICD-10-CM | POA: Diagnosis not present

## 2018-01-06 DIAGNOSIS — I442 Atrioventricular block, complete: Secondary | ICD-10-CM | POA: Diagnosis present

## 2018-01-06 DIAGNOSIS — R55 Syncope and collapse: Secondary | ICD-10-CM

## 2018-01-06 DIAGNOSIS — R001 Bradycardia, unspecified: Secondary | ICD-10-CM | POA: Diagnosis present

## 2018-01-06 DIAGNOSIS — N179 Acute kidney failure, unspecified: Secondary | ICD-10-CM | POA: Diagnosis present

## 2018-01-06 DIAGNOSIS — Z23 Encounter for immunization: Secondary | ICD-10-CM | POA: Diagnosis present

## 2018-01-06 DIAGNOSIS — I428 Other cardiomyopathies: Secondary | ICD-10-CM | POA: Diagnosis present

## 2018-01-06 DIAGNOSIS — K219 Gastro-esophageal reflux disease without esophagitis: Secondary | ICD-10-CM | POA: Diagnosis present

## 2018-01-06 DIAGNOSIS — K7581 Nonalcoholic steatohepatitis (NASH): Secondary | ICD-10-CM | POA: Diagnosis not present

## 2018-01-06 DIAGNOSIS — Z85828 Personal history of other malignant neoplasm of skin: Secondary | ICD-10-CM | POA: Diagnosis not present

## 2018-01-06 DIAGNOSIS — I43 Cardiomyopathy in diseases classified elsewhere: Secondary | ICD-10-CM | POA: Diagnosis not present

## 2018-01-06 DIAGNOSIS — I441 Atrioventricular block, second degree: Secondary | ICD-10-CM | POA: Diagnosis not present

## 2018-01-06 DIAGNOSIS — Z95818 Presence of other cardiac implants and grafts: Secondary | ICD-10-CM

## 2018-01-06 DIAGNOSIS — Z811 Family history of alcohol abuse and dependence: Secondary | ICD-10-CM | POA: Diagnosis not present

## 2018-01-06 DIAGNOSIS — K2270 Barrett's esophagus without dysplasia: Secondary | ICD-10-CM | POA: Diagnosis present

## 2018-01-06 DIAGNOSIS — M19042 Primary osteoarthritis, left hand: Secondary | ICD-10-CM | POA: Diagnosis present

## 2018-01-06 DIAGNOSIS — M19041 Primary osteoarthritis, right hand: Secondary | ICD-10-CM | POA: Diagnosis present

## 2018-01-06 DIAGNOSIS — C9000 Multiple myeloma not having achieved remission: Secondary | ICD-10-CM | POA: Diagnosis present

## 2018-01-06 DIAGNOSIS — Z803 Family history of malignant neoplasm of breast: Secondary | ICD-10-CM | POA: Diagnosis not present

## 2018-01-06 DIAGNOSIS — E859 Amyloidosis, unspecified: Secondary | ICD-10-CM | POA: Diagnosis present

## 2018-01-06 DIAGNOSIS — E876 Hypokalemia: Secondary | ICD-10-CM | POA: Diagnosis not present

## 2018-01-06 DIAGNOSIS — I130 Hypertensive heart and chronic kidney disease with heart failure and stage 1 through stage 4 chronic kidney disease, or unspecified chronic kidney disease: Secondary | ICD-10-CM | POA: Diagnosis present

## 2018-01-06 LAB — BASIC METABOLIC PANEL
ANION GAP: 14 (ref 5–15)
BUN: 33 mg/dL — ABNORMAL HIGH (ref 8–23)
CALCIUM: 9.3 mg/dL (ref 8.9–10.3)
CHLORIDE: 98 mmol/L (ref 98–111)
CO2: 29 mmol/L (ref 22–32)
Creatinine, Ser: 2.11 mg/dL — ABNORMAL HIGH (ref 0.61–1.24)
GFR calc non Af Amer: 28 mL/min — ABNORMAL LOW (ref >60)
GFR, EST AFRICAN AMERICAN: 33 mL/min — AB (ref >60)
Glucose, Bld: 141 mg/dL — ABNORMAL HIGH (ref 70–99)
Potassium: 4 mmol/L (ref 3.5–5.1)
Sodium: 141 mmol/L (ref 135–145)

## 2018-01-06 LAB — URINALYSIS, ROUTINE W REFLEX MICROSCOPIC
Bilirubin Urine: NEGATIVE
Glucose, UA: NEGATIVE mg/dL
HGB URINE DIPSTICK: NEGATIVE
Ketones, ur: NEGATIVE mg/dL
Leukocytes, UA: NEGATIVE
Nitrite: NEGATIVE
Protein, ur: NEGATIVE mg/dL
SPECIFIC GRAVITY, URINE: 1.008 (ref 1.005–1.030)
pH: 6 (ref 5.0–8.0)

## 2018-01-06 LAB — CBC
HCT: 37.9 % — ABNORMAL LOW (ref 39.0–52.0)
HEMOGLOBIN: 11.7 g/dL — AB (ref 13.0–17.0)
MCH: 30.3 pg (ref 26.0–34.0)
MCHC: 30.9 g/dL (ref 30.0–36.0)
MCV: 98.2 fL (ref 78.0–100.0)
Platelets: 126 10*3/uL — ABNORMAL LOW (ref 150–400)
RBC: 3.86 MIL/uL — AB (ref 4.22–5.81)
RDW: 24 % — ABNORMAL HIGH (ref 11.5–15.5)
WBC: 8.8 10*3/uL (ref 4.0–10.5)

## 2018-01-06 LAB — I-STAT TROPONIN, ED: TROPONIN I, POC: 0.36 ng/mL — AB (ref 0.00–0.08)

## 2018-01-06 LAB — MRSA PCR SCREENING: MRSA by PCR: NEGATIVE

## 2018-01-06 LAB — CBG MONITORING, ED: Glucose-Capillary: 139 mg/dL — ABNORMAL HIGH (ref 70–99)

## 2018-01-06 MED ORDER — POLYETHYLENE GLYCOL 3350 17 G PO PACK
17.0000 g | PACK | Freq: Every day | ORAL | Status: DC
  Filled 2018-01-06 (×2): qty 1

## 2018-01-06 MED ORDER — DRONABINOL 2.5 MG PO CAPS
5.0000 mg | ORAL_CAPSULE | Freq: Two times a day (BID) | ORAL | Status: DC
  Administered 2018-01-07 – 2018-01-09 (×5): 5 mg via ORAL
  Filled 2018-01-06 (×5): qty 2

## 2018-01-06 MED ORDER — ACETAMINOPHEN 325 MG PO TABS
650.0000 mg | ORAL_TABLET | ORAL | Status: DC | PRN
  Administered 2018-01-08 – 2018-01-09 (×2): 650 mg via ORAL
  Filled 2018-01-06 (×2): qty 2

## 2018-01-06 MED ORDER — ATROPINE SULFATE 1 MG/10ML IJ SOSY
PREFILLED_SYRINGE | INTRAMUSCULAR | Status: AC
  Filled 2018-01-06: qty 10

## 2018-01-06 MED ORDER — TRAZODONE HCL 50 MG PO TABS
50.0000 mg | ORAL_TABLET | Freq: Every day | ORAL | Status: DC
  Administered 2018-01-06 – 2018-01-08 (×3): 50 mg via ORAL
  Filled 2018-01-06 (×3): qty 1

## 2018-01-06 MED ORDER — CYANOCOBALAMIN 500 MCG PO TABS
500.0000 ug | ORAL_TABLET | ORAL | Status: DC
  Administered 2018-01-08: 500 ug via ORAL
  Filled 2018-01-06 (×2): qty 1

## 2018-01-06 MED ORDER — GABAPENTIN 800 MG PO TABS
400.0000 mg | ORAL_TABLET | Freq: Every day | ORAL | Status: DC
  Filled 2018-01-06: qty 0.5

## 2018-01-06 MED ORDER — SENNOSIDES-DOCUSATE SODIUM 8.6-50 MG PO TABS: 1.0000 | ORAL_TABLET | ORAL | Status: DC

## 2018-01-06 MED ORDER — YOU HAVE A PACEMAKER BOOK
Freq: Once | Status: AC
  Administered 2018-01-07: 06:00:00
  Filled 2018-01-06: qty 1

## 2018-01-06 MED ORDER — SODIUM CHLORIDE 0.9% FLUSH: 3.0000 mL | INTRAVENOUS | Status: DC | PRN

## 2018-01-06 MED ORDER — TAFAMIDIS MEGLUMINE (CARDIAC) 20 MG PO CAPS
80.0000 mg | ORAL_CAPSULE | Freq: Every evening | ORAL | Status: DC
  Administered 2018-01-07 – 2018-01-08 (×2): 80 mg via ORAL
  Filled 2018-01-06 (×5): qty 4

## 2018-01-06 MED ORDER — PANTOPRAZOLE SODIUM 40 MG PO TBEC
80.0000 mg | DELAYED_RELEASE_TABLET | Freq: Every day | ORAL | Status: DC
  Administered 2018-01-07 – 2018-01-09 (×3): 80 mg via ORAL
  Filled 2018-01-06 (×3): qty 2

## 2018-01-06 MED ORDER — RISAQUAD PO CAPS
ORAL_CAPSULE | Freq: Every day | ORAL | Status: DC
  Administered 2018-01-07 – 2018-01-09 (×3): 1 via ORAL
  Filled 2018-01-06 (×3): qty 1

## 2018-01-06 MED ORDER — MILRINONE LACTATE IN DEXTROSE 20-5 MG/100ML-% IV SOLN
0.1250 ug/kg/min | INTRAVENOUS | Status: DC
  Administered 2018-01-06 – 2018-01-09 (×2): 0.125 ug/kg/min via INTRAVENOUS
  Filled 2018-01-06 (×3): qty 100

## 2018-01-06 MED ORDER — ROSUVASTATIN CALCIUM 10 MG PO TABS
10.0000 mg | ORAL_TABLET | Freq: Every day | ORAL | Status: DC
  Administered 2018-01-07 – 2018-01-09 (×3): 10 mg via ORAL
  Filled 2018-01-06 (×3): qty 1

## 2018-01-06 MED ORDER — ONDANSETRON HCL 4 MG PO TABS: 4.0000 mg | ORAL_TABLET | Freq: Three times a day (TID) | ORAL | Status: DC | PRN

## 2018-01-06 MED ORDER — ONDANSETRON HCL 4 MG/2ML IJ SOLN: 4.0000 mg | Freq: Four times a day (QID) | INTRAMUSCULAR | Status: DC | PRN

## 2018-01-06 MED ORDER — SODIUM CHLORIDE 0.9% FLUSH
3.0000 mL | Freq: Two times a day (BID) | INTRAVENOUS | Status: DC
  Administered 2018-01-06 – 2018-01-08 (×4): 3 mL via INTRAVENOUS

## 2018-01-06 MED ORDER — GABAPENTIN 400 MG PO CAPS
400.0000 mg | ORAL_CAPSULE | Freq: Every day | ORAL | Status: DC
  Administered 2018-01-06 – 2018-01-08 (×3): 400 mg via ORAL
  Filled 2018-01-06 (×3): qty 1

## 2018-01-06 MED ORDER — SERTRALINE HCL 25 MG PO TABS
25.0000 mg | ORAL_TABLET | Freq: Every day | ORAL | Status: DC
  Administered 2018-01-07 – 2018-01-09 (×3): 25 mg via ORAL
  Filled 2018-01-06 (×3): qty 1

## 2018-01-06 MED ORDER — MELATONIN 3 MG PO TABS
3.0000 mg | ORAL_TABLET | Freq: Every day | ORAL | Status: DC
  Administered 2018-01-06 – 2018-01-08 (×3): 3 mg via ORAL
  Filled 2018-01-06 (×3): qty 1

## 2018-01-06 MED ORDER — ZOLPIDEM TARTRATE 5 MG PO TABS
5.0000 mg | ORAL_TABLET | Freq: Every evening | ORAL | Status: DC | PRN
  Administered 2018-01-06 – 2018-01-08 (×3): 5 mg via ORAL
  Filled 2018-01-06 (×3): qty 1

## 2018-01-06 MED ORDER — SODIUM CHLORIDE 0.9 % IV SOLN: 250.0000 mL | INTRAVENOUS | Status: DC | PRN

## 2018-01-06 MED ORDER — SENNOSIDES-DOCUSATE SODIUM 8.6-50 MG PO TABS
1.0000 | ORAL_TABLET | Freq: Every morning | ORAL | Status: DC
  Administered 2018-01-07 – 2018-01-08 (×2): 1 via ORAL
  Filled 2018-01-06 (×2): qty 1

## 2018-01-06 MED ORDER — SENNOSIDES-DOCUSATE SODIUM 8.6-50 MG PO TABS: 1.0000 | ORAL_TABLET | Freq: Every evening | ORAL | Status: DC | PRN

## 2018-01-06 NOTE — Consult Note (Deleted)
Advanced Heart Failure Team Consult Note   Primary Physician: [name], MD PCP-Cardiologist:  Glenetta Hew, MD  Reason for Consultation: Dr. Dayna Barker  HPI:    Dillon Andrews is seen today for evaluation of syncope at the request of Dr. Dayna Barker.   Dillon Andrews is a 77 y.o. male with a hx of chronic combined CHF due to NICM, CKD III,presumedNASH(per pt, never proven),esophageal varices per chart(prior glass of wine occasionally but no heavy alcohol),HTN, HLD, DM, Barrett's esophagus, GERD, anemia, RBBB  Seen in HF clinic 01/05/18 as add on with complaints of fatigue, nausea, weakness, and SOB. At that time, he reported no syncope or presycnope. EKG showed junctional rhythm 43 bpm with RBBB at 178 ms. Amiodarone held, but planned on restarting at decreased dose of 200 mg daily 01/07/18.  Pt presented to [facility] this afternoon due to syncopal episode. He was sitting on the couch when it happened. Fell to the side hitting his head on the end table. No laceration. Pt also has nausea and dizziness that has been ongoing since this am. Tele with junctional rhythm in the 40s.  Labs on admission include K 4.0, Cr 2.1 (Slightly up from baseline was 1.4-1.6 in 11/2017), WBC 8.8, Hgb 11.7. Head CT with atrophy and mild/mod small vessel ischemic changes with no acute intracranial abnormality.   He is feeling better currently. Per daughters, he remains fatigued with very poor appetite. Not eating or drinking much. He is lightheaded with standing. He was sitting on couch as above when he felt very dizzy, and then woke up on the floor with his friend asking him what was wrong. He hit the front of his head. Denies pain. No SOB. He denies orthopnea or PND. No HA, slurred speech, visual changes, or unilateral weakness.   Echo 06/2017: EF 25-30%, mild LAE, mild/mod RV dilation, mod/severe RA.  Review of Systems: [y] = yes, [ ]  = no   General: Weight gain [ ] ; Weight loss [ ] ; Anorexia [ ] ;  Fatigue [ ] ; Fever [ ] ; Chills [ ] ; Weakness [y]  Cardiac: Chest pain/pressure [ ] ; Resting SOB [ ] ; Exertional SOB [y]; Orthopnea [ ] ; Pedal Edema [y]; Palpitations [ ] ; Syncope [y]; Presyncope [ ] ; Paroxysmal nocturnal dyspnea[ ]   Pulmonary: Cough [ ] ; Wheezing[ ] ; Hemoptysis[ ] ; Sputum [ ] ; Snoring [ ]   GI: Vomiting[ ] ; Dysphagia[ ] ; Melena[ ] ; Hematochezia [ ] ; Heartburn[ ] ; Abdominal pain [ ] ; Constipation [ ] ; Diarrhea [ ] ; BRBPR [ ]   GU: Hematuria[ ] ; Dysuria [ ] ; Nocturia[ ]   Vascular: Pain in legs with walking [ ] ; Pain in feet with lying flat [ ] ; Non-healing sores [ ] ; Stroke [ ] ; TIA [ ] ; Slurred speech [ ] ;  Neuro: Headaches[ ] ; Vertigo[ ] ; Seizures[ ] ; Paresthesias[ ] ;Blurred vision [ ] ; Diplopia [ ] ; Vision changes [ ]   Ortho/Skin: Arthritis [y]; Joint pain [y]; Muscle pain [ ] ; Joint swelling [ ] ; Back Pain [ ] ; Rash [ ]   Psych: Depression[ ] ; Anxiety[ ]   Heme: Bleeding problems [ ] ; Clotting disorders [ ] ; Anemia [ ]   Endocrine: Diabetes [ ] ; Thyroid dysfunction[ ]   Home Medications Prior to Admission medications   Medication Sig Start Date End Date Taking? Authorizing Provider  amiodarone (PACERONE) 200 MG tablet Take 1 tablet (200 mg total) by mouth daily. 01/05/18  Yes Bensimhon, Shaune Pascal, MD  apixaban (ELIQUIS) 2.5 MG TABS tablet Take 1 tablet (2.5 mg total) by mouth 2 (two) times daily. 12/29/17  Yes Bensimhon, Shaune Pascal, MD  dronabinol (MARINOL) 5 MG capsule Take 1 capsule (5 mg total) by mouth 2 (two) times daily before lunch and supper. 12/10/17  Yes Volanda Napoleon, MD  gabapentin (NEURONTIN) 800 MG tablet TAKE 1 TABLET BY MOUTH AT BEDTIME Patient taking differently: Take 400 mg by mouth at bedtime.  09/23/17  Yes [name], MD  hydrocortisone (ANUSOL-HC) 25 MG suppository Place 1 suppository (25 mg total) rectally 2 (two) times daily. Patient taking differently: Place 25 mg rectally 2 (two) times daily as needed for hemorrhoids.  11/27/17  Yes Nita Sells, MD  Melatonin 5 MG TABS Take 5 mg by mouth at bedtime.    Yes [provider]  milrinone (PRIMACOR) 20 MG/100 ML SOLN infusion Inject 0.0099 mg/min into the vein continuous. 11/27/17  Yes Shirley Friar, PA-C  omeprazole (PRILOSEC) 40 MG capsule Take 1 capsule (40 mg total) by mouth daily. 07/31/17  Yes [name], MD  ondansetron (ZOFRAN) 4 MG tablet Take 1 tablet (4 mg total) by mouth every 8 (eight) hours as needed for nausea or vomiting. 12/16/17  Yes Ennever, Rudell Cobb, MD  polyethylene glycol (MIRALAX / GLYCOLAX) packet Take 17 g by mouth daily. Patient taking differently: Take 17 g by mouth daily as needed (constipation). Mix with 8 oz orange juice and drink 11/28/17  Yes Samtani, Darylene Price, MD  potassium chloride SA (K-DUR,KLOR-CON) 20 MEQ tablet Take 20 mEq by mouth 2 (two) times daily.   Yes [provider]  Probiotic Product (PROBIOTIC DAILY PO) Take 1 tablet by mouth daily.    Yes [provider]  rosuvastatin (CRESTOR) 10 MG tablet TAKE 1 TABLET BY MOUTH ONCE DAILY Patient taking differently: Take 10 mg by mouth daily.  10/29/17  Yes [name], MD  senna-docusate (SENOKOT-S) 8.6-50 MG tablet Take 2 tablets by mouth 2 (two) times daily. Patient taking differently: Take 1 tablet by mouth See admin instructions. Take one tablet by mouth every morning, may take another tablet in the evening as needed for constipation 11/27/17  Yes Nita Sells, MD  sertraline (ZOLOFT) 25 MG tablet TAKE 1 TABLET BY MOUTH EVERY DAY Patient taking differently: Take 25 mg by mouth daily.  12/30/17  Yes Bensimhon, Shaune Pascal, MD  torsemide (DEMADEX) 20 MG tablet Take 1 tablet (20 mg total) by mouth daily. Can take 20 mg extra as needed. Patient taking differently: Take 20 mg by mouth See admin instructions. Take one tablet (20 mg) by mouth every morning, may also take one tablet in the afternoon as needed for swelling 11/27/17  Yes Shirley Friar, PA-C  traZODone (DESYREL) 50 MG tablet TAKE 1 TABLET(50 MG) BY MOUTH AT BEDTIME Patient taking differently: Take 50 mg by mouth at bedtime.  10/29/17  Yes [name], MD  vitamin B-12 (CYANOCOBALAMIN) 500 MCG tablet Take 500 mcg by mouth every other day.   Yes [provider]  VYNDAQEL 20 MG CAPS Take 80 mg by mouth daily. (4 capsules) Patient taking differently: Take 80 mg by mouth every evening.  11/27/17  Yes Shirley Friar, PA-C  zolpidem (AMBIEN) 5 MG tablet TAKE 1 TABLET BY MOUTH EVERY NIGHT AT BEDTIME AS NEEDED FOR SLEEP Patient taking differently: Take 5 mg by mouth at bedtime.  10/23/17  Yes [name], MD  glucose blood test strip Use as instructed 12/01/17   [name], MD    Past Medical History: Past Medical History:  Diagnosis Date  .  Anemia 11/2016  . Arthritis    "some; hands/fingers" (06/25/2017)  . Atrial tachycardia ([place])   . Barrett's esophagus   . Basal cell carcinoma    "face, arms, hands" (06/25/2017)  . Chicken pox   . Cholelithiasis 11/2016   S/P LAP CHOLE  . Chronic combined systolic and diastolic CHF (congestive heart failure) ([place])   . CKD (chronic kidney disease), stage III ([place])   . Esophageal varices (HCC)    Secondary to NAFLD  . Family history of adverse reaction to anesthesia    "daughter's sat drop very low; last OR had to be reintubated" (06/25/2017)  . GERD (gastroesophageal reflux disease)   . Goals of care, counseling/discussion 10/22/2017  . High cholesterol   . Hypertension   . Kappa light chain myeloma ([place]) 10/22/2017  . Mitral regurgitation   . NAFLD (nonalcoholic fatty liver disease)   . NICM (nonischemic cardiomyopathy) ([place])    Echo 06/2017: Severe LVH.  EF 25 to 30% with mild HK of the baseline after septal wall.  Moderate to severely dilated right atrium, moderately dilated RV. (Concern fr ? amyloidosis).  . NSVT (nonsustained ventricular tachycardia) ([place])   .  Pneumonia 09/2016  . RBBB   . Tricuspid regurgitation   . Type II diabetes mellitus ([place])     Past Surgical History: Past Surgical History:  Procedure Laterality Date  . BACK SURGERY    . BASAL CELL CARCINOMA EXCISION Right    temple; "had to do skin graft"  . CARDIAC CATHETERIZATION  09/2016   Minimal, non-occlusive CAD, EF 45% [facility])  . CARDIOVERSION N/A 11/10/2017   Procedure: CARDIOVERSION;  Surgeon: Jolaine Artist, MD;  Location: [place];  Service: Cardiovascular;  Laterality: N/A;  . CARDIOVERSION N/A 11/14/2017   Procedure: CARDIOVERSION;  Surgeon: Jolaine Artist, MD;  Location: [facility] ENDOSCOPY;  Service: Cardiovascular;  Laterality: N/A;  . CARDIOVERSION N/A 12/19/2017   Procedure: CARDIOVERSION;  Surgeon: Jolaine Artist, MD;  Location: [facility] ENDOSCOPY;  Service: Cardiovascular;  Laterality: N/A;  . CARPAL TUNNEL RELEASE Bilateral   . CATARACT EXTRACTION W/ INTRAOCULAR LENS  IMPLANT, BILATERAL Bilateral   . CHOLECYSTECTOMY N/A 11/22/2016   Procedure: LAPAROSCOPIC CHOLECYSTECTOMY;  Surgeon: Coralie Keens, MD;  Location: [place];  Service: General;  Laterality: N/A;  . IR FLUORO GUIDE CV LINE RIGHT  11/26/2017  . IR US GUIDE VASC ACCESS RIGHT  11/26/2017  . JOINT REPLACEMENT    . LUMBAR DISC SURGERY  X 2  . TEE WITHOUT CARDIOVERSION N/A 11/10/2017   Procedure: TRANSESOPHAGEAL ECHOCARDIOGRAM (TEE);  Surgeon: Jolaine Artist, MD;  Location[facility] ENDOSCOPY;  Service: Cardiovascular;  Laterality: N/A;  . TONSILLECTOMY AND ADENOIDECTOMY    . TOTAL KNEE ARTHROPLASTY Left 2010  . TRANSESOPHAGEAL ECHOCARDIOGRAM  09/2016   Mild global LV hypokinesis. Moderate MR. Moderate TR. [facility])  . TRANSTHORACIC ECHOCARDIOGRAM  10/2016   [facility]) EF 35-40% with moderate HK. Normal RV function. Moderately severe TR.  Marland Kitchen TRANSTHORACIC ECHOCARDIOGRAM  03/2017; 06/2017    A) ([place]) mild concentric LVH. EF of 40-45%. Diffuse HK with no RWMA. GR  2 DD. Septal dyssynergy. Mild MR, moderate TR. Severe RA dilation..;; B) Severe LVH.  EF 25 to 30% with mild HK of Septal wall.  Mod-severely dilated RA & Mod dilated RV.    Family History: Family History  Problem Relation Age of Onset  . Heart failure Mother   . Cancer Father   . Cataracts Father   . Breast  cancer Sister   . Alcohol abuse Brother   . Liver disease Brother   . Autoimmune disease Daughter        Bechet's syndrome  . Cirrhosis Neg Hx     Social History: Social History   Socioeconomic History  . Marital status: Married    Spouse name: Not on file  . Number of children: 4  . Years of education: 57  . Highest education level: Not on file  Occupational History  . Occupation: retired  Scientific laboratory technician  . Financial resource strain: Not on file  . Food insecurity:    Worry: Not on file    Inability: Not on file  . Transportation needs:    Medical: Not on file    Non-medical: Not on file  Tobacco Use  . Smoking status: Former Smoker    Packs/day: 0.30    Years: 15.00    Pack years: 4.50    Types: Cigarettes    Last attempt to quit: 03/17/1977    Years since quitting: 40.8  . Smokeless tobacco: Never Used  Substance and Sexual Activity  . Alcohol use: Yes    Comment: 06/25/2017 "nothing since 09/2016;  used to have glass of wine q hs"  . Drug use: No  . Sexual activity: Not on file  Lifestyle  . Physical activity:    Days per week: Not on file    Minutes per session: Not on file  . Stress: Not on file  Relationships  . Social connections:    Talks on phone: Not on file    Gets together: Not on file    Attends religious service: Not on file    Active member of club or organization: Not on file    Attends meetings of clubs or organizations: Not on file    Relationship status: Not on file  Other Topics Concern  . Not on file  Social History Narrative   He is retired from [employer].  Previously worked for [employer] by his daughter  --> also a patient of Dr. Ellyn Hack   Lives with wife in a one story home.    Education: high school.     Allergies:  No Known Allergies  Objective:    Vital Signs:   Temp:  [98 F (36.7 C)] 98 F (36.7 C) (09/24 1350) Pulse Rate:  [39-41] 41 (09/24 1415) Resp:  [19-24] 24 (09/24 1415) BP: (100-107)/(51-65) 100/53 (09/24 1415) SpO2:  [92 %-96 %] 92 % (09/24 1415)   Weight change: There were no vitals filed for this visit.  Intake/Output:  No intake or output data in the 24 hours ending 01/06/18 1521   Physical Exam    General:  Chronically ill appearing. NAD.  HEENT: normal Neck: supple. JVP 8-9 cm. Carotids 2+ bilat; no bruits. No lymphadenopathy or thyromegaly appreciated. Cor: PMI nondisplaced. Brady, slightly irregular. No rubs, gallops or murmurs. Lungs: clear Abdomen: soft, nontender, nondistended. No hepatosplenomegaly. No bruits or masses. Good bowel sounds. Extremities: no cyanosis, clubbing, or rash.  Neuro: alert & orientedx3, cranial nerves grossly intact. moves all 4 extremities w/o difficulty. Affect pleasant  Telemetry   Junctional rhythm/bradycardia in 40s, personally reviewed.   EKG    Junctional rhythm 41 bpm, personally reviewed.   Labs   Basic Metabolic Panel: Recent Labs  Lab 01/05/18 1431 01/06/18 1427  NA 141 141  K 3.6 4.0  CL 100 98  CO2 29 29  GLUCOSE 181* 141*  BUN 28* 33*  CREATININE 2.04* 2.11*  CALCIUM 9.3 9.3    Liver Function Tests: No results for input(s): AST, ALT, ALKPHOS, BILITOT, PROT, ALBUMIN in the last 168 hours. No results for input(s): LIPASE, AMYLASE in the last 168 hours. No results for input(s): AMMONIA in the last 168 hours.  CBC: Recent Labs  Lab 01/06/18 1427  WBC 8.8  HGB 11.7*  HCT 37.9*  MCV 98.2  PLT 126*    Cardiac Enzymes: No results for input(s): CKTOTAL, CKMB, CKMBINDEX, TROPONINI in the last 168 hours.  BNP: BNP (last 3 results) Recent Labs    11/05/17 1310 12/02/17 1846  01/05/18 1431  BNP 515.3* 349.6* 516.7*    ProBNP (last 3 results) No results for input(s): PROBNP in the last 8760 hours.   CBG: Recent Labs  Lab 01/06/18 1354  GLUCAP 139*    Coagulation Studies: No results for input(s): LABPROT, INR in the last 72 hours.   Imaging   No results found.   Medications:     Current Medications:    Infusions:    Patient Profile   Dillon Andrews is a 77 y.o. malewith a hx of chronic combined CHF due to NICM, CKD III,presumedNASH(per pt, never proven),esophageal varices per chart(prior glass of wine occasionally but no heavy alcohol),HTN, HLD, DM, Barrett's esophagus, GERD, anemia, RBBB  Admitted with syncope and fall in setting of bradycardia.   Assessment/Plan   1. Syncope with fall - Mechanical injury to head with laceration.  - CT head shows no acute abnormality.  - Likely in the setting of #2  2. Junctional rhtyhm with symptomatic bradycardia -He has been in [facility]/p DCCV 7/31, 8/2, 12/19/2017 - Amio on hold since 01/05/18 - Not ideal pacemaker candidate with extensive co-morbidities including cardiac amyloidosis. -Continue Eliquis 2.5 mg BID depending on CT scan.   3. 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 -Echo 06/2017: EF 25-30%, mild LAE, mild/mod RV dilation, mod/severe RA. -PYP scan strongly suggestive of TTR amyloid, SPEP with 0.8% m-spike, UPEP with Bence Jones Protein positive, kappa type. 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 - Dr Marin Olp saw and thinks he does not have AL amyloid (just multiple myeloma) and that he has TTR amyloid and should be treated with tafamidis before chemo. Tafamidis started on 8/2.  -Has been NYHA IIIb.  - Volume status stable to dry. Hold torsemide for now.  - Continue milrinone 0.125 mcg. Managed by [facility] - Continue tafamidis 80 mg daily - No ARB/entresto/spirowith for now with CKD - No BB with  low output.  - SBP soft. Hold off on hydralazine/imdur - Discussed limiting fluid and salt intake.   4. AKI on CKD III, baseline (1.4 - 1.6 11/2017) - Cr 2.0, likely dehydrated in setting of malaise and decreased po intake.   5. [place] with Dr. Marin Olp  -Started on Bortezomib7/18/18andRevlimid7/23/19. - Dr Marin Olp thinks he does not have AL amyloid and that he does have TTR amyloid and should be treated with tafamodis before chemo. Now on Tafamidis.  - Have encouraged follow up with Dr Marin Olp  6. NASH with esophageal varices.  - Last [facility]3/8/2016at [facility] surveillance of Esophageal varices, Barrett's esophagus, and PMHx of Colon polyps. Showed two columns of small esophageal varices w/out stigmata, short segment Barrett's esophagus (without biopsies, due to presence of EVs), 5cm hiatal hernia, and hyperplastic small gastric nodules. - Continue low dose eliquis 2.5 mg BID for afib depending  on CT scan.  - No change at this time.   7. H/o GI bleeds/Anemia - Hgb stable. No overt bleeding.   With multiple co-morbidities including myeloma, NASH, AKI, and h/o anemia, would ask IM to admit. We will follow along. He is not an ideal pacemaker candidate with extensive co-morbidities including cardiac amyloidosis.  Medication concerns reviewed with patient and pharmacy team. Barriers identified: None at this time.   Length of Stay: 0  Annamaria Helling  01/06/2018, 3:21 PM  Advanced Heart Failure Team Pager [phone] (M-F; 7a - 4p)  Please contact [facility] for night-coverage after hours (4p -7a ) and weekends on [email or web address]

## 2018-01-06 NOTE — ED Provider Notes (Signed)
Emergency Department Provider Note   I have reviewed the triage vital signs and the nursing notes.   HISTORY  Chief Complaint Loss of Consciousness   HPI Dillon Andrews is a 77 y.o. male who presents the emergency department today secondary to syncope.  Patient has chronic heart failure secondary to amyloidosis and is not a candidate for pacemaker.  He was found to be bradycardic yesterday at his cardiologist office so discontinued amiodarone.  And the patient was basically astigmatic at that time but today he was talking to his friend and syncopized while sitting there with a prodrome of general nausea and lightheadedness.  This did not happen with movement or try to stand up or any other exacerbating factors.  He did hit his head but did not sustain any lacerations from it he does have a couple areas on his head of open wounds but when pointed out he states that this is not where he hit his head and is not where it hurts.no persistent headache or new neuro deficits since head trauma.  No other associated or modifying symptoms.    Past Medical History:  Diagnosis Date  . Anemia 11/2016  . Arthritis    "some; hands/fingers" (06/25/2017)  . Atrial tachycardia ([place])   . Barrett's esophagus   . Basal cell carcinoma    "face, arms, hands" (06/25/2017)  . Chicken pox   . Cholelithiasis 11/2016   S/P LAP CHOLE  . Chronic combined systolic and diastolic CHF (congestive heart failure) ([place])   . CKD (chronic kidney disease), stage III ([place])   . Esophageal varices (HCC)    Secondary to NAFLD  . Family history of adverse reaction to anesthesia    "daughter's sat drop very low; last OR had to be reintubated" (06/25/2017)  . GERD (gastroesophageal reflux disease)   . Goals of care, counseling/discussion 10/22/2017  . High cholesterol   . Hypertension   . Kappa light chain myeloma ([place]) 10/22/2017  . Mitral regurgitation   . NAFLD (nonalcoholic fatty liver disease)   . NICM (nonischemic  cardiomyopathy) ([place])    Echo 06/2017: Severe LVH.  EF 25 to 30% with mild HK of the baseline after septal wall.  Moderate to severely dilated right atrium, moderately dilated RV. (Concern fr ? amyloidosis).  . NSVT (nonsustained ventricular tachycardia) ([place])   . Pneumonia 09/2016  . RBBB   . Tricuspid regurgitation   . Type II diabetes mellitus [facility])     Patient Active Problem List   Diagnosis Date Noted  . CHB (complete heart block) ([place]) 01/06/2018  . Chronic combined systolic and diastolic CHF (congestive heart failure) ([place]) 12/02/2017  . Paroxysmal atrial flutter ([place]) 12/02/2017  . Abdominal distension   . Hematochezia   . Acute blood loss anemia   . Rectal bleeding   . Cardiogenic shock ([place])   . Palliative care by specialist   . Atrial fibrillation with RVR ([place]) 11/05/2017  . Leukocytosis 11/05/2017  . Elevated troponin 11/05/2017  . Elevated brain natriuretic peptide (BNP) level, acute on chronic 11/05/2017  . Hypotension 11/05/2017  . Chest pain   . Kappa light chain myeloma ([place]) 10/22/2017  . Goals of care, counseling/discussion 10/22/2017  . Weakness generalized 08/18/2017  . Anemia 08/18/2017  . Cardiac amyloidosis ([place]) 07/18/2017  . Hypokalemia 06/25/2017  . Non-ischemic cardiomyopathy ([place]) 05/12/2017  . Mitral regurgitation 05/12/2017  . Other secondary pulmonary hypertension ([place]) 05/12/2017  . RBBB 05/12/2017  . Type II diabetes mellitus ([place])   .  Essential hypertension   . CKD (chronic kidney disease), stage III ([place])   . Hospital discharge follow-up 04/03/2017  . SOB (shortness of breath) 03/05/2017  . Insomnia 03/05/2017  . Stress 03/05/2017  . Restless leg syndrome 03/05/2017  . Cholecystitis 11/22/2016  . HCAP (healthcare-associated pneumonia) 11/19/2016  . CKD (chronic kidney disease) stage 3, GFR 30-59 ml/min (HCC), baseline Cr 1.5 11/19/2016  . DM2 (diabetes mellitus, type 2) ([place]) 11/19/2016  . Acute on chronic systolic (congestive) heart  failure ([place]) 11/19/2016  . Bilateral pleural effusion 11/19/2016  . Epigastric pain 11/19/2016  . Abnormal serum protein electrophoresis 11/19/2016  . Esophageal varices in cirrhosis ([place]) 11/19/2016    Past Surgical History:  Procedure Laterality Date  . BACK SURGERY    . BASAL CELL CARCINOMA EXCISION Right    temple; "had to do skin graft"  . CARDIAC CATHETERIZATION  09/2016   Minimal, non-occlusive CAD, EF 45% [facility])  . CARDIOVERSION N/A 11/10/2017   Procedure: CARDIOVERSION;  Surgeon: Jolaine Artist, MD;  Location: [place];  Service: Cardiovascular;  Laterality: N/A;  . CARDIOVERSION N/A 11/14/2017   Procedure: CARDIOVERSION;  Surgeon: Jolaine Artist, MD;  Location: [facility] ENDOSCOPY;  Service: Cardiovascular;  Laterality: N/A;  . CARDIOVERSION N/A 12/19/2017   Procedure: CARDIOVERSION;  Surgeon: Jolaine Artist, MD;  Location: [facility] ENDOSCOPY;  Service: Cardiovascular;  Laterality: N/A;  . CARPAL TUNNEL RELEASE Bilateral   . CATARACT EXTRACTION W/ INTRAOCULAR LENS  IMPLANT, BILATERAL Bilateral   . CHOLECYSTECTOMY N/A 11/22/2016   Procedure: LAPAROSCOPIC CHOLECYSTECTOMY;  Surgeon: Coralie Keens, MD;  Location: [place];  Service: General;  Laterality: N/A;  . IR FLUORO GUIDE CV LINE RIGHT  11/26/2017  . IR US GUIDE VASC ACCESS RIGHT  11/26/2017  . JOINT REPLACEMENT    . LUMBAR DISC SURGERY  X 2  . TEE WITHOUT CARDIOVERSION N/A 11/10/2017   Procedure: TRANSESOPHAGEAL ECHOCARDIOGRAM (TEE);  Surgeon: Jolaine Artist, MD;  Location[facility] ENDOSCOPY;  Service: Cardiovascular;  Laterality: N/A;  . TONSILLECTOMY AND ADENOIDECTOMY    . TOTAL KNEE ARTHROPLASTY Left 2010  . TRANSESOPHAGEAL ECHOCARDIOGRAM  09/2016   Mild global LV hypokinesis. Moderate MR. Moderate TR. [facility])  . TRANSTHORACIC ECHOCARDIOGRAM  10/2016   [facility]) EF 35-40% with moderate HK. Normal RV function. Moderately severe TR.  Marland Kitchen TRANSTHORACIC ECHOCARDIOGRAM  03/2017;  06/2017    A) ([place]) mild concentric LVH. EF of 40-45%. Diffuse HK with no RWMA. GR 2 DD. Septal dyssynergy. Mild MR, moderate TR. Severe RA dilation..;; B) Severe LVH.  EF 25 to 30% with mild HK of Septal wall.  Mod-severely dilated RA & Mod dilated RV.    Current Outpatient Rx  . Order #: [id number] Class: Normal  . Order #: [id number] Class: Normal  . Order #: [id number] Class: Normal  . Order #: [id number] Class: Normal  . Order #: [id number] Class: Normal  . Order #: [id number] Class: Historical Med  . Order #: [id number] Class: No Print  . Order #: [id number] Class: Normal  . Order #: [id number] Class: Normal  . Order #: [id number] Class: Normal  . Order #: [id number] Class: Historical Med  . Order #: [id number] Class: Historical Med  . Order #: [id number] Class: Normal  . Order #: [id number] Class: Normal  . Order #: [id number] Class: Normal  . Order #: [id number] Class: Normal  . Order #: [id number] Class: Normal  . Order #: [id number] Class: Historical Med  . Order #: [id number] Class: No Print  . Order #: [id number] Class: Normal  . Order #: [id number] Class: Normal    Allergies Patient  has no known allergies.  Family History  Problem Relation Age of Onset  . Heart failure Mother   . Cancer Father   . Cataracts Father   . Breast cancer Sister   . Alcohol abuse Brother   . Liver disease Brother   . Autoimmune disease Daughter        Bechet's syndrome  . Cirrhosis Neg Hx     Social History Social History   Tobacco Use  . Smoking status: Former Smoker    Packs/day: 0.30    Years: 15.00    Pack years: 4.50    Types: Cigarettes    Last attempt to quit: 03/17/1977    Years since quitting: 40.8  . Smokeless tobacco: Never Used  Substance Use Topics  . Alcohol use: Yes    Comment: 06/25/2017 "nothing since 09/2016;  used to have glass of wine q hs"  . Drug use: No    Review of Systems  All other systems negative except as documented in the HPI. All pertinent positives and negatives as  reviewed in the HPI. ____________________________________________   PHYSICAL EXAM:  VITAL SIGNS: ED Triage Vitals  Enc Vitals Group     BP 01/06/18 1350 101/65     Pulse Rate 01/06/18 1350 (!) 41     Resp 01/06/18 1350 19     Temp 01/06/18 1350 98 F (36.7 C)     Temp Source 01/06/18 1350 Oral     SpO2 01/06/18 1350 96 %    Constitutional: Alert and oriented. Well appearing and in no acute distress. Eyes: Conjunctivae are normal. PERRL. EOMI. Head: Atraumatic. Nose: No congestion/rhinnorhea. Mouth/Throat: Mucous membranes are moist.  Oropharynx non-erythematous. Neck: No stridor.  No meningeal signs.   Cardiovascular: bradycardic rate, regular rhythm. Good peripheral circulation. Grossly normal heart sounds.   Respiratory: tachypneic respiratory effort.  No retractions. Lungs CTAB. Gastrointestinal: Soft and nontender. No distention.  Musculoskeletal: No lower extremity tenderness nor edema. No gross deformities of extremities. Neurologic:  Normal speech and language. No gross focal neurologic deficits are appreciated.  Skin:  Skin is warm, dry and intact. No rash noted. CVC anterior right chest without e/o infection.   ____________________________________________   LABS (all labs ordered are listed, but only abnormal results are displayed)  Labs Reviewed  BASIC METABOLIC PANEL - Abnormal; Notable for the following components:      Result Value   Glucose, Bld 141 (*)    BUN 33 (*)    Creatinine, Ser 2.11 (*)    GFR calc non Af Amer 28 (*)    GFR calc Af Amer 33 (*)    All other components within normal limits  CBC - Abnormal; Notable for the following components:   RBC 3.86 (*)    Hemoglobin 11.7 (*)    HCT 37.9 (*)    RDW 24.0 (*)    Platelets 126 (*)    All other components within normal limits  CBG MONITORING, ED - Abnormal; Notable for the following components:   Glucose-Capillary 139 (*)    All other components within normal limits  I-STAT TROPONIN, ED -  Abnormal; Notable for the following components:   Troponin i, poc 0.36 (*)    All other components within normal limits  URINALYSIS, ROUTINE W REFLEX MICROSCOPIC   ____________________________________________  EKG   EKG Interpretation  Date/Time:  Tuesday January 06 2018 13:51:14 EDT Ventricular Rate:  41 PR Interval:    QRS Duration: 184 QT Interval:  648 QTC Calculation: 536 R  Axis:   -94 Text Interpretation:  Complete AV block with wide QRS complex Right bundle branch block complete block similar to  yesterday Confirmed by Merrily Pew [phone]) on 01/06/2018 1:57:30 PM       ____________________________________________  RADIOLOGY  Ct Head Wo Contrast  Result Date: 01/06/2018 CLINICAL DATA:  Syncopal episode falling and hitting head, dizziness and nausea, the patient is on blood thinners EXAM: CT HEAD WITHOUT CONTRAST TECHNIQUE: Contiguous axial images were obtained from the base of the skull through the vertex without intravenous contrast. COMPARISON:  None. FINDINGS: The ventricular system is dilated as are the cortical sulci, consistent with diffuse atrophy. The septum is midline in position. The fourth ventricle and basilar cisterns are unremarkable. Moderate small vessel ischemic changes present throughout the periventricular white matter. No hemorrhage, mass lesion, or acute infarction is seen. Vascular: No vascular abnormality is evident on this unenhanced study. Skull: On bone window images, no calvarial fracture is seen. No scalp hematoma is evident. Sinuses/Orbits: There is mild mucosal thickening in the floor of the right maxillary sinus. Other: None. IMPRESSION: 1. Atrophy and mild to moderate small vessel ischemic change. 2. No acute intracranial abnormality. Electronically Signed   By: Ivar Drape M.D.   On: 01/06/2018 15:22    ____________________________________________   PROCEDURES  Procedure(s) performed:   Procedures  CRITICAL CARE Performed by: Merrily Pew Total critical care time: 35 minutes Critical care time was exclusive of separately billable procedures and treating other patients. Critical care was necessary to treat or prevent imminent or life-threatening deterioration. Critical care was time spent personally by me on the following activities: development of treatment plan with patient and/or surrogate as well as nursing, discussions with consultants, evaluation of patient's response to treatment, examination of patient, obtaining history from patient or surrogate, ordering and performing treatments and interventions, ordering and review of laboratory studies, ordering and review of radiographic studies, pulse oximetry and re-evaluation of patient's condition.  ____________________________________________   INITIAL IMPRESSION / ASSESSMENT AND PLAN / ED COURSE  Patient found to have complete heart block here likely secondary to the significant amyloidosis that he has.  Pacer pads placed however it seems that the situation is more of a electrical conduction problem is not likely to be amenable to any type of intervention.  Either way discussed with his cardiologist, Dr. [name], who recommends hospitalist admission with cardiology consultation.  Recommend stepdown on continuous telemetry.  Will ct head to eval for any intracranial injuries after fall on eliquis.   After consultation, cardiology plans to admit.    Pertinent labs & imaging results that were available during my care of the patient were reviewed by me and considered in my medical decision making (see chart for details).  ____________________________________________  FINAL CLINICAL IMPRESSION(S) / ED DIAGNOSES  Final diagnoses:  Symptomatic bradycardia     MEDICATIONS GIVEN DURING THIS VISIT:  Medications - No data to display   NEW OUTPATIENT MEDICATIONS STARTED DURING THIS VISIT:  New Prescriptions   No medications on file    Note:  This note was  prepared with assistance of Dragon voice recognition software. Occasional wrong-word or sound-a-like substitutions may have occurred due to the inherent limitations of voice recognition software.   Merrily Pew, MD 01/06/18 [phone]

## 2018-01-06 NOTE — Progress Notes (Signed)
Updated MD that patient continues to go in and out of [facility] and asymptomatic at this time. Zoll and Atropine at bedside with  pads on patient for any emergency events. I will continue to monitor.

## 2018-01-06 NOTE — Progress Notes (Signed)
[facility] Infusion Coordinator/Home Care team will follow pt with AHF team to support home milrinone and home care needs at DC.  If patient discharges after hours, please call [phone].   Larry Sierras 01/06/2018, 4:27 PM

## 2018-01-06 NOTE — Care Management Note (Addendum)
Case Management Note  Patient Details  Name: LEMARCUS BAGGERLY MRN: [id number] Date of Birth: [birth year]  Subjective/Objective:                  Syncope  Action/Plan: ED CM spoke with the patient and his daughter, [name], at the bedside. Patient states he uses a Rolator walker at home but is able to walk without his walker. He walks "laps" inside his home using his walker. Patient lives at home with his wife. Patient reports no falls. His daughter lives across the street from his home. Patient states he has a BP cuff, glucometer (boarderline diabetic per daughter) and pulse oximeter. Patient reports he is pleased with the care he receives. He is currently active with [facility] RN visits weekly) for home milrinone management. Heather at [facility] notified of the patient's admission. Unit CM to continue to follow for discharge needs.   Expected Discharge Date:   unknown               Expected Discharge Plan:  [place]  In-House Referral:     Discharge planning Services     Post Acute Care Choice:  Resumption of Svcs/PTA Provider Choice offered to:     DME Arranged:    DME Agency:     HH Arranged:    [place] Agency:     Status of Service:  In process, will continue to follow  If discussed at Long Length of Stay Meetings, dates discussed:    Additional Comments:  Apolonio Schneiders, RN 01/06/2018, 6:12 PM

## 2018-01-06 NOTE — ED Notes (Addendum)
CBG collected. Result "139." RN, [facility], notified.

## 2018-01-06 NOTE — ED Notes (Signed)
Critical I-stat troponin results given to Tawanna Solo, RN and Dolly Rias, MD

## 2018-01-06 NOTE — ED Notes (Signed)
Cards at bedside

## 2018-01-06 NOTE — H&P (Addendum)
Advanced Heart Failure Team H&P   Primary Physician: [name], MD PCP-Cardiologist:  Glenetta Hew, MD  Reason for Admission: Syncope in setting of CHB  HPI:    BRANDY KABAT is seen today for evaluation of syncope at the request of Dr. Dayna Barker.   DEMOSTHENES VIRNIG is a 77 y.o. male  with a hx of chronic combined CHF due to NICM, CKD III, presumed NASH (per pt, never proven), esophageal varices per chart (prior glass of wine occasionally but no heavy alcohol), HTN, HLD, DM, Barrett's esophagus, GERD, anemia, RBBB  Seen in HF clinic 01/05/18 as add on with complaints of fatigue, nausea, weakness, and SOB. At that time, he reported no syncope or presycnope. EKG showed junctional rhythm 43 bpm with RBBB at 178 ms. Amiodarone held, but planned on restarting at decreased dose of 200 mg daily 01/07/18.  Pt presented to [facility] this afternoon due to syncopal episode. He was sitting on the couch when it happened. Fell to the side hitting his head on the end table. No laceration. Pt also has nausea and dizziness that has been ongoing since this am. Tele with junctional rhythm in the 40s.  Labs on admission include K 4.0, Cr 2.1 (Slightly up from baseline was 1.4-1.6 in 11/2017), WBC 8.8, Hgb 11.7. Head CT with atrophy and mild/mod small vessel ischemic changes with no acute intracranial abnormality.   He is feeling better currently. Per daughters, he remains fatigued with very poor appetite. Not eating or drinking much. He is lightheaded with standing. He was sitting on couch as above when he felt very dizzy, and then woke up on the floor with his friend asking him what was wrong. He hit the front of his head. Denies pain. No SOB. He denies orthopnea or PND. No HA, slurred speech, visual changes, or unilateral weakness.   Echo 06/2017: EF 25-30%, mild LAE, mild/mod RV dilation, mod/severe RA.  Review of Systems: [y] = yes, [ ]  = no   . General: Weight gain [ ] ; Weight loss [ ] ; Anorexia  [ ] ; Fatigue [ ] ; Fever [ ] ; Chills [ ] ; Weakness [y]  . Cardiac: Chest pain/pressure [ ] ; Resting SOB [ ] ; Exertional SOB [y]; Orthopnea [ ] ; Pedal Edema [y]; Palpitations [ ] ; Syncope [y]; Presyncope [ ] ; Paroxysmal nocturnal dyspnea[ ]   . Pulmonary: Cough [ ] ; Wheezing[ ] ; Hemoptysis[ ] ; Sputum [ ] ; Snoring [ ]   . GI: Vomiting[ ] ; Dysphagia[ ] ; Melena[ ] ; Hematochezia [ ] ; Heartburn[ ] ; Abdominal pain [ ] ; Constipation [ ] ; Diarrhea [ ] ; BRBPR [ ]   . GU: Hematuria[ ] ; Dysuria [ ] ; Nocturia[ ]   . Vascular: Pain in legs with walking [ ] ; Pain in feet with lying flat [ ] ; Non-healing sores [ ] ; Stroke [ ] ; TIA [ ] ; Slurred speech [ ] ;  . Neuro: Headaches[ ] ; Vertigo[ ] ; Seizures[ ] ; Paresthesias[ ] ;Blurred vision [ ] ; Diplopia [ ] ; Vision changes [ ]   . Ortho/Skin: Arthritis [y]; Joint pain [y]; Muscle pain [ ] ; Joint swelling [ ] ; Back Pain [ ] ; Rash [ ]   . Psych: Depression[ ] ; Anxiety[ ]   . Heme: Bleeding problems [ ] ; Clotting disorders [ ] ; Anemia [ ]   . Endocrine: Diabetes [ ] ; Thyroid dysfunction[ ]   Home Medications Prior to Admission medications   Medication Sig Start Date End Date Taking? Authorizing Provider  amiodarone (PACERONE) 200 MG tablet Take 1 tablet (200 mg total) by mouth daily. 01/05/18  Yes Jervis Trapani, Shaune Pascal, MD  apixaban (ELIQUIS) 2.5 MG TABS tablet Take 1 tablet (2.5 mg total) by mouth 2 (two) times daily. 12/29/17  Yes Uriah Philipson, Shaune Pascal, MD  dronabinol (MARINOL) 5 MG capsule Take 1 capsule (5 mg total) by mouth 2 (two) times daily before lunch and supper. 12/10/17  Yes Volanda Napoleon, MD  gabapentin (NEURONTIN) 800 MG tablet TAKE 1 TABLET BY MOUTH AT BEDTIME Patient taking differently: Take 400 mg by mouth at bedtime.  09/23/17  Yes [name], MD  hydrocortisone (ANUSOL-HC) 25 MG suppository Place 1 suppository (25 mg total) rectally 2 (two) times daily. Patient taking differently: Place 25 mg rectally 2 (two) times daily as needed for hemorrhoids.   11/27/17  Yes Nita Sells, MD  Melatonin 5 MG TABS Take 5 mg by mouth at bedtime.    Yes [provider]  milrinone (PRIMACOR) 20 MG/100 ML SOLN infusion Inject 0.0099 mg/min into the vein continuous. 11/27/17  Yes Shirley Friar, PA-C  omeprazole (PRILOSEC) 40 MG capsule Take 1 capsule (40 mg total) by mouth daily. 07/31/17  Yes [name], MD  ondansetron (ZOFRAN) 4 MG tablet Take 1 tablet (4 mg total) by mouth every 8 (eight) hours as needed for nausea or vomiting. 12/16/17  Yes Ennever, Rudell Cobb, MD  polyethylene glycol (MIRALAX / GLYCOLAX) packet Take 17 g by mouth daily. Patient taking differently: Take 17 g by mouth daily as needed (constipation). Mix with 8 oz orange juice and drink 11/28/17  Yes Samtani, Darylene Price, MD  potassium chloride SA (K-DUR,KLOR-CON) 20 MEQ tablet Take 20 mEq by mouth 2 (two) times daily.   Yes [provider]  Probiotic Product (PROBIOTIC DAILY PO) Take 1 tablet by mouth daily.    Yes [provider]  rosuvastatin (CRESTOR) 10 MG tablet TAKE 1 TABLET BY MOUTH ONCE DAILY Patient taking differently: Take 10 mg by mouth daily.  10/29/17  Yes [name], MD  senna-docusate (SENOKOT-S) 8.6-50 MG tablet Take 2 tablets by mouth 2 (two) times daily. Patient taking differently: Take 1 tablet by mouth See admin instructions. Take one tablet by mouth every morning, may take another tablet in the evening as needed for constipation 11/27/17  Yes Nita Sells, MD  sertraline (ZOLOFT) 25 MG tablet TAKE 1 TABLET BY MOUTH EVERY DAY Patient taking differently: Take 25 mg by mouth daily.  12/30/17  Yes Pape Parson, Shaune Pascal, MD  torsemide (DEMADEX) 20 MG tablet Take 1 tablet (20 mg total) by mouth daily. Can take 20 mg extra as needed. Patient taking differently: Take 20 mg by mouth See admin instructions. Take one tablet (20 mg) by mouth every morning, may also take one tablet in the afternoon as needed for swelling  11/27/17  Yes Shirley Friar, PA-C  traZODone (DESYREL) 50 MG tablet TAKE 1 TABLET(50 MG) BY MOUTH AT BEDTIME Patient taking differently: Take 50 mg by mouth at bedtime.  10/29/17  Yes [name], MD  vitamin B-12 (CYANOCOBALAMIN) 500 MCG tablet Take 500 mcg by mouth every other day.   Yes [provider]  VYNDAQEL 20 MG CAPS Take 80 mg by mouth daily. (4 capsules) Patient taking differently: Take 80 mg by mouth every evening.  11/27/17  Yes Shirley Friar, PA-C  zolpidem (AMBIEN) 5 MG tablet TAKE 1 TABLET BY MOUTH EVERY NIGHT AT BEDTIME AS NEEDED FOR SLEEP Patient taking differently: Take 5 mg by mouth at bedtime.  10/23/17  Yes [name], MD  glucose blood test strip Use as  instructed 12/01/17   [name], MD    Past Medical History: Past Medical History:  Diagnosis Date  . Anemia 11/2016  . Arthritis    "some; hands/fingers" (06/25/2017)  . Atrial tachycardia ([place])   . Barrett's esophagus   . Basal cell carcinoma    "face, arms, hands" (06/25/2017)  . Chicken pox   . Cholelithiasis 11/2016   S/P LAP CHOLE  . Chronic combined systolic and diastolic CHF (congestive heart failure) ([place])   . CKD (chronic kidney disease), stage III ([place])   . Esophageal varices (HCC)    Secondary to NAFLD  . Family history of adverse reaction to anesthesia    "daughter's sat drop very low; last OR had to be reintubated" (06/25/2017)  . GERD (gastroesophageal reflux disease)   . Goals of care, counseling/discussion 10/22/2017  . High cholesterol   . Hypertension   . Kappa light chain myeloma ([place]) 10/22/2017  . Mitral regurgitation   . NAFLD (nonalcoholic fatty liver disease)   . NICM (nonischemic cardiomyopathy) ([place])    Echo 06/2017: Severe LVH.  EF 25 to 30% with mild HK of the baseline after septal wall.  Moderate to severely dilated right atrium, moderately dilated RV. (Concern fr ? amyloidosis).  . NSVT (nonsustained ventricular  tachycardia) ([place])   . Pneumonia 09/2016  . RBBB   . Tricuspid regurgitation   . Type II diabetes mellitus ([place])     Past Surgical History: Past Surgical History:  Procedure Laterality Date  . BACK SURGERY    . BASAL CELL CARCINOMA EXCISION Right    temple; "had to do skin graft"  . CARDIAC CATHETERIZATION  09/2016   Minimal, non-occlusive CAD, EF 45% [facility])  . CARDIOVERSION N/A 11/10/2017   Procedure: CARDIOVERSION;  Surgeon: Jolaine Artist, MD;  Location: [place];  Service: Cardiovascular;  Laterality: N/A;  . CARDIOVERSION N/A 11/14/2017   Procedure: CARDIOVERSION;  Surgeon: Jolaine Artist, MD;  Location: [facility] ENDOSCOPY;  Service: Cardiovascular;  Laterality: N/A;  . CARDIOVERSION N/A 12/19/2017   Procedure: CARDIOVERSION;  Surgeon: Jolaine Artist, MD;  Location: [facility] ENDOSCOPY;  Service: Cardiovascular;  Laterality: N/A;  . CARPAL TUNNEL RELEASE Bilateral   . CATARACT EXTRACTION W/ INTRAOCULAR LENS  IMPLANT, BILATERAL Bilateral   . CHOLECYSTECTOMY N/A 11/22/2016   Procedure: LAPAROSCOPIC CHOLECYSTECTOMY;  Surgeon: Coralie Keens, MD;  Location: [place];  Service: General;  Laterality: N/A;  . IR FLUORO GUIDE CV LINE RIGHT  11/26/2017  . IR US GUIDE VASC ACCESS RIGHT  11/26/2017  . JOINT REPLACEMENT    . LUMBAR DISC SURGERY  X 2  . TEE WITHOUT CARDIOVERSION N/A 11/10/2017   Procedure: TRANSESOPHAGEAL ECHOCARDIOGRAM (TEE);  Surgeon: Jolaine Artist, MD;  Location: [facility] ENDOSCOPY;  Service: Cardiovascular;  Laterality: N/A;  . TONSILLECTOMY AND ADENOIDECTOMY    . TOTAL KNEE ARTHROPLASTY Left 2010  . TRANSESOPHAGEAL ECHOCARDIOGRAM  09/2016   Mild global LV hypokinesis. Moderate MR. Moderate TR. [facility])  . TRANSTHORACIC ECHOCARDIOGRAM  10/2016   [facility]) EF 35-40% with moderate HK. Normal RV function. Moderately severe TR.  Marland Kitchen TRANSTHORACIC ECHOCARDIOGRAM  03/2017; 06/2017    A) ([place]) mild concentric LVH. EF of 40-45%.  Diffuse HK with no RWMA. GR 2 DD. Septal dyssynergy. Mild MR, moderate TR. Severe RA dilation..;; B) Severe LVH.  EF 25 to 30% with mild HK of Septal wall.  Mod-severely dilated RA & Mod dilated RV.    Family History: Family History  Problem Relation  Age of Onset  . Heart failure Mother   . Cancer Father   . Cataracts Father   . Breast cancer Sister   . Alcohol abuse Brother   . Liver disease Brother   . Autoimmune disease Daughter        Bechet's syndrome  . Cirrhosis Neg Hx     Social History: Social History   Socioeconomic History  . Marital status: Married    Spouse name: Not on file  . Number of children: 4  . Years of education: 3  . Highest education level: Not on file  Occupational History  . Occupation: retired  Scientific laboratory technician  . Financial resource strain: Not on file  . Food insecurity:    Worry: Not on file    Inability: Not on file  . Transportation needs:    Medical: Not on file    Non-medical: Not on file  Tobacco Use  . Smoking status: Former Smoker    Packs/day: 0.30    Years: 15.00    Pack years: 4.50    Types: Cigarettes    Last attempt to quit: 03/17/1977    Years since quitting: 40.8  . Smokeless tobacco: Never Used  Substance and Sexual Activity  . Alcohol use: Yes    Comment: 06/25/2017 "nothing since 09/2016;  used to have glass of wine q hs"  . Drug use: No  . Sexual activity: Not on file  Lifestyle  . Physical activity:    Days per week: Not on file    Minutes per session: Not on file  . Stress: Not on file  Relationships  . Social connections:    Talks on phone: Not on file    Gets together: Not on file    Attends religious service: Not on file    Active member of club or organization: Not on file    Attends meetings of clubs or organizations: Not on file    Relationship status: Not on file  Other Topics Concern  . Not on file  Social History Narrative   He is retired from [employer].  Previously worked for [employer] by his daughter --> also a patient of Dr. Ellyn Hack   Lives with wife in a one story home.    Education: high school.     Allergies:  No Known Allergies  Objective:    Vital Signs:   Temp:  [98 F (36.7 C)] 98 F (36.7 C) (09/24 1350) Pulse Rate:  [39-64] 41 (09/24 1600) Resp:  [11-24] 21 (09/24 1600) BP: (100-112)/(51-67) 107/61 (09/24 1600) SpO2:  [92 %-96 %] 94 % (09/24 1600)   Weight change: There were no vitals filed for this visit.  Intake/Output:  No intake or output data in the 24 hours ending 01/06/18 1651   Physical Exam    General:  Chronically ill appearing. NAD.  HEENT: normal Neck: supple. JVP 8-9 cm. Carotids 2+ bilat; no bruits. No lymphadenopathy or thyromegaly appreciated. Cor: PMI nondisplaced. Brady, slightly irregular. No rubs, gallops or murmurs. Lungs: clear Abdomen: soft, nontender, nondistended. No hepatosplenomegaly. No bruits or masses. Good bowel sounds. Extremities: no cyanosis, clubbing, or rash.  Neuro: alert & orientedx3, cranial nerves grossly intact. moves all 4 extremities w/o difficulty. Affect pleasant  Telemetry   Junctional rhythm/bradycardia in 40s, personally reviewed.   EKG    Junctional rhythm 41 bpm, personally reviewed.   Labs   Basic Metabolic Panel: Recent Labs  Lab 01/05/18 1431 01/06/18 1427  NA  141 141  K 3.6 4.0  CL 100 98  CO2 29 29  GLUCOSE 181* 141*  BUN 28* 33*  CREATININE 2.04* 2.11*  CALCIUM 9.3 9.3    Liver Function Tests: No results for input(s): AST, ALT, ALKPHOS, BILITOT, PROT, ALBUMIN in the last 168 hours. No results for input(s): LIPASE, AMYLASE in the last 168 hours. No results for input(s): AMMONIA in the last 168 hours.  CBC: Recent Labs  Lab 01/06/18 1427  WBC 8.8  HGB 11.7*  HCT 37.9*  MCV 98.2  PLT 126*    Cardiac Enzymes: No results for input(s): CKTOTAL, CKMB, CKMBINDEX, TROPONINI in the last 168 hours.  BNP: BNP (last 3 results) Recent Labs     11/05/17 1310 12/02/17 1846 01/05/18 1431  BNP 515.3* 349.6* 516.7*    ProBNP (last 3 results) No results for input(s): PROBNP in the last 8760 hours.   CBG: Recent Labs  Lab 01/06/18 1354  GLUCAP 139*    Coagulation Studies: No results for input(s): LABPROT, INR in the last 72 hours.   Imaging   Ct Head Wo Contrast  Result Date: 01/06/2018 CLINICAL DATA:  Syncopal episode falling and hitting head, dizziness and nausea, the patient is on blood thinners EXAM: CT HEAD WITHOUT CONTRAST TECHNIQUE: Contiguous axial images were obtained from the base of the skull through the vertex without intravenous contrast. COMPARISON:  None. FINDINGS: The ventricular system is dilated as are the cortical sulci, consistent with diffuse atrophy. The septum is midline in position. The fourth ventricle and basilar cisterns are unremarkable. Moderate small vessel ischemic changes present throughout the periventricular white matter. No hemorrhage, mass lesion, or acute infarction is seen. Vascular: No vascular abnormality is evident on this unenhanced study. Skull: On bone window images, no calvarial fracture is seen. No scalp hematoma is evident. Sinuses/Orbits: There is mild mucosal thickening in the floor of the right maxillary sinus. Other: None. IMPRESSION: 1. Atrophy and mild to moderate small vessel ischemic change. 2. No acute intracranial abnormality. Electronically Signed   By: Ivar Drape M.D.   On: 01/06/2018 15:22  .    Medications:     Current Medications:    Infusions:    Patient Profile   KENSTON LONGTON is a 77 y.o. male with a hx of chronic combined CHF due to NICM, CKD III, presumed NASH (per pt, never proven), esophageal varices per chart (prior glass of wine occasionally but no heavy alcohol), HTN, HLD, DM, Barrett's esophagus, GERD, anemia, RBBB  Admitted with syncope and fall in setting of bradycardia.   Assessment/Plan   1. Syncope with fall - Mechanical injury to  head with laceration.  - CT head shows no acute abnormality.  - Likely in the setting of #2  2. Junctional rhtyhm with symptomatic bradycardia -> CHB. - He has been in Atrial Flutter s/p DCCV 7/31, 8/2, 12/19/2017 - Amio on hold since 01/05/18 - Now in CHB. Suspect will need PPM.  - Hold Eliquis in case of procedure.    3. 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  - Echo 06/2017: EF 25-30%, mild LAE, mild/mod RV dilation, mod/severe RA. - PYP scan strongly suggestive of TTR amyloid, SPEP with 0.8% m-spike, UPEP with Bence Jones Protein positive, kappa type. 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  - Dr Marin Olp saw and thinks he does not have AL amyloid (just multiple myeloma) and that he has TTR amyloid and  should be treated with tafamidis before chemo. Tafamidis started on 8/2.  - Has been NYHA IIIb.  - Volume status stable to dry. Hold torsemide for now.  - Continue milrinone 0.125 mcg. Man[facility] by [facility] - Continue tafamidis 80 mg daily - No ARB/entresto/spiro with for now with CKD - No BB with low output.  - SBP soft. Hold off on hydralazine/imdur - Discussed limiting fluid and salt intake.    4. AKI on CKD III, baseline (1.4 - 1.6 11/2017) - Cr 2.0, likely dehydrated in setting of malaise and decreased po intake.     5. [place] with Dr. Marin Olp  - Started on Bortezomib 10/30/16 and Revlimid 11/04/17. - Dr Marin Olp thinks he does not have AL amyloid and that he does have TTR amyloid and should be treated with tafamodis before chemo. Now on Tafamidis.  - Have encouraged follow up with Dr Marin Olp   6. NASH with esophageal varices.  - Last EGD 06/21/2014 at [facility] for surveillance of Esophageal varices, Barrett's esophagus, and PMHx of Colon polyps. Showed two columns of small esophageal varices w/out stigmata, short segment Barrett's esophagus (without biopsies, due to presence of EVs), 5cm hiatal  hernia, and hyperplastic small gastric nodules.  - Hold Eliquis in case of procedure.  - No change at this time.    7. H/o GI bleeds/Anemia - Hgb stable. No overt bleeding.   Will admit for further observation and possible PPM. Will ask EP to see in am.   Medication concerns reviewed with patient and pharmacy team. Barriers identified: None at this time.   Length of Stay: 0  Annamaria Helling  01/06/2018, 4:51 PM  Advanced Heart Failure Team Pager [phone] (M-F; 7a - 4p)  Please contact [facility] for night-coverage after hours (4p -7a ) and weekends on [email or web address]   Patient seen and examined with the above-signed Advanced Practice Provider and/or Housestaff. I personally reviewed laboratory data, imaging studies and relevant notes. I independently examined the patient and formulated the important aspects of the plan. I have edited the note to reflect any of my changes or salient points. I have personally discussed the plan with the patient and/or family.  77 y/o male with cardiac amyloidosis (TTR) complicated by end-stage systolic HF (EF 71%) requiring home milrinone support and PAF/AFL on amiodarone. Presents with syncopal episode in setting of CHB and junctional escape rhythm in the 40s. Head CT without bleed. SBP now stable in low 100s. Has significant underlying conduction system disease with RBBB. Will consult EP to discuss PPM for CHB/tachy-brady but this is complicated by poor short-term prognosis and indwelling PICC line for home inotropes That said, he remains relatively functional and I think PPM is really his only option.   Glori Bickers, MD  7:51 PM

## 2018-01-06 NOTE — ED Triage Notes (Signed)
Pt sitting on couch had syncopal episode. Pt hit his head on end table, cut on top of head. Pt complains of dizziness and nausea. Pt has felt nauseous all morning. Pt's HR 40's. Hx CHF. Pt is on blood thinners.pt is alert and oriented. CBG 136, BP 112/66

## 2018-01-07 ENCOUNTER — Other Ambulatory Visit: Payer: Self-pay | Admitting: Family Medicine

## 2018-01-07 ENCOUNTER — Encounter (HOSPITAL_COMMUNITY): Payer: Medicare HMO

## 2018-01-07 ENCOUNTER — Other Ambulatory Visit: Payer: Self-pay

## 2018-01-07 DIAGNOSIS — G4701 Insomnia due to medical condition: Secondary | ICD-10-CM

## 2018-01-07 DIAGNOSIS — I441 Atrioventricular block, second degree: Secondary | ICD-10-CM

## 2018-01-07 LAB — BASIC METABOLIC PANEL
Anion gap: 13 (ref 5–15)
BUN: 32 mg/dL — AB (ref 8–23)
CALCIUM: 9 mg/dL (ref 8.9–10.3)
CO2: 29 mmol/L (ref 22–32)
CREATININE: 1.98 mg/dL — AB (ref 0.61–1.24)
Chloride: 99 mmol/L (ref 98–111)
GFR calc non Af Amer: 31 mL/min — ABNORMAL LOW (ref >60)
GFR, EST AFRICAN AMERICAN: 35 mL/min — AB (ref >60)
Glucose, Bld: 109 mg/dL — ABNORMAL HIGH (ref 70–99)
Potassium: 3.3 mmol/L — ABNORMAL LOW (ref 3.5–5.1)
SODIUM: 141 mmol/L (ref 135–145)

## 2018-01-07 MED ORDER — POTASSIUM CHLORIDE CRYS ER 20 MEQ PO TBCR
40.0000 meq | EXTENDED_RELEASE_TABLET | Freq: Once | ORAL | Status: AC
  Administered 2018-01-07: 40 meq via ORAL
  Filled 2018-01-07: qty 2

## 2018-01-07 MED ORDER — INFLUENZA VAC SPLIT HIGH-DOSE 0.5 ML IM SUSY
0.5000 mL | PREFILLED_SYRINGE | INTRAMUSCULAR | Status: AC
  Administered 2018-01-09: 0.5 mL via INTRAMUSCULAR
  Filled 2018-01-07: qty 0.5

## 2018-01-07 NOTE — Consult Note (Addendum)
Cardiology Consultation:   Patient ID: SCHAWN BYAS MRN: [id number]; DOB: Apr 26, 1940  Admit date: 01/06/2018 Date of Consult: 01/07/2018  Primary Care Provider: [name], MD Primary Cardiologist: Glenetta Hew, MD  Primary Electrophysiologist:  None    Patient Profile:   SALVATORE SHEAR is a 77 y.o. male with a hx of chronic CHF (NICM, CKD (III), presumed NASH, reported only occasional wine, esophageal varices, Barrett's esophagus, GERD, anemia, HTN, HLD, RBBB who is being seen today for the evaluation of advanced heart block at the request of Dr. [name].  History of Present Illness:   Mr. Montoya was seen in HF clinic 01/05/18 as add on with complaints of fatigue, nausea, weakness, and SOB. At that time, he reported no syncope or presycnope. He was bradycardic 43 bpm with RBBB at 178 ms. Amiodarone was held,with plans to restart at decreased dose of 200 mg daily 01/07/18.  He presented to [facility] ER 01/06/18, yesterday 2/2 syncopal event, noted bradycardic in the 40s. Labs on admission include K 4.0, Cr 2.1 (Slightly up from baseline was 1.4-1.6 in 11/2017), WBC 8.8, Hgb 11.7. Head CT with atrophy and mild/mod small vessel ischemic changes with no acute intracranial abnormality.   The patient reports that yesterday he was at home talking with his friend, was seated, began to feel very weak, lightheaded, and the next thing he know his friend was helping him up  LABS K+ 3.3 BUN/Creat 32/1.98 WBC 8.8 H/H 11/37 plts 126  Past Medical History:  Diagnosis Date  . Anemia 11/2016  . Arthritis    "some; hands/fingers" (06/25/2017)  . Atrial tachycardia ([place])   . Barrett's esophagus   . Basal cell carcinoma    "face, arms, hands" (06/25/2017)  . Chicken pox   . Cholelithiasis 11/2016   S/P LAP CHOLE  . Chronic combined systolic and diastolic CHF (congestive heart failure) ([place])   . CKD (chronic kidney disease), stage III ([place])   . Esophageal varices (HCC)    Secondary  to NAFLD  . Family history of adverse reaction to anesthesia    "daughter's sat drop very low; last OR had to be reintubated" (06/25/2017)  . GERD (gastroesophageal reflux disease)   . Goals of care, counseling/discussion 10/22/2017  . High cholesterol   . Hypertension   . Kappa light chain myeloma ([place]) 10/22/2017  . Mitral regurgitation   . NAFLD (nonalcoholic fatty liver disease)   . NICM (nonischemic cardiomyopathy) ([place])    Echo 06/2017: Severe LVH.  EF 25 to 30% with mild HK of the baseline after septal wall.  Moderate to severely dilated right atrium, moderately dilated RV. (Concern fr ? amyloidosis).  . NSVT (nonsustained ventricular tachycardia) ([place])   . Pneumonia 09/2016  . RBBB   . Tricuspid regurgitation   . Type II diabetes mellitus ([place])     Past Surgical History:  Procedure Laterality Date  . BACK SURGERY    . BASAL CELL CARCINOMA EXCISION Right    temple; "had to do skin graft"  . CARDIAC CATHETERIZATION  09/2016   Minimal, non-occlusive CAD, EF 45% [facility])  . CARDIOVERSION N/A 11/10/2017   Procedure: CARDIOVERSION;  Surgeon: Jolaine Artist, MD;  Location: [facility] ENDOSCOPY;  Service: Cardiovascular;  Laterality: N/A;  . CARDIOVERSION N/A 11/14/2017   Procedure: CARDIOVERSION;  Surgeon: Jolaine Artist, MD;  Location: [facility] ENDOSCOPY;  Service: Cardiovascular;  Laterality: N/A;  . CARDIOVERSION N/A 12/19/2017   Procedure: CARDIOVERSION;  Surgeon: Jolaine Artist, MD;  Location: [facility] ENDOSCOPY;  Service: Cardiovascular;  Laterality: N/A;  . CARPAL TUNNEL RELEASE Bilateral   . CATARACT EXTRACTION W/ INTRAOCULAR LENS  IMPLANT, BILATERAL Bilateral   . CHOLECYSTECTOMY N/A 11/22/2016   Procedure: LAPAROSCOPIC CHOLECYSTECTOMY;  Surgeon: Coralie Keens, MD;  Location: [place] [facility];  Service: General;  Laterality: N/A;  . IR FLUORO GUIDE CV LINE RIGHT  11/26/2017  . IR US GUIDE VASC ACCESS RIGHT  11/26/2017  . JOINT REPLACEMENT    . LUMBAR DISC SURGERY  X 2  . TEE  WITHOUT CARDIOVERSION N/A 11/10/2017   Procedure: TRANSESOPHAGEAL ECHOCARDIOGRAM (TEE);  Surgeon: Jolaine Artist, MD;  Location: [facility] ENDOSCOPY;  Service: Cardiovascular;  Laterality: N/A;  . TONSILLECTOMY AND ADENOIDECTOMY    . TOTAL KNEE ARTHROPLASTY Left 2010  . TRANSESOPHAGEAL ECHOCARDIOGRAM  09/2016   Mild global LV hypokinesis. Moderate MR. Moderate TR. [facility])  . TRANSTHORACIC ECHOCARDIOGRAM  10/2016   [facility]) EF 35-40% with moderate HK. Normal RV function. Moderately severe TR.  Marland Kitchen TRANSTHORACIC ECHOCARDIOGRAM  03/2017; 06/2017    A) ([place]) mild concentric LVH. EF of 40-45%. Diffuse HK with no RWMA. GR 2 DD. Septal dyssynergy. Mild MR, moderate TR. Severe RA dilation..;; B) Severe LVH.  EF 25 to 30% with mild HK of Septal wall.  Mod-severely dilated RA & Mod dilated RV.     Home Medications:  Prior to Admission medications   Medication Sig Start Date End Date Taking? Authorizing Provider  amiodarone (PACERONE) 200 MG tablet Take 1 tablet (200 mg total) by mouth daily. 01/05/18  Yes Bensimhon, Shaune Pascal, MD  apixaban (ELIQUIS) 2.5 MG TABS tablet Take 1 tablet (2.5 mg total) by mouth 2 (two) times daily. 12/29/17  Yes Bensimhon, Shaune Pascal, MD  dronabinol (MARINOL) 5 MG capsule Take 1 capsule (5 mg total) by mouth 2 (two) times daily before lunch and supper. 12/10/17  Yes Volanda Napoleon, MD  gabapentin (NEURONTIN) 800 MG tablet TAKE 1 TABLET BY MOUTH AT BEDTIME Patient taking differently: Take 400 mg by mouth at bedtime.  09/23/17  Yes [name], MD  hydrocortisone (ANUSOL-HC) 25 MG suppository Place 1 suppository (25 mg total) rectally 2 (two) times daily. Patient taking differently: Place 25 mg rectally 2 (two) times daily as needed for hemorrhoids.  11/27/17  Yes Nita Sells, MD  Melatonin 5 MG TABS Take 5 mg by mouth at bedtime.    Yes [provider]  milrinone (PRIMACOR) 20 MG/100 ML SOLN infusion Inject 0.0099 mg/min into  the vein continuous. 11/27/17  Yes Shirley Friar, PA-C  omeprazole (PRILOSEC) 40 MG capsule Take 1 capsule (40 mg total) by mouth daily. 07/31/17  Yes [name], MD  ondansetron (ZOFRAN) 4 MG tablet Take 1 tablet (4 mg total) by mouth every 8 (eight) hours as needed for nausea or vomiting. 12/16/17  Yes Ennever, Rudell Cobb, MD  polyethylene glycol (MIRALAX / GLYCOLAX) packet Take 17 g by mouth daily. Patient taking differently: Take 17 g by mouth daily as needed (constipation). Mix with 8 oz orange juice and drink 11/28/17  Yes Samtani, Darylene Price, MD  potassium chloride SA (K-DUR,KLOR-CON) 20 MEQ tablet Take 20 mEq by mouth 2 (two) times daily.   Yes [provider]  Probiotic Product (PROBIOTIC DAILY PO) Take 1 tablet by mouth daily.    Yes [provider]  rosuvastatin (CRESTOR) 10 MG tablet TAKE 1 TABLET BY MOUTH ONCE DAILY Patient taking differently: Take 10 mg by mouth daily.  10/29/17  Yes [name],  MD  senna-docusate (SENOKOT-S) 8.6-50 MG tablet Take 2 tablets by mouth 2 (two) times daily. Patient taking differently: Take 1 tablet by mouth See admin instructions. Take one tablet by mouth every morning, may take another tablet in the evening as needed for constipation 11/27/17  Yes Nita Sells, MD  sertraline (ZOLOFT) 25 MG tablet TAKE 1 TABLET BY MOUTH EVERY DAY Patient taking differently: Take 25 mg by mouth daily.  12/30/17  Yes Bensimhon, Shaune Pascal, MD  torsemide (DEMADEX) 20 MG tablet Take 1 tablet (20 mg total) by mouth daily. Can take 20 mg extra as needed. Patient taking differently: Take 20 mg by mouth See admin instructions. Take one tablet (20 mg) by mouth every morning, may also take one tablet in the afternoon as needed for swelling 11/27/17  Yes Shirley Friar, PA-C  traZODone (DESYREL) 50 MG tablet TAKE 1 TABLET(50 MG) BY MOUTH AT BEDTIME Patient taking differently: Take 50 mg by mouth at bedtime.  10/29/17  Yes [name], MD  vitamin B-12 (CYANOCOBALAMIN) 500 MCG tablet Take 500 mcg by mouth every other day.   Yes [provider]  VYNDAQEL 20 MG CAPS Take 80 mg by mouth daily. (4 capsules) Patient taking differently: Take 80 mg by mouth every evening.  11/27/17  Yes Shirley Friar, PA-C  glucose blood test strip Use as instructed 12/01/17   [name], MD  zolpidem (AMBIEN) 5 MG tablet Take 1 tablet (5 mg total) by mouth at bedtime. 01/07/18   [name], MD    Inpatient Medications: Scheduled Meds: . acidophilus   Oral Daily  . dronabinol  5 mg Oral BID AC  . gabapentin  400 mg Oral QHS  . Melatonin  3 mg Oral QHS  . pantoprazole  80 mg Oral Daily  . polyethylene glycol  17 g Oral Daily  . rosuvastatin  10 mg Oral Daily  . senna-docusate  1 tablet Oral q morning - 10a  . sertraline  25 mg Oral Daily  . sodium chloride flush  3 mL Intravenous Q12H  . Tafamidis Meglumine (Cardiac)  80 mg Oral QPM  . traZODone  50 mg Oral QHS  . vitamin B-12  500 mcg Oral QODAY   Continuous Infusions: . sodium chloride    . milrinone 0.125 mcg/kg/min (01/07/18 0900)   PRN Meds: sodium chloride, acetaminophen, ondansetron (ZOFRAN) IV, ondansetron, senna-docusate, sodium chloride flush, zolpidem  Allergies:   No Known Allergies  Social History:   Social History   Socioeconomic History  . Marital status: Married    Spouse name: Not on file  . Number of children: 4  . Years of education: 26  . Highest education level: Not on file  Occupational History  . Occupation: retired  Scientific laboratory technician  . Financial resource strain: Not on file  . Food insecurity:    Worry: Not on file    Inability: Not on file  . Transportation needs:    Medical: Not on file    Non-medical: Not on file  Tobacco Use  . Smoking status: Former Smoker    Packs/day: 0.30    Years: 15.00    Pack years: 4.50    Types: Cigarettes    Last attempt to quit: 03/17/1977    Years since  quitting: 40.8  . Smokeless tobacco: Never Used  Substance and Sexual Activity  . Alcohol use: Yes    Comment: 06/25/2017 "nothing since 09/2016;  used to have glass of wine q hs"  .  Drug use: No  . Sexual activity: Not on file  Lifestyle  . Physical activity:    Days per week: Not on file    Minutes per session: Not on file  . Stress: Not on file  Relationships  . Social connections:    Talks on phone: Not on file    Gets together: Not on file    Attends religious service: Not on file    Active member of club or organization: Not on file    Attends meetings of clubs or organizations: Not on file    Relationship status: Not on file  . Intimate partner violence:    Fear of current or ex partner: Not on file    Emotionally abused: Not on file    Physically abused: Not on file    Forced sexual activity: Not on file  Other Topics Concern  . Not on file  Social History Narrative   He is retired from [employer].  Previously worked for [employer] by his daughter --> also a patient of Dr. Ellyn Hack   Lives with wife in a one story home.    Education: high school.     Family History:   Family History  Problem Relation Age of Onset  . Heart failure Mother   . Cancer Father   . Cataracts Father   . Breast cancer Sister   . Alcohol abuse Brother   . Liver disease Brother   . Autoimmune disease Daughter        Bechet's syndrome  . Cirrhosis Neg Hx      ROS:  Please see the history of present illness.  All other ROS reviewed and negative.     Physical Exam/Data:   Vitals:   01/07/18 0600 01/07/18 0741 01/07/18 0800 01/07/18 1149  BP: 117/75 111/67 112/68 110/69  Pulse: 66 60 60 (!) 59  Resp: _0 (!) 25  Temp:  98 F (36.7 C)  98 F (36.7 C)  TempSrc:  Oral  Oral  SpO2: 95% 95% 95% 96%  Weight:      Height:        Intake/Output Summary (Last 24 hours) at 01/07/2018 1219 Last data filed at 01/07/2018 0900 Gross per 24 hour  Intake 34.79 ml    Output 300 ml  Net -265.21 ml   Filed Weights   01/06/18 1850  Weight: 80.6 kg   Body mass index is 25.5 kg/m.  General:  Well nourished, well developed, in no acute distress HEENT: normal Lymph: no adenopathy Neck: no JVD Endocrine:  No thryomegaly Vascular: No carotid bruits Cardiac:  normal S1, S2; RRR; no murmur, gallops or rubs are appreciated Lungs: CTA b/l, no wheezing, rhonchi or rales  Abd: soft, nontender  Ext: no edema Musculoskeletal:  No deformities, advanced atrophy Skin: warm and dry  Neuro:   No gross focal abnormalities noted Psych:  Normal affect   EKG:  The EKG was personally reviewed and demonstrates:   01/05/18 is CHB, RBBB, 43bpm 01/06/18 CHB RBBB, 41bpm Telemetry:  Telemetry was personally reviewed and demonstrates:   CHB >>> regained SR w/1;1 conduction this AM  Relevant CV Studies:  11/10/17: TEE/DCCV FINDINGS: LEFT VENTRICLE: EF = 10% global HK with smoke  RIGHT VENTRICLE: Severely reduced.   LEFT ATRIUM: Moderately dilated 4.8 cm  LEFT ATRIAL APPENDAGE: No clot  RIGHT ATRIUM: Dilated. +PICC  AORTIC VALVE:  Trileaflet. No AI/AS  MITRAL VALVE:   Normal. Mild MR  TRICUSPID VALVE:  Normal Moderate TR  PULMONIC VALVE: Trivial PR  INTERATRIAL SEPTUM: No PFO  PERICARDIUM: No effusion  DESCENDING AORTA: Mild plaque  CARDIOVERSION:    Indications:  Atrial Flutter Procedure Details: Once the TEE was complete, the patient had the defibrillator pads placed in the anterior and posterior position. Once an appropriate level of sedation was achieved, the patient received a single biphasic, synchronized 150J shock with prompt conversion to sinus rhythm. No apparent complications.   06/26/17: TTE Study Conclusions - Left ventricle: The cavity size was normal. Wall thickness was   increased in a pattern of severe LVH. Systolic function was   severely reduced. The estimated ejection fraction was in the   range of 25% to 30%. Mild  hypokinesis of the basalanteroseptal   myocardium. - Aortic valve: Mildly calcified annulus. Trileaflet; normal   thickness, mildly calcified leaflets. - Left atrium: The atrium was mildly dilated. - Right ventricle: The cavity size was mildly to moderately   dilated. Wall thickness was normal. - Right atrium: The atrium was moderately to severely dilated.  NICM by cath at [facility] 09/2016 with EF 40-45%.  Laboratory Data:  Chemistry Recent Labs  Lab 01/05/18 1431 01/06/18 1427 01/07/18 0548  NA 141 141 141  K 3.6 4.0 3.3*  CL 100 98 99  CO2 _0 GLUCOSE 181* 141* 109*  BUN 28* 33* 32*  CREATININE 2.04* 2.11* 1.98*  CALCIUM 9.3 9.3 9.0  GFRNONAA 30* 28* 31*  GFRAA 34* 33* 35*  ANIONGAP _1 No results for input(s): PROT, ALBUMIN, AST, ALT, ALKPHOS, BILITOT in the last 168 hours. Hematology Recent Labs  Lab 01/06/18 1427  WBC 8.8  RBC 3.86*  HGB 11.7*  HCT 37.9*  MCV 98.2  MCH 30.3  MCHC 30.9  RDW 24.0*  PLT 126*   Cardiac EnzymesNo results for input(s): TROPONINI in the last 168 hours.  Recent Labs  Lab 01/06/18 1512  TROPIPOC 0.36*    BNP Recent Labs  Lab 01/05/18 1431  BNP 516.7*    DDimer No results for input(s): DDIMER in the last 168 hours.  Radiology/Studies:   Ct Head Wo Contrast Result Date: 01/06/2018 CLINICAL DATA:  Syncopal episode falling and hitting head, dizziness and nausea, the patient is on blood thinners EXAM: CT HEAD WITHOUT CONTRAST TECHNIQUE: Contiguous axial images were obtained from the base of the skull through the vertex without intravenous contrast. COMPARISON:  None. FINDINGS: The ventricular system is dilated as are the cortical sulci, consistent with diffuse atrophy. The septum is midline in position. The fourth ventricle and basilar cisterns are unremarkable. Moderate small vessel ischemic changes present throughout the periventricular white matter. No hemorrhage, mass lesion, or acute infarction is seen. Vascular: No  vascular abnormality is evident on this unenhanced study. Skull: On bone window images, no calvarial fracture is seen. No scalp hematoma is evident. Sinuses/Orbits: There is mild mucosal thickening in the floor of the right maxillary sinus. Other: None. IMPRESSION: 1. Atrophy and mild to moderate small vessel ischemic change. 2. No acute intracranial abnormality. Electronically Signed   By: Ivar Drape M.D.   On: 01/06/2018 15:22    Assessment and Plan:   1. Syncope 2. Advanced heart block on admission, CHB     His amiodarone held the day prior in the office (on chronically 229m BID)     About 0600 this am he regained 1:1 conduction, appears to have 2 different atrial foci       2.  PAFlutter, appears an atypical flutter     CHA2DS2Vasc is 4, on Eliquis, (low dose) (?)     Hx hx of rectal bleed requiring PRBC July 2019, Eliquis initially held, resumed at low dose     11/10/17 TEE noted LVEF 10% with smoke in LV, no thrombus, no LAA thrombus, cardiverted     If able, I would revisit full dose eliquis  3. Chronic CHF  4. NICM In review of AHF notes: PYP scan strongly suggestive of TTR amyloid, SPEP with 0.8% m-spike, UPEP with Bence Jones Protein positive, kappa type. 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 - Dr Marin Olp saw and thinks he does not have AL amyloid (just multiple myeloma) and that he has TTR amyloid and should be treated with tafamidis before chemo. Tafamidis started on 8/2.  He is on Home milrinone, diuretic, no BB with low output failure, no ACE/ARB with CKD, off hydralazine/imdur 2/2 soft BPs  In review of Dr. Clayborne Dana note, not felt an ICD candidate given poor prognosis, he has regained conduction this AM.  Amiodarone not out of his system.  I will review case with Dr. Caryl Comes, he has increased infection risk, though sounds like he tolerates AFlutter poorly despite rate control.    No pacer planned for today Dr. Caryl Comes will see later today   For questions or  updates, please contact [facility] Please consult [email or web address] for contact info under     Signed, Baldwin Jamaica, PA-C  01/07/2018 12:19 PM     Atrial flutter-atypical  IVCD  [place] 1 heart block with 2: 1 block  Syncope  Nonischemic cardiomyopathy  Milrinone dependent (indwelling PICC line)  Amyloid  Patient seen yesterday.  Tracings reviewed.  Options discussed with patient and his daughter.  The options are not great or straightforward.  Pacing should be undertaken if his bradycardia persists, although risks of infection would be considerably increased by his indwelling PICC line.  The fact that his conduction was normal yesterday morning (i.e. Wednesday morning) makes the same one that conduction may normalize given the association of syncope and bradycardia it is a likely explanation.  However, in the context of amyloid his ejection fraction of 10% he certainly could also had ventricular tachycardia.  In this regard, the value of a defibrillator is unknown but not likely to change his prognosis.  Hence, would recommend that we monitor him for a couple of days and then make a decision regarding proceeding with pacing.  I will reach out with Dr. Reine Just

## 2018-01-07 NOTE — Progress Notes (Signed)
Advanced Heart Failure Rounding Note   Subjective:    Back in NSR in the 60s today. (converted from [place] around [place]). Feels much better. Denies lightheadedness. Appetite better. No SOB/     Objective:   Weight Range:  Vital Signs:   Temp:  [97.9 F (36.6 C)-98.2 F (36.8 C)] 98 F (36.7 C) (09/25 0741) Pulse Rate:  [39-66] 60 (09/25 0741) Resp:  [11-26] 18 (09/25 0741) BP: (92-117)/(51-75) 111/67 (09/25 0741) SpO2:  [92 %-96 %] 95 % (09/25 0741) Weight:  [80.6 kg] 80.6 kg (09/24 1850)    Weight change: Filed Weights   01/06/18 1850  Weight: 80.6 kg    Intake/Output:   Intake/Output Summary (Last 24 hours) at 01/07/2018 0839 Last data filed at 01/07/2018 0000 Gross per 24 hour  Intake 8.15 ml  Output 300 ml  Net -291.85 ml     Physical Exam: General:  Well appearing. No resp difficulty HEENT: normal Neck: supple. JVP . Carotids 2+ bilat; no bruits. No lymphadenopathy or thryomegaly appreciated. Cor: PMI nondisplaced. RRR Lungs: clear Abdomen: soft, nontender, nondistended. No hepatosplenomegaly. No bruits or masses. Good bowel sounds. Extremities: no cyanosis, clubbing, rash, edema Neuro: alert & orientedx3, cranial nerves grossly intact. moves all 4 extremities w/o difficulty. Affect pleasant  Telemetry: NSR 60  Converted from CHB at [place] reviewed   Labs: Basic Metabolic Panel: Recent Labs  Lab 01/05/18 1431 01/06/18 1427 01/07/18 0548  NA 141 141 141  K 3.6 4.0 3.3*  CL 100 98 99  CO2 29 29 29   GLUCOSE 181* 141* 109*  BUN 28* 33* 32*  CREATININE 2.04* 2.11* 1.98*  CALCIUM 9.3 9.3 9.0    Liver Function Tests: No results for input(s): AST, ALT, ALKPHOS, BILITOT, PROT, ALBUMIN in the last 168 hours. No results for input(s): LIPASE, AMYLASE in the last 168 hours. No results for input(s): AMMONIA in the last 168 hours.  CBC: Recent Labs  Lab 01/06/18 1427  WBC 8.8  HGB 11.7*  HCT 37.9*  MCV 98.2  PLT 126*    Cardiac  Enzymes: No results for input(s): CKTOTAL, CKMB, CKMBINDEX, TROPONINI in the last 168 hours.  BNP: BNP (last 3 results) Recent Labs    11/05/17 1310 12/02/17 1846 01/05/18 1431  BNP 515.3* 349.6* 516.7*    ProBNP (last 3 results) No results for input(s): PROBNP in the last 8760 hours.    Other results:  Imaging: Ct Head Wo Contrast  Result Date: 01/06/2018 CLINICAL DATA:  Syncopal episode falling and hitting head, dizziness and nausea, the patient is on blood thinners EXAM: CT HEAD WITHOUT CONTRAST TECHNIQUE: Contiguous axial images were obtained from the base of the skull through the vertex without intravenous contrast. COMPARISON:  None. FINDINGS: The ventricular system is dilated as are the cortical sulci, consistent with diffuse atrophy. The septum is midline in position. The fourth ventricle and basilar cisterns are unremarkable. Moderate small vessel ischemic changes present throughout the periventricular white matter. No hemorrhage, mass lesion, or acute infarction is seen. Vascular: No vascular abnormality is evident on this unenhanced study. Skull: On bone window images, no calvarial fracture is seen. No scalp hematoma is evident. Sinuses/Orbits: There is mild mucosal thickening in the floor of the right maxillary sinus. Other: None. IMPRESSION: 1. Atrophy and mild to moderate small vessel ischemic change. 2. No acute intracranial abnormality. Electronically Signed   By: Ivar Drape M.D.   On: 01/06/2018 15:22      Medications:     Scheduled  Medications: . acidophilus   Oral Daily  . atropine      . dronabinol  5 mg Oral BID AC  . gabapentin  400 mg Oral QHS  . Melatonin  3 mg Oral QHS  . pantoprazole  80 mg Oral Daily  . polyethylene glycol  17 g Oral Daily  . rosuvastatin  10 mg Oral Daily  . senna-docusate  1 tablet Oral q morning - 10a  . sertraline  25 mg Oral Daily  . sodium chloride flush  3 mL Intravenous Q12H  . Tafamidis Meglumine (Cardiac)  80 mg Oral  QPM  . traZODone  50 mg Oral QHS  . vitamin B-12  500 mcg Oral QODAY     Infusions: . sodium chloride    . milrinone 0.125 mcg/kg/min (01/06/18 2109)     PRN Medications:  sodium chloride, acetaminophen, ondansetron (ZOFRAN) IV, ondansetron, senna-docusate, sodium chloride flush, zolpidem   Assessment:   Dillon Schnepf Knightis a 77 y.o.malewith a hx of chronic combined CHF due to NICM, CKD III,presumedNASH(per pt, never proven),esophageal varices per chart(prior glass of wine occasionally but no heavy alcohol),HTN, HLD, DM, Barrett's esophagus, GERD, anemia, RBBB  Admitted with syncope and fall in setting of bradycardia.   Plan/Discussion:     1. Syncope with fall in setting of #2 - Mechanical injury to head with laceration.  - CT head shows no acute abnormality.  - Likely in the setting of #2  2. Junctional rhtyhm with symptomatic bradycardia -> CHB. -He has been in AtrialFlutters/p DCCV 7/31, 8/2, 12/19/2017 - Amio on hold since 01/05/18 - Resolved this am.  - Eliquis on hold in case of procedure. - EP consulted to discuss PPM for CHB/tachy-brady but this is complicated by poor short-term prognosis and indwelling PICC line for home inotropes.He does not tolerate AF well. Will discuss possibiluty of trying amio 100 bid as a compromise and trying to avoid PPM. Though with his degree of conduction system disease - he may still need PPM. Will follow on tele for 24 hours.    3. 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 -Echo 06/2017: EF 25-30%, mild LAE, mild/mod RV dilation, mod/severe RA. -PYP scan strongly suggestive of TTR amyloid, SPEP with 0.8% m-spike, UPEP with Bence Jones Protein positive, kappa type. 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 - Dr Marin Olp saw and thinks he does not have AL amyloid (just multiple myeloma) and that he has TTR amyloid and should be treated with tafamidis before  chemo. Tafamidis started on 8/2.  -Chronic NYHA III-IIIB - Volume status stable to dry. Torsemide on hold. Can restart tomorrow - Continue milrinone 0.125 mcg. Managed by [facility] - Continue tafamidis 80 mg daily - No ARB/entresto/spirowith for now with CKD - No BB with low output/CHB   4. AKI on CKD III, baseline (1.4 - 1.6 11/2017) - Cr 2.0, likely dehydrated in setting of malaise and decreased po intake.  - Improved slightly today. Continue to hold diuretics  5. Kappa Lightchain Myeloma - Follows with Dr. Marin Olp  -Started on Bortezomib7/18/18andRevlimid7/23/19. - Dr Marin Olp thinks he does not have AL amyloid and that he does have TTR amyloid and should be treated with tafamodis before chemo. Now on Tafamidis.  - Have encouraged follow up with Dr Marin Olp  6. NASH with esophageal varices.  - [facility]3/8/2016at [facility] surveillance of Esophageal varices, Barrett's esophagus, and PMHx of Colon polyps. Showed two columns of small esophageal  varices w/out stigmata, short segment Barrett's esophagus (without biopsies, due to presence of EVs), 5cm hiatal hernia, and hyperplastic small gastric nodules. - Hold Eliquis in case of procedure.  - No change at this time.   7. H/o GI bleeds/Anemia - Hgb stable. No overt bleeding.   8. Hypokalemia - Will supp  Length of Stay: 1   Glori Bickers MD 01/07/2018, 8:39 AM  Advanced Heart Failure Team Pager [phone] (M-F; [place])  Please contact [facility] for night-coverage after hours (4p -7a ) and weekends on [email or web address]

## 2018-01-08 ENCOUNTER — Other Ambulatory Visit (HOSPITAL_COMMUNITY): Payer: Self-pay | Admitting: Internal Medicine

## 2018-01-08 ENCOUNTER — Inpatient Hospital Stay (HOSPITAL_COMMUNITY)
Admission: EM | Disposition: A | Discharge status: Home Health | Payer: Self-pay | Source: Home / Self Care | Attending: Internal Medicine

## 2018-01-08 DIAGNOSIS — Z950 Presence of cardiac pacemaker: Secondary | ICD-10-CM

## 2018-01-08 DIAGNOSIS — I5043 Acute on chronic combined systolic (congestive) and diastolic (congestive) heart failure: Secondary | ICD-10-CM | POA: Diagnosis not present

## 2018-01-08 DIAGNOSIS — N183 Chronic kidney disease, stage 3 (moderate): Secondary | ICD-10-CM | POA: Diagnosis not present

## 2018-01-08 DIAGNOSIS — E119 Type 2 diabetes mellitus without complications: Secondary | ICD-10-CM | POA: Diagnosis not present

## 2018-01-08 HISTORY — PX: PACEMAKER IMPLANT: EP1218

## 2018-01-08 HISTORY — DX: Presence of cardiac pacemaker: Z95.0

## 2018-01-08 LAB — BASIC METABOLIC PANEL
ANION GAP: 6 (ref 5–15)
BUN: 33 mg/dL — ABNORMAL HIGH (ref 8–23)
CO2: 31 mmol/L (ref 22–32)
Calcium: 9 mg/dL (ref 8.9–10.3)
Chloride: 104 mmol/L (ref 98–111)
Creatinine, Ser: 1.89 mg/dL — ABNORMAL HIGH (ref 0.61–1.24)
GFR calc Af Amer: 38 mL/min — ABNORMAL LOW (ref >60)
GFR, EST NON AFRICAN AMERICAN: 32 mL/min — AB (ref >60)
GLUCOSE: 127 mg/dL — AB (ref 70–99)
Potassium: 3.9 mmol/L (ref 3.5–5.1)
SODIUM: 141 mmol/L (ref 135–145)

## 2018-01-08 SURGERY — PACEMAKER IMPLANT

## 2018-01-08 MED ORDER — HEPARIN (PORCINE) IN NACL 1000-0.9 UT/500ML-% IV SOLN
INTRAVENOUS | Status: AC
  Filled 2018-01-08: qty 500

## 2018-01-08 MED ORDER — CEFAZOLIN SODIUM-DEXTROSE 2-4 GM/100ML-% IV SOLN
2.0000 g | INTRAVENOUS | Status: AC
  Administered 2018-01-08: 2 g via INTRAVENOUS

## 2018-01-08 MED ORDER — MIDAZOLAM HCL 5 MG/5ML IJ SOLN
INTRAMUSCULAR | Status: AC
  Filled 2018-01-08: qty 5

## 2018-01-08 MED ORDER — FENTANYL CITRATE (PF) 100 MCG/2ML IJ SOLN
INTRAMUSCULAR | Status: AC
  Filled 2018-01-08: qty 2

## 2018-01-08 MED ORDER — LIDOCAINE HCL (PF) 1 % IJ SOLN
INTRAMUSCULAR | Status: AC
  Filled 2018-01-08: qty 60

## 2018-01-08 MED ORDER — CEFAZOLIN SODIUM-DEXTROSE 1-4 GM/50ML-% IV SOLN
1.0000 g | Freq: Four times a day (QID) | INTRAVENOUS | Status: AC
  Administered 2018-01-08 – 2018-01-09 (×3): 1 g via INTRAVENOUS
  Filled 2018-01-08 (×4): qty 50

## 2018-01-08 MED ORDER — CHLORHEXIDINE GLUCONATE 4 % EX LIQD: 60.0000 mL | Freq: Once | CUTANEOUS | Status: DC

## 2018-01-08 MED ORDER — LIDOCAINE HCL (PF) 1 % IJ SOLN
INTRAMUSCULAR | Status: DC | PRN
  Administered 2018-01-08: 30 mL

## 2018-01-08 MED ORDER — POTASSIUM CHLORIDE CRYS ER 20 MEQ PO TBCR
40.0000 meq | EXTENDED_RELEASE_TABLET | Freq: Once | ORAL | Status: AC
  Administered 2018-01-08: 40 meq via ORAL
  Filled 2018-01-08: qty 2

## 2018-01-08 MED ORDER — SODIUM CHLORIDE 0.9 % IV SOLN
INTRAVENOUS | Status: AC
  Filled 2018-01-08: qty 2

## 2018-01-08 MED ORDER — TORSEMIDE 20 MG PO TABS: 20.0000 mg | ORAL_TABLET | Freq: Every day | ORAL | Status: DC

## 2018-01-08 MED ORDER — TORSEMIDE 20 MG PO TABS
20.0000 mg | ORAL_TABLET | Freq: Every day | ORAL | Status: DC
  Administered 2018-01-08 – 2018-01-09 (×2): 20 mg via ORAL
  Filled 2018-01-08 (×2): qty 1

## 2018-01-08 MED ORDER — SODIUM CHLORIDE 0.9 % IV SOLN
80.0000 mg | INTRAVENOUS | Status: AC
  Administered 2018-01-08: 80 mg

## 2018-01-08 MED ORDER — HEPARIN SODIUM (PORCINE) 5000 UNIT/ML IJ SOLN
5000.0000 [IU] | Freq: Three times a day (TID) | INTRAMUSCULAR | Status: DC
  Administered 2018-01-08 – 2018-01-09 (×3): 5000 [IU] via SUBCUTANEOUS
  Filled 2018-01-08 (×3): qty 1

## 2018-01-08 MED ORDER — SODIUM CHLORIDE 0.9 % IV SOLN
INTRAVENOUS | Status: DC
  Administered 2018-01-08: 18:00:00 via INTRAVENOUS

## 2018-01-08 MED ORDER — CEFAZOLIN SODIUM-DEXTROSE 2-4 GM/100ML-% IV SOLN
INTRAVENOUS | Status: AC
  Filled 2018-01-08: qty 100

## 2018-01-08 MED ORDER — HEPARIN (PORCINE) IN NACL 1000-0.9 UT/500ML-% IV SOLN
INTRAVENOUS | Status: DC | PRN
  Administered 2018-01-08: 500 mL

## 2018-01-08 SURGICAL SUPPLY — 12 items
CABLE SURGICAL S-101-97-12 (CABLE) ×3 IMPLANT
CATH RIGHTSITE C315HIS02 (CATHETERS) ×2 IMPLANT
IPG PACE AZUR XT DR MRI W1DR01 (Pacemaker) IMPLANT
LEAD CAPSURE NOVUS 5076-52CM (Lead) ×2 IMPLANT
LEAD SELECT SECURE 3830 383069 (Lead) IMPLANT
PACE AZURE XT DR MRI W1DR01 (Pacemaker) ×3 IMPLANT
PAD PRO RADIOLUCENT 2001M-C (PAD) ×3 IMPLANT
SELECT SECURE 3830 383069 (Lead) ×3 IMPLANT
SHEATH CLASSIC 7F (SHEATH) ×4 IMPLANT
SLITTER 6232ADJ (MISCELLANEOUS) ×2 IMPLANT
TRAY PACEMAKER INSERTION (PACKS) ×3 IMPLANT
WIRE HI TORQ VERSACORE-J 145CM (WIRE) ×2 IMPLANT

## 2018-01-08 NOTE — Progress Notes (Addendum)
Advanced Heart Failure Rounding Note   Subjective:    Was back in CHB with rate in 40s from 1600 - 0600. This am, junctional rhythm in 60s.   Feeling Ok this am. Denies SOB, lightheadedness or dizziness despite going back in and out of CHB.   Cr 1.89 this am. K 3.9.   Objective:   Weight Range:  Vital Signs:   Temp:  [97.7 F (36.5 C)-98.1 F (36.7 C)] 97.7 F (36.5 C) (09/26 0731) Pulse Rate:  [42-63] 63 (09/26 0731) Resp:  [15-30] 30 (09/26 0731) BP: (95-119)/(60-74) 116/74 (09/26 0731) SpO2:  [91 %-97 %] 97 % (09/26 0731) Weight:  [84.1 kg] 84.1 kg (09/26 0731) Last BM Date: 01/07/18  Weight change: Filed Weights   01/06/18 1850 01/08/18 0731  Weight: 80.6 kg 84.1 kg    Intake/Output:   Intake/Output Summary (Last 24 hours) at 01/08/2018 0820 Last data filed at 01/08/2018 0600 Gross per 24 hour  Intake 88.75 ml  Output 700 ml  Net -611.25 ml     Physical Exam   General: Elderly appearing. No resp difficulty. HEENT: Normal Neck: Supple. JVP 6-7. Carotids 2+ bilat; no bruits. No thyromegaly or nodule noted. Cor: PMI nondisplaced. Slightly brady, regular. No M/G/R noted Lungs: CTAB, normal effort. Abdomen: Soft, non-tender, non-distended, no HSM. No bruits or masses. +BS  Extremities: No cyanosis, clubbing, or rash. R and LLE no edema.  Neuro: Alert & orientedx3, cranial nerves grossly intact. moves all 4 extremities w/o difficulty. Affect pleasant   Telemetry   Was back in CHB with rate in 40s from 1600 - 0600. This am, junctional rhythm in 60s. Personally reviewed  Labs    Basic Metabolic Panel: Recent Labs  Lab 01/05/18 1431 01/06/18 1427 01/07/18 0548 01/08/18 0234  NA 141 141 141 141  K 3.6 4.0 3.3* 3.9  CL 100 98 99 104  CO2 _0 GLUCOSE 181* 141* 109* 127*  BUN 28* 33* 32* 33*  CREATININE 2.04* 2.11* 1.98* 1.89*  CALCIUM 9.3 9.3 9.0 9.0    Liver Function Tests: No results for input(s): AST, ALT, ALKPHOS, BILITOT, PROT,  ALBUMIN in the last 168 hours. No results for input(s): LIPASE, AMYLASE in the last 168 hours. No results for input(s): AMMONIA in the last 168 hours.  CBC: Recent Labs  Lab 01/06/18 1427  WBC 8.8  HGB 11.7*  HCT 37.9*  MCV 98.2  PLT 126*    Cardiac Enzymes: No results for input(s): CKTOTAL, CKMB, CKMBINDEX, TROPONINI in the last 168 hours.  BNP: BNP (last 3 results) Recent Labs    11/05/17 1310 12/02/17 1846 01/05/18 1431  BNP 515.3* 349.6* 516.7*    ProBNP (last 3 results) No results for input(s): PROBNP in the last 8760 hours.    Other results:  Imaging: Ct Head Wo Contrast  Result Date: 01/06/2018 CLINICAL DATA:  Syncopal episode falling and hitting head, dizziness and nausea, the patient is on blood thinners EXAM: CT HEAD WITHOUT CONTRAST TECHNIQUE: Contiguous axial images were obtained from the base of the skull through the vertex without intravenous contrast. COMPARISON:  None. FINDINGS: The ventricular system is dilated as are the cortical sulci, consistent with diffuse atrophy. The septum is midline in position. The fourth ventricle and basilar cisterns are unremarkable. Moderate small vessel ischemic changes present throughout the periventricular white matter. No hemorrhage, mass lesion, or acute infarction is seen. Vascular: No vascular abnormality is evident on this unenhanced study. Skull: On bone window images, no  calvarial fracture is seen. No scalp hematoma is evident. Sinuses/Orbits: There is mild mucosal thickening in the floor of the right maxillary sinus. Other: None. IMPRESSION: 1. Atrophy and mild to moderate small vessel ischemic change. 2. No acute intracranial abnormality. Electronically Signed   By: Ivar Drape M.D.   On: 01/06/2018 15:22     Medications:     Scheduled Medications: . acidophilus   Oral Daily  . dronabinol  5 mg Oral BID AC  . gabapentin  400 mg Oral QHS  . Influenza vac split quadrivalent PF  0.5 mL Intramuscular  Tomorrow-1000  . Melatonin  3 mg Oral QHS  . pantoprazole  80 mg Oral Daily  . polyethylene glycol  17 g Oral Daily  . rosuvastatin  10 mg Oral Daily  . senna-docusate  1 tablet Oral q morning - 10a  . sertraline  25 mg Oral Daily  . sodium chloride flush  3 mL Intravenous Q12H  . Tafamidis Meglumine (Cardiac)  80 mg Oral QPM  . traZODone  50 mg Oral QHS  . vitamin B-12  500 mcg Oral QODAY    Infusions: . sodium chloride    . milrinone 0.125 mcg/kg/min (01/08/18 0600)    PRN Medications: sodium chloride, acetaminophen, ondansetron (ZOFRAN) IV, ondansetron, senna-docusate, sodium chloride flush, zolpidem   Assessment:   Dillon Costabile Knightis a 77 y.o.malewith a hx of chronic combined CHF due to NICM, CKD III,presumedNASH(per pt, never proven),esophageal varices per chart(prior glass of wine occasionally but no heavy alcohol),HTN, HLD, DM, Barrett's esophagus, GERD, anemia, RBBB  Admitted with syncope and fall in setting of bradycardia.   Plan/Discussion:     1. Syncope with fall in setting of #2 - Mechanical injury to head with laceration.  - CT head shows no acute abnormality.  - Likely in the setting of #2. No further.   2. Junctional rhtyhm with symptomatic bradycardia -> CHB. -He has been in AtrialFlutters/p DCCV 7/31, 8/2, 12/19/2017 - Amio on hold since 01/05/18 - Continues to go in and out of CHB. Was back in CHB with rate in 40s from 1600 - 0600. This am, junctional rhythm in 60s. Will discuss with MD.  - Eliquis on hold in case of procedure. - EP consulted to discuss PPM for CHB/tachy-brady but this is complicated by poor short-term prognosis and indwelling PICC line for home inotropes.He does not tolerate AF well. Will discuss possibiluty of trying amio 100 bid as a compromise and trying to avoid PPM. Though with his degree of conduction system disease - he may still need PPM.  3. 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  09/2016 with EF 40-45%. -Echo 06/2017: EF 25-30%, mild LAE, mild/mod RV dilation, mod/severe RA. -PYP scan strongly suggestive of TTR amyloid, SPEP with 0.8% m-spike, UPEP with Bence Jones Protein positive, kappa type. 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 - Dr Marin Olp saw and thinks he does not have AL amyloid (just multiple myeloma) and that he has TTR amyloid and should be treated with tafamidis before chemo. Tafamidis started on 8/2.  - Volume status OK.  - Can likely resume po torsemide.  - Continue milrinone 0.125 mcg. Managed by [facility] - Continue tafamidis 80 mg daily - No ARB/entresto/spirowith for now with CKD - No BB with low output/CHB   4. AKI on CKD III, baseline (1.4 - 1.6 11/2017) - Cr 1.89. Likely dehydrated in setting of malaise and decreased po intake.  - Improved slightly  today. Continue to hold diuretics  5. Kappa Lightchain Myeloma - Follows with Dr. Marin Olp  -Started on Bortezomib7/18/18andRevlimid7/23/19. - Dr Marin Olp thinks he does not have AL amyloid and that he does have TTR amyloid and should be treated with tafamodis before chemo. Now on Tafamidis.  - Have encouraged follow up with Dr Marin Olp. No change.   6. NASH with esophageal varices.  - [facility]3/[facility]/2016[facility] surveillance of Esophageal varices, Barrett's esophagus, and PMHx of Colon polyps. Showed two columns of small esophageal varices w/out stigmata, short segment Barrett's esophagus (without biopsies, due to presence of EVs), 5cm hiatal hernia, and hyperplastic small gastric nodules. - Hold Eliquis in case of procedure.  - No change to current plan.    7. H/o GI bleeds/Anemia - Hgb stable on admit. No overt bleeding.   8. Hypokalemia - K 3.9.   Length of Stay: 2  Annamaria Helling  01/08/2018, 8:20 AM  Advanced Heart Failure Team Pager [phone] (M-F; 7a - 4p)  Please contact [facility] for night-coverage after hours (4p -7a ) and weekends on  [email or web address]  Patient seen and examined with the above-signed Advanced Practice Provider and/or Housestaff. I personally reviewed laboratory data, imaging studies and relevant notes. I independently examined the patient and formulated the important aspects of the plan. I have edited the note to reflect any of my changes or salient points. I have personally discussed the plan with the patient and/or family.  Continues with intermittent high-grade HB in the setting of cardiac amyloidosis and amiodarone therapy for PAF. Amiodarone has been stopped. Also has underlying RBBB. Long discussion with him and his family about pros/cons of PPM including increased risk of infection in setting of home milrinone therapy. He would like to proceed with PPM. We have discussed with EP who agrees.   Glori Bickers, MD  6:21 PM

## 2018-01-08 NOTE — Plan of Care (Signed)
  Problem: Health Behavior/Discharge Planning: Goal: Ability to manage health-related needs will improve Outcome: Progressing   Problem: Clinical Measurements: Goal: Diagnostic test results will improve Outcome: Progressing   Problem: Activity: Goal: Risk for activity intolerance will decrease Outcome: Progressing   Problem: Coping: Goal: Level of anxiety will decrease Outcome: Progressing   Problem: Safety: Goal: Ability to remain free from injury will improve Outcome: Progressing   Problem: Cardiac: Goal: Ability to achieve and maintain adequate cardiopulmonary perfusion will improve Outcome: Progressing

## 2018-01-08 NOTE — Progress Notes (Signed)
Patient taken by cath lab staff via bed to cath lab for placement of permanent pacemaker.

## 2018-01-08 NOTE — Progress Notes (Signed)
Patient returned by cath lab staff via bed to 2C09 following pacemaker placement.

## 2018-01-08 NOTE — Progress Notes (Addendum)
Progress Note  Patient Name: Dillon Andrews Date of Encounter: 01/08/2018  Primary Cardiologist: Glenetta Hew, MD  AHF: Dr. [name]  Subjective   No CP, palpitations, no rest SOB, no dizziness, no syncope  Inpatient Medications    Scheduled Meds: . acidophilus   Oral Daily  . dronabinol  5 mg Oral BID AC  . gabapentin  400 mg Oral QHS  . heparin injection (subcutaneous)  5,000 Units Subcutaneous Q8H  . Influenza vac split quadrivalent PF  0.5 mL Intramuscular Tomorrow-1000  . Melatonin  3 mg Oral QHS  . pantoprazole  80 mg Oral Daily  . polyethylene glycol  17 g Oral Daily  . potassium chloride  40 mEq Oral Once  . rosuvastatin  10 mg Oral Daily  . senna-docusate  1 tablet Oral q morning - 10a  . sertraline  25 mg Oral Daily  . sodium chloride flush  3 mL Intravenous Q12H  . Tafamidis Meglumine (Cardiac)  80 mg Oral QPM  . torsemide  20 mg Oral Daily  . traZODone  50 mg Oral QHS  . vitamin B-12  500 mcg Oral QODAY   Continuous Infusions: . sodium chloride    . milrinone 0.125 mcg/kg/min (01/08/18 0600)   PRN Meds: sodium chloride, acetaminophen, ondansetron (ZOFRAN) IV, ondansetron, senna-docusate, sodium chloride flush, zolpidem   Vital Signs    Vitals:   01/07/18 1954 01/07/18 2314 01/08/18 0400 01/08/18 0731  BP: 110/60 118/60 111/60 116/74  Pulse: (!) 44 (!) 45 (!) 42 63  Resp: _0 (!) 30  Temp: 98.1 F (36.7 C) 98 F (36.7 C) 98.1 F (36.7 C) 97.7 F (36.5 C)  TempSrc: Oral Oral Oral Oral  SpO2: 93% 96% 95% 97%  Weight:    84.1 kg  Height:        Intake/Output Summary (Last 24 hours) at 01/08/2018 0956 Last data filed at 01/08/2018 0600 Gross per 24 hour  Intake 62.11 ml  Output 450 ml  Net -387.89 ml   Filed Weights   01/06/18 1850 01/08/18 0731  Weight: 80.6 kg 84.1 kg    Telemetry    SR with 1:1 conduction again this AM, overnight with recurrent 2:1 rates 30's - Personally Reviewed  ECG    No new EKGs - Personally  Reviewed  Physical Exam   GEN: No acute distress.   Neck: No JVD Cardiac: RRR, no murmurs, rubs, or gallops.  Respiratory: CTA b/l. GI: Soft, nontender, non-distended  MS: No edema; No deformity. Neuro:  Nonfocal  Psych: Normal affect   Labs    Chemistry Recent Labs  Lab 01/06/18 1427 01/07/18 0548 01/08/18 0234  NA 141 141 141  K 4.0 3.3* 3.9  CL 98 99 104  CO2 _1 GLUCOSE 141* 109* 127*  BUN 33* 32* 33*  CREATININE 2.11* 1.98* 1.89*  CALCIUM 9.3 9.0 9.0  GFRNONAA 28* 31* 32*  GFRAA 33* 35* 38*  ANIONGAP _2 Hematology Recent Labs  Lab 01/06/18 1427  WBC 8.8  RBC 3.86*  HGB 11.7*  HCT 37.9*  MCV 98.2  MCH 30.3  MCHC 30.9  RDW 24.0*  PLT 126*    Cardiac EnzymesNo results for input(s): TROPONINI in the last 168 hours.  Recent Labs  Lab 01/06/18 1512  TROPIPOC 0.36*     BNP Recent Labs  Lab 01/05/18 1431  BNP 516.7*     DDimer No results for input(s): DDIMER in the last 168 hours.  Radiology      Cardiac Studies   11/10/17: TEE/DCCV FINDINGS: LEFT VENTRICLE: EF =10% global HK with smoke  RIGHT VENTRICLE:Severely reduced.  LEFT ATRIUM:Moderately dilated 4.8 cm  LEFT ATRIAL APPENDAGE:No clot  RIGHT ATRIUM:Dilated. +PICC  AORTIC VALVE: Trileaflet. No AI/AS  MITRAL VALVE:Normal. Mild MR  TRICUSPID VALVE:Normal Moderate TR  PULMONIC VALVE:Trivial PR  INTERATRIAL SEPTUM:No PFO  PERICARDIUM:No effusion  DESCENDING AORTA:Mild plaque  CARDIOVERSION: Indications:Atrial Flutter Procedure Details: Once the TEE was complete, the patient had the defibrillator pads placed in the anterior and posterior position. Once an appropriate level of sedation was achieved, the patient received a single biphasic, synchronized150J shock with prompt conversion to sinus rhythm. No apparent complications.   06/26/17: TTE Study Conclusions - Left ventricle: The cavity size was normal. Wall thickness  was increased in a pattern of severe LVH. Systolic function was severely reduced. The estimated ejection fraction was in the range of 25% to 30%. Mild hypokinesis of the basalanteroseptal myocardium. - Aortic valve: Mildly calcified annulus. Trileaflet; normal thickness, mildly calcified leaflets. - Left atrium: The atrium was mildly dilated. - Right ventricle: The cavity size was mildly to moderately dilated. Wall thickness was normal. - Right atrium: The atrium was moderately to severely dilated.  NICM by cath at [facility] 09/2016 with EF 40-45%.  Patient Profile     77 y.o. male with a hx of chronic CHF (NICM, CKD (III), presumed NASH, reported only occasional wine, esophageal varices, Barrett's esophagus, GERD, anemia, HTN, HLD, RBBB, admitted with syncope and CHB  Assessment & Plan    1. Syncope 2. Advanced heart block on admission, CHB     His amiodarone held the day prior in the office (on chronically 295m BID)     About 0600 01/07/18 he regained 1:1 conduction, appears to have 2 different atrial foci  In review of Dr. BClayborne Dananote, not felt an ICD candidate given poor prognosis, he has regained conduction this AM.  Amiodarone not out of his system.  I Tjuana Vickrey review case with Dr. KCaryl Comes he has increased infection risk, though sounds like he tolerates AFlutter poorly despite rate control.  He has had some more intermittent high AV block, amiodarone wil take months to wash out, and given his AFlutter poorly tolerated, Arias Weinert need to keep on board.  Plan for PPM this afternoon if schedule allows.  Dr. CCurt Bearshas seen and examined the patient, discussed PPM implant procedure, potential risks/benefits and he is agreeable to proceed.  2. PAFlutter, appears an atypical flutter     CHA2DS2Vasc is 4, on Eliquis, (low dose) (?)     Hx hx of rectal bleed requiring PRBC July 2019, Eliquis initially held, resumed at low dose (noted hx of esophageal varices/barrett's as well)      11/10/17 TEE noted LVEF 10% with smoke in LV, no thrombus, no LAA thrombus, cardiverted     If able, I would revisit full dose eliquis  Eliquis is on hold here for possible need of pacing, maintaining SR  3. Chronic CHF  4. NICM In review of AHF notes: PYP scan strongly suggestive of TTR amyloid, SPEP with 0.8% m-spike, UPEP with Bence Jones Protein positive, kappa type. 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 - Dr EMarin Olpsaw and thinks he does not have AL amyloid (just multiple myeloma) and that he has TTR amyloid and should be treated with tafamidis before chemo. Tafamidis started on 8/2.  He is on Home milrinone, diuretic, no BB with low output failure, no  ACE/ARB with CKD, off hydralazine/imdur 2/2 soft BPs   For questions or updates, please contact [place] Please consult [email or web address] for contact info under        Signed, Baldwin Jamaica, PA-C  01/08/2018, 9:56 AM    I have seen and examined this patient with [name].  Agree with above, note added to reflect my findings.  On exam, RRR, no murmurs, lungs clear.  Patient with continued episodes of heart block.  Amiodarone has been stopped, though it would be beneficial to have it on board to due to recurrent atrial arrhythmias.  We Wasil Wolke plan for pacemaker implant today.  Risks and benefits were discussed and include bleeding, tamponade, infection, pneumothorax.  The patient's infection is higher than usual due to an indwelling PICC line.  The patient understands these risks and is agreed to the procedure.  Saraya Tirey M. Reymundo Winship MD 01/08/2018 11:19 AM

## 2018-01-09 ENCOUNTER — Encounter (HOSPITAL_COMMUNITY): Payer: Self-pay | Admitting: Cardiology

## 2018-01-09 ENCOUNTER — Inpatient Hospital Stay (HOSPITAL_COMMUNITY): Payer: Medicare HMO

## 2018-01-09 DIAGNOSIS — N183 Chronic kidney disease, stage 3 (moderate): Secondary | ICD-10-CM

## 2018-01-09 DIAGNOSIS — K7581 Nonalcoholic steatohepatitis (NASH): Secondary | ICD-10-CM

## 2018-01-09 DIAGNOSIS — E876 Hypokalemia: Secondary | ICD-10-CM

## 2018-01-09 DIAGNOSIS — Z23 Encounter for immunization: Secondary | ICD-10-CM | POA: Diagnosis not present

## 2018-01-09 LAB — BASIC METABOLIC PANEL
Anion gap: 8 (ref 5–15)
BUN: 30 mg/dL — AB (ref 8–23)
CHLORIDE: 103 mmol/L (ref 98–111)
CO2: 31 mmol/L (ref 22–32)
CREATININE: 1.76 mg/dL — AB (ref 0.61–1.24)
Calcium: 8.5 mg/dL — ABNORMAL LOW (ref 8.9–10.3)
GFR calc Af Amer: 41 mL/min — ABNORMAL LOW (ref >60)
GFR calc non Af Amer: 35 mL/min — ABNORMAL LOW (ref >60)
Glucose, Bld: 109 mg/dL — ABNORMAL HIGH (ref 70–99)
POTASSIUM: 3.5 mmol/L (ref 3.5–5.1)
SODIUM: 142 mmol/L (ref 135–145)

## 2018-01-09 LAB — COOXEMETRY PANEL
CARBOXYHEMOGLOBIN: 1.6 % — AB (ref 0.5–1.5)
METHEMOGLOBIN: 1.6 % — AB (ref 0.0–1.5)
O2 SAT: 74.3 %
TOTAL HEMOGLOBIN: 11 g/dL — AB (ref 12.0–16.0)

## 2018-01-09 MED ORDER — TRAMADOL HCL 50 MG PO TABS
50.0000 mg | ORAL_TABLET | Freq: Four times a day (QID) | ORAL | Status: DC | PRN
  Administered 2018-01-09: 50 mg via ORAL
  Filled 2018-01-09: qty 1

## 2018-01-09 MED ORDER — APIXABAN 2.5 MG PO TABS: 2.5000 mg | ORAL_TABLET | Freq: Two times a day (BID) | ORAL | 11 refills | Status: DC

## 2018-01-09 MED ORDER — APIXABAN 5 MG PO TABS: 5.0000 mg | ORAL_TABLET | Freq: Two times a day (BID) | ORAL | Status: DC

## 2018-01-09 MED ORDER — APIXABAN 2.5 MG PO TABS: 2.5000 mg | ORAL_TABLET | Freq: Two times a day (BID) | ORAL | Status: DC

## 2018-01-09 MED ORDER — TRAMADOL HCL 50 MG PO TABS: 50.0000 mg | ORAL_TABLET | Freq: Four times a day (QID) | ORAL | 0 refills | Status: DC | PRN

## 2018-01-09 MED ORDER — POTASSIUM CHLORIDE CRYS ER 20 MEQ PO TBCR
40.0000 meq | EXTENDED_RELEASE_TABLET | Freq: Once | ORAL | Status: AC
  Administered 2018-01-09: 40 meq via ORAL
  Filled 2018-01-09: qty 2

## 2018-01-09 MED FILL — Midazolam HCl Inj 5 MG/5ML (Base Equivalent): INTRAMUSCULAR | Qty: 5 | Status: AC

## 2018-01-09 MED FILL — Fentanyl Citrate Preservative Free (PF) Inj 100 MCG/2ML: INTRAMUSCULAR | Qty: 2 | Status: AC

## 2018-01-09 NOTE — Discharge Summary (Addendum)
Advanced Heart Failure Team  Discharge Summary   Patient ID: Dillon Andrews MRN: [id number], DOB/AGE: 06/27/1940 77 y.o. Admit date: 01/06/2018 D/C date:     01/09/2018   Primary Discharge Diagnoses:  1. Syncope 2. Complete Heart Block  3. 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 AKI on CKD Stage III, baseline 1.4-1.6  5. Kappa Lightchain Myeloma 6. NASH with esophageal varices  7 H/O GI Bleed/Anemia 8. Hypokalemia 9. PAF - on eliquis.      Hospital Course:  Nelton Amsden Knightis a 77 y.o.malewith a hx of chronic combined CHF due to NICM, CKD III,presumedNASH(per pt, never proven),esophageal varices per chart(prior glass of wine occasionally but no heavy alcohol),HTN, HLD, DM, Barrett's esophagus, GERD, anemia, RBBB  Admitted with syncope and fall in setting of bradycardia. CT of head was negative for bleed. EKG showed complete heart block. Amiodarone was stopped. EP consulted and on 9/26 he underwent Medtronic PPM. Eliquis was restarted on 01/09/2018.  He did well after the procedure and will continue with L arm sling per EP recommendations.   From HF perspective he remained stable. Amiodarone stopped as noted above. He is not on arb/spiro/dig with CKD. He will continue on milrinone 0.125 mcg via PICC. Renal function was followed closely and remained stable. [facility] will follow for home milrinone. Plan to continue weekly blood work.   We will continue closely in the HF clinic. He will follow up 10/9  for wound check in the device clinic.    Discharge Weight: 175 pounds.  Discharge Vitals: Blood pressure (!) 101/57, pulse 65, temperature 98.1 F (36.7 C), temperature source Oral, resp. rate 19, height 5' 10"  (1.778 m), weight 79.7 kg, SpO2 95 %.  Labs: Lab Results  Component Value Date   WBC 8.8 01/06/2018   HGB 11.7 (L) 01/06/2018   HCT 37.9 (L) 01/06/2018   MCV 98.2 01/06/2018   PLT 126 (L) 01/06/2018    Recent Labs  Lab 01/09/18 0332  NA  142  K 3.5  CL 103  CO2 31  BUN 30*  CREATININE 1.76*  CALCIUM 8.5*  GLUCOSE 109*   No results found for: CHOL, HDL, LDLCALC, TRIG BNP (last 3 results) Recent Labs    11/05/17 1310 12/02/17 1846 01/05/18 1431  BNP 515.3* 349.6* 516.7*    ProBNP (last 3 results) No results for input(s): PROBNP in the last 8760 hours.   Diagnostic Studies/Procedures   Dg Chest 2 View  Result Date: 01/09/2018 CLINICAL DATA:  Status post ICD insertion EXAM: CHEST - 2 VIEW COMPARISON:  12/02/2017 FINDINGS: Right-sided jugular central line is again noted in the mid superior vena cava and stable. Cardiac shadow is mildly enlarged. Aortic calcifications are again seen. Pacing device is now seen. No pneumothorax is noted. Small bilateral pleural effusions are better visualized on today's exam. Mild left basilar atelectasis is noted in the retrocardiac region relatively stable from the prior study. IMPRESSION: No pneumothorax following pacemaker placement. Bilateral pleural effusions and left basilar atelectasis are noted. Electronically Signed   By: Inez Catalina M.D.   On: 01/09/2018 07:05    Discharge Medications   Allergies as of 01/09/2018   No Known Allergies     Medication List    STOP taking these medications   amiodarone 200 MG tablet Commonly known as:  PACERONE     TAKE these medications   apixaban 2.5 MG Tabs tablet Commonly known as:  ELIQUIS Take 1 tablet (2.5 mg total) by mouth 2 (  two) times daily.   dronabinol 5 MG capsule Commonly known as:  MARINOL Take 1 capsule (5 mg total) by mouth 2 (two) times daily before lunch and supper.   gabapentin 800 MG tablet Commonly known as:  NEURONTIN TAKE 1 TABLET BY MOUTH AT BEDTIME What changed:  how much to take   glucose blood test strip Use as instructed   hydrocortisone 25 MG suppository Commonly known as:  ANUSOL-HC Place 1 suppository (25 mg total) rectally 2 (two) times daily. What changed:    when to take  this  reasons to take this   Melatonin 5 MG Tabs Take 5 mg by mouth at bedtime.   milrinone 20 MG/100 ML Soln infusion Commonly known as:  PRIMACOR Inject 0.0099 mg/min into the vein continuous.   omeprazole 40 MG capsule Commonly known as:  PRILOSEC Take 1 capsule (40 mg total) by mouth daily.   ondansetron 4 MG tablet Commonly known as:  ZOFRAN Take 1 tablet (4 mg total) by mouth every 8 (eight) hours as needed for nausea or vomiting.   polyethylene glycol packet Commonly known as:  MIRALAX / GLYCOLAX Take 17 g by mouth daily. What changed:    when to take this  reasons to take this  additional instructions   potassium chloride SA 20 MEQ tablet Commonly known as:  K-DUR,KLOR-CON Take 20 mEq by mouth 2 (two) times daily.   PROBIOTIC DAILY PO Take 1 tablet by mouth daily.   rosuvastatin 10 MG tablet Commonly known as:  CRESTOR TAKE 1 TABLET BY MOUTH ONCE DAILY   senna-docusate 8.6-50 MG tablet Commonly known as:  Senokot-S Take 2 tablets by mouth 2 (two) times daily. What changed:    how much to take  when to take this  additional instructions   sertraline 25 MG tablet Commonly known as:  ZOLOFT TAKE 1 TABLET BY MOUTH EVERY DAY   torsemide 20 MG tablet Commonly known as:  DEMADEX Take 1 tablet (20 mg total) by mouth daily. Can take 20 mg extra as needed. What changed:    when to take this  additional instructions   traMADol 50 MG tablet Commonly known as:  ULTRAM Take 1 tablet (50 mg total) by mouth every 6 (six) hours as needed for moderate pain.   traZODone 50 MG tablet Commonly known as:  DESYREL TAKE 1 TABLET(50 MG) BY MOUTH AT BEDTIME What changed:  See the new instructions.   vitamin B-12 500 MCG tablet Commonly known as:  CYANOCOBALAMIN Take 500 mcg by mouth every other day.   VYNDAQEL 20 MG Caps Generic drug:  Tafamidis Meglumine (Cardiac) Take 80 mg by mouth daily. (4 capsules) What changed:    when to take this  additional  instructions   zolpidem 5 MG tablet Commonly known as:  AMBIEN Take 1 tablet (5 mg total) by mouth at bedtime.            Durable Medical Equipment  (From admission, onward)         Start     Ordered   01/09/18 0905  Heart failure home health orders  (Heart failure home health orders / Face to face)  Once    Comments:  Heart Failure Follow-up Care:  Verify follow-up appointments per Patient Discharge Instructions. Confirm transportation arranged. Reconcile home medications with discharge medication list. Remove discontinued medications from use. Assist patient/caregiver to manage medications using pill box. Reinforce low sodium food selection Assessments: Vital signs and oxygen saturation at each visit. Assess  home environment for safety concerns, caregiver support and availability of low-sodium foods. Consult Education officer, museum, PT/OT, Dietitian, and CNA based on assessments. Perform comprehensive cardiopulmonary assessment. Notify MD for any change in condition or weight gain of 3 pounds in one day or 5 pounds in one week with symptoms. Daily Weights and Symptom Monitoring: Ensure patient has access to scales. Teach patient/caregiver to weigh daily before breakfast and after voiding using same scale and record.    Teach patient/caregiver to track weight and symptoms and when to notify Provider. Activity: Develop individualized activity plan with patient/caregiver.  Resume HH milrinone 0.125 mcg  [facility] to provide  Labs every other week to include BMET, Mg, and CBC with Diff. Additional as needed. Should be drawn via PERIPHERAL stick. NOT PICC line.   E9937 Milrinone 0.125 mcg/kg/min X 52 weeks A4221 Supplies for maintenance of drug infusion catheter A4222 Supplies for the external drug infusion per cassette or bag E0781 Ambulatory Infusion pump  Question Answer Comment  Heart Failure Follow-up Care Advanced Heart Failure (AHF) Clinic at [phone]   Obtain the following labs  Basic Metabolic Panel   Lab frequency Weekly   Fax lab results to AHF Clinic at [phone]   Diet Low Sodium Heart Healthy   Fluid restrictions: 2000 mL Fluid      01/09/18 0905          Disposition   The patient will be discharged in stable condition to home. Discharge Instructions    Diet - low sodium heart healthy   Complete by:  As directed    Heart Failure patients record your daily weight using the same scale at the same time of day   Complete by:  As directed    Increase activity slowly   Complete by:  As directed      Follow-up Information    [place] [facility] Follow up on 01/21/2018.   Specialty:  Cardiology Why:  2:00PM, wound check visit Contact information: [address] [place]       Constance Haw, MD Follow up in 3 month(s).   Specialty:  Cardiology Why:  You will be called to scheduile a 3 month follow up visit with Dr. Joya San information: [address] [place] [address] [phone]        [facility] Follow up on 01/15/2018.   Specialty:  Cardiology Why:  at 2:00 Tensed information: [address] [id number] [place] [phone]            Duration of Discharge Encounter: Greater than 35 minutes   Signed, Darrick Grinder NP-C  01/09/2018, 11:57 AM  Agree with above. He is improved after PPM. Continue milrinone. Will follow in HF clinic.  Glori Bickers, MD  9:44 PM

## 2018-01-09 NOTE — Progress Notes (Signed)
Verbal and written discharge instructions given to patient and patient's daughter. All questions answered. PIV removed per discharge protocol. CVC left in place for home Milrinone. Written prescription given to patient. All personal belongings, including medication stored in pharmacy, returned to patient. Patient transported by CNA via wheelchair to his daughter's waiting car for discharge.

## 2018-01-09 NOTE — Progress Notes (Addendum)
Progress Note  Patient Name: Dillon Andrews Date of Encounter: 01/09/2018  Primary Cardiologist: Glenetta Hew, MD  AHF: Dr. [name]  Subjective   No CP, palpitations, no rest SOB, no dizziness, no syncope, denies any significant pain at [facility] site  Inpatient Medications    Scheduled Meds: . acidophilus   Oral Daily  . apixaban  2.5 mg Oral BID  . chlorhexidine  60 mL Topical Once  . chlorhexidine  60 mL Topical Once  . dronabinol  5 mg Oral BID AC  . gabapentin  400 mg Oral QHS  . Influenza vac split quadrivalent PF  0.5 mL Intramuscular Tomorrow-1000  . Melatonin  3 mg Oral QHS  . pantoprazole  80 mg Oral Daily  . polyethylene glycol  17 g Oral Daily  . rosuvastatin  10 mg Oral Daily  . senna-docusate  1 tablet Oral q morning - 10a  . sertraline  25 mg Oral Daily  . sodium chloride flush  3 mL Intravenous Q12H  . Tafamidis Meglumine (Cardiac)  80 mg Oral QPM  . torsemide  20 mg Oral Daily  . traZODone  50 mg Oral QHS  . vitamin B-12  500 mcg Oral QODAY   Continuous Infusions: . sodium chloride    . sodium chloride 50 mL/hr at 01/08/18 1827  . milrinone 0.125 mcg/kg/min (01/09/18 0405)   PRN Meds: sodium chloride, acetaminophen, ondansetron (ZOFRAN) IV, ondansetron, senna-docusate, sodium chloride flush, traMADol, zolpidem   Vital Signs    Vitals:   01/08/18 1541 01/08/18 1931 01/09/18 0001 01/09/18 0500  BP: 107/67 (!) 94/58 (!) 101/57   Pulse: 60 64 65   Resp: 17 (!) 24 19   Temp: 97.9 F (36.6 C) 98 F (36.7 C) 98.1 F (36.7 C)   TempSrc: Oral Oral Oral   SpO2: 94% 93% 95%   Weight:    79.7 kg  Height:        Intake/Output Summary (Last 24 hours) at 01/09/2018 1109 Last data filed at 01/08/2018 2300 Gross per 24 hour  Intake 426.87 ml  Output 1100 ml  Net -673.13 ml   Filed Weights   01/06/18 1850 01/08/18 0731 01/09/18 0500  Weight: 80.6 kg 84.1 kg 79.7 kg    Telemetry    SR with intermittent pacing  ECG    SR/V paced, some AV paced  beats - Personally Reviewed  Physical Exam   GEN: No acute distress.   Neck: No JVD Cardiac: RRR, no murmurs, rubs, or gallops.  Respiratory: CTA b/l. GI: Soft, nontender, non-distended  MS: No edema; No deformity. Neuro:  Nonfocal  Psych: Normal affect  PPM site is dry, no hematoma  Labs    Chemistry Recent Labs  Lab 01/07/18 0548 01/08/18 0234 01/09/18 0332  NA 141 141 142  K 3.3* 3.9 3.5  CL 99 104 103  CO2 29 31 31   GLUCOSE 109* 127* 109*  BUN 32* 33* 30*  CREATININE 1.98* 1.89* 1.76*  CALCIUM 9.0 9.0 8.5*  GFRNONAA 31* 32* 35*  GFRAA 35* 38* 41*  ANIONGAP 13 6 8      Hematology Recent Labs  Lab 01/06/18 1427  WBC 8.8  RBC 3.86*  HGB 11.7*  HCT 37.9*  MCV 98.2  MCH 30.3  MCHC 30.9  RDW 24.0*  PLT 126*    Cardiac EnzymesNo results for input(s): TROPONINI in the last 168 hours.  Recent Labs  Lab 01/06/18 1512  TROPIPOC 0.36*     BNP Recent Labs  Lab 01/05/18  1431  BNP 516.7*     DDimer No results for input(s): DDIMER in the last 168 hours.   Radiology      Cardiac Studies   11/10/17: TEE/DCCV FINDINGS: LEFT VENTRICLE: EF =10% global HK with smoke  RIGHT VENTRICLE:Severely reduced.  LEFT ATRIUM:Moderately dilated 4.8 cm  LEFT ATRIAL APPENDAGE:No clot  RIGHT ATRIUM:Dilated. +PICC  AORTIC VALVE: Trileaflet. No AI/AS  MITRAL VALVE:Normal. Mild MR  TRICUSPID VALVE:Normal Moderate TR  PULMONIC VALVE:Trivial PR  INTERATRIAL SEPTUM:No PFO  PERICARDIUM:No effusion  DESCENDING AORTA:Mild plaque  CARDIOVERSION: Indications:Atrial Flutter Procedure Details: Once the TEE was complete, the patient had the defibrillator pads placed in the anterior and posterior position. Once an appropriate level of sedation was achieved, the patient received a single biphasic, synchronized150J shock with prompt conversion to sinus rhythm. No apparent complications.   06/26/17: TTE Study Conclusions - Left  ventricle: The cavity size was normal. Wall thickness was increased in a pattern of severe LVH. Systolic function was severely reduced. The estimated ejection fraction was in the range of 25% to 30%. Mild hypokinesis of the basalanteroseptal myocardium. - Aortic valve: Mildly calcified annulus. Trileaflet; normal thickness, mildly calcified leaflets. - Left atrium: The atrium was mildly dilated. - Right ventricle: The cavity size was mildly to moderately dilated. Wall thickness was normal. - Right atrium: The atrium was moderately to severely dilated.  NICM by cath at [facility] 09/2016 with EF 40-45%.  Patient Profile     77 y.o. male with a hx of chronic CHF (NICM, CKD (III), presumed NASH, reported only occasional wine, esophageal varices, Barrett's esophagus, GERD, anemia, HTN, HLD, RBBB, admitted with syncope and CHB  Assessment & Plan    1. Syncope 2. Advanced heart block on admission, CHB     His amiodarone held the day prior in the office (on chronically 233m BID)     About 0600 01/07/18 he regained 1:1 conduction, appears to have 2 different atrial foci  In review of Dr. BClayborne Dananote, not felt an ICD candidate given poor prognosis, he has regained conduction this AM.  Amiodarone not out of his system.  I Adalind Weitz review case with Dr. KCaryl Comes he has increased infection risk, though sounds like he tolerates AFlutter poorly despite rate control.  Now s/p PPM implant with Dr. CCurt Bearsyesterday CXR this AM without pptx Device check this AM with intact function Wound care and activity instructions were discussed with the patient by Dr. CCurt BearsEP follow up is arranged   2. PAFlutter, appears an atypical flutter     CHA2DS2Vasc is 4, on Eliquis, (low dose) (?)     Hx hx of rectal bleed requiring PRBC July 2019, Eliquis initially held, resumed at low dose (noted hx of esophageal varices/barrett's as well)     11/10/17 TEE noted LVEF 10% with smoke in LV, no thrombus, no LAA  thrombus, cardiverted     If able, I would revisit full dose eliquis  OK to resume Eliquis this evening OK to resume amiodarone  3. Chronic CHF  4. NICM In review of AHF notes: PYP scan strongly suggestive of TTR amyloid, SPEP with 0.8% m-spike, UPEP with Bence Jones Protein positive, kappa type. 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 - Dr EMarin Olpsaw and thinks he does not have AL amyloid (just multiple myeloma) and that he has TTR amyloid and should be treated with tafamidis before chemo. Tafamidis started on 8/2.  He is on Home milrinone, diuretic, no BB with low output failure, no  ACE/ARB with CKD, off hydralazine/imdur 2/2 soft BPs   EP service Zoha Spranger sign off though remain available, please recall if needed.   For questions or updates, please contact [facility] Please consult [email or web address] for contact info under        Signed, Baldwin Jamaica, PA-C  01/09/2018, 11:09 AM     I have seen and examined this patient with [name].  Agree with above, note added to reflect my findings.  On exam, RRR, no murmurs, lungs clear.  Medtronic dual-chamber pacemaker implanted yesterday for complete heart block.  Patient AV pacing today and feeling much improved.  At this point okay to discharge per EP.  We Elener Custodio arrange follow-up in device clinic.  Sylena Lotter M. Kashari Chalmers MD 01/09/2018 11:15 AM  [facility] Rosaire Cueto sign off.   Medication Recommendations:  none Other recommendations (labs, testing, etc):  none Follow up as an outpatient:  Device clinic arranged

## 2018-01-09 NOTE — Progress Notes (Addendum)
Advanced Heart Failure Rounding Note   Subjective:    S/P Medtronic PPM 9/26   Complaining of pain at insertion site.   Denies SOB. Wants to home. Says he feels better than he is has in a long time.    Objective:   Weight Range:  Vital Signs:   Temp:  [97.6 F (36.4 C)-98.1 F (36.7 C)] 98.1 F (36.7 C) (09/27 0001) Pulse Rate:  [56-139] 65 (09/27 0001) Resp:  [9-60] 19 (09/27 0001) BP: (94-118)/(57-74) 101/57 (09/27 0001) SpO2:  [92 %-98 %] 95 % (09/27 0001) Weight:  [79.7 kg] 79.7 kg (09/27 0500) Last BM Date: 01/08/18  Weight change: Filed Weights   01/06/18 1850 01/08/18 0731 01/09/18 0500  Weight: 80.6 kg 84.1 kg 79.7 kg    Intake/Output:   Intake/Output Summary (Last 24 hours) at 01/09/2018 0842 Last data filed at 01/08/2018 2300 Gross per 24 hour  Intake 426.87 ml  Output 1100 ml  Net -673.13 ml     Physical Exam   General:  No resp difficulty. In bed.  HEENT: normal Neck: supple. no JVD. Carotids 2+ bilat; no bruits. No lymphadenopathy or thryomegaly appreciated. Cor: PMI nondisplaced. Regular rate & rhythm. No rubs, gallops or murmurs. Lupper chest dressing CDI. R upper chest tunneled PICC.  Lungs: clear Abdomen: soft, nontender, nondistended. No hepatosplenomegaly. No bruits or masses. Good bowel sounds. Extremities: no cyanosis, clubbing, rash, edema. LUE sling in place.  Neuro: alert & orientedx3, cranial nerves grossly intact. moves all 4 extremities w/o difficulty. Affect pleasant  Telemetry   A sensed V paced 60s. Personally reviewed   Labs    Basic Metabolic Panel: Recent Labs  Lab 01/05/18 1431 01/06/18 1427 01/07/18 0548 01/08/18 0234 01/09/18 0332  NA 141 141 141 141 142  K 3.6 4.0 3.3* 3.9 3.5  CL 100 98 99 104 103  CO2 29 29 29 31 31   GLUCOSE 181* 141* 109* 127* 109*  BUN 28* 33* 32* 33* 30*  CREATININE 2.04* 2.11* 1.98* 1.89* 1.76*  CALCIUM 9.3 9.3 9.0 9.0 8.5*    Liver Function Tests: No results for input(s):  AST, ALT, ALKPHOS, BILITOT, PROT, ALBUMIN in the last 168 hours. No results for input(s): LIPASE, AMYLASE in the last 168 hours. No results for input(s): AMMONIA in the last 168 hours.  CBC: Recent Labs  Lab 01/06/18 1427  WBC 8.8  HGB 11.7*  HCT 37.9*  MCV 98.2  PLT 126*    Cardiac Enzymes: No results for input(s): CKTOTAL, CKMB, CKMBINDEX, TROPONINI in the last 168 hours.  BNP: BNP (last 3 results) Recent Labs    11/05/17 1310 12/02/17 1846 01/05/18 1431  BNP 515.3* 349.6* 516.7*    ProBNP (last 3 results) No results for input(s): PROBNP in the last 8760 hours.    Other results:  Imaging: Dg Chest 2 View  Result Date: 01/09/2018 CLINICAL DATA:  Status post ICD insertion EXAM: CHEST - 2 VIEW COMPARISON:  12/02/2017 FINDINGS: Right-sided jugular central line is again noted in the mid superior vena cava and stable. Cardiac shadow is mildly enlarged. Aortic calcifications are again seen. Pacing device is now seen. No pneumothorax is noted. Small bilateral pleural effusions are better visualized on today's exam. Mild left basilar atelectasis is noted in the retrocardiac region relatively stable from the prior study. IMPRESSION: No pneumothorax following pacemaker placement. Bilateral pleural effusions and left basilar atelectasis are noted. Electronically Signed   By: Inez Catalina M.D.   On: 01/09/2018 07:05  Medications:     Scheduled Medications: . acidophilus   Oral Daily  . chlorhexidine  60 mL Topical Once  . chlorhexidine  60 mL Topical Once  . dronabinol  5 mg Oral BID AC  . gabapentin  400 mg Oral QHS  . heparin injection (subcutaneous)  5,000 Units Subcutaneous Q8H  . Influenza vac split quadrivalent PF  0.5 mL Intramuscular Tomorrow-1000  . Melatonin  3 mg Oral QHS  . pantoprazole  80 mg Oral Daily  . polyethylene glycol  17 g Oral Daily  . rosuvastatin  10 mg Oral Daily  . senna-docusate  1 tablet Oral q morning - 10a  . sertraline  25 mg Oral  Daily  . sodium chloride flush  3 mL Intravenous Q12H  . Tafamidis Meglumine (Cardiac)  80 mg Oral QPM  . torsemide  20 mg Oral Daily  . traZODone  50 mg Oral QHS  . vitamin B-12  500 mcg Oral QODAY    Infusions: . sodium chloride    . sodium chloride 50 mL/hr at 01/08/18 1827  . milrinone 0.125 mcg/kg/min (01/09/18 0405)    PRN Medications: sodium chloride, acetaminophen, ondansetron (ZOFRAN) IV, ondansetron, senna-docusate, sodium chloride flush, zolpidem   Assessment:   Dillon Diana Knightis a 77 y.o.malewith a hx of chronic combined CHF due to NICM, CKD III,presumedNASH(per pt, never proven),esophageal varices per chart(prior glass of wine occasionally but no heavy alcohol),HTN, HLD, DM, Barrett's esophagus, GERD, anemia, RBBB  Admitted with syncope and fall in setting of bradycardia.   Plan/Discussion:     1. Syncope with fall in setting of #2 - Mechanical injury to head with laceration.  - CT head shows no acute abnormality.  - Likely in the setting of #2. No further.  2. Junctional rhtyhm with symptomatic bradycardia -> CHB. -He has been in [facility]/p DCCV 7/31, 8/2, 12/19/2017 - Amio on hold since 01/05/18 - S/P Medtronic PPM - Discussed with EP. Ok to restart eliquis tonight.  3. 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 -Echo 06/2017: EF 25-30%, mild LAE, mild/mod RV dilation, mod/severe RA. -PYP scan strongly suggestive of TTR amyloid, SPEP with 0.8% m-spike, UPEP with Bence Jones Protein positive, kappa type. 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 - Dr Marin Olp saw and thinks he does not have AL amyloid (just multiple myeloma) and that he has TTR amyloid and should be treated with tafamidis before chemo. Tafamidis started on 8/2.  - Can likely resume po torsemide.  - CO-OX 74%. Continue milrinone 0.125 mcg. Has home milrinone followed by [facility] - Continue tafamidis 80 mg daily - No  ARB/entresto/spirowith for now with CKD - No BB with low output/CHB - Renal function stable.  4. AKI on CKD III, baseline (1.4 - 1.6 11/2017) - Creatinine 1.76.  -Stable on torsemide.  5. [place] with Dr. Marin Olp  -Started on Bortezomib7/18/18andRevlimid7/23/19. - Dr Marin Olp thinks he does not have AL amyloid and that he does have TTR amyloid and should be treated with tafamodis before chemo. Now on Tafamidis.  - Have encouraged follow up with Dr Marin Olp. No change.  6. NASH with esophageal varices.  - [facility]/2016[facility] surveillance of Esophageal varices, Barrett's esophagus, and PMHx of Colon polyps. Showed two columns of small esophageal varices w/out stigmata, short segment Barrett's esophagus (without biopsies, due to presence of EVs), 5cm hiatal hernia, and hyperplastic small gastric nodules. - Restart Eliquis.   -  No change to current plan.   7. H/o GI bleeds/Anemia - Hgb stable on admit. No overt bleeding.  8. Hypokalemia - K 3.5   Home today with home milrinone from [facility] at previous dose. Will add PT. Ok to resume eliquis tonight.   Will make HF follow up.   Length of Stay: 3  Amy Clegg, NP  01/09/2018, 8:42 AM  Advanced Heart Failure Team Pager [phone] (M-F; 7a - 4p)  Please contact [facility] for night-coverage after hours (4p -7a ) and weekends on [email or web address]   Patient seen and examined with the above-signed Advanced Practice Provider and/or Housestaff. I personally reviewed laboratory data, imaging studies and relevant notes. I independently examined the patient and formulated the important aspects of the plan. I have edited the note to reflect any of my changes or salient points. I have personally discussed the plan with the patient and/or family.  Feels much better after PPM placed. Site looks ok. Will continue milrinone. Restart Eliquis. Plan d/c today with close f/u in HF Clinic.   Glori Bickers, MD    3:30 PM

## 2018-01-09 NOTE — Discharge Instructions (Addendum)
Biventricular Pacemaker Implantation A biventricular pacemaker implantation is a procedure to place (implant) a pacemaker into both of the lower chambers (ventricles) of the heart. A pacemaker is a small, battery-powered device that helps control the heartbeat. If the heart beats irregularly or too slowly (bradycardia), the pacemaker will pace the heart so that it beats at a normal rate or a programmed rate. The parts of a biventricular pacemaker include:  The pulse generator. The pulse generator contains a small computer and a memory system that is programmed to keep the heart beating at a certain rate. The pulse generator also produces the electrical signal that triggers the heart to beat. This is implanted under the skin of the upper chest, near the collarbone.  Wires (leads). The leads are placed in the left and right ventricles of the heart. The leads are connected to the pulse generator. They transmit electrical pulses from the pulse generator to the heart.  This procedure may be done to treat:  Bradycardia.  Symptoms of severe heart failure, such as shortness of breath (dyspnea).  Loss of consciousness that happens repeatedly (syncope) because of an irregular heart rate.  Tell a health care provider about:  Any allergies you have.  All medicines you are taking, including vitamins, herbs, eye drops, creams, and over-the-counter medicines.  Any problems you or family members have had with anesthetic medicines.  Any blood disorders you have.  Any surgeries you have had.  Any medical conditions you have.  Whether you are pregnant or may be pregnant. What are the risks? Generally, this is a safe procedure. However, problems may occur, including:  Infection.  Bleeding.  Allergic reactions to medicines or dyes.  Damage to other structures or organs, such as your blood vessels, lungs, or heart.  Failure of the pacemaker to improve your condition.  What happens before the  procedure?  Ask your health care provider about: ? Changing or stopping your regular medicines. This is especially important if you are taking diabetes medicines or blood thinners. ? Taking medicines such as aspirin and ibuprofen. These medicines can thin your blood. Do not take these medicines before your procedure if your health care provider instructs you not to.  Follow instructions from your health care provider about eating or drinking restrictions.  Do not use any tobacco products for at least 24 hours before your procedure. This includes cigarettes, chewing tobacco, or e-cigarettes.  Ask your health care provider how your surgical site will be marked or identified.  You may be given antibiotic medicine to help prevent infection.  You may have tests, including: ? Blood tests. ? Chest X-rays.  Plan to have someone take you home after the procedure.  If you go home right after the procedure, plan to have someone with you for 24 hours. What happens during the procedure?  To reduce your risk of infection: ? Your health care team will wash or sanitize their hands. ? Your skin will be washed with soap. ? Hair may be removed from your surgical area.  An IV tube will be inserted into one of your veins.  You will be given one or more of the following: ? A medicine to help you relax (sedative). ? A medicine to make you fall asleep (general anesthetic). ? A medicine that is injected into your spine to numb the area below and slightly above the injection site (spinal anesthetic). ? A medicine that is injected into an area of your body to numb everything below the  injection site (regional anesthetic).  An incision will be made in your upper chest, near your heart.  The leads will be guided into your incision, through your blood vessels, and into your ventricles. Your surgeon will use an X-ray machine (fluoroscope) to guide the leads into your heart.  The leads will be attached to  your heart muscles and to the pulse generator.  The leads will be tested to make sure that they work correctly.  The pulse generator will be implanted under your skin, near your incision.  Your incision will be closed with stitches (sutures), skin glue, or adhesive tape.  A bandage (dressing) will be placed over your incision. The procedure may vary among health care providers and hospitals. What happens after the procedure?  Your blood pressure, heart rate, breathing rate, and blood oxygen level will be monitored often until the medicines you were given have worn off.  You may continue to receive fluids and medicines through an IV tube.  You will have some pain. Pain medicines will be available to help you.  You will have a chest X-ray done. This is to make sure that your pacemaker is in the right place.  You may have to wear compression stockings. These stockings help to prevent blood clots and reduce swelling in your legs.  You will be given a pacemaker identification card. This card lists the implant date, device model, and manufacturer of your pacemaker.  Do not drive for 24 hours if you received a sedative. This information is not intended to replace advice given to you by your health care provider. Make sure you discuss any questions you have with your health care provider. Document Released: 12/25/2011 Document Revised: 09/07/2015 Document Reviewed: 12/25/2014 Elsevier Interactive Patient Education  2018 [place].     Biventricular Pacemaker Implantation, Care After Refer to this sheet in the next few weeks. These instructions provide you with information about caring for yourself after your procedure. Your health care provider may also give you more specific instructions. Your treatment has been planned according to current medical practices, but problems sometimes occur. Call your health care provider if you have any problems or questions after your procedure. What  can I expect after the procedure? After the procedure, it is common to have:  Mild pain or soreness in your chest for several days.  A small amount of blood or clear fluid coming from your incision.  A slight bump in your chest where the pulse generator was placed. You may be able to feel the generator under your skin. This is normal.  Follow these instructions at home: Medicines  Take over-the-counter and prescription medicines only as told by your health care provider.  Do not take any new medicines without asking your health care provider first.  If you were prescribed an antibiotic medicine, take it as told by your health care provider. Do not stop taking the antibiotic even if you start to feel better. Incision care   Keep your incision area clean and dry.  Follow instructions from your health care provider about how to take care of your incision. Make sure you: ? Wash your hands with soap and water before you change your bandage (dressing). If soap and water are not available, use hand sanitizer. ? Change your dressing as told by your health care provider. ? Leave stitches (sutures), skin glue, or adhesive strips in place. These skin closures may need to stay in place for 2 weeks or longer. If adhesive strip  edges start to loosen and curl up, you may trim the loose edges. Do not remove adhesive strips completely unless your health care provider tells you to do that.  Check your incision area every day for signs of infection. Check for: ? More redness, swelling, or pain. ? More fluid or blood. ? Warmth. ? Pus or a bad smell. Activity  Return to your normal activities as told by your health care provider. Ask your health care provider what activities are safe for you.  Do not lift anything that is heavier than 10 lb (4.5 kg) until your health care provider approves.  Do not lift your upper arms above your shoulders for at least 6 weeks or as long as told by your health care  provider. ? If you sleep with your arms above your head, wear an arm restraint while you sleep to prevent this from happening. ? Avoid sudden movements that pull your upper arms far away from your body for at least 6 weeks.  Do a mild form of exercise at least once a day. As you feel better, you may exercise more.  Gently stretch your shoulders at least once a day to help prevent stiffness in your chest. Electricity and Magnetic Fields  Avoid places and objects that have a strong electric or magnetic field. This includes: ? Airport Data processing manager. When you are at the airport, tell officials that you have a pacemaker and show them your pacemaker identification card. Officials will check you in safely so that your pacemaker is not damaged. Do not allow magnetic wands to be waved near your pacemaker. That can make the pacemaker stop working. ? Metal detectors. If you must pass through a metal detector, walk through it quickly. Do not stop under the detector or stand near it. ? Power plants. ? Large electrical generators. ? Radiofrequency transmission towers, such as cell phone and radio towers.  Do not use amateur ("ham") radio equipment or electric ("arc") Clinical cytogeneticist. If you are not sure whether something is safe to use, ask your health care provider. ? Some devices may be safe to use if you hold them at least 1 ft (0.3 m) from your pacemaker. These devices may include power tools, lawn mowers, and speakers.  When you talk on your cell phone, hold it to your ear that is opposite from the side that your pacemaker is on. Do not leave your cell phone in a pocket over your pacemaker. Long-Term Care  Carry your pacemaker identification card with you at all times, especially when you travel.  Consider wearing a medical alert bracelet or necklace that explains your pacemaker and any heart conditions you have.  Tell all health care providers who care for you that you have a pacemaker. This  may prevent you from having an MRI because of the strong magnets used during that test.  Have your pacemaker checked every 3-6 months or as often as told by your health care provider. General instructions  Do not use any tobacco products, such as cigarettes, chewing tobacco, or e-cigarettes. If you need help quitting, ask your health care provider.  Do not drive or operate heavy machinery while taking prescription pain medicine.  Do not take baths, swim, or use a hot tub until your health care provider approves.  Follow instructions from your health care provider about eating or drinking restrictions.  Weigh yourself every day and write down your weight.  Keep all follow-up visits as told by your health care provider.  This is important. Contact a health care provider if:  You suddenly gain 3 lb (1.4 kg) or more in 24 hours.  You have swelling in your feet or legs.  You have an irregular heartbeat (palpitations).  You have more redness, swelling, or pain around your incision.  You have more fluid or blood coming from your incision.  Your incision area feels warm to the touch.  You have pus or a bad smell coming from your incision. Get help right away if:  You have chest pain.  You have difficulty breathing.  You suddenly feel light-headed.  You have a fever.  You faint. These symptoms may represent a serious problem that is an emergency. Do not wait to see if the symptoms will go away. Get medical help right away. Call your local emergency services (911 in the [place] Do not drive yourself to the hospital. This information is not intended to replace advice given to you by your health care provider. Make sure you discuss any questions you have with your health care provider. Document Released: 12/25/2011 Document Revised: 09/07/2015 Document Reviewed: 12/25/2014 Elsevier Interactive Patient Education  2018 [place] Discharge Instructions for   Pacemaker/Defibrillator Patients  Activity No heavy lifting or vigorous activity with your left/right arm for 6 to 8 weeks.  Do not raise your left/right arm above your head for one week.  Gradually raise your affected arm as drawn below.             01/12/18                     01/13/18                    01/14/18                    01/15/18 __  NO DRIVING until cleared to at your wound check visit  [place] the wound area clean and dry.  Do not get this area wet, no showers until cleared to at your wound check visit - The tape/steri-strips on your wound will fall off; do not pull them off.  No bandage is needed on the site.  DO  NOT apply any creams, oils, or ointments to the wound area. - If you notice any drainage or discharge from the wound, any swelling or bruising at the site, or you develop a fever > 101? F after you are discharged home, call the office at once.  Special Instructions - You are still able to use cellular telephones; use the ear opposite the side where you have your pacemaker/defibrillator.  Avoid carrying your cellular phone near your device. - When traveling through airports, show security personnel your identification card to avoid being screened in the metal detectors.  Ask the security personnel to use the hand wand. - Avoid arc welding equipment, MRI testing (magnetic resonance imaging), TENS units (transcutaneous nerve stimulators).  Call the office for questions about other devices. - Avoid electrical appliances that are in poor condition or are not properly grounded. - Microwave ovens are safe to be near or to operate.  Additional information for defibrillator patients should your device go off: - If your device goes off ONCE and you feel fine afterward, notify the device clinic nurses. - If your device goes off ONCE and you do not feel well afterward, call 911. - If your device goes off TWICE, call 911. - If your device goes  off THREE times in one  day, call 911.  DO NOT DRIVE YOURSELF OR A FAMILY MEMBER WITH A DEFIBRILLATOR TO THE HOSPITAL--CALL 911.  Information on my medicine - ELIQUIS (apixaban)  This medication education was reviewed with me or my healthcare representative as part of my discharge preparation.  The pharmacist that spoke with me during my hospital stay was:  Einar Grad, [facility]  Why was Eliquis prescribed for you? Eliquis was prescribed for you to reduce the risk of a blood clot forming that can cause a stroke if you have a medical condition called atrial fibrillation (a type of irregular heartbeat).  What do You need to know about Eliquis ? Take your Eliquis TWICE DAILY - one tablet in the morning and one tablet in the evening with or without food. If you have difficulty swallowing the tablet whole please discuss with your pharmacist how to take the medication safely.  Take Eliquis exactly as prescribed by your doctor and DO NOT stop taking Eliquis without talking to the doctor who prescribed the medication.  Stopping may increase your risk of developing a stroke.  Refill your prescription before you run out.  After discharge, you should have regular check-up appointments with your healthcare provider that is prescribing your Eliquis.  In the future your dose may need to be changed if your kidney function or weight changes by a significant amount or as you get older.  What do you do if you miss a dose? If you miss a dose, take it as soon as you remember on the same day and resume taking twice daily.  Do not take more than one dose of ELIQUIS at the same time to make up a missed dose.  Important Safety Information A possible side effect of Eliquis is bleeding. You should call your healthcare provider right away if you experience any of the following: ? Bleeding from an injury or your nose that does not stop. ? Unusual colored urine (red or dark brown) or unusual colored stools (red or black). ? Unusual  bruising for unknown reasons. ? A serious fall or if you hit your head (even if there is no bleeding).  Some medicines may interact with Eliquis and might increase your risk of bleeding or clotting while on Eliquis. To help avoid this, consult your healthcare provider or pharmacist prior to using any new prescription or non-prescription medications, including herbals, vitamins, non-steroidal anti-inflammatory drugs (NSAIDs) and supplements.  This website has more information on Eliquis (apixaban): http://www.eliquis.com/eliquis/home

## 2018-01-09 NOTE — Care Management Note (Signed)
Case Management Note Previous Note Created by Venita Sheffield  Patient Details  Name: Dillon Andrews MRN: [id number] Date of Birth: 02-05-1941  Subjective/Objective:                  Syncope  Action/Plan: ED CM spoke with the patient and his daughter, [name], at the bedside. Patient states he uses a Rolator walker at home but is able to walk without his walker. He walks "laps" inside his home using his walker. Patient lives at home with his wife. Patient reports no falls. His daughter lives across the street from his home. Patient states he has a BP cuff, glucometer (boarderline diabetic per daughter) and pulse oximeter. Patient reports he is pleased with the care he receives. He is currently active with [facility] RN visits weekly) for home milrinone management. Heather at [facility] notified of the patient's admission. Unit CM to continue to follow for discharge needs.   Expected Discharge Date:  09/27/19unknown               Expected Discharge Plan:  [place]  In-House Referral:     Discharge planning Services     Post Acute Care Choice:  Resumption of Svcs/PTA Provider Choice offered to:     DME Arranged:    DME Agency:     HH Arranged:    [place] Agency:     Status of Service:  In process, will continue to follow  If discussed at Long Length of Stay Meetings, dates discussed:    Additional Comments: 01/09/2018 Pt to discharge home today with the resumption of IV milrinone in the home - [facility] will resume and are aware of discharge home today Maryclare Labrador, RN 01/09/2018, 1:29 PM

## 2018-01-09 NOTE — Plan of Care (Signed)
  Problem: Health Behavior/Discharge Planning: Goal: Ability to manage health-related needs will improve Outcome: Adequate for Discharge   Problem: Clinical Measurements: Goal: Diagnostic test results will improve Outcome: Adequate for Discharge   Problem: Activity: Goal: Risk for activity intolerance will decrease Outcome: Adequate for Discharge   Problem: Coping: Goal: Level of anxiety will decrease Outcome: Adequate for Discharge   Problem: Safety: Goal: Ability to remain free from injury will improve Outcome: Adequate for Discharge   Problem: Cardiac: Goal: Ability to achieve and maintain adequate cardiopulmonary perfusion will improve Outcome: Adequate for Discharge

## 2018-01-10 DIAGNOSIS — I429 Cardiomyopathy, unspecified: Secondary | ICD-10-CM | POA: Diagnosis not present

## 2018-01-10 DIAGNOSIS — I4891 Unspecified atrial fibrillation: Secondary | ICD-10-CM | POA: Diagnosis not present

## 2018-01-10 DIAGNOSIS — E1122 Type 2 diabetes mellitus with diabetic chronic kidney disease: Secondary | ICD-10-CM | POA: Diagnosis not present

## 2018-01-10 DIAGNOSIS — I5043 Acute on chronic combined systolic (congestive) and diastolic (congestive) heart failure: Secondary | ICD-10-CM | POA: Diagnosis not present

## 2018-01-10 DIAGNOSIS — N183 Chronic kidney disease, stage 3 (moderate): Secondary | ICD-10-CM | POA: Diagnosis not present

## 2018-01-10 DIAGNOSIS — C9000 Multiple myeloma not having achieved remission: Secondary | ICD-10-CM | POA: Diagnosis not present

## 2018-01-10 DIAGNOSIS — I130 Hypertensive heart and chronic kidney disease with heart failure and stage 1 through stage 4 chronic kidney disease, or unspecified chronic kidney disease: Secondary | ICD-10-CM | POA: Diagnosis not present

## 2018-01-10 DIAGNOSIS — D631 Anemia in chronic kidney disease: Secondary | ICD-10-CM | POA: Diagnosis not present

## 2018-01-10 DIAGNOSIS — K7460 Unspecified cirrhosis of liver: Secondary | ICD-10-CM | POA: Diagnosis not present

## 2018-01-12 ENCOUNTER — Ambulatory Visit: Payer: Medicare HMO | Admitting: Nurse Practitioner

## 2018-01-12 ENCOUNTER — Ambulatory Visit: Payer: Medicare HMO | Admitting: Family Medicine

## 2018-01-12 ENCOUNTER — Telehealth (HOSPITAL_COMMUNITY): Payer: Self-pay | Admitting: *Deleted

## 2018-01-12 ENCOUNTER — Ambulatory Visit: Payer: Self-pay | Admitting: *Deleted

## 2018-01-12 NOTE — Telephone Encounter (Signed)
Pt's daughter calling to report pt constipated and hasn't voided. States last voided this am at 0800. Pt's input approx. 500cc in 6 hours. Asked to speak to patient who was present during call. Pt states his last normal BM was Saturday; normal pattern QD. Concerned as he did not have BM yesterday. Reports mild abdominal cramping.  States did void small amount few minutes ago. Pt states does not feel urge to void, no bladder distention. Then states "I feel like my bowels might move now." States wife gave him a glycerin suppository, Miralax at noon.  NT will CB for further assessment.  CB at 1500: Pt's daughter reports pt passed "3 hard balls" after suppository. Also reports pt vomited; light brown with undigested food.  Daughter reports pt has external hemorrhoids which are present now, states "They bleed easily and bad with clots while in hospital." Has not voided as of yet. Also reported 2.2 lb weight gain past 2 days.  NT called practice and spoke with Tonya. Appt made for today with C. Nche. Care advise given per protocol.   Reason for Disposition . Rectal pain . [1] Rectal pain or fullness from fecal impaction (rectum full of stool) AND [2] NOT better after SITZ bath, suppository or enema  Answer Assessment - Initial Assessment Questions 1. STOOL PATTERN OR FREQUENCY: "How often do you pass bowel movements (BMs)?"  (Normal range: tid to q 3 days)  "When was the last BM passed?"       QD 2. STRAINING: "Do you have to strain to have a BM?"     Yes 3. RECTAL PAIN: "Does your rectum hurt when the stool comes out?" If so, ask: "Do you have hemorrhoids? How bad is the pain?"  (Scale 1-10; or mild, moderate, severe)    Yes, hemorrhoids. 4. STOOL COMPOSITION: "Are the stools hard?"     yes 5. BLOOD ON STOOLS: "Has there been any blood on the toilet tissue or on the surface of the BM?" If so, ask: "When was the last time?"     unsure 6. CHRONIC CONSTIPATION: "Is this a new problem for you?"  If no,  ask: "How long have you had this problem?" (days, weeks, months)       7. CHANGES IN DIET: "Have there been any recent changes in your diet?"       8. MEDICATIONS: "Have you been taking any new medications?"      9. LAXATIVES: "Have you been using any laxatives or enemas?"  If yes, ask "What, how often, and when was the last time?"     Suppository prior to call, Miralax at noon 10. CAUSE: "What do you think is causing the constipation?"         11. OTHER SYMPTOMS: "Do you have any other symptoms?" (e.g., abdominal pain, fever, vomiting)       Mild cramping  Protocols used: CONSTIPATION-A-AH

## 2018-01-12 NOTE — Telephone Encounter (Signed)
FYI

## 2018-01-12 NOTE — Telephone Encounter (Signed)
Pts daughter called to report that patient is constipated and has not had a bowel movement since yesterday. Patient was given miralax and that did not help. Pts daughter has contacted primary care.

## 2018-01-15 ENCOUNTER — Ambulatory Visit (HOSPITAL_COMMUNITY)
Admit: 2018-01-15 | Discharge: 2018-01-15 | Disposition: A | Discharge status: Home / Self Care | Payer: Medicare HMO | Source: Ambulatory Visit | Attending: Internal Medicine | Admitting: Internal Medicine

## 2018-01-15 VITALS — BP 126/72 | HR 87 | Wt 181.0 lb

## 2018-01-15 DIAGNOSIS — Z8379 Family history of other diseases of the digestive system: Secondary | ICD-10-CM | POA: Diagnosis not present

## 2018-01-15 DIAGNOSIS — I130 Hypertensive heart and chronic kidney disease with heart failure and stage 1 through stage 4 chronic kidney disease, or unspecified chronic kidney disease: Secondary | ICD-10-CM | POA: Insufficient documentation

## 2018-01-15 DIAGNOSIS — Z85828 Personal history of other malignant neoplasm of skin: Secondary | ICD-10-CM | POA: Diagnosis not present

## 2018-01-15 DIAGNOSIS — E8582 Wild-type transthyretin-related (ATTR) amyloidosis: Secondary | ICD-10-CM | POA: Insufficient documentation

## 2018-01-15 DIAGNOSIS — Z87891 Personal history of nicotine dependence: Secondary | ICD-10-CM | POA: Diagnosis not present

## 2018-01-15 DIAGNOSIS — K7581 Nonalcoholic steatohepatitis (NASH): Secondary | ICD-10-CM | POA: Insufficient documentation

## 2018-01-15 DIAGNOSIS — Z9049 Acquired absence of other specified parts of digestive tract: Secondary | ICD-10-CM | POA: Insufficient documentation

## 2018-01-15 DIAGNOSIS — R55 Syncope and collapse: Secondary | ICD-10-CM | POA: Insufficient documentation

## 2018-01-15 DIAGNOSIS — E785 Hyperlipidemia, unspecified: Secondary | ICD-10-CM | POA: Insufficient documentation

## 2018-01-15 DIAGNOSIS — Z811 Family history of alcohol abuse and dependence: Secondary | ICD-10-CM | POA: Insufficient documentation

## 2018-01-15 DIAGNOSIS — Z66 Do not resuscitate: Secondary | ICD-10-CM | POA: Insufficient documentation

## 2018-01-15 DIAGNOSIS — I4891 Unspecified atrial fibrillation: Secondary | ICD-10-CM | POA: Diagnosis not present

## 2018-01-15 DIAGNOSIS — Z8249 Family history of ischemic heart disease and other diseases of the circulatory system: Secondary | ICD-10-CM | POA: Diagnosis not present

## 2018-01-15 DIAGNOSIS — I43 Cardiomyopathy in diseases classified elsewhere: Secondary | ICD-10-CM | POA: Diagnosis not present

## 2018-01-15 DIAGNOSIS — E1122 Type 2 diabetes mellitus with diabetic chronic kidney disease: Secondary | ICD-10-CM | POA: Diagnosis not present

## 2018-01-15 DIAGNOSIS — Z950 Presence of cardiac pacemaker: Secondary | ICD-10-CM | POA: Diagnosis not present

## 2018-01-15 DIAGNOSIS — E119 Type 2 diabetes mellitus without complications: Secondary | ICD-10-CM | POA: Diagnosis not present

## 2018-01-15 DIAGNOSIS — E854 Organ-limited amyloidosis: Secondary | ICD-10-CM | POA: Diagnosis not present

## 2018-01-15 DIAGNOSIS — N183 Chronic kidney disease, stage 3 unspecified: Secondary | ICD-10-CM

## 2018-01-15 DIAGNOSIS — Z803 Family history of malignant neoplasm of breast: Secondary | ICD-10-CM | POA: Insufficient documentation

## 2018-01-15 DIAGNOSIS — I8500 Esophageal varices without bleeding: Secondary | ICD-10-CM | POA: Insufficient documentation

## 2018-01-15 DIAGNOSIS — D631 Anemia in chronic kidney disease: Secondary | ICD-10-CM | POA: Diagnosis not present

## 2018-01-15 DIAGNOSIS — M1990 Unspecified osteoarthritis, unspecified site: Secondary | ICD-10-CM | POA: Insufficient documentation

## 2018-01-15 DIAGNOSIS — C9000 Multiple myeloma not having achieved remission: Secondary | ICD-10-CM | POA: Insufficient documentation

## 2018-01-15 DIAGNOSIS — I442 Atrioventricular block, complete: Secondary | ICD-10-CM | POA: Diagnosis not present

## 2018-01-15 DIAGNOSIS — I428 Other cardiomyopathies: Secondary | ICD-10-CM | POA: Insufficient documentation

## 2018-01-15 DIAGNOSIS — Z7901 Long term (current) use of anticoagulants: Secondary | ICD-10-CM | POA: Insufficient documentation

## 2018-01-15 DIAGNOSIS — I429 Cardiomyopathy, unspecified: Secondary | ICD-10-CM | POA: Diagnosis not present

## 2018-01-15 DIAGNOSIS — Z79899 Other long term (current) drug therapy: Secondary | ICD-10-CM | POA: Diagnosis not present

## 2018-01-15 DIAGNOSIS — K219 Gastro-esophageal reflux disease without esophagitis: Secondary | ICD-10-CM | POA: Insufficient documentation

## 2018-01-15 DIAGNOSIS — I5042 Chronic combined systolic (congestive) and diastolic (congestive) heart failure: Secondary | ICD-10-CM | POA: Diagnosis not present

## 2018-01-15 DIAGNOSIS — I5043 Acute on chronic combined systolic (congestive) and diastolic (congestive) heart failure: Secondary | ICD-10-CM | POA: Diagnosis not present

## 2018-01-15 DIAGNOSIS — K7460 Unspecified cirrhosis of liver: Secondary | ICD-10-CM | POA: Diagnosis not present

## 2018-01-15 DIAGNOSIS — R001 Bradycardia, unspecified: Secondary | ICD-10-CM | POA: Diagnosis not present

## 2018-01-15 NOTE — Patient Instructions (Signed)
Keep your follow up as scheduled.  Please elevate Left upper extremity and refrain from using ice therapy   Do the following things EVERYDAY: 1) Weigh yourself in the morning before breakfast. Write it down and keep it in a log. 2) Take your medicines as prescribed 3) Eat low salt foods-Limit salt (sodium) to 2000 mg per day.  4) Stay as active as you can everyday 5) Limit all fluids for the day to less than 2 liters

## 2018-01-15 NOTE — Progress Notes (Signed)
Advanced Heart Failure Clinic Note   Referring Physician: PCP: [name], MD PCP-Cardiologist: Glenetta Hew, MD  Primary HF: Dr. [name]   HPI: Dillon Andrews is a 77 y.o. malewith a hx of chronic combined CHF due to NICM, CKD III,presumedNASH(per pt, never proven),esophageal varices per chart(prior glass of wine occasionally but no heavy alcohol),HTN, HLD, DM, Barrett's esophagus, GERD, anemia, RBBB  He was admitted 3/13-3/17/19 for A/C combined HF. While inpatient, SPEP and UPEP sent for concerns for cardiac amyloidosis. He diuresed with IV lasix and was transitioned to lasix 80 mg q am, 40 mg q pm. DC weight 180 lbs.   NICM by cath at [facility] 09/2016 with EF 40-45%. Echo 06/2017: EF 25-30%,NICM by cath at [facility] 09/2016 with EF 40-45%. Echo 06/2017: EF 25-30%,  His SPEP came back with m-spike 0.8% and he was referred to Dr Beryle Beams. UPEP with Bence Jones Protein positive; kappa type.   PYP scan completed on 3/20 strongly suggestive of transthyretin amyloidosis. Genetic testing negative.  Seen in clinic on 3/26 and transitioned from lasix to torsemide. Admitted 3/28-3/31 with URI and A/C HF. URI treated with antibiotics. Diuresed with IV lasix, then transitioned to torsemide 40 mg BID. Genetic testing sent.   Admitted 2/87-6/81/15 with A/C systolic HF. He was diuresed with IV lasix. He required milrinone for low output. He was in afib RVR and was cardioverted twice. He was treated for sepsis. He had AKI on CKD that resolved prior to discharge. He also had rectal bleeding and required 1 unit RBCs. Eliquis was held and then dose was reduced. Palliative met with him and he was made DNR/I. Discharged home with milrinone.   Admitted 8/20-8/22/19 with fever and presumed HCAP. PCT was negative. He did not need abx at discharge. HF team consulted. He was stable from HF perspective. Continued milrinone, torsemide, and tafamidis. He was back in rate controlled atrial flutter, but  was asymptomatic. DC weight 174 lbs.   Admitted 9/24 after he had syncopal event.  CT of head was negative for bleed. EKG showed complete heart block. Amiodarone was stopped. EP consulted and on 9/26 he underwent Medtronic PPM. Eliquis was restarted on 01/09/2018.  Discharge weight 175 pounds.   Today he returns for post hospital follow up. Overall feeling fine. Feeling stronger every day. Complaining of left arm swelling and soreness around his device.  Denies SOB/PND/Orthopnea. Appetite improving. No fever or chills. Weight at home 176-179 pounds. Taking all medications. No bleeding problems. [facility] following for home milrinone.      Review of systems complete and found to be negative unless listed in HPI.   Past Medical History:  Diagnosis Date  . Anemia 11/2016  . Arthritis    "some; hands/fingers" (06/25/2017)  . Atrial tachycardia ([place])   . Barrett's esophagus   . Basal cell carcinoma    "face, arms, hands" (06/25/2017)  . Chicken pox   . Cholelithiasis 11/2016   S/P LAP CHOLE  . Chronic combined systolic and diastolic CHF (congestive heart failure) ([place])   . CKD (chronic kidney disease), stage III ([place])   . Esophageal varices (HCC)    Secondary to NAFLD  . Family history of adverse reaction to anesthesia    "daughter's sat drop very low; last OR had to be reintubated" (06/25/2017)  . GERD (gastroesophageal reflux disease)   . Goals of care, counseling/discussion 10/22/2017  . High cholesterol   . Hypertension   . Kappa light chain myeloma ([place]) 10/22/2017  . Mitral regurgitation   .  NAFLD (nonalcoholic fatty liver disease)   . NICM (nonischemic cardiomyopathy) ([place])    Echo 06/2017: Severe LVH.  EF 25 to 30% with mild HK of the baseline after septal wall.  Moderate to severely dilated right atrium, moderately dilated RV. (Concern fr ? amyloidosis).  . NSVT (nonsustained ventricular tachycardia) ([place])   . Pneumonia 09/2016  . RBBB   . Tricuspid regurgitation   . Type II diabetes  mellitus ([place])     Current Outpatient Medications  Medication Sig Dispense Refill  . apixaban (ELIQUIS) 2.5 MG TABS tablet Take 1 tablet (2.5 mg total) by mouth 2 (two) times daily. 60 tablet 11  . dronabinol (MARINOL) 5 MG capsule Take 1 capsule (5 mg total) by mouth 2 (two) times daily before lunch and supper. 60 capsule 2  . gabapentin (NEURONTIN) 800 MG tablet TAKE 1 TABLET BY MOUTH AT BEDTIME (Patient taking differently: Take 400 mg by mouth at bedtime. ) 90 tablet 1  . glucose blood test strip Use as instructed 100 each 12  . hydrocortisone (ANUSOL-HC) 25 MG suppository Place 1 suppository (25 mg total) rectally 2 (two) times daily. (Patient taking differently: Place 25 mg rectally 2 (two) times daily as needed for hemorrhoids. ) 12 suppository 0  . Melatonin 5 MG TABS Take 5 mg by mouth at bedtime.     . milrinone (PRIMACOR) 20 MG/100 ML SOLN infusion Inject 0.0099 mg/min into the vein continuous. 100 mL 0  . omeprazole (PRILOSEC) 40 MG capsule Take 1 capsule (40 mg total) by mouth daily. 90 capsule 1  . ondansetron (ZOFRAN) 4 MG tablet Take 1 tablet (4 mg total) by mouth every 8 (eight) hours as needed for nausea or vomiting. 30 tablet 2  . potassium chloride SA (K-DUR,KLOR-CON) 20 MEQ tablet Take 20 mEq by mouth 2 (two) times daily.    . Probiotic Product (PROBIOTIC DAILY PO) Take 1 tablet by mouth daily.     . rosuvastatin (CRESTOR) 10 MG tablet TAKE 1 TABLET BY MOUTH ONCE DAILY (Patient taking differently: Take 10 mg by mouth daily. ) 90 tablet 0  . senna-docusate (SENOKOT-S) 8.6-50 MG tablet Take 2 tablets by mouth 2 (two) times daily. (Patient taking differently: Take 1 tablet by mouth See admin instructions. Take one tablet by mouth every morning, may take another tablet in the evening as needed for constipation) 20 tablet 0  . sertraline (ZOLOFT) 25 MG tablet TAKE 1 TABLET BY MOUTH EVERY DAY (Patient taking differently: Take 25 mg by mouth daily. ) 90 tablet 0  . torsemide (DEMADEX)  20 MG tablet Take 1 tablet (20 mg total) by mouth daily. Can take 20 mg extra as needed. (Patient taking differently: Take 20 mg by mouth See admin instructions. Take one tablet (20 mg) by mouth every morning, may also take one tablet in the afternoon as needed for swelling) 40 tablet 3  . traMADol (ULTRAM) 50 MG tablet Take 1 tablet (50 mg total) by mouth every 6 (six) hours as needed for moderate pain. 30 tablet 0  . traZODone (DESYREL) 50 MG tablet TAKE 1 TABLET(50 MG) BY MOUTH AT BEDTIME (Patient taking differently: Take 50 mg by mouth at bedtime. ) 90 tablet 0  . vitamin B-12 (CYANOCOBALAMIN) 500 MCG tablet Take 500 mcg by mouth every other day.    Marland Kitchen VYNDAQEL 20 MG CAPS Take 80 mg by mouth daily. (4 capsules) (Patient taking differently: Take 80 mg by mouth every evening. ) 120 capsule   . zolpidem (AMBIEN) 5  MG tablet Take 1 tablet (5 mg total) by mouth at bedtime. 30 tablet 2  . polyethylene glycol (MIRALAX / GLYCOLAX) packet Take 17 g by mouth daily. (Patient not taking: Reported on 01/15/2018) 14 each 0   No current facility-administered medications for this encounter.     No Known Allergies    Social History   Socioeconomic History  . Marital status: Married    Spouse name: Not on file  . Number of children: 4  . Years of education: 73  . Highest education level: Not on file  Occupational History  . Occupation: retired  Scientific laboratory technician  . Financial resource strain: Not on file  . Food insecurity:    Worry: Not on file    Inability: Not on file  . Transportation needs:    Medical: Not on file    Non-medical: Not on file  Tobacco Use  . Smoking status: Former Smoker    Packs/day: 0.30    Years: 15.00    Pack years: 4.50    Types: Cigarettes    Last attempt to quit: 03/17/1977    Years since quitting: 40.8  . Smokeless tobacco: Never Used  Substance and Sexual Activity  . Alcohol use: Yes    Comment: 06/25/2017 "nothing since 09/2016;  used to have glass of wine q hs"  .  Drug use: No  . Sexual activity: Not on file  Lifestyle  . Physical activity:    Days per week: Not on file    Minutes per session: Not on file  . Stress: Not on file  Relationships  . Social connections:    Talks on phone: Not on file    Gets together: Not on file    Attends religious service: Not on file    Active member of club or organization: Not on file    Attends meetings of clubs or organizations: Not on file    Relationship status: Not on file  . Intimate partner violence:    Fear of current or ex partner: Not on file    Emotionally abused: Not on file    Physically abused: Not on file    Forced sexual activity: Not on file  Other Topics Concern  . Not on file  Social History Narrative   He is retired from [employer].  Previously worked for [employer] by his daughter --> also a patient of Dr. Ellyn Hack   Lives with wife in a one story home.    Education: high school.       Family History  Problem Relation Age of Onset  . Heart failure Mother   . Cancer Father   . Cataracts Father   . Breast cancer Sister   . Alcohol abuse Brother   . Liver disease Brother   . Autoimmune disease Daughter        Bechet's syndrome  . Cirrhosis Neg Hx    Vitals:   01/15/18 1355  BP: 126/72  Pulse: 87  SpO2: 97%  Weight: 82.1 kg (181 lb)     Wt Readings from Last 3 Encounters:  01/15/18 82.1 kg (181 lb)  01/09/18 79.7 kg (175 lb 11.3 oz)  01/05/18 82.1 kg (181 lb)    PHYSICAL EXAM: General:  Well appearing. No resp difficulty HEENT: normal Neck: supple. no JVD. Carotids 2+ bilat; no bruits. No lymphadenopathy or thryomegaly appreciated. Cor: PMI nondisplaced. Regular rate & rhythm. No rubs, gallops or murmurs. R upper chest tunneled catheter. L upper  chest ICD with dressing intact and ecchymotic.  Lungs: clear Abdomen: soft, nontender, nondistended. No hepatosplenomegaly. No bruits or masses. Good bowel sounds. Extremities: no cyanosis, clubbing,  rash, edema. LUE 1+ edema and ecchymosis noted.  Neuro: alert & orientedx3, cranial nerves grossly intact. moves all 4 extremities w/o difficulty. Affect pleasant  EKG- A sensed V paced with PVC 64 bpm   ASSESSMENT & PLAN:   1. 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  - Echo 06/2017: EF 25-30%, mild LAE, mild/mod RV dilation, mod/severe RA. -PYP scan strongly suggestive of TTR amyloid, SPEP with 0.8% m-spike, UPEP with Bence Jones Protein positive, kappa type. 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 - Dr Marin Olp saw and thinks he does not have AL amyloid (just multiple myeloma) and that he has TTR amyloid and should be treated with tafamidis before chemo. Tafamidis started on 8/2.  - NYHA II.  - Volume status stable. Continue 20 mg torsemide daily with an extra 20 mg if needed.  - Continue milrinone 0.125 mcg. Managed by [facility] - Continue tafamidis 80 mg daily - No ARB/entresto/spiro with for now with CKD - SBP soft. Hold off on hydralazine/imdur  2. CKD III, baseline ~1.6 Had labs obtained by [facility] today.    3. Atrial Flutter s/p DCCV 7/31, 8/2, 12/19/2017 ->nowin junctional rhythm with symptomatic bradycardia  - Off amiodarone with CHB.    - Continue Eliquis 2.5 mg BID.  - 4. HL - Continue crestor.   5. [place] with Dr. Marin Olp  -Started on Bortezomib7/18/18andRevlimid7/23/19. - Dr Marin Olp thinks he does not have AL amyloid and that he does have TTR amyloid and should be treated with tafamodis before chemo. Now on Tafamidis.  - Encouraged them to follow up with Dr Marin Olp  6. NASH with esophageal varices.  - Last EGD3/8/2016at [facility] surveillance of Esophageal varices, Barrett's esophagus, and PMHx of Colon polyps. Showed two columns of small esophageal varices w/out stigmata, short segment Barrett's esophagus (without biopsies, due to presence of EVs), 5cm hiatal hernia, and  hyperplastic small gastric nodules.  - Continue low dose eliquis 2.5 mg BID for afib.  7. Hx of Rectal bleeding - EGD/Colon deferred with recent (2017) showing diverticulosis and hemorrhoids. - Denies bleeding.   8. Syncope,CHB S/P Medtronic PPM. LUE edema and ecchymosis noted. He has follow up with EP next week.  Discussed with Chanetta Marshall and she evaluated. Plan to elevate as much as he can.    Follow up in 3 weeks with Dr Donnamae Jude, NP 01/15/18

## 2018-01-16 ENCOUNTER — Ambulatory Visit (INDEPENDENT_AMBULATORY_CARE_PROVIDER_SITE_OTHER): Payer: Medicare HMO | Admitting: *Deleted

## 2018-01-16 ENCOUNTER — Telehealth (HOSPITAL_COMMUNITY): Payer: Self-pay | Admitting: Cardiology

## 2018-01-16 ENCOUNTER — Telehealth: Payer: Self-pay

## 2018-01-16 DIAGNOSIS — I442 Atrioventricular block, complete: Secondary | ICD-10-CM

## 2018-01-16 LAB — CUP PACEART INCLINIC DEVICE CHECK
Implantable Lead Implant Date: 20190926
Implantable Lead Implant Date: 20190926
Implantable Lead Location: 753859
Implantable Lead Location: 753860
Implantable Lead Model: 3830
Implantable Pulse Generator Implant Date: 20190926
MDC IDC SESS DTM: 20191004161801

## 2018-01-16 NOTE — Progress Notes (Signed)
Device check to evaluate RV lead function. RV threshold ran starting at 5.00V@1 .38m w/ 12 lead EKG LOC noted at 2.0V@1 .04m RV output set to 5.00V@1 .0086mPt educated to go to the ED over the weekend if he has syncope. Pt and pts daughter voiced understanding. ROV on 01/21/2018 for full device check and wound check.

## 2018-01-16 NOTE — Telephone Encounter (Signed)
LVM for pt to call device clinic ASAP regarding apt this afternoon to check pacemaker.

## 2018-01-16 NOTE — Telephone Encounter (Signed)
Patients daughter walked in with concerns regarding elevated b/p reading x 1 152/92  reports reading was right after getting dressed. Mild dizziness  With recent syncopal episode daughter is very concerned   Advised to only check b/p once a day Above reading is not critically elevated  Denied chest pains, denies SOB Advised is dizziness is severe, feels like he is about to pass out, CP he soul report to ER  If b/p continues to rise should call office may need to restart hydralazine

## 2018-01-21 ENCOUNTER — Ambulatory Visit (INDEPENDENT_AMBULATORY_CARE_PROVIDER_SITE_OTHER): Payer: Medicare HMO | Admitting: *Deleted

## 2018-01-21 ENCOUNTER — Ambulatory Visit
Admission: RE | Admit: 2018-01-21 | Discharge: 2018-01-21 | Disposition: A | Discharge status: Home / Self Care | Payer: Medicare HMO | Source: Ambulatory Visit | Attending: Cardiology | Admitting: Cardiology

## 2018-01-21 DIAGNOSIS — I442 Atrioventricular block, complete: Secondary | ICD-10-CM | POA: Diagnosis not present

## 2018-01-21 DIAGNOSIS — I4892 Unspecified atrial flutter: Secondary | ICD-10-CM | POA: Diagnosis not present

## 2018-01-21 DIAGNOSIS — J90 Pleural effusion, not elsewhere classified: Secondary | ICD-10-CM | POA: Diagnosis not present

## 2018-01-21 DIAGNOSIS — Z950 Presence of cardiac pacemaker: Secondary | ICD-10-CM

## 2018-01-21 DIAGNOSIS — J9811 Atelectasis: Secondary | ICD-10-CM | POA: Diagnosis not present

## 2018-01-21 LAB — CUP PACEART INCLINIC DEVICE CHECK
Battery Remaining Longevity: 45 mo
Battery Voltage: 3.17 V
Brady Statistic AS VP Percent: 90.22 %
Brady Statistic AS VS Percent: 0.5 %
Brady Statistic RA Percent Paced: 9.24 %
Brady Statistic RV Percent Paced: 99.46 %
Date Time Interrogation Session: 20191009152910
Implantable Lead Model: 3830
Implantable Lead Model: 5076
Implantable Pulse Generator Implant Date: 20190926
Lead Channel Impedance Value: 285 Ohm
Lead Channel Impedance Value: 323 Ohm
Lead Channel Impedance Value: 456 Ohm
Lead Channel Pacing Threshold Amplitude: 1.25 V
Lead Channel Pacing Threshold Pulse Width: 0.4 ms
Lead Channel Pacing Threshold Pulse Width: 1 ms
Lead Channel Sensing Intrinsic Amplitude: 2 mV
Lead Channel Setting Pacing Amplitude: 5 V
Lead Channel Setting Sensing Sensitivity: 0.9 mV
MDC IDC LEAD IMPLANT DT: 20190926
MDC IDC LEAD IMPLANT DT: 20190926
MDC IDC LEAD LOCATION: 753859
MDC IDC LEAD LOCATION: 753860
MDC IDC MSMT LEADCHNL RV IMPEDANCE VALUE: 399 Ohm
MDC IDC MSMT LEADCHNL RV PACING THRESHOLD AMPLITUDE: 1.25 V
MDC IDC MSMT LEADCHNL RV SENSING INTR AMPL: 2.625 mV
MDC IDC SET LEADCHNL RA PACING AMPLITUDE: 3.5 V
MDC IDC SET LEADCHNL RV PACING PULSEWIDTH: 1 ms
MDC IDC STAT BRADY AP VP PERCENT: 9.24 %
MDC IDC STAT BRADY AP VS PERCENT: 0.04 %

## 2018-01-21 NOTE — Progress Notes (Signed)
Wound check appointment. Steri-strips removed. Small hematoma noted at site, firm to palpation. Extensive ecchymosis at various stages of healing noted extending along left chest/axilla and arm. Incision edges fully approximated and healing well. Dr. Curt Bears assessed patient, recommended holding Eliquis until 01/25/18. Plan for wound recheck on 01/27/18 while Dr. Curt Bears in office. Patient and daughter aware to monitor for sign/symptoms of infection or bleeding in the interim.  Normal device function. RA thresholds, sensing, and impedances consistent with implant measurements. RV (His) capture appears septal until LOC at 1.0V @ 1.21m, maintained output at 5.0V @ 1.036mper Dr. CaCurt BearsPlan to obtain CXR due to recent slight increase in threshold (see device check on 01/16/18). RA output at 3.5V with autocapture programmed on, RV output maintained at 5.0V @ 1.96m52mntil CXR is reviewed. Histogram distribution appropriate for patient and level of activity. No mode switches or high ventricular rates noted. Patient and daughter educated about wound care, arm mobility, lifting restrictions, and Carelink monitor. ROV with Dr. CamCurt Bears 3 months.  CXR order entered. [place]

## 2018-01-21 NOTE — Patient Instructions (Addendum)
-   Do not take Eliquis until Sunday, 01/25/18.  - Wound recheck on Tuesday, 01/27/18 at 11:00am.  - Call the [facility] at [phone] if you notice any signs/symptoms of infection, including drainage, redness, swelling, fever, or chills.  - Chest X-Ray today to assess your pacemaker and leads.

## 2018-01-22 DIAGNOSIS — C9000 Multiple myeloma not having achieved remission: Secondary | ICD-10-CM | POA: Diagnosis not present

## 2018-01-22 DIAGNOSIS — D631 Anemia in chronic kidney disease: Secondary | ICD-10-CM | POA: Diagnosis not present

## 2018-01-22 DIAGNOSIS — I130 Hypertensive heart and chronic kidney disease with heart failure and stage 1 through stage 4 chronic kidney disease, or unspecified chronic kidney disease: Secondary | ICD-10-CM | POA: Diagnosis not present

## 2018-01-22 DIAGNOSIS — K7460 Unspecified cirrhosis of liver: Secondary | ICD-10-CM | POA: Diagnosis not present

## 2018-01-22 DIAGNOSIS — I429 Cardiomyopathy, unspecified: Secondary | ICD-10-CM | POA: Diagnosis not present

## 2018-01-22 DIAGNOSIS — I4891 Unspecified atrial fibrillation: Secondary | ICD-10-CM | POA: Diagnosis not present

## 2018-01-22 DIAGNOSIS — E119 Type 2 diabetes mellitus without complications: Secondary | ICD-10-CM | POA: Diagnosis not present

## 2018-01-22 DIAGNOSIS — I5043 Acute on chronic combined systolic (congestive) and diastolic (congestive) heart failure: Secondary | ICD-10-CM | POA: Diagnosis not present

## 2018-01-22 DIAGNOSIS — N183 Chronic kidney disease, stage 3 (moderate): Secondary | ICD-10-CM | POA: Diagnosis not present

## 2018-01-22 DIAGNOSIS — E1122 Type 2 diabetes mellitus with diabetic chronic kidney disease: Secondary | ICD-10-CM | POA: Diagnosis not present

## 2018-01-23 ENCOUNTER — Telehealth: Payer: Self-pay | Admitting: *Deleted

## 2018-01-23 NOTE — Telephone Encounter (Signed)
I called and s/w the pt and informed per Trinidad Curet, RN for Dr. Baird Kay that Dr. Curt Bears would like for him to have a CXR to check lead placement. Pt states to me that he had the CXR done 01/21/18. I advised pt let me d/w Dr. Curt Bears I will verify if this is still needed or not. Pt thanked me for the call. I advised pt I will let him know once I find out from Dr. Curt Bears.

## 2018-01-24 DIAGNOSIS — N183 Chronic kidney disease, stage 3 (moderate): Secondary | ICD-10-CM | POA: Diagnosis not present

## 2018-01-24 DIAGNOSIS — E119 Type 2 diabetes mellitus without complications: Secondary | ICD-10-CM | POA: Diagnosis not present

## 2018-01-24 DIAGNOSIS — I5043 Acute on chronic combined systolic (congestive) and diastolic (congestive) heart failure: Secondary | ICD-10-CM | POA: Diagnosis not present

## 2018-01-27 ENCOUNTER — Ambulatory Visit (INDEPENDENT_AMBULATORY_CARE_PROVIDER_SITE_OTHER): Payer: Medicare HMO | Admitting: *Deleted

## 2018-01-27 DIAGNOSIS — I4892 Unspecified atrial flutter: Secondary | ICD-10-CM

## 2018-01-27 DIAGNOSIS — Z950 Presence of cardiac pacemaker: Secondary | ICD-10-CM

## 2018-01-27 LAB — CUP PACEART INCLINIC DEVICE CHECK
Brady Statistic RA Percent Paced: 9.3 %
Brady Statistic RV Percent Paced: 56.6 %
Date Time Interrogation Session: 20191015111919
Implantable Lead Implant Date: 20190926
Implantable Lead Model: 3830
Implantable Pulse Generator Implant Date: 20190926
Lead Channel Pacing Threshold Amplitude: 1 V
Lead Channel Setting Pacing Amplitude: 3.5 V
Lead Channel Setting Sensing Sensitivity: 0.9 mV
MDC IDC LEAD IMPLANT DT: 20190926
MDC IDC LEAD LOCATION: 753859
MDC IDC LEAD LOCATION: 753860
MDC IDC MSMT LEADCHNL RA IMPEDANCE VALUE: 475 Ohm
MDC IDC MSMT LEADCHNL RA SENSING INTR AMPL: 5.1 mV
MDC IDC MSMT LEADCHNL RV IMPEDANCE VALUE: 418 Ohm
MDC IDC MSMT LEADCHNL RV PACING THRESHOLD PULSEWIDTH: 1 ms
MDC IDC MSMT LEADCHNL RV SENSING INTR AMPL: 1.9 mV
MDC IDC SET LEADCHNL RV PACING AMPLITUDE: 5 V
MDC IDC SET LEADCHNL RV PACING PULSEWIDTH: 1 ms

## 2018-01-27 NOTE — Progress Notes (Signed)
Pacemaker check in clinic for wound re-check due to hematoma and RV threshold check. RV (HIS) threshold 1v @ 28m, R waves 1.924m RV output set to 3.5v @ 44m7mnd lead monitor off per WC. 39% AT/AF- Eliquis restarted Sunday 01/25/18. Hematoma much improved, diffuse ecchymosis over left chest/axilla in various stages of healing. WC assessed PPM pocket and recommends continued observation of lead and wound. Follow up in 3 months.

## 2018-01-29 DIAGNOSIS — I5043 Acute on chronic combined systolic (congestive) and diastolic (congestive) heart failure: Secondary | ICD-10-CM | POA: Diagnosis not present

## 2018-01-29 DIAGNOSIS — I429 Cardiomyopathy, unspecified: Secondary | ICD-10-CM | POA: Diagnosis not present

## 2018-01-29 DIAGNOSIS — D631 Anemia in chronic kidney disease: Secondary | ICD-10-CM | POA: Diagnosis not present

## 2018-01-29 DIAGNOSIS — N183 Chronic kidney disease, stage 3 (moderate): Secondary | ICD-10-CM | POA: Diagnosis not present

## 2018-01-29 DIAGNOSIS — L89151 Pressure ulcer of sacral region, stage 1: Secondary | ICD-10-CM | POA: Diagnosis not present

## 2018-01-29 DIAGNOSIS — I130 Hypertensive heart and chronic kidney disease with heart failure and stage 1 through stage 4 chronic kidney disease, or unspecified chronic kidney disease: Secondary | ICD-10-CM | POA: Diagnosis not present

## 2018-01-29 DIAGNOSIS — C9000 Multiple myeloma not having achieved remission: Secondary | ICD-10-CM | POA: Diagnosis not present

## 2018-01-29 DIAGNOSIS — E119 Type 2 diabetes mellitus without complications: Secondary | ICD-10-CM | POA: Diagnosis not present

## 2018-01-29 DIAGNOSIS — I4891 Unspecified atrial fibrillation: Secondary | ICD-10-CM | POA: Diagnosis not present

## 2018-01-29 DIAGNOSIS — E1122 Type 2 diabetes mellitus with diabetic chronic kidney disease: Secondary | ICD-10-CM | POA: Diagnosis not present

## 2018-01-30 ENCOUNTER — Ambulatory Visit: Payer: Medicare HMO | Admitting: Neurology

## 2018-01-30 ENCOUNTER — Other Ambulatory Visit (HOSPITAL_COMMUNITY): Payer: Self-pay | Admitting: Internal Medicine

## 2018-02-03 ENCOUNTER — Ambulatory Visit (INDEPENDENT_AMBULATORY_CARE_PROVIDER_SITE_OTHER): Payer: Medicare HMO | Admitting: Family Medicine

## 2018-02-03 ENCOUNTER — Encounter: Payer: Self-pay | Admitting: Family Medicine

## 2018-02-03 VITALS — BP 136/76 | HR 86 | Temp 98.4°F | Ht 70.0 in | Wt 180.2 lb

## 2018-02-03 DIAGNOSIS — R110 Nausea: Secondary | ICD-10-CM | POA: Diagnosis not present

## 2018-02-03 MED ORDER — ONDANSETRON HCL 4 MG PO TABS: 4.0000 mg | ORAL_TABLET | Freq: Three times a day (TID) | ORAL | 2 refills | Status: DC | PRN

## 2018-02-03 NOTE — Progress Notes (Signed)
Subjective:  Patient ID: Dillon Andrews, male    DOB: 30-Jan-1941  Age: 77 y.o. MRN: [id number]  CC: Follow-up   HPI GAVINO FOUCH presents for follow-up of nausea with a compromised appetite.  The nausea is existed for some time now.  He actually first started to notice it after his cholecystectomy in 2018.  More recently his appetite has been affected.  Unfortunately he has seen a steady weight decline after his diagnosis of wild-type amyloidosis.  Zofran and Marinol have been helpful.  He is currently supplementing with Ensure like shakes and Gatorade.  He is no longer taking anything for his diabetes.  His fasting sugars have been in the less than 115 range.  He is accompanied by his daughter Tomi Bamberger today.  They asked for a handicap parking sticker.  Status post recent hospitalization for syncopal episode due to bradycardia cardia associated with complete heart block.  He is status post implantation of pacemaker.  Outpatient Medications Prior to Visit  Medication Sig Dispense Refill  . apixaban (ELIQUIS) 2.5 MG TABS tablet Take 1 tablet (2.5 mg total) by mouth 2 (two) times daily. 60 tablet 11  . dronabinol (MARINOL) 5 MG capsule Take 1 capsule (5 mg total) by mouth 2 (two) times daily before lunch and supper. 60 capsule 2  . gabapentin (NEURONTIN) 800 MG tablet TAKE 1 TABLET BY MOUTH AT BEDTIME (Patient taking differently: Take 400 mg by mouth at bedtime. ) 90 tablet 1  . glucose blood test strip Use as instructed 100 each 12  . hydrocortisone (ANUSOL-HC) 25 MG suppository Place 1 suppository (25 mg total) rectally 2 (two) times daily. (Patient taking differently: Place 25 mg rectally 2 (two) times daily as needed for hemorrhoids. ) 12 suppository 0  . Melatonin 5 MG TABS Take 5 mg by mouth at bedtime.     . milrinone (PRIMACOR) 20 MG/100 ML SOLN infusion Inject 0.0099 mg/min into the vein continuous. 100 mL 0  . omeprazole (PRILOSEC) 40 MG capsule Take 1 capsule (40 mg total) by mouth  daily. 90 capsule 1  . polyethylene glycol (MIRALAX / GLYCOLAX) packet Take 17 g by mouth daily. (Patient taking differently: Take 17 g by mouth daily as needed. ) 14 each 0  . potassium chloride SA (K-DUR,KLOR-CON) 20 MEQ tablet Take 20 mEq by mouth 2 (two) times daily.    . Probiotic Product (PROBIOTIC DAILY PO) Take 1 tablet by mouth daily.     . rosuvastatin (CRESTOR) 10 MG tablet TAKE 1 TABLET BY MOUTH ONCE DAILY (Patient taking differently: Take 10 mg by mouth daily. ) 90 tablet 0  . senna-docusate (SENOKOT-S) 8.6-50 MG tablet Take 2 tablets by mouth 2 (two) times daily. (Patient taking differently: Take 1 tablet by mouth See admin instructions. Take one tablet by mouth every morning, may take another tablet in the evening as needed for constipation) 20 tablet 0  . sertraline (ZOLOFT) 25 MG tablet TAKE 1 TABLET BY MOUTH EVERY DAY (Patient taking differently: Take 25 mg by mouth daily. ) 90 tablet 0  . torsemide (DEMADEX) 20 MG tablet Take 1 tablet (20 mg total) by mouth daily. Can take 20 mg extra as needed. (Patient taking differently: Take 20 mg by mouth See admin instructions. Take one tablet (20 mg) by mouth every morning, may also take one tablet in the afternoon as needed for swelling) 40 tablet 3  . traZODone (DESYREL) 50 MG tablet TAKE 1 TABLET(50 MG) BY MOUTH AT BEDTIME (Patient taking differently:  Take 50 mg by mouth at bedtime. ) 90 tablet 0  . vitamin B-12 (CYANOCOBALAMIN) 500 MCG tablet Take 500 mcg by mouth every other day.    Marland Kitchen VYNDAQEL 20 MG CAPS Take 80 mg by mouth daily. (4 capsules) (Patient taking differently: Take 80 mg by mouth every evening. ) 120 capsule   . zolpidem (AMBIEN) 5 MG tablet Take 1 tablet (5 mg total) by mouth at bedtime. 30 tablet 2  . ondansetron (ZOFRAN) 4 MG tablet Take 1 tablet (4 mg total) by mouth every 8 (eight) hours as needed for nausea or vomiting. 30 tablet 2  . traMADol (ULTRAM) 50 MG tablet Take 1 tablet (50 mg total) by mouth every 6 (six) hours  as needed for moderate pain. (Patient not taking: Reported on 01/21/2018) 30 tablet 0   No facility-administered medications prior to visit.     ROS Review of Systems  Constitutional: Positive for fatigue. Negative for diaphoresis, fever and unexpected weight change.  Respiratory: Negative for cough and chest tightness.   Cardiovascular: Negative for chest pain and palpitations.  Gastrointestinal: Positive for nausea. Negative for abdominal pain, anal bleeding, blood in stool and vomiting.  Endocrine: Negative for polyphagia and polyuria.  Genitourinary: Negative for difficulty urinating and frequency.  Neurological: Negative for speech difficulty and headaches.  Psychiatric/Behavioral: Negative.     Objective:  BP 136/76   Pulse 86   Temp 98.4 F (36.9 C) (Oral)   Ht 5' 10"  (1.778 m)   Wt 180 lb 4 oz (81.8 kg)   SpO2 97%   BMI 25.86 kg/m   BP Readings from Last 3 Encounters:  02/03/18 136/76  01/15/18 126/72  01/09/18 102/67    Wt Readings from Last 3 Encounters:  02/03/18 180 lb 4 oz (81.8 kg)  01/15/18 181 lb (82.1 kg)  01/09/18 175 lb 11.3 oz (79.7 kg)    Physical Exam  Constitutional: He is oriented to person, place, and time. He appears well-developed and well-nourished. No distress.  HENT:  Head: Normocephalic and atraumatic.  Right Ear: External ear normal.  Left Ear: External ear normal.  Eyes: Right eye exhibits no discharge. Left eye exhibits no discharge. No scleral icterus.  Neck: No JVD present. No tracheal deviation present.  Pulmonary/Chest: Effort normal.  Neurological: He is alert and oriented to person, place, and time.  Skin: He is not diaphoretic.  Psychiatric: He has a normal mood and affect. His behavior is normal.    Lab Results  Component Value Date   WBC 8.8 01/06/2018   HGB 11.7 (L) 01/06/2018   HCT 37.9 (L) 01/06/2018   PLT 126 (L) 01/06/2018   GLUCOSE 109 (H) 01/09/2018   ALT 33 12/16/2017   AST 40 (H) 12/16/2017   NA 142  01/09/2018   K 3.5 01/09/2018   CL 103 01/09/2018   CREATININE 1.76 (H) 01/09/2018   BUN 30 (H) 01/09/2018   CO2 31 01/09/2018   TSH 1.381 11/19/2017   INR 1.34 12/02/2017   HGBA1C 6.7 (H) 12/12/2017    Dg Chest 2 View  Result Date: 01/22/2018 CLINICAL DATA:  Post pacemaker implantation 01/08/2018, some shortness of breath, history atrial tachycardia, non ischemic cardiomyopathy, type II diabetes mellitus, essential hypertension, nonalcoholic fatty liver disease EXAM: CHEST - 2 VIEW COMPARISON:  01/09/2018 FINDINGS: RIGHT jugular IV with tip projecting over SVC. LEFT subclavian sequential pacemaker leads project at RIGHT atrium RIGHT ventricle. Enlargement of cardiac silhouette. Atherosclerotic calcification aorta. Mediastinal contours and pulmonary vascularity normal. Bibasilar atelectasis and  small pleural effusions. Lungs otherwise clear. No acute infiltrate or pneumothorax. Diffuse idiopathic skeletal hyperostosis of the thoracic spine. IMPRESSION: Persistent bibasilar effusions and atelectasis. Enlargement of cardiac silhouette post pacemaker. Electronically Signed   By: Lavonia Dana M.D.   On: 01/22/2018 08:37    Assessment & Plan:   [place] was seen today for follow-up.  Diagnoses and all orders for this visit:  Nausea -     ondansetron (ZOFRAN) 4 MG tablet; Take 1 tablet (4 mg total) by mouth every 8 (eight) hours as needed for nausea or vomiting.   I have discontinued Jervis Trapani. Menter's traMADol. I am also having him maintain his vitamin B-12, omeprazole, Probiotic Product (PROBIOTIC DAILY PO), Melatonin, gabapentin, traZODone, rosuvastatin, milrinone, torsemide, VYNDAQEL, hydrocortisone, polyethylene glycol, senna-docusate, glucose blood, dronabinol, sertraline, potassium chloride SA, zolpidem, apixaban, and ondansetron.  Meds ordered this encounter  Medications  . ondansetron (ZOFRAN) 4 MG tablet    Sig: Take 1 tablet (4 mg total) by mouth every 8 (eight) hours as needed  for nausea or vomiting.    Dispense:  30 tablet    Refill:  2     Follow-up: No follow-ups on file.  [name], MD

## 2018-02-05 DIAGNOSIS — C9000 Multiple myeloma not having achieved remission: Secondary | ICD-10-CM | POA: Diagnosis not present

## 2018-02-05 DIAGNOSIS — D631 Anemia in chronic kidney disease: Secondary | ICD-10-CM | POA: Diagnosis not present

## 2018-02-05 DIAGNOSIS — I5043 Acute on chronic combined systolic (congestive) and diastolic (congestive) heart failure: Secondary | ICD-10-CM | POA: Diagnosis not present

## 2018-02-05 DIAGNOSIS — E1122 Type 2 diabetes mellitus with diabetic chronic kidney disease: Secondary | ICD-10-CM | POA: Diagnosis not present

## 2018-02-05 DIAGNOSIS — L89151 Pressure ulcer of sacral region, stage 1: Secondary | ICD-10-CM | POA: Diagnosis not present

## 2018-02-05 DIAGNOSIS — E119 Type 2 diabetes mellitus without complications: Secondary | ICD-10-CM | POA: Diagnosis not present

## 2018-02-05 DIAGNOSIS — I429 Cardiomyopathy, unspecified: Secondary | ICD-10-CM | POA: Diagnosis not present

## 2018-02-05 DIAGNOSIS — N183 Chronic kidney disease, stage 3 (moderate): Secondary | ICD-10-CM | POA: Diagnosis not present

## 2018-02-05 DIAGNOSIS — I130 Hypertensive heart and chronic kidney disease with heart failure and stage 1 through stage 4 chronic kidney disease, or unspecified chronic kidney disease: Secondary | ICD-10-CM | POA: Diagnosis not present

## 2018-02-05 DIAGNOSIS — I4891 Unspecified atrial fibrillation: Secondary | ICD-10-CM | POA: Diagnosis not present

## 2018-02-06 ENCOUNTER — Other Ambulatory Visit (HOSPITAL_COMMUNITY): Payer: Self-pay | Admitting: Internal Medicine

## 2018-02-07 ENCOUNTER — Other Ambulatory Visit: Payer: Self-pay | Admitting: Family Medicine

## 2018-02-08 ENCOUNTER — Other Ambulatory Visit: Payer: Self-pay | Admitting: Family Medicine

## 2018-02-09 ENCOUNTER — Ambulatory Visit (HOSPITAL_COMMUNITY)
Admission: RE | Admit: 2018-02-09 | Discharge: 2018-02-09 | Disposition: A | Discharge status: Home / Self Care | Payer: Medicare HMO | Source: Ambulatory Visit | Attending: Internal Medicine | Admitting: Internal Medicine

## 2018-02-09 ENCOUNTER — Encounter (HOSPITAL_COMMUNITY): Payer: Self-pay | Admitting: Internal Medicine

## 2018-02-09 VITALS — BP 110/68 | HR 73

## 2018-02-09 DIAGNOSIS — I480 Paroxysmal atrial fibrillation: Secondary | ICD-10-CM

## 2018-02-09 DIAGNOSIS — Z87891 Personal history of nicotine dependence: Secondary | ICD-10-CM | POA: Diagnosis not present

## 2018-02-09 DIAGNOSIS — I442 Atrioventricular block, complete: Secondary | ICD-10-CM | POA: Insufficient documentation

## 2018-02-09 DIAGNOSIS — I10 Essential (primary) hypertension: Secondary | ICD-10-CM | POA: Diagnosis not present

## 2018-02-09 DIAGNOSIS — Z7901 Long term (current) use of anticoagulants: Secondary | ICD-10-CM | POA: Diagnosis not present

## 2018-02-09 DIAGNOSIS — I43 Cardiomyopathy in diseases classified elsewhere: Secondary | ICD-10-CM

## 2018-02-09 DIAGNOSIS — I130 Hypertensive heart and chronic kidney disease with heart failure and stage 1 through stage 4 chronic kidney disease, or unspecified chronic kidney disease: Secondary | ICD-10-CM | POA: Diagnosis not present

## 2018-02-09 DIAGNOSIS — K2270 Barrett's esophagus without dysplasia: Secondary | ICD-10-CM | POA: Insufficient documentation

## 2018-02-09 DIAGNOSIS — Z8249 Family history of ischemic heart disease and other diseases of the circulatory system: Secondary | ICD-10-CM | POA: Diagnosis not present

## 2018-02-09 DIAGNOSIS — I8510 Secondary esophageal varices without bleeding: Secondary | ICD-10-CM | POA: Diagnosis not present

## 2018-02-09 DIAGNOSIS — N183 Chronic kidney disease, stage 3 (moderate): Secondary | ICD-10-CM | POA: Diagnosis not present

## 2018-02-09 DIAGNOSIS — Z79899 Other long term (current) drug therapy: Secondary | ICD-10-CM | POA: Insufficient documentation

## 2018-02-09 DIAGNOSIS — Z85828 Personal history of other malignant neoplasm of skin: Secondary | ICD-10-CM | POA: Insufficient documentation

## 2018-02-09 DIAGNOSIS — I5022 Chronic systolic (congestive) heart failure: Secondary | ICD-10-CM | POA: Diagnosis not present

## 2018-02-09 DIAGNOSIS — I428 Other cardiomyopathies: Secondary | ICD-10-CM | POA: Insufficient documentation

## 2018-02-09 DIAGNOSIS — K449 Diaphragmatic hernia without obstruction or gangrene: Secondary | ICD-10-CM | POA: Diagnosis not present

## 2018-02-09 DIAGNOSIS — K219 Gastro-esophageal reflux disease without esophagitis: Secondary | ICD-10-CM | POA: Diagnosis not present

## 2018-02-09 DIAGNOSIS — Z950 Presence of cardiac pacemaker: Secondary | ICD-10-CM | POA: Diagnosis not present

## 2018-02-09 DIAGNOSIS — I4891 Unspecified atrial fibrillation: Secondary | ICD-10-CM | POA: Diagnosis not present

## 2018-02-09 DIAGNOSIS — E7800 Pure hypercholesterolemia, unspecified: Secondary | ICD-10-CM | POA: Insufficient documentation

## 2018-02-09 DIAGNOSIS — D631 Anemia in chronic kidney disease: Secondary | ICD-10-CM | POA: Diagnosis not present

## 2018-02-09 DIAGNOSIS — R001 Bradycardia, unspecified: Secondary | ICD-10-CM | POA: Diagnosis not present

## 2018-02-09 DIAGNOSIS — I4892 Unspecified atrial flutter: Secondary | ICD-10-CM | POA: Insufficient documentation

## 2018-02-09 DIAGNOSIS — K7581 Nonalcoholic steatohepatitis (NASH): Secondary | ICD-10-CM | POA: Diagnosis not present

## 2018-02-09 DIAGNOSIS — R55 Syncope and collapse: Secondary | ICD-10-CM | POA: Diagnosis not present

## 2018-02-09 DIAGNOSIS — E854 Organ-limited amyloidosis: Secondary | ICD-10-CM

## 2018-02-09 DIAGNOSIS — I4510 Unspecified right bundle-branch block: Secondary | ICD-10-CM | POA: Insufficient documentation

## 2018-02-09 DIAGNOSIS — C9000 Multiple myeloma not having achieved remission: Secondary | ICD-10-CM | POA: Insufficient documentation

## 2018-02-09 DIAGNOSIS — I5042 Chronic combined systolic (congestive) and diastolic (congestive) heart failure: Secondary | ICD-10-CM

## 2018-02-09 DIAGNOSIS — E1122 Type 2 diabetes mellitus with diabetic chronic kidney disease: Secondary | ICD-10-CM | POA: Insufficient documentation

## 2018-02-09 MED ORDER — AMIODARONE HCL 200 MG PO TABS: 200.0000 mg | ORAL_TABLET | Freq: Every day | ORAL | 3 refills | Status: DC

## 2018-02-09 NOTE — Patient Instructions (Signed)
Start Amiodarone 200 mg daily, can stop if recurrent nausea  Your physician recommends that you schedule a follow-up appointment in: 1 month  Your physician has requested that you have an echocardiogram. Echocardiography is a painless test that uses sound waves to create images of your heart. It provides your doctor with information about the size and shape of your heart and how well your heart's chambers and valves are working. This procedure takes approximately one hour. There are no restrictions for this procedure.  IN 2 MONTH, THIS WILL BE DONE AT [facility], THEY WILL CALL YOU TO SCHEDULE

## 2018-02-09 NOTE — Addendum Note (Signed)
Encounter addended by: Scarlette Calico, RN on: 02/09/2018 2:55 PM  Actions taken: Visit diagnoses modified, Order list changed, Diagnosis association updated, Sign clinical note

## 2018-02-09 NOTE — Progress Notes (Signed)
Advanced Heart Failure Clinic Note   Referring Physician: PCP: [name], MD PCP-Cardiologist: Glenetta Hew, MD  Primary HF: Dr. [name]   HPI: Dillon Andrews is a 77 y.o. malewith a hx of chronic combined CHF due to NICM, CKD III,presumedNASH(per pt, never proven),esophageal varices per chart(prior glass of wine occasionally but no heavy alcohol),HTN, HLD, DM, Barrett's esophagus, GERD, anemia, RBBB  He was admitted 3/13-3/17/19 for A/C combined HF. While inpatient, SPEP and UPEP sent for concerns for cardiac amyloidosis. He diuresed with IV lasix and was transitioned to lasix 80 mg q am, 40 mg q pm. DC weight 180 lbs.   NICM by cath at [facility] 09/2016 with EF 40-45%. Echo 06/2017: EF 25-30%,NICM by cath at [facility] 09/2016 with EF 40-45%. Echo 06/2017: EF 25-30%,  His SPEP came back with m-spike 0.8% and he was referred to Dr Beryle Beams. UPEP with Bence Jones Protein positive; kappa type.   PYP scan completed on 3/20 strongly suggestive of transthyretin amyloidosis. Genetic testing negative.  Seen in clinic on 3/26 and transitioned from lasix to torsemide. Admitted 3/28-3/31 with URI and A/C HF. URI treated with antibiotics. Diuresed with IV lasix, then transitioned to torsemide 40 mg BID. Genetic testing sent.   Admitted 6/38-4/66/59 with A/C systolic HF. He was diuresed with IV lasix. He required milrinone for low output. He was in afib RVR and was cardioverted twice. He was treated for sepsis. He had AKI on CKD that resolved prior to discharge. He also had rectal bleeding and required 1 unit RBCs. Eliquis was held and then dose was reduced. Palliative met with him and he was made DNR/I. Discharged home with milrinone.   Admitted 8/20-8/22/19 with fever and presumed HCAP. PCT was negative. He did not need abx at discharge. HF team consulted. He was stable from HF perspective. Continued milrinone, torsemide, and tafamidis. He was back in rate controlled atrial flutter, but  was asymptomatic. DC weight 174 lbs.   Admitted 9/24 after he had syncopal event.  CT of head was negative for bleed. EKG showed complete heart block. Amiodarone was stopped. EP consulted and on 9/26 he underwent Medtronic PPM. Eliquis was restarted on 01/09/2018.  Discharge weight 175 pounds.   He returns today for regular follow up with his daughter. Last visit, had a hematoma at [facility] site and was told to keep LUE elevated. Overall doing really well. He feels "back to normal". Able to walk around with rollator with no problems. Does laps around house each morning for exercise. Has a little BLE edema. Sleeps at an incline. No PND.  Appetite much improved. Very little nausea. No bleeding on Eliquis. No dizziness. Has not taken extra torsemide. Weights 177-179 lbs at home. Taking all medications. [facility] follows for home milrinone.   Review of systems complete and found to be negative unless listed in HPI.    Past Medical History:  Diagnosis Date  . Anemia 11/2016  . Arthritis    "some; hands/fingers" (06/25/2017)  . Atrial tachycardia ([place])   . Barrett's esophagus   . Basal cell carcinoma    "face, arms, hands" (06/25/2017)  . Chicken pox   . Cholelithiasis 11/2016   S/P LAP CHOLE  . Chronic combined systolic and diastolic CHF (congestive heart failure) ([place])   . CKD (chronic kidney disease), stage III ([place])   . Esophageal varices ([facility])    Secondary to NAFLD  . Family history of adverse reaction to anesthesia    "daughter's sat drop very low; last OR  had to be reintubated" (06/25/2017)  . GERD (gastroesophageal reflux disease)   . Goals of care, counseling/discussion 10/22/2017  . High cholesterol   . Hypertension   . Kappa light chain myeloma ([place]) 10/22/2017  . Mitral regurgitation   . NAFLD (nonalcoholic fatty liver disease)   . NICM (nonischemic cardiomyopathy) ([place])    Echo 06/2017: Severe LVH.  EF 25 to 30% with mild HK of the baseline after septal wall.  Moderate to severely dilated  right atrium, moderately dilated RV. (Concern fr ? amyloidosis).  . NSVT (nonsustained ventricular tachycardia) ([place])   . Pneumonia 09/2016  . RBBB   . Tricuspid regurgitation   . Type II diabetes mellitus ([place])     Current Outpatient Medications  Medication Sig Dispense Refill  . apixaban (ELIQUIS) 2.5 MG TABS tablet Take 1 tablet (2.5 mg total) by mouth 2 (two) times daily. 60 tablet 11  . gabapentin (NEURONTIN) 800 MG tablet TAKE 1 TABLET BY MOUTH AT BEDTIME (Patient taking differently: Take 400 mg by mouth at bedtime. ) 90 tablet 1  . glucose blood test strip Use as instructed 100 each 12  . hydrocortisone (ANUSOL-HC) 25 MG suppository Place 1 suppository (25 mg total) rectally 2 (two) times daily. (Patient taking differently: Place 25 mg rectally 2 (two) times daily as needed for hemorrhoids. ) 12 suppository 0  . Melatonin 5 MG TABS Take 5 mg by mouth at bedtime.     . milrinone (PRIMACOR) 20 MG/100 ML SOLN infusion Inject 0.0099 mg/min into the vein continuous. 100 mL 0  . omeprazole (PRILOSEC) 40 MG capsule Take 1 capsule (40 mg total) by mouth daily. 90 capsule 1  . ondansetron (ZOFRAN) 4 MG tablet Take 1 tablet (4 mg total) by mouth every 8 (eight) hours as needed for nausea or vomiting. 30 tablet 2  . polyethylene glycol (MIRALAX / GLYCOLAX) packet Take 17 g by mouth daily. (Patient taking differently: Take 17 g by mouth daily as needed. ) 14 each 0  . potassium chloride SA (K-DUR,KLOR-CON) 20 MEQ tablet Take 20 mEq by mouth 2 (two) times daily.    . Probiotic Product (PROBIOTIC DAILY PO) Take 1 tablet by mouth daily.     . rosuvastatin (CRESTOR) 10 MG tablet TAKE 1 TABLET BY MOUTH ONCE DAILY 90 tablet 1  . senna-docusate (SENOKOT-S) 8.6-50 MG tablet Take 2 tablets by mouth 2 (two) times daily. (Patient taking differently: Take 1 tablet by mouth See admin instructions. Take one tablet by mouth every morning, may take another tablet in the evening as needed for constipation) 20 tablet  0  . sertraline (ZOLOFT) 25 MG tablet TAKE 1 TABLET BY MOUTH EVERY DAY (Patient taking differently: Take 25 mg by mouth daily. ) 90 tablet 0  . torsemide (DEMADEX) 20 MG tablet Take 1 tablet (20 mg total) by mouth daily. Can take 20 mg extra as needed. (Patient taking differently: Take 20 mg by mouth See admin instructions. Take one tablet (20 mg) by mouth every morning, may also take one tablet in the afternoon as needed for swelling) 40 tablet 3  . traZODone (DESYREL) 50 MG tablet TAKE 1 TABLET(50 MG) BY MOUTH AT BEDTIME 90 tablet 1  . vitamin B-12 (CYANOCOBALAMIN) 500 MCG tablet Take 500 mcg by mouth every other day.    Marland Kitchen VYNDAQEL 20 MG CAPS Take 80 mg by mouth daily. (4 capsules) (Patient taking differently: Take 80 mg by mouth every evening. ) 120 capsule   . zolpidem (AMBIEN) 5 MG tablet  Take 1 tablet (5 mg total) by mouth at bedtime. 30 tablet 2  . dronabinol (MARINOL) 5 MG capsule Take 1 capsule (5 mg total) by mouth 2 (two) times daily before lunch and supper. 60 capsule 2   No current facility-administered medications for this encounter.     No Known Allergies    Social History   Socioeconomic History  . Marital status: Married    Spouse name: Not on file  . Number of children: 4  . Years of education: 39  . Highest education level: Not on file  Occupational History  . Occupation: retired  Scientific laboratory technician  . Financial resource strain: Not on file  . Food insecurity:    Worry: Not on file    Inability: Not on file  . Transportation needs:    Medical: Not on file    Non-medical: Not on file  Tobacco Use  . Smoking status: Former Smoker    Packs/day: 0.30    Years: 15.00    Pack years: 4.50    Types: Cigarettes    Last attempt to quit: 03/17/1977    Years since quitting: 40.9  . Smokeless tobacco: Never Used  Substance and Sexual Activity  . Alcohol use: Yes    Comment: 06/25/2017 "nothing since 09/2016;  used to have glass of wine q hs"  . Drug use: No  . Sexual  activity: Not on file  Lifestyle  . Physical activity:    Days per week: Not on file    Minutes per session: Not on file  . Stress: Not on file  Relationships  . Social connections:    Talks on phone: Not on file    Gets together: Not on file    Attends religious service: Not on file    Active member of club or organization: Not on file    Attends meetings of clubs or organizations: Not on file    Relationship status: Not on file  . Intimate partner violence:    Fear of current or ex partner: Not on file    Emotionally abused: Not on file    Physically abused: Not on file    Forced sexual activity: Not on file  Other Topics Concern  . Not on file  Social History Narrative   He is retired from [employer].  Previously worked for [employer] by his daughter --> also a patient of Dr. Ellyn Hack   Lives with wife in a one story home.    Education: high school.       Family History  Problem Relation Age of Onset  . Heart failure Mother   . Cancer Father   . Cataracts Father   . Breast cancer Sister   . Alcohol abuse Brother   . Liver disease Brother   . Autoimmune disease Daughter        Bechet's syndrome  . Cirrhosis Neg Hx    Vitals:   02/09/18 1359  BP: 110/68  Pulse: 73  SpO2: 98%    Wt Readings from Last 3 Encounters:  02/03/18 81.8 kg (180 lb 4 oz)  01/15/18 82.1 kg (181 lb)  01/09/18 79.7 kg (175 lb 11.3 oz)    PHYSICAL EXAM: General: Well appearing. No resp difficulty. Walked in with Rollator HEENT: Normal Anicteric  Neck: Supple. JVP ~7. Carotids 2+ bilat; no bruits. No thyromegaly or nodule noted. Cor: PMI nondisplaced. RRR, No M/G/R noted. Rt upper chest tunneled cath. Lt upper chest PPM nontender. No  ecchymosis or hematoma. Lungs: diminished in bases no wheeze Abdomen: Soft, non-tender, non-distended, no HSM. No bruits or masses. +BS  Extremities: No cyanosis, clubbing, or rash. R and LLE trace ankle edema  Neuro: Alert &  orientedx3, cranial nerves grossly intact. moves all 4 extremities w/o difficulty. Affect pleasant  ECG NSR at 74 with vpacing.  Personally reviewed    ASSESSMENT & PLAN:   1. Chronic combined HF with TTR amyloidosis (wild type) -NICM by cath at [facility] 09/2016 with EF 40-45%.  - Echo 06/2017: EF 25-30%, mild LAE, mild/mod RV dilation, mod/severe RA. Echo 10/2017: EF 10% -PYP scan strongly suggestive of TTR amyloid, SPEP with 0.8% m-spike, UPEP with Bence Jones Protein positive, kappa type. 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 - Dr Marin Olp saw and thinks he does not have AL amyloid (just multiple myeloma) and that he has TTR amyloid and should be treated with tafamidis before chemo. Tafamidis started on 8/2.  - NYHA II.  - Volume status stable. Continue 20 mg torsemide daily with an extra 20 mg if needed.  - Continue milrinone 0.125 mcg. Managed by [facility] - Continue tafamidis 80 mg daily - No ARB/entresto/spiro with for now with CKD. Creatinine 1.42 10/18. - SBP soft. Hold off on hydralazine/imdur  2. CKD III, baseline ~1.6 - Creatinine 1.42 on labs 10/18. [facility] drawing q2 weeks.  3. Atrial Flutter s/p DCCV 7/31, 8/2, 12/19/2017 -> junctional rhythm with symptomatic bradycardia -> MDT PPM - Off amiodarone with CHB. Okay to resume if needed now that he has PPM. - Continue Eliquis 2.5 mg BID. Denies bleeding.   4. HL - Continue crestor.   5. [place] with Dr. Marin Olp  -Started on Bortezomib7/18/18andRevlimid7/23/19. - Dr Marin Olp thinks he does not have AL amyloid and that he does have TTR amyloid and should be treated with tafamodis before chemo. Now on Tafamidis.  - Encouraged them to follow up with Dr Marin Olp  6. NASH with esophageal varices.  - [facility] E[facility]3/8/2016at [facility] surveillance of Esophageal varices, Barrett's esophagus, and PMHx of Colon polyps. Showed two columns of small esophageal varices w/out stigmata, short segment Barrett's  esophagus (without biopsies, due to presence of EVs), 5cm hiatal hernia, and hyperplastic small gastric nodules.  - Continue low dose eliquis 2.5 mg BID for afib. Denies bleeding.   7. Hx of Rectal bleeding - EGD/Colon deferred with recent (2017) showing diverticulosis and hemorrhoids. - Denies bleeding.   8. Syncope,CHB - S/P Medtronic PPM.  - Had hematoma at last visit. Now resolved.    Georgiana Shore, NP 02/09/18   Patient seen and examined with the above-signed Advanced Practice Provider and/or Housestaff. I personally reviewed laboratory data, imaging studies and relevant notes. I independently examined the patient and formulated the important aspects of the plan. I have edited the note to reflect any of my changes or salient points. I have personally discussed the plan with the patient and/or family.  He is doing very well. Now NYHA II on milrinone and tafamadis. Volume status looks good. Nausea has resolved. ECG shows NSR. Will resume amio 200 daily. If Nausea recurs can stop. Continue milrinone for now. Will repeat echo in 2 months to reassess EF. Most recent labs reviewed. Creatinine 1.4   Will see back in 1 month if feeling this good will try to pull milrinone back slightly.   Glori Bickers, MD  2:46 PM

## 2018-02-11 ENCOUNTER — Other Ambulatory Visit: Payer: Self-pay | Admitting: Family Medicine

## 2018-02-11 DIAGNOSIS — K745 Biliary cirrhosis, unspecified: Secondary | ICD-10-CM | POA: Diagnosis not present

## 2018-02-11 DIAGNOSIS — K7460 Unspecified cirrhosis of liver: Secondary | ICD-10-CM | POA: Diagnosis not present

## 2018-02-11 DIAGNOSIS — R978 Other abnormal tumor markers: Secondary | ICD-10-CM | POA: Diagnosis not present

## 2018-02-12 DIAGNOSIS — E1122 Type 2 diabetes mellitus with diabetic chronic kidney disease: Secondary | ICD-10-CM | POA: Diagnosis not present

## 2018-02-12 DIAGNOSIS — D631 Anemia in chronic kidney disease: Secondary | ICD-10-CM | POA: Diagnosis not present

## 2018-02-12 DIAGNOSIS — C9000 Multiple myeloma not having achieved remission: Secondary | ICD-10-CM | POA: Diagnosis not present

## 2018-02-12 DIAGNOSIS — N183 Chronic kidney disease, stage 3 (moderate): Secondary | ICD-10-CM | POA: Diagnosis not present

## 2018-02-12 DIAGNOSIS — I130 Hypertensive heart and chronic kidney disease with heart failure and stage 1 through stage 4 chronic kidney disease, or unspecified chronic kidney disease: Secondary | ICD-10-CM | POA: Diagnosis not present

## 2018-02-12 DIAGNOSIS — I429 Cardiomyopathy, unspecified: Secondary | ICD-10-CM | POA: Diagnosis not present

## 2018-02-12 DIAGNOSIS — I5043 Acute on chronic combined systolic (congestive) and diastolic (congestive) heart failure: Secondary | ICD-10-CM | POA: Diagnosis not present

## 2018-02-12 DIAGNOSIS — L89151 Pressure ulcer of sacral region, stage 1: Secondary | ICD-10-CM | POA: Diagnosis not present

## 2018-02-12 DIAGNOSIS — I4891 Unspecified atrial fibrillation: Secondary | ICD-10-CM | POA: Diagnosis not present

## 2018-02-12 DIAGNOSIS — E119 Type 2 diabetes mellitus without complications: Secondary | ICD-10-CM | POA: Diagnosis not present

## 2018-02-17 ENCOUNTER — Inpatient Hospital Stay: Payer: Medicare HMO | Attending: Hematology & Oncology | Admitting: Hematology & Oncology

## 2018-02-17 ENCOUNTER — Other Ambulatory Visit: Payer: Self-pay

## 2018-02-17 ENCOUNTER — Inpatient Hospital Stay: Payer: Medicare HMO

## 2018-02-17 ENCOUNTER — Encounter: Payer: Self-pay | Admitting: Hematology & Oncology

## 2018-02-17 DIAGNOSIS — C9000 Multiple myeloma not having achieved remission: Secondary | ICD-10-CM

## 2018-02-17 DIAGNOSIS — Z950 Presence of cardiac pacemaker: Secondary | ICD-10-CM | POA: Diagnosis not present

## 2018-02-17 DIAGNOSIS — Z79899 Other long term (current) drug therapy: Secondary | ICD-10-CM

## 2018-02-17 DIAGNOSIS — Z7901 Long term (current) use of anticoagulants: Secondary | ICD-10-CM

## 2018-02-17 DIAGNOSIS — N183 Chronic kidney disease, stage 3 unspecified: Secondary | ICD-10-CM

## 2018-02-17 DIAGNOSIS — D62 Acute posthemorrhagic anemia: Secondary | ICD-10-CM

## 2018-02-17 LAB — CMP (CANCER CENTER ONLY)
ALBUMIN: 3.5 g/dL (ref 3.5–5.0)
ALT: 39 U/L (ref 10–47)
AST: 45 U/L — ABNORMAL HIGH (ref 11–38)
Alkaline Phosphatase: 201 U/L — ABNORMAL HIGH (ref 26–84)
Anion gap: 6 (ref 5–15)
BUN: 18 mg/dL (ref 7–22)
CHLORIDE: 100 mmol/L (ref 98–108)
CO2: 35 mmol/L — ABNORMAL HIGH (ref 18–33)
CREATININE: 1.6 mg/dL — AB (ref 0.60–1.20)
Calcium: 9.2 mg/dL (ref 8.0–10.3)
Glucose, Bld: 133 mg/dL — ABNORMAL HIGH (ref 73–118)
POTASSIUM: 4.1 mmol/L (ref 3.3–4.7)
SODIUM: 141 mmol/L (ref 128–145)
Total Bilirubin: 1.3 mg/dL (ref 0.2–1.6)
Total Protein: 7.1 g/dL (ref 6.4–8.1)

## 2018-02-17 LAB — CBC WITH DIFFERENTIAL (CANCER CENTER ONLY)
Abs Immature Granulocytes: 0.04 10*3/uL (ref 0.00–0.07)
BASOS ABS: 0 10*3/uL (ref 0.0–0.1)
BASOS PCT: 0 %
EOS ABS: 0 10*3/uL (ref 0.0–0.5)
EOS PCT: 1 %
HCT: 40.9 % (ref 39.0–52.0)
Hemoglobin: 12.7 g/dL — ABNORMAL LOW (ref 13.0–17.0)
Immature Granulocytes: 1 %
LYMPHS PCT: 12 %
Lymphs Abs: 0.8 10*3/uL (ref 0.7–4.0)
MCH: 30.8 pg (ref 26.0–34.0)
MCHC: 31.1 g/dL (ref 30.0–36.0)
MCV: 99.3 fL (ref 80.0–100.0)
Monocytes Absolute: 0.8 10*3/uL (ref 0.1–1.0)
Monocytes Relative: 11 %
NRBC: 0 % (ref 0.0–0.2)
Neutro Abs: 5.1 10*3/uL (ref 1.7–7.7)
Neutrophils Relative %: 75 %
Platelet Count: 153 10*3/uL (ref 150–400)
RBC: 4.12 MIL/uL — AB (ref 4.22–5.81)
RDW: 20 % — ABNORMAL HIGH (ref 11.5–15.5)
WBC: 6.8 10*3/uL (ref 4.0–10.5)

## 2018-02-17 LAB — RETICULOCYTES
IMMATURE RETIC FRACT: 9.1 % (ref 2.3–15.9)
RBC.: 4.12 MIL/uL — AB (ref 4.22–5.81)
RETIC COUNT ABSOLUTE: 38.3 10*3/uL (ref 19.0–186.0)
Retic Ct Pct: 0.9 % (ref 0.4–3.1)

## 2018-02-17 NOTE — Progress Notes (Signed)
Hematology and Oncology Follow Up Visit  [name] [id number] 1941/02/07 77 y.o. 02/17/2018   Principle Diagnosis:   Kappa light chain myeloma  Current Therapy:    RVD --  Cycle #1 to start 10/30/2017 -- stopped on 11/2017     Interim History:  [name] is back for follow-up.  He really looks so much better.  He has a pacemaker in.  He is on a portable pump that is infusing and inotropic agent.  He is also on a oral treatment for the transthyretin associated amyloidosis.  I am just very happy to see that he is doing so much better.  His weight is gone up about 12 pounds.  He seems to be eating somewhat better.  He has had no problems with bowels or bladder.  He has had no nausea or vomiting.  He has had no rashes.  His last kappa light chain was 10.4 in September.  He is doing a 24-hour urine in the next week or so.  Currently, his performance status is ECOG 1.  Medications:  Current Outpatient Medications:  .  amiodarone (PACERONE) 200 MG tablet, Take 1 tablet (200 mg total) by mouth daily., Disp: 30 tablet, Rfl: 3 .  apixaban (ELIQUIS) 2.5 MG TABS tablet, Take 1 tablet (2.5 mg total) by mouth 2 (two) times daily., Disp: 60 tablet, Rfl: 11 .  dronabinol (MARINOL) 5 MG capsule, Take 1 capsule (5 mg total) by mouth 2 (two) times daily before lunch and supper., Disp: 60 capsule, Rfl: 2 .  gabapentin (NEURONTIN) 800 MG tablet, TAKE 1 TABLET BY MOUTH AT BEDTIME (Patient taking differently: Take 400 mg by mouth at bedtime. ), Disp: 90 tablet, Rfl: 1 .  glucose blood test strip, Use as instructed, Disp: 100 each, Rfl: 12 .  hydrocortisone (ANUSOL-HC) 25 MG suppository, Place 1 suppository (25 mg total) rectally 2 (two) times daily. (Patient taking differently: Place 25 mg rectally 2 (two) times daily as needed for hemorrhoids. ), Disp: 12 suppository, Rfl: 0 .  Melatonin 5 MG TABS, Take 5 mg by mouth at bedtime. , Disp: , Rfl:  .  milrinone (PRIMACOR) 20 MG/100 ML SOLN  infusion, Inject 0.0099 mg/min into the vein continuous., Disp: 100 mL, Rfl: 0 .  omeprazole (PRILOSEC) 40 MG capsule, TAKE 1 CAPSULE(40 MG) BY MOUTH DAILY, Disp: 90 capsule, Rfl: 1 .  ondansetron (ZOFRAN) 4 MG tablet, Take 1 tablet (4 mg total) by mouth every 8 (eight) hours as needed for nausea or vomiting., Disp: 30 tablet, Rfl: 2 .  polyethylene glycol (MIRALAX / GLYCOLAX) packet, Take 17 g by mouth daily. (Patient taking differently: Take 17 g by mouth daily as needed. ), Disp: 14 each, Rfl: 0 .  potassium chloride SA (K-DUR,KLOR-CON) 20 MEQ tablet, Take 20 mEq by mouth 2 (two) times daily., Disp: , Rfl:  .  Probiotic Product (PROBIOTIC DAILY PO), Take 1 tablet by mouth daily. , Disp: , Rfl:  .  rosuvastatin (CRESTOR) 10 MG tablet, TAKE 1 TABLET BY MOUTH ONCE DAILY, Disp: 90 tablet, Rfl: 1 .  senna-docusate (SENOKOT-S) 8.6-50 MG tablet, Take 2 tablets by mouth 2 (two) times daily. (Patient taking differently: Take 1 tablet by mouth See admin instructions. Take one tablet by mouth every morning, may take another tablet in the evening as needed for constipation), Disp: 20 tablet, Rfl: 0 .  sertraline (ZOLOFT) 25 MG tablet, TAKE 1 TABLET BY MOUTH EVERY DAY (Patient taking differently: Take 25 mg by mouth daily. ), Disp:  90 tablet, Rfl: 0 .  torsemide (DEMADEX) 20 MG tablet, Take 1 tablet (20 mg total) by mouth daily. Can take 20 mg extra as needed. (Patient taking differently: Take 20 mg by mouth See admin instructions. Take one tablet (20 mg) by mouth every morning, may also take one tablet in the afternoon as needed for swelling), Disp: 40 tablet, Rfl: 3 .  traZODone (DESYREL) 50 MG tablet, TAKE 1 TABLET(50 MG) BY MOUTH AT BEDTIME, Disp: 90 tablet, Rfl: 1 .  vitamin B-12 (CYANOCOBALAMIN) 500 MCG tablet, Take 500 mcg by mouth every other day., Disp: , Rfl:  .  VYNDAQEL 20 MG CAPS, Take 80 mg by mouth daily. (4 capsules) (Patient taking differently: Take 80 mg by mouth every evening. ), Disp: 120  capsule, Rfl:  .  zolpidem (AMBIEN) 5 MG tablet, Take 1 tablet (5 mg total) by mouth at bedtime., Disp: 30 tablet, Rfl: 2  Allergies: No Known Allergies  Past Medical History, Surgical history, Social history, and Family History were reviewed and updated.  Review of Systems: Review of Systems  Constitutional: Positive for appetite change and fatigue.  HENT:  Negative.   Eyes: Negative.   Respiratory: Negative.   Cardiovascular: Negative.   Gastrointestinal: Positive for abdominal pain and nausea.  Endocrine: Negative.   Genitourinary: Negative.    Musculoskeletal: Negative.   Skin: Negative.   Neurological: Negative.   Hematological: Negative.     Physical Exam:  vitals were not taken for this visit.   Wt Readings from Last 3 Encounters:  02/03/18 180 lb 4 oz (81.8 kg)  01/15/18 181 lb (82.1 kg)  01/09/18 175 lb 11.3 oz (79.7 kg)    Physical Exam  Constitutional: He is oriented to person, place, and time.  HENT:  Head: Normocephalic and atraumatic.  Mouth/Throat: Oropharynx is clear and moist.  Eyes: Pupils are equal, round, and reactive to light. EOM are normal.  Neck: Normal range of motion.  Cardiovascular: Normal rate, regular rhythm and normal heart sounds.  Pulmonary/Chest: Effort normal and breath sounds normal.  Abdominal: Soft. Bowel sounds are normal.  Musculoskeletal: Normal range of motion. He exhibits no edema, tenderness or deformity.  Lymphadenopathy:    He has no cervical adenopathy.  Neurological: He is alert and oriented to person, place, and time.  Skin: Skin is warm and dry. No rash noted. No erythema.  Psychiatric: He has a normal mood and affect. His behavior is normal. Judgment and thought content normal.  Vitals reviewed.    Lab Results  Component Value Date   WBC 6.8 02/17/2018   HGB 12.7 (L) 02/17/2018   HCT 40.9 02/17/2018   MCV 99.3 02/17/2018   PLT 153 02/17/2018     Chemistry      Component Value Date/Time   NA 141  02/17/2018 1141   NA 148 (H) 06/12/2017 0000   K 4.1 02/17/2018 1141   CL 100 02/17/2018 1141   CO2 35 (H) 02/17/2018 1141   BUN 18 02/17/2018 1141   BUN 38 (H) 06/12/2017 0000   CREATININE 1.60 (H) 02/17/2018 1141      Component Value Date/Time   CALCIUM 9.2 02/17/2018 1141   ALKPHOS 201 (H) 02/17/2018 1141   AST 45 (H) 02/17/2018 1141   ALT 39 02/17/2018 1141   BILITOT 1.3 02/17/2018 1141       Impression and Plan: Mr. Trouten is a 77 year old white male.  He has kappa light chain disease.   For right now, we are just watching him.  His cardiac status is doing so well.  The cardiologist, as always, have really done a fantastic job to get his cardiac status better.  I will see him back in 3 months.  I think this would be very reasonable for follow-up.  Volanda Napoleon, MD 11/5/201912:49 PM

## 2018-02-18 LAB — IGG, IGA, IGM
IGM (IMMUNOGLOBULIN M), SRM: 564 mg/dL — AB (ref 15–143)
IgA: 263 mg/dL (ref 61–437)
IgG (Immunoglobin G), Serum: 1118 mg/dL (ref 700–1600)

## 2018-02-18 LAB — IRON AND TIBC
Iron: 82 ug/dL (ref 42–163)
Saturation Ratios: 26 % (ref 20–55)
TIBC: 319 ug/dL (ref 202–409)
UIBC: 237 ug/dL (ref 117–376)

## 2018-02-18 LAB — KAPPA/LAMBDA LIGHT CHAINS
Kappa free light chain: 84.3 mg/L — ABNORMAL HIGH (ref 3.3–19.4)
Kappa, lambda light chain ratio: 2.59 — ABNORMAL HIGH (ref 0.26–1.65)
LAMDA FREE LIGHT CHAINS: 32.5 mg/L — AB (ref 5.7–26.3)

## 2018-02-18 LAB — FERRITIN: FERRITIN: 82 ng/mL (ref 24–336)

## 2018-02-19 DIAGNOSIS — L89151 Pressure ulcer of sacral region, stage 1: Secondary | ICD-10-CM | POA: Diagnosis not present

## 2018-02-19 DIAGNOSIS — I4891 Unspecified atrial fibrillation: Secondary | ICD-10-CM | POA: Diagnosis not present

## 2018-02-19 DIAGNOSIS — E119 Type 2 diabetes mellitus without complications: Secondary | ICD-10-CM | POA: Diagnosis not present

## 2018-02-19 DIAGNOSIS — Z79899 Other long term (current) drug therapy: Secondary | ICD-10-CM | POA: Diagnosis not present

## 2018-02-19 DIAGNOSIS — Z7901 Long term (current) use of anticoagulants: Secondary | ICD-10-CM | POA: Diagnosis not present

## 2018-02-19 DIAGNOSIS — D631 Anemia in chronic kidney disease: Secondary | ICD-10-CM | POA: Diagnosis not present

## 2018-02-19 DIAGNOSIS — Z950 Presence of cardiac pacemaker: Secondary | ICD-10-CM | POA: Diagnosis not present

## 2018-02-19 DIAGNOSIS — I5043 Acute on chronic combined systolic (congestive) and diastolic (congestive) heart failure: Secondary | ICD-10-CM | POA: Diagnosis not present

## 2018-02-19 DIAGNOSIS — I429 Cardiomyopathy, unspecified: Secondary | ICD-10-CM | POA: Diagnosis not present

## 2018-02-19 DIAGNOSIS — E1122 Type 2 diabetes mellitus with diabetic chronic kidney disease: Secondary | ICD-10-CM | POA: Diagnosis not present

## 2018-02-19 DIAGNOSIS — N183 Chronic kidney disease, stage 3 (moderate): Secondary | ICD-10-CM | POA: Diagnosis not present

## 2018-02-19 DIAGNOSIS — C9000 Multiple myeloma not having achieved remission: Secondary | ICD-10-CM | POA: Diagnosis not present

## 2018-02-19 DIAGNOSIS — I130 Hypertensive heart and chronic kidney disease with heart failure and stage 1 through stage 4 chronic kidney disease, or unspecified chronic kidney disease: Secondary | ICD-10-CM | POA: Diagnosis not present

## 2018-02-19 LAB — IMMUNOFIXATION REFLEX, SERUM
IgA: 269 mg/dL (ref 61–437)
IgG (Immunoglobin G), Serum: 1104 mg/dL (ref 700–1600)
IgM (Immunoglobulin M), Srm: 569 mg/dL — ABNORMAL HIGH (ref 15–143)

## 2018-02-19 LAB — PROTEIN ELECTROPHORESIS, SERUM, WITH REFLEX
A/G RATIO SPE: 1.1 (ref 0.7–1.7)
ALBUMIN ELP: 3.4 g/dL (ref 2.9–4.4)
Alpha-1-Globulin: 0.3 g/dL (ref 0.0–0.4)
Alpha-2-Globulin: 0.6 g/dL (ref 0.4–1.0)
BETA GLOBULIN: 0.9 g/dL (ref 0.7–1.3)
Gamma Globulin: 1.4 g/dL (ref 0.4–1.8)
Globulin, Total: 3.2 g/dL (ref 2.2–3.9)
M-Spike, %: 0.6 g/dL — ABNORMAL HIGH
SPEP INTERP: 0
TOTAL PROTEIN ELP: 6.6 g/dL (ref 6.0–8.5)

## 2018-02-23 LAB — UPEP/UIFE/LIGHT CHAINS/TP, 24-HR UR
% BETA, URINE: 13.2 %
ALBUMIN, U: 66.5 %
ALPHA 1 URINE: 3.1 %
ALPHA 2 UR: 6.8 %
FREE KAPPA LT CHAINS, UR: 140 mg/L — AB (ref 1.35–24.19)
Free Kappa/Lambda Ratio: 14.94 — ABNORMAL HIGH (ref 2.04–10.37)
Free Lambda Lt Chains,Ur: 9.37 mg/L — ABNORMAL HIGH (ref 0.24–6.66)
GAMMA GLOBULIN URINE: 10.4 %
M-SPIKE %, Urine: 3.5 % — ABNORMAL HIGH
M-SPIKE, MG/24 HR: 14 mg/(24.h) — AB
Total Protein, Urine-Ur/day: 408 mg/24 hr — ABNORMAL HIGH (ref 30–150)
Total Protein, Urine: 27.2 mg/dL
Total Volume: 1500

## 2018-02-24 DIAGNOSIS — I5043 Acute on chronic combined systolic (congestive) and diastolic (congestive) heart failure: Secondary | ICD-10-CM | POA: Diagnosis not present

## 2018-02-24 DIAGNOSIS — E119 Type 2 diabetes mellitus without complications: Secondary | ICD-10-CM | POA: Diagnosis not present

## 2018-02-24 DIAGNOSIS — N183 Chronic kidney disease, stage 3 (moderate): Secondary | ICD-10-CM | POA: Diagnosis not present

## 2018-02-26 DIAGNOSIS — E1122 Type 2 diabetes mellitus with diabetic chronic kidney disease: Secondary | ICD-10-CM | POA: Diagnosis not present

## 2018-02-26 DIAGNOSIS — L89151 Pressure ulcer of sacral region, stage 1: Secondary | ICD-10-CM | POA: Diagnosis not present

## 2018-02-26 DIAGNOSIS — E119 Type 2 diabetes mellitus without complications: Secondary | ICD-10-CM | POA: Diagnosis not present

## 2018-02-26 DIAGNOSIS — I4891 Unspecified atrial fibrillation: Secondary | ICD-10-CM | POA: Diagnosis not present

## 2018-02-26 DIAGNOSIS — C9000 Multiple myeloma not having achieved remission: Secondary | ICD-10-CM | POA: Diagnosis not present

## 2018-02-26 DIAGNOSIS — D631 Anemia in chronic kidney disease: Secondary | ICD-10-CM | POA: Diagnosis not present

## 2018-02-26 DIAGNOSIS — I130 Hypertensive heart and chronic kidney disease with heart failure and stage 1 through stage 4 chronic kidney disease, or unspecified chronic kidney disease: Secondary | ICD-10-CM | POA: Diagnosis not present

## 2018-02-26 DIAGNOSIS — N183 Chronic kidney disease, stage 3 (moderate): Secondary | ICD-10-CM | POA: Diagnosis not present

## 2018-02-26 DIAGNOSIS — I5043 Acute on chronic combined systolic (congestive) and diastolic (congestive) heart failure: Secondary | ICD-10-CM | POA: Diagnosis not present

## 2018-02-26 DIAGNOSIS — I429 Cardiomyopathy, unspecified: Secondary | ICD-10-CM | POA: Diagnosis not present

## 2018-03-05 DIAGNOSIS — E119 Type 2 diabetes mellitus without complications: Secondary | ICD-10-CM | POA: Diagnosis not present

## 2018-03-05 DIAGNOSIS — C9000 Multiple myeloma not having achieved remission: Secondary | ICD-10-CM | POA: Diagnosis not present

## 2018-03-05 DIAGNOSIS — I429 Cardiomyopathy, unspecified: Secondary | ICD-10-CM | POA: Diagnosis not present

## 2018-03-05 DIAGNOSIS — E1122 Type 2 diabetes mellitus with diabetic chronic kidney disease: Secondary | ICD-10-CM | POA: Diagnosis not present

## 2018-03-05 DIAGNOSIS — I130 Hypertensive heart and chronic kidney disease with heart failure and stage 1 through stage 4 chronic kidney disease, or unspecified chronic kidney disease: Secondary | ICD-10-CM | POA: Diagnosis not present

## 2018-03-05 DIAGNOSIS — I4891 Unspecified atrial fibrillation: Secondary | ICD-10-CM | POA: Diagnosis not present

## 2018-03-05 DIAGNOSIS — L89151 Pressure ulcer of sacral region, stage 1: Secondary | ICD-10-CM | POA: Diagnosis not present

## 2018-03-05 DIAGNOSIS — D631 Anemia in chronic kidney disease: Secondary | ICD-10-CM | POA: Diagnosis not present

## 2018-03-05 DIAGNOSIS — N183 Chronic kidney disease, stage 3 (moderate): Secondary | ICD-10-CM | POA: Diagnosis not present

## 2018-03-05 DIAGNOSIS — I5043 Acute on chronic combined systolic (congestive) and diastolic (congestive) heart failure: Secondary | ICD-10-CM | POA: Diagnosis not present

## 2018-03-10 DIAGNOSIS — I8510 Secondary esophageal varices without bleeding: Secondary | ICD-10-CM | POA: Diagnosis not present

## 2018-03-10 DIAGNOSIS — K7460 Unspecified cirrhosis of liver: Secondary | ICD-10-CM | POA: Diagnosis not present

## 2018-03-10 DIAGNOSIS — R110 Nausea: Secondary | ICD-10-CM | POA: Diagnosis not present

## 2018-03-11 DIAGNOSIS — E1122 Type 2 diabetes mellitus with diabetic chronic kidney disease: Secondary | ICD-10-CM | POA: Diagnosis not present

## 2018-03-11 DIAGNOSIS — D631 Anemia in chronic kidney disease: Secondary | ICD-10-CM | POA: Diagnosis not present

## 2018-03-11 DIAGNOSIS — I429 Cardiomyopathy, unspecified: Secondary | ICD-10-CM | POA: Diagnosis not present

## 2018-03-11 DIAGNOSIS — C9000 Multiple myeloma not having achieved remission: Secondary | ICD-10-CM | POA: Diagnosis not present

## 2018-03-11 DIAGNOSIS — I5043 Acute on chronic combined systolic (congestive) and diastolic (congestive) heart failure: Secondary | ICD-10-CM | POA: Diagnosis not present

## 2018-03-11 DIAGNOSIS — L89151 Pressure ulcer of sacral region, stage 1: Secondary | ICD-10-CM | POA: Diagnosis not present

## 2018-03-11 DIAGNOSIS — N183 Chronic kidney disease, stage 3 (moderate): Secondary | ICD-10-CM | POA: Diagnosis not present

## 2018-03-11 DIAGNOSIS — I4891 Unspecified atrial fibrillation: Secondary | ICD-10-CM | POA: Diagnosis not present

## 2018-03-11 DIAGNOSIS — I130 Hypertensive heart and chronic kidney disease with heart failure and stage 1 through stage 4 chronic kidney disease, or unspecified chronic kidney disease: Secondary | ICD-10-CM | POA: Diagnosis not present

## 2018-03-12 DIAGNOSIS — E119 Type 2 diabetes mellitus without complications: Secondary | ICD-10-CM | POA: Diagnosis not present

## 2018-03-12 DIAGNOSIS — N183 Chronic kidney disease, stage 3 (moderate): Secondary | ICD-10-CM | POA: Diagnosis not present

## 2018-03-12 DIAGNOSIS — I5043 Acute on chronic combined systolic (congestive) and diastolic (congestive) heart failure: Secondary | ICD-10-CM | POA: Diagnosis not present

## 2018-03-16 ENCOUNTER — Ambulatory Visit (HOSPITAL_COMMUNITY)
Admission: RE | Admit: 2018-03-16 | Discharge: 2018-03-16 | Disposition: A | Discharge status: Home / Self Care | Payer: Medicare HMO | Source: Ambulatory Visit | Attending: Internal Medicine | Admitting: Internal Medicine

## 2018-03-16 ENCOUNTER — Encounter (HOSPITAL_COMMUNITY): Payer: Self-pay | Admitting: Internal Medicine

## 2018-03-16 VITALS — BP 130/72 | HR 87 | Wt 184.8 lb

## 2018-03-16 DIAGNOSIS — I130 Hypertensive heart and chronic kidney disease with heart failure and stage 1 through stage 4 chronic kidney disease, or unspecified chronic kidney disease: Secondary | ICD-10-CM | POA: Insufficient documentation

## 2018-03-16 DIAGNOSIS — I480 Paroxysmal atrial fibrillation: Secondary | ICD-10-CM

## 2018-03-16 DIAGNOSIS — Z85828 Personal history of other malignant neoplasm of skin: Secondary | ICD-10-CM | POA: Diagnosis not present

## 2018-03-16 DIAGNOSIS — C9000 Multiple myeloma not having achieved remission: Secondary | ICD-10-CM | POA: Insufficient documentation

## 2018-03-16 DIAGNOSIS — Z66 Do not resuscitate: Secondary | ICD-10-CM | POA: Insufficient documentation

## 2018-03-16 DIAGNOSIS — Z8249 Family history of ischemic heart disease and other diseases of the circulatory system: Secondary | ICD-10-CM | POA: Diagnosis not present

## 2018-03-16 DIAGNOSIS — I5042 Chronic combined systolic (congestive) and diastolic (congestive) heart failure: Secondary | ICD-10-CM

## 2018-03-16 DIAGNOSIS — I428 Other cardiomyopathies: Secondary | ICD-10-CM | POA: Diagnosis not present

## 2018-03-16 DIAGNOSIS — E854 Organ-limited amyloidosis: Secondary | ICD-10-CM

## 2018-03-16 DIAGNOSIS — I4510 Unspecified right bundle-branch block: Secondary | ICD-10-CM | POA: Diagnosis not present

## 2018-03-16 DIAGNOSIS — N183 Chronic kidney disease, stage 3 unspecified: Secondary | ICD-10-CM

## 2018-03-16 DIAGNOSIS — K7581 Nonalcoholic steatohepatitis (NASH): Secondary | ICD-10-CM | POA: Insufficient documentation

## 2018-03-16 DIAGNOSIS — Z950 Presence of cardiac pacemaker: Secondary | ICD-10-CM | POA: Insufficient documentation

## 2018-03-16 DIAGNOSIS — K2270 Barrett's esophagus without dysplasia: Secondary | ICD-10-CM | POA: Diagnosis not present

## 2018-03-16 DIAGNOSIS — E785 Hyperlipidemia, unspecified: Secondary | ICD-10-CM | POA: Diagnosis not present

## 2018-03-16 DIAGNOSIS — I43 Cardiomyopathy in diseases classified elsewhere: Secondary | ICD-10-CM

## 2018-03-16 DIAGNOSIS — M1990 Unspecified osteoarthritis, unspecified site: Secondary | ICD-10-CM | POA: Diagnosis not present

## 2018-03-16 DIAGNOSIS — Z79899 Other long term (current) drug therapy: Secondary | ICD-10-CM | POA: Insufficient documentation

## 2018-03-16 DIAGNOSIS — I4892 Unspecified atrial flutter: Secondary | ICD-10-CM | POA: Insufficient documentation

## 2018-03-16 DIAGNOSIS — I4891 Unspecified atrial fibrillation: Secondary | ICD-10-CM | POA: Diagnosis not present

## 2018-03-16 DIAGNOSIS — E1122 Type 2 diabetes mellitus with diabetic chronic kidney disease: Secondary | ICD-10-CM | POA: Diagnosis not present

## 2018-03-16 DIAGNOSIS — Z87891 Personal history of nicotine dependence: Secondary | ICD-10-CM | POA: Diagnosis not present

## 2018-03-16 DIAGNOSIS — Z7901 Long term (current) use of anticoagulants: Secondary | ICD-10-CM | POA: Insufficient documentation

## 2018-03-16 DIAGNOSIS — K219 Gastro-esophageal reflux disease without esophagitis: Secondary | ICD-10-CM | POA: Insufficient documentation

## 2018-03-16 DIAGNOSIS — E7800 Pure hypercholesterolemia, unspecified: Secondary | ICD-10-CM | POA: Insufficient documentation

## 2018-03-16 NOTE — Progress Notes (Signed)
Advanced Heart Failure Clinic Note   Referring Physician: PCP: [name], MD PCP-Cardiologist: Glenetta Hew, MD  Primary HF: Dr. [name]   HPI: Dillon Andrews is a 77 y.o. malewith a hx of chronic combined CHF due to NICM, CKD III,presumedNASH(per pt, never proven),esophageal varices per chart(prior glass of wine occasionally but no heavy alcohol),HTN, HLD, DM, Barrett's esophagus, GERD, anemia, RBBB  He was admitted 3/13-3/17/19 for A/C combined HF. While inpatient, SPEP and UPEP sent for concerns for cardiac amyloidosis. He diuresed with IV lasix and was transitioned to lasix 80 mg q am, 40 mg q pm. DC weight 180 lbs.   NICM by cath at [facility] 09/2016 with EF 40-45%. Echo 06/2017: EF 25-30%,NICM by cath at [facility] 09/2016 with EF 40-45%. Echo 06/2017: EF 25-30%,  His SPEP came back with m-spike 0.8% and he was referred to Dr Beryle Beams. UPEP with Bence Jones Protein positive; kappa type.   PYP scan completed on 3/20 strongly suggestive of transthyretin amyloidosis. Genetic testing negative.  Seen in clinic on 3/26 and transitioned from lasix to torsemide. Admitted 3/28-3/31 with URI and A/C HF. URI treated with antibiotics. Diuresed with IV lasix, then transitioned to torsemide 40 mg BID. Genetic testing sent.   Admitted 8/67-6/72/09 with A/C systolic HF. He was diuresed with IV lasix. He required milrinone for low output. He was in afib RVR and was cardioverted twice. He was treated for sepsis. He had AKI on CKD that resolved prior to discharge. He also had rectal bleeding and required 1 unit RBCs. Eliquis was held and then dose was reduced. Palliative met with him and he was made DNR/I. Discharged home with milrinone.   Admitted 8/20-8/22/19 with fever and presumed HCAP. PCT was negative. He did not need abx at discharge. HF team consulted. He was stable from HF perspective. Continued milrinone, torsemide, and tafamidis. He was back in rate controlled atrial flutter, but  was asymptomatic. DC weight 174 lbs.   Admitted 9/24 after he had syncopal event.  CT of head was negative for bleed. EKG showed complete heart block. Amiodarone was stopped. EP consulted and on 9/26 he underwent Medtronic PPM. Eliquis was restarted on 01/09/2018.  Discharge weight 175 pounds.   He presents today for regular follow up. Up 4 lbs from last visit, but feeling great. He quit amiodarone after 11 days with recurrent nausea. Walks with a rollator on occasion, but walking more and more without it. Drove here today. No SOB walking with rollator, but + bendopnea. Mild orthopnea, but resolves with repositioning. No PND. Sleeps with HOB elevated. Mild lightheadedness with rapid standing from bending over. Occasional dizziness with head turning. Has been quite a long time since he has needed extra diuretics.   Review of systems complete and found to be negative unless listed in HPI.   Past Medical History:  Diagnosis Date  . Anemia 11/2016  . Arthritis    "some; hands/fingers" (06/25/2017)  . Atrial tachycardia ([place])   . Barrett's esophagus   . Basal cell carcinoma    "face, arms, hands" (06/25/2017)  . Chicken pox   . Cholelithiasis 11/2016   S/P LAP CHOLE  . Chronic combined systolic and diastolic CHF (congestive heart failure) ([place])   . CKD (chronic kidney disease), stage III ([place])   . Esophageal varices (HCC)    Secondary to NAFLD  . Family history of adverse reaction to anesthesia    "daughter's sat drop very low; last OR had to be reintubated" (06/25/2017)  . GERD (  gastroesophageal reflux disease)   . Goals of care, counseling/discussion 10/22/2017  . High cholesterol   . Hypertension   . Kappa light chain myeloma ([place]) 10/22/2017  . Mitral regurgitation   . NAFLD (nonalcoholic fatty liver disease)   . NICM (nonischemic cardiomyopathy) ([place])    Echo 06/2017: Severe LVH.  EF 25 to 30% with mild HK of the baseline after septal wall.  Moderate to severely dilated right atrium,  moderately dilated RV. (Concern fr ? amyloidosis).  . NSVT (nonsustained ventricular tachycardia) ([place])   . Pneumonia 09/2016  . RBBB   . Tricuspid regurgitation   . Type II diabetes mellitus ([place])     Current Outpatient Medications  Medication Sig Dispense Refill  . apixaban (ELIQUIS) 2.5 MG TABS tablet Take 1 tablet (2.5 mg total) by mouth 2 (two) times daily. 60 tablet 11  . dronabinol (MARINOL) 5 MG capsule Take 1 capsule (5 mg total) by mouth 2 (two) times daily before lunch and supper. 60 capsule 2  . gabapentin (NEURONTIN) 800 MG tablet TAKE 1 TABLET BY MOUTH AT BEDTIME (Patient taking differently: Take 400 mg by mouth at bedtime. ) 90 tablet 1  . glucose blood test strip Use as instructed 100 each 12  . Melatonin 5 MG TABS Take 5 mg by mouth at bedtime.     . milrinone (PRIMACOR) 20 MG/100 ML SOLN infusion Inject 0.0099 mg/min into the vein continuous. 100 mL 0  . omeprazole (PRILOSEC) 40 MG capsule TAKE 1 CAPSULE(40 MG) BY MOUTH DAILY 90 capsule 1  . ondansetron (ZOFRAN) 4 MG tablet Take 1 tablet (4 mg total) by mouth every 8 (eight) hours as needed for nausea or vomiting. 30 tablet 2  . polyethylene glycol (MIRALAX / GLYCOLAX) packet Take 17 g by mouth daily. (Patient taking differently: Take 17 g by mouth daily as needed. ) 14 each 0  . potassium chloride SA (K-DUR,KLOR-CON) 20 MEQ tablet Take 20 mEq by mouth daily.     . rosuvastatin (CRESTOR) 10 MG tablet TAKE 1 TABLET BY MOUTH ONCE DAILY 90 tablet 1  . senna-docusate (SENOKOT-S) 8.6-50 MG tablet Take 2 tablets by mouth 2 (two) times daily. (Patient taking differently: Take 1 tablet by mouth See admin instructions. Take one tablet by mouth every morning, may take another tablet in the evening as needed for constipation) 20 tablet 0  . sertraline (ZOLOFT) 25 MG tablet TAKE 1 TABLET BY MOUTH EVERY DAY (Patient taking differently: Take 25 mg by mouth daily. ) 90 tablet 0  . torsemide (DEMADEX) 20 MG tablet Take 1 tablet (20 mg total)  by mouth daily. Can take 20 mg extra as needed. (Patient taking differently: Take 20 mg by mouth See admin instructions. Take one tablet (20 mg) by mouth every morning, may also take one tablet in the afternoon as needed for swelling) 40 tablet 3  . traZODone (DESYREL) 50 MG tablet TAKE 1 TABLET(50 MG) BY MOUTH AT BEDTIME 90 tablet 1  . vitamin B-12 (CYANOCOBALAMIN) 500 MCG tablet Take 500 mcg by mouth every other day.    Marland Kitchen VYNDAQEL 20 MG CAPS Take 80 mg by mouth daily. (4 capsules) (Patient taking differently: Take 80 mg by mouth every evening. ) 120 capsule   . zolpidem (AMBIEN) 5 MG tablet Take 1 tablet (5 mg total) by mouth at bedtime. 30 tablet 2  . hydrocortisone (ANUSOL-HC) 25 MG suppository Place 1 suppository (25 mg total) rectally 2 (two) times daily. (Patient not taking: Reported on 03/16/2018) 12 suppository  0   No current facility-administered medications for this encounter.     No Known Allergies    Social History   Socioeconomic History  . Marital status: Married    Spouse name: Not on file  . Number of children: 4  . Years of education: 63  . Highest education level: Not on file  Occupational History  . Occupation: retired  Scientific laboratory technician  . Financial resource strain: Not on file  . Food insecurity:    Worry: Not on file    Inability: Not on file  . Transportation needs:    Medical: Not on file    Non-medical: Not on file  Tobacco Use  . Smoking status: Former Smoker    Packs/day: 0.30    Years: 15.00    Pack years: 4.50    Types: Cigarettes    Last attempt to quit: 03/17/1977    Years since quitting: 41.0  . Smokeless tobacco: Never Used  Substance and Sexual Activity  . Alcohol use: Yes    Comment: 06/25/2017 "nothing since 09/2016;  used to have glass of wine q hs"  . Drug use: No  . Sexual activity: Not on file  Lifestyle  . Physical activity:    Days per week: Not on file    Minutes per session: Not on file  . Stress: Not on file  Relationships  .  Social connections:    Talks on phone: Not on file    Gets together: Not on file    Attends religious service: Not on file    Active member of club or organization: Not on file    Attends meetings of clubs or organizations: Not on file    Relationship status: Not on file  . Intimate partner violence:    Fear of current or ex partner: Not on file    Emotionally abused: Not on file    Physically abused: Not on file    Forced sexual activity: Not on file  Other Topics Concern  . Not on file  Social History Narrative   He is retired from [employer].  Previously worked for [employer] by his daughter --> also a patient of Dr. Ellyn Hack   Lives with wife in a one story home.    Education: high school.       Family History  Problem Relation Age of Onset  . Heart failure Mother   . Cancer Father   . Cataracts Father   . Breast cancer Sister   . Alcohol abuse Brother   . Liver disease Brother   . Autoimmune disease Daughter        Bechet's syndrome  . Cirrhosis Neg Hx    Vitals:   03/16/18 1521 03/16/18 1526  BP: (!) 148/86 130/72  Pulse: 87   SpO2: 93%   Weight: 83.8 kg (184 lb 12.8 oz)     Wt Readings from Last 3 Encounters:  03/16/18 83.8 kg (184 lb 12.8 oz)  02/03/18 81.8 kg (180 lb 4 oz)  01/15/18 82.1 kg (181 lb)    PHYSICAL EXAM: General: NAD  HEENT: Normal anicteric Neck: Supple. JVP 6-7 cm. Carotids 2+ bilat; no bruits. No thyromegaly or nodule noted. Cor: PMI nondisplaced. RRR, No M/G/R noted. R upper chest tunneled cath. Lt upper chest PPM. Stable. General:  Well appearing. No resp difficulty Lungs: clear Abdomen: soft, nontender, nondistended. No hepatosplenomegaly. No bruits or masses. Good bowel sounds. Extremities: no cyanosis, clubbing, rash, edema Neuro: alert &  orientedx3, cranial nerves grossly intact. moves all 4 extremities w/o difficulty. Affect pleasant   ECG: Sinus 82 with v-pacing Personally reviewed   ASSESSMENT &  PLAN:   1. Chronic combined HF with TTR amyloidosis (wild type) -NICM by cath at [facility] 09/2016 with EF 40-45%.  - Echo 06/2017: EF 25-30%, mild LAE, mild/mod RV dilation, mod/severe RA. Echo 10/2017: EF 10% - Plan to repeat Echo next month to re-assess EF.  -PYP scan strongly suggestive of TTR amyloid, SPEP with 0.8% m-spike, UPEP with Bence Jones Protein positive, kappa type. 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 - Dr Marin Olp saw and thinks he does not have AL amyloid (just multiple myeloma) and that he has TTR amyloid and should be treated with tafamidis before chemo. Tafamidis started on 8/2.  - NYHA II symptoms - Volume status stable on exam.  - Continue torsemide 20 mg daily with extra 20 mg as needed.  - Continue milrinone 0.125 mcg. Managed by [facility] - Continue tafamidis 80 mg daily - No ARB/entresto/spiro with for now with CKD. Creatinine 1.42 10/18. - Have been hydralazine/imdur  2. CKD III, baseline ~1.6 - Creatinine 1.6 on 02/17/18. [facility] drawing q2 weeks. No more recent labs available in system. (pending)  3. Atrial Flutter s/p DCCV 7/31, 8/2, 12/19/2017 -> junctional rhythm with symptomatic bradycardia -> MDT PPM - Off amiodarone with recurrent nausea.   - Continue Eliquis 2.5 mg BID. Denies bleeding.   4. HL - Continue crestor.  5. [place] with Dr. Marin Olp  -Started on Bortezomib7/18/18andRevlimid7/23/19. - Dr Marin Olp thinks he does not have AL amyloid and that he does have TTR amyloid and should be treated with tafamidis before chemo. Now on Tafamidis.  - Continue follow up.   6. NASH with esophageal varices.  - Last EGD3/8/2016at [facility] surveillance of Esophageal varices, Barrett's esophagus, and PMHx of Colon polyps. Showed two columns of small esophageal varices w/out stigmata, short segment Barrett's esophagus (without biopsies, due to presence of EVs), 5cm hiatal hernia, and hyperplastic small gastric nodules.  -  Continue Eliquis 2.5 mg BID for afib. Denies bleeding.   7. Hx of Rectal bleeding - EGD/Colon deferred with recent (2017) showing diverticulosis and hemorrhoids. - Denies bleeding.    8. Syncope,CHB - S/P Medtronic PPM.  - Stable.   Shirley Friar, PA-C 03/16/18   Patient seen and examined with the above-signed Advanced Practice Provider and/or Housestaff. I personally reviewed laboratory data, imaging studies and relevant notes. I independently examined the patient and formulated the important aspects of the plan. I have edited the note to reflect any of my changes or salient points. I have personally discussed the plan with the patient and/or family.  Overall much improved. NYHA II on milrinone. Volume status looks ok. I did bedside echo personally and EF 20-25%. Remains in NSR. Unable to tolerate amio. Will continue current regimen for now. Continue tafamadis for TTR amyloid. If doing well in January can try to wean milrinone slowly.   Glori Bickers, MD  4:12 PM

## 2018-03-17 ENCOUNTER — Other Ambulatory Visit: Payer: Self-pay | Admitting: Hematology & Oncology

## 2018-03-17 ENCOUNTER — Other Ambulatory Visit: Payer: Self-pay | Admitting: Family Medicine

## 2018-03-17 DIAGNOSIS — C9000 Multiple myeloma not having achieved remission: Secondary | ICD-10-CM

## 2018-03-17 DIAGNOSIS — Z7189 Other specified counseling: Secondary | ICD-10-CM

## 2018-03-17 DIAGNOSIS — J029 Acute pharyngitis, unspecified: Secondary | ICD-10-CM

## 2018-03-17 DIAGNOSIS — R110 Nausea: Secondary | ICD-10-CM

## 2018-03-17 DIAGNOSIS — R778 Other specified abnormalities of plasma proteins: Secondary | ICD-10-CM

## 2018-03-17 MED ORDER — ONDANSETRON HCL 4 MG PO TABS: 4.0000 mg | ORAL_TABLET | Freq: Three times a day (TID) | ORAL | 2 refills | Status: DC | PRN

## 2018-03-17 NOTE — Telephone Encounter (Signed)
Copied from [place] [id number]. Topic: Quick Communication - Rx Refill/Question >> Mar 17, 2018 11:30 AM Windy Kalata wrote: Medication: ondansetron (ZOFRAN) 4 MG tablet 30 tablet    Sig - Route: Take 1 tablet (4 mg total) by mouth every 8 (eight) hours as needed for nausea or vomiting.      Has the patient contacted their pharmacy? No. (Agent: If no, request that the patient contact the pharmacy for the refill.) (Agent: If yes, when and what did the pharmacy advise?)  Preferred Pharmacy (with phone number or street name): [id number] [address], [place] [phone] (Phone) [phone] (Fax)    Agent: Please be advised that RX refills may take up to 3 business days. We ask that you follow-up with your pharmacy.

## 2018-03-19 ENCOUNTER — Other Ambulatory Visit (HOSPITAL_COMMUNITY): Payer: Self-pay | Admitting: Internal Medicine

## 2018-03-19 ENCOUNTER — Other Ambulatory Visit: Payer: Self-pay | Admitting: Family Medicine

## 2018-03-19 DIAGNOSIS — E1122 Type 2 diabetes mellitus with diabetic chronic kidney disease: Secondary | ICD-10-CM | POA: Diagnosis not present

## 2018-03-19 DIAGNOSIS — C9000 Multiple myeloma not having achieved remission: Secondary | ICD-10-CM | POA: Diagnosis not present

## 2018-03-19 DIAGNOSIS — D631 Anemia in chronic kidney disease: Secondary | ICD-10-CM | POA: Diagnosis not present

## 2018-03-19 DIAGNOSIS — N183 Chronic kidney disease, stage 3 (moderate): Secondary | ICD-10-CM | POA: Diagnosis not present

## 2018-03-19 DIAGNOSIS — L89151 Pressure ulcer of sacral region, stage 1: Secondary | ICD-10-CM | POA: Diagnosis not present

## 2018-03-19 DIAGNOSIS — I4891 Unspecified atrial fibrillation: Secondary | ICD-10-CM | POA: Diagnosis not present

## 2018-03-19 DIAGNOSIS — E119 Type 2 diabetes mellitus without complications: Secondary | ICD-10-CM | POA: Diagnosis not present

## 2018-03-19 DIAGNOSIS — I5043 Acute on chronic combined systolic (congestive) and diastolic (congestive) heart failure: Secondary | ICD-10-CM | POA: Diagnosis not present

## 2018-03-19 DIAGNOSIS — I429 Cardiomyopathy, unspecified: Secondary | ICD-10-CM | POA: Diagnosis not present

## 2018-03-19 DIAGNOSIS — I130 Hypertensive heart and chronic kidney disease with heart failure and stage 1 through stage 4 chronic kidney disease, or unspecified chronic kidney disease: Secondary | ICD-10-CM | POA: Diagnosis not present

## 2018-03-20 DIAGNOSIS — L89151 Pressure ulcer of sacral region, stage 1: Secondary | ICD-10-CM | POA: Diagnosis not present

## 2018-03-20 DIAGNOSIS — I429 Cardiomyopathy, unspecified: Secondary | ICD-10-CM | POA: Diagnosis not present

## 2018-03-20 DIAGNOSIS — N183 Chronic kidney disease, stage 3 (moderate): Secondary | ICD-10-CM | POA: Diagnosis not present

## 2018-03-20 DIAGNOSIS — C9000 Multiple myeloma not having achieved remission: Secondary | ICD-10-CM | POA: Diagnosis not present

## 2018-03-20 DIAGNOSIS — D631 Anemia in chronic kidney disease: Secondary | ICD-10-CM | POA: Diagnosis not present

## 2018-03-20 DIAGNOSIS — I4891 Unspecified atrial fibrillation: Secondary | ICD-10-CM | POA: Diagnosis not present

## 2018-03-20 DIAGNOSIS — I130 Hypertensive heart and chronic kidney disease with heart failure and stage 1 through stage 4 chronic kidney disease, or unspecified chronic kidney disease: Secondary | ICD-10-CM | POA: Diagnosis not present

## 2018-03-20 DIAGNOSIS — I5043 Acute on chronic combined systolic (congestive) and diastolic (congestive) heart failure: Secondary | ICD-10-CM | POA: Diagnosis not present

## 2018-03-20 DIAGNOSIS — E1122 Type 2 diabetes mellitus with diabetic chronic kidney disease: Secondary | ICD-10-CM | POA: Diagnosis not present

## 2018-03-26 DIAGNOSIS — D631 Anemia in chronic kidney disease: Secondary | ICD-10-CM | POA: Diagnosis not present

## 2018-03-26 DIAGNOSIS — N183 Chronic kidney disease, stage 3 (moderate): Secondary | ICD-10-CM | POA: Diagnosis not present

## 2018-03-26 DIAGNOSIS — I4891 Unspecified atrial fibrillation: Secondary | ICD-10-CM | POA: Diagnosis not present

## 2018-03-26 DIAGNOSIS — L89151 Pressure ulcer of sacral region, stage 1: Secondary | ICD-10-CM | POA: Diagnosis not present

## 2018-03-26 DIAGNOSIS — E119 Type 2 diabetes mellitus without complications: Secondary | ICD-10-CM | POA: Diagnosis not present

## 2018-03-26 DIAGNOSIS — I5043 Acute on chronic combined systolic (congestive) and diastolic (congestive) heart failure: Secondary | ICD-10-CM | POA: Diagnosis not present

## 2018-03-26 DIAGNOSIS — I130 Hypertensive heart and chronic kidney disease with heart failure and stage 1 through stage 4 chronic kidney disease, or unspecified chronic kidney disease: Secondary | ICD-10-CM | POA: Diagnosis not present

## 2018-03-26 DIAGNOSIS — E1122 Type 2 diabetes mellitus with diabetic chronic kidney disease: Secondary | ICD-10-CM | POA: Diagnosis not present

## 2018-03-26 DIAGNOSIS — I429 Cardiomyopathy, unspecified: Secondary | ICD-10-CM | POA: Diagnosis not present

## 2018-03-26 DIAGNOSIS — C9000 Multiple myeloma not having achieved remission: Secondary | ICD-10-CM | POA: Diagnosis not present

## 2018-03-27 ENCOUNTER — Other Ambulatory Visit (HOSPITAL_COMMUNITY): Payer: Self-pay | Admitting: Internal Medicine

## 2018-04-02 DIAGNOSIS — I429 Cardiomyopathy, unspecified: Secondary | ICD-10-CM | POA: Diagnosis not present

## 2018-04-02 DIAGNOSIS — D631 Anemia in chronic kidney disease: Secondary | ICD-10-CM | POA: Diagnosis not present

## 2018-04-02 DIAGNOSIS — N183 Chronic kidney disease, stage 3 (moderate): Secondary | ICD-10-CM | POA: Diagnosis not present

## 2018-04-02 DIAGNOSIS — I130 Hypertensive heart and chronic kidney disease with heart failure and stage 1 through stage 4 chronic kidney disease, or unspecified chronic kidney disease: Secondary | ICD-10-CM | POA: Diagnosis not present

## 2018-04-02 DIAGNOSIS — C9000 Multiple myeloma not having achieved remission: Secondary | ICD-10-CM | POA: Diagnosis not present

## 2018-04-02 DIAGNOSIS — I5043 Acute on chronic combined systolic (congestive) and diastolic (congestive) heart failure: Secondary | ICD-10-CM | POA: Diagnosis not present

## 2018-04-02 DIAGNOSIS — E119 Type 2 diabetes mellitus without complications: Secondary | ICD-10-CM | POA: Diagnosis not present

## 2018-04-02 DIAGNOSIS — I4891 Unspecified atrial fibrillation: Secondary | ICD-10-CM | POA: Diagnosis not present

## 2018-04-02 DIAGNOSIS — E1122 Type 2 diabetes mellitus with diabetic chronic kidney disease: Secondary | ICD-10-CM | POA: Diagnosis not present

## 2018-04-02 DIAGNOSIS — K7460 Unspecified cirrhosis of liver: Secondary | ICD-10-CM | POA: Diagnosis not present

## 2018-04-03 ENCOUNTER — Other Ambulatory Visit (HOSPITAL_COMMUNITY): Payer: Self-pay | Admitting: Internal Medicine

## 2018-04-09 DIAGNOSIS — N183 Chronic kidney disease, stage 3 (moderate): Secondary | ICD-10-CM | POA: Diagnosis not present

## 2018-04-09 DIAGNOSIS — D631 Anemia in chronic kidney disease: Secondary | ICD-10-CM | POA: Diagnosis not present

## 2018-04-09 DIAGNOSIS — I130 Hypertensive heart and chronic kidney disease with heart failure and stage 1 through stage 4 chronic kidney disease, or unspecified chronic kidney disease: Secondary | ICD-10-CM | POA: Diagnosis not present

## 2018-04-09 DIAGNOSIS — C9000 Multiple myeloma not having achieved remission: Secondary | ICD-10-CM | POA: Diagnosis not present

## 2018-04-09 DIAGNOSIS — E1122 Type 2 diabetes mellitus with diabetic chronic kidney disease: Secondary | ICD-10-CM | POA: Diagnosis not present

## 2018-04-09 DIAGNOSIS — I429 Cardiomyopathy, unspecified: Secondary | ICD-10-CM | POA: Diagnosis not present

## 2018-04-09 DIAGNOSIS — E119 Type 2 diabetes mellitus without complications: Secondary | ICD-10-CM | POA: Diagnosis not present

## 2018-04-09 DIAGNOSIS — I5043 Acute on chronic combined systolic (congestive) and diastolic (congestive) heart failure: Secondary | ICD-10-CM | POA: Diagnosis not present

## 2018-04-09 DIAGNOSIS — I4891 Unspecified atrial fibrillation: Secondary | ICD-10-CM | POA: Diagnosis not present

## 2018-04-09 DIAGNOSIS — K7460 Unspecified cirrhosis of liver: Secondary | ICD-10-CM | POA: Diagnosis not present

## 2018-04-13 ENCOUNTER — Other Ambulatory Visit (HOSPITAL_COMMUNITY): Payer: Self-pay | Admitting: Internal Medicine

## 2018-04-13 DIAGNOSIS — I4891 Unspecified atrial fibrillation: Secondary | ICD-10-CM | POA: Diagnosis not present

## 2018-04-13 DIAGNOSIS — I429 Cardiomyopathy, unspecified: Secondary | ICD-10-CM | POA: Diagnosis not present

## 2018-04-13 DIAGNOSIS — K7460 Unspecified cirrhosis of liver: Secondary | ICD-10-CM | POA: Diagnosis not present

## 2018-04-13 DIAGNOSIS — N183 Chronic kidney disease, stage 3 (moderate): Secondary | ICD-10-CM | POA: Diagnosis not present

## 2018-04-13 DIAGNOSIS — E1122 Type 2 diabetes mellitus with diabetic chronic kidney disease: Secondary | ICD-10-CM | POA: Diagnosis not present

## 2018-04-13 DIAGNOSIS — D631 Anemia in chronic kidney disease: Secondary | ICD-10-CM | POA: Diagnosis not present

## 2018-04-13 DIAGNOSIS — I5043 Acute on chronic combined systolic (congestive) and diastolic (congestive) heart failure: Secondary | ICD-10-CM | POA: Diagnosis not present

## 2018-04-13 DIAGNOSIS — C9000 Multiple myeloma not having achieved remission: Secondary | ICD-10-CM | POA: Diagnosis not present

## 2018-04-13 DIAGNOSIS — I130 Hypertensive heart and chronic kidney disease with heart failure and stage 1 through stage 4 chronic kidney disease, or unspecified chronic kidney disease: Secondary | ICD-10-CM | POA: Diagnosis not present

## 2018-04-14 ENCOUNTER — Ambulatory Visit (HOSPITAL_COMMUNITY): Payer: Medicare HMO | Attending: Cardiology

## 2018-04-14 ENCOUNTER — Other Ambulatory Visit: Payer: Self-pay

## 2018-04-14 DIAGNOSIS — I5042 Chronic combined systolic (congestive) and diastolic (congestive) heart failure: Secondary | ICD-10-CM | POA: Diagnosis not present

## 2018-04-16 ENCOUNTER — Other Ambulatory Visit (HOSPITAL_COMMUNITY): Payer: Self-pay | Admitting: Cardiology

## 2018-04-16 ENCOUNTER — Telehealth (HOSPITAL_COMMUNITY): Payer: Self-pay

## 2018-04-16 ENCOUNTER — Other Ambulatory Visit: Payer: Self-pay | Admitting: Family Medicine

## 2018-04-16 DIAGNOSIS — I5042 Chronic combined systolic (congestive) and diastolic (congestive) heart failure: Secondary | ICD-10-CM

## 2018-04-16 DIAGNOSIS — K7460 Unspecified cirrhosis of liver: Secondary | ICD-10-CM | POA: Diagnosis not present

## 2018-04-16 DIAGNOSIS — N183 Chronic kidney disease, stage 3 (moderate): Secondary | ICD-10-CM | POA: Diagnosis not present

## 2018-04-16 DIAGNOSIS — G4701 Insomnia due to medical condition: Secondary | ICD-10-CM

## 2018-04-16 DIAGNOSIS — C9000 Multiple myeloma not having achieved remission: Secondary | ICD-10-CM | POA: Diagnosis not present

## 2018-04-16 DIAGNOSIS — I5043 Acute on chronic combined systolic (congestive) and diastolic (congestive) heart failure: Secondary | ICD-10-CM | POA: Diagnosis not present

## 2018-04-16 DIAGNOSIS — I4891 Unspecified atrial fibrillation: Secondary | ICD-10-CM | POA: Diagnosis not present

## 2018-04-16 DIAGNOSIS — I429 Cardiomyopathy, unspecified: Secondary | ICD-10-CM | POA: Diagnosis not present

## 2018-04-16 DIAGNOSIS — E1122 Type 2 diabetes mellitus with diabetic chronic kidney disease: Secondary | ICD-10-CM | POA: Diagnosis not present

## 2018-04-16 DIAGNOSIS — E119 Type 2 diabetes mellitus without complications: Secondary | ICD-10-CM | POA: Diagnosis not present

## 2018-04-16 DIAGNOSIS — D631 Anemia in chronic kidney disease: Secondary | ICD-10-CM | POA: Diagnosis not present

## 2018-04-16 DIAGNOSIS — I130 Hypertensive heart and chronic kidney disease with heart failure and stage 1 through stage 4 chronic kidney disease, or unspecified chronic kidney disease: Secondary | ICD-10-CM | POA: Diagnosis not present

## 2018-04-16 NOTE — Addendum Note (Signed)
Addended by: JEFFRIES, Sharlot Gowda on: 04/16/2018 09:03 AM   Modules accepted: Orders

## 2018-04-16 NOTE — Telephone Encounter (Signed)
Agree, can take extra again tomorrow if needed.  We also ordered Nocturnal oximetry this am, with his O2 sats dropping overnight per daughter.

## 2018-04-16 NOTE — Telephone Encounter (Signed)
Noted  

## 2018-04-16 NOTE — Telephone Encounter (Signed)
[facility]) called to report that pt has increased edema, and SOB every night. Lung sounds are clear, vitals are good. Pt is currently taking torsemide 20 mg (Take 1 tablet (20 mg total) by mouth daily. Can take 20 mg extra as needed). Per pt, he will take an extra torsemide today.

## 2018-04-16 NOTE — Progress Notes (Signed)
Order for home oxygen placed. Will only need O2 at night

## 2018-04-17 ENCOUNTER — Other Ambulatory Visit: Payer: Self-pay | Admitting: Hematology & Oncology

## 2018-04-17 ENCOUNTER — Telehealth (HOSPITAL_COMMUNITY): Payer: Self-pay

## 2018-04-17 DIAGNOSIS — R778 Other specified abnormalities of plasma proteins: Secondary | ICD-10-CM

## 2018-04-17 DIAGNOSIS — E1122 Type 2 diabetes mellitus with diabetic chronic kidney disease: Secondary | ICD-10-CM | POA: Diagnosis not present

## 2018-04-17 DIAGNOSIS — I130 Hypertensive heart and chronic kidney disease with heart failure and stage 1 through stage 4 chronic kidney disease, or unspecified chronic kidney disease: Secondary | ICD-10-CM | POA: Diagnosis not present

## 2018-04-17 DIAGNOSIS — Z7189 Other specified counseling: Secondary | ICD-10-CM

## 2018-04-17 DIAGNOSIS — I4891 Unspecified atrial fibrillation: Secondary | ICD-10-CM | POA: Diagnosis not present

## 2018-04-17 DIAGNOSIS — I5043 Acute on chronic combined systolic (congestive) and diastolic (congestive) heart failure: Secondary | ICD-10-CM | POA: Diagnosis not present

## 2018-04-17 DIAGNOSIS — D631 Anemia in chronic kidney disease: Secondary | ICD-10-CM | POA: Diagnosis not present

## 2018-04-17 DIAGNOSIS — C9000 Multiple myeloma not having achieved remission: Secondary | ICD-10-CM

## 2018-04-17 DIAGNOSIS — K7460 Unspecified cirrhosis of liver: Secondary | ICD-10-CM | POA: Diagnosis not present

## 2018-04-17 DIAGNOSIS — J029 Acute pharyngitis, unspecified: Secondary | ICD-10-CM

## 2018-04-17 DIAGNOSIS — N183 Chronic kidney disease, stage 3 (moderate): Secondary | ICD-10-CM | POA: Diagnosis not present

## 2018-04-17 DIAGNOSIS — I429 Cardiomyopathy, unspecified: Secondary | ICD-10-CM | POA: Diagnosis not present

## 2018-04-17 NOTE — Telephone Encounter (Signed)
PA submitted for renewal of Vyndaqel to [facility] via CMM.

## 2018-04-20 ENCOUNTER — Telehealth (HOSPITAL_COMMUNITY): Payer: Self-pay

## 2018-04-20 NOTE — Telephone Encounter (Signed)
Prior authorization through [place] was APPROVED for [employer] and will expire on 04/15/2019.

## 2018-04-21 ENCOUNTER — Telehealth (HOSPITAL_COMMUNITY): Payer: Self-pay | Admitting: Cardiology

## 2018-04-21 NOTE — Telephone Encounter (Signed)
Per patient and patients daughter reports of decreased O2 sats (80's) at night request oxygen for night time use.  Per Oda Kilts ok to place order  Per [facility] patient will need a visit with documentation of oxygen need (o2 sat qualifying/ walk test) [place] scheduled 1/8 and patient aware

## 2018-04-22 ENCOUNTER — Ambulatory Visit (HOSPITAL_COMMUNITY)
Admission: RE | Admit: 2018-04-22 | Discharge: 2018-04-22 | Disposition: A | Discharge status: Home / Self Care | Payer: Medicare HMO | Source: Ambulatory Visit | Attending: Internal Medicine | Admitting: Internal Medicine

## 2018-04-22 DIAGNOSIS — I429 Cardiomyopathy, unspecified: Secondary | ICD-10-CM | POA: Diagnosis not present

## 2018-04-22 DIAGNOSIS — I5023 Acute on chronic systolic (congestive) heart failure: Secondary | ICD-10-CM | POA: Diagnosis not present

## 2018-04-22 DIAGNOSIS — I5043 Acute on chronic combined systolic (congestive) and diastolic (congestive) heart failure: Secondary | ICD-10-CM | POA: Diagnosis not present

## 2018-04-22 DIAGNOSIS — C44329 Squamous cell carcinoma of skin of other parts of face: Secondary | ICD-10-CM | POA: Diagnosis not present

## 2018-04-22 DIAGNOSIS — L578 Other skin changes due to chronic exposure to nonionizing radiation: Secondary | ICD-10-CM | POA: Diagnosis not present

## 2018-04-22 DIAGNOSIS — E1122 Type 2 diabetes mellitus with diabetic chronic kidney disease: Secondary | ICD-10-CM | POA: Diagnosis not present

## 2018-04-22 DIAGNOSIS — C44529 Squamous cell carcinoma of skin of other part of trunk: Secondary | ICD-10-CM | POA: Diagnosis not present

## 2018-04-22 DIAGNOSIS — I130 Hypertensive heart and chronic kidney disease with heart failure and stage 1 through stage 4 chronic kidney disease, or unspecified chronic kidney disease: Secondary | ICD-10-CM | POA: Diagnosis not present

## 2018-04-22 DIAGNOSIS — K7460 Unspecified cirrhosis of liver: Secondary | ICD-10-CM | POA: Diagnosis not present

## 2018-04-22 DIAGNOSIS — L570 Actinic keratosis: Secondary | ICD-10-CM | POA: Diagnosis not present

## 2018-04-22 DIAGNOSIS — D045 Carcinoma in situ of skin of trunk: Secondary | ICD-10-CM | POA: Diagnosis not present

## 2018-04-22 DIAGNOSIS — C9000 Multiple myeloma not having achieved remission: Secondary | ICD-10-CM | POA: Diagnosis not present

## 2018-04-22 DIAGNOSIS — N183 Chronic kidney disease, stage 3 (moderate): Secondary | ICD-10-CM | POA: Diagnosis not present

## 2018-04-22 DIAGNOSIS — L821 Other seborrheic keratosis: Secondary | ICD-10-CM | POA: Diagnosis not present

## 2018-04-22 DIAGNOSIS — D631 Anemia in chronic kidney disease: Secondary | ICD-10-CM | POA: Diagnosis not present

## 2018-04-22 DIAGNOSIS — C44622 Squamous cell carcinoma of skin of right upper limb, including shoulder: Secondary | ICD-10-CM | POA: Diagnosis not present

## 2018-04-22 DIAGNOSIS — I4891 Unspecified atrial fibrillation: Secondary | ICD-10-CM | POA: Diagnosis not present

## 2018-04-22 NOTE — Progress Notes (Signed)
Pt presented to office for O2 qualifying walk test.   SATURATION QUALIFICATIONS: (This note is used to comply with regulatory documentation for home oxygen)  Patient Saturations on Room Air at Rest = 97%  Patient Saturations on Room Air while Ambulating = 96%  Patient Saturations on 2 Liters of oxygen while Ambulating = 100%  Please briefly explain why patient needs home oxygen: Pt request home O2 at night for SOB/CHF.

## 2018-04-23 DIAGNOSIS — K7460 Unspecified cirrhosis of liver: Secondary | ICD-10-CM | POA: Diagnosis not present

## 2018-04-23 DIAGNOSIS — I429 Cardiomyopathy, unspecified: Secondary | ICD-10-CM | POA: Diagnosis not present

## 2018-04-23 DIAGNOSIS — I130 Hypertensive heart and chronic kidney disease with heart failure and stage 1 through stage 4 chronic kidney disease, or unspecified chronic kidney disease: Secondary | ICD-10-CM | POA: Diagnosis not present

## 2018-04-23 DIAGNOSIS — N183 Chronic kidney disease, stage 3 (moderate): Secondary | ICD-10-CM | POA: Diagnosis not present

## 2018-04-23 DIAGNOSIS — D631 Anemia in chronic kidney disease: Secondary | ICD-10-CM | POA: Diagnosis not present

## 2018-04-23 DIAGNOSIS — E1122 Type 2 diabetes mellitus with diabetic chronic kidney disease: Secondary | ICD-10-CM | POA: Diagnosis not present

## 2018-04-23 DIAGNOSIS — I5043 Acute on chronic combined systolic (congestive) and diastolic (congestive) heart failure: Secondary | ICD-10-CM | POA: Diagnosis not present

## 2018-04-23 DIAGNOSIS — I4891 Unspecified atrial fibrillation: Secondary | ICD-10-CM | POA: Diagnosis not present

## 2018-04-23 DIAGNOSIS — E119 Type 2 diabetes mellitus without complications: Secondary | ICD-10-CM | POA: Diagnosis not present

## 2018-04-23 DIAGNOSIS — C9000 Multiple myeloma not having achieved remission: Secondary | ICD-10-CM | POA: Diagnosis not present

## 2018-04-24 DIAGNOSIS — C9000 Multiple myeloma not having achieved remission: Secondary | ICD-10-CM | POA: Diagnosis not present

## 2018-04-24 DIAGNOSIS — N183 Chronic kidney disease, stage 3 (moderate): Secondary | ICD-10-CM | POA: Diagnosis not present

## 2018-04-24 DIAGNOSIS — I5043 Acute on chronic combined systolic (congestive) and diastolic (congestive) heart failure: Secondary | ICD-10-CM | POA: Diagnosis not present

## 2018-04-24 DIAGNOSIS — D631 Anemia in chronic kidney disease: Secondary | ICD-10-CM | POA: Diagnosis not present

## 2018-04-24 DIAGNOSIS — I4891 Unspecified atrial fibrillation: Secondary | ICD-10-CM | POA: Diagnosis not present

## 2018-04-24 DIAGNOSIS — I130 Hypertensive heart and chronic kidney disease with heart failure and stage 1 through stage 4 chronic kidney disease, or unspecified chronic kidney disease: Secondary | ICD-10-CM | POA: Diagnosis not present

## 2018-04-24 DIAGNOSIS — E1122 Type 2 diabetes mellitus with diabetic chronic kidney disease: Secondary | ICD-10-CM | POA: Diagnosis not present

## 2018-04-24 DIAGNOSIS — K7460 Unspecified cirrhosis of liver: Secondary | ICD-10-CM | POA: Diagnosis not present

## 2018-04-24 DIAGNOSIS — I429 Cardiomyopathy, unspecified: Secondary | ICD-10-CM | POA: Diagnosis not present

## 2018-04-26 DIAGNOSIS — N183 Chronic kidney disease, stage 3 (moderate): Secondary | ICD-10-CM | POA: Diagnosis not present

## 2018-04-26 DIAGNOSIS — E119 Type 2 diabetes mellitus without complications: Secondary | ICD-10-CM | POA: Diagnosis not present

## 2018-04-26 DIAGNOSIS — I5043 Acute on chronic combined systolic (congestive) and diastolic (congestive) heart failure: Secondary | ICD-10-CM | POA: Diagnosis not present

## 2018-04-27 ENCOUNTER — Other Ambulatory Visit (HOSPITAL_COMMUNITY): Payer: Self-pay | Admitting: Internal Medicine

## 2018-04-28 ENCOUNTER — Ambulatory Visit (INDEPENDENT_AMBULATORY_CARE_PROVIDER_SITE_OTHER): Payer: Medicare HMO

## 2018-04-28 DIAGNOSIS — I442 Atrioventricular block, complete: Secondary | ICD-10-CM

## 2018-04-29 ENCOUNTER — Other Ambulatory Visit (HOSPITAL_COMMUNITY): Payer: Self-pay | Admitting: Internal Medicine

## 2018-04-29 DIAGNOSIS — C9000 Multiple myeloma not having achieved remission: Secondary | ICD-10-CM | POA: Diagnosis not present

## 2018-04-29 DIAGNOSIS — E1122 Type 2 diabetes mellitus with diabetic chronic kidney disease: Secondary | ICD-10-CM | POA: Diagnosis not present

## 2018-04-29 DIAGNOSIS — I4891 Unspecified atrial fibrillation: Secondary | ICD-10-CM | POA: Diagnosis not present

## 2018-04-29 DIAGNOSIS — D631 Anemia in chronic kidney disease: Secondary | ICD-10-CM | POA: Diagnosis not present

## 2018-04-29 DIAGNOSIS — K7460 Unspecified cirrhosis of liver: Secondary | ICD-10-CM | POA: Diagnosis not present

## 2018-04-29 DIAGNOSIS — I5043 Acute on chronic combined systolic (congestive) and diastolic (congestive) heart failure: Secondary | ICD-10-CM | POA: Diagnosis not present

## 2018-04-29 DIAGNOSIS — N183 Chronic kidney disease, stage 3 (moderate): Secondary | ICD-10-CM | POA: Diagnosis not present

## 2018-04-29 DIAGNOSIS — I130 Hypertensive heart and chronic kidney disease with heart failure and stage 1 through stage 4 chronic kidney disease, or unspecified chronic kidney disease: Secondary | ICD-10-CM | POA: Diagnosis not present

## 2018-04-29 DIAGNOSIS — I429 Cardiomyopathy, unspecified: Secondary | ICD-10-CM | POA: Diagnosis not present

## 2018-04-29 NOTE — Progress Notes (Signed)
Remote pacemaker transmission.   

## 2018-04-30 ENCOUNTER — Other Ambulatory Visit (HOSPITAL_COMMUNITY): Payer: Self-pay | Admitting: Internal Medicine

## 2018-04-30 DIAGNOSIS — E119 Type 2 diabetes mellitus without complications: Secondary | ICD-10-CM | POA: Diagnosis not present

## 2018-04-30 DIAGNOSIS — E1122 Type 2 diabetes mellitus with diabetic chronic kidney disease: Secondary | ICD-10-CM | POA: Diagnosis not present

## 2018-04-30 DIAGNOSIS — D631 Anemia in chronic kidney disease: Secondary | ICD-10-CM | POA: Diagnosis not present

## 2018-04-30 DIAGNOSIS — I429 Cardiomyopathy, unspecified: Secondary | ICD-10-CM | POA: Diagnosis not present

## 2018-04-30 DIAGNOSIS — K7460 Unspecified cirrhosis of liver: Secondary | ICD-10-CM | POA: Diagnosis not present

## 2018-04-30 DIAGNOSIS — I130 Hypertensive heart and chronic kidney disease with heart failure and stage 1 through stage 4 chronic kidney disease, or unspecified chronic kidney disease: Secondary | ICD-10-CM | POA: Diagnosis not present

## 2018-04-30 DIAGNOSIS — I4891 Unspecified atrial fibrillation: Secondary | ICD-10-CM | POA: Diagnosis not present

## 2018-04-30 DIAGNOSIS — C9000 Multiple myeloma not having achieved remission: Secondary | ICD-10-CM | POA: Diagnosis not present

## 2018-04-30 DIAGNOSIS — N183 Chronic kidney disease, stage 3 (moderate): Secondary | ICD-10-CM | POA: Diagnosis not present

## 2018-04-30 DIAGNOSIS — I5043 Acute on chronic combined systolic (congestive) and diastolic (congestive) heart failure: Secondary | ICD-10-CM | POA: Diagnosis not present

## 2018-05-02 LAB — CUP PACEART REMOTE DEVICE CHECK
Battery Remaining Longevity: 74 mo
Battery Voltage: 3.05 V
Brady Statistic AP VP Percent: 18.01 %
Brady Statistic AP VS Percent: 0.05 %
Brady Statistic AS VP Percent: 79.72 %
Brady Statistic AS VS Percent: 2.13 %
Brady Statistic RA Percent Paced: 10.68 %
Brady Statistic RV Percent Paced: 86.31 %
Date Time Interrogation Session: 20200114083815
Implantable Lead Implant Date: 20190926
Implantable Lead Implant Date: 20190926
Implantable Lead Location: 753859
Implantable Lead Location: 753860
Implantable Lead Model: 3830
Implantable Pulse Generator Implant Date: 20190926
Lead Channel Impedance Value: 266 Ohm
Lead Channel Impedance Value: 285 Ohm
Lead Channel Impedance Value: 361 Ohm
Lead Channel Impedance Value: 418 Ohm
Lead Channel Pacing Threshold Amplitude: 0.75 V
Lead Channel Pacing Threshold Pulse Width: 0.4 ms
Lead Channel Sensing Intrinsic Amplitude: 1.625 mV
Lead Channel Sensing Intrinsic Amplitude: 1.625 mV
Lead Channel Sensing Intrinsic Amplitude: 1.875 mV
Lead Channel Sensing Intrinsic Amplitude: 1.875 mV
Lead Channel Setting Pacing Amplitude: 3.5 V
Lead Channel Setting Pacing Amplitude: 3.5 V
Lead Channel Setting Pacing Pulse Width: 1 ms
Lead Channel Setting Sensing Sensitivity: 0.9 mV

## 2018-05-04 DIAGNOSIS — I5043 Acute on chronic combined systolic (congestive) and diastolic (congestive) heart failure: Secondary | ICD-10-CM | POA: Diagnosis not present

## 2018-05-04 DIAGNOSIS — N183 Chronic kidney disease, stage 3 (moderate): Secondary | ICD-10-CM | POA: Diagnosis not present

## 2018-05-04 DIAGNOSIS — I4891 Unspecified atrial fibrillation: Secondary | ICD-10-CM | POA: Diagnosis not present

## 2018-05-04 DIAGNOSIS — I130 Hypertensive heart and chronic kidney disease with heart failure and stage 1 through stage 4 chronic kidney disease, or unspecified chronic kidney disease: Secondary | ICD-10-CM | POA: Diagnosis not present

## 2018-05-04 DIAGNOSIS — C9000 Multiple myeloma not having achieved remission: Secondary | ICD-10-CM | POA: Diagnosis not present

## 2018-05-04 DIAGNOSIS — K7460 Unspecified cirrhosis of liver: Secondary | ICD-10-CM | POA: Diagnosis not present

## 2018-05-04 DIAGNOSIS — E1122 Type 2 diabetes mellitus with diabetic chronic kidney disease: Secondary | ICD-10-CM | POA: Diagnosis not present

## 2018-05-04 DIAGNOSIS — I429 Cardiomyopathy, unspecified: Secondary | ICD-10-CM | POA: Diagnosis not present

## 2018-05-04 DIAGNOSIS — D631 Anemia in chronic kidney disease: Secondary | ICD-10-CM | POA: Diagnosis not present

## 2018-05-05 ENCOUNTER — Telehealth: Payer: Self-pay

## 2018-05-05 NOTE — Telephone Encounter (Signed)
-----   Message from Will Meredith Leeds, MD sent at 05/03/2018  1:33 PM EST ----- Abnormal device interrogation reviewed.  Lead parameters and battery status stable.  Needs appointement to discuss flutter and for device follow up.

## 2018-05-05 NOTE — Telephone Encounter (Signed)
Pt agreeable to apt on 05/20/2018 at 3:00pm.

## 2018-05-06 ENCOUNTER — Other Ambulatory Visit: Payer: Self-pay | Admitting: Family Medicine

## 2018-05-06 DIAGNOSIS — G2581 Restless legs syndrome: Secondary | ICD-10-CM

## 2018-05-06 DIAGNOSIS — R110 Nausea: Secondary | ICD-10-CM

## 2018-05-07 DIAGNOSIS — D631 Anemia in chronic kidney disease: Secondary | ICD-10-CM | POA: Diagnosis not present

## 2018-05-07 DIAGNOSIS — I130 Hypertensive heart and chronic kidney disease with heart failure and stage 1 through stage 4 chronic kidney disease, or unspecified chronic kidney disease: Secondary | ICD-10-CM | POA: Diagnosis not present

## 2018-05-07 DIAGNOSIS — I429 Cardiomyopathy, unspecified: Secondary | ICD-10-CM | POA: Diagnosis not present

## 2018-05-07 DIAGNOSIS — I5043 Acute on chronic combined systolic (congestive) and diastolic (congestive) heart failure: Secondary | ICD-10-CM | POA: Diagnosis not present

## 2018-05-07 DIAGNOSIS — C9000 Multiple myeloma not having achieved remission: Secondary | ICD-10-CM | POA: Diagnosis not present

## 2018-05-07 DIAGNOSIS — K7460 Unspecified cirrhosis of liver: Secondary | ICD-10-CM | POA: Diagnosis not present

## 2018-05-07 DIAGNOSIS — I4891 Unspecified atrial fibrillation: Secondary | ICD-10-CM | POA: Diagnosis not present

## 2018-05-07 DIAGNOSIS — N183 Chronic kidney disease, stage 3 (moderate): Secondary | ICD-10-CM | POA: Diagnosis not present

## 2018-05-07 DIAGNOSIS — E1122 Type 2 diabetes mellitus with diabetic chronic kidney disease: Secondary | ICD-10-CM | POA: Diagnosis not present

## 2018-05-07 DIAGNOSIS — E119 Type 2 diabetes mellitus without complications: Secondary | ICD-10-CM | POA: Diagnosis not present

## 2018-05-12 ENCOUNTER — Telehealth: Payer: Self-pay | Admitting: Hematology & Oncology

## 2018-05-12 DIAGNOSIS — C44519 Basal cell carcinoma of skin of other part of trunk: Secondary | ICD-10-CM | POA: Diagnosis not present

## 2018-05-12 NOTE — Telephone Encounter (Signed)
Called and spoke with patient regarding r/s his 2/4 appt from Dr Marin Olp to [facility] due to Dr Judithann Sheen out of office @ 10:00 per Hilario Quarry, RN. OK per patient

## 2018-05-13 ENCOUNTER — Ambulatory Visit (HOSPITAL_COMMUNITY)
Admission: RE | Admit: 2018-05-13 | Discharge: 2018-05-13 | Disposition: A | Discharge status: Home / Self Care | Payer: Medicare HMO | Source: Ambulatory Visit | Attending: Internal Medicine | Admitting: Internal Medicine

## 2018-05-13 ENCOUNTER — Encounter (HOSPITAL_COMMUNITY): Payer: Self-pay

## 2018-05-13 ENCOUNTER — Encounter (HOSPITAL_COMMUNITY): Payer: Self-pay | Admitting: Internal Medicine

## 2018-05-13 ENCOUNTER — Other Ambulatory Visit: Payer: Self-pay

## 2018-05-13 ENCOUNTER — Other Ambulatory Visit (HOSPITAL_COMMUNITY): Payer: Self-pay

## 2018-05-13 VITALS — BP 110/78 | HR 61 | Wt 190.6 lb

## 2018-05-13 DIAGNOSIS — I5042 Chronic combined systolic (congestive) and diastolic (congestive) heart failure: Secondary | ICD-10-CM | POA: Diagnosis not present

## 2018-05-13 DIAGNOSIS — K449 Diaphragmatic hernia without obstruction or gangrene: Secondary | ICD-10-CM | POA: Diagnosis not present

## 2018-05-13 DIAGNOSIS — Z66 Do not resuscitate: Secondary | ICD-10-CM | POA: Insufficient documentation

## 2018-05-13 DIAGNOSIS — R110 Nausea: Secondary | ICD-10-CM | POA: Insufficient documentation

## 2018-05-13 DIAGNOSIS — I4510 Unspecified right bundle-branch block: Secondary | ICD-10-CM | POA: Diagnosis not present

## 2018-05-13 DIAGNOSIS — E859 Amyloidosis, unspecified: Secondary | ICD-10-CM | POA: Diagnosis not present

## 2018-05-13 DIAGNOSIS — I472 Ventricular tachycardia: Secondary | ICD-10-CM | POA: Diagnosis not present

## 2018-05-13 DIAGNOSIS — E1122 Type 2 diabetes mellitus with diabetic chronic kidney disease: Secondary | ICD-10-CM | POA: Diagnosis not present

## 2018-05-13 DIAGNOSIS — K2270 Barrett's esophagus without dysplasia: Secondary | ICD-10-CM | POA: Diagnosis not present

## 2018-05-13 DIAGNOSIS — C9000 Multiple myeloma not having achieved remission: Secondary | ICD-10-CM | POA: Diagnosis not present

## 2018-05-13 DIAGNOSIS — E785 Hyperlipidemia, unspecified: Secondary | ICD-10-CM | POA: Diagnosis not present

## 2018-05-13 DIAGNOSIS — K760 Fatty (change of) liver, not elsewhere classified: Secondary | ICD-10-CM | POA: Insufficient documentation

## 2018-05-13 DIAGNOSIS — N183 Chronic kidney disease, stage 3 unspecified: Secondary | ICD-10-CM

## 2018-05-13 DIAGNOSIS — I428 Other cardiomyopathies: Secondary | ICD-10-CM | POA: Diagnosis not present

## 2018-05-13 DIAGNOSIS — R001 Bradycardia, unspecified: Secondary | ICD-10-CM | POA: Insufficient documentation

## 2018-05-13 DIAGNOSIS — Z87891 Personal history of nicotine dependence: Secondary | ICD-10-CM | POA: Insufficient documentation

## 2018-05-13 DIAGNOSIS — I071 Rheumatic tricuspid insufficiency: Secondary | ICD-10-CM | POA: Diagnosis not present

## 2018-05-13 DIAGNOSIS — Z8249 Family history of ischemic heart disease and other diseases of the circulatory system: Secondary | ICD-10-CM | POA: Insufficient documentation

## 2018-05-13 DIAGNOSIS — K219 Gastro-esophageal reflux disease without esophagitis: Secondary | ICD-10-CM | POA: Insufficient documentation

## 2018-05-13 DIAGNOSIS — I4891 Unspecified atrial fibrillation: Secondary | ICD-10-CM | POA: Diagnosis not present

## 2018-05-13 DIAGNOSIS — E7800 Pure hypercholesterolemia, unspecified: Secondary | ICD-10-CM | POA: Diagnosis not present

## 2018-05-13 DIAGNOSIS — I4892 Unspecified atrial flutter: Secondary | ICD-10-CM | POA: Insufficient documentation

## 2018-05-13 DIAGNOSIS — I5023 Acute on chronic systolic (congestive) heart failure: Secondary | ICD-10-CM

## 2018-05-13 DIAGNOSIS — Z79899 Other long term (current) drug therapy: Secondary | ICD-10-CM | POA: Insufficient documentation

## 2018-05-13 DIAGNOSIS — I340 Nonrheumatic mitral (valve) insufficiency: Secondary | ICD-10-CM | POA: Insufficient documentation

## 2018-05-13 DIAGNOSIS — M1990 Unspecified osteoarthritis, unspecified site: Secondary | ICD-10-CM | POA: Diagnosis not present

## 2018-05-13 DIAGNOSIS — R55 Syncope and collapse: Secondary | ICD-10-CM | POA: Diagnosis not present

## 2018-05-13 DIAGNOSIS — K625 Hemorrhage of anus and rectum: Secondary | ICD-10-CM | POA: Diagnosis not present

## 2018-05-13 DIAGNOSIS — I130 Hypertensive heart and chronic kidney disease with heart failure and stage 1 through stage 4 chronic kidney disease, or unspecified chronic kidney disease: Secondary | ICD-10-CM | POA: Insufficient documentation

## 2018-05-13 DIAGNOSIS — Z7901 Long term (current) use of anticoagulants: Secondary | ICD-10-CM | POA: Insufficient documentation

## 2018-05-13 LAB — CBC
HCT: 39.1 % (ref 39.0–52.0)
Hemoglobin: 12.6 g/dL — ABNORMAL LOW (ref 13.0–17.0)
MCH: 32.1 pg (ref 26.0–34.0)
MCHC: 32.2 g/dL (ref 30.0–36.0)
MCV: 99.7 fL (ref 80.0–100.0)
Platelets: UNDETERMINED 10*3/uL (ref 150–400)
RBC: 3.92 MIL/uL — AB (ref 4.22–5.81)
RDW: 19.3 % — ABNORMAL HIGH (ref 11.5–15.5)
WBC: 6.2 10*3/uL (ref 4.0–10.5)
nRBC: 0 % (ref 0.0–0.2)

## 2018-05-13 LAB — BASIC METABOLIC PANEL
Anion gap: 11 (ref 5–15)
BUN: 17 mg/dL (ref 8–23)
CO2: 29 mmol/L (ref 22–32)
Calcium: 9 mg/dL (ref 8.9–10.3)
Chloride: 98 mmol/L (ref 98–111)
Creatinine, Ser: 1.77 mg/dL — ABNORMAL HIGH (ref 0.61–1.24)
GFR calc Af Amer: 42 mL/min — ABNORMAL LOW (ref >60)
GFR calc non Af Amer: 36 mL/min — ABNORMAL LOW (ref >60)
Glucose, Bld: 146 mg/dL — ABNORMAL HIGH (ref 70–99)
POTASSIUM: 3.7 mmol/L (ref 3.5–5.1)
Sodium: 138 mmol/L (ref 135–145)

## 2018-05-13 NOTE — Progress Notes (Signed)
Advanced Heart Failure Clinic Note   Referring Physician: PCP: [name], MD PCP-Cardiologist: Glenetta Hew, MD  Primary HF: Dr. [name]   HPI: DEX BLAKELY is a 78 y.o. malewith a hx of chronic combined CHF due to NICM, CKD III,presumedNASH(per pt, never proven),esophageal varices per chart(prior glass of wine occasionally but no heavy alcohol),HTN, HLD, DM, Barrett's esophagus, GERD, anemia, RBBB  He was admitted 3/13-3/17/19 for A/C combined HF. While inpatient, SPEP and UPEP sent for concerns for cardiac amyloidosis. He diuresed with IV lasix and was transitioned to lasix 80 mg q am, 40 mg q pm. DC weight 180 lbs.   NICM by cath at [facility] 09/2016 with EF 40-45%. Echo 06/2017: EF 25-30%,NICM by cath at [facility] 09/2016 with EF 40-45%. Echo 06/2017: EF 25-30%,  His SPEP came back with m-spike 0.8% and he was referred to Dr Beryle Beams. UPEP with Bence Jones Protein positive; kappa type.   PYP scan completed on 3/20 strongly suggestive of transthyretin amyloidosis. Genetic testing negative.  Seen in clinic on 3/26 and transitioned from lasix to torsemide. Admitted 3/28-3/31 with URI and A/C HF. URI treated with antibiotics. Diuresed with IV lasix, then transitioned to torsemide 40 mg BID. Genetic testing sent.   Admitted 9/21-1/94/17 with A/C systolic HF. He was diuresed with IV lasix. He required milrinone for low output. He was in afib RVR and was cardioverted twice. He was treated for sepsis. He had AKI on CKD that resolved prior to discharge. He also had rectal bleeding and required 1 unit RBCs. Eliquis was held and then dose was reduced. Palliative met with him and he was made DNR/I. Discharged home with milrinone.   Admitted 8/20-8/22/19 with fever and presumed HCAP. PCT was negative. He did not need abx at discharge. HF team consulted. He was stable from HF perspective. Continued milrinone, torsemide, and tafamidis. He was back in rate controlled atrial flutter, but  was asymptomatic. DC weight 174 lbs.   Admitted 01/06/18 after he had syncopal event.  CT of head was negative for bleed. EKG showed complete heart block. Amiodarone was stopped. EP consulted and on 9/26 he underwent Medtronic PPM. Eliquis was restarted on 01/09/2018.  Discharge weight 175 pounds.    Echo 12/19 EF 35-40% Personally reviewed  He presents today for regular follow up. Remains on milrinone Over past week has been having worsening DOE. Now SOB with minimal exertion. Over the past 24 hours feels much worse. + nausea and no appetite. Ankles are swollen. + orthopnea. Taking torsemide 20 daily but has doubled it to 6m with effect.   Review of systems complete and found to be negative unless listed in HPI.   Past Medical History:  Diagnosis Date  . Anemia 11/2016  . Arthritis    "some; hands/fingers" (06/25/2017)  . Atrial tachycardia ([place]   . Barrett's esophagus   . Basal cell carcinoma    "face, arms, hands" (06/25/2017)  . Chicken pox   . Cholelithiasis 11/2016   S/P LAP CHOLE  . Chronic combined systolic and diastolic CHF (congestive heart failure) ([place]   . CKD (chronic kidney disease), stage III ([place]   . Esophageal varices (HCC)    Secondary to NAFLD  . Family history of adverse reaction to anesthesia    "daughter's sat drop very low; last OR had to be reintubated" (06/25/2017)  . GERD (gastroesophageal reflux disease)   . Goals of care, counseling/discussion 10/22/2017  . High cholesterol   . Hypertension   . Kappa light chain  myeloma ([place]) 10/22/2017  . Mitral regurgitation   . NAFLD (nonalcoholic fatty liver disease)   . NICM (nonischemic cardiomyopathy) ([place])    Echo 06/2017: Severe LVH.  EF 25 to 30% with mild HK of the baseline after septal wall.  Moderate to severely dilated right atrium, moderately dilated RV. (Concern fr ? amyloidosis).  . NSVT (nonsustained ventricular tachycardia) ([place])   . Pneumonia 09/2016  . RBBB   . Tricuspid regurgitation   . Type II  diabetes mellitus ([place])     Current Outpatient Medications  Medication Sig Dispense Refill  . apixaban (ELIQUIS) 2.5 MG TABS tablet Take 1 tablet (2.5 mg total) by mouth 2 (two) times daily. 60 tablet 11  . dronabinol (MARINOL) 5 MG capsule TAKE 1 CAPSULE(5 MG) BY MOUTH TWICE DAILY BEFORE LUNCH AND SUPPER 60 capsule 2  . gabapentin (NEURONTIN) 800 MG tablet TAKE 1 TABLET BY MOUTH AT BEDTIME (Patient taking differently: 400 mg at bedtime. ) 90 tablet 1  . Melatonin 5 MG TABS Take 5 mg by mouth at bedtime.     . milrinone (PRIMACOR) 20 MG/100 ML SOLN infusion Inject 0.0099 mg/min into the vein continuous. 100 mL 0  . omeprazole (PRILOSEC) 40 MG capsule TAKE 1 CAPSULE(40 MG) BY MOUTH DAILY 90 capsule 1  . ondansetron (ZOFRAN) 4 MG tablet TAKE 1 TABLET(4 MG) BY MOUTH EVERY 8 HOURS AS NEEDED FOR NAUSEA OR VOMITING 30 tablet 2  . polyethylene glycol (MIRALAX / GLYCOLAX) packet Take 17 g by mouth daily. (Patient taking differently: Take 17 g by mouth daily as needed. ) 14 each 0  . potassium chloride SA (K-DUR,KLOR-CON) 20 MEQ tablet Take 20 mEq by mouth daily.     . rosuvastatin (CRESTOR) 10 MG tablet TAKE 1 TABLET BY MOUTH ONCE DAILY 90 tablet 1  . torsemide (DEMADEX) 20 MG tablet Take 1 tablet (20 mg total) by mouth daily. Can take 20 mg extra as needed. 40 tablet 3  . traZODone (DESYREL) 50 MG tablet TAKE 1 TABLET(50 MG) BY MOUTH AT BEDTIME 90 tablet 1  . vitamin B-12 (CYANOCOBALAMIN) 500 MCG tablet Take 500 mcg by mouth every other day.    Marland Kitchen VYNDAQEL 20 MG CAPS Take 80 mg by mouth daily. (4 capsules) (Patient taking differently: Take 80 mg by mouth every evening. ) 120 capsule   . zolpidem (AMBIEN) 5 MG tablet TAKE 1 TABLET(5 MG) BY MOUTH AT BEDTIME 30 tablet 0  . glucose blood test strip Use as instructed 100 each 12  . hydrocortisone (ANUSOL-HC) 25 MG suppository Place 1 suppository (25 mg total) rectally 2 (two) times daily. (Patient not taking: Reported on 03/16/2018) 12 suppository 0  .  senna-docusate (SENOKOT-S) 8.6-50 MG tablet Take 2 tablets by mouth 2 (two) times daily. (Patient taking differently: Take 1 tablet by mouth See admin instructions. Take one tablet by mouth every morning, may take another tablet in the evening as needed for constipation) 20 tablet 0  . sertraline (ZOLOFT) 25 MG tablet TAKE 1 TABLET BY MOUTH EVERY DAY (Patient not taking: Reported on 05/13/2018) 90 tablet 0   No current facility-administered medications for this encounter.     No Known Allergies    Social History   Socioeconomic History  . Marital status: Married    Spouse name: Not on file  . Number of children: 4  . Years of education: 58  . Highest education level: Not on file  Occupational History  . Occupation: retired  Scientific laboratory technician  . Emergency planning/management officer  strain: Not on file  . Food insecurity:    Worry: Not on file    Inability: Not on file  . Transportation needs:    Medical: Not on file    Non-medical: Not on file  Tobacco Use  . Smoking status: Former Smoker    Packs/day: 0.30    Years: 15.00    Pack years: 4.50    Types: Cigarettes    Last attempt to quit: 03/17/1977    Years since quitting: 41.1  . Smokeless tobacco: Never Used  Substance and Sexual Activity  . Alcohol use: Yes    Comment: 06/25/2017 "nothing since 09/2016;  used to have glass of wine q hs"  . Drug use: No  . Sexual activity: Not on file  Lifestyle  . Physical activity:    Days per week: Not on file    Minutes per session: Not on file  . Stress: Not on file  Relationships  . Social connections:    Talks on phone: Not on file    Gets together: Not on file    Attends religious service: Not on file    Active member of club or organization: Not on file    Attends meetings of clubs or organizations: Not on file    Relationship status: Not on file  . Intimate partner violence:    Fear of current or ex partner: Not on file    Emotionally abused: Not on file    Physically abused: Not on file     Forced sexual activity: Not on file  Other Topics Concern  . Not on file  Social History Narrative   He is retired from [employer].  Previously worked for [employer] by his daughter --> also a patient of Dr. Ellyn Hack   Lives with wife in a one story home.    Education: high school.       Family History  Problem Relation Age of Onset  . Heart failure Mother   . Cancer Father   . Cataracts Father   . Breast cancer Sister   . Alcohol abuse Brother   . Liver disease Brother   . Autoimmune disease Daughter        Bechet's syndrome  . Cirrhosis Neg Hx    Vitals:   05/13/18 1434  Weight: 86.5 kg (190 lb 9.6 oz)    Wt Readings from Last 3 Encounters:  05/13/18 86.5 kg (190 lb 9.6 oz)  03/16/18 83.8 kg (184 lb 12.8 oz)  02/03/18 81.8 kg (180 lb 4 oz)    PHYSICAL EXAM: General:  Elderly No resp difficulty HEENT: normal Neck: supple.JVP to jaw  R tunneled PICC site okCarotids 2+ bilat; no bruits. No lymphadenopathy or thryomegaly appreciated. Cor: PMI nondisplaced. Regular rate & rhythm. No rubs, gallops or murmurs.  Lungs: clear Abdomen: soft, nontender, + distended. No hepatosplenomegaly. No bruits or masses. Good bowel sounds. Extremities: no cyanosis, clubbing, rash, 2+ edema Neuro: alert & orientedx3, cranial nerves grossly intact. moves all 4 extremities w/o difficulty. Affect pleasant    ECG: Atrial flutter (atypical) with V-pacing at 64bpm   REDS 37%    ASSESSMENT & PLAN:   1. 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  - Echo 06/2017: EF 25-30%, mild LAE, mild/mod RV dilation, mod/severe RA. Echo 10/2017: EF 10% - Echo 12/19 EF 35-40%  -PYP scan strongly suggestive of TTR amyloid, SPEP with 0.8% m-spike, UPEP with  Bence Jones Protein positive, kappa type. 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 - Dr Marin Olp saw and thinks he does not have AL amyloid (just multiple myeloma) and  that he has TTR amyloid and should be treated with tafamidis before chemo. Tafamidis started on 8/2.  - Had been doing better until last week. Now with NYHA III-IIIB symptoms. ECG reviewed and PM interrogated and he has recurrent AFL since mid December and I think this is why he feels worse.  - Volume status elevated on exam.  - Continue torsemide 20 mg daily  - can increase to 40 daily for a day or two.  - Main issue is need to restore NSR. Plan DC-CV tomorrow. He has failed amio. AA options verylimited with low EF and CKD. Will d/w Dr. Curt Bears about possible ablation.  - Continue milrinone 0.125 mcg. Managed by [facility] If we can maintain NSR may be able to wean milrinone soon and perhaps that will also help reduce recurrence of AF.  - Continue tafamidis 80 mg daily - No ARB/entresto/spiro with for now with CKD - Have been hydralazine/imdur  2. CKD III, baseline ~1.6 - Creatinine 1.77 today  3. Atrial Flutter s/p DCCV 7/31, 8/2, 12/19/2017 -> junctional rhythm with symptomatic bradycardia -> MDT PPM - Failed amiodarone with recurrent nausea.   - Continue Eliquis 2.5 mg BID. Denies bleeding.  - Back in AFL today. See discussion above. Will discuss possible ablation with Dr. Curt Bears. (He has appt with him next week).   4. HL - Continue crestor.  5. [place] with Dr. Marin Olp  -Started on Bortezomib7/18/18andRevlimid7/23/19. - Dr Marin Olp thinks he does not have AL amyloid and that he does have TTR amyloid and should be treated with tafamidis before chemo. Now on Tafamidis.  - Continue follow up.   6. NASH with esophageal varices.  - Last [facility]3/8/2016[facility] surveillance of Esophageal varices, Barrett's esophagus, and PMHx of Colon polyps. Showed two columns of small esophageal varices w/out stigmata, short segment Barrett's esophagus (without biopsies, due to presence of EVs), 5cm hiatal hernia, and hyperplastic small gastric nodules.  - Continue  Eliquis 2.5 mg BID for afib. Denies bleeding.   7. Hx of Rectal bleeding - EGD/Colon deferred with recent (2017) showing diverticulosis and hemorrhoids. - Denies bleeding.    8. Syncope,CHB - S/P Medtronic PPM.  - Stable.   Total time spent 35 minutes. Over half that time spent discussing above.    Glori Bickers, MD 05/13/18

## 2018-05-13 NOTE — H&P (View-Only) (Signed)
Advanced Heart Failure Clinic Note   Referring Physician: PCP: [name], MD PCP-Cardiologist: Glenetta Hew, MD  Primary HF: Dr. [name]   HPI: Dillon Andrews is a 78 y.o. malewith a hx of chronic combined CHF due to NICM, CKD III,presumedNASH(per pt, never proven),esophageal varices per chart(prior glass of wine occasionally but no heavy alcohol),HTN, HLD, DM, Barrett's esophagus, GERD, anemia, RBBB  He was admitted 3/13-3/17/19 for A/C combined HF. While inpatient, SPEP and UPEP sent for concerns for cardiac amyloidosis. He diuresed with IV lasix and was transitioned to lasix 80 mg q am, 40 mg q pm. DC weight 180 lbs.   NICM by cath at [facility] 09/2016 with EF 40-45%. Echo 06/2017: EF 25-30%,NICM by cath at [facility] 09/2016 with EF 40-45%. Echo 06/2017: EF 25-30%,  His SPEP came back with m-spike 0.8% and he was referred to Dr Beryle Beams. UPEP with Bence Jones Protein positive; kappa type.   PYP scan completed on 3/20 strongly suggestive of transthyretin amyloidosis. Genetic testing negative.  Seen in clinic on 3/26 and transitioned from lasix to torsemide. Admitted 3/28-3/31 with URI and A/C HF. URI treated with antibiotics. Diuresed with IV lasix, then transitioned to torsemide 40 mg BID. Genetic testing sent.   Admitted 7/03-5/00/93 with A/C systolic HF. He was diuresed with IV lasix. He required milrinone for low output. He was in afib RVR and was cardioverted twice. He was treated for sepsis. He had AKI on CKD that resolved prior to discharge. He also had rectal bleeding and required 1 unit RBCs. Eliquis was held and then dose was reduced. Palliative met with him and he was made DNR/I. Discharged home with milrinone.   Admitted 8/20-8/22/19 with fever and presumed HCAP. PCT was negative. He did not need abx at discharge. HF team consulted. He was stable from HF perspective. Continued milrinone, torsemide, and tafamidis. He was back in rate controlled atrial flutter, but  was asymptomatic. DC weight 174 lbs.   Admitted 01/06/18 after he had syncopal event.  CT of head was negative for bleed. EKG showed complete heart block. Amiodarone was stopped. EP consulted and on 9/26 he underwent Medtronic PPM. Eliquis was restarted on 01/09/2018.  Discharge weight 175 pounds.    Echo 12/19 EF 35-40% Personally reviewed  He presents today for regular follow up. Remains on milrinone Over past week has been having worsening DOE. Now SOB with minimal exertion. Over the past 24 hours feels much worse. + nausea and no appetite. Ankles are swollen. + orthopnea. Taking torsemide 20 daily but has doubled it to 69m with effect.   Review of systems complete and found to be negative unless listed in HPI.   Past Medical History:  Diagnosis Date  . Anemia 11/2016  . Arthritis    "some; hands/fingers" (06/25/2017)  . Atrial tachycardia ([facility]   . Barrett's esophagus   . Basal cell carcinoma    "face, arms, hands" (06/25/2017)  . Chicken pox   . Cholelithiasis 11/2016   S/P LAP CHOLE  . Chronic combined systolic and diastolic CHF (congestive heart failure) ([facility]   . CKD (chronic kidney disease), stage III ([facility]   . Esophageal varices (HCC)    Secondary to NAFLD  . Family history of adverse reaction to anesthesia    "daughter's sat drop very low; last OR had to be reintubated" (06/25/2017)  . GERD (gastroesophageal reflux disease)   . Goals of care, counseling/discussion 10/22/2017  . High cholesterol   . Hypertension   . Kappa light chain  myeloma ([place]) 10/22/2017  . Mitral regurgitation   . NAFLD (nonalcoholic fatty liver disease)   . NICM (nonischemic cardiomyopathy) ([place])    Echo 06/2017: Severe LVH.  EF 25 to 30% with mild HK of the baseline after septal wall.  Moderate to severely dilated right atrium, moderately dilated RV. (Concern fr ? amyloidosis).  . NSVT (nonsustained ventricular tachycardia) ([place])   . Pneumonia 09/2016  . RBBB   . Tricuspid regurgitation   . Type II  diabetes mellitus ([place])     Current Outpatient Medications  Medication Sig Dispense Refill  . apixaban (ELIQUIS) 2.5 MG TABS tablet Take 1 tablet (2.5 mg total) by mouth 2 (two) times daily. 60 tablet 11  . dronabinol (MARINOL) 5 MG capsule TAKE 1 CAPSULE(5 MG) BY MOUTH TWICE DAILY BEFORE LUNCH AND SUPPER 60 capsule 2  . gabapentin (NEURONTIN) 800 MG tablet TAKE 1 TABLET BY MOUTH AT BEDTIME (Patient taking differently: 400 mg at bedtime. ) 90 tablet 1  . Melatonin 5 MG TABS Take 5 mg by mouth at bedtime.     . milrinone (PRIMACOR) 20 MG/100 ML SOLN infusion Inject 0.0099 mg/min into the vein continuous. 100 mL 0  . omeprazole (PRILOSEC) 40 MG capsule TAKE 1 CAPSULE(40 MG) BY MOUTH DAILY 90 capsule 1  . ondansetron (ZOFRAN) 4 MG tablet TAKE 1 TABLET(4 MG) BY MOUTH EVERY 8 HOURS AS NEEDED FOR NAUSEA OR VOMITING 30 tablet 2  . polyethylene glycol (MIRALAX / GLYCOLAX) packet Take 17 g by mouth daily. (Patient taking differently: Take 17 g by mouth daily as needed. ) 14 each 0  . potassium chloride SA (K-DUR,KLOR-CON) 20 MEQ tablet Take 20 mEq by mouth daily.     . rosuvastatin (CRESTOR) 10 MG tablet TAKE 1 TABLET BY MOUTH ONCE DAILY 90 tablet 1  . torsemide (DEMADEX) 20 MG tablet Take 1 tablet (20 mg total) by mouth daily. Can take 20 mg extra as needed. 40 tablet 3  . traZODone (DESYREL) 50 MG tablet TAKE 1 TABLET(50 MG) BY MOUTH AT BEDTIME 90 tablet 1  . vitamin B-12 (CYANOCOBALAMIN) 500 MCG tablet Take 500 mcg by mouth every other day.    Marland Kitchen VYNDAQEL 20 MG CAPS Take 80 mg by mouth daily. (4 capsules) (Patient taking differently: Take 80 mg by mouth every evening. ) 120 capsule   . zolpidem (AMBIEN) 5 MG tablet TAKE 1 TABLET(5 MG) BY MOUTH AT BEDTIME 30 tablet 0  . glucose blood test strip Use as instructed 100 each 12  . hydrocortisone (ANUSOL-HC) 25 MG suppository Place 1 suppository (25 mg total) rectally 2 (two) times daily. (Patient not taking: Reported on 03/16/2018) 12 suppository 0  .  senna-docusate (SENOKOT-S) 8.6-50 MG tablet Take 2 tablets by mouth 2 (two) times daily. (Patient taking differently: Take 1 tablet by mouth See admin instructions. Take one tablet by mouth every morning, may take another tablet in the evening as needed for constipation) 20 tablet 0  . sertraline (ZOLOFT) 25 MG tablet TAKE 1 TABLET BY MOUTH EVERY DAY (Patient not taking: Reported on 05/13/2018) 90 tablet 0   No current facility-administered medications for this encounter.     No Known Allergies    Social History   Socioeconomic History  . Marital status: Married    Spouse name: Not on file  . Number of children: 4  . Years of education: 38  . Highest education level: Not on file  Occupational History  . Occupation: retired  Scientific laboratory technician  . Emergency planning/management officer  strain: Not on file  . Food insecurity:    Worry: Not on file    Inability: Not on file  . Transportation needs:    Medical: Not on file    Non-medical: Not on file  Tobacco Use  . Smoking status: Former Smoker    Packs/day: 0.30    Years: 15.00    Pack years: 4.50    Types: Cigarettes    Last attempt to quit: 03/17/1977    Years since quitting: 41.1  . Smokeless tobacco: Never Used  Substance and Sexual Activity  . Alcohol use: Yes    Comment: 06/25/2017 "nothing since 09/2016;  used to have glass of wine q hs"  . Drug use: No  . Sexual activity: Not on file  Lifestyle  . Physical activity:    Days per week: Not on file    Minutes per session: Not on file  . Stress: Not on file  Relationships  . Social connections:    Talks on phone: Not on file    Gets together: Not on file    Attends religious service: Not on file    Active member of club or organization: Not on file    Attends meetings of clubs or organizations: Not on file    Relationship status: Not on file  . Intimate partner violence:    Fear of current or ex partner: Not on file    Emotionally abused: Not on file    Physically abused: Not on file     Forced sexual activity: Not on file  Other Topics Concern  . Not on file  Social History Narrative   He is retired from [employer].  Previously worked for [employer] by his daughter --> also a patient of Dr. Ellyn Hack   Lives with wife in a one story home.    Education: high school.       Family History  Problem Relation Age of Onset  . Heart failure Mother   . Cancer Father   . Cataracts Father   . Breast cancer Sister   . Alcohol abuse Brother   . Liver disease Brother   . Autoimmune disease Daughter        Bechet's syndrome  . Cirrhosis Neg Hx    Vitals:   05/13/18 1434  Weight: 86.5 kg (190 lb 9.6 oz)    Wt Readings from Last 3 Encounters:  05/13/18 86.5 kg (190 lb 9.6 oz)  03/16/18 83.8 kg (184 lb 12.8 oz)  02/03/18 81.8 kg (180 lb 4 oz)    PHYSICAL EXAM: General:  Elderly No resp difficulty HEENT: normal Neck: supple.JVP to jaw  R tunneled PICC site okCarotids 2+ bilat; no bruits. No lymphadenopathy or thryomegaly appreciated. Cor: PMI nondisplaced. Regular rate & rhythm. No rubs, gallops or murmurs.  Lungs: clear Abdomen: soft, nontender, + distended. No hepatosplenomegaly. No bruits or masses. Good bowel sounds. Extremities: no cyanosis, clubbing, rash, 2+ edema Neuro: alert & orientedx3, cranial nerves grossly intact. moves all 4 extremities w/o difficulty. Affect pleasant    ECG: Atrial flutter (atypical) with V-pacing at 64bpm   REDS 37%    ASSESSMENT & PLAN:   1. 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  - Echo 06/2017: EF 25-30%, mild LAE, mild/mod RV dilation, mod/severe RA. Echo 10/2017: EF 10% - Echo 12/19 EF 35-40%  -PYP scan strongly suggestive of TTR amyloid, SPEP with 0.8% m-spike, UPEP with  Bence Jones Protein positive, kappa type. 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 - Dr Marin Olp saw and thinks he does not have AL amyloid (just multiple myeloma) and  that he has TTR amyloid and should be treated with tafamidis before chemo. Tafamidis started on 8/2.  - Had been doing better until last week. Now with NYHA III-IIIB symptoms. ECG reviewed and PM interrogated and he has recurrent AFL since mid December and I think this is why he feels worse.  - Volume status elevated on exam.  - Continue torsemide 20 mg daily  - can increase to 40 daily for a day or two.  - Main issue is need to restore NSR. Plan DC-CV tomorrow. He has failed amio. AA options verylimited with low EF and CKD. Will d/w Dr. Curt Bears about possible ablation.  - Continue milrinone 0.125 mcg. Managed by [facility] If we can maintain NSR may be able to wean milrinone soon and perhaps that will also help reduce recurrence of AF.  - Continue tafamidis 80 mg daily - No ARB/entresto/spiro with for now with CKD - Have been hydralazine/imdur  2. CKD III, baseline ~1.6 - Creatinine 1.77 today  3. Atrial Flutter s/p DCCV 7/31, 8/2, 12/19/2017 -> junctional rhythm with symptomatic bradycardia -> MDT PPM - Failed amiodarone with recurrent nausea.   - Continue Eliquis 2.5 mg BID. Denies bleeding.  - Back in AFL today. See discussion above. Will discuss possible ablation with Dr. Curt Bears. (He has appt with him next week).   4. HL - Continue crestor.  5. [place] with Dr. Marin Olp  -Started on Bortezomib7/18/18andRevlimid7/23/19. - Dr Marin Olp thinks he does not have AL amyloid and that he does have TTR amyloid and should be treated with tafamidis before chemo. Now on Tafamidis.  - Continue follow up.   6. NASH with esophageal varices.  - Last [facility]3/8/2016at [facility] surveillance of Esophageal varices, Barrett's esophagus, and PMHx of Colon polyps. Showed two columns of small esophageal varices w/out stigmata, short segment Barrett's esophagus (without biopsies, due to presence of EVs), 5cm hiatal hernia, and hyperplastic small gastric nodules.  - Continue  Eliquis 2.5 mg BID for afib. Denies bleeding.   7. Hx of Rectal bleeding - EGD/Colon deferred with recent (2017) showing diverticulosis and hemorrhoids. - Denies bleeding.    8. Syncope,CHB - S/P Medtronic PPM.  - Stable.   Total time spent 35 minutes. Over half that time spent discussing above.    Glori Bickers, MD 05/13/18

## 2018-05-13 NOTE — Patient Instructions (Addendum)
EKG was done today.  Labs were done today. We will call you with any ABNORMAL results. No news is good news!  Your physician has recommended that you have a Cardioversion (DCCV). Electrical Cardioversion uses a jolt of electricity to your heart either through paddles or wired patches attached to your chest. This is a controlled, usually prescheduled, procedure. Defibrillation is done under light anesthesia in the hospital, and you usually go home the day of the procedure. This is done to get your heart back into a normal rhythm. You are not awake for the procedure. Please see the instruction sheet given to you today. THIS IS SCHEDULED FOR JAN 30th @ 2:30pm, PLEASE ARRIVE AT 1:00pm.  Your physician recommends that you schedule a follow-up appointment in: 1 month

## 2018-05-13 NOTE — Progress Notes (Signed)
ReDS Vest - 05/13/18 1500      ReDS Vest   MR   No  (Pended)     Estimated volume prior to reading  High  (Pended)     Fitting Posture  Sitting  (Pended)     Height Marker  Tall  (Pended)     Ruler Value  35  (Pended)     Center Strip  Aligned  (Pended)     ReDS Value  37  (Pended)

## 2018-05-14 ENCOUNTER — Other Ambulatory Visit (HOSPITAL_COMMUNITY): Payer: Self-pay | Admitting: Internal Medicine

## 2018-05-14 ENCOUNTER — Ambulatory Visit (HOSPITAL_COMMUNITY): Payer: Medicare HMO | Admitting: Certified Registered"

## 2018-05-14 ENCOUNTER — Encounter (HOSPITAL_COMMUNITY)
Admission: RE | Disposition: A | Discharge status: Home / Self Care | Payer: Self-pay | Source: Home / Self Care | Attending: Internal Medicine

## 2018-05-14 ENCOUNTER — Other Ambulatory Visit: Payer: Self-pay

## 2018-05-14 ENCOUNTER — Ambulatory Visit (HOSPITAL_COMMUNITY)
Admission: RE | Admit: 2018-05-14 | Discharge: 2018-05-14 | Disposition: A | Discharge status: Home / Self Care | Payer: Medicare HMO | Attending: Internal Medicine | Admitting: Internal Medicine

## 2018-05-14 ENCOUNTER — Encounter (HOSPITAL_COMMUNITY): Payer: Self-pay | Admitting: Certified Registered"

## 2018-05-14 DIAGNOSIS — I442 Atrioventricular block, complete: Secondary | ICD-10-CM | POA: Insufficient documentation

## 2018-05-14 DIAGNOSIS — N183 Chronic kidney disease, stage 3 (moderate): Secondary | ICD-10-CM | POA: Diagnosis not present

## 2018-05-14 DIAGNOSIS — I4891 Unspecified atrial fibrillation: Secondary | ICD-10-CM | POA: Diagnosis not present

## 2018-05-14 DIAGNOSIS — Z85828 Personal history of other malignant neoplasm of skin: Secondary | ICD-10-CM | POA: Insufficient documentation

## 2018-05-14 DIAGNOSIS — C9000 Multiple myeloma not having achieved remission: Secondary | ICD-10-CM | POA: Insufficient documentation

## 2018-05-14 DIAGNOSIS — K219 Gastro-esophageal reflux disease without esophagitis: Secondary | ICD-10-CM | POA: Diagnosis not present

## 2018-05-14 DIAGNOSIS — I5043 Acute on chronic combined systolic (congestive) and diastolic (congestive) heart failure: Secondary | ICD-10-CM | POA: Diagnosis not present

## 2018-05-14 DIAGNOSIS — Z79899 Other long term (current) drug therapy: Secondary | ICD-10-CM | POA: Diagnosis not present

## 2018-05-14 DIAGNOSIS — E785 Hyperlipidemia, unspecified: Secondary | ICD-10-CM | POA: Insufficient documentation

## 2018-05-14 DIAGNOSIS — K2270 Barrett's esophagus without dysplasia: Secondary | ICD-10-CM | POA: Insufficient documentation

## 2018-05-14 DIAGNOSIS — I4510 Unspecified right bundle-branch block: Secondary | ICD-10-CM | POA: Insufficient documentation

## 2018-05-14 DIAGNOSIS — I4892 Unspecified atrial flutter: Secondary | ICD-10-CM | POA: Insufficient documentation

## 2018-05-14 DIAGNOSIS — I5042 Chronic combined systolic (congestive) and diastolic (congestive) heart failure: Secondary | ICD-10-CM | POA: Insufficient documentation

## 2018-05-14 DIAGNOSIS — K7460 Unspecified cirrhosis of liver: Secondary | ICD-10-CM | POA: Diagnosis not present

## 2018-05-14 DIAGNOSIS — I130 Hypertensive heart and chronic kidney disease with heart failure and stage 1 through stage 4 chronic kidney disease, or unspecified chronic kidney disease: Secondary | ICD-10-CM | POA: Insufficient documentation

## 2018-05-14 DIAGNOSIS — Z87891 Personal history of nicotine dependence: Secondary | ICD-10-CM | POA: Diagnosis not present

## 2018-05-14 DIAGNOSIS — E119 Type 2 diabetes mellitus without complications: Secondary | ICD-10-CM | POA: Diagnosis not present

## 2018-05-14 DIAGNOSIS — E7800 Pure hypercholesterolemia, unspecified: Secondary | ICD-10-CM | POA: Diagnosis not present

## 2018-05-14 DIAGNOSIS — Z66 Do not resuscitate: Secondary | ICD-10-CM | POA: Diagnosis not present

## 2018-05-14 DIAGNOSIS — I429 Cardiomyopathy, unspecified: Secondary | ICD-10-CM | POA: Diagnosis not present

## 2018-05-14 DIAGNOSIS — E1122 Type 2 diabetes mellitus with diabetic chronic kidney disease: Secondary | ICD-10-CM | POA: Insufficient documentation

## 2018-05-14 DIAGNOSIS — Z7901 Long term (current) use of anticoagulants: Secondary | ICD-10-CM | POA: Insufficient documentation

## 2018-05-14 DIAGNOSIS — I428 Other cardiomyopathies: Secondary | ICD-10-CM | POA: Diagnosis not present

## 2018-05-14 DIAGNOSIS — D631 Anemia in chronic kidney disease: Secondary | ICD-10-CM | POA: Diagnosis not present

## 2018-05-14 HISTORY — PX: CARDIOVERSION: SHX1299

## 2018-05-14 SURGERY — CARDIOVERSION
Anesthesia: General

## 2018-05-14 MED ORDER — SODIUM CHLORIDE 0.9 % IV SOLN
INTRAVENOUS | Status: DC
  Administered 2018-05-14: 14:00:00 via INTRAVENOUS

## 2018-05-14 MED ORDER — LIDOCAINE 2% (20 MG/ML) 5 ML SYRINGE
INTRAMUSCULAR | Status: DC | PRN
  Administered 2018-05-14: 60 mg via INTRAVENOUS

## 2018-05-14 MED ORDER — PROPOFOL 10 MG/ML IV BOLUS
INTRAVENOUS | Status: DC | PRN
  Administered 2018-05-14 (×3): 20 mg via INTRAVENOUS

## 2018-05-14 NOTE — Discharge Instructions (Signed)
Electrical Cardioversion, Care After °This sheet gives you information about how to care for yourself after your procedure. Your health care provider may also give you more specific instructions. If you have problems or questions, contact your health care provider. °What can I expect after the procedure? °After the procedure, it is common to have: °· Some redness on the skin where the shocks were given. °Follow these instructions at home: ° °· Do not drive for 24 hours if you were given a medicine to help you relax (sedative). °· Take over-the-counter and prescription medicines only as told by your health care provider. °· Ask your health care provider how to check your pulse. Check it often. °· Rest for 48 hours after the procedure or as told by your health care provider. °· Avoid or limit your caffeine use as told by your health care provider. °Contact a health care provider if: °· You feel like your heart is beating too quickly or your pulse is not regular. °· You have a serious muscle cramp that does not go away. °Get help right away if: ° °· You have discomfort in your chest. °· You are dizzy or you feel faint. °· You have trouble breathing or you are short of breath. °· Your speech is slurred. °· You have trouble moving an arm or leg on one side of your body. °· Your fingers or toes turn cold or blue. °This information is not intended to replace advice given to you by your health care provider. Make sure you discuss any questions you have with your health care provider. °Document Released: 01/20/2013 Document Revised: 11/03/2015 Document Reviewed: 10/06/2015 °Elsevier Interactive Patient Education © 2019 Elsevier Inc. ° °

## 2018-05-14 NOTE — Anesthesia Postprocedure Evaluation (Signed)
Anesthesia Post Note  Patient: Dillon Andrews  Procedure(s) Performed: CARDIOVERSION (N/A )     Patient location during evaluation: PACU Anesthesia Type: General Level of consciousness: awake Pain management: pain level controlled Vital Signs Assessment: post-procedure vital signs reviewed and stable Respiratory status: spontaneous breathing, nonlabored ventilation, respiratory function stable and patient connected to nasal cannula oxygen Cardiovascular status: blood pressure returned to baseline and stable Postop Assessment: no apparent nausea or vomiting Anesthetic complications: no    Last Vitals:  Vitals:   05/14/18 1453 05/14/18 1503  BP: (!) 88/57 111/60  Pulse: 69 80  Resp: (!) 21 (!) 22  Temp:    SpO2: 91% (!) 89%    Last Pain:  Vitals:   05/14/18 1503  TempSrc:   PainSc: 0-No pain                 Ryan P Ellender

## 2018-05-14 NOTE — Progress Notes (Signed)
Spoke with Bensimhon about patient having home oxygen and needing an order since patient does not have home oxygen. MD stated that as long as the patient does not drop below 88 SpO2 he wont need home oxygen. Patient sustained above 88 percent.

## 2018-05-14 NOTE — Interval H&P Note (Signed)
History and Physical Interval Note:  05/14/2018 2:23 PM  Dillon Andrews  has presented today for surgery, with the diagnosis of atrial flutter  The various methods of treatment have been discussed with the patient and family. After consideration of risks, benefits and other options for treatment, the patient has consented to  Procedure(s): CARDIOVERSION (N/A) as a surgical intervention .  The patient's history has been reviewed, patient examined, no change in status, stable for surgery.  I have reviewed the patient's chart and labs.  Questions were answered to the patient's satisfaction.     Charod Slawinski

## 2018-05-14 NOTE — CV Procedure (Signed)
    DIRECT CURRENT CARDIOVERSION  NAME:  Dillon Andrews   MRN: [id number] DOB:  October 05, 1940   ADMIT DATE: 05/14/2018   INDICATIONS: Atrial filutter   PROCEDURE:   Informed consent was obtained prior to the procedure. The risks, benefits and alternatives for the procedure were discussed and the patient comprehended these risks. Once an appropriate time out was taken, the patient had the defibrillator pads placed in the anterior and posterior position. The patient then underwent sedation by the anesthesia service. Once an appropriate level of sedation was achieved, the patient received a single biphasic, synchronized 200J shock with prompt conversion to sinus rhythm. No apparent complications.  Glori Bickers, MD  2:37 PM

## 2018-05-14 NOTE — Anesthesia Preprocedure Evaluation (Addendum)
Anesthesia Evaluation  Patient identified by MRN, date of birth, ID band Patient awake    Reviewed: Allergy & Precautions, NPO status , Patient's Chart, lab work & pertinent test results  Airway Mallampati: II  TM Distance: >3 FB Neck ROM: Full    Dental  (+) Missing   Pulmonary former smoker,    Pulmonary exam normal breath sounds clear to auscultation       Cardiovascular hypertension, +CHF  Normal cardiovascular exam+ dysrhythmias Atrial Fibrillation  Rhythm:Regular Rate:Normal  ECG: v-paced. Rate 64   Neuro/Psych negative neurological ROS  negative psych ROS   GI/Hepatic Neg liver ROS, GERD  Medicated and Controlled,Esophageal varices   Endo/Other  diabetes  Renal/GU Renal InsufficiencyRenal disease     Musculoskeletal negative musculoskeletal ROS (+)   Abdominal   Peds  Hematology  (+) anemia , HLD   Anesthesia Other Findings a fib  Reproductive/Obstetrics                            Anesthesia Physical Anesthesia Plan  ASA: III  Anesthesia Plan: General   Post-op Pain Management:    Induction: Intravenous  PONV Risk Score and Plan: 2 and Propofol infusion and Treatment may vary due to age or medical condition  Airway Management Planned: Mask  Additional Equipment:   Intra-op Plan:   Post-operative Plan:   Informed Consent: I have reviewed the patients History and Physical, chart, labs and discussed the procedure including the risks, benefits and alternatives for the proposed anesthesia with the patient or authorized representative who has indicated his/her understanding and acceptance.     Dental advisory given  Plan Discussed with: CRNA  Anesthesia Plan Comments:         Anesthesia Quick Evaluation

## 2018-05-14 NOTE — Transfer of Care (Signed)
Immediate Anesthesia Transfer of Care Note  Patient: Dillon Andrews  Procedure(s) Performed: CARDIOVERSION (N/A )  Patient Location: Endoscopy Unit  Anesthesia Type:General  Level of Consciousness: alert  and sedated  Airway & Oxygen Therapy: Patient connected to nasal cannula oxygen  Post-op Assessment: Post -op Vital signs reviewed and stable  Post vital signs: stable  Last Vitals:  Vitals Value Taken Time  BP    Temp    Pulse    Resp    SpO2      Last Pain:  Vitals:   05/14/18 1325  TempSrc: Oral  PainSc: 0-No pain         Complications: No apparent anesthesia complications

## 2018-05-15 ENCOUNTER — Encounter (HOSPITAL_COMMUNITY): Payer: Self-pay | Admitting: Internal Medicine

## 2018-05-18 ENCOUNTER — Other Ambulatory Visit: Payer: Self-pay | Admitting: Family Medicine

## 2018-05-18 DIAGNOSIS — G4701 Insomnia due to medical condition: Secondary | ICD-10-CM

## 2018-05-18 NOTE — Telephone Encounter (Signed)
Forwarding to PCP.

## 2018-05-19 ENCOUNTER — Encounter: Payer: Self-pay | Admitting: Family

## 2018-05-19 ENCOUNTER — Inpatient Hospital Stay (HOSPITAL_BASED_OUTPATIENT_CLINIC_OR_DEPARTMENT_OTHER): Payer: Medicare HMO | Admitting: Family

## 2018-05-19 ENCOUNTER — Inpatient Hospital Stay: Payer: Medicare HMO | Attending: Hematology & Oncology

## 2018-05-19 ENCOUNTER — Ambulatory Visit: Payer: Medicare HMO | Admitting: Hematology & Oncology

## 2018-05-19 ENCOUNTER — Other Ambulatory Visit: Payer: Medicare HMO

## 2018-05-19 ENCOUNTER — Other Ambulatory Visit: Payer: Self-pay

## 2018-05-19 VITALS — BP 111/78 | HR 82 | Temp 97.8°F | Resp 17

## 2018-05-19 DIAGNOSIS — R0602 Shortness of breath: Secondary | ICD-10-CM

## 2018-05-19 DIAGNOSIS — Z950 Presence of cardiac pacemaker: Secondary | ICD-10-CM

## 2018-05-19 DIAGNOSIS — J029 Acute pharyngitis, unspecified: Secondary | ICD-10-CM

## 2018-05-19 DIAGNOSIS — C9000 Multiple myeloma not having achieved remission: Secondary | ICD-10-CM | POA: Diagnosis not present

## 2018-05-19 DIAGNOSIS — Z7189 Other specified counseling: Secondary | ICD-10-CM

## 2018-05-19 DIAGNOSIS — R778 Other specified abnormalities of plasma proteins: Secondary | ICD-10-CM

## 2018-05-19 LAB — CMP (CANCER CENTER ONLY)
ALT: 27 U/L (ref 0–44)
AST: 51 U/L — ABNORMAL HIGH (ref 15–41)
Albumin: 3.8 g/dL (ref 3.5–5.0)
Alkaline Phosphatase: 182 U/L — ABNORMAL HIGH (ref 38–126)
Anion gap: 6 (ref 5–15)
BUN: 22 mg/dL (ref 8–23)
CO2: 37 mmol/L — ABNORMAL HIGH (ref 22–32)
Calcium: 9 mg/dL (ref 8.9–10.3)
Chloride: 100 mmol/L (ref 98–111)
Creatinine: 1.66 mg/dL — ABNORMAL HIGH (ref 0.61–1.24)
GFR, Est AFR Am: 45 mL/min — ABNORMAL LOW (ref >60)
GFR, Estimated: 39 mL/min — ABNORMAL LOW (ref >60)
Glucose, Bld: 126 mg/dL — ABNORMAL HIGH (ref 70–99)
Potassium: 4.1 mmol/L (ref 3.5–5.1)
Sodium: 143 mmol/L (ref 135–145)
Total Bilirubin: 1.4 mg/dL — ABNORMAL HIGH (ref 0.3–1.2)
Total Protein: 6.5 g/dL (ref 6.5–8.1)

## 2018-05-19 LAB — CBC WITH DIFFERENTIAL (CANCER CENTER ONLY)
Abs Immature Granulocytes: 0.02 10*3/uL (ref 0.00–0.07)
Basophils Absolute: 0 10*3/uL (ref 0.0–0.1)
Basophils Relative: 0 %
EOS ABS: 0 10*3/uL (ref 0.0–0.5)
EOS PCT: 1 %
HCT: 43.4 % (ref 39.0–52.0)
Hemoglobin: 13.8 g/dL (ref 13.0–17.0)
Immature Granulocytes: 0 %
Lymphocytes Relative: 19 %
Lymphs Abs: 0.9 10*3/uL (ref 0.7–4.0)
MCH: 31.7 pg (ref 26.0–34.0)
MCHC: 31.8 g/dL (ref 30.0–36.0)
MCV: 99.8 fL (ref 80.0–100.0)
Monocytes Absolute: 0.4 10*3/uL (ref 0.1–1.0)
Monocytes Relative: 9 %
Neutro Abs: 3.3 10*3/uL (ref 1.7–7.7)
Neutrophils Relative %: 71 %
Platelet Count: 112 10*3/uL — ABNORMAL LOW (ref 150–400)
RBC: 4.35 MIL/uL (ref 4.22–5.81)
RDW: 19.1 % — ABNORMAL HIGH (ref 11.5–15.5)
WBC Count: 4.7 10*3/uL (ref 4.0–10.5)
nRBC: 0 % (ref 0.0–0.2)

## 2018-05-19 NOTE — Progress Notes (Signed)
Hematology and Oncology Follow Up Visit  ADETOKUNBO MCCADDEN [id number] 1940/12/19 78 y.o. 05/19/2018   Principle Diagnosis:  Kappa light chain myeloma  Current Therapy:   RVD - Cycle 1 started 10/30/2017 - stopped on 11/2017   Interim History: Mr. Rosevear is here today with his son for follow-up. He is doing fairly well now. He was cardioverted last week by Dr. [name] for breakthrough atrial flutter. He follows up with his office in March.  He states that he follows up with his electrophysiologist tomorrow and they plan to make some changes to the settings on his pacemaker.  He still feels that he has a little flutter in his chest.  He has had a cough with chest congestion (white phlegm) that he feels is getting better. He has some SOB with over exertion.  He has occasional dizziness if he gets up and tries to walk too quickly. He has been using his rolling walker when ambulating for support.  24 urine kappa light chains were 14.00 mg/dL in November. M-spike at that time was 0.6 and serum kappa light chains 8.43 mg/dL.  No falls or syncopal episodes to report. No fever, chills, n/v, rash, chest pain, abdominal pain or changes in bowel or bladder habits at this time.  He has an appointment with dermatology this week on Thursday for lesions on his right shoulder, right cheek and right hand.  The 2+ pitting edema in his feet and ankles is stable. Pedal pulses 2+. He is taking his Torsemide daily as prescribed.  No tenderness, numbness or tingling in his extremities at this time.  No lymphadenopathy noted on exam.  No episodes of bleeding, no bruising or petechiae.  He states that he had no appetite for 3 days but this is now improving. He is staying hydrated appropriately and his weight is stable.   ECOG Performance Status: 1 - Symptomatic but completely ambulatory  Medications:  Allergies as of 05/19/2018   No Known Allergies     Medication List       Accurate as of May 19, 2018  10:28 AM. Always use your most recent med list.        apixaban 2.5 MG Tabs tablet Commonly known as:  ELIQUIS Take 1 tablet (2.5 mg total) by mouth 2 (two) times daily.   dronabinol 5 MG capsule Commonly known as:  MARINOL TAKE 1 CAPSULE(5 MG) BY MOUTH TWICE DAILY BEFORE LUNCH AND SUPPER   gabapentin 800 MG tablet Commonly known as:  NEURONTIN TAKE 1 TABLET BY MOUTH AT BEDTIME   glucose blood test strip Use as instructed   hydrocortisone 25 MG suppository Commonly known as:  ANUSOL-HC Place 1 suppository (25 mg total) rectally 2 (two) times daily.   Melatonin 5 MG Tabs Take 5 mg by mouth at bedtime.   milrinone 20 MG/100 ML Soln infusion Commonly known as:  PRIMACOR Inject 0.0099 mg/min into the vein continuous.   omeprazole 40 MG capsule Commonly known as:  PRILOSEC TAKE 1 CAPSULE(40 MG) BY MOUTH DAILY   ondansetron 4 MG tablet Commonly known as:  ZOFRAN TAKE 1 TABLET(4 MG) BY MOUTH EVERY 8 HOURS AS NEEDED FOR NAUSEA OR VOMITING   polyethylene glycol packet Commonly known as:  MIRALAX / GLYCOLAX Take 17 g by mouth daily.   potassium chloride SA 20 MEQ tablet Commonly known as:  K-DUR,KLOR-CON Take 20 mEq by mouth daily.   rosuvastatin 10 MG tablet Commonly known as:  CRESTOR TAKE 1 TABLET BY MOUTH ONCE DAILY  senna-docusate 8.6-50 MG tablet Commonly known as:  Senokot-S Take 2 tablets by mouth 2 (two) times daily.   sertraline 25 MG tablet Commonly known as:  ZOLOFT TAKE 1 TABLET BY MOUTH EVERY DAY   torsemide 20 MG tablet Commonly known as:  DEMADEX Take 1 tablet (20 mg total) by mouth daily. Can take 20 mg extra as needed.   traZODone 50 MG tablet Commonly known as:  DESYREL TAKE 1 TABLET(50 MG) BY MOUTH AT BEDTIME   vitamin B-12 500 MCG tablet Commonly known as:  CYANOCOBALAMIN Take 500 mcg by mouth every other day.   VYNDAQEL 20 MG Caps Generic drug:  Tafamidis Meglumine (Cardiac) Take 80 mg by mouth daily. (4 capsules)   zolpidem 5 MG  tablet Commonly known as:  AMBIEN TAKE 1 TABLET(5 MG) BY MOUTH AT BEDTIME       Allergies: No Known Allergies  Past Medical History, Surgical history, Social history, and Family History were reviewed and updated.  Review of Systems: All other 10 point review of systems is negative.   Physical Exam:  vitals were not taken for this visit.   Wt Readings from Last 3 Encounters:  05/14/18 183 lb 9.6 oz (83.3 kg)  05/13/18 190 lb 9.6 oz (86.5 kg)  03/16/18 184 lb 12.8 oz (83.8 kg)    Ocular: Sclerae unicteric, pupils equal, round and reactive to light Ear-nose-throat: Oropharynx clear, dentition fair Lymphatic: No cervical, supraclavicular or axillary adenopathy Lungs no rales or rhonchi, good excursion bilaterally Heart regular rate and rhythm, no murmur appreciated Abd soft, nontender, positive bowel sounds, no liver or spleen tip palpated on exam, no fluid wave  MSK no focal spinal tenderness, no joint edema Neuro: non-focal, well-oriented, appropriate affect Breasts: Deferred   Lab Results  Component Value Date   WBC 6.2 05/13/2018   HGB 12.6 (L) 05/13/2018   HCT 39.1 05/13/2018   MCV 99.7 05/13/2018   PLT PLATELET CLUMPS NOTED ON SMEAR, UNABLE TO ESTIMATE 05/13/2018   Lab Results  Component Value Date   FERRITIN 82 02/17/2018   IRON 82 02/17/2018   TIBC 319 02/17/2018   UIBC 237 02/17/2018   IRONPCTSAT 26 02/17/2018   Lab Results  Component Value Date   RETICCTPCT 0.9 02/17/2018   RBC 3.92 (L) 05/13/2018   Lab Results  Component Value Date   KPAFRELGTCHN 84.3 (H) 02/17/2018   LAMBDASER 32.5 (H) 02/17/2018   KAPLAMBRATIO 14.94 (H) 02/19/2018   Lab Results  Component Value Date   IGGSERUM 1,104 02/17/2018   IGA 269 02/17/2018   IGMSERUM 569 (H) 02/17/2018   Lab Results  Component Value Date   TOTALPROTELP 6.6 02/17/2018   ALBUMINELP 3.4 02/17/2018   A1GS 0.3 02/17/2018   A2GS 0.6 02/17/2018   BETS 0.9 02/17/2018   GAMS 1.4 02/17/2018   MSPIKE 0.6  (H) 02/17/2018   SPEI Comment 06/27/2017     Chemistry      Component Value Date/Time   NA 138 05/13/2018 1535   NA 148 (H) 06/12/2017 0000   K 3.7 05/13/2018 1535   CL 98 05/13/2018 1535   CO2 29 05/13/2018 1535   BUN 17 05/13/2018 1535   BUN 38 (H) 06/12/2017 0000   CREATININE 1.77 (H) 05/13/2018 1535   CREATININE 1.60 (H) 02/17/2018 1141      Component Value Date/Time   CALCIUM 9.0 05/13/2018 1535   ALKPHOS 201 (H) 02/17/2018 1141   AST 45 (H) 02/17/2018 1141   ALT 39 02/17/2018 1141   BILITOT 1.3  02/17/2018 1141       Impression and Plan: Mr. Mariani is a very pleasant 78 yo caucasain gentleman with kappa light chain myeloma. For now we are just following along with him.  He has a lot of cardiac issues and was recently cardioverted again. He goes tomorrow to have his pacemaker settings adjusted. His protein studies are pending. No 24 hour urine needed this visit per Dr. Marin Olp.  We will plan to see him back in another 3 months for follow-up.  They will contact our office with any questions or concerns. We can certainly see him sooner if need be.   Laverna Peace, NP 2/4/202010:28 AM

## 2018-05-20 ENCOUNTER — Encounter: Payer: Self-pay | Admitting: Cardiology

## 2018-05-20 ENCOUNTER — Encounter: Payer: Self-pay | Admitting: *Deleted

## 2018-05-20 ENCOUNTER — Ambulatory Visit: Payer: Medicare HMO | Admitting: Cardiology

## 2018-05-20 VITALS — BP 124/72 | HR 73 | Ht 70.0 in | Wt 179.0 lb

## 2018-05-20 DIAGNOSIS — I442 Atrioventricular block, complete: Secondary | ICD-10-CM | POA: Diagnosis not present

## 2018-05-20 LAB — IGG, IGA, IGM
IGM (IMMUNOGLOBULIN M), SRM: 566 mg/dL — AB (ref 15–143)
IgA: 265 mg/dL (ref 61–437)
IgG (Immunoglobin G), Serum: 1067 mg/dL (ref 700–1600)

## 2018-05-20 LAB — KAPPA/LAMBDA LIGHT CHAINS
Kappa free light chain: 112.8 mg/L — ABNORMAL HIGH (ref 3.3–19.4)
Kappa, lambda light chain ratio: 2.32 — ABNORMAL HIGH (ref 0.26–1.65)
Lambda free light chains: 48.7 mg/L — ABNORMAL HIGH (ref 5.7–26.3)

## 2018-05-20 NOTE — Patient Instructions (Addendum)
Medication Instructions:  Your physician recommends that you continue on your current medications as directed. Please refer to the Current Medication list given to you today.  * If you need a refill on your cardiac medications before your next appointment, please call your pharmacy.   Labwork: None ordered  Testing/Procedures: Your physician has recommended that you have an ablation. Catheter ablation is a medical procedure used to treat some cardiac arrhythmias (irregular heartbeats). During catheter ablation, a long, thin, flexible tube is put into a blood vessel in your groin (upper thigh), or neck. This tube is called an ablation catheter. It is then guided to your heart through the blood vessel. Radio frequency waves destroy small areas of heart tissue where abnormal heartbeats may cause an arrhythmia to start. Please see the instruction sheet given to you today.  Instructions for your ablation: 1. Please arrive at the [facility], Main Entrance "A", of [facility] at 10:30 a.m. on 05/22/18. 2. Do not eat or drink after midnight the night prior to the procedure. 3. Do not miss any doses of ELIQUIS prior to the morning of the procedure.  4. Do not take any medications the morning of the procedure. 5. Both of your groins will need to be shaved for this procedure (if needed). We ask that you do this yourself at home 1-2 days prior to the procedure.  If you are unable/uncomfortable to do yourself, the hospital staff will shave you the day of your procedure (if needed). 6. Plan for an overnight stay in the hospital. 7. You will need someone to drive you home at discharge.   Follow-Up: Your physician recommends that you schedule a follow-up appointment in: 4 weeks, after your procedure on 05/22/18, with Dr. Curt Bears.  *Please note that any paperwork needing to be filled out by the provider will need to be addressed at the front desk prior to seeing the provider. Please note that any FMLA,  disability or other documents regarding health condition is subject to a $25.00 charge that must be received prior to completion of paperwork in the form of a money order or check.  Thank you for choosing [facility]!!   Trinidad Curet, RN [phone]  Any Other Special Instructions Will Be Listed Below (If Applicable).   Cardiac Ablation Cardiac ablation is a procedure to disable (ablate) a small amount of heart tissue in very specific places. The heart has many electrical connections. Sometimes these connections are abnormal and can cause the heart to beat very fast or irregularly. Ablating some of the problem areas can improve the heart rhythm or return it to normal. Ablation may be done for people who:  Have Wolff-Parkinson-White syndrome.  Have fast heart rhythms (tachycardia).  Have taken medicines for an abnormal heart rhythm (arrhythmia) that were not effective or caused side effects.  Have a high-risk heartbeat that may be life-threatening. During the procedure, a small incision is made in the neck or the groin, and a long, thin, flexible tube (catheter) is inserted into the incision and moved to the heart. Small devices (electrodes) on the tip of the catheter will send out electrical currents. A type of X-ray (fluoroscopy) will be used to help guide the catheter and to provide images of the heart. Tell a health care provider about:  Any allergies you have.  All medicines you are taking, including vitamins, herbs, eye drops, creams, and over-the-counter medicines.  Any problems you or family members have had with anesthetic medicines.  Any blood  disorders you have.  Any surgeries you have had.  Any medical conditions you have, such as kidney failure.  Whether you are pregnant or may be pregnant. What are the risks? Generally, this is a safe procedure. However, problems may occur, including:  Infection.  Bruising and bleeding at the catheter insertion  site.  Bleeding into the chest, especially into the sac that surrounds the heart. This is a serious complication.  Stroke or blood clots.  Damage to other structures or organs.  Allergic reaction to medicines or dyes.  Need for a permanent pacemaker if the normal electrical system is damaged. A pacemaker is a small computer that sends electrical signals to the heart and helps your heart beat normally.  The procedure not being fully effective. This may not be recognized until months later. Repeat ablation procedures are sometimes required. What happens before the procedure?  Follow instructions from your health care provider about eating or drinking restrictions.  Ask your health care provider about: ? Changing or stopping your regular medicines. This is especially important if you are taking diabetes medicines or blood thinners. ? Taking medicines such as aspirin and ibuprofen. These medicines can thin your blood. Do not take these medicines before your procedure if your health care provider instructs you not to.  Plan to have someone take you home from the hospital or clinic.  If you will be going home right after the procedure, plan to have someone with you for 24 hours. What happens during the procedure?  To lower your risk of infection: ? Your health care team will wash or sanitize their hands. ? Your skin will be washed with soap. ? Hair may be removed from the incision area.  An IV tube will be inserted into one of your veins.  You will be given a medicine to help you relax (sedative).  The skin on your neck or groin will be numbed.  An incision will be made in your neck or your groin.  A needle will be inserted through the incision and into a large vein in your neck or groin.  A catheter will be inserted into the needle and moved to your heart.  Dye may be injected through the catheter to help your surgeon see the area of the heart that needs treatment.  Electrical  currents will be sent from the catheter to ablate heart tissue in desired areas. There are three types of energy that may be used to ablate heart tissue: ? Heat (radiofrequency energy). ? Laser energy. ? Extreme cold (cryoablation).  When the necessary tissue has been ablated, the catheter will be removed.  Pressure will be held on the catheter insertion area to prevent excessive bleeding.  A bandage (dressing) will be placed over the catheter insertion area. The procedure may vary among health care providers and hospitals. What happens after the procedure?  Your blood pressure, heart rate, breathing rate, and blood oxygen level will be monitored until the medicines you were given have worn off.  Your catheter insertion area will be monitored for bleeding. You will need to lie still for a few hours to ensure that you do not bleed from the catheter insertion area.  Do not drive for 24 hours or as long as directed by your health care provider. Summary  Cardiac ablation is a procedure to disable (ablate) a small amount of heart tissue in very specific places. Ablating some of the problem areas can improve the heart rhythm or return it to normal.  During the procedure, electrical currents will be sent from the catheter to ablate heart tissue in desired areas. This information is not intended to replace advice given to you by your health care provider. Make sure you discuss any questions you have with your health care provider. Document Released: 08/18/2008 Document Revised: 02/19/2016 Document Reviewed: 02/19/2016 Elsevier Interactive Patient Education  2019 [employer].

## 2018-05-20 NOTE — Progress Notes (Signed)
Electrophysiology Office Note   Date:  05/20/2018   ID:  Dillon, Andrews December 30, 1940, MRN [id number]  PCP:  Dillon Maw, MD  Cardiologist:  Dillon Andrews Primary Electrophysiologist:   Dillon Leeds, MD    No chief complaint on file.    History of Present Illness: Dillon Andrews is a 78 y.o. male who is being seen today for the evaluation of complete heart block at the request of Dillon Andrews,*. Presenting today for electrophysiology evaluation.  He has a history of atypical atrial flutter, chronic systolic heart failure due to nonischemic cardiomyopathy, presumed Dillon Andrews with esophageal varices, hypertension, hyperlipidemia, diabetes.  He presented to the hospital September 2019 and had a Medtronic dual-chamber pacemaker implanted 01/08/2018.    Today, he denies symptoms of palpitations, chest pain, PND, lower extremity edema, claudication, dizziness, presyncope, syncope, bleeding, or neurologic sequela. The patient is tolerating medications without difficulties.  Significant amounts of shortness of breath and orthopnea.  He has had multiple cardioversions for atrial flutter and has unfortunately gone back into atrial flutter quite quickly.  His ejection fraction is fortunately improved to 30 to 35%.     Past Medical History:  Diagnosis Date  . Anemia 11/2016  . Arthritis    "some; hands/fingers" (06/25/2017)  . Atrial tachycardia ([place])   . Barrett's esophagus   . Basal cell carcinoma    "face, arms, hands" (06/25/2017)  . Chicken pox   . Cholelithiasis 11/2016   S/P LAP CHOLE  . Chronic combined systolic and diastolic CHF (congestive heart failure) ([place])   . CKD (chronic kidney disease), stage III ([place])   . Esophageal varices (HCC)    Secondary to NAFLD  . Family history of adverse reaction to anesthesia    "daughter's sat drop very low; last OR had to be reintubated" (06/25/2017)  . GERD (gastroesophageal reflux disease)   . Goals of care,  counseling/discussion 10/22/2017  . High cholesterol   . Hypertension   . Kappa light chain myeloma ([place]) 10/22/2017  . Mitral regurgitation   . NAFLD (nonalcoholic fatty liver disease)   . NICM (nonischemic cardiomyopathy) ([place])    Echo 06/2017: Severe LVH.  EF 25 to 30% with mild HK of the baseline after septal wall.  Moderate to severely dilated right atrium, moderately dilated RV. (Concern fr ? amyloidosis).  . NSVT (nonsustained ventricular tachycardia) ([place])   . Pneumonia 09/2016  . RBBB   . Tricuspid regurgitation   . Type II diabetes mellitus ([place])    Past Surgical History:  Procedure Laterality Date  . BACK SURGERY    . BASAL CELL CARCINOMA EXCISION Right    temple; "had to do skin graft"  . CARDIAC CATHETERIZATION  09/2016   Minimal, non-occlusive CAD, EF 45% [facility])  . CARDIOVERSION N/A 11/10/2017   Procedure: CARDIOVERSION;  Surgeon: Jolaine Artist, MD;  Location: [facility] ENDOSCOPY;  Service: Cardiovascular;  Laterality: N/A;  . CARDIOVERSION N/A 11/14/2017   Procedure: CARDIOVERSION;  Surgeon: Jolaine Artist, MD;  Location: [facility] ENDOSCOPY;  Service: Cardiovascular;  Laterality: N/A;  . CARDIOVERSION N/A 12/19/2017   Procedure: CARDIOVERSION;  Surgeon: Jolaine Artist, MD;  Location: [facility] ENDOSCOPY;  Service: Cardiovascular;  Laterality: N/A;  . CARDIOVERSION N/A 05/14/2018   Procedure: CARDIOVERSION;  Surgeon: Jolaine Artist, MD;  Location: [place];  Service: Cardiovascular;  Laterality: N/A;  . CARPAL TUNNEL RELEASE Bilateral   . CATARACT EXTRACTION W/ INTRAOCULAR LENS  IMPLANT, BILATERAL Bilateral   . CHOLECYSTECTOMY N/A 11/22/2016  Procedure: LAPAROSCOPIC CHOLECYSTECTOMY;  Surgeon: Coralie Keens, MD;  Location: [place];  Service: General;  Laterality: N/A;  . IR FLUORO GUIDE CV LINE RIGHT  11/26/2017  . IR US GUIDE VASC ACCESS RIGHT  11/26/2017  . JOINT REPLACEMENT    . LUMBAR DISC SURGERY  X 2  . PACEMAKER IMPLANT N/A 01/08/2018   Procedure:  PACEMAKER IMPLANT;  Surgeon: Constance Haw, MD;  Location: [place] CV LAB;  Service: Cardiovascular;  Laterality: N/A;  . TEE WITHOUT CARDIOVERSION N/A 11/10/2017   Procedure: TRANSESOPHAGEAL ECHOCARDIOGRAM (TEE);  Surgeon: Jolaine Artist, MD;  Location: [facility] ENDOSCOPY;  Service: Cardiovascular;  Laterality: N/A;  . TONSILLECTOMY AND ADENOIDECTOMY    . TOTAL KNEE ARTHROPLASTY Left 2010  . TRANSESOPHAGEAL ECHOCARDIOGRAM  09/2016   Mild global LV hypokinesis. Moderate MR. Moderate TR. [facility])  . TRANSTHORACIC ECHOCARDIOGRAM  10/2016   [facility]) EF 35-40% with moderate HK. Normal RV function. Moderately severe TR.  Marland Kitchen TRANSTHORACIC ECHOCARDIOGRAM  03/2017; 06/2017    A) ([place]) mild concentric LVH. EF of 40-45%. Diffuse HK with no RWMA. GR 2 DD. Septal dyssynergy. Mild MR, moderate TR. Severe RA dilation..;; B) Severe LVH.  EF 25 to 30% with mild HK of Septal wall.  Mod-severely dilated RA & Mod dilated RV.     Current Outpatient Medications  Medication Sig Dispense Refill  . apixaban (ELIQUIS) 2.5 MG TABS tablet Take 1 tablet (2.5 mg total) by mouth 2 (two) times daily. 60 tablet 11  . cephALEXin (KEFLEX) 500 MG capsule TK 1 C PO TID    . dronabinol (MARINOL) 5 MG capsule TAKE 1 CAPSULE(5 MG) BY MOUTH TWICE DAILY BEFORE LUNCH AND SUPPER 60 capsule 2  . gabapentin (NEURONTIN) 800 MG tablet TAKE 1 TABLET BY MOUTH AT BEDTIME (Patient taking differently: 400 mg at bedtime. ) 90 tablet 1  . glucose blood test strip Use as instructed 100 each 12  . hydrocortisone (ANUSOL-HC) 25 MG suppository Place 1 suppository (25 mg total) rectally 2 (two) times daily. 12 suppository 0  . Melatonin 5 MG TABS Take 5 mg by mouth at bedtime.     . milrinone (PRIMACOR) 20 MG/100 ML SOLN infusion Inject 0.0099 mg/min into the vein continuous. 100 mL 0  . omeprazole (PRILOSEC) 40 MG capsule TAKE 1 CAPSULE(40 MG) BY MOUTH DAILY 90 capsule 1  . ondansetron (ZOFRAN) 4 MG tablet  TAKE 1 TABLET(4 MG) BY MOUTH EVERY 8 HOURS AS NEEDED FOR NAUSEA OR VOMITING 30 tablet 2  . polyethylene glycol (MIRALAX / GLYCOLAX) packet Take 17 g by mouth daily. (Patient taking differently: Take 17 g by mouth daily as needed. ) 14 each 0  . potassium chloride SA (K-DUR,KLOR-CON) 20 MEQ tablet Take 20 mEq by mouth daily.     . rosuvastatin (CRESTOR) 10 MG tablet TAKE 1 TABLET BY MOUTH ONCE DAILY 90 tablet 1  . SENNA S 8.6-50 MG tablet Take 1 tablet by mouth 2 (two) times daily as needed.     . sertraline (ZOLOFT) 25 MG tablet TAKE 1 TABLET BY MOUTH EVERY DAY 90 tablet 0  . torsemide (DEMADEX) 20 MG tablet Take 1 tablet (20 mg total) by mouth daily. Can take 20 mg extra as needed. 40 tablet 3  . traZODone (DESYREL) 50 MG tablet TAKE 1 TABLET(50 MG) BY MOUTH AT BEDTIME 90 tablet 1  . vitamin B-12 (CYANOCOBALAMIN) 500 MCG tablet Take 500 mcg by mouth every other day.    Marland Kitchen VYNDAQEL 20 MG  CAPS Take 80 mg by mouth daily. (4 capsules) (Patient taking differently: Take 80 mg by mouth every evening. ) 120 capsule   . zolpidem (AMBIEN) 5 MG tablet TAKE 1 TABLET(5 MG) BY MOUTH AT BEDTIME 30 tablet 2   No current facility-administered medications for this visit.     Allergies:   Patient has no known allergies.   Social History:  The patient  reports that he quit smoking about 41 years ago. His smoking use included cigarettes. He has a 4.50 pack-year smoking history. He has never used smokeless tobacco. He reports current alcohol use. He reports that he does not use drugs.   Family History:  The patient's family history includes Alcohol abuse in his brother; Autoimmune disease in his daughter; Breast cancer in his sister; Cancer in his father; Cataracts in his father; Heart failure in his mother; Liver disease in his brother.    ROS:  Please see the history of present illness.   Otherwise, review of systems is positive for shortness of breath.   All other systems are reviewed and negative.    PHYSICAL  EXAM: VS:  BP 124/72   Pulse 73   Ht 5' 10"  (1.778 m)   Wt 179 lb (81.2 kg)   BMI 25.68 kg/m  , BMI Body mass index is 25.68 kg/m. GEN: Well nourished, well developed, in no acute distress  HEENT: normal  Neck: no JVD, carotid bruits, or masses Cardiac: iRRR; no murmurs, rubs, or gallops,no edema  Respiratory:  clear to auscultation bilaterally, normal work of breathing GI: soft, nontender, nondistended, + BS MS: no deformity or atrophy  Skin: warm and dry, device pocket is well healed Neuro:  Strength and sensation are intact Psych: euthymic mood, full affect  EKG:  EKG is ordered today. Personal review of the ekg ordered shows atrial flutter, V paced, PVCs  Device interrogation is reviewed today in detail.  See PaceArt for details.   Recent Labs: 11/16/2017: Magnesium 2.0 11/19/2017: TSH 1.381 01/05/2018: B Natriuretic Peptide 516.7 05/19/2018: ALT 27; BUN 22; Creatinine 1.66; Hemoglobin 13.8; Platelet Count 112; Potassium 4.1; Sodium 143    Lipid Panel  No results found for: CHOL, TRIG, HDL, CHOLHDL, VLDL, LDLCALC, LDLDIRECT   Wt Readings from Last 3 Encounters:  05/20/18 179 lb (81.2 kg)  05/14/18 183 lb 9.6 oz (83.3 kg)  05/13/18 190 lb 9.6 oz (86.5 kg)      Other studies Reviewed: Additional studies/ records that were reviewed today include: TTE 04/14/2018 Review of the above records today demonstrates:  - Left ventricle: The cavity size was normal. Wall thickness was   increased in a pattern of severe LVH. Systolic function was   moderately reduced. The estimated ejection fraction was in the   range of 35% to 40%. Diffuse hypokinesis. Doppler parameters are   consistent with a reversible restrictive pattern, indicative of   decreased left ventricular diastolic compliance and/or increased   left atrial pressure (grade 3 diastolic dysfunction). - Ventricular septum: The contour showed diastolic flattening. - Aortic valve: Trileaflet; mildly thickened, mildly  calcified   leaflets. - Mitral valve: There was mild regurgitation. - Left atrium: The atrium was moderately dilated. - Right ventricle: The cavity size was moderately dilated. Wall   thickness was normal. - Right atrium: The atrium was moderately dilated. - Pulmonary arteries: Systolic pressure was moderately to severely   increased. PA peak pressure: 68 mm Hg (S).   ASSESSMENT AND PLAN:  1.  Chronic systolic heart failure with  ATTR amyloidosis: Currently on milrinone.  Plan per primary cardiology.  No signs of volume overload.  2. Atrial flutter: Could potentially be typical based on EKG today.  He did not maintain sinus rhythm after cardioversion.  He is unable to tolerate antiarrhythmic medications.  We  thus plan for ablation.  Risks and benefits were reviewed and include bleeding, tamponade, stroke.  The patient understands these risks and is agreed to the procedure.    3. Complete heart block: That is post Medtronic dual-chamber pacemaker 12/2017.  Vice functioning appropriately.  No changes.    Current medicines are reviewed at length with the patient today.   The patient does not have concerns regarding his medicines.  The following changes were made today:  none  Labs/ tests ordered today include:  Orders Placed This Encounter  Procedures  . EKG 12-Lead   Case discussed with primary cardiology  Disposition:   FU with   2 months  Signed,  Dillon Leeds, MD  05/20/2018 3:41 PM     [facility] [address] [place] [address] [phone] (office) [phone] (fax)

## 2018-05-21 DIAGNOSIS — K7460 Unspecified cirrhosis of liver: Secondary | ICD-10-CM | POA: Diagnosis not present

## 2018-05-21 DIAGNOSIS — D631 Anemia in chronic kidney disease: Secondary | ICD-10-CM | POA: Diagnosis not present

## 2018-05-21 DIAGNOSIS — I5043 Acute on chronic combined systolic (congestive) and diastolic (congestive) heart failure: Secondary | ICD-10-CM | POA: Diagnosis not present

## 2018-05-21 DIAGNOSIS — C9000 Multiple myeloma not having achieved remission: Secondary | ICD-10-CM | POA: Diagnosis not present

## 2018-05-21 DIAGNOSIS — I130 Hypertensive heart and chronic kidney disease with heart failure and stage 1 through stage 4 chronic kidney disease, or unspecified chronic kidney disease: Secondary | ICD-10-CM | POA: Diagnosis not present

## 2018-05-21 DIAGNOSIS — Z5181 Encounter for therapeutic drug level monitoring: Secondary | ICD-10-CM | POA: Diagnosis not present

## 2018-05-21 DIAGNOSIS — I4891 Unspecified atrial fibrillation: Secondary | ICD-10-CM | POA: Diagnosis not present

## 2018-05-21 DIAGNOSIS — E119 Type 2 diabetes mellitus without complications: Secondary | ICD-10-CM | POA: Diagnosis not present

## 2018-05-21 DIAGNOSIS — I429 Cardiomyopathy, unspecified: Secondary | ICD-10-CM | POA: Diagnosis not present

## 2018-05-21 DIAGNOSIS — N183 Chronic kidney disease, stage 3 (moderate): Secondary | ICD-10-CM | POA: Diagnosis not present

## 2018-05-21 DIAGNOSIS — E1122 Type 2 diabetes mellitus with diabetic chronic kidney disease: Secondary | ICD-10-CM | POA: Diagnosis not present

## 2018-05-21 LAB — CUP PACEART INCLINIC DEVICE CHECK
Date Time Interrogation Session: 20200206104214
Implantable Lead Implant Date: 20190926
Implantable Lead Implant Date: 20190926
Implantable Lead Location: 753859
Implantable Lead Location: 753860
Implantable Lead Model: 3830
Implantable Lead Model: 5076
Implantable Pulse Generator Implant Date: 20190926

## 2018-05-22 ENCOUNTER — Other Ambulatory Visit: Payer: Self-pay

## 2018-05-22 ENCOUNTER — Ambulatory Visit (HOSPITAL_COMMUNITY): Payer: Medicare HMO | Admitting: Certified Registered Nurse Anesthetist

## 2018-05-22 ENCOUNTER — Ambulatory Visit (HOSPITAL_COMMUNITY)
Admission: RE | Admit: 2018-05-22 | Discharge: 2018-05-23 | Disposition: A | Discharge status: Home / Self Care | Payer: Medicare HMO | Attending: Cardiology | Admitting: Cardiology

## 2018-05-22 ENCOUNTER — Encounter (HOSPITAL_COMMUNITY): Payer: Self-pay | Admitting: General Practice

## 2018-05-22 ENCOUNTER — Ambulatory Visit (HOSPITAL_COMMUNITY)
Admission: RE | Disposition: A | Discharge status: Home / Self Care | Payer: Self-pay | Source: Home / Self Care | Attending: Cardiology

## 2018-05-22 DIAGNOSIS — E1122 Type 2 diabetes mellitus with diabetic chronic kidney disease: Secondary | ICD-10-CM | POA: Insufficient documentation

## 2018-05-22 DIAGNOSIS — Z7901 Long term (current) use of anticoagulants: Secondary | ICD-10-CM | POA: Diagnosis not present

## 2018-05-22 DIAGNOSIS — K2270 Barrett's esophagus without dysplasia: Secondary | ICD-10-CM | POA: Diagnosis not present

## 2018-05-22 DIAGNOSIS — I4892 Unspecified atrial flutter: Secondary | ICD-10-CM | POA: Diagnosis not present

## 2018-05-22 DIAGNOSIS — E785 Hyperlipidemia, unspecified: Secondary | ICD-10-CM | POA: Diagnosis not present

## 2018-05-22 DIAGNOSIS — I442 Atrioventricular block, complete: Secondary | ICD-10-CM | POA: Insufficient documentation

## 2018-05-22 DIAGNOSIS — Z8249 Family history of ischemic heart disease and other diseases of the circulatory system: Secondary | ICD-10-CM | POA: Diagnosis not present

## 2018-05-22 DIAGNOSIS — Z79899 Other long term (current) drug therapy: Secondary | ICD-10-CM | POA: Diagnosis not present

## 2018-05-22 DIAGNOSIS — I130 Hypertensive heart and chronic kidney disease with heart failure and stage 1 through stage 4 chronic kidney disease, or unspecified chronic kidney disease: Secondary | ICD-10-CM | POA: Diagnosis not present

## 2018-05-22 DIAGNOSIS — K760 Fatty (change of) liver, not elsewhere classified: Secondary | ICD-10-CM | POA: Diagnosis not present

## 2018-05-22 DIAGNOSIS — Z950 Presence of cardiac pacemaker: Secondary | ICD-10-CM | POA: Diagnosis not present

## 2018-05-22 DIAGNOSIS — I5042 Chronic combined systolic (congestive) and diastolic (congestive) heart failure: Secondary | ICD-10-CM | POA: Insufficient documentation

## 2018-05-22 DIAGNOSIS — Z87891 Personal history of nicotine dependence: Secondary | ICD-10-CM | POA: Diagnosis not present

## 2018-05-22 DIAGNOSIS — I4891 Unspecified atrial fibrillation: Secondary | ICD-10-CM | POA: Diagnosis not present

## 2018-05-22 DIAGNOSIS — N183 Chronic kidney disease, stage 3 (moderate): Secondary | ICD-10-CM | POA: Diagnosis not present

## 2018-05-22 DIAGNOSIS — K219 Gastro-esophageal reflux disease without esophagitis: Secondary | ICD-10-CM | POA: Insufficient documentation

## 2018-05-22 DIAGNOSIS — I428 Other cardiomyopathies: Secondary | ICD-10-CM | POA: Diagnosis not present

## 2018-05-22 HISTORY — PX: A-FLUTTER ABLATION: EP1230

## 2018-05-22 HISTORY — DX: Presence of cardiac pacemaker: Z95.0

## 2018-05-22 LAB — PROTEIN ELECTROPHORESIS, SERUM, WITH REFLEX
A/G Ratio: 1 (ref 0.7–1.7)
ALPHA-1-GLOBULIN: 0.3 g/dL (ref 0.0–0.4)
Albumin ELP: 3.3 g/dL (ref 2.9–4.4)
Alpha-2-Globulin: 0.7 g/dL (ref 0.4–1.0)
Beta Globulin: 0.8 g/dL (ref 0.7–1.3)
Gamma Globulin: 1.4 g/dL (ref 0.4–1.8)
Globulin, Total: 3.2 g/dL (ref 2.2–3.9)
M-SPIKE, %: 0.5 g/dL — AB
SPEP Interpretation: 0
Total Protein ELP: 6.5 g/dL (ref 6.0–8.5)

## 2018-05-22 LAB — IMMUNOFIXATION REFLEX, SERUM
IgA: 268 mg/dL (ref 61–437)
IgG (Immunoglobin G), Serum: 1078 mg/dL (ref 700–1600)
IgM (Immunoglobulin M), Srm: 581 mg/dL — ABNORMAL HIGH (ref 15–143)

## 2018-05-22 LAB — GLUCOSE, CAPILLARY
Glucose-Capillary: 99 mg/dL (ref 70–99)
Glucose-Capillary: 97 mg/dL (ref 70–99)
Glucose-Capillary: 99 mg/dL (ref 70–99)

## 2018-05-22 SURGERY — A-FLUTTER ABLATION
Anesthesia: General

## 2018-05-22 MED ORDER — HEPARIN (PORCINE) IN NACL 1000-0.9 UT/500ML-% IV SOLN
INTRAVENOUS | Status: AC
  Filled 2018-05-22: qty 500

## 2018-05-22 MED ORDER — HEPARIN (PORCINE) IN NACL 1000-0.9 UT/500ML-% IV SOLN
INTRAVENOUS | Status: DC | PRN
  Administered 2018-05-22 (×2): 500 mL

## 2018-05-22 MED ORDER — LIDOCAINE 2% (20 MG/ML) 5 ML SYRINGE
INTRAMUSCULAR | Status: DC | PRN
  Administered 2018-05-22: 100 mg via INTRAVENOUS

## 2018-05-22 MED ORDER — TRAZODONE HCL 50 MG PO TABS
50.0000 mg | ORAL_TABLET | Freq: Every day | ORAL | Status: DC
  Administered 2018-05-22: 50 mg via ORAL
  Filled 2018-05-22: qty 1

## 2018-05-22 MED ORDER — ONDANSETRON HCL 4 MG/2ML IJ SOLN
INTRAMUSCULAR | Status: DC | PRN
  Administered 2018-05-22: 4 mg via INTRAVENOUS

## 2018-05-22 MED ORDER — GABAPENTIN 400 MG PO CAPS
400.0000 mg | ORAL_CAPSULE | Freq: Every day | ORAL | Status: DC
  Administered 2018-05-22: 400 mg via ORAL
  Filled 2018-05-22: qty 1

## 2018-05-22 MED ORDER — CEFAZOLIN SODIUM-DEXTROSE 2-4 GM/100ML-% IV SOLN
INTRAVENOUS | Status: AC
  Filled 2018-05-22: qty 100

## 2018-05-22 MED ORDER — SERTRALINE HCL 50 MG PO TABS
25.0000 mg | ORAL_TABLET | Freq: Every day | ORAL | Status: DC
  Administered 2018-05-22 – 2018-05-23 (×2): 25 mg via ORAL
  Filled 2018-05-22 (×2): qty 1

## 2018-05-22 MED ORDER — MAGNESIUM OXIDE 400 (241.3 MG) MG PO TABS
400.0000 mg | ORAL_TABLET | Freq: Every day | ORAL | Status: DC
  Administered 2018-05-22: 400 mg via ORAL
  Filled 2018-05-22: qty 2
  Filled 2018-05-22: qty 1

## 2018-05-22 MED ORDER — ROSUVASTATIN CALCIUM 5 MG PO TABS
10.0000 mg | ORAL_TABLET | Freq: Every day | ORAL | Status: DC
  Administered 2018-05-22 – 2018-05-23 (×2): 10 mg via ORAL
  Filled 2018-05-22 (×2): qty 2

## 2018-05-22 MED ORDER — INSULIN ASPART 100 UNIT/ML ~~LOC~~ SOLN: 0.0000 [IU] | Freq: Three times a day (TID) | SUBCUTANEOUS | Status: DC

## 2018-05-22 MED ORDER — SODIUM CHLORIDE 0.9% FLUSH: 10.0000 mL | INTRAVENOUS | Status: DC | PRN

## 2018-05-22 MED ORDER — POLYETHYLENE GLYCOL 3350 17 G PO PACK: 17.0000 g | PACK | Freq: Every day | ORAL | Status: DC | PRN

## 2018-05-22 MED ORDER — SODIUM CHLORIDE 0.9% FLUSH: 10.0000 mL | Freq: Two times a day (BID) | INTRAVENOUS | Status: DC

## 2018-05-22 MED ORDER — SODIUM CHLORIDE 0.9% FLUSH: 3.0000 mL | INTRAVENOUS | Status: DC | PRN

## 2018-05-22 MED ORDER — SODIUM CHLORIDE 0.9% FLUSH
3.0000 mL | Freq: Two times a day (BID) | INTRAVENOUS | Status: DC
  Administered 2018-05-22: 3 mL via INTRAVENOUS

## 2018-05-22 MED ORDER — BUPIVACAINE HCL (PF) 0.25 % IJ SOLN
INTRAMUSCULAR | Status: DC | PRN
  Administered 2018-05-22: 30 mL

## 2018-05-22 MED ORDER — SODIUM CHLORIDE 0.9 % IV SOLN
INTRAVENOUS | Status: DC | PRN
  Administered 2018-05-22: 20 ug/min via INTRAVENOUS

## 2018-05-22 MED ORDER — POTASSIUM CHLORIDE CRYS ER 20 MEQ PO TBCR
20.0000 meq | EXTENDED_RELEASE_TABLET | Freq: Every day | ORAL | Status: DC
  Administered 2018-05-23: 20 meq via ORAL
  Filled 2018-05-22 (×2): qty 1

## 2018-05-22 MED ORDER — ONDANSETRON HCL 4 MG/2ML IJ SOLN: 4.0000 mg | Freq: Four times a day (QID) | INTRAMUSCULAR | Status: DC | PRN

## 2018-05-22 MED ORDER — SODIUM CHLORIDE 0.9 % IV SOLN
INTRAVENOUS | Status: DC
  Administered 2018-05-22 (×2): via INTRAVENOUS

## 2018-05-22 MED ORDER — HYDROCORTISONE ACETATE 25 MG RE SUPP: 25.0000 mg | Freq: Two times a day (BID) | RECTAL | Status: DC | PRN

## 2018-05-22 MED ORDER — MELATONIN 3 MG PO TABS
3.0000 mg | ORAL_TABLET | Freq: Every day | ORAL | Status: DC
  Administered 2018-05-22: 3 mg via ORAL
  Filled 2018-05-22 (×2): qty 1

## 2018-05-22 MED ORDER — APIXABAN 2.5 MG PO TABS
2.5000 mg | ORAL_TABLET | Freq: Two times a day (BID) | ORAL | Status: DC
  Administered 2018-05-22 – 2018-05-23 (×2): 2.5 mg via ORAL
  Filled 2018-05-22 (×2): qty 1

## 2018-05-22 MED ORDER — ONDANSETRON HCL 4 MG PO TABS
4.0000 mg | ORAL_TABLET | Freq: Two times a day (BID) | ORAL | Status: DC
  Administered 2018-05-22 – 2018-05-23 (×2): 4 mg via ORAL
  Filled 2018-05-22 (×2): qty 1

## 2018-05-22 MED ORDER — MILRINONE LACTATE IN DEXTROSE 20-5 MG/100ML-% IV SOLN
0.2500 ug/kg/min | INTRAVENOUS | Status: DC
  Administered 2018-05-22 – 2018-05-23 (×2): 0.25 ug/kg/min via INTRAVENOUS
  Filled 2018-05-22 (×2): qty 100

## 2018-05-22 MED ORDER — CEFAZOLIN SODIUM-DEXTROSE 2-3 GM-%(50ML) IV SOLR
INTRAVENOUS | Status: DC | PRN
  Administered 2018-05-22: 2 g via INTRAVENOUS

## 2018-05-22 MED ORDER — PROPOFOL 10 MG/ML IV BOLUS
INTRAVENOUS | Status: DC | PRN
  Administered 2018-05-22: 50 mg via INTRAVENOUS
  Administered 2018-05-22: 80 mg via INTRAVENOUS

## 2018-05-22 MED ORDER — TORSEMIDE 20 MG PO TABS
20.0000 mg | ORAL_TABLET | Freq: Every day | ORAL | Status: DC
  Administered 2018-05-23: 20 mg via ORAL
  Filled 2018-05-22 (×2): qty 1

## 2018-05-22 MED ORDER — SUGAMMADEX SODIUM 200 MG/2ML IV SOLN
INTRAVENOUS | Status: DC | PRN
  Administered 2018-05-22: 200 mg via INTRAVENOUS

## 2018-05-22 MED ORDER — ZOLPIDEM TARTRATE 5 MG PO TABS
5.0000 mg | ORAL_TABLET | Freq: Every day | ORAL | Status: DC
  Administered 2018-05-22: 5 mg via ORAL
  Filled 2018-05-22: qty 1

## 2018-05-22 MED ORDER — PHENYLEPHRINE 40 MCG/ML (10ML) SYRINGE FOR IV PUSH (FOR BLOOD PRESSURE SUPPORT)
PREFILLED_SYRINGE | INTRAVENOUS | Status: DC | PRN
  Administered 2018-05-22: 40 ug via INTRAVENOUS
  Administered 2018-05-22: 80 ug via INTRAVENOUS

## 2018-05-22 MED ORDER — BUPIVACAINE HCL (PF) 0.25 % IJ SOLN
INTRAMUSCULAR | Status: AC
  Filled 2018-05-22: qty 30

## 2018-05-22 MED ORDER — MILRINONE LACTATE IN DEXTROSE 20-5 MG/100ML-% IV SOLN: 0.1250 ug/kg/min | INTRAVENOUS | Status: DC

## 2018-05-22 MED ORDER — ADULT MULTIVITAMIN W/MINERALS CH
1.0000 | ORAL_TABLET | Freq: Every day | ORAL | Status: DC
  Administered 2018-05-22 – 2018-05-23 (×2): 1 via ORAL
  Filled 2018-05-22 (×2): qty 1

## 2018-05-22 MED ORDER — PANTOPRAZOLE SODIUM 40 MG PO TBEC
40.0000 mg | DELAYED_RELEASE_TABLET | Freq: Every day | ORAL | Status: DC
  Administered 2018-05-22 – 2018-05-23 (×2): 40 mg via ORAL
  Filled 2018-05-22 (×2): qty 1

## 2018-05-22 MED ORDER — ROCURONIUM BROMIDE 10 MG/ML (PF) SYRINGE
PREFILLED_SYRINGE | INTRAVENOUS | Status: DC | PRN
  Administered 2018-05-22: 10 mg via INTRAVENOUS
  Administered 2018-05-22: 50 mg via INTRAVENOUS

## 2018-05-22 MED ORDER — ACETAMINOPHEN 325 MG PO TABS: 650.0000 mg | ORAL_TABLET | ORAL | Status: DC | PRN

## 2018-05-22 MED ORDER — DRONABINOL 2.5 MG PO CAPS
5.0000 mg | ORAL_CAPSULE | Freq: Two times a day (BID) | ORAL | Status: DC
  Administered 2018-05-22 – 2018-05-23 (×2): 5 mg via ORAL
  Filled 2018-05-22 (×2): qty 2

## 2018-05-22 MED ORDER — VITAMIN B-12 1000 MCG PO TABS
500.0000 ug | ORAL_TABLET | ORAL | Status: DC
  Administered 2018-05-22: 500 ug via ORAL
  Filled 2018-05-22: qty 1

## 2018-05-22 MED ORDER — TAFAMIDIS MEGLUMINE (CARDIAC) 20 MG PO CAPS
80.0000 mg | ORAL_CAPSULE | Freq: Every evening | ORAL | Status: DC
  Administered 2018-05-23: 80 mg via ORAL
  Filled 2018-05-22: qty 4

## 2018-05-22 MED ORDER — SODIUM CHLORIDE 0.9 % IV SOLN: 250.0000 mL | INTRAVENOUS | Status: DC | PRN

## 2018-05-22 MED ORDER — FENTANYL CITRATE (PF) 100 MCG/2ML IJ SOLN
INTRAMUSCULAR | Status: DC | PRN
  Administered 2018-05-22 (×4): 25 ug via INTRAVENOUS

## 2018-05-22 SURGICAL SUPPLY — 12 items
BLANKET WARM UNDERBOD FULL ACC (MISCELLANEOUS) ×2 IMPLANT
CATH DUODECA HALO/ISMUS 7FR (CATHETERS) ×2 IMPLANT
CATH EZ STEER NAV 8MM F-J CUR (ABLATOR) ×2 IMPLANT
CATH WEBSTER BI DIR CS D-F CRV (CATHETERS) ×2 IMPLANT
PACK EP LATEX FREE (CUSTOM PROCEDURE TRAY) ×3
PACK EP LF (CUSTOM PROCEDURE TRAY) ×1 IMPLANT
PAD PRO RADIOLUCENT 2001M-C (PAD) ×3 IMPLANT
PATCH CARTO3 (PAD) ×2 IMPLANT
SHEATH PINNACLE 7F 10CM (SHEATH) ×2 IMPLANT
SHEATH PINNACLE 8F 10CM (SHEATH) ×2 IMPLANT
SHEATH PINNACLE 9F 10CM (SHEATH) ×2 IMPLANT
SHEATH SWARTZ RAMP 8.5F 60CM (SHEATH) ×2 IMPLANT

## 2018-05-22 NOTE — Progress Notes (Signed)
Site area: left radial arterial line pulled and pressure held Site Prior to Removal:  Level 0 Pressure Applied For: 5 minutes Manual:   yes Patient Status During Pull:  stable Post Pull Site:  Level  0 Post Pull Instructions Given:  yes Post Pull Pulses Present: left radial pulse palpable Dressing Applied:  Gauze and tegaderm Bedrest begins @  Comments:

## 2018-05-22 NOTE — Progress Notes (Signed)
Site area: 2 rt fv sheaths Site Prior to Removal:  Level 0 Pressure Applied For: 15 minutes Manual:   yes Patient Status During Pull:  stable Post Pull Site:  Level  0 Post Pull Instructions Given:  yes Post Pull Pulses Present: rt dp palpable Dressing Applied:  Gauze and tegaderm Bedrest begins @ 1610 Comments:  IV saline locked

## 2018-05-22 NOTE — Discharge Instructions (Signed)
Post procedure care instructions No driving for 4 days. No lifting over 5 lbs for 1 week. No vigorous or sexual activity for 1 week. You may return to work on 05/29/2018. Keep procedure site clean & dry. If you notice increased pain, swelling, bleeding or pus, call/return!  You may shower, but no soaking baths/hot tubs/pools for 1 week.

## 2018-05-22 NOTE — Anesthesia Procedure Notes (Signed)
Procedure Name: Intubation Performed by: Milford Cage, CRNA Pre-anesthesia Checklist: Patient identified, Emergency Drugs available, Suction available and Patient being monitored Patient Re-evaluated:Patient Re-evaluated prior to induction Oxygen Delivery Method: Circle System Utilized Preoxygenation: Pre-oxygenation with 100% oxygen Induction Type: IV induction Ventilation: Mask ventilation without difficulty and Oral airway inserted - appropriate to patient size Laryngoscope Size: Mac and 4 Grade View: Grade II Tube type: Oral Tube size: 7.5 mm Number of attempts: 1 Airway Equipment and Method: Oral airway and Bougie stylet Placement Confirmation: ETT inserted through vocal cords under direct vision,  positive ETCO2 and breath sounds checked- equal and bilateral Secured at: 24 cm Tube secured with: Tape Dental Injury: Teeth and Oropharynx as per pre-operative assessment

## 2018-05-22 NOTE — Transfer of Care (Signed)
Immediate Anesthesia Transfer of Care Note  Patient: SKYLUR FUSTON  Procedure(s) Performed: A-FLUTTER ABLATION (N/A )  Patient Location: Cath Lab  Anesthesia Type:General  Level of Consciousness: awake, alert  and oriented  Airway & Oxygen Therapy: Patient Spontanous Breathing and Patient connected to nasal cannula oxygen  Post-op Assessment: Report given to RN and Post -op Vital signs reviewed and stable  Post vital signs: Reviewed and stable  Last Vitals:  Vitals Value Taken Time  BP 104/62 05/22/2018  3:33 PM  Temp 36.5 C 05/22/2018  3:33 PM  Pulse 60 05/22/2018  3:35 PM  Resp 16 05/22/2018  3:35 PM  SpO2 92 % 05/22/2018  3:35 PM  Vitals shown include unvalidated device data.  Last Pain:  Vitals:   05/22/18 1533  TempSrc: Temporal  PainSc: 0-No pain      Patients Stated Pain Goal: 5 (67/42/55 2589)  Complications: No apparent anesthesia complications

## 2018-05-22 NOTE — H&P (Signed)
Dillon Andrews has presented today for surgery, with the diagnosis of atrial flutter.  The various methods of treatment have been discussed with the patient and family. After consideration of risks, benefits and other options for treatment, the patient has consented to  Procedure(s): Catheter ablation as a surgical intervention .  Risks include but not limited to bleeding, tamponade, heart block, stroke, damage to surrounding organs, among others. The patient's history has been reviewed, patient examined, no change in status, stable for surgery.  I have reviewed the patient's chart and labs.  Questions were answered to the patient's satisfaction.    Yanet Balliet Curt Bears, MD 05/22/2018 12:16 PM

## 2018-05-22 NOTE — Anesthesia Preprocedure Evaluation (Signed)
Anesthesia Evaluation  Patient identified by MRN, date of birth, ID band Patient awake    Reviewed: Allergy & Precautions, NPO status , Patient's Chart, lab work & pertinent test results  Airway Mallampati: II  TM Distance: >3 FB Neck ROM: Full    Dental no notable dental hx.    Pulmonary neg pulmonary ROS, former smoker,    Pulmonary exam normal breath sounds clear to auscultation       Cardiovascular hypertension, +CHF  + dysrhythmias Atrial Fibrillation + pacemaker + Valvular Problems/Murmurs MR  Rhythm:Irregular Rate:Normal + Systolic murmurs Echo 09/6597: Severe LVH.  EF 25 to 30% with mild HK of the baseline after septal wall.  Moderate to severely dilated right atrium, moderately dilated RV   Neuro/Psych negative neurological ROS  negative psych ROS   GI/Hepatic GERD  ,(+) Cirrhosis   Esophageal Varices    ,   Endo/Other  diabetes  Renal/GU Renal InsufficiencyRenal disease  negative genitourinary   Musculoskeletal negative musculoskeletal ROS (+)   Abdominal   Peds negative pediatric ROS (+)  Hematology negative hematology ROS (+)   Anesthesia Other Findings   Reproductive/Obstetrics negative OB ROS                             Anesthesia Physical Anesthesia Plan  ASA: IV  Anesthesia Plan: General   Post-op Pain Management:    Induction: Intravenous  PONV Risk Score and Plan: Ondansetron, Dexamethasone and Treatment may vary due to age or medical condition  Airway Management Planned: Oral ETT  Additional Equipment:   Intra-op Plan:   Post-operative Plan: Extubation in OR  Informed Consent: I have reviewed the patients History and Physical, chart, labs and discussed the procedure including the risks, benefits and alternatives for the proposed anesthesia with the patient or authorized representative who has indicated his/her understanding and acceptance.     Dental  advisory given  Plan Discussed with: CRNA and Surgeon  Anesthesia Plan Comments:         Anesthesia Quick Evaluation

## 2018-05-22 NOTE — Plan of Care (Signed)

## 2018-05-22 NOTE — Progress Notes (Signed)
[place], RN bedside nurse to contact MD regarding interrupting milrinone that is infusing via SL tunneled CL.  Dallas also reports that patient is still on home pump.  Advised that MD will need to dose patient's milrinone and switch over to hospital pump.  Carolee Rota, RN VAST

## 2018-05-22 NOTE — Anesthesia Procedure Notes (Signed)
Arterial Line Insertion Start/End2/10/2018 12:58 PM, 05/22/2018 1:05 PM Performed by: Myrtie Soman, MD, Milford Cage, CRNA, CRNA  Patient location: OOR procedure area. Preanesthetic checklist: patient identified, IV checked, site marked, risks and benefits discussed, surgical consent, monitors and equipment checked, pre-op evaluation, timeout performed and anesthesia consent Lidocaine 1% used for infiltration and patient sedated Left, radial was placed Catheter size: 20 G Hand hygiene performed , maximum sterile barriers used  and Seldinger technique used  Attempts: 1 Procedure performed without using ultrasound guided technique. Following insertion, dressing applied. Post procedure assessment: normal and unchanged  Patient tolerated the procedure well with no immediate complications.

## 2018-05-23 ENCOUNTER — Other Ambulatory Visit: Payer: Self-pay

## 2018-05-23 DIAGNOSIS — I130 Hypertensive heart and chronic kidney disease with heart failure and stage 1 through stage 4 chronic kidney disease, or unspecified chronic kidney disease: Secondary | ICD-10-CM | POA: Diagnosis not present

## 2018-05-23 DIAGNOSIS — I5042 Chronic combined systolic (congestive) and diastolic (congestive) heart failure: Secondary | ICD-10-CM | POA: Diagnosis not present

## 2018-05-23 DIAGNOSIS — I484 Atypical atrial flutter: Secondary | ICD-10-CM

## 2018-05-23 DIAGNOSIS — E785 Hyperlipidemia, unspecified: Secondary | ICD-10-CM | POA: Diagnosis not present

## 2018-05-23 DIAGNOSIS — I4892 Unspecified atrial flutter: Secondary | ICD-10-CM | POA: Diagnosis not present

## 2018-05-23 DIAGNOSIS — N183 Chronic kidney disease, stage 3 (moderate): Secondary | ICD-10-CM | POA: Diagnosis not present

## 2018-05-23 DIAGNOSIS — K219 Gastro-esophageal reflux disease without esophagitis: Secondary | ICD-10-CM | POA: Diagnosis not present

## 2018-05-23 DIAGNOSIS — E1122 Type 2 diabetes mellitus with diabetic chronic kidney disease: Secondary | ICD-10-CM | POA: Diagnosis not present

## 2018-05-23 DIAGNOSIS — K2270 Barrett's esophagus without dysplasia: Secondary | ICD-10-CM | POA: Diagnosis not present

## 2018-05-23 DIAGNOSIS — Z87891 Personal history of nicotine dependence: Secondary | ICD-10-CM | POA: Diagnosis not present

## 2018-05-23 LAB — BASIC METABOLIC PANEL
Anion gap: 9 (ref 5–15)
BUN: 12 mg/dL (ref 8–23)
CO2: 31 mmol/L (ref 22–32)
CREATININE: 1.43 mg/dL — AB (ref 0.61–1.24)
Calcium: 8.2 mg/dL — ABNORMAL LOW (ref 8.9–10.3)
Chloride: 104 mmol/L (ref 98–111)
GFR calc Af Amer: 54 mL/min — ABNORMAL LOW (ref >60)
GFR calc non Af Amer: 47 mL/min — ABNORMAL LOW (ref >60)
GLUCOSE: 123 mg/dL — AB (ref 70–99)
Potassium: 3 mmol/L — ABNORMAL LOW (ref 3.5–5.1)
Sodium: 144 mmol/L (ref 135–145)

## 2018-05-23 LAB — GLUCOSE, CAPILLARY
Glucose-Capillary: 106 mg/dL — ABNORMAL HIGH (ref 70–99)
Glucose-Capillary: 117 mg/dL — ABNORMAL HIGH (ref 70–99)
Glucose-Capillary: 107 mg/dL — ABNORMAL HIGH (ref 70–99)

## 2018-05-23 NOTE — Progress Notes (Signed)
Progress Note  Patient Name: Dillon Andrews Date of Encounter: 05/23/2018  Primary Cardiologist: Glenetta Hew, MD   Subjective   No chest pain or sob.   Inpatient Medications    Scheduled Meds: . apixaban  2.5 mg Oral BID  . dronabinol  5 mg Oral BID  . gabapentin  400 mg Oral QHS  . insulin aspart  0-15 Units Subcutaneous TID WC  . magnesium oxide  400 mg Oral QHS  . Melatonin  3 mg Oral QHS  . multivitamin with minerals  1 tablet Oral Daily  . ondansetron  4 mg Oral BID  . pantoprazole  40 mg Oral Daily  . potassium chloride SA  20 mEq Oral Daily  . rosuvastatin  10 mg Oral Daily  . sertraline  25 mg Oral Daily  . sodium chloride flush  10-40 mL Intracatheter Q12H  . sodium chloride flush  3 mL Intravenous Q12H  . Tafamidis Meglumine (Cardiac)  80 mg Oral QPM  . torsemide  20 mg Oral Daily  . traZODone  50 mg Oral QHS  . vitamin B-12  500 mcg Oral QODAY  . zolpidem  5 mg Oral QHS   Continuous Infusions: . sodium chloride    . milrinone 0.25 mcg/kg/min (05/23/18 0741)   PRN Meds: sodium chloride, acetaminophen, ondansetron (ZOFRAN) IV, polyethylene glycol, sodium chloride flush, sodium chloride flush   Vital Signs    Vitals:   05/22/18 2117 05/23/18 0018 05/23/18 0517 05/23/18 0748  BP: 111/69 104/84 117/78 122/82  Pulse: 75 91 96 96  Resp: 18 16 18 20   Temp: 99 F (37.2 C) 99.2 F (37.3 C) 99 F (37.2 C) 98 F (36.7 C)  TempSrc: Oral Oral  Oral  SpO2: 93% 91% 93% 94%  Weight:   80.8 kg   Height:        Intake/Output Summary (Last 24 hours) at 05/23/2018 0844 Last data filed at 05/23/2018 0715 Gross per 24 hour  Intake 703 ml  Output 800 ml  Net -97 ml   Filed Weights   05/22/18 1055 05/23/18 0517  Weight: 80.7 kg 80.8 kg    Telemetry    nsr with atrial and ventricular pacing - Personally Reviewed  ECG    nsr with atrial pacing and ventricular pacing - Personally Reviewed  Physical Exam   GEN: No acute distress.   Neck: No  JVD Cardiac: RRR, no murmurs, rubs, or gallops.  Respiratory: Clear to auscultation bilaterally. GI: Soft, nontender, non-distended  MS: No edema; No deformity. Neuro:  Nonfocal  Psych: Normal affect   Labs    Chemistry Recent Labs  Lab 05/19/18 1021 05/23/18 0557  NA 143 144  K 4.1 3.0*  CL 100 104  CO2 37* 31  GLUCOSE 126* 123*  BUN 22 12  CREATININE 1.66* 1.43*  CALCIUM 9.0 8.2*  PROT 6.5  --   ALBUMIN 3.8  --   AST 51*  --   ALT 27  --   ALKPHOS 182*  --   BILITOT 1.4*  --   GFRNONAA 39* 47*  GFRAA 45* 54*  ANIONGAP 6 9     Hematology Recent Labs  Lab 05/19/18 1021  WBC 4.7  RBC 4.35  HGB 13.8  HCT 43.4  MCV 99.8  MCH 31.7  MCHC 31.8  RDW 19.1*  PLT 112*    Cardiac EnzymesNo results for input(s): TROPONINI in the last 168 hours. No results for input(s): TROPIPOC in the last 168 hours.  BNPNo results for input(s): BNP, PROBNP in the last 168 hours.   DDimer No results for input(s): DDIMER in the last 168 hours.   Radiology    No results found.  Cardiac Studies   none  Patient Profile     78 y.o. male admitted for EPS/RFA of atrial flutter, s/p DCCV and atrial flutter isthmus ablation.  Assessment & Plan    1. Atrial flutter - s/p ablation and DCCV maintaining NSR. He will continue his current meds. He is stable for DC home. Followup Dr. Carlyn Reichert in 3-4 weeks.  Gregg Taylor,M.D.  For questions or updates, please contact [place] Please consult [email or web address] for contact info under Cardiology/STEMI.      Signed, Cristopher Peru, MD  05/23/2018, 8:44 AM  Patient ID: Dillon Andrews, male   DOB: [birth year], 78 y.o.   MRN: [id number]

## 2018-05-23 NOTE — Progress Notes (Signed)
Patient is waiting on Home health nurse to set up home infusion, Discharge paper received, PIV removed, Tele removed and CCMD notified. Daughter at Bedside to transport patient when ready

## 2018-05-23 NOTE — Discharge Summary (Addendum)
Discharge Summary    Patient ID: Dillon Andrews MRN: [id number]; DOB: [birth year]  Admit date: 05/22/2018 Discharge date: 05/23/2018  Primary Care Provider: [name], MD  Primary Cardiologist: Glenetta Hew, MD Primary Electrophysiologist:  Will Meredith Leeds, MD  CHF: Dr. [name]  Discharge Diagnoses    Active Problems:   Atrial flutter ([place])  chronic combined CHF   CHB s/p pacemaker   HTn  HLD  Allergies No Known Allergies  Diagnostic Studies/Procedures    PREPROCEDURE DIAGNOSIS:  Atrial flutter.      POSTPROCEDURE DIAGNOSIS:  Atrial flutter     PROCEDURES:   1. Comprehensive EP study.   2. Coronary sinus pacing and recording.   3. Mapping of supraventricular tachycardia.   4. Radiofrequency ablation of supraventricular tachycardia.      INTRODUCTION: Dillon Andrews is a 78 y.o. male with a history of typical appearing atrial flutter who presents today for EP study and radiofrequency ablation.  The patient recently developed symptoms of weakness and fatigue for which he  was found to have atrial flutter with elevated ventricular rates.  The patient remains symptomatic despite rate control.  The patient has been adequately anticoagulated for three weeks and now presents for EP study and radiofrequency ablation of atrial   flutter.      DESCRIPTION OF PROCEDURE:  Informed written consent was obtained and the   patient was brought to the Electrophysiology Lab in the fasting state. The patient was very clear that she had been compliant with eliquis over the past 3 weeks without interruption.  The patient was adequately sedated with intravenous medication as outlined in the anesthesia report.  The patient's right groin was prepped and draped in the usual sterile fashion by the EP Lab staff.  Using a percutaneous Seldinger technique, two 6-French and one 8-French hemostasis sheaths were placed   into the right common femoral vein.  A 6-French decapolar Polaris  X catheter was introduced through the right common femoral vein and advanced into the coronary sinus for recording and pacing from this location.  A 6-French quadripolar Josephson catheter was introduced through the right common femoral vein and advanced into the right   ventricle for recording and pacing.  This catheter was then pulled back to the His bundle location.    Presenting Measurements: The patient presented to the Electrophysiology Lab in atrial flutter.  The surface electrocardiogram was consistent with typical atrial flutter.  The atrial flutter cycle length was 266 milliseconds.  The coronary sinus catheter activation revealed proximal to distal activation and was therefore suggestive of right atrial flutter.  The patient's QRS duration was 147 milliseconds with a QT interval of 518 milliseconds.   Entrainment and Mapping: Mapping along the cavotricuspid isthmus revealed very small signals.  Entrainment mapping was attempted, but could not capture along the isthmus.  Halo catheter was put into the left atrium which revealed what appeared initially to be typical atrial flutter.  Entrainment was performed from the left atrium, which revealed a long postpacing interval.  A Biosense Webster FJ 8-mm ablation catheter was introduced through the right common femoral vein and advanced into the right atrium.  Activation points were taken throughout the atrium which revealed earliest activation along the cavotricuspid isthmus, though there was early activation as well around the coronary sinus and posterior right atrium.  It appeared that they were multiple circuits as multiple atrial cycle lengths were recorded.  Ablation: The ablation catheter was therefore positioned along the cavotricuspid isthmus and  a series of radiofrequency applications were delivered with a target temperature of 60 degrees of 50 watts for 120 seconds each.  The tachycardia slowed and then terminated during radiofrequency    application.  Additional mapping of the atrial signal was performed with  additional ablation performed.  A 7-French Biosense Webster dual decapolar halo catheter was introduced through the right common femoral vein and advanced into the right atrium.  This catheter was positioned around the tricuspid valve annulus.  This demonstrated that the patient continued to have conduction through the cavotricuspid isthmus.  An 8-   [place] RAMP sheath was therefore advanced through the right common   femoral vein into the right atrium.  The ablation catheter was positioned through the RAMP sheath along the cavotricuspid isthmus and an additional radiofrequency applications were delivered with a target temperature of 60 degrees at 50 watts.  During ablation, the patient's tachycardia did not change.  He was thus cardioverted.  Pacing around the isthmus line from the coronary sinus and recording from the lateral isthmus revealed bidirectional block with activation across the isthmus line being 160 ms.  The patient was observed for 20 minutes without return of conduction through the cavotricuspid isthmus.    Measurements following ablation: Following ablation, the stimulus to earliest atrial activation recorded across the isthmus line measured 160 milliseconds.  Due to the presence of the patient's dual-chamber pacemaker, no further evaluation was performed.  The patient's QRS was 122 ms, QT 483 ms, RR interval 730 ms.   CONCLUSIONS:   1. Right atrial flutter upon presentation.   2. Successful radiofrequency ablation of atrial flutter along the cavotricuspid isthmus with complete bidirectional isthmus block achieved.   3. No early apparent complications.    History of Present Illness     Dillon Andrews is a 78 y.o. malewith a hx of chronic combined CHF with ATTR amyloidosis, CKD III,presumedNASH(per pt, never proven),esophageal varices per chart(prior glass of wine occasionally but no heavy alcohol),HTN,  HLD, DM, Barrett's esophagus, GERD, anemia, RBBB, CHB s/p medtronic dual chamber pacemaker and  atypical atrial flutter presents for outpatient ablation.    He presented to the hospital September 2019 with CHB and had a Medtronic dual-chamber pacemaker implanted 01/08/2018. He has had multiple cardioversions for atrial flutter and has unfortunately gone back into atrial flutter quite quickly. He is unable to tolerate antiarrhythmic medications. His ejection fraction is fortunately improved to 30 to 35%.     Hospital Course     Consultants: None  Patient underwent successful ablation and cardioversion.  Maintaining sinus rhythm overnight.  No complication.  Continued current home medication.  Felt stable for discharge.  The patient been seen by Dr. Lovena Le today and deemed ready for discharge home. All follow-up appointments have been scheduled. Discharge medications are listed below.   Discharge Vitals Blood pressure 122/82, pulse 96, temperature 98 F (36.7 C), temperature source Oral, resp. rate 20, height 5' 10"  (1.778 m), weight 80.8 kg, SpO2 94 %.  Filed Weights   05/22/18 1055 05/23/18 0517  Weight: 80.7 kg 80.8 kg    Labs & Radiologic Studies   Basic Metabolic Panel Recent Labs    05/23/18 0557  NA 144  K 3.0*  CL 104  CO2 31  GLUCOSE 123*  BUN 12  CREATININE 1.43*  CALCIUM 8.2*    Disposition   Pt is being discharged home today in good condition.  Follow-up Plans & Appointments    Follow-up Information    Curt Bears, Will Hassell Done,  MD Follow up.   Specialty:  Cardiology Why:  06/16/2018 @ 3:45PM Contact information: [address] [place] [address] [phone]        Bensimhon, Shaune Pascal, MD Follow up.   Specialty:  Cardiology Why:  06/18/2018 @ 10:40AM Contact information: [place] [address] [phone]          Discharge Instructions    Diet - low sodium heart healthy   Complete by:  As directed     Increase activity slowly   Complete by:  As directed       Discharge Medications   Allergies as of 05/23/2018   No Known Allergies     Medication List    TAKE these medications   apixaban 2.5 MG Tabs tablet Commonly known as:  ELIQUIS Take 1 tablet (2.5 mg total) by mouth 2 (two) times daily.   dronabinol 5 MG capsule Commonly known as:  MARINOL TAKE 1 CAPSULE(5 MG) BY MOUTH TWICE DAILY BEFORE LUNCH AND SUPPER What changed:  See the new instructions.   gabapentin 800 MG tablet Commonly known as:  NEURONTIN TAKE 1 TABLET BY MOUTH AT BEDTIME What changed:    how much to take  how to take this   glucose blood test strip Use as instructed   hydrocortisone 25 MG suppository Commonly known as:  ANUSOL-HC Place 1 suppository (25 mg total) rectally 2 (two) times daily. What changed:    when to take this  reasons to take this   Magnesium 500 MG Tabs Take 500 mg by mouth at bedtime.   Melatonin 5 MG Tabs Take 5 mg by mouth at bedtime.   milrinone 20 MG/100 ML Soln infusion Commonly known as:  PRIMACOR Inject 0.0099 mg/min into the vein continuous.   multivitamin with minerals Tabs tablet Take 1 tablet by mouth daily.   omeprazole 40 MG capsule Commonly known as:  PRILOSEC TAKE 1 CAPSULE(40 MG) BY MOUTH DAILY What changed:  See the new instructions.   ondansetron 4 MG tablet Commonly known as:  ZOFRAN TAKE 1 TABLET(4 MG) BY MOUTH EVERY 8 HOURS AS NEEDED FOR NAUSEA OR VOMITING What changed:  See the new instructions.   polyethylene glycol packet Commonly known as:  MIRALAX / GLYCOLAX Take 17 g by mouth daily. What changed:    when to take this  reasons to take this   potassium chloride SA 20 MEQ tablet Commonly known as:  K-DUR,KLOR-CON Take 20 mEq by mouth See admin instructions. Take 20 meq every morning may increase to 40 meq once daily as needed for excessive weight gain / swelling (take with 40 meq with 40 mg of torsemide)   rosuvastatin 10 MG  tablet Commonly known as:  CRESTOR TAKE 1 TABLET BY MOUTH ONCE DAILY   sertraline 25 MG tablet Commonly known as:  ZOLOFT TAKE 1 TABLET BY MOUTH EVERY DAY   torsemide 20 MG tablet Commonly known as:  DEMADEX Take 1 tablet (20 mg total) by mouth daily. Can take 20 mg extra as needed. What changed:    how much to take  when to take this  additional instructions   traZODone 50 MG tablet Commonly known as:  DESYREL TAKE 1 TABLET(50 MG) BY MOUTH AT BEDTIME What changed:  See the new instructions.   vitamin B-12 500 MCG tablet Commonly known as:  CYANOCOBALAMIN Take 500 mcg by mouth every other day.   VYNDAQEL 20 MG Caps Generic drug:  Tafamidis Meglumine (  Cardiac) Take 80 mg by mouth daily. (4 capsules) What changed:    when to take this  additional instructions   zolpidem 5 MG tablet Commonly known as:  AMBIEN TAKE 1 TABLET(5 MG) BY MOUTH AT BEDTIME What changed:  See the new instructions.        Acute coronary syndrome (MI, NSTEMI, STEMI, etc) this admission?: No.    Outstanding Labs/Studies   None  Duration of Discharge Encounter   Greater than 30 minutes including physician time.  Jarrett Soho, PA 05/23/2018, 11:31 AM  EP Attending  Patient seen and examined. Agree with above. The patient is stable for DC. See my note as well. followup as above.  Mikle Bosworth.D.

## 2018-05-23 NOTE — Care Management (Addendum)
Spoke w patient and daughter and daughter at bedside. Milrinone for home will be ready Sunday per [facility]. Daughter states he has pump at bedside. He is active for [facility] RN, resumption order placed.  Copied Rx milrinone to [facility] order per request [facility] clinical liaison. Rx verified on both DC summary and AVS.

## 2018-05-23 NOTE — Progress Notes (Signed)
Pt's home Milrinone drip turned off and switched to hospital's IV drip set by IV team RN. MD paged. Cooxemetry lab orders d/c'ed because the patient's port-a-cath has only a single lumen. Dr. [name] rounded on patient after Milrinone drip switched. Will continue to monitor.

## 2018-05-23 NOTE — Progress Notes (Addendum)
Patient daughter called [facility] to see about getting an oncalled pharmacist to compete the home health orders for IV millirone Infusion.

## 2018-05-24 DIAGNOSIS — I130 Hypertensive heart and chronic kidney disease with heart failure and stage 1 through stage 4 chronic kidney disease, or unspecified chronic kidney disease: Secondary | ICD-10-CM | POA: Diagnosis not present

## 2018-05-24 DIAGNOSIS — E1122 Type 2 diabetes mellitus with diabetic chronic kidney disease: Secondary | ICD-10-CM | POA: Diagnosis not present

## 2018-05-24 DIAGNOSIS — C9000 Multiple myeloma not having achieved remission: Secondary | ICD-10-CM | POA: Diagnosis not present

## 2018-05-24 DIAGNOSIS — I5043 Acute on chronic combined systolic (congestive) and diastolic (congestive) heart failure: Secondary | ICD-10-CM | POA: Diagnosis not present

## 2018-05-24 DIAGNOSIS — D631 Anemia in chronic kidney disease: Secondary | ICD-10-CM | POA: Diagnosis not present

## 2018-05-24 DIAGNOSIS — I429 Cardiomyopathy, unspecified: Secondary | ICD-10-CM | POA: Diagnosis not present

## 2018-05-24 DIAGNOSIS — I4891 Unspecified atrial fibrillation: Secondary | ICD-10-CM | POA: Diagnosis not present

## 2018-05-24 DIAGNOSIS — K7460 Unspecified cirrhosis of liver: Secondary | ICD-10-CM | POA: Diagnosis not present

## 2018-05-24 DIAGNOSIS — N183 Chronic kidney disease, stage 3 (moderate): Secondary | ICD-10-CM | POA: Diagnosis not present

## 2018-05-25 ENCOUNTER — Ambulatory Visit (INDEPENDENT_AMBULATORY_CARE_PROVIDER_SITE_OTHER): Payer: Medicare HMO | Admitting: *Deleted

## 2018-05-25 ENCOUNTER — Telehealth: Payer: Self-pay | Admitting: *Deleted

## 2018-05-25 ENCOUNTER — Encounter (HOSPITAL_COMMUNITY): Payer: Self-pay | Admitting: Cardiology

## 2018-05-25 ENCOUNTER — Ambulatory Visit
Admission: RE | Admit: 2018-05-25 | Discharge: 2018-05-25 | Disposition: A | Discharge status: Home / Self Care | Payer: Medicare HMO | Source: Ambulatory Visit | Attending: Cardiology | Admitting: Cardiology

## 2018-05-25 DIAGNOSIS — I442 Atrioventricular block, complete: Secondary | ICD-10-CM

## 2018-05-25 DIAGNOSIS — Z950 Presence of cardiac pacemaker: Secondary | ICD-10-CM

## 2018-05-25 LAB — CUP PACEART INCLINIC DEVICE CHECK
Battery Remaining Longevity: 19 mo
Battery Voltage: 3.01 V
Brady Statistic AP VP Percent: 3.95 %
Brady Statistic AP VS Percent: 0.1 %
Brady Statistic AS VP Percent: 84.61 %
Brady Statistic AS VS Percent: 11.34 %
Brady Statistic RA Percent Paced: 4.22 %
Brady Statistic RV Percent Paced: 86.86 %
Implantable Lead Implant Date: 20190926
Implantable Lead Location: 753859
Implantable Lead Location: 753860
Implantable Lead Model: 3830
Implantable Lead Model: 5076
Implantable Pulse Generator Implant Date: 20190926
Lead Channel Impedance Value: 266 Ohm
Lead Channel Impedance Value: 266 Ohm
Lead Channel Impedance Value: 361 Ohm
Lead Channel Impedance Value: 399 Ohm
Lead Channel Pacing Threshold Amplitude: 0.5 V
Lead Channel Pacing Threshold Amplitude: 4.5 V
Lead Channel Pacing Threshold Amplitude: 4.75 V
Lead Channel Pacing Threshold Pulse Width: 0.4 ms
Lead Channel Pacing Threshold Pulse Width: 1 ms
Lead Channel Pacing Threshold Pulse Width: 1 ms
Lead Channel Sensing Intrinsic Amplitude: 1.875 mV
Lead Channel Sensing Intrinsic Amplitude: 2.375 mV
Lead Channel Setting Pacing Amplitude: 1.5 V
Lead Channel Setting Pacing Amplitude: 8 V
Lead Channel Setting Pacing Pulse Width: 1 ms
Lead Channel Setting Sensing Sensitivity: 0.6 mV
MDC IDC LEAD IMPLANT DT: 20190926
MDC IDC SESS DTM: 20200210170449

## 2018-05-25 MED FILL — Cefazolin Sodium-Dextrose IV Solution 2 GM/100ML-4%: INTRAVENOUS | Qty: 100 | Status: AC

## 2018-05-25 NOTE — Telephone Encounter (Signed)
Patient daughter called back and stated that they can be here in 30 mins. Device Tech RN aware.

## 2018-05-25 NOTE — Patient Instructions (Signed)
Go to [place] at [facility] today for a chest X-ray.  If you experience any severe dizziness or passing out spells prior to your appointment with Dr. Curt Bears on Friday, 05/29/18 at 10:00am, call 911.

## 2018-05-25 NOTE — Progress Notes (Signed)
Pacemaker check in clinic, added-on due to "High" RV threshold alert noted on remote transmission. Alert previously disabled due to elevated RV (HBP) threshold in the immediate post-implant period, but re-enabled post-A-flutter ablation on 05/22/18 per scanned-in device check. Atrial lead trends stable. RV impedance and sensing trends stable, R-waves chronically low. Intermittent RV capture noted on 12-lead EKG below 4.5V @ 1.69m unipolar and 4.75V @ 1.059mbipolar (unable to discern LOC on intra-cardiac EGMs). Patient conducted 1:1 with 1st degree HB (PR ~32080mwhen underlying rhythm checked today, denies recent presyncope or syncope, exertional ShOB ongoing since prior to ablation. Increased RV output to 8.0V @ 1.0ms87mipolar), monitored rhythm via 12-lead EKG throughout visit, no intermittent LOC noted at higher output.  Discussed with AmbeChanetta Marshall. CXR ordered, plan for f/u with WC on 05/29/18. Patient and daughter aware of ED precautions for any presyncope/syncope prior to this appointment.

## 2018-05-25 NOTE — Telephone Encounter (Signed)
Spoke with patient as we received a "high RV threshold" alert from his PPM. Appears alert, which was previously disabled due to known elevated HBP threshold, was re-enabled after ablation on 05/22/18. Explained PPM working appropriately, but would like to see patient in the office to turn alert off. Patient reports wife has an appt this afternoon so he may be able to come after that. Gave direct number, he will call back after discussing with his daughter (who will drive him).

## 2018-05-27 ENCOUNTER — Other Ambulatory Visit (HOSPITAL_COMMUNITY): Payer: Self-pay | Admitting: Internal Medicine

## 2018-05-27 DIAGNOSIS — N183 Chronic kidney disease, stage 3 (moderate): Secondary | ICD-10-CM | POA: Diagnosis not present

## 2018-05-27 DIAGNOSIS — I130 Hypertensive heart and chronic kidney disease with heart failure and stage 1 through stage 4 chronic kidney disease, or unspecified chronic kidney disease: Secondary | ICD-10-CM | POA: Diagnosis not present

## 2018-05-27 DIAGNOSIS — I5043 Acute on chronic combined systolic (congestive) and diastolic (congestive) heart failure: Secondary | ICD-10-CM | POA: Diagnosis not present

## 2018-05-27 DIAGNOSIS — E119 Type 2 diabetes mellitus without complications: Secondary | ICD-10-CM | POA: Diagnosis not present

## 2018-05-28 DIAGNOSIS — I5043 Acute on chronic combined systolic (congestive) and diastolic (congestive) heart failure: Secondary | ICD-10-CM | POA: Diagnosis not present

## 2018-05-28 DIAGNOSIS — D631 Anemia in chronic kidney disease: Secondary | ICD-10-CM | POA: Diagnosis not present

## 2018-05-28 DIAGNOSIS — I4891 Unspecified atrial fibrillation: Secondary | ICD-10-CM | POA: Diagnosis not present

## 2018-05-28 DIAGNOSIS — N183 Chronic kidney disease, stage 3 (moderate): Secondary | ICD-10-CM | POA: Diagnosis not present

## 2018-05-28 DIAGNOSIS — I130 Hypertensive heart and chronic kidney disease with heart failure and stage 1 through stage 4 chronic kidney disease, or unspecified chronic kidney disease: Secondary | ICD-10-CM | POA: Diagnosis not present

## 2018-05-28 DIAGNOSIS — E119 Type 2 diabetes mellitus without complications: Secondary | ICD-10-CM | POA: Diagnosis not present

## 2018-05-28 DIAGNOSIS — K7460 Unspecified cirrhosis of liver: Secondary | ICD-10-CM | POA: Diagnosis not present

## 2018-05-28 DIAGNOSIS — E1122 Type 2 diabetes mellitus with diabetic chronic kidney disease: Secondary | ICD-10-CM | POA: Diagnosis not present

## 2018-05-28 DIAGNOSIS — C9000 Multiple myeloma not having achieved remission: Secondary | ICD-10-CM | POA: Diagnosis not present

## 2018-05-28 DIAGNOSIS — I429 Cardiomyopathy, unspecified: Secondary | ICD-10-CM | POA: Diagnosis not present

## 2018-05-28 DIAGNOSIS — Z5181 Encounter for therapeutic drug level monitoring: Secondary | ICD-10-CM | POA: Diagnosis not present

## 2018-05-29 ENCOUNTER — Ambulatory Visit: Payer: Medicare HMO | Admitting: Cardiology

## 2018-05-29 ENCOUNTER — Encounter: Payer: Self-pay | Admitting: Cardiology

## 2018-05-29 VITALS — BP 106/64 | HR 82 | Ht 70.0 in | Wt 179.4 lb

## 2018-05-29 DIAGNOSIS — Z01812 Encounter for preprocedural laboratory examination: Secondary | ICD-10-CM

## 2018-05-29 DIAGNOSIS — I484 Atypical atrial flutter: Secondary | ICD-10-CM | POA: Diagnosis not present

## 2018-05-29 LAB — BASIC METABOLIC PANEL
BUN/Creatinine Ratio: 12 (ref 10–24)
BUN: 18 mg/dL (ref 8–27)
CO2: 29 mmol/L (ref 20–29)
Calcium: 9.4 mg/dL (ref 8.6–10.2)
Chloride: 99 mmol/L (ref 96–106)
Creatinine, Ser: 1.47 mg/dL — ABNORMAL HIGH (ref 0.76–1.27)
GFR calc Af Amer: 52 mL/min/{1.73_m2} — ABNORMAL LOW (ref >59)
GFR calc non Af Amer: 45 mL/min/{1.73_m2} — ABNORMAL LOW (ref >59)
Glucose: 123 mg/dL — ABNORMAL HIGH (ref 65–99)
Potassium: 4.2 mmol/L (ref 3.5–5.2)
Sodium: 146 mmol/L — ABNORMAL HIGH (ref 134–144)

## 2018-05-29 MED ORDER — APIXABAN 5 MG PO TABS: 5.0000 mg | ORAL_TABLET | Freq: Two times a day (BID) | ORAL | 0 refills | Status: AC

## 2018-05-29 NOTE — Patient Instructions (Addendum)
Medication Instructions:  Your physician has recommended you make the following change in your medication:  1. INCREASE Eliquis to 5 mg twice daily  * If you need a refill on your cardiac medications before your next appointment, please call your pharmacy.   Labwork: BMET today *We will only notify you of abnormal results, otherwise continue current treatment plan.  Testing/Procedures: Your physician has recommended that you have a pacemaker lead revision.  Please see the instruction below under special instructions area.  Follow-Up: Your physician recommends that you schedule a follow-up appointment in: 10-14 days, after your procedure on 05/25/2018, with device clinic for a wound check.  Your physician recommends that you schedule a follow-up appointment in: 91 days, after your procedure on 05/25/2018, with Dr. Curt Bears.   Thank you for choosing [facility]!!   Trinidad Curet, RN [phone]  Any Other Special Instructions Will Be Listed Below (If Applicable).  Device Instructions  You are scheduled for:                  _____ Pacemaker Lead Revision  on  06/03/2018  with Dr. Curt Bears.  1.   Please arrive at the [facility], Entrance "A"  at [facility] at  12:30 p.m.  on the day of your procedure. (The address is [address])  2. Do not eat or drink after midnight the night before your procedure.  3.   Complete pre procedure lab work on 05/29/18.   4.   All of your medications may be taken with a small amount of water the morning of your procedure.  5.  Plan for an overnight stay.  Bring your insurance cards and a list of you medications.  6.  Wash your chest and neck with surgical scrub the evening before and the morning of  your procedure.  Rinse well. Please review the surgical scrub instruction sheet given to you.                                                                                                                * If you have ANY  questions after you get home, please call Trinidad Curet, RN @ [phone].  * Every attempt is made to prevent procedures from being rescheduled.  Due to the nature of  Electrophysiology, rescheduling can happen.  The physician is always aware and directs the staff when this occurs.

## 2018-05-29 NOTE — Addendum Note (Signed)
Addended by: Eulis Foster on: 05/29/2018 11:16 AM   Modules accepted: Orders

## 2018-05-29 NOTE — Progress Notes (Signed)
Electrophysiology Office Note   Date:  05/29/2018   ID:  Dillon Andrews, Dillon Andrews 05/12/1940, MRN [id number]  PCP:  [name], MD  Cardiologist:  Esmond Harps Primary Electrophysiologist:  Will Meredith Leeds, MD    No chief complaint on file.    History of Present Illness: Dillon Andrews is a 78 y.o. male who is being seen today for the evaluation of complete heart block at the request of [name],*. Presenting today for electrophysiology evaluation.  He has a history of atypical atrial flutter, chronic systolic heart failure due to nonischemic cardiomyopathy, presumed [name] with esophageal varices, hypertension, hyperlipidemia, diabetes.  He presented to the hospital September 2019 and had a Medtronic dual-chamber pacemaker implanted 01/08/2018.  He is status post atrial flutter ablation.  During the ablation, he had multiple atrial flutter circuits.  He did have ablation for typical atrial flutter.   Today, denies symptoms of palpitations, chest pain, shortness of breath, orthopnea, PND, lower extremity edema, claudication, dizziness, presyncope, syncope, bleeding, or neurologic sequela. The patient is tolerating medications without difficulties.  Overall he is feeling well.  He has no chest pain or shortness of breath.  Unfortunately his RV threshold remains elevated.   Past Medical History:  Diagnosis Date  . Anemia 11/2016  . Arthritis    "some; hands/fingers" (06/25/2017)  . Atrial tachycardia ([place])   . Barrett's esophagus   . Basal cell carcinoma    "face, arms, hands" (06/25/2017)  . Chicken pox   . Cholelithiasis 11/2016   S/P LAP CHOLE  . Chronic combined systolic and diastolic CHF (congestive heart failure) ([place])   . CKD (chronic kidney disease), stage III ([place])   . Esophageal varices (H[facility])    Secondary to NAFLD  . Family history of adverse reaction to anesthesia    "daughter's sat drop very low; last OR had to be reintubated"  (06/25/2017)  . GERD (gastroesophageal reflux disease)   . Goals of care, counseling/discussion 10/22/2017  . High cholesterol   . Hypertension   . Kappa light chain myeloma ([place]) 10/22/2017  . Mitral regurgitation   . NAFLD (nonalcoholic fatty liver disease)   . NICM (nonischemic cardiomyopathy) ([place])    Echo 06/2017: Severe LVH.  EF 25 to 30% with mild HK of the baseline after septal wall.  Moderate to severely dilated right atrium, moderately dilated RV. (Concern fr ? amyloidosis).  . NSVT (nonsustained ventricular tachycardia) ([place])   . Pneumonia 09/2016  . Presence of permanent cardiac pacemaker 01/08/2018  . RBBB   . Tricuspid regurgitation   . Type II diabetes mellitus ([place])    Past Surgical History:  Procedure Laterality Date  . A-FLUTTER ABLATION N/A 05/22/2018   Procedure: A-FLUTTER ABLATION;  Surgeon: Constance Haw, MD;  Location: [place] [facility];  Service: Cardiovascular;  Laterality: N/A;  . BACK SURGERY    . BASAL CELL CARCINOMA EXCISION Right    temple; "had to do skin graft"  . CARDIAC CATHETERIZATION  09/2016   Minimal, non-occlusive CAD, EF 45% [facility])  . CARDIOVERSION N/A 11/10/2017   Procedure: CARDIOVERSION;  Surgeon: Jolaine Artist, MD;  Location: [facility] ENDOSCOPY;  Service: Cardiovascular;  Laterality: N/A;  . CARDIOVERSION N/A 11/14/2017   Procedure: CARDIOVERSION;  Surgeon: Jolaine Artist, MD;  Location: [facility] ENDOSCOPY;  Service: Cardiovascular;  Laterality: N/A;  . CARDIOVERSION N/A 12/19/2017   Procedure: CARDIOVERSION;  Surgeon: Jolaine Artist, MD;  Location: [place];  Service: Cardiovascular;  Laterality: N/A;  .  CARDIOVERSION N/A 05/14/2018   Procedure: CARDIOVERSION;  Surgeon: Jolaine Artist, MD;  Location: [facility] ENDOSCOPY;  Service: Cardiovascular;  Laterality: N/A;  . CARPAL TUNNEL RELEASE Bilateral   . CATARACT EXTRACTION W/ INTRAOCULAR LENS  IMPLANT, BILATERAL Bilateral   . CHOLECYSTECTOMY N/A 11/22/2016   Procedure:  LAPAROSCOPIC CHOLECYSTECTOMY;  Surgeon: Coralie Keens, MD;  Location: [place];  Service: General;  Laterality: N/A;  . IR FLUORO GUIDE CV LINE RIGHT  11/26/2017  . IR US GUIDE VASC ACCESS RIGHT  11/26/2017  . JOINT REPLACEMENT    . LUMBAR DISC SURGERY  X 2  . PACEMAKER IMPLANT N/A 01/08/2018   Procedure: PACEMAKER IMPLANT;  Surgeon: Constance Haw, MD;  Location: [place] CV LAB;  Service: Cardiovascular;  Laterality: N/A;  . TEE WITHOUT CARDIOVERSION N/A 11/10/2017   Procedure: TRANSESOPHAGEAL ECHOCARDIOGRAM (TEE);  Surgeon: Jolaine Artist, MD;  Location: [facility] ENDOSCOPY;  Service: Cardiovascular;  Laterality: N/A;  . TONSILLECTOMY AND ADENOIDECTOMY    . TOTAL KNEE ARTHROPLASTY Left 2010  . TRANSESOPHAGEAL ECHOCARDIOGRAM  09/2016   Mild global LV hypokinesis. Moderate MR. Moderate TR. [facility]  . TRANSTHORACIC ECHOCARDIOGRAM  10/2016   [facility]) EF 35-40% with moderate HK. Normal RV function. Moderately severe TR.  Marland Kitchen TRANSTHORACIC ECHOCARDIOGRAM  03/2017; 06/2017    A) ([place]) mild concentric LVH. EF of 40-45%. Diffuse HK with no RWMA. GR 2 DD. Septal dyssynergy. Mild MR, moderate TR. Severe RA dilation..;; B) Severe LVH.  EF 25 to 30% with mild HK of Septal wall.  Mod-severely dilated RA & Mod dilated RV.     Current Outpatient Medications  Medication Sig Dispense Refill  . dronabinol (MARINOL) 5 MG capsule Take 5 mg by mouth 2 (two) times daily before a meal.    . gabapentin (NEURONTIN) 800 MG tablet Take 400 mg by mouth daily.    Marland Kitchen glucose blood test strip Use as instructed 100 each 12  . hydrocortisone (ANUSOL-HC) 25 MG suppository Place 25 mg rectally 2 (two) times daily as needed for hemorrhoids or anal itching.    . Magnesium 500 MG TABS Take 500 mg by mouth at bedtime.    . Melatonin 5 MG TABS Take 5 mg by mouth at bedtime.     . milrinone (PRIMACOR) 20 MG/100 ML SOLN infusion Inject 0.0099 mg/min into the vein continuous. 100 mL 0  .  Multiple Vitamin (MULTIVITAMIN WITH MINERALS) TABS tablet Take 1 tablet by mouth daily.    Marland Kitchen omeprazole (PRILOSEC) 40 MG capsule TAKE 1 CAPSULE(40 MG) BY MOUTH DAILY (Patient taking differently: Take 40 mg by mouth daily. ) 90 capsule 1  . ondansetron (ZOFRAN) 4 MG tablet TAKE 1 TABLET(4 MG) BY MOUTH EVERY 8 HOURS AS NEEDED FOR NAUSEA OR VOMITING (Patient taking differently: Take 4 mg by mouth 2 (two) times daily. ) 30 tablet 2  . polyethylene glycol (MIRALAX / GLYCOLAX) packet Take 17 g by mouth daily. (Patient taking differently: Take 17 g by mouth daily as needed for moderate constipation. ) 14 each 0  . potassium chloride SA (K-DUR,KLOR-CON) 20 MEQ tablet Take 20 mEq by mouth See admin instructions. Take 20 meq every morning may increase to 40 meq once daily as needed for excessive weight gain / swelling (take with 40 meq with 40 mg of torsemide)    . rosuvastatin (CRESTOR) 10 MG tablet TAKE 1 TABLET BY MOUTH ONCE DAILY (Patient taking differently: Take 10 mg by mouth daily. ) 90 tablet 1  . sertraline (  ZOLOFT) 25 MG tablet TAKE 1 TABLET BY MOUTH EVERY DAY (Patient taking differently: Take 25 mg by mouth daily. ) 90 tablet 0  . torsemide (DEMADEX) 20 MG tablet Take 1 tablet (20 mg total) by mouth daily. Can take 20 mg extra as needed. (Patient taking differently: Take 20-40 mg by mouth See admin instructions. Take 20 mg daily, may take an 40 mg instead if there is excessive weight gain/swelling) 40 tablet 3  . traZODone (DESYREL) 50 MG tablet TAKE 1 TABLET(50 MG) BY MOUTH AT BEDTIME (Patient taking differently: Take 50 mg by mouth at bedtime. ) 90 tablet 1  . vitamin B-12 (CYANOCOBALAMIN) 500 MCG tablet Take 500 mcg by mouth every other day.    Marland Kitchen VYNDAQEL 20 MG CAPS Take 80 mg by mouth daily. (4 capsules) (Patient taking differently: Take 80 mg by mouth every evening. ) 120 capsule   . zolpidem (AMBIEN) 5 MG tablet TAKE 1 TABLET(5 MG) BY MOUTH AT BEDTIME (Patient taking differently: Take 5 mg by mouth  at bedtime. ) 30 tablet 2  . apixaban (ELIQUIS) 5 MG TABS tablet Take 1 tablet (5 mg total) by mouth 2 (two) times daily. 60 tablet 0   No current facility-administered medications for this visit.     Allergies:   Patient has no known allergies.   Social History:  The patient  reports that he quit smoking about 41 years ago. His smoking use included cigarettes. He has a 4.50 pack-year smoking history. He has never used smokeless tobacco. He reports current alcohol use. He reports that he does not use drugs.   Family History:  The patient's family history includes Alcohol abuse in his brother; Autoimmune disease in his daughter; Breast cancer in his sister; Cancer in his father; Cataracts in his father; Heart failure in his mother; Liver disease in his brother.    ROS:  Please see the history of present illness.   Otherwise, review of systems is positive for none.   All other systems are reviewed and negative.   PHYSICAL EXAM: VS:  BP 106/64   Pulse 82   Ht 5' 10"  (1.778 m)   Wt 179 lb 6.4 oz (81.4 kg)   SpO2 95%   BMI 25.74 kg/m  , BMI Body mass index is 25.74 kg/m. GEN: Well nourished, well developed, in no acute distress  HEENT: normal  Neck: no JVD, carotid bruits, or masses Cardiac: RRR; no murmurs, rubs, or gallops,no edema  Respiratory:  clear to auscultation bilaterally, normal work of breathing GI: soft, nontender, nondistended, + BS MS: no deformity or atrophy  Skin: warm and dry, device site well healed Neuro:  Strength and sensation are intact Psych: euthymic mood, full affect  EKG:  EKG is ordered today. Personal review of the ekg ordered shows AV paced  Personal review of the device interrogation today. Results in [place]: 11/16/2017: Magnesium 2.0 11/19/2017: TSH 1.381 01/05/2018: B Natriuretic Peptide 516.7 05/19/2018: ALT 27; Hemoglobin 13.8; Platelet Count 112 05/23/2018: BUN 12; Creatinine, Ser 1.43; Potassium 3.0; Sodium 144    Lipid Panel    No results found for: CHOL, TRIG, HDL, CHOLHDL, VLDL, LDLCALC, LDLDIRECT   Wt Readings from Last 3 Encounters:  05/29/18 179 lb 6.4 oz (81.4 kg)  05/23/18 178 lb 3.2 oz (80.8 kg)  05/20/18 179 lb (81.2 kg)      Other studies Reviewed: Additional studies/ records that were reviewed today include: TTE 04/14/2018 Review of the above records today demonstrates:  -  Left ventricle: The cavity size was normal. Wall thickness was   increased in a pattern of severe LVH. Systolic function was   moderately reduced. The estimated ejection fraction was in the   range of 35% to 40%. Diffuse hypokinesis. Doppler parameters are   consistent with a reversible restrictive pattern, indicative of   decreased left ventricular diastolic compliance and/or increased   left atrial pressure (grade 3 diastolic dysfunction). - Ventricular septum: The contour showed diastolic flattening. - Aortic valve: Trileaflet; mildly thickened, mildly calcified   leaflets. - Mitral valve: There was mild regurgitation. - Left atrium: The atrium was moderately dilated. - Right ventricle: The cavity size was moderately dilated. Wall   thickness was normal. - Right atrium: The atrium was moderately dilated. - Pulmonary arteries: Systolic pressure was moderately to severely   increased. PA peak pressure: 68 mm Hg (S).   ASSESSMENT AND PLAN:  1.  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  2. Atrial flutter: That is post ablation for typical flutter.  Unfortunately he did have many atypical flutter circuits.  He remains in sinus rhythm.  His creatinine has improved and thus we will increase his Eliquis dose.  This patients CHA2DS2-VASc Score and unadjusted Ischemic Stroke Rate (% per year) is equal to 4.8 % stroke rate/year from a score of 4  Above score calculated as 1 point each if present [CHF, HTN, DM, Vascular=MI/PAD/Aortic Plaque, Age if 65-74, or Male] Above score  calculated as 2 points each if present [Age > 75, or Stroke/TIA/TE]   3. Complete heart block: Status post Medtronic dual-chamber pacemaker implanted 01/02/2018.  Thresholds have gone up since ablation.  Will need RV lead revision.  And benefits were discussed.  Risk include bleeding, tamponade, pneumothorax, infection.  Understands these risks and is agreed to the procedure.   Current medicines are reviewed at length with the patient today.   The patient does not have concerns regarding his medicines.  The following changes were made today: Increase Eliquis  Labs/ tests ordered today include:  Orders Placed This Encounter  Procedures  . Basic metabolic panel  . EKG 12-Lead   Case discussed with primary cardiology  Disposition:   FU with Will Camnitz 3 months  Signed, Will Meredith Leeds, MD  05/29/2018 11:00 AM     [address] [place] [address] [phone] (office) [phone] (fax)

## 2018-06-02 DIAGNOSIS — C44329 Squamous cell carcinoma of skin of other parts of face: Secondary | ICD-10-CM | POA: Diagnosis not present

## 2018-06-02 DIAGNOSIS — C44622 Squamous cell carcinoma of skin of right upper limb, including shoulder: Secondary | ICD-10-CM | POA: Diagnosis not present

## 2018-06-02 NOTE — Anesthesia Postprocedure Evaluation (Signed)
Anesthesia Post Note  Patient: Dillon Andrews  Procedure(s) Performed: A-FLUTTER ABLATION (N/A )     Patient location during evaluation: PACU Anesthesia Type: General Level of consciousness: awake and alert Pain management: pain level controlled Vital Signs Assessment: post-procedure vital signs reviewed and stable Respiratory status: spontaneous breathing, nonlabored ventilation, respiratory function stable and patient connected to nasal cannula oxygen Cardiovascular status: blood pressure returned to baseline and stable Postop Assessment: no apparent nausea or vomiting Anesthetic complications: no    Last Vitals:  Vitals:   05/23/18 1233 05/23/18 1652  BP: 107/75 102/71  Pulse: 80 80  Resp: 18 18  Temp: 37.1 C 37.3 C  SpO2: 94% 93%    Last Pain:  Vitals:   05/23/18 1652  TempSrc: Oral  PainSc:                  [name] S

## 2018-06-04 DIAGNOSIS — I130 Hypertensive heart and chronic kidney disease with heart failure and stage 1 through stage 4 chronic kidney disease, or unspecified chronic kidney disease: Secondary | ICD-10-CM | POA: Diagnosis not present

## 2018-06-04 DIAGNOSIS — I5043 Acute on chronic combined systolic (congestive) and diastolic (congestive) heart failure: Secondary | ICD-10-CM | POA: Diagnosis not present

## 2018-06-04 DIAGNOSIS — I4891 Unspecified atrial fibrillation: Secondary | ICD-10-CM | POA: Diagnosis not present

## 2018-06-04 DIAGNOSIS — E1122 Type 2 diabetes mellitus with diabetic chronic kidney disease: Secondary | ICD-10-CM | POA: Diagnosis not present

## 2018-06-04 DIAGNOSIS — E119 Type 2 diabetes mellitus without complications: Secondary | ICD-10-CM | POA: Diagnosis not present

## 2018-06-04 DIAGNOSIS — K7460 Unspecified cirrhosis of liver: Secondary | ICD-10-CM | POA: Diagnosis not present

## 2018-06-04 DIAGNOSIS — I429 Cardiomyopathy, unspecified: Secondary | ICD-10-CM | POA: Diagnosis not present

## 2018-06-04 DIAGNOSIS — N183 Chronic kidney disease, stage 3 (moderate): Secondary | ICD-10-CM | POA: Diagnosis not present

## 2018-06-04 DIAGNOSIS — C9000 Multiple myeloma not having achieved remission: Secondary | ICD-10-CM | POA: Diagnosis not present

## 2018-06-04 DIAGNOSIS — D631 Anemia in chronic kidney disease: Secondary | ICD-10-CM | POA: Diagnosis not present

## 2018-06-08 ENCOUNTER — Other Ambulatory Visit: Payer: Medicare HMO

## 2018-06-08 ENCOUNTER — Ambulatory Visit: Payer: Medicare HMO | Admitting: Hematology & Oncology

## 2018-06-11 ENCOUNTER — Inpatient Hospital Stay (HOSPITAL_COMMUNITY)
Admission: RE | Admit: 2018-06-11 | Discharge: 2018-07-15 | DRG: 260 | Disposition: E | Payer: Medicare HMO | Attending: Cardiology | Admitting: Cardiology

## 2018-06-11 ENCOUNTER — Inpatient Hospital Stay (HOSPITAL_COMMUNITY): Admission: RE | Disposition: E | Payer: Self-pay | Source: Home / Self Care | Attending: Cardiology

## 2018-06-11 DIAGNOSIS — E119 Type 2 diabetes mellitus without complications: Secondary | ICD-10-CM | POA: Diagnosis not present

## 2018-06-11 DIAGNOSIS — N179 Acute kidney failure, unspecified: Secondary | ICD-10-CM | POA: Diagnosis not present

## 2018-06-11 DIAGNOSIS — D631 Anemia in chronic kidney disease: Secondary | ICD-10-CM | POA: Diagnosis present

## 2018-06-11 DIAGNOSIS — J9811 Atelectasis: Secondary | ICD-10-CM | POA: Diagnosis not present

## 2018-06-11 DIAGNOSIS — J811 Chronic pulmonary edema: Secondary | ICD-10-CM | POA: Diagnosis not present

## 2018-06-11 DIAGNOSIS — E1122 Type 2 diabetes mellitus with diabetic chronic kidney disease: Secondary | ICD-10-CM | POA: Diagnosis present

## 2018-06-11 DIAGNOSIS — I442 Atrioventricular block, complete: Secondary | ICD-10-CM | POA: Diagnosis present

## 2018-06-11 DIAGNOSIS — Z9842 Cataract extraction status, left eye: Secondary | ICD-10-CM

## 2018-06-11 DIAGNOSIS — I130 Hypertensive heart and chronic kidney disease with heart failure and stage 1 through stage 4 chronic kidney disease, or unspecified chronic kidney disease: Secondary | ICD-10-CM | POA: Diagnosis present

## 2018-06-11 DIAGNOSIS — N183 Chronic kidney disease, stage 3 (moderate): Secondary | ICD-10-CM | POA: Diagnosis present

## 2018-06-11 DIAGNOSIS — J9690 Respiratory failure, unspecified, unspecified whether with hypoxia or hypercapnia: Secondary | ICD-10-CM | POA: Diagnosis not present

## 2018-06-11 DIAGNOSIS — Y712 Prosthetic and other implants, materials and accessory cardiovascular devices associated with adverse incidents: Secondary | ICD-10-CM | POA: Diagnosis present

## 2018-06-11 DIAGNOSIS — G9340 Encephalopathy, unspecified: Secondary | ICD-10-CM | POA: Diagnosis not present

## 2018-06-11 DIAGNOSIS — Z515 Encounter for palliative care: Secondary | ICD-10-CM | POA: Diagnosis present

## 2018-06-11 DIAGNOSIS — I4892 Unspecified atrial flutter: Secondary | ICD-10-CM | POA: Diagnosis present

## 2018-06-11 DIAGNOSIS — T82110A Breakdown (mechanical) of cardiac electrode, initial encounter: Secondary | ICD-10-CM | POA: Diagnosis not present

## 2018-06-11 DIAGNOSIS — E854 Organ-limited amyloidosis: Secondary | ICD-10-CM | POA: Diagnosis present

## 2018-06-11 DIAGNOSIS — Z4682 Encounter for fitting and adjustment of non-vascular catheter: Secondary | ICD-10-CM | POA: Diagnosis not present

## 2018-06-11 DIAGNOSIS — Z6825 Body mass index (BMI) 25.0-25.9, adult: Secondary | ICD-10-CM

## 2018-06-11 DIAGNOSIS — Z5181 Encounter for therapeutic drug level monitoring: Secondary | ICD-10-CM | POA: Diagnosis not present

## 2018-06-11 DIAGNOSIS — Z961 Presence of intraocular lens: Secondary | ICD-10-CM | POA: Diagnosis present

## 2018-06-11 DIAGNOSIS — I4891 Unspecified atrial fibrillation: Secondary | ICD-10-CM | POA: Diagnosis not present

## 2018-06-11 DIAGNOSIS — Z01818 Encounter for other preprocedural examination: Secondary | ICD-10-CM

## 2018-06-11 DIAGNOSIS — J90 Pleural effusion, not elsewhere classified: Secondary | ICD-10-CM | POA: Diagnosis not present

## 2018-06-11 DIAGNOSIS — C9000 Multiple myeloma not having achieved remission: Secondary | ICD-10-CM | POA: Diagnosis present

## 2018-06-11 DIAGNOSIS — I5023 Acute on chronic systolic (congestive) heart failure: Secondary | ICD-10-CM | POA: Diagnosis not present

## 2018-06-11 DIAGNOSIS — R570 Cardiogenic shock: Secondary | ICD-10-CM | POA: Diagnosis not present

## 2018-06-11 DIAGNOSIS — E871 Hypo-osmolality and hyponatremia: Secondary | ICD-10-CM | POA: Diagnosis not present

## 2018-06-11 DIAGNOSIS — Z452 Encounter for adjustment and management of vascular access device: Secondary | ICD-10-CM | POA: Diagnosis not present

## 2018-06-11 DIAGNOSIS — J398 Other specified diseases of upper respiratory tract: Secondary | ICD-10-CM

## 2018-06-11 DIAGNOSIS — N390 Urinary tract infection, site not specified: Secondary | ICD-10-CM | POA: Diagnosis not present

## 2018-06-11 DIAGNOSIS — I428 Other cardiomyopathies: Secondary | ICD-10-CM | POA: Diagnosis present

## 2018-06-11 DIAGNOSIS — Z9911 Dependence on respirator [ventilator] status: Secondary | ICD-10-CM | POA: Diagnosis not present

## 2018-06-11 DIAGNOSIS — T82120A Displacement of cardiac electrode, initial encounter: Secondary | ICD-10-CM | POA: Diagnosis present

## 2018-06-11 DIAGNOSIS — Z95818 Presence of other cardiac implants and grafts: Secondary | ICD-10-CM

## 2018-06-11 DIAGNOSIS — R319 Hematuria, unspecified: Secondary | ICD-10-CM | POA: Diagnosis not present

## 2018-06-11 DIAGNOSIS — Z9289 Personal history of other medical treatment: Secondary | ICD-10-CM

## 2018-06-11 DIAGNOSIS — K5900 Constipation, unspecified: Secondary | ICD-10-CM | POA: Diagnosis not present

## 2018-06-11 DIAGNOSIS — I472 Ventricular tachycardia: Secondary | ICD-10-CM | POA: Diagnosis not present

## 2018-06-11 DIAGNOSIS — I429 Cardiomyopathy, unspecified: Secondary | ICD-10-CM | POA: Diagnosis not present

## 2018-06-11 DIAGNOSIS — Z7901 Long term (current) use of anticoagulants: Secondary | ICD-10-CM

## 2018-06-11 DIAGNOSIS — I480 Paroxysmal atrial fibrillation: Secondary | ICD-10-CM | POA: Diagnosis present

## 2018-06-11 DIAGNOSIS — Z87891 Personal history of nicotine dependence: Secondary | ICD-10-CM

## 2018-06-11 DIAGNOSIS — K7581 Nonalcoholic steatohepatitis (NASH): Secondary | ICD-10-CM | POA: Diagnosis present

## 2018-06-11 DIAGNOSIS — R579 Shock, unspecified: Secondary | ICD-10-CM | POA: Diagnosis not present

## 2018-06-11 DIAGNOSIS — I4510 Unspecified right bundle-branch block: Secondary | ICD-10-CM | POA: Diagnosis present

## 2018-06-11 DIAGNOSIS — I5043 Acute on chronic combined systolic (congestive) and diastolic (congestive) heart failure: Secondary | ICD-10-CM | POA: Diagnosis present

## 2018-06-11 DIAGNOSIS — E873 Alkalosis: Secondary | ICD-10-CM | POA: Diagnosis not present

## 2018-06-11 DIAGNOSIS — N170 Acute kidney failure with tubular necrosis: Secondary | ICD-10-CM | POA: Diagnosis present

## 2018-06-11 DIAGNOSIS — Z9841 Cataract extraction status, right eye: Secondary | ICD-10-CM

## 2018-06-11 DIAGNOSIS — K7460 Unspecified cirrhosis of liver: Secondary | ICD-10-CM | POA: Diagnosis present

## 2018-06-11 DIAGNOSIS — J9601 Acute respiratory failure with hypoxia: Secondary | ICD-10-CM | POA: Diagnosis not present

## 2018-06-11 DIAGNOSIS — I509 Heart failure, unspecified: Secondary | ICD-10-CM | POA: Diagnosis not present

## 2018-06-11 DIAGNOSIS — J810 Acute pulmonary edema: Secondary | ICD-10-CM | POA: Diagnosis not present

## 2018-06-11 DIAGNOSIS — E440 Moderate protein-calorie malnutrition: Secondary | ICD-10-CM | POA: Diagnosis present

## 2018-06-11 DIAGNOSIS — E875 Hyperkalemia: Secondary | ICD-10-CM | POA: Diagnosis present

## 2018-06-11 DIAGNOSIS — Z992 Dependence on renal dialysis: Secondary | ICD-10-CM

## 2018-06-11 DIAGNOSIS — I5021 Acute systolic (congestive) heart failure: Secondary | ICD-10-CM | POA: Diagnosis not present

## 2018-06-11 DIAGNOSIS — Z79899 Other long term (current) drug therapy: Secondary | ICD-10-CM

## 2018-06-11 DIAGNOSIS — E1165 Type 2 diabetes mellitus with hyperglycemia: Secondary | ICD-10-CM | POA: Diagnosis not present

## 2018-06-11 DIAGNOSIS — E876 Hypokalemia: Secondary | ICD-10-CM | POA: Diagnosis not present

## 2018-06-11 HISTORY — PX: LEAD REVISION/REPAIR: EP1213

## 2018-06-11 LAB — GLUCOSE, CAPILLARY
GLUCOSE-CAPILLARY: 92 mg/dL (ref 70–99)
Glucose-Capillary: 113 mg/dL — ABNORMAL HIGH (ref 70–99)
Glucose-Capillary: 185 mg/dL — ABNORMAL HIGH (ref 70–99)

## 2018-06-11 LAB — SURGICAL PCR SCREEN
MRSA, PCR: NEGATIVE
Staphylococcus aureus: NEGATIVE

## 2018-06-11 SURGERY — LEAD REVISION/REPAIR
Anesthesia: LOCAL

## 2018-06-11 MED ORDER — HEPARIN (PORCINE) IN NACL 1000-0.9 UT/500ML-% IV SOLN
INTRAVENOUS | Status: DC | PRN
  Administered 2018-06-11: 500 mL

## 2018-06-11 MED ORDER — LIDOCAINE HCL 1 % IJ SOLN
INTRAMUSCULAR | Status: AC
  Filled 2018-06-11: qty 60

## 2018-06-11 MED ORDER — ROSUVASTATIN CALCIUM 5 MG PO TABS
10.0000 mg | ORAL_TABLET | Freq: Every day | ORAL | Status: DC
  Administered 2018-06-12 – 2018-06-22 (×11): 10 mg via ORAL
  Filled 2018-06-11 (×11): qty 2

## 2018-06-11 MED ORDER — TRAZODONE HCL 50 MG PO TABS
50.0000 mg | ORAL_TABLET | Freq: Every day | ORAL | Status: DC
  Administered 2018-06-11 – 2018-06-21 (×11): 50 mg via ORAL
  Filled 2018-06-11 (×11): qty 1

## 2018-06-11 MED ORDER — HYDROCORTISONE ACETATE 25 MG RE SUPP
25.0000 mg | Freq: Two times a day (BID) | RECTAL | Status: DC | PRN
  Filled 2018-06-11: qty 1

## 2018-06-11 MED ORDER — SODIUM CHLORIDE 0.9 % IV SOLN
INTRAVENOUS | Status: AC
  Filled 2018-06-11: qty 2

## 2018-06-11 MED ORDER — LIDOCAINE HCL (PF) 1 % IJ SOLN
INTRAMUSCULAR | Status: DC | PRN
  Administered 2018-06-11: 60 mL

## 2018-06-11 MED ORDER — MILRINONE LACTATE IN DEXTROSE 20-5 MG/100ML-% IV SOLN: 0.1250 ug/kg/min | INTRAVENOUS | Status: DC

## 2018-06-11 MED ORDER — MUPIROCIN 2 % EX OINT
1.0000 "application " | TOPICAL_OINTMENT | Freq: Once | CUTANEOUS | Status: AC
  Administered 2018-06-11: 1 via TOPICAL
  Filled 2018-06-11: qty 22

## 2018-06-11 MED ORDER — ONDANSETRON HCL 4 MG PO TABS
4.0000 mg | ORAL_TABLET | Freq: Two times a day (BID) | ORAL | Status: DC
  Administered 2018-06-12 – 2018-06-17 (×11): 4 mg via ORAL
  Filled 2018-06-11 (×10): qty 1

## 2018-06-11 MED ORDER — VITAMIN B-12 1000 MCG PO TABS
500.0000 ug | ORAL_TABLET | ORAL | Status: DC
  Administered 2018-06-12 – 2018-06-22 (×6): 500 ug via ORAL
  Filled 2018-06-11 (×8): qty 1

## 2018-06-11 MED ORDER — FENTANYL CITRATE (PF) 100 MCG/2ML IJ SOLN
INTRAMUSCULAR | Status: AC
  Filled 2018-06-11: qty 2

## 2018-06-11 MED ORDER — POTASSIUM CHLORIDE CRYS ER 20 MEQ PO TBCR
20.0000 meq | EXTENDED_RELEASE_TABLET | Freq: Every day | ORAL | Status: DC
  Administered 2018-06-12: 20 meq via ORAL
  Filled 2018-06-11: qty 1

## 2018-06-11 MED ORDER — MELATONIN 3 MG PO TABS
6.0000 mg | ORAL_TABLET | Freq: Every day | ORAL | Status: DC
  Administered 2018-06-11 – 2018-06-21 (×6): 6 mg via ORAL
  Filled 2018-06-11 (×11): qty 2

## 2018-06-11 MED ORDER — CEFAZOLIN SODIUM-DEXTROSE 2-4 GM/100ML-% IV SOLN
INTRAVENOUS | Status: AC
  Filled 2018-06-11: qty 100

## 2018-06-11 MED ORDER — CEFAZOLIN SODIUM-DEXTROSE 2-4 GM/100ML-% IV SOLN
2.0000 g | INTRAVENOUS | Status: AC
  Administered 2018-06-11: 2 g via INTRAVENOUS
  Filled 2018-06-11: qty 100

## 2018-06-11 MED ORDER — SODIUM CHLORIDE 0.9 % IV SOLN
INTRAVENOUS | Status: DC
  Administered 2018-06-11: 13:00:00 via INTRAVENOUS

## 2018-06-11 MED ORDER — HEPARIN (PORCINE) IN NACL 1000-0.9 UT/500ML-% IV SOLN
INTRAVENOUS | Status: AC
  Filled 2018-06-11: qty 500

## 2018-06-11 MED ORDER — APIXABAN 5 MG PO TABS
5.0000 mg | ORAL_TABLET | Freq: Two times a day (BID) | ORAL | Status: DC
  Administered 2018-06-11 – 2018-06-15 (×9): 5 mg via ORAL
  Filled 2018-06-11 (×9): qty 1

## 2018-06-11 MED ORDER — MAGNESIUM OXIDE 400 (241.3 MG) MG PO TABS
400.0000 mg | ORAL_TABLET | Freq: Every day | ORAL | Status: DC
  Administered 2018-06-11 – 2018-06-16 (×6): 400 mg via ORAL
  Filled 2018-06-11 (×6): qty 1

## 2018-06-11 MED ORDER — SERTRALINE HCL 50 MG PO TABS
25.0000 mg | ORAL_TABLET | Freq: Every day | ORAL | Status: DC
  Administered 2018-06-12 – 2018-06-22 (×11): 25 mg via ORAL
  Filled 2018-06-11 (×11): qty 1

## 2018-06-11 MED ORDER — SODIUM CHLORIDE 0.9 % IV SOLN
80.0000 mg | INTRAVENOUS | Status: AC
  Administered 2018-06-11: 80 mg

## 2018-06-11 MED ORDER — MIDAZOLAM HCL 5 MG/5ML IJ SOLN
INTRAMUSCULAR | Status: DC | PRN
  Administered 2018-06-11 (×4): 1 mg via INTRAVENOUS

## 2018-06-11 MED ORDER — CEFAZOLIN SODIUM-DEXTROSE 1-4 GM/50ML-% IV SOLN
1.0000 g | Freq: Four times a day (QID) | INTRAVENOUS | Status: AC
  Administered 2018-06-11 – 2018-06-12 (×3): 1 g via INTRAVENOUS
  Filled 2018-06-11 (×3): qty 50

## 2018-06-11 MED ORDER — ZOLPIDEM TARTRATE 5 MG PO TABS
5.0000 mg | ORAL_TABLET | Freq: Every day | ORAL | Status: DC
  Administered 2018-06-11 – 2018-06-20 (×9): 5 mg via ORAL
  Filled 2018-06-11 (×10): qty 1

## 2018-06-11 MED ORDER — YOU HAVE A PACEMAKER BOOK
Freq: Once | Status: AC
  Administered 2018-06-12: 1
  Filled 2018-06-11: qty 1

## 2018-06-11 MED ORDER — MIDAZOLAM HCL 5 MG/5ML IJ SOLN
INTRAMUSCULAR | Status: AC
  Filled 2018-06-11: qty 5

## 2018-06-11 MED ORDER — MAGNESIUM 500 MG PO TABS: 500.0000 mg | ORAL_TABLET | Freq: Every day | ORAL | Status: DC

## 2018-06-11 MED ORDER — ADULT MULTIVITAMIN W/MINERALS CH
1.0000 | ORAL_TABLET | Freq: Every day | ORAL | Status: DC
  Administered 2018-06-12 – 2018-06-17 (×6): 1 via ORAL
  Filled 2018-06-11 (×6): qty 1

## 2018-06-11 MED ORDER — MUPIROCIN 2 % EX OINT
TOPICAL_OINTMENT | CUTANEOUS | Status: AC
  Administered 2018-06-11: 1 via TOPICAL
  Filled 2018-06-11: qty 22

## 2018-06-11 MED ORDER — GABAPENTIN 800 MG PO TABS
400.0000 mg | ORAL_TABLET | Freq: Every day | ORAL | Status: DC
  Filled 2018-06-11 (×2): qty 0.5

## 2018-06-11 MED ORDER — PANTOPRAZOLE SODIUM 40 MG PO TBEC
40.0000 mg | DELAYED_RELEASE_TABLET | Freq: Every day | ORAL | Status: DC
  Administered 2018-06-12: 40 mg via ORAL
  Filled 2018-06-11: qty 1

## 2018-06-11 MED ORDER — TORSEMIDE 20 MG PO TABS: 20.0000 mg | ORAL_TABLET | Freq: Every day | ORAL | Status: DC

## 2018-06-11 MED ORDER — GABAPENTIN 400 MG PO CAPS
400.0000 mg | ORAL_CAPSULE | Freq: Every day | ORAL | Status: DC
  Administered 2018-06-11 – 2018-06-21 (×11): 400 mg via ORAL
  Filled 2018-06-11 (×11): qty 1

## 2018-06-11 MED ORDER — CHLORHEXIDINE GLUCONATE 4 % EX LIQD
60.0000 mL | Freq: Once | CUTANEOUS | Status: DC
  Filled 2018-06-11: qty 60

## 2018-06-11 MED ORDER — TAFAMIDIS MEGLUMINE (CARDIAC) 20 MG PO CAPS
80.0000 mg | ORAL_CAPSULE | Freq: Every evening | ORAL | Status: DC
  Administered 2018-06-11: 80 mg via ORAL

## 2018-06-11 MED ORDER — ACETAMINOPHEN 325 MG PO TABS
325.0000 mg | ORAL_TABLET | ORAL | Status: DC | PRN
  Administered 2018-06-11 – 2018-06-12 (×2): 650 mg via ORAL
  Filled 2018-06-11 (×2): qty 2

## 2018-06-11 MED ORDER — DRONABINOL 2.5 MG PO CAPS
5.0000 mg | ORAL_CAPSULE | Freq: Two times a day (BID) | ORAL | Status: DC
  Administered 2018-06-11 – 2018-06-12 (×2): 5 mg via ORAL
  Filled 2018-06-11 (×4): qty 2

## 2018-06-11 MED ORDER — FENTANYL CITRATE (PF) 100 MCG/2ML IJ SOLN
INTRAMUSCULAR | Status: DC | PRN
  Administered 2018-06-11 (×2): 25 ug via INTRAVENOUS

## 2018-06-11 MED ORDER — MILRINONE LACTATE IN DEXTROSE 20-5 MG/100ML-% IV SOLN: 0.2500 ug/kg/min | INTRAVENOUS | Status: DC

## 2018-06-11 MED ORDER — ONDANSETRON HCL 4 MG/2ML IJ SOLN
4.0000 mg | Freq: Four times a day (QID) | INTRAMUSCULAR | Status: DC | PRN
  Administered 2018-06-12 – 2018-06-22 (×7): 4 mg via INTRAVENOUS
  Filled 2018-06-11 (×7): qty 2

## 2018-06-11 MED ORDER — ALUM & MAG HYDROXIDE-SIMETH 200-200-20 MG/5ML PO SUSP
30.0000 mL | ORAL | Status: DC | PRN
  Administered 2018-06-11: 30 mL via ORAL
  Filled 2018-06-11 (×2): qty 30

## 2018-06-11 SURGICAL SUPPLY — 11 items
CABLE SURGICAL S-101-97-12 (CABLE) ×2 IMPLANT
CATH HIS SELECTSITE C304HIS (CATHETERS) ×1 IMPLANT
DEVICE DISSECT PLASMABLAD 3.0S (MISCELLANEOUS) IMPLANT
LEAD SELECT SECURE 3830 383069 (Lead) IMPLANT
PAD PRO RADIOLUCENT 2001M-C (PAD) ×2 IMPLANT
PLASMABLADE 3.0S (MISCELLANEOUS) ×2
SELECT SECURE 3830 383069 (Lead) ×2 IMPLANT
SHEATH CLASSIC 7F (SHEATH) ×2 IMPLANT
SLITTER 6232ADJ (MISCELLANEOUS) ×1 IMPLANT
TRAY PACEMAKER INSERTION (PACKS) ×2 IMPLANT
WIRE HI TORQ VERSACORE-J 145CM (WIRE) ×1 IMPLANT

## 2018-06-11 NOTE — Progress Notes (Signed)
Pt took eliquis today at 0800 spoke with Donavan Foil, RN.  She reports Dr Angelena Form notified and will come speak with patient

## 2018-06-11 NOTE — Discharge Instructions (Signed)
° ° °  Supplemental Discharge Instructions for  Pacemaker/Defibrillator Patients  Activity No heavy lifting or vigorous activity with your left/right arm for 6 to 8 weeks.  Do not raise your left/right arm above your head for one week.  Gradually raise your affected arm as drawn below.              06/15/2018                   06/16/2018                 06/17/2018                 06/18/2018 __  NO DRIVING for  1 week   ; you may begin driving on  06/18/5730   .  WOUND CARE - Keep the wound area clean and dry.  Do not get this area wet, no showers until cleared to at your wound check visit . - The tape/steri-strips on your wound will fall off; do not pull them off.  No bandage is needed on the site.  DO  NOT apply any creams, oils, or ointments to the wound area. - If you notice any drainage or discharge from the wound, any swelling or bruising at the site, or you develop a fever > 101? F after you are discharged home, call the office at once.  Special Instructions - You are still able to use cellular telephones; use the ear opposite the side where you have your pacemaker/defibrillator.  Avoid carrying your cellular phone near your device. - When traveling through airports, show security personnel your identification card to avoid being screened in the metal detectors.  Ask the security personnel to use the hand wand. - Avoid arc welding equipment, MRI testing (magnetic resonance imaging), TENS units (transcutaneous nerve stimulators).  Call the office for questions about other devices. - Avoid electrical appliances that are in poor condition or are not properly grounded. - Microwave ovens are safe to be near or to operate.

## 2018-06-11 NOTE — H&P (Signed)
Dillon Andrews has presented today for surgery, with the diagnosis of RV lead dysfunction.  The various methods of treatment have been discussed with the patient and family. After consideration of risks, benefits and other options for treatment, the patient has consented to  Procedure(s): RV lead revision as a surgical intervention .  Risks include but not limited to bleeding, tamponade, infection, pneumothorax, among others. The patient's history has been reviewed, patient examined, no change in status, stable for surgery.  I have reviewed the patient's chart and labs.  Questions were answered to the patient's satisfaction.    Allegra Lai, MD 06/05/2018 2:39 PM

## 2018-06-11 NOTE — Progress Notes (Signed)
Orthopedic Tech Progress Note Patient Details:  Dillon Andrews November 04, 1940 [id number] RN said patient has on sling Patient ID: Dillon Andrews, male   DOB: July 08, 1940, 78 y.o.   MRN: [id number]   Janit Pagan 06/03/2018, 5:44 PM

## 2018-06-11 NOTE — Progress Notes (Signed)
Pt noted with HR ranging from 120's-140's with a lot of PVC's; asymptomatic; V/S stable. Concerned with pacemaker malfunction (sensing; pacing). Called & spoke to [employer] pacemaker rep [facility]). Was reassured to be concern more if HR is below 60's. Dr Elson Areas informed. Magnesium level ordered for AM. Will continue to monitor pt.

## 2018-06-11 NOTE — Progress Notes (Signed)
[facility]  Dillon Andrews is an active pt with [facility] on home Milrinone. [facility] will follow pt while he is at [facility] to support DC home when ready. Pt remains on [facility] Milrinone infusion via CADD pump.  If patient discharges after hours, please call [phone].   Dillon Andrews 05/23/2018, 6:44 PM

## 2018-06-12 ENCOUNTER — Ambulatory Visit (HOSPITAL_COMMUNITY): Payer: Medicare HMO

## 2018-06-12 ENCOUNTER — Encounter (HOSPITAL_COMMUNITY): Payer: Self-pay | Admitting: Cardiology

## 2018-06-12 ENCOUNTER — Other Ambulatory Visit: Payer: Self-pay

## 2018-06-12 ENCOUNTER — Other Ambulatory Visit (HOSPITAL_COMMUNITY): Payer: Self-pay | Admitting: Internal Medicine

## 2018-06-12 DIAGNOSIS — J9811 Atelectasis: Secondary | ICD-10-CM | POA: Diagnosis not present

## 2018-06-12 DIAGNOSIS — I442 Atrioventricular block, complete: Secondary | ICD-10-CM

## 2018-06-12 DIAGNOSIS — Z452 Encounter for adjustment and management of vascular access device: Secondary | ICD-10-CM | POA: Diagnosis not present

## 2018-06-12 DIAGNOSIS — N183 Chronic kidney disease, stage 3 (moderate): Secondary | ICD-10-CM

## 2018-06-12 DIAGNOSIS — R579 Shock, unspecified: Secondary | ICD-10-CM

## 2018-06-12 DIAGNOSIS — N179 Acute kidney failure, unspecified: Secondary | ICD-10-CM

## 2018-06-12 DIAGNOSIS — J9601 Acute respiratory failure with hypoxia: Secondary | ICD-10-CM

## 2018-06-12 DIAGNOSIS — I5023 Acute on chronic systolic (congestive) heart failure: Secondary | ICD-10-CM

## 2018-06-12 DIAGNOSIS — I509 Heart failure, unspecified: Secondary | ICD-10-CM | POA: Diagnosis not present

## 2018-06-12 DIAGNOSIS — I480 Paroxysmal atrial fibrillation: Secondary | ICD-10-CM

## 2018-06-12 DIAGNOSIS — R570 Cardiogenic shock: Secondary | ICD-10-CM

## 2018-06-12 DIAGNOSIS — J90 Pleural effusion, not elsewhere classified: Secondary | ICD-10-CM | POA: Diagnosis not present

## 2018-06-12 DIAGNOSIS — Z9911 Dependence on respirator [ventilator] status: Secondary | ICD-10-CM

## 2018-06-12 DIAGNOSIS — Z01818 Encounter for other preprocedural examination: Secondary | ICD-10-CM

## 2018-06-12 LAB — BASIC METABOLIC PANEL
Anion gap: 14 (ref 5–15)
BUN: 28 mg/dL — ABNORMAL HIGH (ref 8–23)
CALCIUM: 8.9 mg/dL (ref 8.9–10.3)
CO2: 25 mmol/L (ref 22–32)
Chloride: 96 mmol/L — ABNORMAL LOW (ref 98–111)
Creatinine, Ser: 2.06 mg/dL — ABNORMAL HIGH (ref 0.61–1.24)
GFR calc Af Amer: 35 mL/min — ABNORMAL LOW (ref >60)
GFR calc non Af Amer: 30 mL/min — ABNORMAL LOW (ref >60)
Glucose, Bld: 140 mg/dL — ABNORMAL HIGH (ref 70–99)
Potassium: 4.2 mmol/L (ref 3.5–5.1)
Sodium: 135 mmol/L (ref 135–145)

## 2018-06-12 LAB — CBC WITH DIFFERENTIAL/PLATELET
Abs Immature Granulocytes: 0.03 10*3/uL (ref 0.00–0.07)
BASOS ABS: 0 10*3/uL (ref 0.0–0.1)
Basophils Relative: 0 %
Eosinophils Absolute: 0.2 10*3/uL (ref 0.0–0.5)
Eosinophils Relative: 2 %
HCT: 44 % (ref 39.0–52.0)
Hemoglobin: 14 g/dL (ref 13.0–17.0)
Immature Granulocytes: 0 %
Lymphocytes Relative: 6 %
Lymphs Abs: 0.8 10*3/uL (ref 0.7–4.0)
MCH: 31.5 pg (ref 26.0–34.0)
MCHC: 31.8 g/dL (ref 30.0–36.0)
MCV: 98.9 fL (ref 80.0–100.0)
Monocytes Absolute: 1.7 10*3/uL — ABNORMAL HIGH (ref 0.1–1.0)
Monocytes Relative: 14 %
NRBC: 0 % (ref 0.0–0.2)
Neutro Abs: 9.6 10*3/uL — ABNORMAL HIGH (ref 1.7–7.7)
Neutrophils Relative %: 78 %
Platelets: 217 10*3/uL (ref 150–400)
RBC: 4.45 MIL/uL (ref 4.22–5.81)
RDW: 19.8 % — ABNORMAL HIGH (ref 11.5–15.5)
WBC: 12.3 10*3/uL — AB (ref 4.0–10.5)

## 2018-06-12 LAB — COOXEMETRY PANEL
Carboxyhemoglobin: 1.9 % — ABNORMAL HIGH (ref 0.5–1.5)
Carboxyhemoglobin: 1.2 % (ref 0.5–1.5)
METHEMOGLOBIN: 0.7 % (ref 0.0–1.5)
Methemoglobin: 1.3 % (ref 0.0–1.5)
O2 Saturation: 91.3 %
O2 Saturation: 75.9 %
Total hemoglobin: 14.7 g/dL (ref 12.0–16.0)
Total hemoglobin: 13 g/dL (ref 12.0–16.0)

## 2018-06-12 LAB — GLUCOSE, CAPILLARY
Glucose-Capillary: 130 mg/dL — ABNORMAL HIGH (ref 70–99)
Glucose-Capillary: 162 mg/dL — ABNORMAL HIGH (ref 70–99)
Glucose-Capillary: 193 mg/dL — ABNORMAL HIGH (ref 70–99)
Glucose-Capillary: 143 mg/dL — ABNORMAL HIGH (ref 70–99)

## 2018-06-12 LAB — POCT I-STAT 7, (LYTES, BLD GAS, ICA,H+H)
Bicarbonate: 24.5 mmol/L (ref 20.0–28.0)
Calcium, Ion: 1.11 mmol/L — ABNORMAL LOW (ref 1.15–1.40)
HCT: 44 % (ref 39.0–52.0)
Hemoglobin: 15 g/dL (ref 13.0–17.0)
O2 Saturation: 99 %
Patient temperature: 97.5
Potassium: 3.9 mmol/L (ref 3.5–5.1)
Sodium: 133 mmol/L — ABNORMAL LOW (ref 135–145)
TCO2: 26 mmol/L (ref 22–32)
pCO2 arterial: 36.9 mmHg (ref 32.0–48.0)
pH, Arterial: 7.428 (ref 7.350–7.450)
pO2, Arterial: 115 mmHg — ABNORMAL HIGH (ref 83.0–108.0)

## 2018-06-12 LAB — MAGNESIUM
Magnesium: 1.9 mg/dL (ref 1.7–2.4)
Magnesium: 2.6 mg/dL — ABNORMAL HIGH (ref 1.7–2.4)

## 2018-06-12 LAB — HEPATIC FUNCTION PANEL
ALT: 30 U/L (ref 0–44)
AST: 65 U/L — ABNORMAL HIGH (ref 15–41)
Albumin: 2.9 g/dL — ABNORMAL LOW (ref 3.5–5.0)
Alkaline Phosphatase: 147 U/L — ABNORMAL HIGH (ref 38–126)
BILIRUBIN TOTAL: 1.7 mg/dL — AB (ref 0.3–1.2)
Bilirubin, Direct: 0.9 mg/dL — ABNORMAL HIGH (ref 0.0–0.2)
Indirect Bilirubin: 0.8 mg/dL (ref 0.3–0.9)
Total Protein: 6.1 g/dL — ABNORMAL LOW (ref 6.5–8.1)

## 2018-06-12 LAB — PHOSPHORUS: Phosphorus: 5.1 mg/dL — ABNORMAL HIGH (ref 2.5–4.6)

## 2018-06-12 MED ORDER — PANTOPRAZOLE SODIUM 40 MG PO PACK
40.0000 mg | PACK | Freq: Every day | ORAL | Status: DC
  Administered 2018-06-13 – 2018-06-19 (×7): 40 mg
  Filled 2018-06-12 (×7): qty 20

## 2018-06-12 MED ORDER — AMIODARONE IV BOLUS ONLY 150 MG/100ML
150.0000 mg | Freq: Once | INTRAVENOUS | Status: AC
  Administered 2018-06-12: 18:00:00 150 mg via INTRAVENOUS
  Filled 2018-06-12: qty 100

## 2018-06-12 MED ORDER — MIDAZOLAM HCL 2 MG/2ML IJ SOLN
INTRAMUSCULAR | Status: AC
  Administered 2018-06-12: 2 mg
  Filled 2018-06-12: qty 2

## 2018-06-12 MED ORDER — FUROSEMIDE 10 MG/ML IJ SOLN: 80.0000 mg | Freq: Two times a day (BID) | INTRAMUSCULAR | Status: DC

## 2018-06-12 MED ORDER — PROMETHAZINE HCL 25 MG/ML IJ SOLN
6.2500 mg | INTRAMUSCULAR | Status: DC | PRN
  Administered 2018-06-12 – 2018-06-20 (×2): 6.25 mg via INTRAVENOUS
  Filled 2018-06-12 (×2): qty 1

## 2018-06-12 MED ORDER — MAGNESIUM SULFATE 2 GM/50ML IV SOLN
2.0000 g | Freq: Once | INTRAVENOUS | Status: AC
  Administered 2018-06-12: 2 g via INTRAVENOUS
  Filled 2018-06-12: qty 50

## 2018-06-12 MED ORDER — FUROSEMIDE 10 MG/ML IJ SOLN
40.0000 mg | Freq: Once | INTRAMUSCULAR | Status: AC
  Administered 2018-06-12: 40 mg via INTRAVENOUS
  Filled 2018-06-12: qty 4

## 2018-06-12 MED ORDER — NOREPINEPHRINE 4 MG/250ML-% IV SOLN
INTRAVENOUS | Status: AC
  Administered 2018-06-12: 4 mg via INTRAVENOUS
  Filled 2018-06-12: qty 250

## 2018-06-12 MED ORDER — POTASSIUM CHLORIDE 20 MEQ/15ML (10%) PO SOLN
20.0000 meq | Freq: Every day | ORAL | Status: DC
  Administered 2018-06-13 – 2018-06-14 (×2): 20 meq via ORAL
  Filled 2018-06-12 (×2): qty 15

## 2018-06-12 MED ORDER — TORSEMIDE 20 MG PO TABS
20.0000 mg | ORAL_TABLET | Freq: Every day | ORAL | Status: DC
  Administered 2018-06-12: 10:00:00 20 mg via ORAL
  Filled 2018-06-12: qty 1

## 2018-06-12 MED ORDER — CHLORHEXIDINE GLUCONATE 0.12% ORAL RINSE (MEDLINE KIT)
15.0000 mL | Freq: Two times a day (BID) | OROMUCOSAL | Status: DC
  Administered 2018-06-12 – 2018-06-21 (×18): 15 mL via OROMUCOSAL

## 2018-06-12 MED ORDER — SODIUM CHLORIDE 0.9 % IV SOLN
INTRAVENOUS | Status: DC
  Administered 2018-06-13 – 2018-06-18 (×2): via INTRAVENOUS

## 2018-06-12 MED ORDER — AMIODARONE HCL IN DEXTROSE 360-4.14 MG/200ML-% IV SOLN
30.0000 mg/h | INTRAVENOUS | Status: DC
  Administered 2018-06-12 – 2018-06-14 (×7): 60 mg/h via INTRAVENOUS
  Administered 2018-06-14 – 2018-06-22 (×16): 30 mg/h via INTRAVENOUS
  Filled 2018-06-12 (×14): qty 200
  Filled 2018-06-12: qty 2000
  Filled 2018-06-12 (×9): qty 200

## 2018-06-12 MED ORDER — AMIODARONE HCL IN DEXTROSE 360-4.14 MG/200ML-% IV SOLN
60.0000 mg/h | INTRAVENOUS | Status: AC
  Administered 2018-06-12 (×2): 60 mg/h via INTRAVENOUS
  Filled 2018-06-12 (×2): qty 200

## 2018-06-12 MED ORDER — TRAMADOL HCL 50 MG PO TABS
50.0000 mg | ORAL_TABLET | Freq: Three times a day (TID) | ORAL | Status: DC | PRN
  Administered 2018-06-12 – 2018-06-21 (×3): 50 mg via ORAL
  Filled 2018-06-12 (×3): qty 1

## 2018-06-12 MED ORDER — METOLAZONE 5 MG PO TABS
5.0000 mg | ORAL_TABLET | Freq: Once | ORAL | Status: AC
  Administered 2018-06-12: 5 mg via ORAL
  Filled 2018-06-12 (×2): qty 1

## 2018-06-12 MED ORDER — FUROSEMIDE 10 MG/ML IJ SOLN
120.0000 mg | Freq: Once | INTRAVENOUS | Status: AC
  Administered 2018-06-12: 17:00:00 120 mg via INTRAVENOUS
  Filled 2018-06-12: qty 10

## 2018-06-12 MED ORDER — TAMSULOSIN HCL 0.4 MG PO CAPS
0.4000 mg | ORAL_CAPSULE | Freq: Every day | ORAL | Status: DC
  Administered 2018-06-12 – 2018-06-22 (×11): 0.4 mg via ORAL
  Filled 2018-06-12 (×11): qty 1

## 2018-06-12 MED ORDER — AMIODARONE LOAD VIA INFUSION
150.0000 mg | Freq: Once | INTRAVENOUS | Status: DC
  Filled 2018-06-12: qty 83.34

## 2018-06-12 MED ORDER — LIDOCAINE HCL (CARDIAC) PF 100 MG/5ML IV SOSY
PREFILLED_SYRINGE | INTRAVENOUS | Status: AC
  Administered 2018-06-12: 19:00:00
  Filled 2018-06-12: qty 5

## 2018-06-12 MED ORDER — FENTANYL CITRATE (PF) 100 MCG/2ML IJ SOLN
INTRAMUSCULAR | Status: AC
  Administered 2018-06-12: 50 ug via INTRAVENOUS
  Filled 2018-06-12: qty 2

## 2018-06-12 MED ORDER — NOREPINEPHRINE 4 MG/250ML-% IV SOLN
0.0000 ug/min | INTRAVENOUS | Status: DC
  Administered 2018-06-12: 13 ug/min via INTRAVENOUS
  Administered 2018-06-12: 4 mg via INTRAVENOUS
  Administered 2018-06-13: 18 ug/min via INTRAVENOUS
  Administered 2018-06-13: 10 ug/min via INTRAVENOUS
  Filled 2018-06-12 (×5): qty 250

## 2018-06-12 MED ORDER — ORAL CARE MOUTH RINSE
15.0000 mL | OROMUCOSAL | Status: DC
  Administered 2018-06-13 – 2018-06-19 (×67): 15 mL via OROMUCOSAL

## 2018-06-12 MED ORDER — FENTANYL 2500MCG IN NS 250ML (10MCG/ML) PREMIX INFUSION
25.0000 ug/h | INTRAVENOUS | Status: DC
  Administered 2018-06-12: 50 ug/h via INTRAVENOUS
  Filled 2018-06-12: qty 250

## 2018-06-12 MED ORDER — FENTANYL BOLUS VIA INFUSION
25.0000 ug | INTRAVENOUS | Status: DC | PRN
  Administered 2018-06-15: 25 ug via INTRAVENOUS
  Filled 2018-06-12: qty 25

## 2018-06-12 MED ORDER — AMIODARONE IV BOLUS ONLY 150 MG/100ML
150.0000 mg | Freq: Once | INTRAVENOUS | Status: AC
  Administered 2018-06-12: 05:00:00 150 mg via INTRAVENOUS
  Filled 2018-06-12: qty 100

## 2018-06-12 MED ORDER — FENTANYL CITRATE (PF) 100 MCG/2ML IJ SOLN
50.0000 ug | Freq: Once | INTRAMUSCULAR | Status: AC
  Administered 2018-06-12: 50 ug via INTRAVENOUS

## 2018-06-12 MED ORDER — MIDAZOLAM 50MG/50ML (1MG/ML) PREMIX INFUSION
0.0000 mg/h | INTRAVENOUS | Status: DC
  Administered 2018-06-12: 2 mg/h via INTRAVENOUS
  Administered 2018-06-17: 0.5 mg/h via INTRAVENOUS
  Filled 2018-06-12 (×2): qty 50

## 2018-06-12 MED ORDER — AMIODARONE IV BOLUS ONLY 150 MG/100ML
150.0000 mg | Freq: Once | INTRAVENOUS | Status: AC
  Administered 2018-06-12: 14:00:00 150 mg via INTRAVENOUS
  Filled 2018-06-12: qty 100

## 2018-06-12 MED ORDER — MAGNESIUM SULFATE 2 GM/50ML IV SOLN
INTRAVENOUS | Status: AC
  Administered 2018-06-12: 2 g via INTRAVENOUS
  Filled 2018-06-12: qty 50

## 2018-06-12 MED FILL — Lidocaine HCl Local Inj 1%: INTRAMUSCULAR | Qty: 60 | Status: AC

## 2018-06-12 NOTE — Progress Notes (Addendum)
Pt with sustaining HR ranging from 130's- 150's, BP=114/80; asymptomatic. A. Fib confirmed EKG. Dr Elson Areas informed. Amiodarone gtt started. Will continue to monitor pt.

## 2018-06-12 NOTE — Progress Notes (Signed)
[facility] RN attempted to obtain US IV access in pt's right forearm unsuccessfully. Knowing pt was on blood thinners and prone to bleed beneath skin, RN held pressure on site for 5 full minutes before taping dressing in place. Within 5 mins, pt pointed out that site had bled through gauze dressing and his arm was visibly swollen above dressing. [facility] RN removed dressing and applied another gauze over site with pressure. Area immediately around insertion site ecchymotic and size of quarter; area of swelling without discoloration, soft to touch, and pt verbalized no pain when area palpated. Pt's daughter offered to hold pressure for extended time.

## 2018-06-12 NOTE — Progress Notes (Signed)
RT NOTE:  ETT pulled back 1cm. ETT secured to 23cm/lip

## 2018-06-12 NOTE — CV Procedure (Signed)
Left radial arterial line placement.   Emergent consent assumed. The leftt wrist was prepped and draped in the routine sterile fashion a single lumen radial arterial catheter was placed in the left radial artery using a modified Seldinger technique. Good blood flow and wave forms. A dressing was placed.   Glori Bickers, MD  8:17 PM

## 2018-06-12 NOTE — Progress Notes (Signed)
Patient transported to 2H14 via bed with Zoll defib attached.  Dr [name] and bedside RN also assisting with transport.  CCU staff at bedside to receive patient.

## 2018-06-12 NOTE — Progress Notes (Signed)
[facility] RN paged. Called and spoke with pt's nurse who stated pt has a central line that was placed at an outside facility and asked that we assess it, as well as collect a coax panel. Advised [facility] will be there as soon as able.

## 2018-06-12 NOTE — Progress Notes (Addendum)
Progress Note  Patient Name: Dillon Andrews Date of Encounter: 06/12/2018  Primary Cardiologist: Dillon Hew, MD  AHF: Dr. Haroldine Andrews EP: Dr. Curt Andrews  Subjective   Feels a little bloated, winded last night  Inpatient Medications    Scheduled Meds: . apixaban  5 mg Oral BID  . dronabinol  5 mg Oral BID AC  . gabapentin  400 mg Oral QHS  . magnesium oxide  400 mg Oral QHS  . Melatonin  6 mg Oral QHS  . multivitamin with minerals  1 tablet Oral Daily  . ondansetron  4 mg Oral BID  . pantoprazole  40 mg Oral Daily  . potassium chloride SA  20 mEq Oral Daily  . rosuvastatin  10 mg Oral Daily  . sertraline  25 mg Oral Daily  . Tafamidis Meglumine (Cardiac)  80 mg Oral QPM  . torsemide  20 mg Oral Daily  . traZODone  50 mg Oral QHS  . vitamin B-12  500 mcg Oral QODAY  . zolpidem  5 mg Oral QHS   Continuous Infusions: . amiodarone 60 mg/hr (06/12/18 0739)  . amiodarone    .  ceFAZolin (ANCEF) IV 1 g (06/12/18 0310)  . milrinone 0.125 mcg/kg/min (06/12/18 0700)   PRN Meds: acetaminophen, alum & mag hydroxide-simeth, hydrocortisone, ondansetron (ZOFRAN) IV, traMADol   Vital Signs    Vitals:   06/12/18 0454 06/12/18 0555 06/12/18 0654 06/12/18 0730  BP: 104/77 102/83 105/85 97/78  Pulse:  81 78 (!) 120  Resp: (!) 22 20 (!) 22 (!) 28  Temp:    (!) 97.4 F (36.3 C)  TempSrc:    Oral  SpO2: 95% 96% 93% 95%  Weight:  83.5 kg    Height:        Intake/Output Summary (Last 24 hours) at 06/12/2018 0816 Last data filed at 06/12/2018 0700 Gross per 24 hour  Intake 647.22 ml  Output 500 ml  Net 147.22 ml   Last 3 Weights 06/12/2018 05/17/2018 05/29/2018  Weight (lbs) 184 lb 1.4 oz 182 lb 179 lb 6.4 oz  Weight (kg) 83.5 kg 82.555 kg 81.375 kg      Telemetry    AFib 110's-120's- Personally Reviewed  ECG    Looks an Afib, RBBB, 140bpm  - Personally Reviewed  Physical Exam   GEN: No acute distress.   Neck: No JVD Cardiac: irreg-irreg, tachycardic no murmurs,  rubs, or gallops.  Respiratory: decreased at the bases. GI: Soft, nontender, non-distended  MS: No edema; No deformity. Neuro:  Nonfocal  Psych: Normal affect   L chest, site: dry, no hematoma  Labs    Chemistry Recent Labs  Lab 06/12/18 0557  NA 135  K 4.2  CL 96*  CO2 25  GLUCOSE 140*  BUN 28*  CREATININE 2.06*  CALCIUM 8.9  GFRNONAA 30*  GFRAA 35*  ANIONGAP 14     HematologyNo results for input(s): WBC, RBC, HGB, HCT, MCV, MCH, MCHC, RDW, PLT in the last 168 hours.  Cardiac EnzymesNo results for input(s): TROPONINI in the last 168 hours. No results for input(s): TROPIPOC in the last 168 hours.   BNPNo results for input(s): BNP, PROBNP in the last 168 hours.   DDimer No results for input(s): DDIMER in the last 168 hours.   Radiology    No results found.  Cardiac Studies    05/22/2018: EPS/ablation CONCLUSIONS:   1. Right atrial flutter upon presentation.   2. Successful radiofrequency ablation of atrial flutter along the cavotricuspid isthmus  with complete bidirectional isthmus block achieved.   3. No early apparent complications.    11/10/2017: TEE/DCCV Study Conclusions - Left ventricle: The estimated ejection fraction was 10%. Diffuse   hypokinesis. There was spontaneous echo contrast, indicative of   stasis. No evidence of thrombus. - Aortic valve: No evidence of vegetation. - Mitral valve: No evidence of vegetation. There was mild   regurgitation. - Left atrium: The atrium was moderately dilated. No evidence of   thrombus in the atrial cavity or appendage. No evidence of   thrombus in the atrial cavity or appendage. - Right ventricle: Systolic function was severely reduced. - Right atrium: The atrium was dilated. - Atrial septum: No defect or patent foramen ovale was identified. - Tricuspid valve: No evidence of vegetation. There was moderate   regurgitation. - Pulmonic valve: No evidence of vegetation. Impressions: - Successful cardioversion.  No cardiac source of emboli was   indentified.  Patient Profile     78 y.o. 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 RV lead dislodgement     S/p RV lead revision yesterday     Device check this AM with stable measurements     CXR this AM, reviewed by Dr. Curt Andrews, stable lead position, pulm edema appreciated, pending radiologist official read      Wound care and activity restrictions were reviewed with the patient Post procedure follow up is in place  2. AFlutter, Afib     Known to have a umber of flutters, typical flutter ablated earlier this month     He was cardioverted yesterday during his lead revision procedure, unfortunately back out of  Rhythm last night      CHA2DS2Vasc is 5, on Eliquis, appropriately dosed (no missed doses, including yesterday, leading into his procedure)      Started on Amiodarone gtt last night  3. NICM 4. Chronic CHF combined HF with TTR amyloidosis (wild type)     on home milrinone, continued here     Coox this AM was good     cxr looks a little OL, some SOB/bloating/edema this AM     RVR likely contributing currently     Dillon Andrews give dose of IV lasix and re-visit him later this AM  5. CKD (III)     Creat up this AM     Perhaps RVR as well  Dr. Curt Andrews has seen and examined this patient, d/w Dr. Haroldine Andrews IV lasix dose, continue amio gtt Dillon Andrews revisit later this AM Hopefully if rate/rhythm better, and good response to IV lasix, he may still be able to discharge today       For questions or updates, please contact Dillon Andrews Please consult [email or web address] for contact info under        Signed, Dillon Jamaica, PA-C  06/12/2018, 8:16 AM    I have seen and examined this patient with Dillon Andrews.  Agree with above, note added to reflect my findings.  On exam, tachycardic, no  murmurs, crackles.  Patient is status post RV lead revision for RV lead dislodgment.  He tolerated the procedure well.  He did undergo cardioversion at the time of his procedure.  Unfortunately, he is gone into systolic heart failure overnight.  He does have a history of amyloidosis.  Syncere Kaminski give him IV Lasix and have his heart failure  team evaluate him for further management.  His creatinine is up which appears to be acute on chronic stage III possibly due to his atrial flutter.  He is currently on IV amiodarone.  Benita Boonstra potentially switch him to p.o. amiodarone.  He may require cardioversion.  Javoni Lucken M. Stevin Bielinski MD 06/12/2018 10:38 AM

## 2018-06-12 NOTE — Consult Note (Signed)
NAME:  PAOLA FLYNT, MRN:  [id number], DOB:  22-May-1940, LOS: 0 ADMISSION DATE:  06/10/2018, CONSULTATION DATE:  06/12/2018 REFERRING MD:  Sela Hua, MD, CHIEF COMPLAINT:  Respiratory failure  Brief History   Mr. Carroll is a 78 YO gentleman with history significant for Chronic systolic HF on home milrinone due to TTR amyloidosis, atrial flutter, pace maker placement due to complete heart block with recent RV lead revision, chronic anticoagulation, NASH with esophageal varices  and CKD who is transferred to the CCU from the floor after he developed multiple episodes of rapid VT with hemodynamic instability requiring intubation and cardioversion  History of present illness   Mr. Cotroneo is a 78 YO gentleman with history significant for Chronic systolid HF, atrial flutter, pace maker placement due to complete heart block with recent RV lead revision, chronic anticoagulation and CKD who was admitted on 2/27 to the EP service for RV lead revision. He underwent lead revision on 2/27 with cardioversion during the procedure.   He went back into atrial fibrillation last night and was started on amiodarone drip without much improvement in his arrhythmias. This evening he developed multiple episodes of rapid Ventricular tachycardia associated with hypotension and respiratory extremis. He was transferred to the ICU and intubated and DC cardioverted by cardiology. He was given 125m of lidocaine and started on levophed for hypotension.  History is obtained from chart review as patient is intubated and sedated     Past Medical History  1. AFlutter     H/o ablation, known to have multiple circuits 2. NASH     W/esophageal varicies 3. NICM     Chronic CHF     On home milrinone 4. DM 5. HTN 6. CKD (III)  Significant Hospital Events   -Endotracheal intubation 2/28 -Right IJ central line 2/28 -Left radial A-line 2/28 -DC cardioversion 2/28  Consults:  -PCCM  Procedures:  As  above  Significant Diagnostic Tests:  05/22/2018: EPS/ablation CONCLUSIONS:  1. Right atrial flutter upon presentation.  2. Successful radiofrequency ablation of atrial flutter along the cavotricuspid isthmus with complete bidirectional isthmus block achieved.  3. No early apparent complications.   Echo 12.31/2019 - Left ventricle: The cavity size was normal. Wall thickness was   increased in a pattern of severe LVH. Systolic function was   moderately reduced. The estimated ejection fraction was in the   range of 35% to 40%. Diffuse hypokinesis. Doppler parameters are   consistent with a reversible restrictive pattern, indicative of   decreased left ventricular diastolic compliance and/or increased   left atrial pressure (grade 3 diastolic dysfunction). - Ventricular septum: The contour showed diastolic flattening. - Aortic valve: Trileaflet; mildly thickened, mildly calcified   leaflets. - Mitral valve: There was mild regurgitation. - Left atrium: The atrium was moderately dilated. - Right ventricle: The cavity size was moderately dilated. Wall   thickness was normal. - Right atrium: The atrium was moderately dilated. - Pulmonary arteries: Systolic pressure was moderately to severely   increased. PA peak pressure: 68 mm Hg (S).    11/10/2017: TEE/DCCV Study Conclusions - Left ventricle: The estimated ejection fraction was 10%. Diffuse hypokinesis. There was spontaneous echo contrast, indicative of stasis. No evidence of thrombus. - Aortic valve: No evidence of vegetation. - Mitral valve: No evidence of vegetation. There was mild regurgitation. - Left atrium: The atrium was moderately dilated. No evidence of thrombus in the atrial cavity or appendage. No evidence of thrombus in the atrial cavity or appendage. - Right  ventricle: Systolic function was severely reduced. - Right atrium: The atrium was dilated. - Atrial septum: No defect or patent foramen ovale was  identified. - Tricuspid valve: No evidence of vegetation. There was moderate regurgitation. - Pulmonic valve: No evidence of vegetation. Impressions: - Successful cardioversion. No cardiac source of emboli was indentified.  Micro Data:  -None   Antimicrobials:  -None   Objective   Blood pressure (!) 87/65, pulse 86, temperature (!) 97.5 F (36.4 C), temperature source Oral, resp. rate 16, height 5' 10"  (1.778 m), weight 83.5 kg, SpO2 94 %.    FiO2 (%):  [100 %] 100 % Set Rate:  [18 bmp] 18 bmp Vt Set:  [500 mL] 500 mL PEEP:  [5 cmH20] 5 cmH20   Intake/Output Summary (Last 24 hours) at 06/12/2018 2012 Last data filed at 06/12/2018 1500 Gross per 24 hour  Intake 401.28 ml  Output 650 ml  Net -248.72 ml   Filed Weights   06/10/2018 1226 06/12/18 0555  Weight: 82.6 kg 83.5 kg    Examination: General: Intubated, sedated, has elevated elevated JVP HENT: Oral ET tube and OG Lungs: Clear to auscultation anteriorly Cardiovascular: Paced rhythm, S2, S2 heard normal Abdomen: Soft, NO organomegaly, bowel sounds present Extremities: Bilateral LE edema to the Knees  Neuro: Sedated on midazolam and fentanyl, RASS -4 GU: Foley in situ   Assessment & Plan:  Mr. Holcomb is a 78 YO gentleman with history significant for Chronic systolic HF on home milrinone due to TTR amyloidosis, atrial flutter, pace maker placement due to complete heart block with recent RV lead revision, chronic anticoagulation, NASH with esophageal varices  and CKD who is transferred to the CCU from the floor after he developed multiple episodes of rapid VT with hemodynamic instability requiring intubation and cardioversion  #Acute hypoxic respiratory failure Patient developed respiratory distress in the setting of rapid ventricular tachycardia requiring intubation and mechanical ventilation. CXR with bilateral infiltrates suspicious of pulmonary edema -Lung protective ventilation strategy -ABG obtained. Wean  FIO2 -Daily SAT  -Evaluate daily for SBT and appropriateness for extubation -VAP prevention bundle  #Shock Undifferentiated shock but suspect likely cardiogenic precipitated by arrhythmias. He is volume overloaded on exam and has had no signs or symptoms concerning for infection -Will hold off antibiotics for now -Obtain CBC -If he develops fever or other signs concerning for infection will obtain culture and start antibiotics -Continue levophed and titrate to MAP of 65 -Check ScVO2  #Multiple arrhythmias DC cardioverted tonight due to hemodynamic instability -Continue amiodarone gtt -Check electrolytes and replete as appropriate -Other management as per cardiology  #Acute on chronic systolic and diastolic Heart failure #TTR amyloid Patient with systolic and diastolic HF. He is volume overloaded on exam. -Diuresis as per cardiology -Daily weights -Strict in and out monitoring -He is on home inotrope, management as per cardiology  #Acute on chronic Kidney disease  Suspect likely due to venous congestion in the setting of volume overload -Daily BMP -Diuresis as per cardiology -Avoid nephrotoxic medications -Renally dose all medications    Best practice:  Diet: Nutrition consult to initiate tube feeds  Pain/Anxiety/Delirium protocol (if indicated): Fentanyl, Midazolam VAP protocol (if indicated): HoB elevated, PPI, Oral care  DVT prophylaxis: Eliquis GI prophylaxis: Pantoprazole  Glucose control: Maintain euglycemia  Mobility: PT and OT to evaluate and treat as appropriate  Code Status: Full code  Family Communication: Family updated  Disposition: Admit ICU   Labs   CBC: No results for input(s): WBC, NEUTROABS, HGB, HCT, MCV,  PLT in the last 168 hours.  Basic Metabolic Panel: Recent Labs  Lab 06/12/18 0557  NA 135  K 4.2  CL 96*  CO2 25  GLUCOSE 140*  BUN 28*  CREATININE 2.06*  CALCIUM 8.9  MG 1.9    ABG    Component Value Date/Time   PHART 7.465 (H)  11/25/2016 2115   PCO2ART 45.7 11/25/2016 2115   PO2ART 80.7 (L) 11/25/2016 2115   HCO3 32.5 (H) 11/25/2016 2115   TCO2 25 06/30/2008 2345   O2SAT 75.9 06/12/2018 1650     HbA1C: Hgb A1c MFr Bld  Date/Time Value Ref Range Status  12/12/2017 12:13 PM 6.7 (H) 4.6 - 6.5 % Final    Comment:    Glycemic Control Guidelines for People with Diabetes:Non Diabetic:  <6%Goal of Therapy: <7%Additional Action Suggested:  >8%   11/05/2017 06:58 PM 7.5 (H) 4.8 - 5.6 % Final    Comment:    (NOTE) Pre diabetes:          5.7%-6.4% Diabetes:              >6.4% Glycemic control for   <7.0% adults with diabetes     CBG: Recent Labs  Lab 05/25/2018 1231 05/22/2018 1732 05/19/2018 2127 06/12/18 0645 06/12/18 1305  GLUCAP 113* 92 185* 130* 162*    Review of Systems:   Unable to obtain as patient is intubated and sedated  Past Medical History  He,  has a past medical history of Anemia (11/2016), Arthritis, Atrial tachycardia ([place]), Barrett's esophagus, Basal cell carcinoma, Chicken pox, Cholelithiasis (11/2016), Chronic combined systolic and diastolic CHF (congestive heart failure) ([place]), CKD (chronic kidney disease), stage III ([place]), Esophageal varices ([place]), Family history of adverse reaction to anesthesia, GERD (gastroesophageal reflux disease), Goals of care, counseling/discussion (10/22/2017), High cholesterol, Hypertension, Kappa light chain myeloma ([place]) (10/22/2017), Mitral regurgitation, NAFLD (nonalcoholic fatty liver disease), NICM (nonischemic cardiomyopathy) ([place]), NSVT (nonsustained ventricular tachycardia) ([place]), Pneumonia (09/2016), Presence of permanent cardiac pacemaker (01/08/2018), RBBB, Tricuspid regurgitation, and Type II diabetes mellitus ([place]).   Surgical History    Past Surgical History:  Procedure Laterality Date  . A-FLUTTER ABLATION N/A 05/22/2018   Procedure: A-FLUTTER ABLATION;  Surgeon: Constance Haw, MD;  Location: [facility];  Service: Cardiovascular;   Laterality: N/A;  . BACK SURGERY    . BASAL CELL CARCINOMA EXCISION Right    temple; "had to do skin graft"  . CARDIAC CATHETERIZATION  09/2016   Minimal, non-occlusive CAD, EF 45% [facility])  . CARDIOVERSION N/A 11/10/2017   Procedure: CARDIOVERSION;  Surgeon: Jolaine Artist, MD;  Location: [facility];  Service: Cardiovascular;  Laterality: N/A;  . CARDIOVERSION N/A 11/14/2017   Procedure: CARDIOVERSION;  Surgeon: Jolaine Artist, MD;  Location: [facility];  Service: Cardiovascular;  Laterality: N/A;  . CARDIOVERSION N/A 12/19/2017   Procedure: CARDIOVERSION;  Surgeon: Jolaine Artist, MD;  Location: [facility] ENDOSCOPY;  Service: Cardiovascular;  Laterality: N/A;  . CARDIOVERSION N/A 05/14/2018   Procedure: CARDIOVERSION;  Surgeon: Jolaine Artist, MD;  Location: [place];  Service: Cardiovascular;  Laterality: N/A;  . CARPAL TUNNEL RELEASE Bilateral   . CATARACT EXTRACTION W/ INTRAOCULAR LENS  IMPLANT, BILATERAL Bilateral   . CHOLECYSTECTOMY N/A 11/22/2016   Procedure: LAPAROSCOPIC CHOLECYSTECTOMY;  Surgeon: Coralie Keens, MD;  Location: [place];  Service: General;  Laterality: N/A;  . IR FLUORO GUIDE CV LINE RIGHT  11/26/2017  . IR US GUIDE VASC ACCESS RIGHT  11/26/2017  . JOINT REPLACEMENT    .  LEAD REVISION/REPAIR N/A 06/04/2018   Procedure: RV lead;  Surgeon: Constance Haw, MD;  Location: [place] [facility];  Service: Cardiovascular;  Laterality: N/A;  . LUMBAR DISC SURGERY  X 2  . PACEMAKER IMPLANT N/A 01/08/2018   Procedure: PACEMAKER IMPLANT;  Surgeon: Constance Haw, MD;  Location: [place] [facility];  Service: Cardiovascular;  Laterality: N/A;  . TEE WITHOUT CARDIOVERSION N/A 11/10/2017   Procedure: TRANSESOPHAGEAL ECHOCARDIOGRAM (TEE);  Surgeon: Jolaine Artist, MD;  Location: [facility] ENDOSCOPY;  Service: Cardiovascular;  Laterality: N/A;  . TONSILLECTOMY AND ADENOIDECTOMY    . TOTAL KNEE ARTHROPLASTY Left 2010  . TRANSESOPHAGEAL  ECHOCARDIOGRAM  09/2016   Mild global LV hypokinesis. Moderate MR. Moderate TR. [facility])  . TRANSTHORACIC ECHOCARDIOGRAM  10/2016   [facility]) EF 35-40% with moderate HK. Normal RV function. Moderately severe TR.  Marland Kitchen TRANSTHORACIC ECHOCARDIOGRAM  03/2017; 06/2017    A) ([place]) mild concentric LVH. EF of 40-45%. Diffuse HK with no RWMA. GR 2 DD. Septal dyssynergy. Mild MR, moderate TR. Severe RA dilation..;; B) Severe LVH.  EF 25 to 30% with mild HK of Septal wall.  Mod-severely dilated RA & Mod dilated RV.     Social History   reports that he quit smoking about 41 years ago. His smoking use included cigarettes. He has a 4.50 pack-year smoking history. He has never used smokeless tobacco. He reports current alcohol use. He reports that he does not use drugs.   Family History   His family history includes Alcohol abuse in his brother; Autoimmune disease in his daughter; Breast cancer in his sister; Cancer in his father; Cataracts in his father; Heart failure in his mother; Liver disease in his brother. There is no history of Cirrhosis.   Allergies No Known Allergies   Home Medications  Prior to Admission medications   Medication Sig Start Date End Date Taking? Authorizing Provider  apixaban (ELIQUIS) 5 MG TABS tablet Take 1 tablet (5 mg total) by mouth 2 (two) times daily. 05/29/18  Yes Camnitz, Will Hassell Done, MD  dronabinol (MARINOL) 5 MG capsule Take 5 mg by mouth 2 (two) times daily before a meal.   Yes [provider]  gabapentin (NEURONTIN) 800 MG tablet Take 400 mg by mouth daily.   Yes [provider]  Magnesium 500 MG TABS Take 500 mg by mouth at bedtime.   Yes [provider]  Melatonin 5 MG TABS Take 5 mg by mouth at bedtime.    Yes [provider]  milrinone (PRIMACOR) 20 MG/100 ML SOLN infusion Inject 0.0099 mg/min into the vein continuous. 11/27/17  Yes Shirley Friar, PA-C  Multiple Vitamin (MULTIVITAMIN WITH  MINERALS) TABS tablet Take 1 tablet by mouth daily.   Yes [provider]  omeprazole (PRILOSEC) 40 MG capsule TAKE 1 CAPSULE(40 MG) BY MOUTH DAILY Patient taking differently: Take 40 mg by mouth daily.  02/11/18  Yes [name], MD  ondansetron (ZOFRAN) 4 MG tablet TAKE 1 TABLET(4 MG) BY MOUTH EVERY 8 HOURS AS NEEDED FOR NAUSEA OR VOMITING Patient taking differently: Take 4 mg by mouth 2 (two) times daily.  05/06/18  Yes [name], MD  potassium chloride SA (K-DUR,KLOR-CON) 20 MEQ tablet Take 20 mEq by mouth See admin instructions. Take 20 meq every morning may increase to 40 meq once daily as needed for excessive weight gain / swelling (take with 40 meq with 40 mg of torsemide)   Yes [provider]  rosuvastatin (CRESTOR) 10 MG tablet TAKE 1 TABLET BY MOUTH ONCE DAILY Patient taking differently: Take 10 mg by mouth daily.  02/09/18  Yes [name], MD  sertraline (ZOLOFT) 25 MG tablet TAKE 1 TABLET BY MOUTH EVERY DAY Patient taking differently: Take 25 mg by mouth daily.  04/28/18  Yes Bensimhon, Shaune Pascal, MD  torsemide (DEMADEX) 20 MG tablet Take 1 tablet (20 mg total) by mouth daily. Can take 20 mg extra as needed. Patient taking differently: Take 20-40 mg by mouth See admin instructions. Take 20 mg daily, may take an 40 mg instead if there is excessive weight gain/swelling 11/27/17  Yes Tillery, Satira Mccallum, PA-C  traZODone (DESYREL) 50 MG tablet TAKE 1 TABLET(50 MG) BY MOUTH AT BEDTIME Patient taking differently: Take 50 mg by mouth at bedtime.  02/09/18  Yes [name], MD  vitamin B-12 (CYANOCOBALAMIN) 500 MCG tablet Take 500 mcg by mouth every other day.   Yes [provider]  zolpidem (AMBIEN) 5 MG tablet TAKE 1 TABLET(5 MG) BY MOUTH AT BEDTIME Patient taking differently: Take 5 mg by mouth at bedtime.  05/18/18  Yes [name], MD  glucose blood test strip Use as instructed 12/01/17   [name], MD  hydrocortisone (ANUSOL-HC) 25 MG suppository Place 25 mg rectally 2 (two) times daily as needed for hemorrhoids or anal itching.    [provider]  polyethylene glycol (MIRALAX / GLYCOLAX) packet Take 17 g by mouth daily. Patient taking differently: Take 17 g by mouth daily as needed for moderate constipation.  11/28/17   Nita Sells, MD  VYNDAQEL 20 MG CAPS Take 80 mg by mouth daily. (4 capsules) Patient taking differently: Take 80 mg by mouth every evening.  11/27/17   Shirley Friar, PA-C     Critical care time: The patient is critically ill with multiple organ systems failure and requires high complexity decision making for assessment and support, frequent evaluation and titration of therapies, application of advanced monitoring technologies and extensive interpretation of multiple databases.   Critical Care Time devoted to patient care services described in this note is  33 Minutes. This time reflects time of care of this signee Dr. Oswald Hillock. This critical care time does not reflect procedure time, or teaching time or supervisory time of PA/NP/Med student/Med Resident etc but could involve care discussion time.  Oswald Hillock, M.D. [facility] Pulmonary/Critical Care Medicine. Pager:  After hours pager: [phone].

## 2018-06-12 NOTE — CV Procedure (Signed)
RIJ central line procedure    Emergent consent. The right neck was prepped and draped in the routine sterile fashion and anesthetized with 1% local lidocaine.   Using u/s guidance and full sterile precautions. A triple lumen venous catheter was placed in the right internal jugular vein using a modified Seldinger technique. There were no immediate complications. CXR confirmed placement. No PTX.   Glori Bickers, MD  8:15 PM

## 2018-06-12 NOTE — CV Procedure (Signed)
    DIRECT CURRENT CARDIOVERSION  NAME:  Dillon Andrews   MRN: [id number] DOB:  May 15, 1940   ADMIT DATE: 05/20/2018   INDICATIONS: Atrial f    PROCEDURE:   Procedure done emergently. Patient already sedated on vent. Once an appropriate time out was taken, the patient received a single biphasic, synchronized 150J shock with prompt conversion to sinus rhythm. No apparent complications.  Glori Bickers, MD  8:10 PM

## 2018-06-12 NOTE — Procedures (Signed)
Intubation Procedure Note Dillon Andrews  [id number]  [birth year]    Procedure: Intubation Indications: Airway protection and maintenance   Procedure Details Consent: emergent Time Out: Performed   Maximum clean technique was used including gloves, hand hygiene and mask  Laryngoscopy equipment used: MAC 3 Glidescope   Medications: Fentanyl 50 mcg, Versed 1 mg, Etomidate 20 mg, Rocuronium 50 mg   Grade 3 airway view   Evaluation Hemodynamic Status: BP stable throughout, stable throughout Patient's Current Condition: stable Patient did tolerate procedure well Chest X-ray ordered to verify placement.  pending  Georgann Housekeeper, AGACNP-BC [place] Pager [phone] or [phone]  06/12/2018 7:09 PM

## 2018-06-12 NOTE — Consult Note (Addendum)
Advanced Heart Failure Team Consult Note   Primary Physician: [name], MD PCP-Cardiologist:  Glenetta Hew, MD  Reason for Consultation: A/C systolic HF  HPI:    Dillon Andrews is seen today for evaluation of A/C systolic HF at the request of Dr Curt Bears.   Dillon Andrews is a 78 y.o. malewith a hx of chronic combined CHF due to NICM, CKD III,presumedNASH(per pt, never proven),esophageal varices per chart(prior glass of wine occasionally but no heavy alcohol),HTN, HLD, DM, Barrett's esophagus, GERD, anemia, RBBB  He was admitted 3/13-3/17/19 for A/C combined HF. While inpatient, SPEP and UPEP sent for concerns for cardiac amyloidosis. He diuresed with IV lasix and was transitioned to lasix 80 mg q am, 40 mg q pm. DC weight 180 lbs.   NICM by cath at [facility] 09/2016 with EF 40-45%. Echo 06/2017: EF 25-30%,NICM by cath at [facility] 09/2016 with EF 40-45%. Echo 06/2017: EF 25-30%  His SPEP came back with m-spike 0.8% and he was referred to Dr Beryle Beams. UPEP with Bence Jones Protein positive; kappa type.   PYP scan completed on 3/20 strongly suggestive of transthyretin amyloidosis. Genetic testing negative.  Admitted 3/28-3/31 with URI and A/C HF. URI treated with antibiotics. Diuresed with IV lasix, then transitioned to torsemide 40 mg BID. Genetic testing sent.   Admitted 2/97-9/89/21 with A/C systolic HF. He was diuresed with IV lasix. He required milrinone for low output. He was in afib RVR and was cardioverted twice. He was treated for sepsis. He had AKI on CKD that resolved prior to discharge. He also had rectal bleeding and required 1 unit RBCs. Eliquis was held and then dose was reduced. Palliative met with him and he was made DNR/I. Discharged home with milrinone.  Admitted 8/20-8/22/19 with fever and presumed HCAP. PCT was negative. He did not need abx at discharge. HF team consulted. He was stable from HF perspective. Continued milrinone, torsemide, and  tafamidis. He was back in rate controlled atrial flutter, but was asymptomatic. DC weight 174 lbs.   Admitted 01/06/18 after he had syncopal event. CT of head was negative for bleed.EKG showed complete heart block.Amiodarone was stopped.EP consulted and on 9/26 he underwent Medtronic PPM.Eliquis was restarted on 01/09/2018. Discharge weight 175 pounds.    Last seen in HF clinic 05/13/18. He was volume overloaded and was instructed to double torsemide for a few days. Found to be back in atrial flutter. Set up for DCCV, which was successful on 1/30. He saw Dr Curt Bears on 2/5 and planned for ablation.   Underwent atrial flutter ablation on 05/22/18. Saw Dr Curt Bears again on 2/14 and RV threshold was elevated, so he was set up for RV lead revision.   Over the last week, he has felt more SOB with orthopnea, PND, and BLE edema. He has taken extra torsemide up to 60 mg and had little UOP. Poor appetite and energy level.   He was admitted yesterday for RV lead revision. Routine post-op CXR this am showed volume overload, so AHF team consulted.   Pertinent admission labs include (this morning's labs): Creatinine 2.06, K 4.2, mag 1.9.  CXR: CHF and left sided effusion.   On amiodarone for recurrent AF. Remains on milrinone 0.125 mcg/kg/min. Weight down 6 lbs from last clinic weight. He feels like the BLE edema has gone down since yesterday, but otherwise feels the same. Unable to sleep due to orthopnea and PND.   He was given 40 mg IV lasix + 20 mg torsemide  this am. Having difficulty urinating. Bladder scan shows 300 mls. Only 250 cc charted so far today.   Echo 12/19 EF 35-40%, RV mod dilated, normal function  Review of Systems: [y] = yes, [ ]  = no   . General: Weight gain [ ] ; Weight loss [ ] ; Anorexia Blue.Reese ]; Fatigue Blue.Reese ]; Fever [ ] ; Chills [ ] ; Weakness Blue.Reese ]  . Cardiac: Chest pain/pressure [ ] ; Resting SOB [ ] ; Exertional SOB Blue.Reese ]; Orthopnea Blue.Reese ]; Pedal Edema Blue.Reese ]; Palpitations [ ] ; Syncope [ ] ;  Presyncope [ ] ; Paroxysmal nocturnal dyspnea[y ]  . Pulmonary: Cough [ ] ; Wheezing[ ] ; Hemoptysis[ ] ; Sputum [ ] ; Snoring [ ]   . GI: Vomiting[ ] ; Dysphagia[ ] ; Melena[ ] ; Hematochezia [ ] ; Heartburn[ ] ; Abdominal pain [ ] ; Constipation [ ] ; Diarrhea [ ] ; BRBPR [ ]   . GU: Hematuria[ ] ; Dysuria [ ] ; Nocturia[ ]   . Vascular: Pain in legs with walking [ ] ; Pain in feet with lying flat [ ] ; Non-healing sores [ ] ; Stroke [ ] ; TIA [ ] ; Slurred speech [ ] ;  . Neuro: Headaches[ ] ; Vertigo[ ] ; Seizures[ ] ; Paresthesias[ ] ;Blurred vision [ ] ; Diplopia [ ] ; Vision changes [ ]   . Ortho/Skin: Arthritis Blue.Reese ]; Joint pain Blue.Reese ]; Muscle pain [ ] ; Joint swelling [ ] ; Back Pain [ ] ; Rash [ ]   . Psych: Depression[ ] ; Anxiety[ ]   . Heme: Bleeding problems [ ] ; Clotting disorders [ ] ; Anemia [ ]   . Endocrine: Diabetes [ ] ; Thyroid dysfunction[ ]   Home Medications Prior to Admission medications   Medication Sig Start Date End Date Taking? Authorizing Provider  apixaban (ELIQUIS) 5 MG TABS tablet Take 1 tablet (5 mg total) by mouth 2 (two) times daily. 05/29/18  Yes Camnitz, Will Hassell Done, MD  dronabinol (MARINOL) 5 MG capsule Take 5 mg by mouth 2 (two) times daily before a meal.   Yes [provider]  gabapentin (NEURONTIN) 800 MG tablet Take 400 mg by mouth daily.   Yes [provider]  Magnesium 500 MG TABS Take 500 mg by mouth at bedtime.   Yes [provider]  Melatonin 5 MG TABS Take 5 mg by mouth at bedtime.    Yes [provider]  milrinone (PRIMACOR) 20 MG/100 ML SOLN infusion Inject 0.0099 mg/min into the vein continuous. 11/27/17  Yes Shirley Friar, PA-C  Multiple Vitamin (MULTIVITAMIN WITH MINERALS) TABS tablet Take 1 tablet by mouth daily.   Yes [provider]  omeprazole (PRILOSEC) 40 MG capsule TAKE 1 CAPSULE(40 MG) BY MOUTH DAILY Patient taking differently: Take 40 mg by mouth daily.  02/11/18  Yes [name], MD  ondansetron (ZOFRAN) 4 MG  tablet TAKE 1 TABLET(4 MG) BY MOUTH EVERY 8 HOURS AS NEEDED FOR NAUSEA OR VOMITING Patient taking differently: Take 4 mg by mouth 2 (two) times daily.  05/06/18  Yes [name], MD  potassium chloride SA (K-DUR,KLOR-CON) 20 MEQ tablet Take 20 mEq by mouth See admin instructions. Take 20 meq every morning may increase to 40 meq once daily as needed for excessive weight gain / swelling (take with 40 meq with 40 mg of torsemide)   Yes [provider]  rosuvastatin (CRESTOR) 10 MG tablet TAKE 1 TABLET BY MOUTH ONCE DAILY Patient taking differently: Take 10 mg by mouth daily.  02/09/18  Yes [name], MD  sertraline (ZOLOFT) 25 MG tablet TAKE 1 TABLET BY MOUTH EVERY DAY Patient taking differently: Take 25 mg  by mouth daily.  04/28/18  Yes Jocelyn Nold, Shaune Pascal, MD  torsemide (DEMADEX) 20 MG tablet Take 1 tablet (20 mg total) by mouth daily. Can take 20 mg extra as needed. Patient taking differently: Take 20-40 mg by mouth See admin instructions. Take 20 mg daily, may take an 40 mg instead if there is excessive weight gain/swelling 11/27/17  Yes Tillery, Satira Mccallum, PA-C  traZODone (DESYREL) 50 MG tablet TAKE 1 TABLET(50 MG) BY MOUTH AT BEDTIME Patient taking differently: Take 50 mg by mouth at bedtime.  02/09/18  Yes [name], MD  vitamin B-12 (CYANOCOBALAMIN) 500 MCG tablet Take 500 mcg by mouth every other day.   Yes [provider]  zolpidem (AMBIEN) 5 MG tablet TAKE 1 TABLET(5 MG) BY MOUTH AT BEDTIME Patient taking differently: Take 5 mg by mouth at bedtime.  05/18/18  Yes [name], MD  glucose blood test strip Use as instructed 12/01/17   [name], MD  hydrocortisone (ANUSOL-HC) 25 MG suppository Place 25 mg rectally 2 (two) times daily as needed for hemorrhoids or anal itching.    [provider]  polyethylene glycol (MIRALAX / GLYCOLAX) packet Take 17 g by mouth daily. Patient taking differently: Take 17 g by  mouth daily as needed for moderate constipation.  11/28/17   Nita Sells, MD  VYNDAQEL 20 MG CAPS Take 80 mg by mouth daily. (4 capsules) Patient taking differently: Take 80 mg by mouth every evening.  11/27/17   Shirley Friar, PA-C    Past Medical History: Past Medical History:  Diagnosis Date  . Anemia 11/2016  . Arthritis    "some; hands/fingers" (06/25/2017)  . Atrial tachycardia ([place])   . Barrett's esophagus   . Basal cell carcinoma    "face, arms, hands" (06/25/2017)  . Chicken pox   . Cholelithiasis 11/2016   S/P LAP CHOLE  . Chronic combined systolic and diastolic CHF (congestive heart failure) ([place])   . CKD (chronic kidney disease), stage III ([place])   . Esophageal varices (HCC)    Secondary to NAFLD  . Family history of adverse reaction to anesthesia    "daughter's sat drop very low; last OR had to be reintubated" (06/25/2017)  . GERD (gastroesophageal reflux disease)   . Goals of care, counseling/discussion 10/22/2017  . High cholesterol   . Hypertension   . Kappa light chain myeloma ([place]) 10/22/2017  . Mitral regurgitation   . NAFLD (nonalcoholic fatty liver disease)   . NICM (nonischemic cardiomyopathy) ([place])    Echo 06/2017: Severe LVH.  EF 25 to 30% with mild HK of the baseline after septal wall.  Moderate to severely dilated right atrium, moderately dilated RV. (Concern fr ? amyloidosis).  . NSVT (nonsustained ventricular tachycardia) ([place])   . Pneumonia 09/2016  . Presence of permanent cardiac pacemaker 01/08/2018  . RBBB   . Tricuspid regurgitation   . Type II diabetes mellitus ([place])     Past Surgical History: Past Surgical History:  Procedure Laterality Date  . A-FLUTTER ABLATION N/A 05/22/2018   Procedure: A-FLUTTER ABLATION;  Surgeon: Constance Haw, MD;  Location: [place] CV LAB;  Service: Cardiovascular;  Laterality: N/A;  . BACK SURGERY    . BASAL CELL CARCINOMA EXCISION Right    temple; "had to do skin graft"  . CARDIAC  CATHETERIZATION  09/2016   Minimal, non-occlusive CAD, EF 45% [facility])  . CARDIOVERSION N/A 11/10/2017   Procedure: CARDIOVERSION;  Surgeon: Jolaine Artist, MD;  Location:  [place] ENDOSCOPY;  Service: Cardiovascular;  Laterality: N/A;  . CARDIOVERSION N/A 11/14/2017   Procedure: CARDIOVERSION;  Surgeon: Jolaine Artist, MD;  Location: [facility] ENDOSCOPY;  Service: Cardiovascular;  Laterality: N/A;  . CARDIOVERSION N/A 12/19/2017   Procedure: CARDIOVERSION;  Surgeon: Jolaine Artist, MD;  Location: [facility] ENDOSCOPY;  Service: Cardiovascular;  Laterality: N/A;  . CARDIOVERSION N/A 05/14/2018   Procedure: CARDIOVERSION;  Surgeon: Jolaine Artist, MD;  Location: [facility] ENDOSCOPY;  Service: Cardiovascular;  Laterality: N/A;  . CARPAL TUNNEL RELEASE Bilateral   . CATARACT EXTRACTION W/ INTRAOCULAR LENS  IMPLANT, BILATERAL Bilateral   . CHOLECYSTECTOMY N/A 11/22/2016   Procedure: LAPAROSCOPIC CHOLECYSTECTOMY;  Surgeon: Coralie Keens, MD;  Location: [place];  Service: General;  Laterality: N/A;  . IR FLUORO GUIDE CV LINE RIGHT  11/26/2017  . IR US GUIDE VASC ACCESS RIGHT  11/26/2017  . JOINT REPLACEMENT    . LEAD REVISION/REPAIR N/A 06/04/2018   Procedure: RV lead;  Surgeon: Constance Haw, MD;  Location: [place] CV LAB;  Service: Cardiovascular;  Laterality: N/A;  . LUMBAR DISC SURGERY  X 2  . PACEMAKER IMPLANT N/A 01/08/2018   Procedure: PACEMAKER IMPLANT;  Surgeon: Constance Haw, MD;  Location: [place] CV LAB;  Service: Cardiovascular;  Laterality: N/A;  . TEE WITHOUT CARDIOVERSION N/A 11/10/2017   Procedure: TRANSESOPHAGEAL ECHOCARDIOGRAM (TEE);  Surgeon: Jolaine Artist, MD;  Location: [facility] ENDOSCOPY;  Service: Cardiovascular;  Laterality: N/A;  . TONSILLECTOMY AND ADENOIDECTOMY    . TOTAL KNEE ARTHROPLASTY Left 2010  . TRANSESOPHAGEAL ECHOCARDIOGRAM  09/2016   Mild global LV hypokinesis. Moderate MR. Moderate TR. [facility])  . TRANSTHORACIC ECHOCARDIOGRAM   10/2016   [facility]) EF 35-40% with moderate HK. Normal RV function. Moderately severe TR.  Marland Kitchen TRANSTHORACIC ECHOCARDIOGRAM  03/2017; 06/2017    A) ([place]) mild concentric LVH. EF of 40-45%. Diffuse HK with no RWMA. GR 2 DD. Septal dyssynergy. Mild MR, moderate TR. Severe RA dilation..;; B) Severe LVH.  EF 25 to 30% with mild HK of Septal wall.  Mod-severely dilated RA & Mod dilated RV.    Family History: Family History  Problem Relation Age of Onset  . Heart failure Mother   . Cancer Father   . Cataracts Father   . Breast cancer Sister   . Alcohol abuse Brother   . Liver disease Brother   . Autoimmune disease Daughter        Bechet's syndrome  . Cirrhosis Neg Hx     Social History: Social History   Socioeconomic History  . Marital status: Married    Spouse name: Not on file  . Number of children: 4  . Years of education: 62  . Highest education level: Not on file  Occupational History  . Occupation: retired  Scientific laboratory technician  . Financial resource strain: Not on file  . Food insecurity:    Worry: Not on file    Inability: Not on file  . Transportation needs:    Medical: Not on file    Non-medical: Not on file  Tobacco Use  . Smoking status: Former Smoker    Packs/day: 0.30    Years: 15.00    Pack years: 4.50    Types: Cigarettes    Last attempt to quit: 03/17/1977    Years since quitting: 41.2  . Smokeless tobacco: Never Used  Substance and Sexual Activity  . Alcohol use: Yes    Comment: 06/25/2017 "nothing since 09/2016;  used to have  glass of wine q hs"  . Drug use: No  . Sexual activity: Not on file  Lifestyle  . Physical activity:    Days per week: Not on file    Minutes per session: Not on file  . Stress: Not on file  Relationships  . Social connections:    Talks on phone: Not on file    Gets together: Not on file    Attends religious service: Not on file    Active member of club or organization: Not on file    Attends meetings of clubs or  organizations: Not on file    Relationship status: Not on file  Other Topics Concern  . Not on file  Social History Narrative   He is retired from [employer].  Previously worked for [employer] by his daughter --> also a patient of Dr. Ellyn Hack   Lives with wife in a one story home.    Education: high school.     Allergies:  No Known Allergies  Objective:    Vital Signs:   Temp:  [97.4 F (36.3 C)-98.7 F (37.1 C)] 97.4 F (36.3 C) (02/28 0730) Pulse Rate:  [47-246] 120 (02/28 0730) Resp:  [8-82] 28 (02/28 0730) BP: (94-164)/(52-136) 97/78 (02/28 0730) SpO2:  [88 %-100 %] 95 % (02/28 0730) Weight:  [82.6 kg-83.5 kg] 83.5 kg (02/28 0555) Last BM Date: 05/22/2018  Weight change: Filed Weights   06/06/2018 1226 06/12/18 0555  Weight: 82.6 kg 83.5 kg    Intake/Output:   Intake/Output Summary (Last 24 hours) at 06/12/2018 1020 Last data filed at 06/12/2018 0700 Gross per 24 hour  Intake 647.22 ml  Output 500 ml  Net 147.22 ml      Physical Exam    General:  Elderly.  No resp difficulty HEENT: normal Neck: supple. JVP to jaw. Carotids 2+ bilat; no bruits. No lymphadenopathy or thyromegaly appreciated. Cor: PMI nondisplaced. Irregular, tachycardic. No rubs, gallops or murmurs. Rt chest tunneled PICC Lungs: diminished in LLL.  Abdomen: soft, nontender, nondistended. No hepatosplenomegaly. No bruits or masses. Good bowel sounds. Extremities: no cyanosis, clubbing, rash, BLE 2+ edema Neuro: alert & orientedx3, cranial nerves grossly intact. moves all 4 extremities w/o difficulty. Affect pleasant   Telemetry   Afib with RBBB 100s  EKG    Afib with RBBB 140 bpm.   Labs   Basic Metabolic Panel: Recent Labs  Lab 06/12/18 0557  NA 135  K 4.2  CL 96*  CO2 25  GLUCOSE 140*  BUN 28*  CREATININE 2.06*  CALCIUM 8.9  MG 1.9    Liver Function Tests: No results for input(s): AST, ALT, ALKPHOS, BILITOT, PROT, ALBUMIN in the last 168 hours. No  results for input(s): LIPASE, AMYLASE in the last 168 hours. No results for input(s): AMMONIA in the last 168 hours.  CBC: No results for input(s): WBC, NEUTROABS, HGB, HCT, MCV, PLT in the last 168 hours.  Cardiac Enzymes: No results for input(s): CKTOTAL, CKMB, CKMBINDEX, TROPONINI in the last 168 hours.  BNP: BNP (last 3 results) Recent Labs    11/05/17 1310 12/02/17 1846 01/05/18 1431  BNP 515.3* 349.6* 516.7*    ProBNP (last 3 results) No results for input(s): PROBNP in the last 8760 hours.   CBG: Recent Labs  Lab 06/12/2018 1231 05/22/2018 1732 05/23/2018 2127 06/12/18 0645  GLUCAP 113* 92 185* 130*    Coagulation Studies: No results for input(s): LABPROT, INR in the last 72 hours.   Imaging  Dg Chest 2 View  Result Date: 06/12/2018 CLINICAL DATA:  78 year old male with a history of pacemaker insertion EXAM: CHEST - 2 VIEW COMPARISON:  05/25/2018, 01/21/2018, 01/09/2018 FINDINGS: Cardiomediastinal silhouette projects larger than the most recent comparison. Persisting opacity at the left lung base with obscuration of the left hemidiaphragm, blunting of left costophrenic angle. This corresponds to the meniscus on the lateral view. Thickening of the fissures. Mild interlobular septal thickening. Right IJ tunneled central catheter with the tip terminating at the superior cavoatrial junction. Interval replacement/repositioning of the left chest wall cardiac pacing device. IMPRESSION: Congestive heart failure with mild pulmonary edema and left-sided pleural effusion with associated atelectasis/consolidation. Interval replacement/repositioning of the left chest wall cardiac pacing device with 2 leads in place. Unchanged right IJ tunneled central venous catheter. Electronically Signed   By: Corrie Mckusick D.O.   On: 06/12/2018 09:35      Medications:     Current Medications: . apixaban  5 mg Oral BID  . dronabinol  5 mg Oral BID AC  . gabapentin  400 mg Oral QHS  .  magnesium oxide  400 mg Oral QHS  . Melatonin  6 mg Oral QHS  . multivitamin with minerals  1 tablet Oral Daily  . ondansetron  4 mg Oral BID  . pantoprazole  40 mg Oral Daily  . potassium chloride SA  20 mEq Oral Daily  . rosuvastatin  10 mg Oral Daily  . sertraline  25 mg Oral Daily  . Tafamidis Meglumine (Cardiac)  80 mg Oral QPM  . torsemide  20 mg Oral Daily  . traZODone  50 mg Oral QHS  . vitamin B-12  500 mcg Oral QODAY  . zolpidem  5 mg Oral QHS     Infusions: . amiodarone 60 mg/hr (06/12/18 0739)  . amiodarone    . milrinone 0.125 mcg/kg/min (06/12/18 0700)       Patient Profile   Dillon Andrews is a 78 y.o. malewith a hx of chronic combined CHF due to NICM, CKD III,presumedNASH(per pt, never proven),esophageal varices per chart(prior glass of wine occasionally but no heavy alcohol),HTN, HLD, DM, Barrett's esophagus, GERD, anemia, RBBB, PAF requiring multiple DCCV and now s/p atrial flutter ablation 05/22/18. He is on home milrinone 0.125 followed by [facility].   He was admitted for RV lead revision. Noted to have volume overload this morning, so HF team consulted.   Assessment/Plan   1. 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 -Echo 06/2017: EF 25-30%, mild LAE, mild/mod RV dilation, mod/severe RA. Echo 10/2017: EF 10% - Echo 12/19 EF 35-40%  -PYP scan strongly suggestive of TTR amyloid, SPEP with 0.8% m-spike, UPEP with Bence Jones Protein positive, kappa type. 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 - Dr Marin Olp saw and thinks he does not have AL amyloid (just multiple myeloma) and that he has TTR amyloid and should be treated with tafamidis before chemo. Tafamidis started on 8/2.  - Volume elevated. - Received 40 mg IV lasix + 20 mg torsemide this am. Increase lasix to 80 mg IV BID. - Main issue is need to restore NSR. Plan DC-CV tomorrow. He has failed amio. AA options verylimited with  low EF and CKD. Will d/w Dr. Curt Bears about possible ablation.  - Continue milrinone 0.125 mcg. Managed by [facility] If we can maintain NSR may be able to wean milrinone soon and perhaps that will also help reduce recurrence  of AF. Re-check coox today. (91% this am is inaccurate) May be low with recurrent AF, but would not want to increase milrinone because it would make AF worse. Cannot add dig with AKI.  - Continue tafamidis 80 mg daily - No ARB/entresto/spirowith for now with CKD - Has been off hydralazine/imdur. SBP 90-100s  2. CKD III, baseline ~1.6 - Creatinine up to 2.06 today.  - Monitor BMET with diuresis.   3.AtrialFlutters/p DCCV 7/31, 8/2, 12/19/2017 -> junctional rhythm with symptomatic bradycardia -> MDT PPM. S/p Aflutter ablation 2/7. - Failed amiodarone with recurrent nausea.  Now on marinol. Dr Curt Bears thinking of restarting PO amio.  -Continue Eliquis 5 mg BID. Denies bleeding.  - Remains in afib 100s. On amio drip.  - May need to repeat DCCV prior to DC. He feels poorly when out of rhythm. Denies any missed doses of Eliquis.   4. [place] - Continue crestor. No change.   5. [place] with Dr. Marin Olp  -Started on Bortezomib7/18/18andRevlimid7/23/19. - Dr Marin Olp thinks he does not have AL amyloid and that he does have TTR amyloid and should be treated with tafamidis before chemo. ContinueTafamidis.   6. NASH with esophageal varices.  - [facility] surveillance of Esophageal varices, Barrett's esophagus, and PMHx of Colon polyps. Showed two columns of small esophageal varices w/out stigmata, short segment Barrett's esophagus (without biopsies, due to presence of EVs), 5cm hiatal hernia, and hyperplastic small gastric nodules. - Continue Eliquis 5 mg BID for afib. Denies bleeding.   7. Hx ofRectal bleeding - EGD/Colon deferred with recent (2017) showing diverticulosis and hemorrhoids. - No bleeding.   8.  Syncope,CHB S/P Medtronic PPM.  - s/p RV lead revision 2/27  9. Urinary retention - Voiding small amounts at a time and feels like he has residual. Bladder scan showed 300 mls. Add flomax. He has orders for I&O cath for scan >500 mls  Will discuss plan further with Dr [name].   Medication concerns reviewed with patient and pharmacy team. Barriers identified: none  Length of Stay: 0  Georgiana Shore, NP  06/12/2018, 10:20 AM  Advanced Heart Failure Team Pager [phone] (M-F; [place])  Please contact [facility] for night-coverage after hours (4p -7a ) and weekends on [email or web address]  Patient seen and examined with the above-signed Advanced Practice Provider and/or Housestaff. I personally reviewed laboratory data, imaging studies and relevant notes. I independently examined the patient and formulated the important aspects of the plan. I have edited the note to reflect any of my changes or salient points. I have personally discussed the plan with the patient and/or family.  78 y/o male with TTR cardiac amyloid, PAF/AFL, chronic systolic HF most recent EF 35%. Recently underwent AFL ablation. Admitted yesterday for RV lead adjustment.   Over past week has had worsening HF symptoms with volume overload poor urine output. Found to be back in AF - not tolerating AF well. Previously has not tolerated oral amio due to tremors and nausea. Case d/w Dr. Curt Bears. Will try short term IV amio. Increase lasix to 120IV and add metolazone. Repeat co-ox.   On exam Weak appeaaring JVP to jaw IRR  Lungs +crackles Ab soft NT  Ext cool 1+ edema.   Very tenuous. Watch closely. May need ICU care.   Glori Bickers, MD  6:32 PM

## 2018-06-12 NOTE — Progress Notes (Signed)
   ADVANCED HF TEAM NOTE  Patient developed multiple episodes of rapid VT on the floor despite IV amiodarone associated with hemodynamic and respiratory distress.  Amiodarone bolused without improvement. With the help of RR team, I personally transported him to the CCU.   Given 100 IV lidocaine and repeat amio bolus. BP dropped into 60s. Norepi started.   Patient with apparent rapid atrial tach in between VT. Dr. Curt Bears (EP) joined me at the bedside and attempted to pace him out. Pacing put him into a more stable atrial flutter.   Patient increasingly agitated and uncomfortable.   CCM paged and patient emergently intubated.   After intubation I then cardioverted him into NSR with 150 J shock. Central line and arterial line placed.   SBP now 110-120 range on NE 16. Will wean as tolerated. Check CVP and co-ox. Will need diuresis.   CRITICAL CARE Performed by: Glori Bickers  Total critical care time: 60 minutes  Critical care time was exclusive of separately billable procedures and treating other patients.  Critical care was necessary to treat or prevent imminent or life-threatening deterioration.  Critical care was time spent personally by me (independent of midlevel providers or residents) on the following activities: development of treatment plan with patient and/or surrogate as well as nursing, discussions with consultants, evaluation of patient's response to treatment, examination of patient, obtaining history from patient or surrogate, ordering and performing treatments and interventions, ordering and review of laboratory studies, ordering and review of radiographic studies, pulse oximetry and re-evaluation of patient's condition.  Glori Bickers, MD  8:09 PM

## 2018-06-13 DIAGNOSIS — E873 Alkalosis: Secondary | ICD-10-CM | POA: Diagnosis not present

## 2018-06-13 DIAGNOSIS — I472 Ventricular tachycardia: Secondary | ICD-10-CM | POA: Diagnosis not present

## 2018-06-13 DIAGNOSIS — N179 Acute kidney failure, unspecified: Secondary | ICD-10-CM | POA: Diagnosis not present

## 2018-06-13 DIAGNOSIS — I130 Hypertensive heart and chronic kidney disease with heart failure and stage 1 through stage 4 chronic kidney disease, or unspecified chronic kidney disease: Secondary | ICD-10-CM | POA: Diagnosis present

## 2018-06-13 DIAGNOSIS — D631 Anemia in chronic kidney disease: Secondary | ICD-10-CM | POA: Diagnosis present

## 2018-06-13 DIAGNOSIS — Y712 Prosthetic and other implants, materials and accessory cardiovascular devices associated with adverse incidents: Secondary | ICD-10-CM | POA: Diagnosis present

## 2018-06-13 DIAGNOSIS — E871 Hypo-osmolality and hyponatremia: Secondary | ICD-10-CM | POA: Diagnosis not present

## 2018-06-13 DIAGNOSIS — J810 Acute pulmonary edema: Secondary | ICD-10-CM | POA: Diagnosis not present

## 2018-06-13 DIAGNOSIS — I4892 Unspecified atrial flutter: Secondary | ICD-10-CM | POA: Diagnosis present

## 2018-06-13 DIAGNOSIS — I5021 Acute systolic (congestive) heart failure: Secondary | ICD-10-CM | POA: Diagnosis not present

## 2018-06-13 DIAGNOSIS — I428 Other cardiomyopathies: Secondary | ICD-10-CM | POA: Diagnosis present

## 2018-06-13 DIAGNOSIS — N183 Chronic kidney disease, stage 3 (moderate): Secondary | ICD-10-CM | POA: Diagnosis present

## 2018-06-13 DIAGNOSIS — I4510 Unspecified right bundle-branch block: Secondary | ICD-10-CM | POA: Diagnosis present

## 2018-06-13 DIAGNOSIS — I509 Heart failure, unspecified: Secondary | ICD-10-CM | POA: Diagnosis not present

## 2018-06-13 DIAGNOSIS — E875 Hyperkalemia: Secondary | ICD-10-CM | POA: Diagnosis present

## 2018-06-13 DIAGNOSIS — E440 Moderate protein-calorie malnutrition: Secondary | ICD-10-CM | POA: Diagnosis present

## 2018-06-13 DIAGNOSIS — Z515 Encounter for palliative care: Secondary | ICD-10-CM | POA: Diagnosis present

## 2018-06-13 DIAGNOSIS — G9340 Encephalopathy, unspecified: Secondary | ICD-10-CM | POA: Diagnosis not present

## 2018-06-13 DIAGNOSIS — T82120A Displacement of cardiac electrode, initial encounter: Secondary | ICD-10-CM | POA: Diagnosis present

## 2018-06-13 DIAGNOSIS — R570 Cardiogenic shock: Secondary | ICD-10-CM | POA: Diagnosis not present

## 2018-06-13 DIAGNOSIS — K7581 Nonalcoholic steatohepatitis (NASH): Secondary | ICD-10-CM | POA: Diagnosis present

## 2018-06-13 DIAGNOSIS — I5043 Acute on chronic combined systolic (congestive) and diastolic (congestive) heart failure: Secondary | ICD-10-CM | POA: Diagnosis present

## 2018-06-13 DIAGNOSIS — J9601 Acute respiratory failure with hypoxia: Secondary | ICD-10-CM | POA: Diagnosis not present

## 2018-06-13 DIAGNOSIS — C9000 Multiple myeloma not having achieved remission: Secondary | ICD-10-CM | POA: Diagnosis present

## 2018-06-13 DIAGNOSIS — N390 Urinary tract infection, site not specified: Secondary | ICD-10-CM | POA: Diagnosis not present

## 2018-06-13 DIAGNOSIS — N170 Acute kidney failure with tubular necrosis: Secondary | ICD-10-CM | POA: Diagnosis present

## 2018-06-13 DIAGNOSIS — E854 Organ-limited amyloidosis: Secondary | ICD-10-CM | POA: Diagnosis present

## 2018-06-13 DIAGNOSIS — I480 Paroxysmal atrial fibrillation: Secondary | ICD-10-CM | POA: Diagnosis present

## 2018-06-13 DIAGNOSIS — I442 Atrioventricular block, complete: Secondary | ICD-10-CM | POA: Diagnosis present

## 2018-06-13 DIAGNOSIS — E1122 Type 2 diabetes mellitus with diabetic chronic kidney disease: Secondary | ICD-10-CM | POA: Diagnosis present

## 2018-06-13 DIAGNOSIS — I5023 Acute on chronic systolic (congestive) heart failure: Secondary | ICD-10-CM | POA: Diagnosis not present

## 2018-06-13 LAB — GLUCOSE, CAPILLARY
GLUCOSE-CAPILLARY: 175 mg/dL — AB (ref 70–99)
Glucose-Capillary: 135 mg/dL — ABNORMAL HIGH (ref 70–99)
Glucose-Capillary: 127 mg/dL — ABNORMAL HIGH (ref 70–99)
Glucose-Capillary: 142 mg/dL — ABNORMAL HIGH (ref 70–99)
Glucose-Capillary: 117 mg/dL — ABNORMAL HIGH (ref 70–99)
Glucose-Capillary: 117 mg/dL — ABNORMAL HIGH (ref 70–99)
Glucose-Capillary: 175 mg/dL — ABNORMAL HIGH (ref 70–99)

## 2018-06-13 LAB — BASIC METABOLIC PANEL
Anion gap: 15 (ref 5–15)
BUN: 35 mg/dL — ABNORMAL HIGH (ref 8–23)
CHLORIDE: 96 mmol/L — AB (ref 98–111)
CO2: 24 mmol/L (ref 22–32)
Calcium: 8.9 mg/dL (ref 8.9–10.3)
Creatinine, Ser: 2.62 mg/dL — ABNORMAL HIGH (ref 0.61–1.24)
GFR calc Af Amer: 26 mL/min — ABNORMAL LOW (ref >60)
GFR calc non Af Amer: 22 mL/min — ABNORMAL LOW (ref >60)
Glucose, Bld: 153 mg/dL — ABNORMAL HIGH (ref 70–99)
Potassium: 4.3 mmol/L (ref 3.5–5.1)
Sodium: 135 mmol/L (ref 135–145)

## 2018-06-13 LAB — COOXEMETRY PANEL
CARBOXYHEMOGLOBIN: 1 % (ref 0.5–1.5)
Methemoglobin: 0.8 % (ref 0.0–1.5)
O2 Saturation: 67.3 %
Total hemoglobin: 15.4 g/dL (ref 12.0–16.0)

## 2018-06-13 LAB — CBC
HCT: 45.9 % (ref 39.0–52.0)
Hemoglobin: 14.6 g/dL (ref 13.0–17.0)
MCH: 31.5 pg (ref 26.0–34.0)
MCHC: 31.8 g/dL (ref 30.0–36.0)
MCV: 98.9 fL (ref 80.0–100.0)
Platelets: 240 10*3/uL (ref 150–400)
RBC: 4.64 MIL/uL (ref 4.22–5.81)
RDW: 20 % — ABNORMAL HIGH (ref 11.5–15.5)
WBC: 13.1 10*3/uL — AB (ref 4.0–10.5)
nRBC: 0.3 % — ABNORMAL HIGH (ref 0.0–0.2)

## 2018-06-13 LAB — MAGNESIUM
Magnesium: 2.5 mg/dL — ABNORMAL HIGH (ref 1.7–2.4)
Magnesium: 2.4 mg/dL (ref 1.7–2.4)

## 2018-06-13 LAB — PHOSPHORUS
Phosphorus: 5.7 mg/dL — ABNORMAL HIGH (ref 2.5–4.6)
Phosphorus: 5.3 mg/dL — ABNORMAL HIGH (ref 2.5–4.6)

## 2018-06-13 MED ORDER — VITAL 1.5 CAL PO LIQD
1000.0000 mL | ORAL | Status: DC
  Administered 2018-06-13 – 2018-06-17 (×5): 1000 mL
  Filled 2018-06-13 (×8): qty 1000

## 2018-06-13 MED ORDER — NOREPINEPHRINE 16 MG/250ML-% IV SOLN
0.0000 ug/min | INTRAVENOUS | Status: DC
  Administered 2018-06-13: 18 ug/min via INTRAVENOUS
  Administered 2018-06-14: 17 ug/min via INTRAVENOUS
  Administered 2018-06-15: 23 ug/min via INTRAVENOUS
  Administered 2018-06-15: 22 ug/min via INTRAVENOUS
  Administered 2018-06-16: 20 ug/min via INTRAVENOUS
  Administered 2018-06-17: 13 ug/min via INTRAVENOUS
  Administered 2018-06-18: 8 ug/min via INTRAVENOUS
  Administered 2018-06-20: 6 ug/min via INTRAVENOUS
  Filled 2018-06-13 (×9): qty 250

## 2018-06-13 MED ORDER — PRO-STAT SUGAR FREE PO LIQD
30.0000 mL | Freq: Three times a day (TID) | ORAL | Status: DC
  Administered 2018-06-13 – 2018-06-19 (×17): 30 mL
  Filled 2018-06-13 (×17): qty 30

## 2018-06-13 MED ORDER — INSULIN ASPART 100 UNIT/ML ~~LOC~~ SOLN
0.0000 [IU] | SUBCUTANEOUS | Status: DC
  Administered 2018-06-13 (×4): 1 [IU] via SUBCUTANEOUS
  Administered 2018-06-14 (×2): 2 [IU] via SUBCUTANEOUS
  Administered 2018-06-14: 1 [IU] via SUBCUTANEOUS
  Administered 2018-06-14 – 2018-06-15 (×6): 2 [IU] via SUBCUTANEOUS
  Administered 2018-06-15: 3 [IU] via SUBCUTANEOUS
  Administered 2018-06-15 – 2018-06-16 (×4): 2 [IU] via SUBCUTANEOUS
  Administered 2018-06-16: 3 [IU] via SUBCUTANEOUS
  Administered 2018-06-16 – 2018-06-17 (×4): 2 [IU] via SUBCUTANEOUS
  Administered 2018-06-17: 1 [IU] via SUBCUTANEOUS
  Administered 2018-06-17: 3 [IU] via SUBCUTANEOUS
  Administered 2018-06-17 – 2018-06-18 (×5): 2 [IU] via SUBCUTANEOUS
  Administered 2018-06-18 (×2): 1 [IU] via SUBCUTANEOUS
  Administered 2018-06-18: 2 [IU] via SUBCUTANEOUS
  Administered 2018-06-19 – 2018-06-21 (×11): 1 [IU] via SUBCUTANEOUS
  Administered 2018-06-21 – 2018-06-22 (×4): 2 [IU] via SUBCUTANEOUS
  Administered 2018-06-22: 1 [IU] via SUBCUTANEOUS

## 2018-06-13 MED ORDER — VITAL HIGH PROTEIN PO LIQD: 1000.0000 mL | ORAL | Status: DC

## 2018-06-13 MED ORDER — FUROSEMIDE 10 MG/ML IJ SOLN
20.0000 mg/h | INTRAVENOUS | Status: DC
  Administered 2018-06-13 – 2018-06-15 (×5): 20 mg/h via INTRAVENOUS
  Filled 2018-06-13: qty 20
  Filled 2018-06-13 (×10): qty 25

## 2018-06-13 MED ORDER — METOLAZONE 5 MG PO TABS
5.0000 mg | ORAL_TABLET | Freq: Once | ORAL | Status: AC
  Administered 2018-06-13: 5 mg via ORAL
  Filled 2018-06-13: qty 1

## 2018-06-13 NOTE — Progress Notes (Signed)
Progress Note  Patient Name: Dillon Andrews Date of Encounter: 06/13/2018  Primary Cardiologist: Glenetta Hew, MD  AHF: Dr. [name] EP: Dr. Curt Bears  Subjective   Intubated lab no chest pain.  According to the nurses following blood pressure.  Concerns regarding lead non-capture  Inpatient Medications    Scheduled Meds: . apixaban  5 mg Oral BID  . chlorhexidine gluconate (MEDLINE KIT)  15 mL Mouth Rinse BID  . dronabinol  5 mg Oral BID AC  . gabapentin  400 mg Oral QHS  . insulin aspart  0-9 Units Subcutaneous Q4H  . magnesium oxide  400 mg Oral QHS  . mouth rinse  15 mL Mouth Rinse 10 times per day  . Melatonin  6 mg Oral QHS  . multivitamin with minerals  1 tablet Oral Daily  . ondansetron  4 mg Oral BID  . pantoprazole sodium  40 mg Per Tube Daily  . potassium chloride  20 mEq Oral Daily  . rosuvastatin  10 mg Oral Daily  . sertraline  25 mg Oral Daily  . tamsulosin  0.4 mg Oral Daily  . traZODone  50 mg Oral QHS  . vitamin B-12  500 mcg Oral QODAY  . zolpidem  5 mg Oral QHS   Continuous Infusions: . sodium chloride 5 mL/hr at 06/13/18 1100  . amiodarone 60 mg/hr (06/13/18 1107)  . fentaNYL infusion INTRAVENOUS 25 mcg/hr (06/13/18 0700)  . furosemide (LASIX) infusion 20 mg/hr (06/13/18 1100)  . midazolam Stopped (06/13/18 0700)  . milrinone 0.125 mcg/kg/min (06/12/18 0700)  . norepinephrine (LEVOPHED) Adult infusion 18 mcg/min (06/13/18 1122)   PRN Meds: acetaminophen, alum & mag hydroxide-simeth, fentaNYL, hydrocortisone, ondansetron (ZOFRAN) IV, promethazine, traMADol   Vital Signs    Vitals:   06/13/18 1030 06/13/18 1045 06/13/18 1100 06/13/18 1146  BP:  (!) 86/65 92/69   Pulse: 87 75 76 90  Resp: _0 Temp:      TempSrc:      SpO2: 95% 94% 94% 94%  Weight:      Height:        Intake/Output Summary (Last 24 hours) at 06/13/2018 1210 Last data filed at 06/13/2018 1100 Gross per 24 hour  Intake 2607.62 ml  Output 275 ml  Net 2332.62  ml   Last 3 Weights 06/13/2018 06/12/2018 05/17/2018  Weight (lbs) 180 lb 8.9 oz 184 lb 1.4 oz 182 lb  Weight (kg) 81.9 kg 83.5 kg 82.555 kg      Telemetry    Telemetry reviewed.  No evidence of non-capture.  Competing pacing with pseudofusion  ECG       Physical Exam   Well developed and nourished i intubated but without distress HENT normal Neck supple   Clear Regular rate and rhythm, no murmurs or gallops Abd-soft with active BS No Clubbing cyanosis tredema Skin-warm and dry A & Oriented  Grossly normal sensory and motor function  L chest, site: dry, no hematoma  Labs    Chemistry Recent Labs  Lab 06/12/18 0557 06/12/18 2023 06/12/18 2158 06/13/18 0427  NA 135 133*  --  135  K 4.2 3.9  --  4.3  CL 96*  --   --  96*  CO2 25  --   --  24  GLUCOSE 140*  --   --  153*  BUN 28*  --   --  35*  CREATININE 2.06*  --   --  2.62*  CALCIUM 8.9  --   --  8.9  PROT  --   --  6.1*  --   ALBUMIN  --   --  2.9*  --   AST  --   --  65*  --   ALT  --   --  30  --   ALKPHOS  --   --  147*  --   BILITOT  --   --  1.7*  --   GFRNONAA 30*  --   --  22*  GFRAA 35*  --   --  26*  ANIONGAP 14  --   --  15     Hematology Recent Labs  Lab 06/12/18 2023 06/12/18 2158 06/13/18 0427  WBC  --  12.3* 13.1*  RBC  --  4.45 4.64  HGB 15.0 14.0 14.6  HCT 44.0 44.0 45.9  MCV  --  98.9 98.9  MCH  --  31.5 31.5  MCHC  --  31.8 31.8  RDW  --  19.8* 20.0*  PLT  --  217 240    Cardiac EnzymesNo results for input(s): TROPONINI in the last 168 hours. No results for input(s): TROPIPOC in the last 168 hours.   BNPNo results for input(s): BNP, PROBNP in the last 168 hours.   DDimer No results for input(s): DDIMER in the last 168 hours.   Radiology    Dg Chest 2 View  Result Date: 06/12/2018 CLINICAL DATA:  78 year old male with a history of pacemaker insertion EXAM: CHEST - 2 VIEW COMPARISON:  05/25/2018, 01/21/2018, 01/09/2018 FINDINGS: Cardiomediastinal silhouette projects  larger than the most recent comparison. Persisting opacity at the left lung base with obscuration of the left hemidiaphragm, blunting of left costophrenic angle. This corresponds to the meniscus on the lateral view. Thickening of the fissures. Mild interlobular septal thickening. Right IJ tunneled central catheter with the tip terminating at the superior cavoatrial junction. Interval replacement/repositioning of the left chest wall cardiac pacing device. IMPRESSION: Congestive heart failure with mild pulmonary edema and left-sided pleural effusion with associated atelectasis/consolidation. Interval replacement/repositioning of the left chest wall cardiac pacing device with 2 leads in place. Unchanged right IJ tunneled central venous catheter. Electronically Signed   By: Corrie Mckusick D.O.   On: 06/12/2018 09:35   Dg Chest Port 1 View  Result Date: 06/12/2018 CLINICAL DATA:  Intubation and line placements. EXAM: PORTABLE CHEST 1 VIEW COMPARISON:  06/12/2018 FINDINGS: Endotracheal tube was placed with tip measuring 2.6 cm above the carina. Enteric tube tip is in the left upper quadrant consistent with location in the upper stomach. Indwelling right subclavian catheter with tip over the low SVC region. New right internal jugular catheter with tip over the low SVC region. No pneumothorax. Cardiac pacemaker. Cardiac enlargement. Bilateral moderate-sized pleural effusions with basilar atelectasis demonstrating some progression since previous study. Calcification of the aorta. Mediastinal contours appear intact. Degenerative changes in the spine and shoulders. IMPRESSION: 1. Appliances appear in satisfactory position. 2. Bilateral moderate-sized pleural effusions with basilar atelectasis demonstrating some progression since previous study. 3. Cardiac enlargement. 4. No pneumothorax seen. Electronically Signed   By: Lucienne Capers M.D.   On: 06/12/2018 22:07    Cardiac Studies    05/22/2018:  EPS/ablation CONCLUSIONS:   1. Right atrial flutter upon presentation.   2. Successful radiofrequency ablation of atrial flutter along the cavotricuspid isthmus with complete bidirectional isthmus block achieved.   3. No early apparent complications.    11/10/2017: TEE/DCCV Study Conclusions - Left ventricle: The estimated ejection fraction was 10%. Diffuse  hypokinesis. There was spontaneous echo contrast, indicative of   stasis. No evidence of thrombus. - Aortic valve: No evidence of vegetation. - Mitral valve: No evidence of vegetation. There was mild   regurgitation. - Left atrium: The atrium was moderately dilated. No evidence of   thrombus in the atrial cavity or appendage. No evidence of   thrombus in the atrial cavity or appendage. - Right ventricle: Systolic function was severely reduced. - Right atrium: The atrium was dilated. - Atrial septum: No defect or patent foramen ovale was identified. - Tricuspid valve: No evidence of vegetation. There was moderate   regurgitation. - Pulmonic valve: No evidence of vegetation. Impressions: - Successful cardioversion. No cardiac source of emboli was   indentified.  Patient Profile     78 y.o. male with PMHx of chronic CHF combined HF with TTR amyloidosis (wild type) on home milrinone, NICM, CKD (III), AFlutter, Kappa Lightchain Myeloma, NASH with esophageal varices, CHB w/PPM  Assessment & Plan    1 junctional rhythm   Atrial arrhythmias   Cardiogenic shock   Pacemaker-[employer]    The patient's device was interrogated.  He was a junctional rhythm.  I have reprogrammed his device to atrial pacing at 90 to drive AV synchrony and increase the AV delay from 110--180 ms to augment ventricular filling time.  Map increased from 60--69    Virl Axe, MD  06/13/2018, 12:10 PM

## 2018-06-13 NOTE — Progress Notes (Signed)
Notified Bensimhon MD of patient's poor urine output despite lasix gtt. Orders for a one-time dose 5 mg of metolazone.

## 2018-06-13 NOTE — Progress Notes (Signed)
Decreased UOP observed, total 60m out since foley insertion few hours previously. [place] Awaiting additional orders at this time.

## 2018-06-13 NOTE — Plan of Care (Signed)
  Problem: Respiratory: Goal: Ability to maintain a clear airway and adequate ventilation will improve Outcome: Progressing   Problem: Cardiac: Goal: Ability to achieve and maintain adequate cardiopulmonary perfusion will improve Outcome: Progressing

## 2018-06-13 NOTE — Progress Notes (Addendum)
Advanced Heart Failure Rounding Note   Subjective:    Remains intubated. Sedated on vent. On NE 10 and amio 60 remains in NSR.   CVP up to 23. Co-ox 67% Creatinine worse. Not peeing.   Objective:   Weight Range:  Vital Signs:   Temp:  [97.5 F (36.4 C)-98.3 F (36.8 C)] 98.2 F (36.8 C) (02/29 0400) Pulse Rate:  [54-100] 88 (02/29 0821) Resp:  [0-32] 16 (02/29 0821) BP: (80-125)/(55-88) 97/72 (02/29 0800) SpO2:  [92 %-99 %] 95 % (02/29 0821) Arterial Line BP: (77-122)/(54-77) 91/61 (02/29 0815) FiO2 (%):  [40 %-100 %] 40 % (02/29 0821) Weight:  [81.9 kg] 81.9 kg (02/29 0500) Last BM Date: 05/25/2018  Weight change: Filed Weights   05/20/2018 1226 06/12/18 0555 06/13/18 0500  Weight: 82.6 kg 83.5 kg 81.9 kg    Intake/Output:   Intake/Output Summary (Last 24 hours) at 06/13/2018 0825 Last data filed at 06/13/2018 0800 Gross per 24 hour  Intake 2351.05 ml  Output 480 ml  Net 1871.05 ml     Physical Exam: General:  Sedated on vent HEENT: normal Neck: supple. RIJ TLC. JVP to ear. Carotids 2+ bilat; no bruits. No lymphadenopathy or thryomegaly appreciated. Cor: PMI nondisplaced. Regular rate & rhythm. 2/6 MR Lungs: clear Abdomen: soft, nontender, nondistended. No hepatosplenomegaly. No bruits or masses. Good bowel sounds. Extremities: no cyanosis, clubbing, rash, edema Neuro: sedated on vent  Telemetry:  NSR with v pacing 80s Personally reviewed   Labs: Basic Metabolic Panel: Recent Labs  Lab 06/12/18 0557 06/12/18 2023 06/12/18 2158 06/13/18 0427  NA 135 133*  --  135  K 4.2 3.9  --  4.3  CL 96*  --   --  96*  CO2 25  --   --  24  GLUCOSE 140*  --   --  153*  BUN 28*  --   --  35*  CREATININE 2.06*  --   --  2.62*  CALCIUM 8.9  --   --  8.9  MG 1.9  --  2.6* 2.5*  PHOS  --   --  5.1* 5.7*    Liver Function Tests: Recent Labs  Lab 06/12/18 2158  AST 65*  ALT 30  ALKPHOS 147*  BILITOT 1.7*  PROT 6.1*  ALBUMIN 2.9*   No results for  input(s): LIPASE, AMYLASE in the last 168 hours. No results for input(s): AMMONIA in the last 168 hours.  CBC: Recent Labs  Lab 06/12/18 2023 06/12/18 2158 06/13/18 0427  WBC  --  12.3* 13.1*  NEUTROABS  --  9.6*  --   HGB 15.0 14.0 14.6  HCT 44.0 44.0 45.9  MCV  --  98.9 98.9  PLT  --  217 240    Cardiac Enzymes: No results for input(s): CKTOTAL, CKMB, CKMBINDEX, TROPONINI in the last 168 hours.  BNP: BNP (last 3 results) Recent Labs    11/05/17 1310 12/02/17 1846 01/05/18 1431  BNP 515.3* 349.6* 516.7*    ProBNP (last 3 results) No results for input(s): PROBNP in the last 8760 hours.    Other results:  Imaging: Dg Chest 2 View  Result Date: 06/12/2018 CLINICAL DATA:  78 year old male with a history of pacemaker insertion EXAM: CHEST - 2 VIEW COMPARISON:  05/25/2018, 01/21/2018, 01/09/2018 FINDINGS: Cardiomediastinal silhouette projects larger than the most recent comparison. Persisting opacity at the left lung base with obscuration of the left hemidiaphragm, blunting of left costophrenic angle. This corresponds to the meniscus on the lateral  view. Thickening of the fissures. Mild interlobular septal thickening. Right IJ tunneled central catheter with the tip terminating at the superior cavoatrial junction. Interval replacement/repositioning of the left chest wall cardiac pacing device. IMPRESSION: Congestive heart failure with mild pulmonary edema and left-sided pleural effusion with associated atelectasis/consolidation. Interval replacement/repositioning of the left chest wall cardiac pacing device with 2 leads in place. Unchanged right IJ tunneled central venous catheter. Electronically Signed   By: Corrie Mckusick D.O.   On: 06/12/2018 09:35   Dg Chest Port 1 View  Result Date: 06/12/2018 CLINICAL DATA:  Intubation and line placements. EXAM: PORTABLE CHEST 1 VIEW COMPARISON:  06/12/2018 FINDINGS: Endotracheal tube was placed with tip measuring 2.6 cm above the carina.  Enteric tube tip is in the left upper quadrant consistent with location in the upper stomach. Indwelling right subclavian catheter with tip over the low SVC region. New right internal jugular catheter with tip over the low SVC region. No pneumothorax. Cardiac pacemaker. Cardiac enlargement. Bilateral moderate-sized pleural effusions with basilar atelectasis demonstrating some progression since previous study. Calcification of the aorta. Mediastinal contours appear intact. Degenerative changes in the spine and shoulders. IMPRESSION: 1. Appliances appear in satisfactory position. 2. Bilateral moderate-sized pleural effusions with basilar atelectasis demonstrating some progression since previous study. 3. Cardiac enlargement. 4. No pneumothorax seen. Electronically Signed   By: Lucienne Capers M.D.   On: 06/12/2018 22:07      Medications:     Scheduled Medications: . apixaban  5 mg Oral BID  . chlorhexidine gluconate (MEDLINE KIT)  15 mL Mouth Rinse BID  . dronabinol  5 mg Oral BID AC  . gabapentin  400 mg Oral QHS  . insulin aspart  0-9 Units Subcutaneous Q4H  . magnesium oxide  400 mg Oral QHS  . mouth rinse  15 mL Mouth Rinse 10 times per day  . Melatonin  6 mg Oral QHS  . multivitamin with minerals  1 tablet Oral Daily  . ondansetron  4 mg Oral BID  . pantoprazole sodium  40 mg Per Tube Daily  . potassium chloride  20 mEq Oral Daily  . rosuvastatin  10 mg Oral Daily  . sertraline  25 mg Oral Daily  . Tafamidis Meglumine (Cardiac)  80 mg Oral QPM  . tamsulosin  0.4 mg Oral Daily  . traZODone  50 mg Oral QHS  . vitamin B-12  500 mcg Oral QODAY  . zolpidem  5 mg Oral QHS     Infusions: . sodium chloride 5 mL/hr at 06/13/18 0800  . amiodarone 60 mg/hr (06/13/18 0800)  . fentaNYL infusion INTRAVENOUS 25 mcg/hr (06/13/18 0700)  . midazolam Stopped (06/13/18 0700)  . milrinone 0.125 mcg/kg/min (06/12/18 0700)  . norepinephrine (LEVOPHED) Adult infusion 10 mcg/min (06/13/18 0800)      PRN Medications:  acetaminophen, alum & mag hydroxide-simeth, fentaNYL, hydrocortisone, ondansetron (ZOFRAN) IV, promethazine, traMADol   Assessment:   Dillon Andrews a 78 y.o.malewith a hx of chronic combined CHF due to NICM with probable TTR amyloid, CKD III,presumedNASH(per pt, never proven),esophageal varices,  RBBB, PAF requiring multiple DCCV and now s/p atrial flutter ablation 05/22/18. He is on home milrinone 0.125 followed by [facility].   He was admitted for RV lead revision. Admission complicated by recurrent HF in setting of AF with RVR. On 2/28 decompensated with multiple episodes of VT complicated by respiratory failure leading to intubation and DC-CV   Plan/Discussion:    1. Acute on chronic systolic HF -> cardiogenic shock -NICM by  cath at [facility] 09/2016 -Echo 06/2017: EF 25-30%, mild LAE, mild/mod RV dilation, mod/severe RA. Echo 10/2017: EF 10% -Echo12/19 EF 35-40%. He has been on home milrinone -PYP scan strongly suggestive of TTR amyloid, SPEP with 0.8% m-spike, UPEP with Bence Jones Protein positive, kappa type. 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 Dr Marin Olp saw and thinks he does not have AL amyloid (just multiple myeloma) and that he has TTR amyloid and should be treated with tafamidis before chemo. Tafamidis started on 8/2.  - Decompensated in setting of recurrent AF and VT - Now on NE 10. Co-ox 67%. Course c/b ATN and renal syndrome - CVP 23.  - Continue NE. Start lasix gtt at 20.  - No ARB/entresto/spirowith for now with CKD - Has been off hydralazine/imdur. SBP 90-100s. No b-blocker with low output   2.Paroxysmal  AtrialFlutter/Fib - s/p DCCV of 7/31, 8/2, 12/19/2017  -  s/p aflutter ablation 2/7 -  had recurrent AF on admit. S/p emergent DC-CV 2/28 -  in NSR now on IV amio.Failedpo amiodarone previously with recurrent nausea. But does not tolerate AF well and will need AA drug -Continue Eliquis 5 mg BID   3. VT - improved with  IV lidocaine on 2/27 - now quiescent on IV amio - Keep K> 4.0 mg > 2.0  4. Acute hypoxic respiratory failure - Stable on vent - CCM following - will not be able to extubate until diuresed  5. h/o junctional rhythm with symptomatic bradycardia -> MDT PPM.  -RV lead revised this admission. Currently stable - EP following  6. Acute on chronic renal failure - likely due to ATN and cardiorenal syndrome - baseline creatinine 1.6-1.8 - creatinine 2.6 today. Follow closely. Support renal perfusion with inotropes. Diurese  7. TRR cardiac amyloid with Kappa Lightchain Myeloma - Follows with Dr. Marin Olp  -Started on Bortezomib7/18/18andRevlimid7/23/19. - Dr Marin Olp thinks he does not have AL amyloid and that he does have TTR amyloid and should be treated with tafamidis before chemo. ContinueTafamidis.  - Off tafamdis currently as pills can't be crushed  CRITICAL CARE Performed by: Glori Bickers  Total critical care time: 45 minutes  Critical care time was exclusive of separately billable procedures and treating other patients.  Critical care was necessary to treat or prevent imminent or life-threatening deterioration.  Critical care was time spent personally by me (independent of midlevel providers or residents) on the following activities: development of treatment plan with patient and/or surrogate as well as nursing, discussions with consultants, evaluation of patient's response to treatment, examination of patient, obtaining history from patient or surrogate, ordering and performing treatments and interventions, ordering and review of laboratory studies, ordering and review of radiographic studies, pulse oximetry and re-evaluation of patient's condition.   Length of Stay: 0   Glori Bickers MD 06/13/2018, 8:25 AM  Advanced Heart Failure Team Pager [phone] (M-F; [place])  Please contact [facility] for night-coverage after hours (4p -7a ) and weekends on  [email or web address]

## 2018-06-13 NOTE — Progress Notes (Signed)
Cardiology at bedside with ultrasound. No effusion. Pacer capturing consistently, HR 99-100.

## 2018-06-13 NOTE — Progress Notes (Addendum)
Patient's pacemaker showing inappropriate pacing on the monitor. Patient's BP dropping to 70s-80s with decrease in HR. Bensimhon MD paged. Will send EP to the bedside.  Addendum 1127Caryl Comes MD to bedside and made adjustments to pacemaker. Patient now AV pacing at a rate of 90. BP 90s-60s with MAPs 65-70.

## 2018-06-13 NOTE — Progress Notes (Signed)
Pacer not capturing intermittently with pauses. HR mid 70s, dropped as low as 61, then back to mid 70s. Cardiology notified. Dr. Algoma Lions to perform bedside echo. Pacer capturing with HR 100. Assessment ongoing.

## 2018-06-13 NOTE — Progress Notes (Signed)
Initial Nutrition Assessment  DOCUMENTATION CODES:   Non-severe (moderate) malnutrition in context of chronic illness  INTERVENTION:   Tube feeding:  Vital 1.5 at 40 ml/hr Pro-Stat 30 mL TID Provides 1740 kcals, 110 g of protein and 726 mL of free water Meets 100% of protein needs, calorie needs  NUTRITION DIAGNOSIS:   Moderate Malnutrition related to chronic illness(chronic CHF, CKD, NASH) as evidenced by moderate fat depletion, severe muscle depletion, moderate muscle depletion.  GOAL:   Patient will meet greater than or equal to 90% of their needs   MONITOR:   Vent status, TF tolerance, Labs, Weight trends  REASON FOR ASSESSMENT:   Ventilator    ASSESSMENT:    78 yo male admitted with acute on chronic CHF, developed respiratory distress requiring intubation, in shock requiring pressors, also AKI on CKD. PMH includes chronic combined CHF due NICM, with ECHO 2019 EF 25-30%, CKD III. presume NASH, esophageal varices, Barrett's esophagus, HTN, HLD, DM, GERD   2/28 Decompensated with multiple episodes of VT complicated by respiratory failure requiring intubation and  Cardioversion  Patient is currently intubated on ventilator support, noted plan for diuresis over the next several days which will be necessary before extubation. Sedated with fentanyl but arouseable, Levophed  MV: 8.2 L/min Temp (24hrs), Avg:98.4 F (36.9 C), Min:97.5 F (36.4 C), Max:99.5 F (37.5 C)  Abdomen soft, BS hypoactive, no BM OG tube in place  No family at bedside; unable to obtain diet and weight history. Noted pt is on marinol (appetite stimulant) likely indicating poor appetite PTA.   Recorded po intake 100% of lunch on 12/28. No other intake recorded Weight down to 81.9 kg from 83.5 kg yesterday. Unsure of dry wt but for now plan to utilize current wt of 81.9 kg  Based on current information available including NFPE pt meets clinical characteristics for MODERATE malnutrition in context  of chronic illness. Once able to obtain more information regarding diet and weight history, it is possible that pt may meet clinical characteristics for severe malnutrition  Labs: BUN 35, Creatinine 2.62, phosphorus 5.7 (H), magnesium 2.5  Meds: marinol, lasix drip, MVI with minerals, KCl, B-12  NUTRITION - FOCUSED PHYSICAL EXAM:    Most Recent Value  Orbital Region  Moderate depletion  Upper Arm Region  Severe depletion  Thoracic and Lumbar Region  Moderate depletion  Buccal Region  Unable to assess  Temple Region  Moderate depletion  Clavicle Bone Region  Severe depletion  Clavicle and Acromion Bone Region  Severe depletion  Scapular Bone Region  Severe depletion  Dorsal Hand  Unable to assess  Patellar Region  Mild depletion  Anterior Thigh Region  Mild depletion  Posterior Calf Region  Mild depletion  Edema (RD Assessment)  Moderate [b/l LE]       Diet Order:   Diet Order            Diet NPO time specified  Diet effective now              EDUCATION NEEDS:   Not appropriate for education at this time  Skin:  Skin Assessment: Reviewed RN Assessment  Last BM:  2/27  Height:   Ht Readings from Last 1 Encounters:  05/18/2018 5' 10"  (1.778 m)    Weight:   Wt Readings from Last 1 Encounters:  06/13/18 81.9 kg    Ideal Body Weight:  75.5 kg  BMI:  Body mass index is 25.91 kg/m.  Estimated Nutritional Needs:   Kcal:  1726  kcals   Protein:  95-130 g   Fluid:  per MD    Kerman Passey MS, RD, LDN, CNSC [phone] Pager  [phone] Weekend/On-Call Pager

## 2018-06-13 NOTE — Progress Notes (Signed)
PCCM Interval Note  Discussed case with Dr. [name] this morning.  Patient was successfully cardioverted yesterday evening, remains in NSR On norepinephrine and milrinone, planning for diuresis over the next several days which will be necessary before he can be extubated.   We will follow to assist with his ventilator status, work toward extubation as his hemodynamics and volume status improved.  I will see him formally on 3/1  Baltazar Apo, MD, PhD 06/13/2018, 11:31 AM [facility] [phone] or if no answer [phone]

## 2018-06-14 DIAGNOSIS — E440 Moderate protein-calorie malnutrition: Secondary | ICD-10-CM

## 2018-06-14 LAB — BASIC METABOLIC PANEL
ANION GAP: 14 (ref 5–15)
BUN: 48 mg/dL — ABNORMAL HIGH (ref 8–23)
CO2: 23 mmol/L (ref 22–32)
Calcium: 8.4 mg/dL — ABNORMAL LOW (ref 8.9–10.3)
Chloride: 96 mmol/L — ABNORMAL LOW (ref 98–111)
Creatinine, Ser: 3.37 mg/dL — ABNORMAL HIGH (ref 0.61–1.24)
GFR calc non Af Amer: 17 mL/min — ABNORMAL LOW (ref >60)
GFR, EST AFRICAN AMERICAN: 19 mL/min — AB (ref >60)
Glucose, Bld: 172 mg/dL — ABNORMAL HIGH (ref 70–99)
Potassium: 4.1 mmol/L (ref 3.5–5.1)
Sodium: 133 mmol/L — ABNORMAL LOW (ref 135–145)

## 2018-06-14 LAB — PHOSPHORUS
Phosphorus: 5.1 mg/dL — ABNORMAL HIGH (ref 2.5–4.6)
Phosphorus: 4.3 mg/dL (ref 2.5–4.6)

## 2018-06-14 LAB — GLUCOSE, CAPILLARY
GLUCOSE-CAPILLARY: 180 mg/dL — AB (ref 70–99)
Glucose-Capillary: 183 mg/dL — ABNORMAL HIGH (ref 70–99)
Glucose-Capillary: 172 mg/dL — ABNORMAL HIGH (ref 70–99)
Glucose-Capillary: 180 mg/dL — ABNORMAL HIGH (ref 70–99)

## 2018-06-14 LAB — COOXEMETRY PANEL
Carboxyhemoglobin: 0.8 % (ref 0.5–1.5)
Methemoglobin: 1.6 % — ABNORMAL HIGH (ref 0.0–1.5)
O2 SAT: 55.2 %
TOTAL HEMOGLOBIN: 14.4 g/dL (ref 12.0–16.0)

## 2018-06-14 LAB — MAGNESIUM
Magnesium: 2.4 mg/dL (ref 1.7–2.4)
Magnesium: 2.4 mg/dL (ref 1.7–2.4)

## 2018-06-14 MED ORDER — MILRINONE LACTATE IN DEXTROSE 20-5 MG/100ML-% IV SOLN
0.1250 ug/kg/min | INTRAVENOUS | Status: DC
  Administered 2018-06-14 – 2018-06-15 (×2): 0.125 ug/kg/min via INTRAVENOUS
  Filled 2018-06-14 (×3): qty 100

## 2018-06-14 MED ORDER — METOLAZONE 5 MG PO TABS
5.0000 mg | ORAL_TABLET | Freq: Two times a day (BID) | ORAL | Status: DC
  Administered 2018-06-14 – 2018-06-16 (×6): 5 mg via ORAL
  Filled 2018-06-14 (×6): qty 1

## 2018-06-14 NOTE — Progress Notes (Signed)
Advanced Heart Failure Rounding Note   Subjective:    Remains intubated. Sedated on vent. Will awaken and follow commands.  NE up from 10 -> 17.  Remains on milrinone 0.125 (home dose) . On amio @ 2.  Yesterday was in junctional rhythm.  Dr Caryl Comes changed pacer to DD pacing.   On lasix gtt at 20. Urine output poor. Weight up 10 pounds. Creatinine 2.06 -> 2.62 -> 3.37. Co-ox 55% CVP 17--18   Objective:   Weight Range:  Vital Signs:   Temp:  [97.7 F (36.5 C)-99.5 F (37.5 C)] 97.7 F (36.5 C) (03/01 0400) Pulse Rate:  [75-96] 90 (03/01 0530) Resp:  [12-26] 22 (03/01 0530) BP: (86-102)/(57-76) 94/71 (03/01 0500) SpO2:  [93 %-96 %] 94 % (03/01 0530) Arterial Line BP: (79-103)/(52-66) 94/61 (03/01 0530) FiO2 (%):  [40 %] 40 % (03/01 0340) Weight:  [86.3 kg] 86.3 kg (03/01 0300) Last BM Date: 06/12/2018  Weight change: Filed Weights   06/12/18 0555 06/13/18 0500 06/14/18 0300  Weight: 83.5 kg 81.9 kg 86.3 kg    Intake/Output:   Intake/Output Summary (Last 24 hours) at 06/14/2018 0802 Last data filed at 06/14/2018 0550 Gross per 24 hour  Intake 2446.11 ml  Output 515 ml  Net 1931.11 ml     Physical Exam: General:  Intubated. Sedated.  HEENT: normal  X ETT & OG Neck: supple. RIJ swan. JVP to ear. Carotids 2+ bilat; no bruits. No lymphadenopathy or thryomegaly appreciated. Cor: PMI nondisplaced. Regular rate & rhythm. No rubs, gallops or murmurs. Pacer site bandaged Lungs: clear Abdomen: soft, nontender, nondistended. No hepatosplenomegaly. No bruits or masses. Good bowel sounds. Extremities: no cyanosis, clubbing, rash, 1+  edema Neuro: alert & orientedx3, cranial nerves grossly intact. moves all 4 extremities w/o difficulty. Affect pleasant   Telemetry:  AV paced 90 Personally reviewed   Labs: Basic Metabolic Panel: Recent Labs  Lab 06/12/18 0557 06/12/18 2023 06/12/18 2158 06/13/18 0427 06/13/18 1634 06/14/18 0325  NA 135 133*  --  135  --  133*  K 4.2  3.9  --  4.3  --  4.1  CL 96*  --   --  96*  --  96*  CO2 25  --   --  24  --  23  GLUCOSE 140*  --   --  153*  --  172*  BUN 28*  --   --  35*  --  48*  CREATININE 2.06*  --   --  2.62*  --  3.37*  CALCIUM 8.9  --   --  8.9  --  8.4*  MG 1.9  --  2.6* 2.5* 2.4 2.4  PHOS  --   --  5.1* 5.7* 5.3* 5.1*    Liver Function Tests: Recent Labs  Lab 06/12/18 2158  AST 65*  ALT 30  ALKPHOS 147*  BILITOT 1.7*  PROT 6.1*  ALBUMIN 2.9*   No results for input(s): LIPASE, AMYLASE in the last 168 hours. No results for input(s): AMMONIA in the last 168 hours.  CBC: Recent Labs  Lab 06/12/18 2023 06/12/18 2158 06/13/18 0427  WBC  --  12.3* 13.1*  NEUTROABS  --  9.6*  --   HGB 15.0 14.0 14.6  HCT 44.0 44.0 45.9  MCV  --  98.9 98.9  PLT  --  217 240    Cardiac Enzymes: No results for input(s): CKTOTAL, CKMB, CKMBINDEX, TROPONINI in the last 168 hours.  BNP: BNP (last 3 results) Recent  Labs    11/05/17 1310 12/02/17 1846 01/05/18 1431  BNP 515.3* 349.6* 516.7*    ProBNP (last 3 results) No results for input(s): PROBNP in the last 8760 hours.    Other results:  Imaging: Dg Chest Port 1 View  Result Date: 06/12/2018 CLINICAL DATA:  Intubation and line placements. EXAM: PORTABLE CHEST 1 VIEW COMPARISON:  06/12/2018 FINDINGS: Endotracheal tube was placed with tip measuring 2.6 cm above the carina. Enteric tube tip is in the left upper quadrant consistent with location in the upper stomach. Indwelling right subclavian catheter with tip over the low SVC region. New right internal jugular catheter with tip over the low SVC region. No pneumothorax. Cardiac pacemaker. Cardiac enlargement. Bilateral moderate-sized pleural effusions with basilar atelectasis demonstrating some progression since previous study. Calcification of the aorta. Mediastinal contours appear intact. Degenerative changes in the spine and shoulders. IMPRESSION: 1. Appliances appear in satisfactory position. 2.  Bilateral moderate-sized pleural effusions with basilar atelectasis demonstrating some progression since previous study. 3. Cardiac enlargement. 4. No pneumothorax seen. Electronically Signed   By: Lucienne Capers M.D.   On: 06/12/2018 22:07     Medications:     Scheduled Medications: . apixaban  5 mg Oral BID  . chlorhexidine gluconate (MEDLINE KIT)  15 mL Mouth Rinse BID  . dronabinol  5 mg Oral BID AC  . feeding supplement (PRO-STAT SUGAR FREE 64)  30 mL Per Tube TID  . feeding supplement (VITAL 1.5 CAL)  1,000 mL Per Tube Q24H  . gabapentin  400 mg Oral QHS  . insulin aspart  0-9 Units Subcutaneous Q4H  . magnesium oxide  400 mg Oral QHS  . mouth rinse  15 mL Mouth Rinse 10 times per day  . Melatonin  6 mg Oral QHS  . multivitamin with minerals  1 tablet Oral Daily  . ondansetron  4 mg Oral BID  . pantoprazole sodium  40 mg Per Tube Daily  . potassium chloride  20 mEq Oral Daily  . rosuvastatin  10 mg Oral Daily  . sertraline  25 mg Oral Daily  . tamsulosin  0.4 mg Oral Daily  . traZODone  50 mg Oral QHS  . vitamin B-12  500 mcg Oral QODAY  . zolpidem  5 mg Oral QHS    Infusions: . sodium chloride 5 mL/hr at 06/14/18 0550  . amiodarone 60 mg/hr (06/14/18 0550)  . fentaNYL infusion INTRAVENOUS 25 mcg/hr (06/14/18 0550)  . furosemide (LASIX) infusion 20 mg/hr (06/14/18 0550)  . midazolam Stopped (06/13/18 0700)  . milrinone 0.125 mcg/kg/min (06/12/18 0700)  . norepinephrine (LEVOPHED) Adult infusion 17 mcg/min (06/14/18 0637)    PRN Medications: acetaminophen, alum & mag hydroxide-simeth, fentaNYL, hydrocortisone, ondansetron (ZOFRAN) IV, promethazine, traMADol   Assessment:   Dillon Bastos Knightis a 78 y.o.malewith a hx of chronic combined CHF due to NICM with probable TTR amyloid, CKD III,presumedNASH(per pt, never proven),esophageal varices,  RBBB, PAF requiring multiple DCCV and now s/p atrial flutter ablation 05/22/18. He is on home milrinone 0.125 followed by  [facility].   He was admitted for RV lead revision. Admission complicated by recurrent HF in setting of AF with RVR. On 2/28 decompensated with multiple episodes of VT complicated by respiratory failure leading to intubation and DC-CV   Plan/Discussion:    1. Acute on chronic systolic HF -> cardiogenic shock -NICM by cath at [facility] 09/2016 -Echo 06/2017: EF 25-30%, mild LAE, mild/mod RV dilation, mod/severe RA. Echo 10/2017: EF 10% -Echo12/19 EF 35-40%. He has been  on home milrinone -PYP scan strongly suggestive of TTR amyloid, SPEP with 0.8% m-spike, UPEP with Bence Jones Protein positive, kappa type. 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 Dr Marin Olp saw and thinks he does not have AL amyloid (just multiple myeloma) and that he has TTR amyloid and should be treated with tafamidis before chemo. Tafamidis started on 11/14/17.  - Decompensated in setting of recurrent AF and VT - Now on NE 17. Co-ox 55%. Course c/b ATN and cardiorenal syndrome - CVP 16-17. Diuresis limited by AKI.  - Continue NE. Continue lasix gtt at 20 and add metolazone 5 bid.  - No ARB/entresto/spirowith for now with CKD - Has been off hydralazine/imdur. No b-blocker with low output  2.Paroxysmal  AtrialFlutter/Fib - s/p DCCV of 7/31, 8/2, 12/19/2017  -  s/p aflutter ablation 2/7 -  had recurrent AF on admit. S/p emergent DC-CV 2/28 - On 2/29 in junctional rhythm. Pacer now set DD at 90 to force AV pacing. -  Continue IV amio. Can decrease to 30. Failedpo amiodarone previously with recurrent nausea. But does not tolerate AF well and will need AA drug -Continue Eliquis 5 mg BID   3. VT - improved with IV lidocaine on 2/27 - now quiescent on IV amio - Keep K> 4.0 mg > 2.0  4. Acute hypoxic respiratory failure - Stable on vent - CCM following - will not be able to extubate until diuresed  5. h/o junctional rhythm with symptomatic bradycardia -> MDT PPM.  -RV lead revised this admission. Currently stable -  EP following  6. Acute on chronic renal failure - likely due to ATN and cardiorenal syndrome - baseline creatinine 1.6-1.8 - creatinine 3.3 today. Follow closely. Support renal perfusion with inotropes. Diurese as able  7. 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 Marin Olp  -Started on Bortezomib7/18/18andRevlimid7/23/19. - Dr Marin Olp thinks he does not have AL amyloid and that he does have TTR amyloid and should be treated with tafamidis before chemo. ContinueTafamidis.  - Off tafamdis currently as pills can't be crushed  CRITICAL CARE Performed by: Glori Bickers  Total critical care time: 35 minutes  Critical care time was exclusive of separately billable procedures and treating other patients.  Critical care was necessary to treat or prevent imminent or life-threatening deterioration.  Critical care was time spent personally by me (independent of midlevel providers or residents) on the following activities: development of treatment plan with patient and/or surrogate as well as nursing, discussions with consultants, evaluation of patient's response to treatment, examination of patient, obtaining history from patient or surrogate, ordering and performing treatments and interventions, ordering and review of laboratory studies, ordering and review of radiographic studies, pulse oximetry and re-evaluation of patient's condition.   Length of Stay: 1   Glori Bickers MD 06/14/2018, 8:02 AM  Advanced Heart Failure Team Pager [phone] (M-F; [place])  Please contact [facility] for night-coverage after hours (4p -7a ) and weekends on [email or web address]

## 2018-06-14 DEATH — deceased

## 2018-06-15 ENCOUNTER — Inpatient Hospital Stay (HOSPITAL_COMMUNITY): Payer: Medicare HMO

## 2018-06-15 DIAGNOSIS — I509 Heart failure, unspecified: Secondary | ICD-10-CM

## 2018-06-15 DIAGNOSIS — J810 Acute pulmonary edema: Secondary | ICD-10-CM

## 2018-06-15 LAB — COOXEMETRY PANEL
Carboxyhemoglobin: 1.1 % (ref 0.5–1.5)
Methemoglobin: 1.7 % — ABNORMAL HIGH (ref 0.0–1.5)
O2 Saturation: 78.9 %
TOTAL HEMOGLOBIN: 12.4 g/dL (ref 12.0–16.0)

## 2018-06-15 LAB — BASIC METABOLIC PANEL
Anion gap: 12 (ref 5–15)
BUN: 63 mg/dL — ABNORMAL HIGH (ref 8–23)
CO2: 28 mmol/L (ref 22–32)
Calcium: 7.8 mg/dL — ABNORMAL LOW (ref 8.9–10.3)
Chloride: 93 mmol/L — ABNORMAL LOW (ref 98–111)
Creatinine, Ser: 3.92 mg/dL — ABNORMAL HIGH (ref 0.61–1.24)
GFR calc Af Amer: 16 mL/min — ABNORMAL LOW (ref >60)
GFR, EST NON AFRICAN AMERICAN: 14 mL/min — AB (ref >60)
GLUCOSE: 191 mg/dL — AB (ref 70–99)
POTASSIUM: 3.7 mmol/L (ref 3.5–5.1)
Sodium: 133 mmol/L — ABNORMAL LOW (ref 135–145)

## 2018-06-15 LAB — GLUCOSE, CAPILLARY
Glucose-Capillary: 149 mg/dL — ABNORMAL HIGH (ref 70–99)
Glucose-Capillary: 172 mg/dL — ABNORMAL HIGH (ref 70–99)
Glucose-Capillary: 174 mg/dL — ABNORMAL HIGH (ref 70–99)
Glucose-Capillary: 172 mg/dL — ABNORMAL HIGH (ref 70–99)
Glucose-Capillary: 208 mg/dL — ABNORMAL HIGH (ref 70–99)
Glucose-Capillary: 199 mg/dL — ABNORMAL HIGH (ref 70–99)
Glucose-Capillary: 199 mg/dL — ABNORMAL HIGH (ref 70–99)

## 2018-06-15 MED ORDER — SODIUM CHLORIDE 0.9% FLUSH
10.0000 mL | Freq: Two times a day (BID) | INTRAVENOUS | Status: DC
  Administered 2018-06-16: 10 mL
  Administered 2018-06-17: 30 mL
  Administered 2018-06-17 – 2018-06-22 (×6): 10 mL

## 2018-06-15 MED ORDER — CHLORHEXIDINE GLUCONATE CLOTH 2 % EX PADS
6.0000 | MEDICATED_PAD | Freq: Every day | CUTANEOUS | Status: DC
  Administered 2018-06-16 – 2018-06-22 (×6): 6 via TOPICAL

## 2018-06-15 MED ORDER — FENTANYL CITRATE (PF) 100 MCG/2ML IJ SOLN
25.0000 ug | INTRAMUSCULAR | Status: DC | PRN
  Administered 2018-06-15 – 2018-06-16 (×2): 25 ug via INTRAVENOUS
  Filled 2018-06-15 (×3): qty 2

## 2018-06-15 MED ORDER — SODIUM CHLORIDE 0.9% FLUSH: 10.0000 mL | INTRAVENOUS | Status: DC | PRN

## 2018-06-15 MED ORDER — POTASSIUM CHLORIDE 20 MEQ/15ML (10%) PO SOLN
20.0000 meq | Freq: Once | ORAL | Status: AC
  Administered 2018-06-15: 20 meq via ORAL
  Filled 2018-06-15: qty 15

## 2018-06-15 MED ORDER — CALCIUM GLUCONATE-NACL 1-0.675 GM/50ML-% IV SOLN
1.0000 g | Freq: Once | INTRAVENOUS | Status: AC
  Administered 2018-06-15: 1000 mg via INTRAVENOUS
  Filled 2018-06-15: qty 50

## 2018-06-15 NOTE — Plan of Care (Signed)
  Problem: Respiratory: Goal: Ability to maintain a clear airway and adequate ventilation will improve Outcome: Progressing   Problem: Role Relationship: Goal: Method of communication will improve Outcome: Progressing   Problem: Cardiac: Goal: Ability to achieve and maintain adequate cardiopulmonary perfusion will improve Outcome: Progressing

## 2018-06-15 NOTE — Progress Notes (Addendum)
Advanced Heart Failure Rounding Note   Subjective:    Remains intubated. Awake following commands.    NE @ 19 mcg + milrinone 0.125 mcg + amio 30 mg per hour. CO-OX 79%.   Creatinine trending up to 3.9. Remains on lasix drip @ 20 mg per hour. Remains + but CVP coming down.     Objective:   Weight Range:  Vital Signs:   Temp:  [97.8 F (36.6 C)-99.5 F (37.5 C)] 98.7 F (37.1 C) (03/02 0400) Pulse Rate:  [85-92] 90 (03/02 0600) Resp:  [16-33] 20 (03/02 0600) BP: (92-105)/(48-69) 104/48 (03/02 0405) SpO2:  [93 %-95 %] 95 % (03/02 0600) Arterial Line BP: (96-113)/(46-66) 105/48 (03/02 0600) FiO2 (%):  [40 %] 40 % (03/02 0406) Weight:  [88.6 kg] 88.6 kg (03/02 0318) Last BM Date: 06/02/2018  Weight change: Filed Weights   06/13/18 0500 06/14/18 0300 06/15/18 0318  Weight: 81.9 kg 86.3 kg 88.6 kg    Intake/Output:   Intake/Output Summary (Last 24 hours) at 06/15/2018 4696 Last data filed at 06/15/2018 0630 Gross per 24 hour  Intake 2928.01 ml  Output 2325 ml  Net 603.01 ml     Physical Exam: CVP 11  General:  Intubated  HEENT: ETT  & OG tube. normal Neck: supple. JVD 11-12 . Carotids 2+ bilat; no bruits. No lymphadenopathy or thryomegaly appreciated. RIJ  Cor: PMI nondisplaced. Regular rate & rhythm. No rubs, gallops or murmurs. Lungs: Rhonchi Abdomen: soft, nontender, nondistended. No hepatosplenomegaly. No bruits or masses. Good bowel sounds. Extremities: no cyanosis, clubbing, rash, R and LLE SCDs 1+ edema Neuro: awake follows commands.   Telemetry: A sensed V paced 90 s   Labs: Basic Metabolic Panel: Recent Labs  Lab 06/12/18 0557 06/12/18 2023 06/12/18 2158 06/13/18 0427 06/13/18 1634 06/14/18 0325 06/14/18 1709 06/15/18 0252  NA 135 133*  --  135  --  133*  --  133*  K 4.2 3.9  --  4.3  --  4.1  --  3.7  CL 96*  --   --  96*  --  96*  --  93*  CO2 25  --   --  24  --  23  --  28  GLUCOSE 140*  --   --  153*  --  172*  --  191*  BUN 28*  --    --  35*  --  48*  --  63*  CREATININE 2.06*  --   --  2.62*  --  3.37*  --  3.92*  CALCIUM 8.9  --   --  8.9  --  8.4*  --  7.8*  MG 1.9  --  2.6* 2.5* 2.4 2.4 2.4  --   PHOS  --   --  5.1* 5.7* 5.3* 5.1* 4.3  --     Liver Function Tests: Recent Labs  Lab 06/12/18 2158  AST 65*  ALT 30  ALKPHOS 147*  BILITOT 1.7*  PROT 6.1*  ALBUMIN 2.9*   No results for input(s): LIPASE, AMYLASE in the last 168 hours. No results for input(s): AMMONIA in the last 168 hours.  CBC: Recent Labs  Lab 06/12/18 2023 06/12/18 2158 06/13/18 0427  WBC  --  12.3* 13.1*  NEUTROABS  --  9.6*  --   HGB 15.0 14.0 14.6  HCT 44.0 44.0 45.9  MCV  --  98.9 98.9  PLT  --  217 240    Cardiac Enzymes: No results for input(s): CKTOTAL,  CKMB, CKMBINDEX, TROPONINI in the last 168 hours.  BNP: BNP (last 3 results) Recent Labs    11/05/17 1310 12/02/17 1846 01/05/18 1431  BNP 515.3* 349.6* 516.7*    ProBNP (last 3 results) No results for input(s): PROBNP in the last 8760 hours.    Other results:  Imaging: No results found.   Medications:     Scheduled Medications: . apixaban  5 mg Oral BID  . chlorhexidine gluconate (MEDLINE KIT)  15 mL Mouth Rinse BID  . dronabinol  5 mg Oral BID AC  . feeding supplement (PRO-STAT SUGAR FREE 64)  30 mL Per Tube TID  . feeding supplement (VITAL 1.5 CAL)  1,000 mL Per Tube Q24H  . gabapentin  400 mg Oral QHS  . insulin aspart  0-9 Units Subcutaneous Q4H  . magnesium oxide  400 mg Oral QHS  . mouth rinse  15 mL Mouth Rinse 10 times per day  . Melatonin  6 mg Oral QHS  . metolazone  5 mg Oral BID  . multivitamin with minerals  1 tablet Oral Daily  . ondansetron  4 mg Oral BID  . pantoprazole sodium  40 mg Per Tube Daily  . potassium chloride  20 mEq Oral Daily  . rosuvastatin  10 mg Oral Daily  . sertraline  25 mg Oral Daily  . tamsulosin  0.4 mg Oral Daily  . traZODone  50 mg Oral QHS  . vitamin B-12  500 mcg Oral QODAY  . zolpidem  5 mg Oral  QHS    Infusions: . sodium chloride 5 mL/hr at 06/15/18 0600  . amiodarone 30 mg/hr (06/15/18 0600)  . fentaNYL infusion INTRAVENOUS Stopped (06/15/18 0054)  . furosemide (LASIX) infusion 20 mg/hr (06/15/18 0600)  . midazolam Stopped (06/13/18 0700)  . milrinone 0.125 mcg/kg/min (06/15/18 0600)  . norepinephrine (LEVOPHED) Adult infusion 19 mcg/min (06/15/18 0600)    PRN Medications: acetaminophen, alum & mag hydroxide-simeth, fentaNYL, hydrocortisone, ondansetron (ZOFRAN) IV, promethazine, traMADol   Assessment:   Dillon Andrews a 78 y.o.malewith a hx of chronic combined CHF due to NICM with probable TTR amyloid, CKD III,presumedNASH(per pt, never proven),esophageal varices,  RBBB, PAF requiring multiple DCCV and now s/p atrial flutter ablation 05/22/18. He is on home milrinone 0.125 followed by [facility].   He was admitted for RV lead revision. Admission complicated by recurrent HF in setting of AF with RVR. On 2/28 decompensated with multiple episodes of VT complicated by respiratory failure leading to intubation and DC-CV   Plan/Discussion:    1. Acute on chronic systolic HF -> cardiogenic shock -NICM by cath at [facility] 09/2016 -Echo 06/2017: EF 25-30%, mild LAE, mild/mod RV dilation, mod/severe RA. Echo 10/2017: EF 10% -Echo12/19 EF 35-40%. He has been on home milrinone -PYP scan strongly suggestive of TTR amyloid, SPEP with 0.8% m-spike, UPEP with Bence Jones Protein positive, kappa type. 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 Dr Marin Olp saw and thinks he does not have AL amyloid (just multiple myeloma) and that he has TTR amyloid and should be treated with tafamidis before chemo. Tafamidis started on 11/14/17.  - Decompensated in setting of recurrent AF and VT - NE at 19 mcg. CO-OX 79%. Course c/b ATN and cardiorenal syndrome -  CVP down to 11.  Diuresis limited by AKI.  - Continue NE. Continue lasix gtt at 20 + metolazone 5 mg twice a day.   - No  ARB/entresto/spirowith for now with CKD - Has been off hydralazine/imdur. No b-blocker with  low output  2.Paroxysmal  AtrialFlutter/Fib - s/p DCCV of 7/31, 8/2, 12/19/2017  -  s/p aflutter ablation 2/7 -  had recurrent AF on admit. S/p emergent DC-CV 2/28 - On 2/29 in junctional rhythm. Pacer now set DD at 90 to force AV pacing. -  Continue IV amio.  Failedpo amiodarone previously with recurrent nausea. But does not tolerate AF well and will need AA drug -Continue Eliquis 5 mg BID   3. VT - improved with IV lidocaine on 2/27 - now quiescent on IV amio - Keep K> 4.0 mg > 2.0  4. Acute hypoxic respiratory failure - Stable on vent - CCM following    5. h/o junctional rhythm with symptomatic bradycardia -> MDT PPM.  -RV lead revised this admission. Currently stable - EP following  6. Acute on chronic renal failure - likely due to ATN and cardiorenal syndrome - baseline creatinine 1.6-1.8 - Creatinine continues to rise-->3.9. Follow closely. Support renal perfusion with inotropes. Diurese as able.  - Will need nephrology   7. 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 Marin Olp  -Started on Bortezomib7/18/18andRevlimid7/23/19. - Dr Marin Olp thinks he does not have AL amyloid and that he does have TTR amyloid and should be treated with tafamidis before chemo. ContinueTafamidis.  - Off tafamdis currently as pills can't be crushed  Length of Stay: 2   Amy Clegg NP-C  06/15/2018, 7:12 AM  Advanced Heart Failure Team Pager [phone] (M-F; 7a - 4p)  Please contact [facility] for night-coverage after hours (4p -7a ) and weekends on [email or web address]  Agree with above.   Awake on vent. Wants tube out. Remains on NE and milrinone. Co-ox ok. Creatinine getting worse but responding to IV lasix with good urine output. No uremia or acidosis. Electrolytes ok. Mostly v-pacing on tele  On vent awake.  RIJ TLC Cor RRR Lungs clear anteriorly Ab soft  NT Ext warm no edema  Remains tenuous on vent and dual pressors. Co-ox ok. Responding to high-dose diuretics but creatinine worse. Likely ATN. No current indication for HD. Wil continue to follow. Can consult Renal if not improving. On monitor it is unclear if he is back in AF. Have asked EP to review. Continue amio and apixaban for now.   CCM helping with vent. Would like to extubate today if possible but remains on pressors and renal function tenuous. Will d/w them.  CRITICAL CARE Performed by: Glori Bickers  Total critical care time: 35 minutes  Critical care time was exclusive of separately billable procedures and treating other patients.  Critical care was necessary to treat or prevent imminent or life-threatening deterioration.  Critical care was time spent personally by me (independent of midlevel providers or residents) on the following activities: development of treatment plan with patient and/or surrogate as well as nursing, discussions with consultants, evaluation of patient's response to treatment, examination of patient, obtaining history from patient or surrogate, ordering and performing treatments and interventions, ordering and review of laboratory studies, ordering and review of radiographic studies, pulse oximetry and re-evaluation of patient's condition.  Glori Bickers, MD  8:41 AM

## 2018-06-15 NOTE — Progress Notes (Signed)
eLink Physician-Brief Progress Note Patient Name: Dillon Andrews DOB: 12/04/1940 MRN: [id number]   Date of Service  06/15/2018  HPI/Events of Note  Calcium < 8. Cardiogenic shock, arhythmia/brady.   eICU Interventions  Calcium gluconate 1 gm IV over 2 hrs.      Intervention Category Intermediate Interventions: Electrolyte abnormality - evaluation and management  Elmer Sow 06/15/2018, 4:40 AM

## 2018-06-15 NOTE — Progress Notes (Signed)
NAME:  Dillon Andrews, MRN:  [id number], DOB:  [birth year], LOS: 2 ADMISSION DATE:  05/17/2018, CONSULTATION DATE:  06/12/2018 REFERRING MD:  Sela Hua, MD, CHIEF COMPLAINT:  Respiratory failure  Brief History   Dillon Andrews is a 78 YO gentleman with history significant for Chronic systolic HF on home milrinone due to TTR amyloidosis, atrial flutter, pace maker placement due to complete heart block with recent RV lead revision, chronic anticoagulation, NASH with esophageal varices  and CKD who is transferred to the CCU from the floor after he developed multiple episodes of rapid VT with hemodynamic instability requiring intubation and cardioversion  History of present illness   Dillon Andrews is a 78 YO gentleman with history significant for Chronic systolid HF, atrial flutter, pace maker placement due to complete heart block with recent RV lead revision, chronic anticoagulation and CKD who was admitted on 2/27 to the EP service for RV lead revision. He underwent lead revision on 2/27 with cardioversion during the procedure.   He went back into atrial fibrillation last night and was started on amiodarone drip without much improvement in his arrhythmias. This evening he developed multiple episodes of rapid Ventricular tachycardia associated with hypotension and respiratory extremis. He was transferred to the ICU and intubated and DC cardioverted by cardiology. He was given 148m of lidocaine and started on levophed for hypotension.  History is obtained from chart review as patient is intubated and sedated     Past Medical History  1. AFlutter     H/o ablation, known to have multiple circuits 2. NASH     W/esophageal varicies 3. NICM     Chronic CHF     On home milrinone 4. DM 5. HTN 6. CKD (III)  Significant Hospital Events   -Endotracheal intubation 2/28 -Right IJ central line 2/28 -Left radial A-line 2/28 -DC cardioversion 2/28  Consults:  -PCCM  Procedures:  As  above  Significant Diagnostic Tests:  05/22/2018: EPS/ablation CONCLUSIONS:  1. Right atrial flutter upon presentation.  2. Successful radiofrequency ablation of atrial flutter along the cavotricuspid isthmus with complete bidirectional isthmus block achieved.  3. No early apparent complications.   Echo 12.31/2019 - Left ventricle: The cavity size was normal. Wall thickness was   increased in a pattern of severe LVH. Systolic function was   moderately reduced. The estimated ejection fraction was in the   range of 35% to 40%. Diffuse hypokinesis. Doppler parameters are   consistent with a reversible restrictive pattern, indicative of   decreased left ventricular diastolic compliance and/or increased   left atrial pressure (grade 3 diastolic dysfunction). - Ventricular septum: The contour showed diastolic flattening. - Aortic valve: Trileaflet; mildly thickened, mildly calcified   leaflets. - Mitral valve: There was mild regurgitation. - Left atrium: The atrium was moderately dilated. - Right ventricle: The cavity size was moderately dilated. Wall   thickness was normal. - Right atrium: The atrium was moderately dilated. - Pulmonary arteries: Systolic pressure was moderately to severely   increased. PA peak pressure: 68 mm Hg (S).    11/10/2017: TEE/DCCV Study Conclusions - Left ventricle: The estimated ejection fraction was 10%. Diffuse hypokinesis. There was spontaneous echo contrast, indicative of stasis. No evidence of thrombus. - Aortic valve: No evidence of vegetation. - Mitral valve: No evidence of vegetation. There was mild regurgitation. - Left atrium: The atrium was moderately dilated. No evidence of thrombus in the atrial cavity or appendage. No evidence of thrombus in the atrial cavity or appendage. - Right  ventricle: Systolic function was severely reduced. - Right atrium: The atrium was dilated. - Atrial septum: No defect or patent foramen ovale was  identified. - Tricuspid valve: No evidence of vegetation. There was moderate regurgitation. - Pulmonic valve: No evidence of vegetation. Impressions: - Successful cardioversion. No cardiac source of emboli was indentified.  Micro Data:  -None   Antimicrobials:  -None   Subjective: No events overnight, no new complaints  Objective   Blood pressure 102/61, pulse 90, temperature 98.7 F (37.1 C), temperature source Oral, resp. rate 16, height 5' 10"  (1.778 m), weight 88.6 kg, SpO2 94 %. CVP:  [10 mmHg-16 mmHg] 16 mmHg  Vent Mode: PRVC FiO2 (%):  [40 %] 40 % Set Rate:  [16 bmp] 16 bmp Vt Set:  [500 mL-580 mL] 580 mL PEEP:  [5 cmH20-8 cmH20] 8 cmH20 Plateau Pressure:  [17 cmH20-22 cmH20] 22 cmH20   Intake/Output Summary (Last 24 hours) at 06/15/2018 1135 Last data filed at 06/15/2018 1100 Gross per 24 hour  Intake 3396.63 ml  Output 2845 ml  Net 551.63 ml   Filed Weights   06/13/18 0500 06/14/18 0300 06/15/18 0318  Weight: 81.9 kg 86.3 kg 88.6 kg    Examination: General: Intubated, sedated, acutely ill appearing male HENT: [place]/AT, PERRL, EOM-I and MMM Lungs: CTA bilaterally Cardiovascular: Paced, Nl S1/S2 and -M/R/G Abdomen: Soft, NT, ND and +BS Extremities: 1+ edema, improving Neuro: Sedated on midazolam and fentanyl, RASS -4 GU: Foley in situ  Assessment & Plan:  Dillon Andrews is a 78 YO gentleman with history significant for Chronic systolic HF on home milrinone due to TTR amyloidosis, atrial flutter, pace maker placement due to complete heart block with recent RV lead revision, chronic anticoagulation, NASH with esophageal varices  and CKD who is transferred to the CCU from the floor after he developed multiple episodes of rapid VT with hemodynamic instability requiring intubation and cardioversion  #Acute hypoxic respiratory failure Patient developed respiratory distress in the setting of rapid ventricular tachycardia requiring intubation and mechanical  ventilation. CXR with bilateral infiltrates suspicious of pulmonary edema - Continue Andrews vent support - Drop PEEP to 5 now that patient is diuresing - Adjust vent to ABG - ABG in AM - Hold PS trials until Andrews diuresed - VAP prevention bundle  #Shock Undifferentiated shock but suspect likely cardiogenic precipitated by arrhythmias. He is volume overloaded on exam and has had no signs or symptoms concerning for infection - Will hold off antibiotics for now - Obtain CBC - If he develops fever or other signs concerning for infection will obtain culture and start antibiotics - Levophed and milrinone per cards - Lasix drip ordered - Check ScVO2  #Multiple arrhythmias DC cardioverted tonight due to hemodynamic instability - Continue amiodarone gtt - Check electrolytes and replete as appropriate - Other management as per cardiology  #Acute on chronic systolic and diastolic Heart failure #TTR amyloid Patient with systolic and diastolic HF. He is volume overloaded on exam. - Diuresis as per cardiology - Daily weights - Strict in and out monitoring - He is on home inotrope, management as per cardiology  #Acute on chronic Kidney disease  Suspect likely due to venous congestion in the setting of volume overload - Daily BMP - Diuresis as per cardiology - Avoid nephrotoxic medications - Renally dose all medications  - BMET in AM - Replace electrolytes as indicated  Son updated bedside  Best practice:  Diet: Nutrition consult to initiate tube feeds  Pain/Anxiety/Delirium protocol (if indicated): Fentanyl, Midazolam  VAP protocol (if indicated): HoB elevated, PPI, Oral care  DVT prophylaxis: Eliquis GI prophylaxis: Pantoprazole  Glucose control: Maintain euglycemia  Mobility: PT and OT to evaluate and treat as appropriate  Code Status: Andrews code  Family Communication: Family updated  Disposition: Admit ICU   Labs   CBC: Recent Labs  Lab 06/12/18 2023 06/12/18 2158  06/13/18 0427  WBC  --  12.3* 13.1*  NEUTROABS  --  9.6*  --   HGB 15.0 14.0 14.6  HCT 44.0 44.0 45.9  MCV  --  98.9 98.9  PLT  --  217 676    Basic Metabolic Panel: Recent Labs  Lab 06/12/18 0557 06/12/18 2023 06/12/18 2158 06/13/18 0427 06/13/18 1634 06/14/18 0325 06/14/18 1709 06/15/18 0252  NA 135 133*  --  135  --  133*  --  133*  K 4.2 3.9  --  4.3  --  4.1  --  3.7  CL 96*  --   --  96*  --  96*  --  93*  CO2 25  --   --  24  --  23  --  28  GLUCOSE 140*  --   --  153*  --  172*  --  191*  BUN 28*  --   --  35*  --  48*  --  63*  CREATININE 2.06*  --   --  2.62*  --  3.37*  --  3.92*  CALCIUM 8.9  --   --  8.9  --  8.4*  --  7.8*  MG 1.9  --  2.6* 2.5* 2.4 2.4 2.4  --   PHOS  --   --  5.1* 5.7* 5.3* 5.1* 4.3  --     ABG    Component Value Date/Time   PHART 7.428 06/12/2018 2023   PCO2ART 36.9 06/12/2018 2023   PO2ART 115.0 (H) 06/12/2018 2023   HCO3 24.5 06/12/2018 2023   TCO2 26 06/12/2018 2023   O2SAT 78.9 06/15/2018 0300     HbA1C: Hgb A1c MFr Bld  Date/Time Value Ref Range Status  12/12/2017 12:13 PM 6.7 (H) 4.6 - 6.5 % Final    Comment:    Glycemic Control Guidelines for People with Diabetes:Non Diabetic:  <6%Goal of Therapy: <7%Additional Action Suggested:  >8%   11/05/2017 06:58 PM 7.5 (H) 4.8 - 5.6 % Final    Comment:    (NOTE) Pre diabetes:          5.7%-6.4% Diabetes:              >6.4% Glycemic control for   <7.0% adults with diabetes     CBG: Recent Labs  Lab 06/14/18 1631 06/14/18 1959 06/14/18 2340 06/15/18 0312 06/15/18 0752  GLUCAP 180* 172* 149* 174* 172*    Review of Systems:   Unable to obtain as patient is intubated and sedated  Past Medical History  He,  has a past medical history of Anemia (11/2016), Arthritis, Atrial tachycardia ([place]), Barrett's esophagus, Basal cell carcinoma, Chicken pox, Cholelithiasis (11/2016), Chronic combined systolic and diastolic CHF (congestive heart failure) ([place]), CKD (chronic kidney  disease), stage III (HCC), Esophageal varices (HCC), Family history of adverse reaction to anesthesia, GERD (gastroesophageal reflux disease), Goals of care, counseling/discussion (10/22/2017), High cholesterol, Hypertension, Kappa light chain myeloma ([place]) (10/22/2017), Mitral regurgitation, NAFLD (nonalcoholic fatty liver disease), NICM (nonischemic cardiomyopathy) ([place]), NSVT (nonsustained ventricular tachycardia) ([place]), Pneumonia (09/2016), Presence of permanent cardiac pacemaker (01/08/2018), RBBB, Tricuspid regurgitation, and Type II diabetes  mellitus ([place]).   Surgical History    Past Surgical History:  Procedure Laterality Date  . A-FLUTTER ABLATION N/A 05/22/2018   Procedure: A-FLUTTER ABLATION;  Surgeon: Constance Haw, MD;  Location: [place] [facility];  Service: Cardiovascular;  Laterality: N/A;  . BACK SURGERY    . BASAL CELL CARCINOMA EXCISION Right    temple; "had to do skin graft"  . CARDIAC CATHETERIZATION  09/2016   Minimal, non-occlusive CAD, EF 45% [facility])  . CARDIOVERSION N/A 11/10/2017   Procedure: CARDIOVERSION;  Surgeon: Jolaine Artist, MD;  Location: [place];  Service: Cardiovascular;  Laterality: N/A;  . CARDIOVERSION N/A 11/14/2017   Procedure: CARDIOVERSION;  Surgeon: Jolaine Artist, MD;  Location: [place];  Service: Cardiovascular;  Laterality: N/A;  . CARDIOVERSION N/A 12/19/2017   Procedure: CARDIOVERSION;  Surgeon: Jolaine Artist, MD;  Location[facility] ENDOSCOPY;  Service: Cardiovascular;  Laterality: N/A;  . CARDIOVERSION N/A 05/14/2018   Procedure: CARDIOVERSION;  Surgeon: Jolaine Artist, MD;  Location: [place];  Service: Cardiovascular;  Laterality: N/A;  . CARPAL TUNNEL RELEASE Bilateral   . CATARACT EXTRACTION W/ INTRAOCULAR LENS  IMPLANT, BILATERAL Bilateral   . CHOLECYSTECTOMY N/A 11/22/2016   Procedure: LAPAROSCOPIC CHOLECYSTECTOMY;  Surgeon: Coralie Keens, MD;  Location: [place];  Service: General;  Laterality:  N/A;  . IR FLUORO GUIDE CV LINE RIGHT  11/26/2017  . IR US GUIDE VASC ACCESS RIGHT  11/26/2017  . JOINT REPLACEMENT    . LEAD REVISION/REPAIR N/A 05/30/2018   Procedure: RV lead;  Surgeon: Constance Haw, MD;  Location: [place] CV LAB;  Service: Cardiovascular;  Laterality: N/A;  . LUMBAR DISC SURGERY  X 2  . PACEMAKER IMPLANT N/A 01/08/2018   Procedure: PACEMAKER IMPLANT;  Surgeon: Constance Haw, MD;  Location: [place] CV [facility];  Service: Cardiovascular;  Laterality: N/A;  . TEE WITHOUT CARDIOVERSION N/A 11/10/2017   Procedure: TRANSESOPHAGEAL ECHOCARDIOGRAM (TEE);  Surgeon: Jolaine Artist, MD;  Location: [facility] ENDOSCOPY;  Service: Cardiovascular;  Laterality: N/A;  . TONSILLECTOMY AND ADENOIDECTOMY    . TOTAL KNEE ARTHROPLASTY Left 2010  . TRANSESOPHAGEAL ECHOCARDIOGRAM  09/2016   Mild global LV hypokinesis. Moderate MR. Moderate TR. [facility])  . TRANSTHORACIC ECHOCARDIOGRAM  10/2016   [facility]) EF 35-40% with moderate HK. Normal RV function. Moderately severe TR.  [facility] Kitchen TRANSTHORACIC ECHOCARDIOGRAM  03/2017; 06/2017    A) ([place]) mild concentric LVH. EF of 40-45%. Diffuse HK with no RWMA. GR 2 DD. Septal dyssynergy. Mild MR, moderate TR. Severe RA dilation..;; B) Severe LVH.  EF 25 to 30% with mild HK of Septal wall.  Mod-severely dilated RA & Mod dilated RV.     Social History   reports that he quit smoking about 41 years ago. His smoking use included cigarettes. He has a 4.50 pack-year smoking history. He has never used smokeless tobacco. He reports current alcohol use. He reports that he does not use drugs.   Family History   His family history includes Alcohol abuse in his brother; Autoimmune disease in his daughter; Breast cancer in his sister; Cancer in his father; Cataracts in his father; Heart failure in his mother; Liver disease in his brother. There is no history of Cirrhosis.   Allergies No Known Allergies   Home Medications  Prior  to Admission medications   Medication Sig Start Date End Date Taking? Authorizing Provider  apixaban (ELIQUIS) 5 MG TABS tablet Take 1 tablet (5 mg total) by  mouth 2 (two) times daily. 05/29/18  Yes Camnitz, Will Hassell Done, MD  dronabinol (MARINOL) 5 MG capsule Take 5 mg by mouth 2 (two) times daily before a meal.   Yes [provider]  gabapentin (NEURONTIN) 800 MG tablet Take 400 mg by mouth daily.   Yes [provider]  Magnesium 500 MG TABS Take 500 mg by mouth at bedtime.   Yes [provider]  Melatonin 5 MG TABS Take 5 mg by mouth at bedtime.    Yes [provider]  milrinone (PRIMACOR) 20 MG/100 ML SOLN infusion Inject 0.0099 mg/min into the vein continuous. 11/27/17  Yes Shirley Friar, PA-C  Multiple Vitamin (MULTIVITAMIN WITH MINERALS) TABS tablet Take 1 tablet by mouth daily.   Yes [provider]  omeprazole (PRILOSEC) 40 MG capsule TAKE 1 CAPSULE(40 MG) BY MOUTH DAILY Patient taking differently: Take 40 mg by mouth daily.  02/11/18  Yes [name], MD  ondansetron (ZOFRAN) 4 MG tablet TAKE 1 TABLET(4 MG) BY MOUTH EVERY 8 HOURS AS NEEDED FOR NAUSEA OR VOMITING Patient taking differently: Take 4 mg by mouth 2 (two) times daily.  05/06/18  Yes [name], MD  potassium chloride SA (K-DUR,KLOR-CON) 20 MEQ tablet Take 20 mEq by mouth See admin instructions. Take 20 meq every morning may increase to 40 meq once daily as needed for excessive weight gain / swelling (take with 40 meq with 40 mg of torsemide)   Yes [provider]  rosuvastatin (CRESTOR) 10 MG tablet TAKE 1 TABLET BY MOUTH ONCE DAILY Patient taking differently: Take 10 mg by mouth daily.  02/09/18  Yes [name], MD  sertraline (ZOLOFT) 25 MG tablet TAKE 1 TABLET BY MOUTH EVERY DAY Patient taking differently: Take 25 mg by mouth daily.  04/28/18  Yes Bensimhon, Shaune Pascal, MD  torsemide (DEMADEX) 20 MG tablet Take 1 tablet (20 mg total)  by mouth daily. Can take 20 mg extra as needed. Patient taking differently: Take 20-40 mg by mouth See admin instructions. Take 20 mg daily, may take an 40 mg instead if there is excessive weight gain/swelling 11/27/17  Yes Tillery, Satira Mccallum, PA-C  traZODone (DESYREL) 50 MG tablet TAKE 1 TABLET(50 MG) BY MOUTH AT BEDTIME Patient taking differently: Take 50 mg by mouth at bedtime.  02/09/18  Yes [name], MD  vitamin B-12 (CYANOCOBALAMIN) 500 MCG tablet Take 500 mcg by mouth every other day.   Yes [provider]  zolpidem (AMBIEN) 5 MG tablet TAKE 1 TABLET(5 MG) BY MOUTH AT BEDTIME Patient taking differently: Take 5 mg by mouth at bedtime.  05/18/18  Yes [name], MD  glucose blood test strip Use as instructed 12/01/17   [name], MD  hydrocortisone (ANUSOL-HC) 25 MG suppository Place 25 mg rectally 2 (two) times daily as needed for hemorrhoids or anal itching.    [provider]  polyethylene glycol (MIRALAX / GLYCOLAX) packet Take 17 g by mouth daily. Patient taking differently: Take 17 g by mouth daily as needed for moderate constipation.  11/28/17   Nita Sells, MD  VYNDAQEL 20 MG CAPS Take 80 mg by mouth daily. (4 capsules) Patient taking differently: Take 80 mg by mouth every evening.  11/27/17   Shirley Friar, PA-C    The patient is critically ill with multiple organ systems failure and requires high complexity decision making for assessment and support, frequent evaluation and titration of therapies, application of advanced monitoring technologies and extensive  interpretation of multiple databases.   Critical Care Time devoted to patient care services described in this note is  32  Minutes. This time reflects time of care of this signee Dr Jennet Maduro. This critical care time does not reflect procedure time, or teaching time or supervisory time of PA/NP/Med student/Med Resident etc but could involve care  discussion time.  Rush Farmer, M.D. [facility] Pulmonary/Critical Care Medicine. Pager: [phone]. After hours pager: [phone].

## 2018-06-15 NOTE — Progress Notes (Signed)
eLink Physician-Brief Progress Note Patient Name: Dillon Andrews DOB: 1940/07/28 MRN: [id number]   Date of Service  06/15/2018  HPI/Events of Note  Informed by bedside ICU RN that pt is no longer on fentanyl infusion.   eICU Interventions  Changed fentanyl orders to IV PRN.      Intervention Category Minor Interventions: Routine modifications to care plan (e.g. PRN medications for pain, fever)  Elsie Lincoln 06/15/2018, 8:43 PM

## 2018-06-16 ENCOUNTER — Encounter: Payer: Medicare HMO | Admitting: Cardiology

## 2018-06-16 ENCOUNTER — Inpatient Hospital Stay (HOSPITAL_COMMUNITY): Payer: Medicare HMO

## 2018-06-16 LAB — COOXEMETRY PANEL
Carboxyhemoglobin: 1.4 % (ref 0.5–1.5)
Methemoglobin: 1 % (ref 0.0–1.5)
O2 Saturation: 77.3 %
Total hemoglobin: 10.4 g/dL — ABNORMAL LOW (ref 12.0–16.0)

## 2018-06-16 LAB — POCT I-STAT 7, (LYTES, BLD GAS, ICA,H+H)
Acid-Base Excess: 7 mmol/L — ABNORMAL HIGH (ref 0.0–2.0)
Bicarbonate: 30.3 mmol/L — ABNORMAL HIGH (ref 20.0–28.0)
Calcium, Ion: 1.08 mmol/L — ABNORMAL LOW (ref 1.15–1.40)
HCT: 35 % — ABNORMAL LOW (ref 39.0–52.0)
Hemoglobin: 11.9 g/dL — ABNORMAL LOW (ref 13.0–17.0)
O2 Saturation: 96 %
PCO2 ART: 39 mmHg (ref 32.0–48.0)
Patient temperature: 98.9
Potassium: 3.8 mmol/L (ref 3.5–5.1)
Sodium: 131 mmol/L — ABNORMAL LOW (ref 135–145)
TCO2: 31 mmol/L (ref 22–32)
pH, Arterial: 7.499 — ABNORMAL HIGH (ref 7.350–7.450)
pO2, Arterial: 78 mmHg — ABNORMAL LOW (ref 83.0–108.0)

## 2018-06-16 LAB — CBC
HCT: 33.8 % — ABNORMAL LOW (ref 39.0–52.0)
Hemoglobin: 11.4 g/dL — ABNORMAL LOW (ref 13.0–17.0)
MCH: 31.6 pg (ref 26.0–34.0)
MCHC: 33.7 g/dL (ref 30.0–36.0)
MCV: 93.6 fL (ref 80.0–100.0)
NRBC: 0 % (ref 0.0–0.2)
Platelets: 192 10*3/uL (ref 150–400)
RBC: 3.61 MIL/uL — ABNORMAL LOW (ref 4.22–5.81)
RDW: 19.2 % — ABNORMAL HIGH (ref 11.5–15.5)
WBC: 12.7 10*3/uL — AB (ref 4.0–10.5)

## 2018-06-16 LAB — BASIC METABOLIC PANEL
Anion gap: 16 — ABNORMAL HIGH (ref 5–15)
BUN: 81 mg/dL — AB (ref 8–23)
CO2: 26 mmol/L (ref 22–32)
Calcium: 8.3 mg/dL — ABNORMAL LOW (ref 8.9–10.3)
Chloride: 90 mmol/L — ABNORMAL LOW (ref 98–111)
Creatinine, Ser: 4.41 mg/dL — ABNORMAL HIGH (ref 0.61–1.24)
GFR calc Af Amer: 14 mL/min — ABNORMAL LOW (ref >60)
GFR calc non Af Amer: 12 mL/min — ABNORMAL LOW (ref >60)
GLUCOSE: 209 mg/dL — AB (ref 70–99)
Potassium: 3.7 mmol/L (ref 3.5–5.1)
Sodium: 132 mmol/L — ABNORMAL LOW (ref 135–145)

## 2018-06-16 LAB — PHOSPHORUS: Phosphorus: 3.3 mg/dL (ref 2.5–4.6)

## 2018-06-16 LAB — GLUCOSE, CAPILLARY
GLUCOSE-CAPILLARY: 191 mg/dL — AB (ref 70–99)
Glucose-Capillary: 190 mg/dL — ABNORMAL HIGH (ref 70–99)
Glucose-Capillary: 196 mg/dL — ABNORMAL HIGH (ref 70–99)
Glucose-Capillary: 184 mg/dL — ABNORMAL HIGH (ref 70–99)
Glucose-Capillary: 209 mg/dL — ABNORMAL HIGH (ref 70–99)
Glucose-Capillary: 181 mg/dL — ABNORMAL HIGH (ref 70–99)

## 2018-06-16 LAB — APTT: APTT: 124 s — AB (ref 24–36)

## 2018-06-16 LAB — HEPARIN LEVEL (UNFRACTIONATED)

## 2018-06-16 LAB — MAGNESIUM: Magnesium: 2.2 mg/dL (ref 1.7–2.4)

## 2018-06-16 MED ORDER — HEPARIN (PORCINE) 25000 UT/250ML-% IV SOLN
800.0000 [IU]/h | INTRAVENOUS | Status: DC
  Administered 2018-06-19 – 2018-06-20 (×2): 900 [IU]/h via INTRAVENOUS
  Administered 2018-06-21: 800 [IU]/h via INTRAVENOUS
  Filled 2018-06-16 (×4): qty 250

## 2018-06-16 MED ORDER — HEPARIN (PORCINE) 25000 UT/250ML-% IV SOLN
850.0000 [IU]/h | INTRAVENOUS | Status: DC
  Administered 2018-06-16: 1050 [IU]/h via INTRAVENOUS
  Filled 2018-06-16: qty 250

## 2018-06-16 NOTE — Progress Notes (Signed)
ANTICOAGULATION CONSULT NOTE - Initial Consult  Pharmacy Consult for Heparin -apixabanonhold Indication: atrial fibrillation  No Known Allergies  Patient Measurements: Height: 5' 10"  (177.8 cm) Weight: 189 lb 6 oz (85.9 kg) IBW/kg (Calculated) : 73   Vital Signs: Temp: 99.4 F (37.4 C) (03/03 1159) Temp Source: Oral (03/03 1159) BP: 114/65 (03/03 1159) Pulse Rate: 90 (03/03 1151)  Labs: Recent Labs    06/14/18 0325 06/15/18 0252 06/16/18 0401 06/16/18 0407  HGB  --   --  11.4* 11.9*  HCT  --   --  33.8* 35.0*  PLT  --   --  192  --   CREATININE 3.37* 3.92* 4.41*  --     Estimated Creatinine Clearance: 14.3 mL/min (A) (by C-G formula based on SCr of 4.41 mg/dL (H)).   Medical History: Past Medical History:  Diagnosis Date  . Anemia 11/2016  . Arthritis    "some; hands/fingers" (06/25/2017)  . Atrial tachycardia ([place])   . Barrett's esophagus   . Basal cell carcinoma    "face, arms, hands" (06/25/2017)  . Chicken pox   . Cholelithiasis 11/2016   S/P LAP CHOLE  . Chronic combined systolic and diastolic CHF (congestive heart failure) ([place])   . CKD (chronic kidney disease), stage III ([place])   . Esophageal varices (HCC)    Secondary to NAFLD  . Family history of adverse reaction to anesthesia    "daughter's sat drop very low; last OR had to be reintubated" (06/25/2017)  . GERD (gastroesophageal reflux disease)   . Goals of care, counseling/discussion 10/22/2017  . High cholesterol   . Hypertension   . Kappa light chain myeloma ([place]) 10/22/2017  . Mitral regurgitation   . NAFLD (nonalcoholic fatty liver disease)   . NICM (nonischemic cardiomyopathy) ([place])    Echo 06/2017: Severe LVH.  EF 25 to 30% with mild HK of the baseline after septal wall.  Moderate to severely dilated right atrium, moderately dilated RV. (Concern fr ? amyloidosis).  . NSVT (nonsustained ventricular tachycardia) ([place])   . Pneumonia 09/2016  . Presence of permanent cardiac pacemaker 01/08/2018   . RBBB   . Tricuspid regurgitation   . Type II diabetes mellitus ([place])      Assessment: 77yom intubated 2/27 to protect aairway after having runs VT needing to be shocked.  VT quiet on amiodaone drip - EP following  Milrinone 0.128mg/kg/min for low output Has been on apixaban for anticoaguation in setting of Afib.  Now with AKI Cr 4.4 and in ICU may need IV lines placed - will hold apixaban and start heparin drip.  CBC ok apixaban can fasley elevated the HL will use aptt for heparin dosing until washout / the two correllate.  Goal of Therapy:  Heparin level 0.3-0.7 units/ml Monitor platelets by anticoagulation protocol: Yes   Plan:  Stop apixaban  No bolus Start heparin drip 1050 uts/hr 6hr HL and APTT Daily HL, APTT,CBC Monitor s/s bleeding and renal function  LBonnita NasutiPharm.D. CPP, BCPS Clinical Pharmacist [phone]/06/2018 12:20 PM

## 2018-06-16 NOTE — Progress Notes (Signed)
[place] Progress Note Patient Name: Dillon Andrews DOB: 10/05/1940 MRN: [id number]   Date of Service  06/16/2018  HPI/Events of Note  Intractable bleeding from a discontinued arterial line-bleeding appears pulsatile and has persisted despite pressure dressings and prolonged digital pressure. Concern is for vascular injury.  eICU Interventions  Boots on the ground requested to evaluate bleeding site to determine if vascular surgery is needed for repair.        Kerry Kass Ogan 06/16/2018, 11:53 PM

## 2018-06-16 NOTE — Progress Notes (Addendum)
NAME:  DOUG BUCKLIN, MRN:  [id number], DOB:  [birth year], LOS: 3 ADMISSION DATE:  06/06/2018, CONSULTATION DATE:  06/12/2018 REFERRING MD:  Sela Hua, MD, CHIEF COMPLAINT:  Respiratory failure  Brief History   Mr. Blazier is a 78 YO gentleman with history significant for Chronic systolic HF on home milrinone due to TTR amyloidosis, atrial flutter, pace maker placement due to complete heart block with recent RV lead revision, chronic anticoagulation, NASH with esophageal varices  and CKD who is transferred to the CCU from the floor after he developed multiple episodes of rapid VT with hemodynamic instability requiring intubation and cardioversion  Past Medical History  AFlutter: H/o ablation, known to have multiple circuits, NASH W/esophageal varicies NICM, Chronic CHF On home milrinone, DM, HTN, CKD (III)  Significant Hospital Events   -Endotracheal intubation 2/28 -Right IJ central line 2/28 -Left radial A-line 2/28 -DC cardioversion 2/28 3/3 still on pressors and lasix gtt. Starting PSV trial  Consults:  -PCCM  Procedures:  As above  Significant Diagnostic Tests:  05/22/2018: EPS/ablation:  1. Right atrial flutter upon presentation. 2. Successful radiofrequency ablation of atrial flutter along the cavotricuspid isthmus with complete bidirectional isthmus block achieved. 3. No early apparent complications.   Echo 12.31/2019 - Left ventricle: The cavity size was normal. Wall thickness was   increased in a pattern of severe LVH. Systolic function was   moderately reduced. The estimated ejection fraction was in the   range of 35% to 40%. Diffuse hypokinesis. Doppler parameters are   consistent with a reversible restrictive pattern, indicative of   decreased left ventricular diastolic compliance and/or increased   left atrial pressure (grade 3 diastolic dysfunction). - Ventricular septum: The contour showed diastolic flattening. - Aortic valve: Trileaflet; mildly thickened,  mildly calcified   leaflets. - Mitral valve: There was mild regurgitation. - Left atrium: The atrium was moderately dilated. - Right ventricle: The cavity size was moderately dilated. Wall   thickness was normal. - Right atrium: The atrium was moderately dilated. - Pulmonary arteries: Systolic pressure was moderately to severely   increased. PA peak pressure: 68 mm Hg (S).    11/10/2017: TEE/DCCV - Left ventricle: The estimated ejection fraction was 10%. Diffusehypokinesis. There was spontaneous echo contrast, indicative ofstasis. No evidence of thrombus. - Aortic valve: No evidence of vegetation. - Mitral valve: No evidence of vegetation. There was mild regurgitation. - Left atrium: The atrium was moderately dilated. No evidence of thrombus in the atrial cavity or appendage. No evidence of thrombus in the atrial cavity or appendage. - Right ventricle: Systolic function was severely reduced. - Right atrium: The atrium was dilated. - Atrial septum: No defect or patent foramen ovale was identified. - Tricuspid valve: No evidence of vegetation. There was moderate regurgitation. - Pulmonic valve: No evidence of vegetation. Impressions: - Successful cardioversion. No cardiac source of emboli was indentified.  Micro Data:  -None   Antimicrobials:  -None   Subjective   Currently cycling on pressure support, he appears comfortable on 10 cm water pressure  Objective   Blood pressure (Abnormal) 107/59, pulse 89, temperature 99.6 F (37.6 C), temperature source Oral, resp. rate (Abnormal) 21, height 5' 10"  (1.778 m), weight 85.9 kg, SpO2 94 %. CVP:  [7 mmHg-16 mmHg] 13 mmHg  Vent Mode: CPAP;PSV FiO2 (%):  [40 %] 40 % Set Rate:  [16 bmp] 16 bmp Vt Set:  [580 mL] 580 mL PEEP:  [5 cmH20-8 cmH20] 5 cmH20 Pressure Support:  [10 cmH20] 10 cmH20 Plateau Pressure:  [  16 cmH20-22 cmH20] 17 cmH20   Intake/Output Summary (Last 24 hours) at 06/16/2018 0931 Last data filed at  06/16/2018 0800 Gross per 24 hour  Intake 2760.52 ml  Output 2830 ml  Net -69.48 ml   Filed Weights   06/14/18 0300 06/15/18 0318 06/16/18 0400  Weight: 86.3 kg 88.6 kg 85.9 kg    Examination:  General: This is a pleasant 78 year old male patient currently appears comfortable on mechanical ventilation with pressure support of 10. HEENT: Normocephalic atraumatic no clear jugular venous distention right IJ triple-lumen catheter is unremarkable Pulmonary: Clear to auscultation tidal volumes in the 500 range on pressure support of 10 no accessory use currently Cardiac: Regular rate and rhythm Abdomen: Soft nontender Extremities: Warm and dry Neuro: Awake, follows commands.  Generalized weakness GU: Clear yellow Assessment & Plan:    Acute hypoxic respiratory failure d/t acute pulmonary edema  Portable chest x-ray personally reviewed:Cardiomegaly: Endotracheal tube in satisfactory position.  This maker on left unremarkable.  Right IJ central access catheter is unremarkable.  Bilateral airspace disease consistent with pulmonary edema and probable layering pleural effusions Plan Continue pressure support for now, plan on full ventilator support tonight VAP bundle Continue volume removal, hopefully if we can get more volume off we can work towards extubation Repeat a.m. chest x-ray, if renal function continues to worsen he may require therapeutic thoracentesis in order to liberate from ventilator  Cardiogenic Shock in setting of acute on chronic systolic and diastolic HF H/o TTR amyloid Plan Continuing Lasix drip, milrinone, and norepinephrine per heart failure team Continue telemetry monitoring  Multiple arrhythmias: VT/ fib/flutter Plan Continuing amiodarone  Acute on chronic Kidney disease  Mild metabolic alkalosis  Suspect likely due to venous congestion in the setting of volume overload Plan Ensure mean arterial pressure greater than 65 Maximize cardiac output Renal dose  medications No change in diuresis for now, if this continues to worsen will need to discuss dialysis however given his poor cardiac function I am not sure he is a dialysis candidate certainly not long-term  Hyperglycemia Plan ssi   Anemia w/out evidence of bleeding Plan Trend CBC  Best practice:  Diet: Nutrition consult to initiate tube feeds  Pain/Anxiety/Delirium protocol (if indicated): Fentanyl, Midazolam VAP protocol (if indicated): HoB elevated, PPI, Oral care  DVT prophylaxis: Eliquis GI prophylaxis: Pantoprazole  Glucose control: Maintain euglycemia  Mobility: PT and OT to evaluate and treat as appropriate  Code Status: Full code  Family Communication: Family updated  Disposition: He remains critically ill due to cardiogenic shock.  Ongoing hypoxic respiratory failure in the setting of pulmonary edema.  Chest x-ray is not improved much.  He is not ready for extubation due to his ongoing volume status overload.  We will continue current therapy including maximization of cardiac output markers, continuing to push diuresis, and hope that will be able to tolerate extubation soon.  If his renal function continues to deteriorate he may require dialysis, however I do not think he is a long-term dialysis candidate given his poor cardiac function.   Erick Colace ACNP-BC [facility] Pulmonary/Critical Care Pager # [phone] OR # [phone] if no answer  Attending Note:  78 year old male with extensive CHF history presenting to PCCM with respiratory failure and cardiogenic shock.  Weaning some this AM on exam but shock state prohibits extubation.  I reviewed CXR myself, remains in pulmonary edema.  Discussed with cardiology.  Will continue full vent support.  Avoid stressing cardiac function.  Monitor closely for signs  of infection.  Pressors for BP support and continue diureses.  PCCM will continue to follow.  The patient is critically ill with multiple organ systems failure and requires  high complexity decision making for assessment and support, frequent evaluation and titration of therapies, application of advanced monitoring technologies and extensive interpretation of multiple databases.   Critical Care Time devoted to patient care services described in this note is  35  Minutes. This time reflects time of care of this signee Dr Jennet Maduro. This critical care time does not reflect procedure time, or teaching time or supervisory time of PA/NP/Med student/Med Resident etc but could involve care discussion time.  Rush Farmer, M.D. [facility] Pulmonary/Critical Care Medicine. Pager: [phone]. After hours pager: [phone].

## 2018-06-16 NOTE — Progress Notes (Addendum)
ANTICOAGULATION CONSULT NOTE - Initial Consult  Pharmacy Consult for Heparin -apixaban on hold Indication: atrial fibrillation  No Known Allergies  Patient Measurements: Height: 5' 10"  (177.8 cm) Weight: 189 lb 6 oz (85.9 kg) IBW/kg (Calculated) : 73   Vital Signs: Temp: 99.2 F (37.3 C) (03/03 1615) Temp Source: Oral (03/03 1615) BP: 111/59 (03/03 1900) Pulse Rate: 90 (03/03 1900)  Labs: Recent Labs    06/14/18 0325 06/15/18 0252 06/16/18 0401 06/16/18 0407 06/16/18 1814  HGB  --   --  11.4* 11.9*  --   HCT  --   --  33.8* 35.0*  --   PLT  --   --  192  --   --   APTT  --   --   --   --  124*  CREATININE 3.37* 3.92* 4.41*  --   --     Estimated Creatinine Clearance: 14.3 mL/min (A) (by C-G formula based on SCr of 4.41 mg/dL (H)).   Medical History: Past Medical History:  Diagnosis Date  . Anemia 11/2016  . Arthritis    "some; hands/fingers" (06/25/2017)  . Atrial tachycardia ([place])   . Barrett's esophagus   . Basal cell carcinoma    "face, arms, hands" (06/25/2017)  . Chicken pox   . Cholelithiasis 11/2016   S/P LAP CHOLE  . Chronic combined systolic and diastolic CHF (congestive heart failure) ([place])   . CKD (chronic kidney disease), stage III ([place])   . Esophageal varices ([facility])    Secondary to NAFLD  . Family history of adverse reaction to anesthesia    "daughter's sat drop very low; last OR had to be reintubated" (06/25/2017)  . GERD (gastroesophageal reflux disease)   . Goals of care, counseling/discussion 10/22/2017  . High cholesterol   . Hypertension   . Kappa light chain myeloma ([place]) 10/22/2017  . Mitral regurgitation   . NAFLD (nonalcoholic fatty liver disease)   . NICM (nonischemic cardiomyopathy) ([place])    Echo 06/2017: Severe LVH.  EF 25 to 30% with mild HK of the baseline after septal wall.  Moderate to severely dilated right atrium, moderately dilated RV. (Concern fr ? amyloidosis).  . NSVT (nonsustained ventricular tachycardia) ([place])   .  Pneumonia 09/2016  . Presence of permanent cardiac pacemaker 01/08/2018  . RBBB   . Tricuspid regurgitation   . Type II diabetes mellitus ([place])      Assessment: 77yom intubated 2/27 to protect aairway after having runs VT needing to be shocked.  VT quiet on amiodaone drip - EP following  Milrinone 0.192mg/kg/min for low output Has been on apixaban for anticoaguation in setting of Afib.  Now with AKI Cr 4.4 and in ICU may need IV lines placed - will hold apixaban and start heparin drip.  CBC ok apixaban can fasley elevated the HL will use aptt for heparin dosing until washout / the two correlate.  Initial aPTT elevated at 124, on 1050 units/hr of heparin. No s/sx of bleeding. No infusion issues. Level drawn from A-line and heparin is infusing in R IJ.   Goal of Therapy:  Heparin level 0.3-0.7 units/ml Monitor platelets by anticoagulation protocol: Yes   Plan:  Reduce heparin infusion to 850 units/hr Daily HL, APTT,CBC Monitor s/s bleeding and renal function  KAntonietta Jewel PharmD, BUnion LevelClinical Pharmacist  Pager: [phone]: [phone]/06/2018 7:32 PM   ADDENDUM Having significant bleeding at A-line. Given elevated aPTT in addition to bleeding, will hold heparin infusion for 1 hour then resume at  850 units/hr to allow level to decrease into goal range. Will order heparin level 8 hours after restart.   Antonietta Jewel, PharmD, [place] Clinical Pharmacist

## 2018-06-16 NOTE — Progress Notes (Addendum)
Advanced Heart Failure Rounding Note   Subjective:    Remains intubated. Follows commands   NE @ 20 mcg + milrinone 0.125 mcg + amio 30 mg per hour. CO-OX 77%.   Creatinine trending up to 3.9->4.4. Remains on lasix drip @ 20 mg per hour + metolazone. CVP 11-12     Objective:   Weight Range:  Vital Signs:   Temp:  [98.7 F (37.1 C)-99.1 F (37.3 C)] 98.9 F (37.2 C) (03/03 0400) Pulse Rate:  [83-125] 95 (03/03 0700) Resp:  [14-21] 21 (03/03 0700) BP: (92-111)/(48-70) 101/58 (03/03 0700) SpO2:  [92 %-96 %] 94 % (03/03 0700) Arterial Line BP: (96-115)/(41-53) 103/50 (03/03 0700) FiO2 (%):  [40 %] 40 % (03/03 0410) Weight:  [85.9 kg] 85.9 kg (03/03 0400) Last BM Date: 06/10/2018  Weight change: Filed Weights   06/14/18 0300 06/15/18 0318 06/16/18 0400  Weight: 86.3 kg 88.6 kg 85.9 kg    Intake/Output:   Intake/Output Summary (Last 24 hours) at 06/16/2018 0709 Last data filed at 06/16/2018 0700 Gross per 24 hour  Intake 2865 ml  Output 3005 ml  Net -140 ml    CVP 11-12 Physical Exam: General:   No resp difficulty HEENT: normal ETT Neck: supple. JVP 11-12 . Carotids 2+ bilat; no bruits. No lymphadenopathy or thryomegaly appreciated. RIJ R subclavian Cor: PMI nondisplaced. Regular rate & rhythm. No rubs, gallops or murmurs. Lungs: Rhonchi throughout Abdomen: soft, nontender, nondistended. No hepatosplenomegaly. No bruits or masses. Good bowel sounds. Extremities: no cyanosis, clubbing, rash, edema Neuro:  Awake follows commands. MAE x4.  GU: Foley yellow urine.    Telemetry: A sensed V paced 90s    Labs: Basic Metabolic Panel: Recent Labs  Lab 06/12/18 0557  06/13/18 0427 06/13/18 1634 06/14/18 0325 06/14/18 1709 06/15/18 0252 06/16/18 0401 06/16/18 0407  NA 135   < > 135  --  133*  --  133* 132* 131*  K 4.2   < > 4.3  --  4.1  --  3.7 3.7 3.8  CL 96*  --  96*  --  96*  --  93* 90*  --   CO2 25  --  24  --  23  --  28 26  --   GLUCOSE 140*  --   153*  --  172*  --  191* 209*  --   BUN 28*  --  35*  --  48*  --  63* 81*  --   CREATININE 2.06*  --  2.62*  --  3.37*  --  3.92* 4.41*  --   CALCIUM 8.9  --  8.9  --  8.4*  --  7.8* 8.3*  --   MG 1.9   < > 2.5* 2.4 2.4 2.4  --  2.2  --   PHOS  --    < > 5.7* 5.3* 5.1* 4.3  --  3.3  --    < > = values in this interval not displayed.    Liver Function Tests: Recent Labs  Lab 06/12/18 2158  AST 65*  ALT 30  ALKPHOS 147*  BILITOT 1.7*  PROT 6.1*  ALBUMIN 2.9*   No results for input(s): LIPASE, AMYLASE in the last 168 hours. No results for input(s): AMMONIA in the last 168 hours.  CBC: Recent Labs  Lab 06/12/18 2023 06/12/18 2158 06/13/18 0427 06/16/18 0401 06/16/18 0407  WBC  --  12.3* 13.1* 12.7*  --   NEUTROABS  --  9.6*  --   --   --  HGB 15.0 14.0 14.6 11.4* 11.9*  HCT 44.0 44.0 45.9 33.8* 35.0*  MCV  --  98.9 98.9 93.6  --   PLT  --  217 240 192  --     Cardiac Enzymes: No results for input(s): CKTOTAL, CKMB, CKMBINDEX, TROPONINI in the last 168 hours.  BNP: BNP (last 3 results) Recent Labs    11/05/17 1310 12/02/17 1846 01/05/18 1431  BNP 515.3* 349.6* 516.7*    ProBNP (last 3 results) No results for input(s): PROBNP in the last 8760 hours.    Other results:  Imaging: Dg Chest 1 View  Result Date: 06/15/2018 CLINICAL DATA:  Increased tracheal secretions. EXAM: CHEST  1 VIEW, unable to remove metallic device over left shoulder. COMPARISON:  06/12/2018 FINDINGS: Endotracheal tube has tip 3.6 cm above the carina. Nasogastric tube courses into the region of the stomach as tip is not visualized. Dual lead left-sided cardiac pacemaker unchanged. Two right-sided central venous catheters unchanged with tips over the SVC. Metallic device over the left shoulder obscures the periphery of the left upper lung. Lungs are adequately inflated with mild hazy bibasilar opacification likely small layering effusions with associated basilar atelectasis. Findings are not  significantly changed. Mild hazy appearance of the perihilar markings suggesting mild interstitial edema. Stable cardiomegaly. Remainder of the exam is unchanged. IMPRESSION: Stable cardiomegaly with evidence of mild stable interstitial edema and small bilateral pleural effusions with associated bibasilar atelectasis. Infection in the mid to lower lungs is possible. Tubes and lines as described. Electronically Signed   By: Marin Olp M.D.   On: 06/15/2018 10:16     Medications:     Scheduled Medications: . apixaban  5 mg Oral BID  . chlorhexidine gluconate (MEDLINE KIT)  15 mL Mouth Rinse BID  . Chlorhexidine Gluconate Cloth  6 each Topical Q0600  . feeding supplement (PRO-STAT SUGAR FREE 64)  30 mL Per Tube TID  . feeding supplement (VITAL 1.5 CAL)  1,000 mL Per Tube Q24H  . gabapentin  400 mg Oral QHS  . insulin aspart  0-9 Units Subcutaneous Q4H  . magnesium oxide  400 mg Oral QHS  . mouth rinse  15 mL Mouth Rinse 10 times per day  . Melatonin  6 mg Oral QHS  . metolazone  5 mg Oral BID  . multivitamin with minerals  1 tablet Oral Daily  . ondansetron  4 mg Oral BID  . pantoprazole sodium  40 mg Per Tube Daily  . rosuvastatin  10 mg Oral Daily  . sertraline  25 mg Oral Daily  . sodium chloride flush  10-40 mL Intracatheter Q12H  . tamsulosin  0.4 mg Oral Daily  . traZODone  50 mg Oral QHS  . vitamin B-12  500 mcg Oral QODAY  . zolpidem  5 mg Oral QHS    Infusions: . sodium chloride 5 mL/hr at 06/16/18 0700  . amiodarone 30 mg/hr (06/16/18 0700)  . furosemide (LASIX) infusion 20 mg/hr (06/16/18 0700)  . midazolam Stopped (06/13/18 0700)  . milrinone 0.125 mcg/kg/min (06/16/18 0700)  . norepinephrine (LEVOPHED) Adult infusion 20 mcg/min (06/16/18 0700)    PRN Medications: acetaminophen, alum & mag hydroxide-simeth, fentaNYL (SUBLIMAZE) injection, hydrocortisone, ondansetron (ZOFRAN) IV, promethazine, sodium chloride flush, traMADol   Assessment:   Dillon Andrews  a 77 y.o.malewith a hx of chronic combined CHF due to NICM with probable TTR amyloid, CKD III,presumedNASH(per pt, never proven),esophageal varices,  RBBB, PAF requiring multiple DCCV and now s/p atrial flutter ablation 05/22/18. He  is on home milrinone 0.125 followed by [facility].   He was admitted for RV lead revision. Admission complicated by recurrent HF in setting of AF with RVR. On 2/28 decompensated with multiple episodes of VT complicated by respiratory failure leading to intubation and DC-CV   Plan/Discussion:    1. Acute on chronic systolic HF -> cardiogenic shock -NICM by cath at [facility] 09/2016 -Echo 06/2017: EF 25-30%, mild LAE, mild/mod RV dilation, mod/severe RA. Echo 10/2017: EF 10% -Echo12/19 EF 35-40%. He has been on home milrinone -PYP scan strongly suggestive of TTR amyloid, SPEP with 0.8% m-spike, UPEP with Bence Jones Protein positive, kappa type. 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 Dr Marin Olp saw and thinks he does not have AL amyloid (just multiple myeloma) and that he has TTR amyloid and should be treated with tafamidis before chemo. Tafamidis started on 11/14/17.  - Decompensated in setting of recurrent AF and VT - NE at 20 mcg. CO-OX 77%. Course c/b ATN and cardiorenal syndrome -  CVP 11-12.  -Continue lasix gtt at 20 + metolazone 5 mg twice a day.  Creatinine continues to rise. May need to hold diuretics.  - No ARB/entresto/spirowith for now with CKD - Has been off hydralazine/imdur. No b-blocker with low output  2.Paroxysmal  AtrialFlutter/Fib - s/p DCCV of 7/31, 8/2, 12/19/2017  -  s/p aflutter ablation 2/7 -  had recurrent AF on admit. S/p emergent DC-CV 2/28 - On 2/29 in junctional rhythm. Pacer now set DD at 90 to force AV pacing. -  Continue IV amio.  Failedpo amiodarone previously with recurrent nausea. But does not tolerate AF well and will need AA drug -Continue Eliquis 5 mg BID   3. VT - improved with IV lidocaine on 2/27 - now quiescent  on IV amio - Keep K> 4.0 mg > 2.0  4. Acute hypoxic respiratory failure - Stable on vent - CCM following    5. h/o junctional rhythm with symptomatic bradycardia -> MDT PPM.  -RV lead revised this admission. Currently stable - EP following  6. Acute on chronic renal failure - likely due to ATN and cardiorenal syndrome - baseline creatinine 1.6-1.8 - Creatinine continues to rise-->3.9-->4.4 . -Support renal perfusion with inotropes. Diurese as able.  - Will need nephrology   7. 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 Marin Olp  -Started on Bortezomib7/18/18andRevlimid7/23/19. - Dr Marin Olp thinks he does not have AL amyloid and that he does have TTR amyloid and should be treated with tafamidis before chemo. ContinueTafamidis.  - Off tafamdis currently as pills can't be crushed  Length of Stay: 3   Amy Clegg NP-C  06/16/2018, 7:09 AM  Advanced Heart Failure Team Pager [phone] (M-F; 7a - 4p)  Please contact [facility] for night-coverage after hours (4p -7a ) and weekends on [email or web address]  Agree with above  Remains intubated on vent. Awake and follows commands. Remains on NE 20 and milrinone 0.125. Co-ox ox. CVP remains elevated 13-14. Remains In NSR  On exam. Awake on vent CVP up Cor RRR Lung coarse Ab distended Ext warm 1+ edema .+Foley   Renal function continues to worsen. Fortunately urine output good on IV lasix and metolazone. Currently no indication for CVVHD. D/w CCM - not ready for extubation yet. If creatinine continues to worsen may need Renal to see for CVVHD. No acute indications for HD now.  CRITICAL CARE Performed by: Glori Bickers  Total critical care time: 35 minutes  Critical care time was  exclusive of separately billable procedures and treating other patients.  Critical care was necessary to treat or prevent imminent or life-threatening deterioration.  Critical care was time spent personally by me  (independent of midlevel providers or residents) on the following activities: development of treatment plan with patient and/or surrogate as well as nursing, discussions with consultants, evaluation of patient's response to treatment, examination of patient, obtaining history from patient or surrogate, ordering and performing treatments and interventions, ordering and review of laboratory studies, ordering and review of radiographic studies, pulse oximetry and re-evaluation of patient's condition.   Glori Bickers, MD  2:11 PM

## 2018-06-16 NOTE — Progress Notes (Signed)
Inpatient Diabetes Program Recommendations  AACE/ADA: New Consensus Statement on Inpatient Glycemic Control (2015)  Target Ranges:  Prepandial:   less than 140 mg/dL      Peak postprandial:   less than 180 mg/dL (1-2 hours)      Critically ill patients:  140 - 180 mg/dL   Lab Results  Component Value Date   GLUCAP 184 (H) 06/16/2018   HGBA1C 6.7 (H) 12/12/2017    Review of Glycemic Control Results for ERICKSON, YAMASHIRO" (MRN [id number]) as of 06/16/2018 10:31  Ref. Range 06/15/2018 19:49 06/15/2018 23:23 06/16/2018 04:04 06/16/2018 08:18  Glucose-Capillary Latest Ref Range: 70 - 99 mg/dL 199 (H) 190 (H) 196 (H) 184 (H)   Diabetes history: DM 2 Outpatient Diabetes medications: None Current orders for Inpatient glycemic control:  Novolog sensitive q 4 hours Inpatient Diabetes Program Recommendations:   May consider adding Novolog tube feed coverage 2 units q 4 hours (hold if tube feeds held).   Thanks,  Adah Perl, RN, BC-ADM Inpatient Diabetes Coordinator Pager [phone] (8a-5p)

## 2018-06-17 ENCOUNTER — Inpatient Hospital Stay (HOSPITAL_COMMUNITY): Payer: Medicare HMO

## 2018-06-17 LAB — URINALYSIS, ROUTINE W REFLEX MICROSCOPIC
Bilirubin Urine: NEGATIVE
GLUCOSE, UA: NEGATIVE mg/dL
Ketones, ur: NEGATIVE mg/dL
NITRITE: NEGATIVE
Protein, ur: NEGATIVE mg/dL
RBC / HPF: >50 RBC/hpf — ABNORMAL HIGH (ref 0–5)
Specific Gravity, Urine: 1.009 (ref 1.005–1.030)
pH: 5 (ref 5.0–8.0)

## 2018-06-17 LAB — GLUCOSE, CAPILLARY
GLUCOSE-CAPILLARY: 192 mg/dL — AB (ref 70–99)
Glucose-Capillary: 214 mg/dL — ABNORMAL HIGH (ref 70–99)
Glucose-Capillary: 168 mg/dL — ABNORMAL HIGH (ref 70–99)
Glucose-Capillary: 171 mg/dL — ABNORMAL HIGH (ref 70–99)
Glucose-Capillary: 148 mg/dL — ABNORMAL HIGH (ref 70–99)
Glucose-Capillary: 187 mg/dL — ABNORMAL HIGH (ref 70–99)

## 2018-06-17 LAB — BASIC METABOLIC PANEL
Anion gap: 14 (ref 5–15)
BUN: 96 mg/dL — ABNORMAL HIGH (ref 8–23)
CO2: 30 mmol/L (ref 22–32)
Calcium: 8.1 mg/dL — ABNORMAL LOW (ref 8.9–10.3)
Chloride: 88 mmol/L — ABNORMAL LOW (ref 98–111)
Creatinine, Ser: 5.02 mg/dL — ABNORMAL HIGH (ref 0.61–1.24)
GFR calc Af Amer: 12 mL/min — ABNORMAL LOW (ref >60)
GFR calc non Af Amer: 10 mL/min — ABNORMAL LOW (ref >60)
Glucose, Bld: 209 mg/dL — ABNORMAL HIGH (ref 70–99)
Potassium: 3.3 mmol/L — ABNORMAL LOW (ref 3.5–5.1)
Sodium: 132 mmol/L — ABNORMAL LOW (ref 135–145)

## 2018-06-17 LAB — CBC
HCT: 31.4 % — ABNORMAL LOW (ref 39.0–52.0)
HEMATOCRIT: 32 % — AB (ref 39.0–52.0)
Hemoglobin: 11 g/dL — ABNORMAL LOW (ref 13.0–17.0)
Hemoglobin: 10.7 g/dL — ABNORMAL LOW (ref 13.0–17.0)
MCH: 31.9 pg (ref 26.0–34.0)
MCH: 31.8 pg (ref 26.0–34.0)
MCHC: 34.4 g/dL (ref 30.0–36.0)
MCHC: 34.1 g/dL (ref 30.0–36.0)
MCV: 92.8 fL (ref 80.0–100.0)
MCV: 93.5 fL (ref 80.0–100.0)
NRBC: 0 % (ref 0.0–0.2)
Platelets: 184 10*3/uL (ref 150–400)
Platelets: 171 10*3/uL (ref 150–400)
RBC: 3.45 MIL/uL — ABNORMAL LOW (ref 4.22–5.81)
RBC: 3.36 MIL/uL — ABNORMAL LOW (ref 4.22–5.81)
RDW: 19.4 % — ABNORMAL HIGH (ref 11.5–15.5)
RDW: 19.4 % — ABNORMAL HIGH (ref 11.5–15.5)
WBC: 10.2 10*3/uL (ref 4.0–10.5)
WBC: 9.4 10*3/uL (ref 4.0–10.5)
nRBC: 0 % (ref 0.0–0.2)

## 2018-06-17 LAB — HEPATIC FUNCTION PANEL
ALT: 14 U/L (ref 0–44)
AST: 49 U/L — ABNORMAL HIGH (ref 15–41)
Albumin: 2.1 g/dL — ABNORMAL LOW (ref 3.5–5.0)
Alkaline Phosphatase: 159 U/L — ABNORMAL HIGH (ref 38–126)
Bilirubin, Direct: 0.6 mg/dL — ABNORMAL HIGH (ref 0.0–0.2)
Indirect Bilirubin: 0.3 mg/dL (ref 0.3–0.9)
Total Bilirubin: 0.9 mg/dL (ref 0.3–1.2)
Total Protein: 5.3 g/dL — ABNORMAL LOW (ref 6.5–8.1)

## 2018-06-17 LAB — COOXEMETRY PANEL
Carboxyhemoglobin: 1.4 % (ref 0.5–1.5)
Methemoglobin: 1 % (ref 0.0–1.5)
O2 SAT: 84.1 %
Total hemoglobin: 11.2 g/dL — ABNORMAL LOW (ref 12.0–16.0)

## 2018-06-17 LAB — NA AND K (SODIUM & POTASSIUM), RAND UR
Potassium Urine: 35 mmol/L
Sodium, Ur: 77 mmol/L

## 2018-06-17 LAB — APTT
aPTT: 56 seconds — ABNORMAL HIGH (ref 24–36)
aPTT: 93 seconds — ABNORMAL HIGH (ref 24–36)

## 2018-06-17 LAB — PROTIME-INR
INR: 2.5 — ABNORMAL HIGH (ref 0.8–1.2)
Prothrombin Time: 26.7 seconds — ABNORMAL HIGH (ref 11.4–15.2)

## 2018-06-17 LAB — HEPARIN LEVEL (UNFRACTIONATED): Heparin Unfractionated: >2.2 IU/mL — ABNORMAL HIGH (ref 0.30–0.70)

## 2018-06-17 MED ORDER — POTASSIUM CHLORIDE 10 MEQ/50ML IV SOLN
10.0000 meq | INTRAVENOUS | Status: AC
  Administered 2018-06-17 (×2): 10 meq via INTRAVENOUS
  Filled 2018-06-17 (×2): qty 50

## 2018-06-17 MED ORDER — POTASSIUM CHLORIDE 10 MEQ/100ML IV SOLN
10.0000 meq | INTRAVENOUS | Status: DC
  Administered 2018-06-17: 10 meq via INTRAVENOUS
  Filled 2018-06-17 (×3): qty 100

## 2018-06-17 NOTE — Progress Notes (Signed)
[place] for Heparin -apixaban on hold Indication: atrial fibrillation  No Known Allergies  Patient Measurements: Height: 5' 10"  (177.8 cm) Weight: 189 lb 6 oz (85.9 kg) IBW/kg (Calculated) : 73   Vital Signs: Temp: 98.9 F (37.2 C) (03/04 0000) Temp Source: Axillary (03/04 0000) BP: 101/61 (03/04 0100) Pulse Rate: 91 (03/04 0100)  Labs: Recent Labs    06/14/18 0325 06/15/18 0252  06/16/18 0401 06/16/18 0407 06/16/18 1814 06/16/18 2324 06/17/18 0026  HGB  --   --    < > 11.4* 11.9*  --  11.0*  --   HCT  --   --   --  33.8* 35.0*  --  32.0*  --   PLT  --   --   --  192  --   --  184  --   APTT  --   --   --   --   --  124* 56*  --   LABPROT  --   --   --   --   --   --   --  26.7*  INR  --   --   --   --   --   --   --  2.5*  HEPARINUNFRC  --   --   --   --   --  >2.20*  --   --   CREATININE 3.37* 3.92*  --  4.41*  --   --   --   --    < > = values in this interval not displayed.    Estimated Creatinine Clearance: 14.3 mL/min (A) (by C-G formula based on SCr of 4.41 mg/dL (H)).   Assessment: 77yom intubated 2/27 to protect aairway after having runs VT needing to be shocked.  VT quiet on amiodaone drip - EP following  Milrinone 0.158mg/kg/min for low output Has been on apixaban for anticoaguation in setting of Afib.  Now with AKI Cr 4.4 and in ICU may need IV lines placed - will hold apixaban and start heparin drip.  CBC ok. Apixaban can fasley elevated the HL will use aptt for heparin dosing until washout / the two correlate.  Bleeding from discontinued a line.  Heparin was held at 21:30.   Bleeding has now resolved.  Will restart heparin  Goal of Therapy:  Heparin level 0.3-0.7 units/ml Monitor platelets by anticoagulation protocol: Yes   Plan:  Restart heparin infusion to 850 units/hr Daily HL, APTT,CBC Monitor s/s bleeding and renal function  Thanks for allowing pharmacy to be a part of this patient's  care.  LExcell Seltzer PharmD Clinical Pharmacist

## 2018-06-17 NOTE — Progress Notes (Addendum)
Progress Note  Patient Name: Dillon Andrews Date of Encounter: 06/17/2018  Primary Cardiologist: Glenetta Hew, MD  AHF: Dr. [name] EP: Dr. Curt Bears  Subjective   Awake, remains intubated.  Answers "yes"  "no", by shaking/nodding  Inpatient Medications    Scheduled Meds: . chlorhexidine gluconate (MEDLINE KIT)  15 mL Mouth Rinse BID  . Chlorhexidine Gluconate Cloth  6 each Topical Q0600  . feeding supplement (PRO-STAT SUGAR FREE 64)  30 mL Per Tube TID  . feeding supplement (VITAL 1.5 CAL)  1,000 mL Per Tube Q24H  . gabapentin  400 mg Oral QHS  . insulin aspart  0-9 Units Subcutaneous Q4H  . mouth rinse  15 mL Mouth Rinse 10 times per day  . Melatonin  6 mg Oral QHS  . multivitamin with minerals  1 tablet Oral Daily  . ondansetron  4 mg Oral BID  . pantoprazole sodium  40 mg Per Tube Daily  . rosuvastatin  10 mg Oral Daily  . sertraline  25 mg Oral Daily  . sodium chloride flush  10-40 mL Intracatheter Q12H  . tamsulosin  0.4 mg Oral Daily  . traZODone  50 mg Oral QHS  . vitamin B-12  500 mcg Oral QODAY  . zolpidem  5 mg Oral QHS   Continuous Infusions: . sodium chloride 5 mL/hr at 06/17/18 0600  . amiodarone 30 mg/hr (06/17/18 0600)  . heparin 850 Units/hr (06/17/18 0600)  . midazolam 0.5 mg/hr (06/17/18 1052)  . norepinephrine (LEVOPHED) Adult infusion 13 mcg/min (06/17/18 0832)   PRN Meds: acetaminophen, alum & mag hydroxide-simeth, fentaNYL (SUBLIMAZE) injection, hydrocortisone, ondansetron (ZOFRAN) IV, promethazine, sodium chloride flush, traMADol   Vital Signs    Vitals:   06/17/18 0715 06/17/18 0756 06/17/18 0800 06/17/18 0830  BP: 107/62 107/62 106/63 106/65  Pulse: 89 90 89 87  Resp: 17 (!) 33 17 20  Temp:      TempSrc:      SpO2: 96% 94% 95% 96%  Weight:      Height:        Intake/Output Summary (Last 24 hours) at 06/17/2018 1115 Last data filed at 06/17/2018 1052 Gross per 24 hour  Intake 2213.37 ml  Output 2435 ml  Net -221.63 ml   Last  3 Weights 06/17/2018 06/16/2018 06/15/2018  Weight (lbs) 188 lb 15 oz 189 lb 6 oz 195 lb 5.2 oz  Weight (kg) 85.7 kg 85.9 kg 88.6 kg      Telemetry    V paced,  Personally Reviewed  ECG    No new EKGs  - Personally Reviewed  Physical Exam   GEN: No acute distress.  Intubated, awake Neck: No JVD Cardiac: RRR (Paced) no murmurs, rubs, or gallops.  Respiratory: clear anterior b/l GI: Soft, nontender, non-distended  MS: 1++ ankle/feet edema, RUE very edematous, LUE wrapped forearm. Neuro:  unable to assess completely Psych: difficult to assess  L chest, site: dry, small hematoma  Labs    Chemistry Recent Labs  Lab 06/12/18 2158  06/15/18 0252 06/16/18 0401 06/16/18 0407 06/17/18 0440 06/17/18 0913  NA  --    < > 133* 132* 131* 132*  --   K  --    < > 3.7 3.7 3.8 3.3*  --   CL  --    < > 93* 90*  --  88*  --   CO2  --    < > 28 26  --  30  --   GLUCOSE  --    < >  191* 209*  --  209*  --   BUN  --    < > 63* 81*  --  96*  --   CREATININE  --    < > 3.92* 4.41*  --  5.02*  --   CALCIUM  --    < > 7.8* 8.3*  --  8.1*  --   PROT 6.1*  --   --   --   --   --  5.3*  ALBUMIN 2.9*  --   --   --   --   --  2.1*  AST 65*  --   --   --   --   --  49*  ALT 30  --   --   --   --   --  14  ALKPHOS 147*  --   --   --   --   --  159*  BILITOT 1.7*  --   --   --   --   --  0.9  GFRNONAA  --    < > 14* 12*  --  10*  --   GFRAA  --    < > 16* 14*  --  12*  --   ANIONGAP  --    < > 12 16*  --  14  --    < > = values in this interval not displayed.     Hematology Recent Labs  Lab 06/16/18 0401 06/16/18 0407 06/16/18 2324 06/17/18 0440  WBC 12.7*  --  10.2 9.4  RBC 3.61*  --  3.45* 3.36*  HGB 11.4* 11.9* 11.0* 10.7*  HCT 33.8* 35.0* 32.0* 31.4*  MCV 93.6  --  92.8 93.5  MCH 31.6  --  31.9 31.8  MCHC 33.7  --  34.4 34.1  RDW 19.2*  --  19.4* 19.4*  PLT 192  --  184 171    Cardiac EnzymesNo results for input(s): TROPONINI in the last 168 hours. No results for input(s):  TROPIPOC in the last 168 hours.   BNPNo results for input(s): BNP, PROBNP in the last 168 hours.   DDimer No results for input(s): DDIMER in the last 168 hours.   Radiology    Dg Chest Port 1 View Result Date: 06/17/2018 CLINICAL DATA:  Check endotracheal tube placement EXAM: PORTABLE CHEST 1 VIEW COMPARISON:  06/16/2018 FINDINGS: Cardiac shadow remains enlarged. Pacing device is again seen. Endotracheal tube and gastric catheter are noted although the proximal side port of the gastric catheter lies at the level of the gastroesophageal junction but stable. Right jugular central lines are noted in the distal superior vena cava. Bilateral pleural effusions are seen and stable. Bibasilar atelectatic changes and left lower lobe consolidation is noted. Central vascular congestion is again seen. IMPRESSION: Changes of CHF with bilateral effusions. Bibasilar atelectasis and left retrocardiac consolidation is seen. Tubes and lines as described stable from the prior exam. Electronically Signed   By: Inez Catalina M.D.   On: 06/17/2018 08:38     Cardiac Studies    05/22/2018: EPS/ablation CONCLUSIONS:   1. Right atrial flutter upon presentation.   2. Successful radiofrequency ablation of atrial flutter along the cavotricuspid isthmus with complete bidirectional isthmus block achieved.   3. No early apparent complications.    11/10/2017: TEE/DCCV Study Conclusions - Left ventricle: The estimated ejection fraction was 10%. Diffuse   hypokinesis. There was spontaneous echo contrast, indicative of   stasis. No evidence of thrombus. - Aortic valve:  No evidence of vegetation. - Mitral valve: No evidence of vegetation. There was mild   regurgitation. - Left atrium: The atrium was moderately dilated. No evidence of   thrombus in the atrial cavity or appendage. No evidence of   thrombus in the atrial cavity or appendage. - Right ventricle: Systolic function was severely reduced. - Right atrium: The  atrium was dilated. - Atrial septum: No defect or patent foramen ovale was identified. - Tricuspid valve: No evidence of vegetation. There was moderate   regurgitation. - Pulmonic valve: No evidence of vegetation. Impressions: - Successful cardioversion. No cardiac source of emboli was   indentified.  Patient Profile     79 y.o. 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 RV lead dislodgement     S/p RV lead revision 05/20/2018, Dr. Curt Bears     Small hematoma at site, monitor      2. AFlutter, Afib     Known to have a umber of flutters, typical flutter ablated earlier this month     CHA2DS2Vasc is 5, on Eliquis out patient >> heparin gtt      Look with his device notes he remains in an Aflutter though w/CVR Vpacing       3. NICM 4. Chronic CHF combined HF with TTR amyloidosis (wild type)     On home milrinone     Acute decompensation     Pt with 2 days of worsening orthopnea leading into hospitalization 5. Shock     Decompensated post-op day #1 (likely tipped over with the AF)  Remains intubated AHF team managing Still on levo, though less  Milrinone Amiodarone gtt      6. CKD (III) > ARF     Creat continues to rise     + urine OP     Nephrology on board, no CRRT so far   7. LUE hematoma (A-line bleed)     I don't see vascular note,      This AM RN reports that vascular apparently saw the patient bedside last evening, placed pressure dressing, heparin initially held, though has been restarted     Defer to CCM, vascular service        For questions or updates, please contact [facility] Please consult [email or web address] for contact info under        Signed, Baldwin Jamaica, PA-C  06/17/2018, 11:15 AM    I have seen and examined this patient with [name].  Agree with above, note added to  reflect my findings.  On exam, RRR, no murmrs, lungs clear. Continued intubation. Able to nod yes and no. No acute distress. Has gone back into an atrial arrhythmia, flutter on pacemaker. With volume overload and levo, likely difficult for rhythm control. Would continue with current management and Jacquita Mulhearn plan for cardioversion once volume and BP improved. He may be able to be paced out of this arrhythmia.    Elizabella Nolet M. Laquesha Holcomb MD 06/17/2018 2:02 PM

## 2018-06-17 NOTE — Consult Note (Addendum)
[facility] Nephrology Consultation Note  Requesting MD: Dr [name] Reason for consult: AKI on CKD  HPI:  Dillon Andrews is a 78 y.o. male.  With hypertension, type 2 diabetes, possible myeloid, presumed NASH, esophageal varices, A. fib required DCCV and atrial flutter ablation, CHF on home milrinone, CKD stage III with serum creatinine level 1.4-1.7 at baseline, presented for right ventricular lead revision, admission complicated by decompensated heart failure with multiple episode of V. tach and respiratory failure.  Patient was intubated.  Started on milrinone, Levophed and Lasix drip for cardiogenic shock.  Today it is renal function worsen therefore we are consulted for evaluation.  The serum creatinine level was 2.06 on admission.  The creatinine level gradually worsening to 5.02 today.  The Lasix and milrinone was discontinued this morning.  Patient is currently on Levophed.  He had a urine output of 2.2 L in the last 24 hours.  Patient follows with Dr. Marin Olp for kappa lambda chain myeloma and was a started on bortezomib and Revlimid in July 2019.  Patient is currently intubated however alert and following simple commands.  His son is at bedside. He takes torsemide, potassium chloride, milrinone at home.  Creatinine  Date/Time Value Ref Range Status  05/19/2018 10:21 AM 1.66 (H) 0.61 - 1.24 mg/dL Final  02/17/2018 11:41 AM 1.60 (H) 0.60 - 1.20 mg/dL Final  12/16/2017 02:48 PM 1.80 (H) 0.60 - 1.20 mg/dL Final  10/30/2017 12:57 PM 1.30 (H) 0.60 - 1.20 mg/dL Final  10/22/2017 11:55 AM 1.52 (H) 0.61 - 1.24 mg/dL Final  09/15/2017 11:56 AM 1.64 (H) 0.70 - 1.30 mg/dL Final   Creatinine, Ser  Date/Time Value Ref Range Status  06/17/2018 04:40 AM 5.02 (H) 0.61 - 1.24 mg/dL Corrected  06/16/2018 04:01 AM 4.41 (H) 0.61 - 1.24 mg/dL Final  06/15/2018 02:52 AM 3.92 (H) 0.61 - 1.24 mg/dL Final  06/14/2018 03:25 AM 3.37 (H) 0.61 - 1.24 mg/dL Final  06/13/2018 04:27 AM  2.62 (H) 0.61 - 1.24 mg/dL Final  06/12/2018 05:57 AM 2.06 (H) 0.61 - 1.24 mg/dL Final  05/29/2018 11:16 AM 1.47 (H) 0.76 - 1.27 mg/dL Final  05/23/2018 05:57 AM 1.43 (H) 0.61 - 1.24 mg/dL Final  05/13/2018 03:35 PM 1.77 (H) 0.61 - 1.24 mg/dL Final  01/09/2018 03:32 AM 1.76 (H) 0.61 - 1.24 mg/dL Final  01/08/2018 02:34 AM 1.89 (H) 0.61 - 1.24 mg/dL Final  01/07/2018 05:48 AM 1.98 (H) 0.61 - 1.24 mg/dL Final  01/06/2018 02:27 PM 2.11 (H) 0.61 - 1.24 mg/dL Final  01/05/2018 02:31 PM 2.04 (H) 0.61 - 1.24 mg/dL Final  12/12/2017 12:13 PM 1.68 (H) 0.40 - 1.50 mg/dL Final  12/04/2017 05:00 AM 1.32 (H) 0.61 - 1.24 mg/dL Final  12/03/2017 05:51 PM 1.47 (H) 0.61 - 1.24 mg/dL Final  12/03/2017 03:27 AM 1.44 (H) 0.61 - 1.24 mg/dL Final  12/02/2017 06:50 PM 1.51 (H) 0.61 - 1.24 mg/dL Final  11/27/2017 03:00 AM 1.32 (H) 0.61 - 1.24 mg/dL Final  11/26/2017 02:10 AM 1.36 (H) 0.61 - 1.24 mg/dL Final  11/25/2017 03:32 AM 1.34 (H) 0.61 - 1.24 mg/dL Final  11/24/2017 01:58 AM 1.53 (H) 0.61 - 1.24 mg/dL Final  11/23/2017 07:18 AM 1.66 (H) 0.61 - 1.24 mg/dL Final  11/22/2017 02:30 AM 1.50 (H) 0.61 - 1.24 mg/dL Final  11/21/2017 07:29 AM 1.67 (H) 0.61 - 1.24 mg/dL Final  11/20/2017 05:00 AM 2.06 (H) 0.61 - 1.24 mg/dL Final  11/19/2017 04:01 AM 2.68 (H) 0.61 - 1.24 mg/dL Final  11/18/2017  04:09 AM 2.75 (H) 0.61 - 1.24 mg/dL Final  11/17/2017 04:36 AM 1.79 (H) 0.61 - 1.24 mg/dL Final  11/16/2017 04:21 AM 1.78 (H) 0.61 - 1.24 mg/dL Final  11/15/2017 04:06 AM 1.76 (H) 0.61 - 1.24 mg/dL Final  11/14/2017 05:00 AM 1.76 (H) 0.61 - 1.24 mg/dL Final  11/13/2017 05:00 AM 1.93 (H) 0.61 - 1.24 mg/dL Final  11/12/2017 06:10 AM 1.77 (H) 0.61 - 1.24 mg/dL Final  11/11/2017 05:46 AM 1.69 (H) 0.61 - 1.24 mg/dL Final  11/10/2017 05:30 AM 1.61 (H) 0.61 - 1.24 mg/dL Final  11/09/2017 05:16 AM 1.71 (H) 0.61 - 1.24 mg/dL Final  11/08/2017 01:50 PM 1.89 (H) 0.61 - 1.24 mg/dL Final  11/08/2017 06:15 AM 1.53 (H) 0.61 - 1.24  mg/dL Final  11/07/2017 05:05 AM 1.63 (H) 0.61 - 1.24 mg/dL Final  11/06/2017 06:36 AM 1.46 (H) 0.61 - 1.24 mg/dL Final  11/05/2017 12:12 PM 1.54 (H) 0.61 - 1.24 mg/dL Final  09/23/2017 07:12 AM 1.41 (H) 0.61 - 1.24 mg/dL Final  08/18/2017 11:02 AM 1.71 (H) 0.40 - 1.50 mg/dL Final  07/23/2017 04:09 PM 1.71 (H) 0.61 - 1.24 mg/dL Final  07/13/2017 04:16 AM 1.90 (H) 0.61 - 1.24 mg/dL Final  07/12/2017 01:47 AM 2.10 (H) 0.61 - 1.24 mg/dL Final  07/11/2017 05:44 AM 2.03 (H) 0.61 - 1.24 mg/dL Final  07/10/2017 12:31 PM 1.98 (H) 0.61 - 1.24 mg/dL Final  07/04/2017 01:57 PM 1.83 (H) 0.61 - 1.24 mg/dL Final  06/29/2017 06:35 AM 1.73 (H) 0.61 - 1.24 mg/dL Final     PMHx:   Past Medical History:  Diagnosis Date  . Anemia 11/2016  . Arthritis    "some; hands/fingers" (06/25/2017)  . Atrial tachycardia ([place])   . Barrett's esophagus   . Basal cell carcinoma    "face, arms, hands" (06/25/2017)  . Chicken pox   . Cholelithiasis 11/2016   S/P LAP CHOLE  . Chronic combined systolic and diastolic CHF (congestive heart failure) ([place])   . CKD (chronic kidney disease), stage III ([place])   . Esophageal varices (HCC)    Secondary to NAFLD  . Family history of adverse reaction to anesthesia    "daughter's sat drop very low; last OR had to be reintubated" (06/25/2017)  . GERD (gastroesophageal reflux disease)   . Goals of care, counseling/discussion 10/22/2017  . High cholesterol   . Hypertension   . Kappa light chain myeloma ([place]) 10/22/2017  . Mitral regurgitation   . NAFLD (nonalcoholic fatty liver disease)   . NICM (nonischemic cardiomyopathy) ([place])    Echo 06/2017: Severe LVH.  EF 25 to 30% with mild HK of the baseline after septal wall.  Moderate to severely dilated right atrium, moderately dilated RV. (Concern fr ? amyloidosis).  . NSVT (nonsustained ventricular tachycardia) ([place])   . Pneumonia 09/2016  . Presence of permanent cardiac pacemaker 01/08/2018  . RBBB   . Tricuspid regurgitation    . Type II diabetes mellitus ([place])     Past Surgical History:  Procedure Laterality Date  . A-FLUTTER ABLATION N/A 05/22/2018   Procedure: A-FLUTTER ABLATION;  Surgeon: Constance Haw, MD;  Location: [facility];  Service: Cardiovascular;  Laterality: N/A;  . BACK SURGERY    . BASAL CELL CARCINOMA EXCISION Right    temple; "had to do skin graft"  . CARDIAC CATHETERIZATION  09/2016   Minimal, non-occlusive CAD, EF 45% [facility])  . CARDIOVERSION N/A 11/10/2017   Procedure: CARDIOVERSION;  Surgeon: Jolaine Artist, MD;  Location: [facility];  Service: Cardiovascular;  Laterality: N/A;  . CARDIOVERSION N/A 11/14/2017   Procedure: CARDIOVERSION;  Surgeon: Jolaine Artist, MD;  Location: [facility];  Service: Cardiovascular;  Laterality: N/A;  . CARDIOVERSION N/A 12/19/2017   Procedure: CARDIOVERSION;  Surgeon: Jolaine Artist, MD;  Location: [facility];  Service: Cardiovascular;  Laterality: N/A;  . CARDIOVERSION N/A 05/14/2018   Procedure: CARDIOVERSION;  Surgeon: Jolaine Artist, MD;  Location: [facility] ENDOSCOPY;  Service: Cardiovascular;  Laterality: N/A;  . CARPAL TUNNEL RELEASE Bilateral   . CATARACT EXTRACTION W/ INTRAOCULAR LENS  IMPLANT, BILATERAL Bilateral   . CHOLECYSTECTOMY N/A 11/22/2016   Procedure: LAPAROSCOPIC CHOLECYSTECTOMY;  Surgeon: Coralie Keens, MD;  Location: [facility];  Service: General;  Laterality: N/A;  . IR FLUORO GUIDE CV LINE RIGHT  11/26/2017  . IR US GUIDE VASC ACCESS RIGHT  11/26/2017  . JOINT REPLACEMENT    . LEAD REVISION/REPAIR N/A 05/22/2018   Procedure: RV lead;  Surgeon: Constance Haw, MD;  Location: [facility];  Service: Cardiovascular;  Laterality: N/A;  . LUMBAR DISC SURGERY  X 2  . PACEMAKER IMPLANT N/A 01/08/2018   Procedure: PACEMAKER IMPLANT;  Surgeon: Constance Haw, MD;  Location: [facility];  Service: Cardiovascular;  Laterality: N/A;  . TEE WITHOUT CARDIOVERSION N/A 11/10/2017    Procedure: TRANSESOPHAGEAL ECHOCARDIOGRAM (TEE);  Surgeon: Jolaine Artist, MD;  Location: [facility] ENDOSCOPY;  Service: Cardiovascular;  Laterality: N/A;  . TONSILLECTOMY AND ADENOIDECTOMY    . TOTAL KNEE ARTHROPLASTY Left 2010  . TRANSESOPHAGEAL ECHOCARDIOGRAM  09/2016   Mild global LV hypokinesis. Moderate MR. Moderate TR. [facility])  . TRANSTHORACIC ECHOCARDIOGRAM  10/2016   [facility]) EF 35-40% with moderate HK. Normal RV function. Moderately severe TR.  Marland Kitchen TRANSTHORACIC ECHOCARDIOGRAM  03/2017; 06/2017    A) ([place]) mild concentric LVH. EF of 40-45%. Diffuse HK with no RWMA. GR 2 DD. Septal dyssynergy. Mild MR, moderate TR. Severe RA dilation..;; B) Severe LVH.  EF 25 to 30% with mild HK of Septal wall.  Mod-severely dilated RA & Mod dilated RV.    Family Hx:  Family History  Problem Relation Age of Onset  . Heart failure Mother   . Cancer Father   . Cataracts Father   . Breast cancer Sister   . Alcohol abuse Brother   . Liver disease Brother   . Autoimmune disease Daughter        Bechet's syndrome  . Cirrhosis Neg Hx     Social History:  reports that he quit smoking about 41 years ago. His smoking use included cigarettes. He has a 4.50 pack-year smoking history. He has never used smokeless tobacco. He reports current alcohol use. He reports that he does not use drugs.  Allergies: No Known Allergies  Medications: Prior to Admission medications   Medication Sig Start Date End Date Taking? Authorizing Provider  apixaban (ELIQUIS) 5 MG TABS tablet Take 1 tablet (5 mg total) by mouth 2 (two) times daily. 05/29/18  Yes Camnitz, [place] Hassell Done, MD  dronabinol (MARINOL) 5 MG capsule Take 5 mg by mouth 2 (two) times daily before a meal.   Yes [provider]  gabapentin (NEURONTIN) 800 MG tablet Take 400 mg by mouth daily.   Yes [provider]  Magnesium 500 MG TABS Take 500 mg by mouth at bedtime.   Yes [provider]  Melatonin 5 MG  TABS Take 5 mg by mouth at bedtime.  Yes [provider]  milrinone (PRIMACOR) 20 MG/100 ML SOLN infusion Inject 0.0099 mg/min into the vein continuous. 11/27/17  Yes Shirley Friar, PA-C  Multiple Vitamin (MULTIVITAMIN WITH MINERALS) TABS tablet Take 1 tablet by mouth daily.   Yes [provider]  omeprazole (PRILOSEC) 40 MG capsule TAKE 1 CAPSULE(40 MG) BY MOUTH DAILY Patient taking differently: Take 40 mg by mouth daily.  02/11/18  Yes [name], MD  ondansetron (ZOFRAN) 4 MG tablet TAKE 1 TABLET(4 MG) BY MOUTH EVERY 8 HOURS AS NEEDED FOR NAUSEA OR VOMITING Patient taking differently: Take 4 mg by mouth 2 (two) times daily.  05/06/18  Yes [name], MD  potassium chloride SA (K-DUR,KLOR-CON) 20 MEQ tablet Take 20 mEq by mouth See admin instructions. Take 20 meq every morning may increase to 40 meq once daily as needed for excessive weight gain / swelling (take with 40 meq with 40 mg of torsemide)   Yes [provider]  rosuvastatin (CRESTOR) 10 MG tablet TAKE 1 TABLET BY MOUTH ONCE DAILY Patient taking differently: Take 10 mg by mouth daily.  02/09/18  Yes [name], MD  sertraline (ZOLOFT) 25 MG tablet TAKE 1 TABLET BY MOUTH EVERY DAY Patient taking differently: Take 25 mg by mouth daily.  04/28/18  Yes Bensimhon, Shaune Pascal, MD  torsemide (DEMADEX) 20 MG tablet Take 1 tablet (20 mg total) by mouth daily. Can take 20 mg extra as needed. Patient taking differently: Take 20-40 mg by mouth See admin instructions. Take 20 mg daily, may take an 40 mg instead if there is excessive weight gain/swelling 11/27/17  Yes Tillery, Satira Mccallum, PA-C  traZODone (DESYREL) 50 MG tablet TAKE 1 TABLET(50 MG) BY MOUTH AT BEDTIME Patient taking differently: Take 50 mg by mouth at bedtime.  02/09/18  Yes [name], MD  vitamin B-12 (CYANOCOBALAMIN) 500 MCG tablet Take 500 mcg by mouth every other day.   Yes [provider]   zolpidem (AMBIEN) 5 MG tablet TAKE 1 TABLET(5 MG) BY MOUTH AT BEDTIME Patient taking differently: Take 5 mg by mouth at bedtime.  05/18/18  Yes [name], MD  glucose blood test strip Use as instructed 12/01/17   [name], MD  hydrocortisone (ANUSOL-HC) 25 MG suppository Place 25 mg rectally 2 (two) times daily as needed for hemorrhoids or anal itching.    [provider]  polyethylene glycol (MIRALAX / GLYCOLAX) packet Take 17 g by mouth daily. Patient taking differently: Take 17 g by mouth daily as needed for moderate constipation.  11/28/17   Nita Sells, MD  VYNDAQEL 20 MG CAPS Take 80 mg by mouth daily. (4 capsules) Patient taking differently: Take 80 mg by mouth every evening.  11/27/17   Shirley Friar, PA-C    I have reviewed the patient's current medications.  Labs:  Results for orders placed or performed during the hospital encounter of 06/10/2018 (from the past 48 hour(s))  Glucose, capillary     Status: Abnormal   Collection Time: 06/15/18 11:58 AM  Result Value Ref Range   Glucose-Capillary 208 (H) 70 - 99 mg/dL  Glucose, capillary     Status: Abnormal   Collection Time: 06/15/18  5:15 PM  Result Value Ref Range   Glucose-Capillary 199 (H) 70 - 99 mg/dL  Glucose, capillary     Status: Abnormal   Collection Time: 06/15/18  7:49 PM  Result Value Ref Range   Glucose-Capillary 199 (H) 70 - 99 mg/dL  Glucose, capillary     Status: Abnormal   Collection Time: 06/15/18 11:23 PM  Result Value Ref Range   Glucose-Capillary 190 (H) 70 - 99 mg/dL  Basic metabolic panel     Status: Abnormal   Collection Time: 06/16/18  4:01 AM  Result Value Ref Range   Sodium 132 (L) 135 - 145 mmol/L   Potassium 3.7 3.5 - 5.1 mmol/L   Chloride 90 (L) 98 - 111 mmol/L   CO2 26 22 - 32 mmol/L   Glucose, Bld 209 (H) 70 - 99 mg/dL   BUN 81 (H) 8 - 23 mg/dL   Creatinine, Ser 4.41 (H) 0.61 - 1.24 mg/dL   Calcium 8.3 (L) 8.9 - 10.3 mg/dL   GFR calc non  Af Amer 12 (L) >60 mL/min   GFR calc Af Amer 14 (L) >60 mL/min   Anion gap 16 (H) 5 - 15    Comment: Performed at [facility] [address], [place], [place] [address]  CBC     Status: Abnormal   Collection Time: 06/16/18  4:01 AM  Result Value Ref Range   WBC 12.7 (H) 4.0 - 10.5 K/uL   RBC 3.61 (L) 4.22 - 5.81 MIL/uL   Hemoglobin 11.4 (L) 13.0 - 17.0 g/dL   HCT 33.8 (L) 39.0 - 52.0 %   MCV 93.6 80.0 - 100.0 fL   MCH 31.6 26.0 - 34.0 pg   MCHC 33.7 30.0 - 36.0 g/dL   RDW 19.2 (H) 11.5 - 15.5 %   Platelets 192 150 - 400 K/uL   nRBC 0.0 0.0 - 0.2 %    Comment: Performed at [facility], [place] [address], [place], [place] [address]  Magnesium     Status: None   Collection Time: 06/16/18  4:01 AM  Result Value Ref Range   Magnesium 2.2 1.7 - 2.4 mg/dL    Comment: Performed at [facility] [address], [place], [place] [address]  Phosphorus     Status: None   Collection Time: 06/16/18  4:01 AM  Result Value Ref Range   Phosphorus 3.3 2.5 - 4.6 mg/dL    Comment: Performed at [facility] [address], [place], [place] [address]  Glucose, capillary     Status: Abnormal   Collection Time: 06/16/18  4:04 AM  Result Value Ref Range   Glucose-Capillary 196 (H) 70 - 99 mg/dL  I-STAT 7, (LYTES, BLD GAS, ICA, H+H)     Status: Abnormal   Collection Time: 06/16/18  4:07 AM  Result Value Ref Range   pH, Arterial 7.499 (H) 7.350 - 7.450   pCO2 arterial 39.0 32.0 - 48.0 mmHg   pO2, Arterial 78.0 (L) 83.0 - 108.0 mmHg   Bicarbonate 30.3 (H) 20.0 - 28.0 mmol/L   TCO2 31 22 - 32 mmol/L   O2 Saturation 96.0 %   Acid-Base Excess 7.0 (H) 0.0 - 2.0 mmol/L   Sodium 131 (L) 135 - 145 mmol/L   Potassium 3.8 3.5 - 5.1 mmol/L   Calcium, Ion 1.08 (L) 1.15 - 1.40 mmol/L   HCT 35.0 (L) 39.0 - 52.0 %   Hemoglobin 11.9 (L) 13.0 - 17.0 g/dL   Patient temperature 98.9 F    Collection site ARTERIAL LINE    Drawn by Nurse    Sample type ARTERIAL   Cooxemetry Panel (carboxy,  met, total hgb, O2 sat)     Status: Abnormal   Collection Time: 06/16/18  4:30  AM  Result Value Ref Range   Total hemoglobin 10.4 (L) 12.0 - 16.0 g/dL   O2 Saturation 77.3 %   Carboxyhemoglobin 1.4 0.5 - 1.5 %   Methemoglobin 1.0 0.0 - 1.5 %  Glucose, capillary     Status: Abnormal   Collection Time: 06/16/18  8:18 AM  Result Value Ref Range   Glucose-Capillary 184 (H) 70 - 99 mg/dL  Glucose, capillary     Status: Abnormal   Collection Time: 06/16/18 11:57 AM  Result Value Ref Range   Glucose-Capillary 191 (H) 70 - 99 mg/dL  Glucose, capillary     Status: Abnormal   Collection Time: 06/16/18  4:04 PM  Result Value Ref Range   Glucose-Capillary 209 (H) 70 - 99 mg/dL  Glucose, capillary     Status: Abnormal   Collection Time: 06/16/18  4:13 PM  Result Value Ref Range   Glucose-Capillary 214 (H) 70 - 99 mg/dL  APTT     Status: Abnormal   Collection Time: 06/16/18  6:14 PM  Result Value Ref Range   aPTT 124 (H) 24 - 36 seconds    Comment:        IF BASELINE aPTT IS ELEVATED, SUGGEST PATIENT RISK ASSESSMENT BE USED TO DETERMINE APPROPRIATE ANTICOAGULANT THERAPY. REPEATED TO VERIFY Performed at [facility], [place] [address], [place], [place] [address]   Heparin level (unfractionated)     Status: Abnormal   Collection Time: 06/16/18  6:14 PM  Result Value Ref Range   Heparin Unfractionated >2.20 (H) 0.30 - 0.70 IU/mL    Comment: RESULTS CONFIRMED BY MANUAL DILUTION IF HEPARIN RESULTS ARE BELOW EXPECTED VALUES, AND PATIENT DOSAGE HAS BEEN CONFIRMED, SUGGEST FOLLOW UP TESTING OF ANTITHROMBIN III LEVELS. (NOTE) If heparin results are below expected values, and patient dosage has  been confirmed, suggest follow up testing of antithrombin III levels. Performed at [facility], [place] [address] [place], [place] [address]   Glucose, capillary     Status: Abnormal   Collection Time: 06/16/18  7:23 PM  Result Value Ref Range   Glucose-Capillary 181 (H) 70 - 99 mg/dL   APTT     Status: Abnormal   Collection Time: 06/16/18 11:24 PM  Result Value Ref Range   aPTT 56 (H) 24 - 36 seconds    Comment:        IF BASELINE aPTT IS ELEVATED, SUGGEST PATIENT RISK ASSESSMENT BE USED TO DETERMINE APPROPRIATE ANTICOAGULANT THERAPY. Performed at [facility], [place] [address] [place], [place] [address]   CBC     Status: Abnormal   Collection Time: 06/16/18 11:24 PM  Result Value Ref Range   WBC 10.2 4.0 - 10.5 K/uL   RBC 3.45 (L) 4.22 - 5.81 MIL/uL   Hemoglobin 11.0 (L) 13.0 - 17.0 g/dL   HCT 32.0 (L) 39.0 - 52.0 %   MCV 92.8 80.0 - 100.0 fL   MCH 31.9 26.0 - 34.0 pg   MCHC 34.4 30.0 - 36.0 g/dL   RDW 19.4 (H) 11.5 - 15.5 %   Platelets 184 150 - 400 K/uL   nRBC 0.0 0.0 - 0.2 %    Comment: Performed at [facility], [place] [address] [place], [place] [address]  Protime-INR     Status: Abnormal   Collection Time: 06/17/18 12:26 AM  Result Value Ref Range   Prothrombin Time 26.7 (H) 11.4 - 15.2 seconds   INR 2.5 (H) 0.8 - 1.2    Comment: (  NOTE) INR goal varies based on device and disease states. Performed at [facility], [place] [address], [place], [place] [address]   Cooxemetry Panel (carboxy, met, total hgb, O2 sat)     Status: Abnormal   Collection Time: 06/17/18  4:40 AM  Result Value Ref Range   Total hemoglobin 11.2 (L) 12.0 - 16.0 g/dL   O2 Saturation 84.1 %   Carboxyhemoglobin 1.4 0.5 - 1.5 %   Methemoglobin 1.0 0.0 - 1.5 %  Basic metabolic panel     Status: Abnormal   Collection Time: 06/17/18  4:40 AM  Result Value Ref Range   Sodium 132 (L) 135 - 145 mmol/L   Potassium 3.3 (L) 3.5 - 5.1 mmol/L   Chloride 88 (L) 98 - 111 mmol/L   CO2 30 22 - 32 mmol/L   Glucose, Bld 209 (H) 70 - 99 mg/dL   BUN 96 (H) 8 - 23 mg/dL   Creatinine, Ser 5.02 (H) 0.61 - 1.24 mg/dL   Calcium 8.1 (L) 8.9 - 10.3 mg/dL   GFR calc non Af Amer 10 (L) >60 mL/min   GFR calc Af Amer 12 (L) >60 mL/min   Anion gap 14 5 - 15    Comment: Performed at  [facility] [address], [place], [place] [address]  CBC     Status: Abnormal   Collection Time: 06/17/18  4:40 AM  Result Value Ref Range   WBC 9.4 4.0 - 10.5 K/uL   RBC 3.36 (L) 4.22 - 5.81 MIL/uL   Hemoglobin 10.7 (L) 13.0 - 17.0 g/dL   HCT 31.4 (L) 39.0 - 52.0 %   MCV 93.5 80.0 - 100.0 fL   MCH 31.8 26.0 - 34.0 pg   MCHC 34.1 30.0 - 36.0 g/dL   RDW 19.4 (H) 11.5 - 15.5 %   Platelets 171 150 - 400 K/uL   nRBC 0.0 0.0 - 0.2 %    Comment: Performed at [facility], [place] [address] [place], [place] [address]     ROS: Most of the information obtained from the chart.  Review of system is limited as patient is intubated.  Physical Exam: Vitals:   06/17/18 0800 06/17/18 0830  BP: 106/63 106/65  Pulse: 89 87  Resp: 17 20  Temp:    SpO2: 95% 96%     General exam: Intubated but able to follow commands and opening eyes. Respiratory system: Coarse breath sound bilateral, no wheezing Cardiovascular system: S1 & S2 heard, RRR, no rubs.  Bilateral ankle edema. Gastrointestinal system: Abdomen is nondistended, soft . Normal bowel sounds heard. Central nervous system: Alert and opening eyes with the name. Extremities: No cyanosis. Skin: No rashes, lesions or ulcers Psychiatry: Unable to assess.  Assessment/Plan:  #Acute kidney injury on CKD stage III, nonoliguric due to cardiorenal syndrome and acute worsening creatinine due to ischemic ATN related with over diuretics -CVP is 10 today, agree with holding Lasix drip.  His volume status looks acceptable for now.  Continue to watch for urine output, BMP.  Check urinalysis, urine electrolytes and ultrasound kidney.  He has a Foley catheter.  I have discussed with the patient and his son that if his kidney function continue to worsen then he may need dialysis.  I discussed that it may be permanent.  They will discussed among families regarding goals of care.  Avoid IV contrast or hypotensive episode.  #Cardiogenic shock: On  Levophed currently.  Heart failure team is following.  #  Atrial fibrillation/flutter on status post DCCV, on amiodarone and heparin.  #Acute respiratory failure with hypoxia currently intubated.  Per PCCM  #Kappa Light chain myeloma and concern for cardiac amyloid: Status post treatment, follows with oncology.  Discussed with Dr. [name].  Dron Tanna Furry 06/17/2018, 8:54 AM  [employer].

## 2018-06-17 NOTE — Progress Notes (Signed)
[facility] for Heparin -apixaban on hold Indication: atrial fibrillation  No Known Allergies  Patient Measurements: Height: 5' 10"  (177.8 cm) Weight: 188 lb 15 oz (85.7 kg) IBW/kg (Calculated) : 73   Vital Signs: Temp: 98.5 F (36.9 C) (03/04 0400) Temp Source: Axillary (03/04 0400) BP: 104/62 (03/04 1330) Pulse Rate: 88 (03/04 1330)  Labs: Recent Labs    06/15/18 0252  06/16/18 0401 06/16/18 0407 06/16/18 1814 06/16/18 2324 06/17/18 0026 06/17/18 0440 06/17/18 0913  HGB  --    < > 11.4* 11.9*  --  11.0*  --  10.7*  --   HCT  --    < > 33.8* 35.0*  --  32.0*  --  31.4*  --   PLT  --   --  192  --   --  184  --  171  --   APTT  --   --   --   --  124* 56*  --   --  93*  LABPROT  --   --   --   --   --   --  26.7*  --   --   INR  --   --   --   --   --   --  2.5*  --   --   HEPARINUNFRC  --   --   --   --  >2.20*  --   --   --  >2.20*  CREATININE 3.92*  --  4.41*  --   --   --   --  5.02*  --    < > = values in this interval not displayed.    Estimated Creatinine Clearance: 12.5 mL/min (A) (by C-G formula based on SCr of 5.02 mg/dL (H)).   Assessment: 77yom intubated 2/27 to protect aairway after having runs VT needing to be shocked.  VT quiet on amiodaone drip - EP following  Milrinone 0.154mg/kg/min for low output - stop today 3/4 as NE needs continue to be high and coox stable Has been on apixaban for anticoaguation in setting of Afib.  Now with AKI Cr 5 and in ICU may need IV lines placed - will hold apixaban and start heparin drip.  CBC ok. Apixaban can fasley elevated the HL will use aptt for heparin dosing until washout / the two correlate.  Bleeding from discontinued a line.  Heparin was held 3/3 at 21:30.   Bleeding has now resolved.  Large hematoma on L arm  Heparin drip 850 uts/hr aptt 93sec at goal  Goal of Therapy:  aptt 66-103 sec Heparin level 0.3-0.7 units/ml Monitor platelets by anticoagulation protocol:  Yes   Plan:  Continue heparin infusion 850 units/hr Daily HL, APTT,CBC Monitor s/s bleeding and renal function   LBonnita NasutiPharm.D. CPP, BCPS Clinical Pharmacist [phone]/07/2018 2:26 PM

## 2018-06-17 NOTE — Progress Notes (Signed)
Morning ABG held due to patient being on high dose of heparin.  Patient had pulsatile bleeding from discontinued arterial line site.

## 2018-06-17 NOTE — Progress Notes (Addendum)
NAME:  Dillon Andrews, MRN:  [id number], DOB:  [birth year], LOS: 4 ADMISSION DATE:  06/08/2018, CONSULTATION DATE:  06/12/2018 REFERRING MD:  Sela Hua, MD, CHIEF COMPLAINT:  Respiratory failure  Brief History   Mr. Ballen is a 78 YO gentleman with history significant for Chronic systolic HF on home milrinone due to TTR amyloidosis, atrial flutter, pace maker placement due to complete heart block with recent RV lead revision, chronic anticoagulation, NASH with esophageal varices  and CKD who is transferred to the CCU from the floor after he developed multiple episodes of rapid VT with hemodynamic instability requiring intubation and cardioversion  Past Medical History  AFlutter: H/o ablation, known to have multiple circuits, NASH W/esophageal varicies NICM, Chronic CHF On home milrinone, DM, HTN, CKD (III)  Significant Hospital Events   -Endotracheal intubation 2/28 -Right IJ central line 2/28 -Left radial A-line 2/28 -DC cardioversion 2/28 3/3 still on pressors and lasix gtt. Starting PSV trial 3/4: Lasix and additional diuretics all discontinued due to rising creatinine up to 5.  Still requiring norepinephrine however weaning this down slowly.  Developed increased accessory use when pressure support limited to 5.  Chest x-ray unchanged with bilateral pleural effusions and pulmonary edema.  Nephrology approaching family about possible dialysis  Consults:  -PCCM  Procedures:  As above  Significant Diagnostic Tests:  05/22/2018: EPS/ablation:  1. Right atrial flutter upon presentation. 2. Successful radiofrequency ablation of atrial flutter along the cavotricuspid isthmus with complete bidirectional isthmus block achieved. 3. No early apparent complications.   Echo 12.31/2019 - Left ventricle: The cavity size was normal. Wall thickness was   increased in a pattern of severe LVH. Systolic function was   moderately reduced. The estimated ejection fraction was in the   range of 35%  to 40%. Diffuse hypokinesis. Doppler parameters are   consistent with a reversible restrictive pattern, indicative of   decreased left ventricular diastolic compliance and/or increased   left atrial pressure (grade 3 diastolic dysfunction). - Ventricular septum: The contour showed diastolic flattening. - Aortic valve: Trileaflet; mildly thickened, mildly calcified   leaflets. - Mitral valve: There was mild regurgitation. - Left atrium: The atrium was moderately dilated. - Right ventricle: The cavity size was moderately dilated. Wall   thickness was normal. - Right atrium: The atrium was moderately dilated. - Pulmonary arteries: Systolic pressure was moderately to severely   increased. PA peak pressure: 68 mm Hg (S).    11/10/2017: TEE/DCCV - Left ventricle: The estimated ejection fraction was 10%. Diffusehypokinesis. There was spontaneous echo contrast, indicative ofstasis. No evidence of thrombus. - Aortic valve: No evidence of vegetation. - Mitral valve: No evidence of vegetation. There was mild regurgitation. - Left atrium: The atrium was moderately dilated. No evidence of thrombus in the atrial cavity or appendage. No evidence of thrombus in the atrial cavity or appendage. - Right ventricle: Systolic function was severely reduced. - Right atrium: The atrium was dilated. - Atrial septum: No defect or patent foramen ovale was identified. - Tricuspid valve: No evidence of vegetation. There was moderate regurgitation. - Pulmonic valve: No evidence of vegetation. Impressions: - Successful cardioversion. No cardiac source of emboli was indentified.  Micro Data:  -None   Antimicrobials:  -None   Subjective   He looks comfortable on pressure support of 10  Objective   Blood pressure 106/65, pulse 87, temperature 98.5 F (36.9 C), temperature source Axillary, resp. rate 20, height 5' 10"  (1.778 m), weight 85.7 kg, SpO2 96 %. CVP:  [9  mmHg-20 mmHg] 10 mmHg    Vent Mode: CPAP;PSV FiO2 (%):  [40 %] 40 % Set Rate:  [16 bmp] 16 bmp Vt Set:  [580 mL] 580 mL PEEP:  [5 cmH20] 5 cmH20 Pressure Support:  [5 cmH20-10 cmH20] 10 cmH20 Plateau Pressure:  [17 cmH20-18 cmH20] 18 cmH20   Intake/Output Summary (Last 24 hours) at 06/17/2018 2683 Last data filed at 06/17/2018 4196 Gross per 24 hour  Intake 2510.56 ml  Output 2435 ml  Net 75.56 ml   Filed Weights   06/15/18 0318 06/16/18 0400 06/17/18 0600  Weight: 88.6 kg 85.9 kg 85.7 kg    Examination:  General: This is a 78 year old white male is currently sedated on the ventilator he is in no acute distress currently HEENT: Normocephalic atraumatic no JVD orally intubated mucous membranes are moist Pulmonary: Diminished bilateral bases no accessory use on pressure support of 10, however does demonstrate increased tachypnea, lower tidal volumes, and mild accessory use when pressure support limited to 5 Cardiac: Regular rate and rhythm Abdomen: Soft nontender Extremities: Warm, pulses palpable brisk capillary refill Neuro: Awake, follows commands, no focal deficits  Assessment & Plan:    Acute hypoxic respiratory failure d/t acute pulmonary edema and pleural effusions Portable chest x-ray personally reviewed there is still no significant change with bilateral pulmonary edema as well as element of pleural effusion  -He developed accessory use on pressure support of 5 Plan Continuing to optimize hemodynamics  Holding diuresis because of declining renal function  Pressure support as tolerated today, not ready for extubation  Repeating chest x-ray in a.m. 3/5  VAP bundle  He may require therapeutic thoracentesis in order to liberate him from the vent, although would prefer to extubate him and then proceed with thoracentesis after aspirate be lower risk if this is indeed needed    Cardiogenic Shock in setting of acute on chronic systolic and diastolic HF H/o TTR amyloid Plan Lasix and milrinone  both stopped Titrating norepinephrine Holding metolazone, hydralazine, and Imdur Not an ARB candidate given CKD He has been off his tafamidis since it cannot be crushed  Multiple arrhythmias: VT/ fib/flutter Plan Continuing amiodarone per cardiology Continue Eliquis  Acute on chronic Kidney disease  Mild metabolic alkalosis  Suspect likely due to venous congestion in the setting of volume overload -Now likely declining from diuretics, unfortunately creatinine now up to 5 Plan Titrate norepinephrine for mean arterial pressure greater than 65 Nephrology following Holding diuresis today I worry should his renal function continue to decline if he can actually tolerate intermittent dialysis  Hypokalemia Plan Replace, recheck in a.m.  Hyperglycemia Plan ssi   Anemia w/out evidence of bleeding Plan Trend cbc  Best practice:  Diet: Nutrition consult to initiate tube feeds  Pain/Anxiety/Delirium protocol (if indicated): Fentanyl, Midazolam VAP protocol (if indicated): HoB elevated, PPI, Oral care  DVT prophylaxis: Eliquis GI prophylaxis: Pantoprazole  Glucose control: Maintain euglycemia  Mobility: PT and OT to evaluate and treat as appropriate  Code Status: Full code  Family Communication: Family updated  Disposition: He remains critically ill due to decompensated heart failure and cardiogenic shock.  Chest x-ray continues to demonstrate element of pulmonary edema and bilateral pleural effusions.  His renal function is declining further.  We have stopped diuresis.  Given his underlying cardiomyopathy I worry about his ability to tolerate intermittent dialysis.  I suspect he would tolerate CRRT as a means to get him off the ventilator however I am not sure long-term dialysis is a viable option for  him.  This remains to be seen.  Family is talking about goals of care today.  I do think we need to discuss this further.  He currently is full CODE STATUS at age 66 with declining renal  function and severe cardiomyopathy.   Erick Colace ACNP-BC [facility] Pulmonary/Critical Care Pager # [phone] OR # [phone] if no answer  Attending Note:  78 year old male with heart failure and acute pulmonary edema from a-fib that resulted in respiratory failure.  Patient was diuresed aggressively but continues to be fluid overload.  Overnight, renal function as deteriorated.  On exam, he is weaning with coarse BS diffusely.  I reviewed CXR myself, ETT is in a good position.  Discussed with PCCM-NP.  Will hold lasix.  Will likely need CRRT.  Renal consult called by heart failure team.  Will not extubate today until cardiac rhythm and fluid status has been optimized.  PCCM will continue to follow.  The patient is critically ill with multiple organ systems failure and requires high complexity decision making for assessment and support, frequent evaluation and titration of therapies, application of advanced monitoring technologies and extensive interpretation of multiple databases.   Critical Care Time devoted to patient care services described in this note is  32  Minutes. This time reflects time of care of this signee Dr Jennet Maduro. This critical care time does not reflect procedure time, or teaching time or supervisory time of PA/NP/Med student/Med Resident etc but could involve care discussion time.  Rush Farmer, M.D. [facility] Pulmonary/Critical Care Medicine. Pager: [phone]. After hours pager: [phone].

## 2018-06-17 NOTE — Progress Notes (Addendum)
Advanced Heart Failure Rounding Note   Subjective:    Remains intubated.  NE @ 16 mcg + milrinone 0.125 mcg + amio 30 mg per hour. CO-OX 84%.   On lasix drip + metolazone.   Over night he has some bleeding around his arterial line. LUE was wrapped. Heparin was stopped for a few hours.   Follows commands.     Objective:   Weight Range:  Vital Signs:   Temp:  [98.5 F (36.9 C)-99.6 F (37.6 C)] 98.5 F (36.9 C) (03/04 0400) Pulse Rate:  [83-98] 89 (03/04 0715) Resp:  [14-28] 17 (03/04 0715) BP: (100-118)/(55-84) 107/62 (03/04 0715) SpO2:  [92 %-96 %] 96 % (03/04 0715) Arterial Line BP: (107-131)/(46-60) 128/49 (03/03 2300) FiO2 (%):  [40 %] 40 % (03/04 0434) Weight:  [85.7 kg] 85.7 kg (03/04 0600) Last BM Date: 05/22/2018  Weight change: Filed Weights   06/15/18 0318 06/16/18 0400 06/17/18 0600  Weight: 88.6 kg 85.9 kg 85.7 kg    Intake/Output:   Intake/Output Summary (Last 24 hours) at 06/17/2018 0754 Last data filed at 06/17/2018 0600 Gross per 24 hour  Intake 2777.77 ml  Output 2260 ml  Net 517.77 ml    Physical Exam: CVP 11-12  General: No resp difficulty HEENT: ETT Neck: supple. 11-12 + . Carotids 2+ bilat; no bruits. No lymphadenopathy or thryomegaly appreciated. Cor: PMI nondisplaced. Regular rate & rhythm. No rubs, gallops or murmurs. Lungs: coarse throughout Abdomen: soft, nontender, nondistended. No hepatosplenomegaly. No bruits or masses. Good bowel sounds. Extremities: no cyanosis, clubbing, rash, edema. R and LLE SCDs.  LUE wrap  Neuro: alert , follows commands. MAEx4.   Telemetry: A sensed V paced 90s    Labs: Basic Metabolic Panel: Recent Labs  Lab 06/13/18 0427 06/13/18 1634 06/14/18 0325 06/14/18 1709 06/15/18 0252 06/16/18 0401 06/16/18 0407 06/17/18 0440  NA 135  --  133*  --  133* 132* 131* 132*  K 4.3  --  4.1  --  3.7 3.7 3.8 3.3*  CL 96*  --  96*  --  93* 90*  --  88*  CO2 24  --  23  --  28 26  --  30  GLUCOSE 153*   --  172*  --  191* 209*  --  209*  BUN 35*  --  48*  --  63* 81*  --  96*  CREATININE 2.62*  --  3.37*  --  3.92* 4.41*  --  5.02*  CALCIUM 8.9  --  8.4*  --  7.8* 8.3*  --  8.1*  MG 2.5* 2.4 2.4 2.4  --  2.2  --   --   PHOS 5.7* 5.3* 5.1* 4.3  --  3.3  --   --     Liver Function Tests: Recent Labs  Lab 06/12/18 2158  AST 65*  ALT 30  ALKPHOS 147*  BILITOT 1.7*  PROT 6.1*  ALBUMIN 2.9*   No results for input(s): LIPASE, AMYLASE in the last 168 hours. No results for input(s): AMMONIA in the last 168 hours.  CBC: Recent Labs  Lab 06/12/18 2158 06/13/18 0427 06/16/18 0401 06/16/18 0407 06/16/18 2324 06/17/18 0440  WBC 12.3* 13.1* 12.7*  --  10.2 9.4  NEUTROABS 9.6*  --   --   --   --   --   HGB 14.0 14.6 11.4* 11.9* 11.0* 10.7*  HCT 44.0 45.9 33.8* 35.0* 32.0* 31.4*  MCV 98.9 98.9 93.6  --  92.8 93.5  PLT 217 240 192  --  184 171    Cardiac Enzymes: No results for input(s): CKTOTAL, CKMB, CKMBINDEX, TROPONINI in the last 168 hours.  BNP: BNP (last 3 results) Recent Labs    11/05/17 1310 12/02/17 1846 01/05/18 1431  BNP 515.3* 349.6* 516.7*    ProBNP (last 3 results) No results for input(s): PROBNP in the last 8760 hours.    Other results:  Imaging: Dg Chest 1 View  Result Date: 06/15/2018 CLINICAL DATA:  Increased tracheal secretions. EXAM: CHEST  1 VIEW, unable to remove metallic device over left shoulder. COMPARISON:  06/12/2018 FINDINGS: Endotracheal tube has tip 3.6 cm above the carina. Nasogastric tube courses into the region of the stomach as tip is not visualized. Dual lead left-sided cardiac pacemaker unchanged. Two right-sided central venous catheters unchanged with tips over the SVC. Metallic device over the left shoulder obscures the periphery of the left upper lung. Lungs are adequately inflated with mild hazy bibasilar opacification likely small layering effusions with associated basilar atelectasis. Findings are not significantly changed. Mild  hazy appearance of the perihilar markings suggesting mild interstitial edema. Stable cardiomegaly. Remainder of the exam is unchanged. IMPRESSION: Stable cardiomegaly with evidence of mild stable interstitial edema and small bilateral pleural effusions with associated bibasilar atelectasis. Infection in the mid to lower lungs is possible. Tubes and lines as described. Electronically Signed   By: Marin Olp M.D.   On: 06/15/2018 10:16   Dg Chest Port 1 View  Result Date: 06/16/2018 CLINICAL DATA:  ET tube, respiratory failure EXAM: PORTABLE CHEST 1 VIEW COMPARISON:  06/15/2018 FINDINGS: Left pacer, endotracheal tube, right central lines, NG tube remain in place, unchanged. Cardiomegaly with bilateral diffuse airspace disease and layering effusions, most compatible with edema/CHF. No significant change. IMPRESSION: Continued edema with layering effusions compatible with CHF. No real change. Support devices stable. Electronically Signed   By: Rolm Baptise M.D.   On: 06/16/2018 09:18     Medications:     Scheduled Medications: . chlorhexidine gluconate (MEDLINE KIT)  15 mL Mouth Rinse BID  . Chlorhexidine Gluconate Cloth  6 each Topical Q0600  . feeding supplement (PRO-STAT SUGAR FREE 64)  30 mL Per Tube TID  . feeding supplement (VITAL 1.5 CAL)  1,000 mL Per Tube Q24H  . gabapentin  400 mg Oral QHS  . insulin aspart  0-9 Units Subcutaneous Q4H  . mouth rinse  15 mL Mouth Rinse 10 times per day  . Melatonin  6 mg Oral QHS  . multivitamin with minerals  1 tablet Oral Daily  . ondansetron  4 mg Oral BID  . pantoprazole sodium  40 mg Per Tube Daily  . rosuvastatin  10 mg Oral Daily  . sertraline  25 mg Oral Daily  . sodium chloride flush  10-40 mL Intracatheter Q12H  . tamsulosin  0.4 mg Oral Daily  . traZODone  50 mg Oral QHS  . vitamin B-12  500 mcg Oral QODAY  . zolpidem  5 mg Oral QHS    Infusions: . sodium chloride 5 mL/hr at 06/17/18 0600  . amiodarone 30 mg/hr (06/17/18 0600)  .  heparin 850 Units/hr (06/17/18 0600)  . midazolam Stopped (06/13/18 0700)  . milrinone 0.125 mcg/kg/min (06/17/18 0600)  . norepinephrine (LEVOPHED) Adult infusion 16 mcg/min (06/17/18 0600)    PRN Medications: acetaminophen, alum & mag hydroxide-simeth, fentaNYL (SUBLIMAZE) injection, hydrocortisone, ondansetron (ZOFRAN) IV, promethazine, sodium chloride flush, traMADol   Assessment:   Dillon Etchison Knightis a 78 y.o.malewith a  hx of chronic combined CHF due to NICM with probable TTR amyloid, CKD III,presumedNASH(per pt, never proven),esophageal varices,  RBBB, PAF requiring multiple DCCV and now s/p atrial flutter ablation 05/22/18. He is on home milrinone 0.125 followed by [facility].   He was admitted for RV lead revision. Admission complicated by recurrent HF in setting of AF with RVR. On 2/28 decompensated with multiple episodes of VT complicated by respiratory failure leading to intubation and DC-CV   Plan/Discussion:    1. Acute on chronic systolic HF -> cardiogenic shock -NICM by cath at [facility] 09/2016 -Echo 06/2017: EF 25-30%, mild LAE, mild/mod RV dilation, mod/severe RA. Echo 10/2017: EF 10% -Echo12/19 EF 35-40%. He has been on home milrinone -PYP scan strongly suggestive of TTR amyloid, SPEP with 0.8% m-spike, UPEP with Bence Jones Protein positive, kappa type. 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 Dr Marin Olp saw and thinks he does not have AL amyloid (just multiple myeloma) and that he has TTR amyloid and should be treated with tafamidis before chemo. Tafamidis started on 11/14/17.  - Decompensated in setting of recurrent AF and VT - NE at 16 mcg. CO-OX 84%.  - Stop milrinone. Not sure how much milrinone is helping.  - Course c/b ATN and cardiorenal syndrome - Stop lasix and metolazone with worsening renal function.   - No ARB/entresto/spirowith for now with CKD - Has been off hydralazine/imdur. No b-blocker with low output  2.Paroxysmal  AtrialFlutter/Fib - s/p  DCCV of 7/31, 8/2, 12/19/2017  -  s/p aflutter ablation 2/7 -  had recurrent AF on admit. S/p emergent DC-CV 2/28 - On 2/29 in junctional rhythm. Pacer now set DD at 90 to force AV pacing. -  Continue IV amio.  Failedpo amiodarone previously with recurrent nausea. But does not tolerate AF well and will need AA drug -Continue Eliquis 5 mg BID   3. VT - improved with IV lidocaine on 2/27 - now quiescent on IV amio - Keep K> 4.0 mg > 2.0  4. Acute hypoxic respiratory failure - Stable on vent - CCM appreciated.    5. h/o junctional rhythm with symptomatic bradycardia -> MDT PPM.  -RV lead revised this admission. Currently stable - EP following  6. Acute on chronic renal failure - likely due to ATN and cardiorenal syndrome - baseline creatinine 1.6-1.8 - Creatinine continues to rise-->3.9-->4.4->5.0 . -Support renal perfusion with inotropes. Hold diuretics.  - Refer to nephrology.   7. 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 Marin Olp  -Started on Bortezomib7/18/18andRevlimid7/23/19. - Dr Marin Olp thinks he does not have AL amyloid and that he does have TTR amyloid and should be treated with tafamidis before chemo. ContinueTafamidis.  - Off tafamdis currently as pills can't be crushed  8. LUE Hematoma  Continue ace wrap and elevate LUE   Length of Stay: 4   Amy Clegg NP-C  06/17/2018, 7:54 AM  Advanced Heart Failure Team Pager [phone] (M-F; 7a - 4p)  Please contact [facility] for night-coverage after hours (4p -7a ) and weekends on [email or web address]  Agree with above.  Remains on vent. Awake and alert. On norepi 16 for hemodynamic support. Diuresing well with lasix gtt and metolazone but creatinine now > 5. CVP 11. Continues with AV pacing. No further VT or AF  Intubated awake JVP to jaw Cor RRR Lungs CTA Ab soft NT Ext warm no edema  Remains very tenuous. On NE 16 and milrinone. Will continue to wean as tolerated. Can hold milrinone  for now. Renal function worsening but continues to diurese. CVP down. Will stop lasix and metolazone for now. Discussed with Renal and CCM personally. Appreciate their input. Not ready for extubation yet.   CRITICAL CARE Performed by: Glori Bickers  Total critical care time: 35 minutes  Critical care time was exclusive of separately billable procedures and treating other patients.  Critical care was necessary to treat or prevent imminent or life-threatening deterioration.  Critical care was time spent personally by me (independent of midlevel providers or residents) on the following activities: development of treatment plan with patient and/or surrogate as well as nursing, discussions with consultants, evaluation of patient's response to treatment, examination of patient, obtaining history from patient or surrogate, ordering and performing treatments and interventions, ordering and review of laboratory studies, ordering and review of radiographic studies, pulse oximetry and re-evaluation of patient's condition.  Glori Bickers, MD  3:40 PM

## 2018-06-18 ENCOUNTER — Encounter (HOSPITAL_COMMUNITY): Payer: Medicare HMO | Admitting: Internal Medicine

## 2018-06-18 ENCOUNTER — Inpatient Hospital Stay (HOSPITAL_COMMUNITY): Payer: Medicare HMO

## 2018-06-18 DIAGNOSIS — I5021 Acute systolic (congestive) heart failure: Secondary | ICD-10-CM

## 2018-06-18 DIAGNOSIS — G9340 Encephalopathy, unspecified: Secondary | ICD-10-CM

## 2018-06-18 LAB — COOXEMETRY PANEL
Carboxyhemoglobin: 1.2 % (ref 0.5–1.5)
Methemoglobin: 1.5 % (ref 0.0–1.5)
O2 Saturation: 69.4 %
Total hemoglobin: 11.6 g/dL — ABNORMAL LOW (ref 12.0–16.0)

## 2018-06-18 LAB — GLUCOSE, CAPILLARY
GLUCOSE-CAPILLARY: 133 mg/dL — AB (ref 70–99)
Glucose-Capillary: 160 mg/dL — ABNORMAL HIGH (ref 70–99)
Glucose-Capillary: 171 mg/dL — ABNORMAL HIGH (ref 70–99)
Glucose-Capillary: 150 mg/dL — ABNORMAL HIGH (ref 70–99)
Glucose-Capillary: 161 mg/dL — ABNORMAL HIGH (ref 70–99)
Glucose-Capillary: 125 mg/dL — ABNORMAL HIGH (ref 70–99)

## 2018-06-18 LAB — PROTIME-INR
INR: 1.9 — AB (ref 0.8–1.2)
Prothrombin Time: 21.3 seconds — ABNORMAL HIGH (ref 11.4–15.2)

## 2018-06-18 LAB — MAGNESIUM: Magnesium: 2.3 mg/dL (ref 1.7–2.4)

## 2018-06-18 LAB — COMPREHENSIVE METABOLIC PANEL
ALBUMIN: 2.1 g/dL — AB (ref 3.5–5.0)
ALT: 13 U/L (ref 0–44)
AST: 45 U/L — ABNORMAL HIGH (ref 15–41)
Alkaline Phosphatase: 189 U/L — ABNORMAL HIGH (ref 38–126)
Anion gap: 13 (ref 5–15)
BUN: 110 mg/dL — ABNORMAL HIGH (ref 8–23)
CO2: 31 mmol/L (ref 22–32)
CREATININE: 5.06 mg/dL — AB (ref 0.61–1.24)
Calcium: 8 mg/dL — ABNORMAL LOW (ref 8.9–10.3)
Chloride: 86 mmol/L — ABNORMAL LOW (ref 98–111)
GFR calc Af Amer: 12 mL/min — ABNORMAL LOW (ref >60)
GFR calc non Af Amer: 10 mL/min — ABNORMAL LOW (ref >60)
Glucose, Bld: 166 mg/dL — ABNORMAL HIGH (ref 70–99)
Potassium: 3.2 mmol/L — ABNORMAL LOW (ref 3.5–5.1)
Sodium: 130 mmol/L — ABNORMAL LOW (ref 135–145)
Total Bilirubin: 0.9 mg/dL (ref 0.3–1.2)
Total Protein: 5.6 g/dL — ABNORMAL LOW (ref 6.5–8.1)

## 2018-06-18 LAB — CBC
HCT: 32.5 % — ABNORMAL LOW (ref 39.0–52.0)
Hemoglobin: 11 g/dL — ABNORMAL LOW (ref 13.0–17.0)
MCH: 31 pg (ref 26.0–34.0)
MCHC: 33.8 g/dL (ref 30.0–36.0)
MCV: 91.5 fL (ref 80.0–100.0)
Platelets: 186 10*3/uL (ref 150–400)
RBC: 3.55 MIL/uL — ABNORMAL LOW (ref 4.22–5.81)
RDW: 19 % — ABNORMAL HIGH (ref 11.5–15.5)
WBC: 9 10*3/uL (ref 4.0–10.5)
nRBC: 0 % (ref 0.0–0.2)

## 2018-06-18 LAB — BASIC METABOLIC PANEL
Anion gap: 15 (ref 5–15)
BUN: 111 mg/dL — AB (ref 8–23)
CO2: 30 mmol/L (ref 22–32)
CREATININE: 5.11 mg/dL — AB (ref 0.61–1.24)
Calcium: 8.2 mg/dL — ABNORMAL LOW (ref 8.9–10.3)
Chloride: 87 mmol/L — ABNORMAL LOW (ref 98–111)
GFR calc Af Amer: 12 mL/min — ABNORMAL LOW (ref >60)
GFR calc non Af Amer: 10 mL/min — ABNORMAL LOW (ref >60)
GLUCOSE: 164 mg/dL — AB (ref 70–99)
Potassium: 3.7 mmol/L (ref 3.5–5.1)
Sodium: 132 mmol/L — ABNORMAL LOW (ref 135–145)

## 2018-06-18 LAB — HEMOGLOBIN AND HEMATOCRIT, BLOOD
HCT: 32.6 % — ABNORMAL LOW (ref 39.0–52.0)
Hemoglobin: 11.2 g/dL — ABNORMAL LOW (ref 13.0–17.0)

## 2018-06-18 LAB — APTT: aPTT: 65 seconds — ABNORMAL HIGH (ref 24–36)

## 2018-06-18 LAB — HEPARIN LEVEL (UNFRACTIONATED): Heparin Unfractionated: >2.2 IU/mL — ABNORMAL HIGH (ref 0.30–0.70)

## 2018-06-18 MED ORDER — POTASSIUM CHLORIDE 20 MEQ/15ML (10%) PO SOLN
40.0000 meq | Freq: Once | ORAL | Status: AC
  Administered 2018-06-18: 40 meq via ORAL
  Filled 2018-06-18: qty 30

## 2018-06-18 MED ORDER — ADULT MULTIVITAMIN LIQUID CH
15.0000 mL | Freq: Every day | ORAL | Status: DC
  Administered 2018-06-18 – 2018-06-19 (×2): 15 mL via ORAL
  Filled 2018-06-18 (×3): qty 15

## 2018-06-18 MED ORDER — POTASSIUM CHLORIDE 10 MEQ/50ML IV SOLN
10.0000 meq | INTRAVENOUS | Status: AC
  Administered 2018-06-18 (×2): 10 meq via INTRAVENOUS
  Filled 2018-06-18 (×2): qty 50

## 2018-06-18 MED ORDER — POTASSIUM CHLORIDE 20 MEQ/15ML (10%) PO SOLN: 20.0000 meq | Freq: Once | ORAL | Status: DC

## 2018-06-18 MED ORDER — POTASSIUM CHLORIDE CRYS ER 20 MEQ PO TBCR: 40.0000 meq | EXTENDED_RELEASE_TABLET | Freq: Once | ORAL | Status: DC

## 2018-06-18 MED ORDER — FUROSEMIDE 10 MG/ML IJ SOLN
120.0000 mg | Freq: Once | INTRAVENOUS | Status: AC
  Administered 2018-06-18: 120 mg via INTRAVENOUS
  Filled 2018-06-18: qty 10

## 2018-06-18 MED ORDER — POTASSIUM CHLORIDE 10 MEQ/50ML IV SOLN: 10.0000 meq | INTRAVENOUS | Status: DC

## 2018-06-18 MED ORDER — POTASSIUM CHLORIDE 20 MEQ/15ML (10%) PO SOLN
20.0000 meq | Freq: Once | ORAL | Status: DC
  Filled 2018-06-18: qty 15

## 2018-06-18 NOTE — Progress Notes (Signed)
Nutrition Follow-up  DOCUMENTATION CODES:   Non-severe (moderate) malnutrition in context of chronic illness  INTERVENTION:   Tube Feeding:  Continue Vital 1.5 at 40 ml/hr Continue Pro-Stat 30 mL TID Provides 1740 kcals, 110 g of protein and 726 mL of free water Meets 100% of protein needs, calorie needs  NUTRITION DIAGNOSIS:   Moderate Malnutrition related to chronic illness(chronic CHF, CKD, NASH) as evidenced by moderate fat depletion, severe muscle depletion, moderate muscle depletion.  Being addressed via TF   GOAL:   Patient will meet greater than or equal to 90% of their needs  Met  MONITOR:   Vent status, TF tolerance, Labs, Weight trends  REASON FOR ASSESSMENT:   Ventilator    ASSESSMENT:    78 yo male admitted with acute on chronic CHF, developed respiratory distress requiring intubation, in shock requiring pressors, also AKI on CKD. PMH includes chronic combined CHF due NICM, with ECHO 2019 EF 25-30%, CKD III. presume NASH, esophageal varices, Barrett's esophagus, HTN, HLD, DM, GERD  2/28 Decompensated with multiple episodes of VT complicated by respiratory failure requiring intubation and  Cardioversion  Diuretics on hold due to worsening renal function; nephrology indicating pt may require CRRT Tolerating SBT this AM but holding on extubation for at least 24 hours per MD note  Patient is currently intubated on ventilator support MV: 9 L/min Temp (24hrs), Avg:98.4 F (36.9 C), Min:97.7 F (36.5 C), Max:99.1 F (37.3 C)  Vital 1.5 at 40 ml/hr, Pro-Stat 30 mL TID via OG tube  Current wt 88.2 kg; admission weight 82 kg  Labs: BUN 110, Creatinine 5.06, sodium 130 (L), potassium 3.2 (L) Meds: MVI, KCL, B-12   Diet Order:   Diet Order            Diet NPO time specified  Diet effective now              EDUCATION NEEDS:   Not appropriate for education at this time  Skin:  Skin Assessment: Reviewed RN Assessment  Last BM:  2/27  Height:    Ht Readings from Last 1 Encounters:  06/16/18 5' 10"  (1.778 m)    Weight:   Wt Readings from Last 1 Encounters:  06/18/18 88.2 kg    Ideal Body Weight:  75.5 kg  BMI:  Body mass index is 27.9 kg/m.  Estimated Nutritional Needs:   Kcal:  1726 kcals   Protein:  95-130 g   Fluid:  per MD  Kerman Passey MS, RD, LDN, CNSC [phone] Pager  [phone] Weekend/On-Call Pager

## 2018-06-18 NOTE — Progress Notes (Addendum)
Advanced Heart Failure Rounding Note   Subjective:    Remains intubated. Resting on vent  Has some hematuria overnight. Now clearing. UA with 11-20 WBCs  Diuresed with 120 IV lasix. Weight down 2 pounds. CVP 12-13 (checked personally). BMET pending  On NE 12   V pacing at 90   Objective:   Weight Range:  Vital Signs:   Temp:  [97.7 F (36.5 C)-98.4 F (36.9 C)] 98.4 F (36.9 C) (03/05 0300) Pulse Rate:  [81-92] 89 (03/05 0700) Resp:  [6-33] 16 (03/05 0700) BP: (86-115)/(54-73) 99/65 (03/05 0700) SpO2:  [94 %-100 %] 95 % (03/05 0700) FiO2 (%):  [40 %] 40 % (03/05 0340) Weight:  [88.2 kg] 88.2 kg (03/05 0229) Last BM Date: 06/12/2018  Weight change: Filed Weights   06/16/18 0400 06/17/18 0600 06/18/18 0229  Weight: 85.9 kg 85.7 kg 88.2 kg    Intake/Output:   Intake/Output Summary (Last 24 hours) at 06/18/2018 0716 Last data filed at 06/18/2018 0700 Gross per 24 hour  Intake 2455.38 ml  Output 2195 ml  Net 260.38 ml    Physical Exam: CVP 12-13 General:  Awake on vent HEENT: normal Neck: supple. JVP to jaw Carotids 2+ bilat; no bruits. No lymphadenopathy or thryomegaly appreciated. Cor: PMI nondisplaced. Regular rate & rhythm. No rubs, gallops or murmurs. Lungs: clear Abdomen: soft, nontender, mildly distended. No hepatosplenomegaly. No bruits or masses. Good bowel sounds. Extremities: no cyanosis, clubbing, rash, 2+ edema Neuro: awake on vent  Moves all 4    Telemetry: A sensed V paced 90s Personally reviewed    Labs: Basic Metabolic Panel: Recent Labs  Lab 06/13/18 0427 06/13/18 1634 06/14/18 0325 06/14/18 1709 06/15/18 0252 06/16/18 0401 06/16/18 0407 06/17/18 0440 06/18/18 0221  NA 135  --  133*  --  133* 132* 131* 132* 130*  K 4.3  --  4.1  --  3.7 3.7 3.8 3.3* 3.2*  CL 96*  --  96*  --  93* 90*  --  88* 86*  CO2 24  --  23  --  28 26  --  30 31  GLUCOSE 153*  --  172*  --  191* 209*  --  209* 166*  BUN 35*  --  48*  --  63* 81*  --  96*  110*  CREATININE 2.62*  --  3.37*  --  3.92* 4.41*  --  5.02* 5.06*  CALCIUM 8.9  --  8.4*  --  7.8* 8.3*  --  8.1* 8.0*  MG 2.5* 2.4 2.4 2.4  --  2.2  --   --  2.3  PHOS 5.7* 5.3* 5.1* 4.3  --  3.3  --   --   --     Liver Function Tests: Recent Labs  Lab 06/12/18 2158 06/17/18 0913 06/18/18 0221  AST 65* 49* 45*  ALT _0 ALKPHOS 147* 159* 189*  BILITOT 1.7* 0.9 0.9  PROT 6.1* 5.3* 5.6*  ALBUMIN 2.9* 2.1* 2.1*   No results for input(s): LIPASE, AMYLASE in the last 168 hours. No results for input(s): AMMONIA in the last 168 hours.  CBC: Recent Labs  Lab 06/12/18 2158 06/13/18 0427 06/16/18 0401 06/16/18 0407 06/16/18 2324 06/17/18 0440 06/18/18 0221  WBC 12.3* 13.1* 12.7*  --  10.2 9.4 9.0  NEUTROABS 9.6*  --   --   --   --   --   --   HGB 14.0 14.6 11.4* 11.9* 11.0* 10.7* 11.0*  HCT  44.0 45.9 33.8* 35.0* 32.0* 31.4* 32.5*  MCV 98.9 98.9 93.6  --  92.8 93.5 91.5  PLT 217 240 192  --  184 171 186    Cardiac Enzymes: No results for input(s): CKTOTAL, CKMB, CKMBINDEX, TROPONINI in the last 168 hours.  BNP: BNP (last 3 results) Recent Labs    11/05/17 1310 12/02/17 1846 01/05/18 1431  BNP 515.3* 349.6* 516.7*    ProBNP (last 3 results) No results for input(s): PROBNP in the last 8760 hours.    Other results:  Imaging: Us Renal  Result Date: 06/17/2018 CLINICAL DATA:  Acute kidney injury EXAM: RENAL / URINARY TRACT ULTRASOUND COMPLETE COMPARISON:  Ultrasound abdomen 02/11/2018 FINDINGS: Right Kidney: Renal measurements: 11.1 x 5.5 x 5.9 cm = volume: 191 mL. Increased echogenicity. Right upper pole cyst 2.4 cm. Right midpole cyst 2.3 cm. Negative for obstruction or mass. Left Kidney: Renal measurements: 11.4 x 6.1 x 6.8 cm. = volume: 250 mL. Mild increased echogenicity of the renal cortex. No obstruction or mass. Bladder: Foley catheter.  Bladder empty Bilateral pleural effusions.  Moderate ascites. IMPRESSION: Mild increased echogenicity of the kidneys.  Right renal cysts. Negative for obstruction Ascites Bilateral pleural effusions Electronically Signed   By: Charles  Clark M.D.   On: 06/17/2018 13:41   Dg Chest Port 1 View  Result Date: 06/17/2018 CLINICAL DATA:  Check endotracheal tube placement EXAM: PORTABLE CHEST 1 VIEW COMPARISON:  06/16/2018 FINDINGS: Cardiac shadow remains enlarged. Pacing device is again seen. Endotracheal tube and gastric catheter are noted although the proximal side port of the gastric catheter lies at the level of the gastroesophageal junction but stable. Right jugular central lines are noted in the distal superior vena cava. Bilateral pleural effusions are seen and stable. Bibasilar atelectatic changes and left lower lobe consolidation is noted. Central vascular congestion is again seen. IMPRESSION: Changes of CHF with bilateral effusions. Bibasilar atelectasis and left retrocardiac consolidation is seen. Tubes and lines as described stable from the prior exam. Electronically Signed   By: Mark  Lukens M.D.   On: 06/17/2018 08:38     Medications:     Scheduled Medications: . chlorhexidine gluconate (MEDLINE KIT)  15 mL Mouth Rinse BID  . Chlorhexidine Gluconate Cloth  6 each Topical Q0600  . feeding supplement (PRO-STAT SUGAR FREE 64)  30 mL Per Tube TID  . feeding supplement (VITAL 1.5 CAL)  1,000 mL Per Tube Q24H  . gabapentin  400 mg Oral QHS  . insulin aspart  0-9 Units Subcutaneous Q4H  . mouth rinse  15 mL Mouth Rinse 10 times per day  . Melatonin  6 mg Oral QHS  . multivitamin with minerals  1 tablet Oral Daily  . ondansetron  4 mg Oral BID  . pantoprazole sodium  40 mg Per Tube Daily  . rosuvastatin  10 mg Oral Daily  . sertraline  25 mg Oral Daily  . sodium chloride flush  10-40 mL Intracatheter Q12H  . tamsulosin  0.4 mg Oral Daily  . traZODone  50 mg Oral QHS  . vitamin B-12  500 mcg Oral QODAY  . zolpidem  5 mg Oral QHS    Infusions: . sodium chloride Stopped (06/18/18 0610)  . amiodarone 30  mg/hr (06/18/18 0700)  . heparin 850 Units/hr (06/18/18 0700)  . midazolam Stopped (06/18/18 0407)  . 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 He is on home milrinone 0.125 followed by [facility].   He was admitted for RV lead revision. Admission complicated by recurrent HF in setting of AF with RVR. On 2/28 decompensated with multiple episodes of VT complicated by respiratory failure leading to intubation and DC-CV   Plan/Discussion:    1. Acute on chronic systolic HF -> cardiogenic shock -NICM by cath at [facility] 09/2016 -Echo 06/2017: EF 25-30%, mild LAE, mild/mod RV dilation, mod/severe RA. Echo 10/2017: EF 10% -Echo12/19 EF 35-40%. He has been on home milrinone -PYP scan strongly suggestive of TTR amyloid, SPEP with 0.8% m-spike, UPEP with Bence Jones Protein positive, kappa type. 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 Dr Marin Olp saw and thinks he does not have AL amyloid (just multiple myeloma) and that he has TTR amyloid and should be treated with tafamidis before chemo. Tafamidis started on 11/14/17.  - Decompensated in setting of recurrent AF and VT - NE down to 12 mcg. Off milrinone.  CO-OX is 64% - Course c/b ATN and cardiorenal syndrome - Volume status elevated. Renal function seemed to plateau at 5.0 yesterday.  - Will repeat 120 IV lasix today - No ARB/entresto/spirowith for now with CKD - Has been off hydralazine/imdur. No b-blocker with low output  2.Paroxysmal  AtrialFlutter/Fib - s/p DCCV of  7/31, 8/2, 12/19/2017  -  s/p aflutter ablation 2/7 -  had recurrent AF on admit. S/p emergent DC-CV 2/28 - On 2/29 in junctional rhythm. Pacer now set DD at 90 to force AV pacing. -  Continue IV amio.  Failedpo amiodarone previously with recurrent nausea. But does not tolerate AF well and will need AA drug vs AV node ablation. D/w EP  - On heparin drip.   3. VT - improved with IV lidocaine on 2/27 - now quiescent on IV amio - Keep K> 4.0 mg > 2.0 -Supp K  4. Acute hypoxic respiratory failure - Stable on vent. Will attempt to diurese further today. If renal function improving and diuresing well may be able to attempt extubation today - CCM appreciated.    5. h/o junctional rhythm with symptomatic bradycardia -> MDT PPM.  -RV lead revised this admission. Currently stable - EP following  6. Acute on chronic renal failure - likely due to ATN and cardiorenal syndrome - baseline creatinine 1.6-1.8 - Creatinine continues to rise-->3.9-->4.4->5.0->5.06 -> pending . - Volume status elevated. Renal function seemed to plateau at 5.0 yesterday. Await BMET today -Support renal perfusion with inotropes. Will attempt to diurese further today. If renal function improving and diuresing well may be able to attempt extubation today - He would be ok with short-term CRRT but would be a candidate for (or want) long-term HD -Nephrology appreciated.   7. 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 Marin Olp . I spoke to him today regarding his admit.  -Started on Bortezomib7/18/18andRevlimid7/23/19. - Dr Marin Olp thinks he does not have AL amyloid and that he does have TTR amyloid and should be treated with tafamidis before chemo. ContinueTafamidis.  - Off tafamdis currently as pills can't be crushed  8. LUE Hematoma  Wrap removed. LUE stable. Resolved. Hgb stable.   9. Hyponatremia  Sodium down to 130.   10. Hypokalemia  Supp K to keep > 4.0  11. Hematuria/ UTI -  heparin adjusted. Start rocephin   Length of Stay: 5  CRITICAL CARE Performed by: ,   Total critical care time: 35 minutes  Critical care time was exclusive of separately billable procedures and treating other patients.  Critical care was necessary to treat or prevent imminent or life-threatening deterioration.  Critical care was time spent personally by me (independent of midlevel providers or residents) on the following activities: development of treatment plan with patient and/or surrogate as well as nursing, discussions with consultants, evaluation of patient's response to treatment, examination of patient, obtaining history from patient or surrogate, ordering and performing treatments and interventions, ordering and review of laboratory studies, ordering and review of radiographic studies, pulse oximetry and re-evaluation of patient's condition.     , MD  4:33 AM   Advanced Heart Failure Team Pager [phone] (M-F; 7a - 4p)  Please contact [facility] for night-coverage after hours (4p -7a ) and weekends on [email or web address]   Addendum:  Creatinine now going back up. 5.5 today with BUN 120. Will need to discuss plans with Renal. While we agree that he will likely tolerate CVVHD we all feel that once it is initiated his kidneys may not recover. Will hold lasix.    , MD  4:58 AM       

## 2018-06-18 NOTE — Progress Notes (Signed)
eLink Physician-Brief Progress Note Patient Name: Dillon Andrews DOB: Jul 05, 1940 MRN: [id number]   Date of Service  06/18/2018  HPI/Events of Note  Hematuria while on a Heparin infusion  eICU Interventions  Stat H & H ordered and will be repeated Q 4 hours x 3, stat PT,PTT, INR sent         Frederik Pear 06/18/2018, 11:15 PM

## 2018-06-18 NOTE — Consult Note (Signed)
   [facility] Inpatient Consult   06/18/2018  Zavien W Ivie 1940/12/30 [id number]  Patient screened for high risk score and hospitalizations to check if potential [facility] services are needed . Patient was on ventilator an planning for extubation on 06/19/2018 per MD notes. Will follow for progress and disposition.  Please place a [facility] consult or for questions contact:   Natividad Brood, RN BSN [facility] Liaison  [phone] business mobile phone Toll free office [phone]

## 2018-06-18 NOTE — Progress Notes (Signed)
[facility] NEPHROLOGY PROGRESS NOTE  Assessment/ Plan: Pt is a 78 y.o. yo male with hypertension, type 2 diabetes, K-LC myeloma, NASH, esophageal varices, A. fib, CHF on home milrinone, CKD stage III with creatinine 1.4-1.7 at baseline admitted for decompensated heart failure and respiratory failure.  #Acute kidney injury on CKD stage III, nonoliguric due to cardiorenal syndrome and acute worsening creatinine due to ischemic ATN related with  diuretics -Urine output 2100 cc in 24 hours, serum creatinine level is stable.  He has no edema and CVP around 10-12.  Ordered a dose of Lasix by cardiologist.  Continue to monitor urine output and renal function closely.  No plan for dialysis today.  I have discussed with Dr. [name].  May consider CRRT if renal function worsen in association with fluid overload.  However I am worried that the CRRT will trigger him to permanent dialysis dependent which she may not tolerate long-term. - Avoid IV contrast or hypotensive episode.  #Cardiogenic shock: On Levophed currently.  Heart failure team is following.  #Atrial fibrillation/flutter on status post DCCV, on amiodarone and heparin.  #Acute respiratory failure with hypoxia currently intubated.  Per PCCM  #Kappa Light chain myeloma and concern for cardiac amyloid: Status post treatment, follows with oncology.   Subjective: Seen and examined ICU.  Remains intubated.  He is alert awake and following commands.  On low-dose Levophed.  Receiving a dose of Lasix today. Objective Vital signs in last 24 hours: Vitals:   06/18/18 0645 06/18/18 0700 06/18/18 0738 06/18/18 0758  BP: 95/62 99/65  100/65  Pulse: 91 89  92  Resp: (!) 31 16  (!) 21  Temp:   99.1 F (37.3 C)   TempSrc:   Oral   SpO2: 95% 95%  97%  Weight:      Height:       Weight change: 2.5 kg  Intake/Output Summary (Last 24 hours) at 06/18/2018 0857 Last data filed at 06/18/2018 0800 Gross per 24 hour  Intake 2254.45 ml   Output 2055 ml  Net 199.45 ml       Labs: Basic Metabolic Panel: Recent Labs  Lab 06/14/18 0325 06/14/18 1709  06/16/18 0401 06/16/18 0407 06/17/18 0440 06/18/18 0221  NA 133*  --    < > 132* 131* 132* 130*  K 4.1  --    < > 3.7 3.8 3.3* 3.2*  CL 96*  --    < > 90*  --  88* 86*  CO2 23  --    < > 26  --  30 31  GLUCOSE 172*  --    < > 209*  --  209* 166*  BUN 48*  --    < > 81*  --  96* 110*  CREATININE 3.37*  --    < > 4.41*  --  5.02* 5.06*  CALCIUM 8.4*  --    < > 8.3*  --  8.1* 8.0*  PHOS 5.1* 4.3  --  3.3  --   --   --    < > = values in this interval not displayed.   Liver Function Tests: Recent Labs  Lab 06/12/18 2158 06/17/18 0913 06/18/18 0221  AST 65* 49* 45*  ALT _0 ALKPHOS 147* 159* 189*  BILITOT 1.7* 0.9 0.9  PROT 6.1* 5.3* 5.6*  ALBUMIN 2.9* 2.1* 2.1*   No results for input(s): LIPASE, AMYLASE in the last 168 hours. No results for input(s): AMMONIA in the last 168  hours. CBC: Recent Labs  Lab 06/12/18 2158 06/13/18 0427 06/16/18 0401  06/16/18 2324 06/17/18 0440 06/18/18 0221  WBC 12.3* 13.1* 12.7*  --  10.2 9.4 9.0  NEUTROABS 9.6*  --   --   --   --   --   --   HGB 14.0 14.6 11.4*   < > 11.0* 10.7* 11.0*  HCT 44.0 45.9 33.8*   < > 32.0* 31.4* 32.5*  MCV 98.9 98.9 93.6  --  92.8 93.5 91.5  PLT 217 240 192  --  184 171 186   < > = values in this interval not displayed.   Cardiac Enzymes: No results for input(s): CKTOTAL, CKMB, CKMBINDEX, TROPONINI in the last 168 hours. CBG: Recent Labs  Lab 06/17/18 1324 06/17/18 1614 06/17/18 2126 06/17/18 2348 06/18/18 0335  GLUCAP 171* 148* 187* 192* 160*    Iron Studies: No results for input(s): IRON, TIBC, TRANSFERRIN, FERRITIN in the last 72 hours. Studies/Results: US Renal  Result Date: 06/17/2018 CLINICAL DATA:  Acute kidney injury EXAM: RENAL / URINARY TRACT ULTRASOUND COMPLETE COMPARISON:  Ultrasound abdomen 02/11/2018 FINDINGS: Right Kidney: Renal measurements: 11.1 x 5.5 x  5.9 cm = volume: 191 mL. Increased echogenicity. Right upper pole cyst 2.4 cm. Right midpole cyst 2.3 cm. Negative for obstruction or mass. Left Kidney: Renal measurements: 11.4 x 6.1 x 6.8 cm. = volume: 250 mL. Mild increased echogenicity of the renal cortex. No obstruction or mass. Bladder: Foley catheter.  Bladder empty Bilateral pleural effusions.  Moderate ascites. IMPRESSION: Mild increased echogenicity of the kidneys. Right renal cysts. Negative for obstruction Ascites Bilateral pleural effusions Electronically Signed   By: Franchot Gallo M.D.   On: 06/17/2018 13:41   Dg Chest Port 1 View  Result Date: 06/17/2018 CLINICAL DATA:  Check endotracheal tube placement EXAM: PORTABLE CHEST 1 VIEW COMPARISON:  06/16/2018 FINDINGS: Cardiac shadow remains enlarged. Pacing device is again seen. Endotracheal tube and gastric catheter are noted although the proximal side port of the gastric catheter lies at the level of the gastroesophageal junction but stable. Right jugular central lines are noted in the distal superior vena cava. Bilateral pleural effusions are seen and stable. Bibasilar atelectatic changes and left lower lobe consolidation is noted. Central vascular congestion is again seen. IMPRESSION: Changes of CHF with bilateral effusions. Bibasilar atelectasis and left retrocardiac consolidation is seen. Tubes and lines as described stable from the prior exam. Electronically Signed   By: Inez Catalina M.D.   On: 06/17/2018 08:38    Medications: Infusions: . sodium chloride 5 mL/hr at 06/18/18 0800  . amiodarone 30 mg/hr (06/18/18 0800)  . furosemide    . heparin 900 Units/hr (06/18/18 0800)  . midazolam Stopped (06/18/18 0407)  . norepinephrine (LEVOPHED) Adult infusion 12 mcg/min (06/18/18 0800)    Scheduled Medications: . chlorhexidine gluconate (MEDLINE KIT)  15 mL Mouth Rinse BID  . Chlorhexidine Gluconate Cloth  6 each Topical Q0600  . feeding supplement (PRO-STAT SUGAR FREE 64)  30 mL Per  Tube TID  . feeding supplement (VITAL 1.5 CAL)  1,000 mL Per Tube Q24H  . gabapentin  400 mg Oral QHS  . insulin aspart  0-9 Units Subcutaneous Q4H  . mouth rinse  15 mL Mouth Rinse 10 times per day  . Melatonin  6 mg Oral QHS  . multivitamin  15 mL Oral Daily  . pantoprazole sodium  40 mg Per Tube Daily  . potassium chloride  20 mEq Oral Once  . potassium chloride  40 mEq Oral Once  . rosuvastatin  10 mg Oral Daily  . sertraline  25 mg Oral Daily  . sodium chloride flush  10-40 mL Intracatheter Q12H  . tamsulosin  0.4 mg Oral Daily  . traZODone  50 mg Oral QHS  . vitamin B-12  500 mcg Oral QODAY  . zolpidem  5 mg Oral QHS    have reviewed scheduled and prn medications.  Physical Exam: General: Intubated, alert Heart:RRR, s1s2 nl, no rubs Lungs: Coarse breath sound bilateral, no wheeze Abdomen:soft, Non-tender, non-distended Extremities: Trace edema Dialysis Access: None  Chandi Nicklin Prasad Anquinette Pierro 06/18/2018,8:57 AM  LOS: 5 days

## 2018-06-18 NOTE — Progress Notes (Signed)
Barre for Heparin -apixaban on hold Indication: atrial fibrillation  No Known Allergies  Patient Measurements: Height: 5' 10"  (177.8 cm) Weight: 194 lb 7.1 oz (88.2 kg) IBW/kg (Calculated) : 73   Vital Signs: Temp: 98.4 F (36.9 C) (03/05 0300) Temp Source: Oral (03/05 0300) BP: 99/65 (03/05 0700) Pulse Rate: 89 (03/05 0700)  Labs: Recent Labs    06/16/18 0401  06/16/18 1814 06/16/18 2324 06/17/18 0026 06/17/18 0440 06/17/18 0913 06/18/18 0221  HGB 11.4*   < >  --  11.0*  --  10.7*  --  11.0*  HCT 33.8*   < >  --  32.0*  --  31.4*  --  32.5*  PLT 192  --   --  184  --  171  --  186  APTT  --    < > 124* 56*  --   --  93* 65*  LABPROT  --   --   --   --  26.7*  --   --   --   INR  --   --   --   --  2.5*  --   --   --   HEPARINUNFRC  --   --  >2.20*  --   --   --  >2.20* >2.20*  CREATININE 4.41*  --   --   --   --  5.02*  --  5.06*   < > = values in this interval not displayed.    Estimated Creatinine Clearance: 13.5 mL/min (A) (by C-G formula based on SCr of 5.06 mg/dL (H)).   Assessment: 77yom intubated 2/27 to protect airway after having runs VT needing to be shocked.  VT quiet on amiodaone drip - EP following   Has been on apixaban for anticoaguation in setting of Afib.  Now with AKI Cr 5 and in ICU may need IV lines placed - will hold apixaban and start heparin drip.  CBC continues to be stable.  Apixaban can fasley elevated the HL will use aptt for heparin dosing until washout / correlation.   Large hematoma on L arm looks improved per NP.  Heparin drip 850 uts/hr aptt 65sec now below goal, will make small adjustment.   Goal of Therapy:  aptt 66-102 sec Heparin level 0.3-0.7 units/ml Monitor platelets by anticoagulation protocol: Yes   Plan:  Continue heparin infusion 900 units/hr Daily HL, APTT,CBC Monitor s/s bleeding and renal function  Erin Hearing PharmD., BCPS Clinical Pharmacist 06/18/2018 7:29  AM

## 2018-06-18 NOTE — Progress Notes (Signed)
eLink Physician-Brief Progress Note Patient Name: ODAS OZER DOB: 11-29-1940 MRN: [id number]   Date of Service  06/18/2018  HPI/Events of Note  Hypokalemia in the context of acute kidney injury  eICU Interventions  KCL 10 meq iv hourly x 2 doses        Kerry Kass Rufus Cypert 06/18/2018, 4:36 AM

## 2018-06-18 NOTE — Progress Notes (Addendum)
NAME:  Dillon Andrews, MRN:  [id number], DOB:  1940/07/26, LOS: 5 ADMISSION DATE:  05/18/2018, CONSULTATION DATE:  06/12/2018 REFERRING MD:  Sela Hua, MD, CHIEF COMPLAINT:  Respiratory failure  Brief History   Dillon Andrews is a 78 YO gentleman with history significant for Chronic systolic HF on home milrinone due to TTR amyloidosis, atrial flutter, pace maker placement due to complete heart block with recent RV lead revision, chronic anticoagulation, NASH with esophageal varices  and CKD who is transferred to the CCU from the floor after he developed multiple episodes of rapid VT with hemodynamic instability requiring intubation and cardioversion  Past Medical History  AFlutter: H/o ablation, known to have multiple circuits, NASH W/esophageal varicies NICM, Chronic CHF On home milrinone, DM, HTN, CKD (III)  Significant Hospital Events   -Endotracheal intubation 2/28 -Right IJ central line 2/28 -Left radial A-line 2/28 -DC cardioversion 2/28 3/3 still on pressors and lasix gtt. Starting PSV trial 3/4: Lasix and additional diuretics all discontinued due to rising creatinine up to 5.  Still requiring norepinephrine however weaning this down slowly.  Developed increased accessory use when pressure support limited to 5.  Chest x-ray unchanged with bilateral pleural effusions and pulmonary edema.  Nephrology approaching family about possible dialysis 3/5: Reattempting diuresis.  Chest x-ray unchanged.  Renal function seems to have hit a plateau.  Trying to avoid dialysis Consults:  -PCCM  Procedures:  As above  Significant Diagnostic Tests:  05/22/2018: EPS/ablation:  1. Right atrial flutter upon presentation. 2. Successful radiofrequency ablation of atrial flutter along the cavotricuspid isthmus with complete bidirectional isthmus block achieved. 3. No early apparent complications.   Echo 12.31/2019 - Left ventricle: The cavity size was normal. Wall thickness was   increased in a  pattern of severe LVH. Systolic function was   moderately reduced. The estimated ejection fraction was in the   range of 35% to 40%. Diffuse hypokinesis. Doppler parameters are   consistent with a reversible restrictive pattern, indicative of   decreased left ventricular diastolic compliance and/or increased   left atrial pressure (grade 3 diastolic dysfunction). - Ventricular septum: The contour showed diastolic flattening. - Aortic valve: Trileaflet; mildly thickened, mildly calcified   leaflets. - Mitral valve: There was mild regurgitation. - Left atrium: The atrium was moderately dilated. - Right ventricle: The cavity size was moderately dilated. Wall   thickness was normal. - Right atrium: The atrium was moderately dilated. - Pulmonary arteries: Systolic pressure was moderately to severely   increased. PA peak pressure: 68 mm Hg (S).    11/10/2017: TEE/DCCV - Left ventricle: The estimated ejection fraction was 10%. Diffusehypokinesis. There was spontaneous echo contrast, indicative ofstasis. No evidence of thrombus. - Aortic valve: No evidence of vegetation. - Mitral valve: No evidence of vegetation. There was mild regurgitation. - Left atrium: The atrium was moderately dilated. No evidence of thrombus in the atrial cavity or appendage. No evidence of thrombus in the atrial cavity or appendage. - Right ventricle: Systolic function was severely reduced. - Right atrium: The atrium was dilated. - Atrial septum: No defect or patent foramen ovale was identified. - Tricuspid valve: No evidence of vegetation. There was moderate regurgitation. - Pulmonic valve: No evidence of vegetation. Impressions: - Successful cardioversion. No cardiac source of emboli was indentified.  Micro Data:  -None   Antimicrobials:  -None   Subjective   No distress on pressure support ventilation  Objective   Blood pressure 100/65, pulse 92, temperature 99.1 F (37.3 C),  temperature  source Oral, resp. rate (Abnormal) 21, height 5' 10"  (1.778 m), weight 88.2 kg, SpO2 97 %. CVP:  [10 mmHg-13 mmHg] 11 mmHg  Vent Mode: PSV;CPAP FiO2 (%):  [40 %] 40 % Set Rate:  [16 bmp] 16 bmp Vt Set:  [580 mL] 580 mL PEEP:  [5 cmH20] 5 cmH20 Pressure Support:  [10 cmH20] 10 cmH20 Plateau Pressure:  [18 cmH20] 18 cmH20   Intake/Output Summary (Last 24 hours) at 06/18/2018 1041 Last data filed at 06/18/2018 1000 Gross per 24 hour  Intake 2026.78 ml  Output 2235 ml  Net -208.22 ml   Filed Weights   06/16/18 0400 06/17/18 0600 06/18/18 0229  Weight: 85.9 kg 85.7 kg 88.2 kg    Examination:  General: This is a 77 year old white male is currently resting in bed he is in no acute distress on pressure support ventilation HEENT normocephalic atraumatic no jugular venous distention Pulmonary: Diminished bases no accessory use on pressure support of 10.  Actually tolerating pressure support of 5 well Cardiac: Regular rate and rhythm Abdomen: Soft nontender Extremities: Dependent edema, scattered areas of ecchymosis, warm, dry. Neuro: Awake oriented no focal deficits GU: Concentrated urine  Assessment & Plan:    Acute hypoxic respiratory failure d/t acute pulmonary edema and pleural effusions His portable chest x-ray remains essentially unchanged with bilateral pleural effusions and pulmonary edema Tolerating spontaneous breathing trial Plan We are going to give him another 24 hours in effort to optimize his fluid balance  Continue pressor support as tolerated  VAP bundle  Reassess for weaning in a.m., hope to extubate in next 24 hours  I suspect he will end up needing therapeutic thoracentesis at some point, would prefer to do this off from ventilator   Cardiogenic Shock in setting of acute on chronic systolic and diastolic HF H/o TTR amyloid Plan Titrating norepinephrine  Lasix per cardiology  Holding other antihypertensives  Not on tafamidis due to it not beng able  to be  Crushed.   Multiple arrhythmias: VT/ fib/flutter Plan Continuing IV heparin and amiodarone  Acute on chronic Kidney disease  Mild metabolic alkalosis  It appears as though his creatinine has plateaued Plan Careful with diuresis  Maintain mean arterial pressure greater than 65  Renal dose medications  May yet need CRRT however I am very concerned about him tolerating intermittent dialysis   Hyperglycemia Plan Sliding scale insulin Anemia w/out evidence of bleeding Plan Continue to trend CBC  Best practice:  Diet: Nutrition consult to initiate tube feeds  Pain/Anxiety/Delirium protocol (if indicated): Fentanyl, Midazolam VAP protocol (if indicated): HoB elevated, PPI, Oral care  DVT prophylaxis: Eliquis GI prophylaxis: Pantoprazole  Glucose control: Maintain euglycemia  Mobility: PT and OT to evaluate and treat as appropriate  Code Status: Full code  Family Communication: Family updated  Disposition: He remains on the ventilator.  Looks comfortable on pressure support of 5.  Could probably be extubated but worry about volume status.  For now we will continue aggressive diuresis in hopes that we can facilitate extubation easier.  His renal function seems to have plateaued, I worry about diuresis exacerbating this further, we will have to see what his renal function does.  Would be ideal to avoid dialysis with this gentleman, I am not sure he would tolerate intermittent dialysis well if his renal function were to decline further  Erick Colace ACNP-BC [place] Pager # [phone] OR # [phone] if no answer  Attending Note:  78 year old male with CHF and  cardiogenic shock who presents to PCCM for respiratory failure due to pulmonary edema.  Weaning this AM.  On exam, weaning with crackles.  I reviewed CXR myself, ETT is in a good position.  Discussed with PCCM-NP.  Will continue weaning efforts.  Continue diureses.  Pressor support as ordered.  Goal is  to extubate in AM.  Heparin and amiodarone for a-fib with RVR.  PCCM will continue to follow.  The patient is critically ill with multiple organ systems failure and requires high complexity decision making for assessment and support, frequent evaluation and titration of therapies, application of advanced monitoring technologies and extensive interpretation of multiple databases.   Critical Care Time devoted to patient care services described in this note is  32  Minutes. This time reflects time of care of this signee Dr Jennet Maduro. This critical care time does not reflect procedure time, or teaching time or supervisory time of PA/NP/Med student/Med Resident etc but could involve care discussion time.  Rush Farmer, M.D. [facility] Pulmonary/Critical Care Medicine. Pager: [phone]. After hours pager: [phone].

## 2018-06-19 ENCOUNTER — Other Ambulatory Visit (HOSPITAL_COMMUNITY): Payer: Self-pay | Admitting: Internal Medicine

## 2018-06-19 ENCOUNTER — Encounter (HOSPITAL_COMMUNITY): Payer: Self-pay

## 2018-06-19 LAB — COOXEMETRY PANEL
Carboxyhemoglobin: 1.1 % (ref 0.5–1.5)
Methemoglobin: 1.5 % (ref 0.0–1.5)
O2 Saturation: 64.1 %
Total hemoglobin: 11.9 g/dL — ABNORMAL LOW (ref 12.0–16.0)

## 2018-06-19 LAB — HEMOGLOBIN AND HEMATOCRIT, BLOOD
HEMATOCRIT: 32.3 % — AB (ref 39.0–52.0)
Hemoglobin: 11.3 g/dL — ABNORMAL LOW (ref 13.0–17.0)

## 2018-06-19 LAB — BASIC METABOLIC PANEL
Anion gap: 14 (ref 5–15)
BUN: 120 mg/dL — ABNORMAL HIGH (ref 8–23)
CO2: 29 mmol/L (ref 22–32)
Calcium: 8.4 mg/dL — ABNORMAL LOW (ref 8.9–10.3)
Chloride: 88 mmol/L — ABNORMAL LOW (ref 98–111)
Creatinine, Ser: 5.55 mg/dL — ABNORMAL HIGH (ref 0.61–1.24)
GFR calc non Af Amer: 9 mL/min — ABNORMAL LOW (ref >60)
GFR, EST AFRICAN AMERICAN: 10 mL/min — AB (ref >60)
Glucose, Bld: 143 mg/dL — ABNORMAL HIGH (ref 70–99)
Potassium: 3.5 mmol/L (ref 3.5–5.1)
Sodium: 131 mmol/L — ABNORMAL LOW (ref 135–145)

## 2018-06-19 LAB — CBC
HCT: 32.3 % — ABNORMAL LOW (ref 39.0–52.0)
Hemoglobin: 11.3 g/dL — ABNORMAL LOW (ref 13.0–17.0)
MCH: 31.8 pg (ref 26.0–34.0)
MCHC: 35 g/dL (ref 30.0–36.0)
MCV: 91 fL (ref 80.0–100.0)
Platelets: 181 10*3/uL (ref 150–400)
RBC: 3.55 MIL/uL — AB (ref 4.22–5.81)
RDW: 19.3 % — ABNORMAL HIGH (ref 11.5–15.5)
WBC: 8.9 10*3/uL (ref 4.0–10.5)
nRBC: 0 % (ref 0.0–0.2)

## 2018-06-19 LAB — HEPARIN LEVEL (UNFRACTIONATED): Heparin Unfractionated: >2.2 IU/mL — ABNORMAL HIGH (ref 0.30–0.70)

## 2018-06-19 LAB — GLUCOSE, CAPILLARY
Glucose-Capillary: 131 mg/dL — ABNORMAL HIGH (ref 70–99)
Glucose-Capillary: 136 mg/dL — ABNORMAL HIGH (ref 70–99)
Glucose-Capillary: 117 mg/dL — ABNORMAL HIGH (ref 70–99)
Glucose-Capillary: 122 mg/dL — ABNORMAL HIGH (ref 70–99)
Glucose-Capillary: 129 mg/dL — ABNORMAL HIGH (ref 70–99)

## 2018-06-19 LAB — APTT: aPTT: 73 seconds — ABNORMAL HIGH (ref 24–36)

## 2018-06-19 MED ORDER — LORAZEPAM 2 MG/ML IJ SOLN
INTRAMUSCULAR | Status: AC
  Filled 2018-06-19: qty 1

## 2018-06-19 MED ORDER — POTASSIUM CHLORIDE CRYS ER 10 MEQ PO TBCR
20.0000 meq | EXTENDED_RELEASE_TABLET | Freq: Once | ORAL | Status: AC
  Administered 2018-06-19: 20 meq via ORAL
  Filled 2018-06-19: qty 2

## 2018-06-19 MED ORDER — ORAL CARE MOUTH RINSE
15.0000 mL | Freq: Two times a day (BID) | OROMUCOSAL | Status: DC
  Administered 2018-06-20 – 2018-06-22 (×3): 15 mL via OROMUCOSAL

## 2018-06-19 MED ORDER — POLYETHYLENE GLYCOL 3350 17 G PO PACK
17.0000 g | PACK | Freq: Every day | ORAL | Status: DC
  Administered 2018-06-19 – 2018-06-22 (×3): 17 g via ORAL
  Filled 2018-06-19 (×3): qty 1

## 2018-06-19 MED ORDER — LORAZEPAM 2 MG/ML IJ SOLN
0.2500 mg | Freq: Once | INTRAMUSCULAR | Status: AC
  Administered 2018-06-19: 0.25 mg via INTRAVENOUS
  Filled 2018-06-19: qty 1

## 2018-06-19 MED ORDER — SODIUM CHLORIDE 0.9 % IV SOLN
2.0000 g | INTRAVENOUS | Status: DC
  Administered 2018-06-19 – 2018-06-22 (×4): 2 g via INTRAVENOUS
  Filled 2018-06-19 (×4): qty 20

## 2018-06-19 MED ORDER — FUROSEMIDE 10 MG/ML IJ SOLN
120.0000 mg | Freq: Once | INTRAVENOUS | Status: DC
  Filled 2018-06-19: qty 12

## 2018-06-19 NOTE — Progress Notes (Addendum)
NAME:  Dillon Andrews, MRN:  [id number], DOB:  [birth year], LOS: 77 ADMISSION DATE:  06/01/2018, CONSULTATION DATE:  06/12/2018 REFERRING MD:  Sela Hua, MD, CHIEF COMPLAINT:  Respiratory failure  Brief History   Dillon Andrews is a 78 YO gentleman with history significant for Chronic systolic HF on home milrinone due to TTR amyloidosis, atrial flutter, pace maker placement due to complete heart block with recent RV lead revision, chronic anticoagulation, NASH with esophageal varices  and CKD who is transferred to the CCU from the floor after he developed multiple episodes of rapid VT with hemodynamic instability requiring intubation and cardioversion  Past Medical History  AFlutter: H/o ablation, known to have multiple circuits, NASH W/esophageal varicies NICM, Chronic CHF On home milrinone, DM, HTN, CKD (III)  Significant Hospital Events   -Endotracheal intubation 2/28 -Right IJ central line 2/28 -Left radial A-line 2/28 -DC cardioversion 2/28 3/3 still on pressors and lasix gtt. Starting PSV trial 3/4: Lasix and additional diuretics all discontinued due to rising creatinine up to 5.  Still requiring norepinephrine however weaning this down slowly.  Developed increased accessory use when pressure support limited to 5.  Chest x-ray unchanged with bilateral pleural effusions and pulmonary edema.  Nephrology approaching family about possible dialysis 3/5: Reattempting diuresis.  Chest x-ray unchanged.  Renal function seems to have hit a plateau.  Trying to avoid dialysis 3/6: Down 2 pounds, but creatinine now up to 5.5 from 5.  He has passed his spontaneous breathing trial,  giving him a trial of extubation Consults:  -PCCM  Procedures:  As above  Significant Diagnostic Tests:  05/22/2018: EPS/ablation:  1. Right atrial flutter upon presentation. 2. Successful radiofrequency ablation of atrial flutter along the cavotricuspid isthmus with complete bidirectional isthmus block achieved. 3. No  early apparent complications.   Echo 12.31/2019 - Left ventricle: The cavity size was normal. Wall thickness was   increased in a pattern of severe LVH. Systolic function was   moderately reduced. The estimated ejection fraction was in the   range of 35% to 40%. Diffuse hypokinesis. Doppler parameters are   consistent with a reversible restrictive pattern, indicative of   decreased left ventricular diastolic compliance and/or increased   left atrial pressure (grade 3 diastolic dysfunction). - Ventricular septum: The contour showed diastolic flattening. - Aortic valve: Trileaflet; mildly thickened, mildly calcified   leaflets. - Mitral valve: There was mild regurgitation. - Left atrium: The atrium was moderately dilated. - Right ventricle: The cavity size was moderately dilated. Wall   thickness was normal. - Right atrium: The atrium was moderately dilated. - Pulmonary arteries: Systolic pressure was moderately to severely   increased. PA peak pressure: 68 mm Hg (S).    11/10/2017: TEE/DCCV - Left ventricle: The estimated ejection fraction was 10%. Diffusehypokinesis. There was spontaneous echo contrast, indicative ofstasis. No evidence of thrombus. - Aortic valve: No evidence of vegetation. - Mitral valve: No evidence of vegetation. There was mild regurgitation. - Left atrium: The atrium was moderately dilated. No evidence of thrombus in the atrial cavity or appendage. No evidence of thrombus in the atrial cavity or appendage. - Right ventricle: Systolic function was severely reduced. - Right atrium: The atrium was dilated. - Atrial septum: No defect or patent foramen ovale was identified. - Tricuspid valve: No evidence of vegetation. There was moderate regurgitation. - Pulmonic valve: No evidence of vegetation. Impressions: - Successful cardioversion. No cardiac source of emboli was indentified.  Micro Data:  -None   Antimicrobials:  -None  Subjective     No distress resting comfortably  Objective   Blood pressure (Abnormal) 85/59, pulse 89, temperature 98 F (36.7 C), temperature source Oral, resp. rate (Abnormal) 22, height 5' 10"  (1.778 m), weight 87.3 kg, SpO2 96 %. CVP:  [9 mmHg-32 mmHg] 14 mmHg  Vent Mode: CPAP;PSV FiO2 (%):  [40 %] 40 % Set Rate:  [13 bmp-16 bmp] 16 bmp Vt Set:  [580 mL] 580 mL PEEP:  [5 cmH20] 5 cmH20 Pressure Support:  [5 cmH20-10 cmH20] 10 cmH20 Plateau Pressure:  [17 cmH20-19 cmH20] 18 cmH20   Intake/Output Summary (Last 24 hours) at 06/19/2018 0956 Last data filed at 06/19/2018 0900 Gross per 24 hour  Intake 1613.88 ml  Output 1760 ml  Net -146.12 ml   Filed Weights   06/17/18 0600 06/18/18 0229 06/19/18 0400  Weight: 85.7 kg 88.2 kg 87.3 kg    Examination:  General this is a pleasant 78 year old white male he is currently resting on pressure support ventilation HEENT normocephalic atraumatic no jugular venous distention mucous membranes are moist he is orally intubated Pulmonary: Diminished bases.  Bedside ultrasound evaluation shows bilateral small to moderate pleural effusions.  He can pull up to a liter tidal volume on pressure support of 5 with request to make deep breath.  Otherwise pulling 3 to 400 cc tidal volume and resting pressure support mode of 5 Cardiac: Regular rate and rhythm Abdomen: Soft nontender Extremities: Warm and dry brisk capillary refill, dependent edema with scattered areas of ecchymosis Neuro: Awake, oriented, follows commands. GU: Concentrated yellow urine.  Assessment & Plan:    Acute hypoxic respiratory failure d/t acute pulmonary edema and pleural effusions Passing spontaneous breathing trial this a.m. Bedside ultrasound evaluation shows small to moderate free-flowing bilateral pleural effusions Plan Extubate today Wean oxygen Pulse oximetry  Cardiogenic Shock in setting of acute on chronic systolic and diastolic HF H/o TTR amyloid Plan Titrating  norepinephrine  Holding antihypertensives  Not currently on his tafamidis    Multiple arrhythmias: VT/ fib/flutter Plan Continuing IV heparin Amiodarone per primary  Acute on chronic Kidney disease, with associated electrolyte imbalance Serum creatinine increased following diuretic attempt Plan Keep euvolemic Renal dose medications Avoid hypotension Deferring decision about CRRT to nephrology and primary service.  Although I remain concerned that he is not a good dialysis candidate.  We are attempting extubation today, I think the patient should be an active participant in this discussion  Hyperglycemia Plan Sliding scale insulin  Anemia w/out evidence of bleeding Plan Trending CBC  Possible urinary tract infection Plan Day #1 of 7 Rocephin per primary team  Best practice:  Diet: Nutrition consult to initiate tube feeds: Discontinue 3/6 Pain/Anxiety/Delirium protocol (if indicated): Fentanyl, Midazolam: Discontinued 3/6 VAP protocol (if indicated): HoB elevated, PPI, Oral care  DVT prophylaxis: Eliquis, placed on heparin  GI prophylaxis: Pantoprazole  Glucose control: Maintain euglycemia  Mobility: PT and OT to evaluate and treat as appropriate  Code Status: Full code  Family Communication: Family updated  Disposition: He remains critically ill requiring mechanical ventilation.  He is down 2 pounds following diuresis, however his creatinine has once again bumped.  I agree with holding diuresis.  I remain worried about his eligibility or appropriateness for dialysis.  Certainly he would not tolerate intermittent dialysis, and I am not sure he would agree to CRRT.  It seems if he can be extubated it would be advantageous for him to be an active part of this discussion.  He is passed a spontaneous breathing  trial so we will give him a trial of extubation today.  We will continue to defer hemodynamic management to advanced heart failure service   Erick Colace  ACNP-BC [place] Pager # [phone] OR # [phone] if no answer  Attending Note:  78 year old male with respiratory failure due to pulmonary edema and heart failure with cardiogenic shock.  On exam, he is alert and interactive, weaning well.  I reviewed CXR myself, ETT is in a good position.  Discussed with PCCM-NP.  Patient is weaning well.  Pressors demand is improving but remains on pressors.  Will proceed with extubation.  Will likely need CRRT but will defer to primary and family discussion.  Continue support otherwise at this point.  The patient is critically ill with multiple organ systems failure and requires high complexity decision making for assessment and support, frequent evaluation and titration of therapies, application of advanced monitoring technologies and extensive interpretation of multiple databases.   Critical Care Time devoted to patient care services described in this note is  31  Minutes. This time reflects time of care of this signee Dr Jennet Maduro. This critical care time does not reflect procedure time, or teaching time or supervisory time of PA/NP/Med student/Med Resident etc but could involve care discussion time.  Rush Farmer, M.D. [facility] Pulmonary/Critical Care Medicine. Pager: [phone]. After hours pager: [phone].

## 2018-06-19 NOTE — Procedures (Signed)
Extubation Procedure Note  Patient Details:   Name: KELVYN SCHUNK DOB: 1940-11-12 MRN: [id number]   Airway Documentation:    Vent end date: 06/19/18 Vent end time: 1026   Evaluation  O2 sats: stable throughout Complications: No apparent complications Patient did tolerate procedure well. Bilateral Breath Sounds: Clear, Diminished   Yes   Pt extubated to 2L N/C.  No stridor noted.  RN @ bedside.  Donnetta Hail 06/19/2018, 10:27 AM

## 2018-06-19 NOTE — Progress Notes (Signed)
Called to report results of H&H and Coag to Dr. Lucile Shutters. Urine has cleared back up, now faintly colored pink. Orders to continue to monitor and draw H&H q4h.

## 2018-06-19 NOTE — Progress Notes (Signed)
Advanced Heart Failure Rounding Note   Subjective:    Remains intubated.  Yesterday diuretics held due to worsening renal function and milrinone was stopped.    Norepi has been weaned to 12 mcg. CO-OX 69%. CVP 11-12 Weight up about 10 pounds from baseline   Follows commands.     Objective:   Weight Range:  Vital Signs:                 Temp:  [97.7 F (36.5 C)-98.4 F (36.9 C)] 98.4 F (36.9 C) (03/05 0300) Pulse Rate:  [81-92] 89 (03/05 0700) Resp:  [6-33] 16 (03/05 0700) BP: (86-115)/(54-73) 99/65 (03/05 0700) SpO2:  [94 %-100 %] 95 % (03/05 0700) FiO2 (%):  [40 %] 40 % (03/05 0340) Weight:  [88.2 kg] 88.2 kg (03/05 0229) Last BM Date: 05/16/2018  Weight change:      Filed Weights   06/16/18 0400 06/17/18 0600 06/18/18 0229  Weight: 85.9 kg 85.7 kg 88.2 kg    Intake/Output:             Intake/Output Summary (Last 24 hours) at 06/18/2018 0716 Last data filed at 06/18/2018 0700    Gross per 24 hour  Intake 2455.38 ml  Output 2195 ml  Net 260.38 ml               Physical Exam: CVP 11-12  General:  Awake. On Vent  HEENT: ETT  Neck: supple. RIJ Carotids 2+ bilat; no bruits. No lymphadenopathy or thryomegaly appreciated. Cor: PMI nondisplaced. Regular rate & rhythm. No rubs, gallops or murmurs. R upper chest tunneled cath Lungs: coarse Abdomen: soft, nontender, nondistended. No hepatosplenomegaly. No bruits or masses. Good bowel sounds. Extremities: no cyanosis, clubbing, rash, R and LLE SCDs Neuro: Awake  Telemetry: A sensed V paced 90s Personally reviewed    Labs: Basic Metabolic Panel: LastLabs             Recent Labs  Lab 06/13/18 0427 06/13/18 1634 06/14/18 0325 06/14/18 1709 06/15/18 0252 06/16/18 0401 06/16/18 0407 06/17/18 0440 06/18/18 0221  NA 135  --  133*  --  133* 132* 131* 132* 130*  K 4.3  --  4.1  --  3.7 3.7 3.8 3.3* 3.2*  CL 96*  --  96*  --  93* 90*  --  88* 86*  CO2 24  --  23  --  28 26  --  30 31    GLUCOSE 153*  --  172*  --  191* 209*  --  209* 166*  BUN 35*  --  48*  --  63* 81*  --  96* 110*  CREATININE 2.62*  --  3.37*  --  3.92* 4.41*  --  5.02* 5.06*  CALCIUM 8.9  --  8.4*  --  7.8* 8.3*  --  8.1* 8.0*  MG 2.5* 2.4 2.4 2.4  --  2.2  --   --  2.3  PHOS 5.7* 5.3* 5.1* 4.3  --  3.3  --   --   --       Liver Function Tests: LastLabs       Recent Labs  Lab 06/12/18 2158 06/17/18 0913 06/18/18 0221  AST 65* 49* 45*  ALT _0 ALKPHOS 147* 159* 189*  BILITOT 1.7* 0.9 0.9  PROT 6.1* 5.3* 5.6*  ALBUMIN 2.9* 2.1* 2.1*     LastLabs  No results for input(s): LIPASE, AMYLASE in the last 168 hours.   LastLabs  No results for input(s): AMMONIA in the last 168 hours.    CBC: LastLabs           Recent Labs  Lab 06/12/18 2158 06/13/18 0427 06/16/18 0401 06/16/18 0407 06/16/18 2324 06/17/18 0440 06/18/18 0221  WBC 12.3* 13.1* 12.7*  --  10.2 9.4 9.0  NEUTROABS 9.6*  --   --   --   --   --   --   HGB 14.0 14.6 11.4* 11.9* 11.0* 10.7* 11.0*  HCT 44.0 45.9 33.8* 35.0* 32.0* 31.4* 32.5*  MCV 98.9 98.9 93.6  --  92.8 93.5 91.5  PLT 217 240 192  --  184 171 186      Cardiac Enzymes: LastLabs  No results for input(s): CKTOTAL, CKMB, CKMBINDEX, TROPONINI in the last 168 hours.    BNP: BNP (last 3 results) RecentLabs(withinlast365days)       Recent Labs    11/05/17 1310 12/02/17 1846 01/05/18 1431  BNP 515.3* 349.6* 516.7*      ProBNP (last 3 results) RecentLabs(withinlast365days)  No results for input(s): PROBNP in the last 8760 hours.      Other results:  Imaging: ImagingResults(Last48hours)  US Renal  Result Date: 06/17/2018 CLINICAL DATA:  Acute kidney injury EXAM: RENAL / URINARY TRACT ULTRASOUND COMPLETE COMPARISON:  Ultrasound abdomen 02/11/2018 FINDINGS: Right Kidney: Renal measurements: 11.1 x 5.5 x 5.9 cm = volume: 191 mL. Increased echogenicity. Right upper pole cyst 2.4 cm. Right midpole cyst  2.3 cm. Negative for obstruction or mass. Left Kidney: Renal measurements: 11.4 x 6.1 x 6.8 cm. = volume: 250 mL. Mild increased echogenicity of the renal cortex. No obstruction or mass. Bladder: Foley catheter.  Bladder empty Bilateral pleural effusions.  Moderate ascites. IMPRESSION: Mild increased echogenicity of the kidneys. Right renal cysts. Negative for obstruction Ascites Bilateral pleural effusions Electronically Signed   By: Franchot Gallo M.D.   On: 06/17/2018 13:41   Dg Chest Port 1 View  Result Date: 06/17/2018 CLINICAL DATA:  Check endotracheal tube placement EXAM: PORTABLE CHEST 1 VIEW COMPARISON:  06/16/2018 FINDINGS: Cardiac shadow remains enlarged. Pacing device is again seen. Endotracheal tube and gastric catheter are noted although the proximal side port of the gastric catheter lies at the level of the gastroesophageal junction but stable. Right jugular central lines are noted in the distal superior vena cava. Bilateral pleural effusions are seen and stable. Bibasilar atelectatic changes and left lower lobe consolidation is noted. Central vascular congestion is again seen. IMPRESSION: Changes of CHF with bilateral effusions. Bibasilar atelectasis and left retrocardiac consolidation is seen. Tubes and lines as described stable from the prior exam. Electronically Signed   By: Inez Catalina M.D.   On: 06/17/2018 08:38       Medications:     Scheduled Medications:  .  chlorhexidine gluconate (MEDLINE KIT)   15 mL  Mouth Rinse  BID   .  Chlorhexidine Gluconate Cloth   6 each  Topical  Q0600   .  feeding supplement (PRO-STAT SUGAR FREE 64)   30 mL  Per Tube  TID   .  feeding supplement (VITAL 1.5 CAL)   1,000 mL  Per Tube  Q24H   .  gabapentin   400 mg  Oral  QHS   .  insulin aspart   0-9 Units  Subcutaneous  Q4H   .  mouth rinse   15 mL  Mouth Rinse  10 times per day   .  Melatonin   6 mg  Oral  QHS   .  multivitamin with minerals    1 tablet  Oral  Daily   .  ondansetron   4 mg  Oral  BID   .  pantoprazole sodium   40 mg  Per Tube  Daily   .  rosuvastatin   10 mg  Oral  Daily   .  sertraline   25 mg  Oral  Daily   .  sodium chloride flush   10-40 mL  Intracatheter  Q12H   .  tamsulosin   0.4 mg  Oral  Daily   .  traZODone   50 mg  Oral  QHS   .  vitamin B-12   500 mcg  Oral  QODAY   .  zolpidem   5 mg  Oral  QHS     Infusions:  .  sodium chloride  Stopped (06/18/18 0610)   .  amiodarone  30 mg/hr (06/18/18 0700)   .  heparin  850 Units/hr (06/18/18 0700)   .  midazolam  Stopped (06/18/18 0407)   .  norepinephrine (LEVOPHED) Adult infusion  12 mcg/min (06/18/18 0700)     PRN Medications:  acetaminophen, alum & mag hydroxide-simeth, fentaNYL (SUBLIMAZE) injection, hydrocortisone, ondansetron (ZOFRAN) IV, promethazine, sodium chloride flush, traMADol   Assessment:   Nil Xiong Knightis a 77 y.o.malewith a hx of chronic combined CHF due to NICM with probable TTR amyloid, CKD III,presumedNASH(per pt, never proven),esophageal varices,  RBBB, PAF requiring multiple DCCV and now s/p atrial flutter ablation 05/22/18. He is on home milrinone 0.125 followed by [facility].   He was admitted for RV lead revision. Admission complicated by recurrent HF in setting of AF with RVR. On 2/28 decompensated with multiple episodes of VT complicated by respiratory failure leading to intubation and DC-CV   Plan/Discussion:    1.Acute on chronic systolic HF -> cardiogenic shock -NICM by cath at [facility] 09/2016 -Echo 06/2017: EF 25-30%, mild LAE, mild/mod RV dilation, mod/severe RA. Echo 10/2017: EF 10% -Echo12/19 EF 35-40%. He has been on home milrinone -PYP scan strongly suggestive of TTR amyloid, SPEP with 0.8% m-spike, UPEP with Bence Jones Protein positive, kappa type. 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 Dr Marin Olp saw and thinks he does not have  AL amyloid (just multiple myeloma) and that he has TTR amyloid and should be treated with tafamidis before chemo. Tafamidis started on 11/14/17.  - Decompensated in setting of recurrent AF and VT - NE down to 12 mcg. Off milrinone.  CO-OX is 69%.   - Course c/b ATN and cardiorenal syndrome - Continue to hold lasix and metolazone.  - No ARB/entresto/spirowith for now with CKD - Has been offhydralazine/imdur. No b-blocker with low output  2.Paroxysmal  AtrialFlutter/Fib - s/p DCCV of 7/31, 8/2, 12/19/2017  -  s/p aflutter ablation 2/7 -  had recurrent AF on admit. S/p emergent DC-CV 2/28 - On 2/29 in junctional rhythm. Pacer now set DD at 90 to force AV pacing. -  Continue IV amio.  Failedpo amiodarone previously with recurrent nausea.But does not tolerate AF well and will need AA drug - On heparin drip.   3. VT - improved with IV lidocaine on 2/27 - now quiescent on IV amio - Keep K> 4.0 mg > 2.0 -Supp K  4. Acute hypoxic respiratory failure - Stable on vent. CXR for today pending.  - CCM appreciated.    5. h/o junctional rhythm with symptomatic bradycardia -> MDT PPM.  -RV lead revised this  admission. Currently stable - EP following  6. Acute on chronic renal failure - likely due to ATN and cardiorenal syndrome - baseline creatinine 1.6-1.8 - Creatinine continues to rise-->3.9-->4.4->5.0->5.06  . -Support renal perfusion with inotropes. Hold diuretics.  -Nephrology appreciated.   7. 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 Marin Olp . I spoke to him today regarding his admit.  -Started on Bortezomib7/18/18andRevlimid7/23/19. - Dr Marin Olp thinks he does not have AL amyloid and that he does have TTR amyloid and should be treated with tafamidis before chemo.ContinueTafamidis.  - Off tafamdis currently as pills can't be crushed  8. LUE Hematoma  Wrap removed. LUE stable. Resolved. Hgb stable.   9. Hyponatremia  Sodium down to  130.   10. Hypokalemia Supp K   Length of Stay: 5   Amy Clegg NP-C  06/18/2018, 7:16 AM  Advanced Heart Failure Team Pager [phone] (M-F; [place])  Please contact [facility] for night-coverage after hours (4p -7a ) and weekends on [email or web address]   Agree with above.   Remains intubated. On NE 12 (decreasing). Co-ox 69% Now off milrinone. Diuretics held yesterday but still with > 2L U/O. Weight climbing back up. Creatinine now starting to plateau. Remains v-paced at 31. Underlying rhythm likely junctional or sinus but still having salvos of atrial tach.   Intubated. Awake following commands Jvp to jaw Cor RRR Lungs clear Ab distended NT Ext warm 1+ edema   Remains very tenuous. On vent. Co-ox stable on NE and we are weaning dose slowly. Main issue now is AKI. Creatinine rise seems to be plateauing and urine output remains good. Will give On dose 120 IV lasix today and assess response. Hopefully will recover soon. Atrial arrhythmias continue to be troublesome may eventually need AV node ablation. Continue heparin. Discussed dosing with PharmD personally.Family meeting today to discuss   CRITICAL CARE Performed by: Glori Bickers  Total critical care time: 45 minutes  Critical care time was exclusive of separately billable procedures and treating other patients.  Critical care was necessary to treat or prevent imminent or life-threatening deterioration.  Critical care was time spent personally by me (independent of midlevel providers or residents) on the following activities: development of treatment plan with patient and/or surrogate as well as nursing, discussions with consultants, evaluation of patient's response to treatment, examination of patient, obtaining history from patient or surrogate, ordering and performing treatments and interventions, ordering and review of laboratory studies, ordering and review of radiographic studies, pulse oximetry and  re-evaluation of patient's condition.  Glori Bickers, MD  8:17 AM

## 2018-06-19 NOTE — Progress Notes (Addendum)
[facility] PROGRESS NOTE  Assessment/ Plan: Pt is a 78 y.o. yo male with hypertension, type 2 diabetes, K-LC myeloma, NASH, esophageal varices, A. fib, CHF on home milrinone, CKD stage III with creatinine 1.4-1.7 at baseline admitted for decompensated heart failure and respiratory failure.  #Acute kidney injury on CKD stage III, nonoliguric due to cardiorenal syndrome and acute worsening creatinine due to ischemic ATN related with  diuretics -Patient received a dose of Lasix yesterday with urine output 1810 cc in 24 hours, serum creatinine level worsened to 5.55.  He has no edema and CVP around 10-12.  Plan for extubation today.  Patient will need CRRT to optimize volume especially if extubation is a problem.  However I am concerned that CRRT will put him to permanent dialysis which he may not tolerated in long-term.  I have discussed with the heart failure team and patient's son as well.  There is no absolute urgent indication for starting dialysis today.  Continue to follow and have discussion closely.  Maybe I can discuss with the patient when he is extubated. - Avoid IV contrast or hypotensive episode.  #Hyponatremia, hypervolemic: Serum sodium level stable.  #Cardiogenic shock: Blood pressure low today, on Levophed with titration.  Heart failure team is following.  #Atrial fibrillation/flutter on status post DCCV, on amiodarone and heparin.  #Acute respiratory failure with hypoxia currently intubated.  Per PCCM, plan for extubation today noted.  #Kappa Light chain myeloma and concern for cardiac amyloid: Status post treatment, follows with oncology.   Subjective: Seen and examined ICU.  Remains intubated.  He is alert awake and following some commands.  His on at bedside Objective Vital signs in last 24 hours: Vitals:   06/19/18 0830 06/19/18 0845 06/19/18 0846 06/19/18 0900  BP: (!) 78/61 92/67  (!) 85/59  Pulse: 90 90 86 89  Resp: 18 19 19  (!) 22   Temp:      TempSrc:      SpO2: 95% 95% 94% 96%  Weight:      Height:       Weight change: -0.9 kg  Intake/Output Summary (Last 24 hours) at 06/19/2018 0922 Last data filed at 06/19/2018 0900 Gross per 24 hour  Intake 1613.88 ml  Output 1760 ml  Net -146.12 ml       Labs: Basic Metabolic Panel: Recent Labs  Lab 06/14/18 0325 06/14/18 1709  06/16/18 0401  06/18/18 0221 06/18/18 0645 06/19/18 0344  NA 133*  --    < > 132*   < > 130* 132* 131*  K 4.1  --    < > 3.7   < > 3.2* 3.7 3.5  CL 96*  --    < > 90*   < > 86* 87* 88*  CO2 23  --    < > 26   < > 31 30 29   GLUCOSE 172*  --    < > 209*   < > 166* 164* 143*  BUN 48*  --    < > 81*   < > 110* 111* 120*  CREATININE 3.37*  --    < > 4.41*   < > 5.06* 5.11* 5.55*  CALCIUM 8.4*  --    < > 8.3*   < > 8.0* 8.2* 8.4*  PHOS 5.1* 4.3  --  3.3  --   --   --   --    < > = values in this interval not displayed.   Liver Function Tests:  Recent Labs  Lab 06/12/18 2158 06/17/18 0913 06/18/18 0221  AST 65* 49* 45*  ALT 30 14 13   ALKPHOS 147* 159* 189*  BILITOT 1.7* 0.9 0.9  PROT 6.1* 5.3* 5.6*  ALBUMIN 2.9* 2.1* 2.1*   No results for input(s): LIPASE, AMYLASE in the last 168 hours. No results for input(s): AMMONIA in the last 168 hours. CBC: Recent Labs  Lab 06/12/18 2158  06/16/18 0401  06/16/18 2324 06/17/18 0440 06/18/18 0221 06/18/18 2319 06/19/18 0344 06/19/18 0628  WBC 12.3*   < > 12.7*  --  10.2 9.4 9.0  --  8.9  --   NEUTROABS 9.6*  --   --   --   --   --   --   --   --   --   HGB 14.0   < > 11.4*   < > 11.0* 10.7* 11.0* 11.2* 11.3* 11.3*  HCT 44.0   < > 33.8*   < > 32.0* 31.4* 32.5* 32.6* 32.3* 32.3*  MCV 98.9   < > 93.6  --  92.8 93.5 91.5  --  91.0  --   PLT 217   < > 192  --  184 171 186  --  181  --    < > = values in this interval not displayed.   Cardiac Enzymes: No results for input(s): CKTOTAL, CKMB, CKMBINDEX, TROPONINI in the last 168 hours. CBG: Recent Labs  Lab 06/18/18 1612  06/18/18 2014 06/18/18 2357 06/19/18 0356 06/19/18 0739  GLUCAP 161* 125* 133* 131* 136*    Iron Studies: No results for input(s): IRON, TIBC, TRANSFERRIN, FERRITIN in the last 72 hours. Studies/Results: US Renal  Result Date: 06/17/2018 CLINICAL DATA:  Acute kidney injury EXAM: RENAL / URINARY TRACT ULTRASOUND COMPLETE COMPARISON:  Ultrasound abdomen 02/11/2018 FINDINGS: Right Kidney: Renal measurements: 11.1 x 5.5 x 5.9 cm = volume: 191 mL. Increased echogenicity. Right upper pole cyst 2.4 cm. Right midpole cyst 2.3 cm. Negative for obstruction or mass. Left Kidney: Renal measurements: 11.4 x 6.1 x 6.8 cm. = volume: 250 mL. Mild increased echogenicity of the renal cortex. No obstruction or mass. Bladder: Foley catheter.  Bladder empty Bilateral pleural effusions.  Moderate ascites. IMPRESSION: Mild increased echogenicity of the kidneys. Right renal cysts. Negative for obstruction Ascites Bilateral pleural effusions Electronically Signed   By: Franchot Gallo M.D.   On: 06/17/2018 13:41   Dg Chest Port 1 View  Result Date: 06/18/2018 CLINICAL DATA:  History of pulmonary edema. EXAM: PORTABLE CHEST 1 VIEW COMPARISON:  Chest radiograph 06/17/2018 FINDINGS: ET tube mid trachea. Right IJ central venous catheter tips project over the superior vena cava. Multi lead pacer apparatus similar position. Monitoring leads overlie the patient. Enteric tube courses inferior to the diaphragm. Cardiomegaly. Moderate layering bilateral pleural effusions with underlying opacities. No pneumothorax. IMPRESSION: Moderate layering bilateral effusions with underlying opacities favored to represent combination of atelectasis and edema. Cardiomegaly. Stable support apparatus. Electronically Signed   By: Lovey Newcomer M.D.   On: 06/18/2018 09:43    Medications: Infusions: . sodium chloride 5 mL/hr at 06/19/18 0900  . amiodarone 30 mg/hr (06/19/18 0900)  . cefTRIAXone (ROCEPHIN)  IV Stopped (06/19/18 0558)  . heparin 900  Units/hr (06/19/18 0900)  . midazolam Stopped (06/18/18 0407)  . norepinephrine (LEVOPHED) Adult infusion 11 mcg/min (06/19/18 0900)    Scheduled Medications: . chlorhexidine gluconate (MEDLINE KIT)  15 mL Mouth Rinse BID  . Chlorhexidine Gluconate Cloth  6 each Topical Q0600  . feeding  supplement (PRO-STAT SUGAR FREE 64)  30 mL Per Tube TID  . feeding supplement (VITAL 1.5 CAL)  1,000 mL Per Tube Q24H  . gabapentin  400 mg Oral QHS  . insulin aspart  0-9 Units Subcutaneous Q4H  . mouth rinse  15 mL Mouth Rinse 10 times per day  . Melatonin  6 mg Oral QHS  . multivitamin  15 mL Oral Daily  . pantoprazole sodium  40 mg Per Tube Daily  . polyethylene glycol  17 g Oral Daily  . potassium chloride  20 mEq Oral Once  . rosuvastatin  10 mg Oral Daily  . sertraline  25 mg Oral Daily  . sodium chloride flush  10-40 mL Intracatheter Q12H  . tamsulosin  0.4 mg Oral Daily  . traZODone  50 mg Oral QHS  . vitamin B-12  500 mcg Oral QODAY  . zolpidem  5 mg Oral QHS    have reviewed scheduled and prn medications.  Physical Exam: General: Intubated, alert Heart:RRR, s1s2 nl, no rubs Lungs: Coarse breath sound bilateral, no wheezing Abdomen:soft, Non-tender, non-distended Extremities: Has dependent edema Dialysis Access: None  Zohar Maroney Tanna Furry 06/19/2018,9:22 AM  LOS: 6 days

## 2018-06-19 NOTE — Progress Notes (Signed)
Dillon Andrews for Heparin -apixaban on hold Indication: atrial fibrillation  No Known Allergies  Patient Measurements: Height: 5' 10"  (177.8 cm) Weight: 192 lb 7.4 oz (87.3 kg) IBW/kg (Calculated) : 73   Vital Signs: Temp: 98 F (36.7 C) (03/06 1300) Temp Source: Oral (03/06 1300) BP: 96/66 (03/06 1115) Pulse Rate: 89 (03/06 1119)  Labs: Recent Labs    06/17/18 0026  06/17/18 0440 06/17/18 0913 06/18/18 0221 06/18/18 0645 06/18/18 2319 06/19/18 0344 06/19/18 0628  HGB  --   --  10.7*  --  11.0*  --  11.2* 11.3* 11.3*  HCT  --   --  31.4*  --  32.5*  --  32.6* 32.3* 32.3*  PLT  --   --  171  --  186  --   --  181  --   APTT  --   --   --  93* 65*  --   --  73*  --   LABPROT 26.7*  --   --   --   --   --  21.3*  --   --   INR 2.5*  --   --   --   --   --  1.9*  --   --   HEPARINUNFRC  --   --   --  >2.20* >2.20*  --   --  >2.20*  --   CREATININE  --    < > 5.02*  --  5.06* 5.11*  --  5.55*  --    < > = values in this interval not displayed.    Estimated Creatinine Clearance: 11.3 mL/min (A) (by C-G formula based on SCr of 5.55 mg/dL (H)).   Assessment: 77yom intubated 2/27 to protect airway after having runs VT needing to be shocked.  VT quiet on amiodaone drip - EP following   Has been on apixaban for anticoaguation in setting of Afib.  Now with AKI Cr 5 and in ICU may need IV lines placed - will hold apixaban and start heparin drip.  CBC continues to be stable.  Apixaban can fasley elevated the HL will use aptt for heparin dosing until washout / correlation.   Large hematoma on L arm looks improved per NP.  Heparin drip 900 uts/hr aptt 73sec HL remains elevated from apixaban interactio  - use PTT to dose heparin .   Goal of Therapy:  aptt 66-102 sec Heparin level 0.3-0.7 units/ml Monitor platelets by anticoagulation protocol: Yes   Plan:  Continue heparin infusion 900 units/hr Daily HL, APTT,CBC Monitor s/s bleeding and  renal function   Bonnita Nasuti Pharm.D. CPP, BCPS Clinical Pharmacist [phone] 06/19/2018 1:25 PM

## 2018-06-19 NOTE — Care Management Note (Signed)
Case Management Note  Patient Details  Name: JORGE RETZ MRN: [id number] Date of Birth: March 05, 1941  Subjective/Objective: 78 yo male presented with respiratory failure; patient has required intubation and CV.                Action/Plan: Patient lives at home and active with [facility] for home Milrinone infusion via CADD pump. Patient remains acutely ill, with a successful extubation today to O2. CM team will continue to follow for progression of care.   Expected Discharge Date:                  Expected Discharge Plan:  [place]  In-House Referral:  NA  Discharge planning Services  CM Consult  Post Acute Care Choice:  NA Choice offered to:  NA  DME Arranged:  N/A DME Agency:  NA  HH Arranged:  NA HH Agency:  NA  Status of Service:  In process, will continue to follow  If discussed at Long Length of Stay Meetings, dates discussed:    Additional Comments:  Midge Minium RN, BSN, NCM-BC, ACM-RN [phone] 06/19/2018, 4:17 PM

## 2018-06-19 NOTE — Progress Notes (Signed)
Still c/o nausea. OGT to LIS. Zofran given. Noted no bowel regime in epic. Ordered.

## 2018-06-20 ENCOUNTER — Inpatient Hospital Stay (HOSPITAL_COMMUNITY): Payer: Medicare HMO

## 2018-06-20 DIAGNOSIS — I5043 Acute on chronic combined systolic (congestive) and diastolic (congestive) heart failure: Secondary | ICD-10-CM

## 2018-06-20 LAB — CBC
HCT: 33 % — ABNORMAL LOW (ref 39.0–52.0)
Hemoglobin: 11.4 g/dL — ABNORMAL LOW (ref 13.0–17.0)
MCH: 31.8 pg (ref 26.0–34.0)
MCHC: 34.5 g/dL (ref 30.0–36.0)
MCV: 91.9 fL (ref 80.0–100.0)
Platelets: 178 10*3/uL (ref 150–400)
RBC: 3.59 MIL/uL — ABNORMAL LOW (ref 4.22–5.81)
RDW: 19.5 % — AB (ref 11.5–15.5)
WBC: 8 10*3/uL (ref 4.0–10.5)
nRBC: 0 % (ref 0.0–0.2)

## 2018-06-20 LAB — BASIC METABOLIC PANEL
Anion gap: 17 — ABNORMAL HIGH (ref 5–15)
BUN: 124 mg/dL — AB (ref 8–23)
CO2: 28 mmol/L (ref 22–32)
Calcium: 8.7 mg/dL — ABNORMAL LOW (ref 8.9–10.3)
Chloride: 88 mmol/L — ABNORMAL LOW (ref 98–111)
Creatinine, Ser: 6.04 mg/dL — ABNORMAL HIGH (ref 0.61–1.24)
GFR calc Af Amer: 9 mL/min — ABNORMAL LOW (ref >60)
GFR calc non Af Amer: 8 mL/min — ABNORMAL LOW (ref >60)
GLUCOSE: 130 mg/dL — AB (ref 70–99)
Potassium: 3.7 mmol/L (ref 3.5–5.1)
Sodium: 133 mmol/L — ABNORMAL LOW (ref 135–145)

## 2018-06-20 LAB — GLUCOSE, CAPILLARY
Glucose-Capillary: 100 mg/dL — ABNORMAL HIGH (ref 70–99)
Glucose-Capillary: 111 mg/dL — ABNORMAL HIGH (ref 70–99)
Glucose-Capillary: 115 mg/dL — ABNORMAL HIGH (ref 70–99)
Glucose-Capillary: 116 mg/dL — ABNORMAL HIGH (ref 70–99)
Glucose-Capillary: 123 mg/dL — ABNORMAL HIGH (ref 70–99)
Glucose-Capillary: 131 mg/dL — ABNORMAL HIGH (ref 70–99)

## 2018-06-20 LAB — COOXEMETRY PANEL
Carboxyhemoglobin: 1 % (ref 0.5–1.5)
Methemoglobin: 1.6 % — ABNORMAL HIGH (ref 0.0–1.5)
O2 Saturation: 60.5 %
Total hemoglobin: 12.5 g/dL (ref 12.0–16.0)

## 2018-06-20 LAB — APTT: APTT: 65 s — AB (ref 24–36)

## 2018-06-20 LAB — HEPARIN LEVEL (UNFRACTIONATED): Heparin Unfractionated: 2.02 IU/mL — ABNORMAL HIGH (ref 0.30–0.70)

## 2018-06-20 MED ORDER — POTASSIUM CHLORIDE CRYS ER 20 MEQ PO TBCR
20.0000 meq | EXTENDED_RELEASE_TABLET | Freq: Once | ORAL | Status: AC
  Administered 2018-06-20: 20 meq via ORAL
  Filled 2018-06-20: qty 1

## 2018-06-20 MED ORDER — FUROSEMIDE 10 MG/ML IJ SOLN
160.0000 mg | Freq: Two times a day (BID) | INTRAVENOUS | Status: DC
  Filled 2018-06-20: qty 16

## 2018-06-20 MED ORDER — PANTOPRAZOLE SODIUM 40 MG PO TBEC
40.0000 mg | DELAYED_RELEASE_TABLET | Freq: Every day | ORAL | Status: DC
  Administered 2018-06-20 – 2018-06-22 (×3): 40 mg via ORAL
  Filled 2018-06-20 (×3): qty 1

## 2018-06-20 MED ORDER — CHLORHEXIDINE GLUCONATE 0.12 % MT SOLN
OROMUCOSAL | Status: AC
  Administered 2018-06-20: 15 mL
  Filled 2018-06-20: qty 15

## 2018-06-20 MED ORDER — ADULT MULTIVITAMIN W/MINERALS CH
1.0000 | ORAL_TABLET | Freq: Every day | ORAL | Status: DC
  Administered 2018-06-20 – 2018-06-22 (×3): 1 via ORAL
  Filled 2018-06-20 (×3): qty 1

## 2018-06-20 MED ORDER — FUROSEMIDE 10 MG/ML IJ SOLN
160.0000 mg | Freq: Once | INTRAVENOUS | Status: AC
  Administered 2018-06-20: 160 mg via INTRAVENOUS
  Filled 2018-06-20: qty 16

## 2018-06-20 NOTE — Progress Notes (Signed)
Patient ID: Dillon Andrews, male   DOB: Jun 02, 1940, 77 y.o.   MRN: [id number]    Advanced Heart Failure Rounding Note   Subjective:    He was extubated yesterday, awake and alert.  Not short of breath at rest.   A-V sequential pacing.   Creatinine up to 6.04 this morning with rising BUN.  UOP 805 cc yesterday.  CVP 15, co-ox 61%.   He remains on norepinephrine 6, amiodarone 30, heparin gtt.   Objective:   Weight Range:  Vital Signs:   Temp:  [97.9 F (36.6 C)-98.5 F (36.9 C)] 98.5 F (36.9 C) (03/07 0354) Pulse Rate:  [85-91] 88 (03/07 0630) Resp:  [13-30] 28 (03/07 0630) BP: (78-105)/(58-74) 95/69 (03/07 0600) SpO2:  [90 %-97 %] 91 % (03/07 0630) FiO2 (%):  [40 %] 40 % (03/06 1015) Weight:  [85.9 kg] 85.9 kg (03/07 0430) Last BM Date: 05/31/2018  Weight change: Filed Weights   06/18/18 0229 06/19/18 0400 06/20/18 0430  Weight: 88.2 kg 87.3 kg 85.9 kg    Intake/Output:   Intake/Output Summary (Last 24 hours) at 06/20/2018 0703 Last data filed at 06/20/2018 0600 Gross per 24 hour  Intake 1393.59 ml  Output 805 ml  Net 588.59 ml    Physical Exam: CVP 14-15 General: NAD Neck: JVP 14-16, no thyromegaly or thyroid nodule.  Lungs: Dependent crackles CV: Lateral PMI.  Heart regular S1/S2, no S3/S4,2/6 SEM RUSB.  No peripheral edema.  N Abdomen: Soft, nontender, no hepatosplenomegaly, no distention.  Skin: Intact without lesions or rashes.  Neurologic: Alert and oriented x 3.  Psych: Normal affect. Extremities: No clubbing or cyanosis.  HEENT: Normal.   Telemetry: A-V sequential pacing at 90 Personally reviewed    Labs: Basic Metabolic Panel: Recent Labs  Lab 06/13/18 1634 06/14/18 0325 06/14/18 1709  06/16/18 0401  06/17/18 0440 06/18/18 0221 06/18/18 0645 06/19/18 0344 06/20/18 0415  NA  --  133*  --    < > 132*   < > 132* 130* 132* 131* 133*  K  --  4.1  --    < > 3.7   < > 3.3* 3.2* 3.7 3.5 3.7  CL  --  96*  --    < > 90*  --  88* 86* 87* 88* 88*   CO2  --  23  --    < > 26  --  30 31 30 29 28   GLUCOSE  --  172*  --    < > 209*  --  209* 166* 164* 143* 130*  BUN  --  48*  --    < > 81*  --  96* 110* 111* 120* 124*  CREATININE  --  3.37*  --    < > 4.41*  --  5.02* 5.06* 5.11* 5.55* 6.04*  CALCIUM  --  8.4*  --    < > 8.3*  --  8.1* 8.0* 8.2* 8.4* 8.7*  MG 2.4 2.4 2.4  --  2.2  --   --  2.3  --   --   --   PHOS 5.3* 5.1* 4.3  --  3.3  --   --   --   --   --   --    < > = values in this interval not displayed.    Liver Function Tests: Recent Labs  Lab 06/17/18 0913 06/18/18 0221  AST 49* 45*  ALT 14 13  ALKPHOS 159* 189*  BILITOT 0.9 0.9  PROT 5.3*  5.6*  ALBUMIN 2.1* 2.1*   No results for input(s): LIPASE, AMYLASE in the last 168 hours. No results for input(s): AMMONIA in the last 168 hours.  CBC: Recent Labs  Lab 06/16/18 2324 06/17/18 0440 06/18/18 0221 06/18/18 2319 06/19/18 0344 06/19/18 0628 06/20/18 0415  WBC 10.2 9.4 9.0  --  8.9  --  8.0  HGB 11.0* 10.7* 11.0* 11.2* 11.3* 11.3* 11.4*  HCT 32.0* 31.4* 32.5* 32.6* 32.3* 32.3* 33.0*  MCV 92.8 93.5 91.5  --  91.0  --  91.9  PLT 184 171 186  --  181  --  178    Cardiac Enzymes: No results for input(s): CKTOTAL, CKMB, CKMBINDEX, TROPONINI in the last 168 hours.  BNP: BNP (last 3 results) Recent Labs    11/05/17 1310 12/02/17 1846 01/05/18 1431  BNP 515.3* 349.6* 516.7*    ProBNP (last 3 results) No results for input(s): PROBNP in the last 8760 hours.    Other results:  Imaging: No results found.   Medications:     Scheduled Medications: . chlorhexidine gluconate (MEDLINE KIT)  15 mL Mouth Rinse BID  . Chlorhexidine Gluconate Cloth  6 each Topical Q0600  . feeding supplement (PRO-STAT SUGAR FREE 64)  30 mL Per Tube TID  . feeding supplement (VITAL 1.5 CAL)  1,000 mL Per Tube Q24H  . gabapentin  400 mg Oral QHS  . insulin aspart  0-9 Units Subcutaneous Q4H  . mouth rinse  15 mL Mouth Rinse BID  . Melatonin  6 mg Oral QHS  .  multivitamin  15 mL Oral Daily  . pantoprazole sodium  40 mg Per Tube Daily  . polyethylene glycol  17 g Oral Daily  . potassium chloride  20 mEq Oral Once  . rosuvastatin  10 mg Oral Daily  . sertraline  25 mg Oral Daily  . sodium chloride flush  10-40 mL Intracatheter Q12H  . tamsulosin  0.4 mg Oral Daily  . traZODone  50 mg Oral QHS  . vitamin B-12  500 mcg Oral QODAY  . zolpidem  5 mg Oral QHS    Infusions: . sodium chloride 5 mL/hr at 06/20/18 0600  . amiodarone 30 mg/hr (06/20/18 0600)  . cefTRIAXone (ROCEPHIN)  IV Stopped (06/20/18 0516)  . furosemide    . heparin Stopped (06/20/18 0408)  . norepinephrine (LEVOPHED) Adult infusion 6 mcg/min (06/20/18 0600)    PRN Medications: acetaminophen, alum & mag hydroxide-simeth, hydrocortisone, ondansetron (ZOFRAN) IV, promethazine, sodium chloride flush, traMADol   Assessment:   Dillon Nevills Knightis a 78 y.o.malewith a hx of chronic combined CHF due to NICM with probable TTR amyloid, CKD III,presumedNASH(per pt, never proven),esophageal varices,  RBBB, PAF requiring multiple DCCV and now s/p atrial flutter ablation 05/22/18. He is on home milrinone 0.125 followed by [facility].   He was admitted for RV lead revision. Admission complicated by recurrent HF in setting of AF with RVR. On 2/28 decompensated with multiple episodes of VT complicated by respiratory failure leading to intubation and DC-CV   Plan/Discussion:    1. Acute on chronic systolic HF -> cardiogenic shock -NICM by cath at [facility] 09/2016 -Echo 06/2017: EF 25-30%, mild LAE, mild/mod RV dilation, mod/severe RA. Echo 10/2017: EF 10% -Echo12/19 EF 35-40%. He has been on home milrinone -PYP scan strongly suggestive of TTR amyloid, SPEP with 0.8% m-spike, UPEP with Bence Jones Protein positive, kappa type. 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 Dr Marin Olp saw and thinks he does not have AL amyloid (  just multiple myeloma) and that he has TTR amyloid and should be  treated with tafamidis before chemo. Tafamidis started on 11/14/17.  - Decompensated in setting of recurrent AF and VT. Now A-V sequentially paced.  - NE down to 6 mcg. Off milrinone.  CO-OX is 61% - Course c/b ATN and cardiorenal syndrome - He is volume overloaded by exam and CVP 15 today. Creatinine rising, up to 6.04 today.  - I will give him Lasix 160 mg IV x 1 today and follow response, only with 800 cc UOP yesterday.  - No ARB/entresto/spirowith for now with CKD - Has been off hydralazine/imdur. No b-blocker with low output  2.Paroxysmal  AtrialFlutter/Fib - s/p DCCV of 7/31, 8/2, 12/19/2017  -  s/p aflutter ablation 2/7 -  had recurrent AF on admit. S/p emergent DC-CV 2/28 - On 2/29 in junctional rhythm. Pacer now set DD at 34 to force AV pacing. -  Continue IV amiodarone to keep him out of AF.  Failedpo amiodarone previously with recurrent nausea. But does not tolerate AF well and will need AA drug vs AV node ablation.   - On heparin drip.   3. VT - improved with IV lidocaine on 2/27 - now quiescent on IV amio - Keep K> 4.0 mg > 2.0 - Supp K  4. Acute hypoxic respiratory failure - Now extubated and stable.    5. h/o junctional rhythm with symptomatic bradycardia -> MDT PPM.  -RV lead revised this admission. Currently stable - EP following - Currently A-V sequential pacing.   6. Acute on chronic renal failure - likely due to ATN and cardiorenal syndrome - baseline creatinine 1.6-1.8 - Creatinine continues to rise-->3.9-->4.4->5.0->5.06 -> 6.04.  - Volume status elevated. Renal function seemed to plateau at 5.0 yesterday. Await BMET today - He would be ok with short-term CRRT but would be a candidate for (or want) long-term HD.  BUN/creatinine are worsening.  Though there is no emergent indication for CVVH, I think that he is heading in that direction.  I am concerned that if we start CVVH, he will not have recovery.  As above, long-term HD would not be a good option.   Will give Lasix 160 mg IV x 1 this morning and follow (CVP high, not short of breath).  -Nephrology appreciated.   7. 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 Marin Olp . I spoke to him today regarding his admit.  -Started on Bortezomib7/18/18andRevlimid7/23/19. - Dr Marin Olp thinks he does not have AL amyloid and that he does have TTR amyloid and should be treated with tafamidis before chemo. Continue Tafamidis.  - Off tafamidis when he was intubated.    8. LUE Hematoma  Wrap removed. LUE stable. Resolved. Hgb stable.   9. Hyponatremia  Sodium higher at 133.    10. Hypokalemia Supp K to keep > 4.0  11. Hematuria/ UTI - He is on ceftriaxone for UTI.    Length of Stay: 7  CRITICAL CARE Performed by: Loralie Champagne  Total critical care time: 35 minutes  Critical care time was exclusive of separately billable procedures and treating other patients.  Critical care was necessary to treat or prevent imminent or life-threatening deterioration.  Critical care was time spent personally by me (independent of midlevel providers or residents) on the following activities: development of treatment plan with patient and/or surrogate as well as nursing, discussions with consultants, evaluation of patient's response to treatment, examination of patient, obtaining history from patient or surrogate,  ordering and performing treatments and interventions, ordering and review of laboratory studies, ordering and review of radiographic studies, pulse oximetry and re-evaluation of patient's condition.    Loralie Champagne, MD  7:03 AM   Advanced Heart Failure Team Pager [phone] (M-F; 7a - 4p)  Please contact [facility] for night-coverage after hours (4p -7a ) and weekends on [email or web address]

## 2018-06-20 NOTE — Progress Notes (Signed)
25 ml of versed wasted in stericycle. Witnessed by Herbie Baltimore, RN

## 2018-06-20 NOTE — Progress Notes (Signed)
Left radial arterial site started bleeding again. Pressure applied for 40 mins until hemostasis achieved. Level 1 with bruised around site, no hematoma palpated. Heparin drip was held for about 1hr during pressure applied to site and 64ms after per cardiology fellow. Will continue to monitor closely.

## 2018-06-20 NOTE — Progress Notes (Signed)
[facility] NEPHROLOGY PROGRESS NOTE  Assessment/ Plan: Pt is a 78 y.o. yo male with hypertension, type 2 diabetes, K-LC myeloma, NASH, esophageal varices, A. fib, CHF on home milrinone, CKD stage III with creatinine 1.4-1.7 at baseline admitted for decompensated heart failure and respiratory failure.  #Acute kidney injury on CKD stage III, nonoliguric due to cardiorenal syndrome and acute worsening creatinine due to ischemic ATN related with  diuretics -The serum creatinine level worsened to 6.04, urine output cc in 24 hours.  Electrolytes acceptable.  I agree with trying a dose of Lasix today to see the response.  If no improvement, he will need CRRT to optimize the volume status.  I have discussed with the patient yesterday afternoon and today.  He agreed with short-term CRRT if needed.  I have discussed with him that the  CRRT may put him to permanent dialysis which he may not tolerated in long-term. There is no urgent indication for starting dialysis today.  Continue to monitor closely. I have discussed with Dr. Aundra Dubin - Avoid IV contrast or hypotensive episode.  #Hyponatremia, hypervolemic: Serum sodium level stable.  #Cardiogenic shock: Blood pressure low today, on Levophed with titration.  Heart failure team is following.  #Atrial fibrillation/flutter on status post DCCV, on amiodarone and heparin.  #Acute respiratory failure with hypoxia currently intubated.  Per PCCM, patient is extubated.  #Kappa Light chain myeloma and concern for cardiac amyloid: Status post treatment, follows with oncology.   Subjective: Seen and examined ICU.  He is extubated, currently on nasal cannula.  Mental status is good and understand the current situation.  Family member at bedside.  Denies chest pain or shortness of breath.  Objective Vital signs in last 24 hours: Vitals:   06/20/18 0700 06/20/18 0730 06/20/18 0800 06/20/18 0830  BP:  97/70 94/67 92/67   Pulse: 90 89 90 90  Resp:  (!) 23 20 (!) 27 (!) 25  Temp: 98.6 F (37 C)     TempSrc: Oral     SpO2: 92% 92% 91% 91%  Weight:      Height:       Weight change: -1.4 kg  Intake/Output Summary (Last 24 hours) at 06/20/2018 0850 Last data filed at 06/20/2018 0800 Gross per 24 hour  Intake 1374.45 ml  Output 945 ml  Net 429.45 ml       Labs: Basic Metabolic Panel: Recent Labs  Lab 06/14/18 0325 06/14/18 1709  06/16/18 0401  06/18/18 0645 06/19/18 0344 06/20/18 0415  NA 133*  --    < > 132*   < > 132* 131* 133*  K 4.1  --    < > 3.7   < > 3.7 3.5 3.7  CL 96*  --    < > 90*   < > 87* 88* 88*  CO2 23  --    < > 26   < > 30 29 28   GLUCOSE 172*  --    < > 209*   < > 164* 143* 130*  BUN 48*  --    < > 81*   < > 111* 120* 124*  CREATININE 3.37*  --    < > 4.41*   < > 5.11* 5.55* 6.04*  CALCIUM 8.4*  --    < > 8.3*   < > 8.2* 8.4* 8.7*  PHOS 5.1* 4.3  --  3.3  --   --   --   --    < > = values in this interval  not displayed.   Liver Function Tests: Recent Labs  Lab 06/17/18 0913 06/18/18 0221  AST 49* 45*  ALT 14 13  ALKPHOS 159* 189*  BILITOT 0.9 0.9  PROT 5.3* 5.6*  ALBUMIN 2.1* 2.1*   No results for input(s): LIPASE, AMYLASE in the last 168 hours. No results for input(s): AMMONIA in the last 168 hours. CBC: Recent Labs  Lab 06/16/18 2324 06/17/18 0440 06/18/18 0221  06/19/18 0344 06/19/18 0628 06/20/18 0415  WBC 10.2 9.4 9.0  --  8.9  --  8.0  HGB 11.0* 10.7* 11.0*   < > 11.3* 11.3* 11.4*  HCT 32.0* 31.4* 32.5*   < > 32.3* 32.3* 33.0*  MCV 92.8 93.5 91.5  --  91.0  --  91.9  PLT 184 171 186  --  181  --  178   < > = values in this interval not displayed.   Cardiac Enzymes: No results for input(s): CKTOTAL, CKMB, CKMBINDEX, TROPONINI in the last 168 hours. CBG: Recent Labs  Lab 06/19/18 1542 06/19/18 1946 06/19/18 2356 06/20/18 0353 06/20/18 0741  GLUCAP 122* 129* 116* 123* 131*    Iron Studies: No results for input(s): IRON, TIBC, TRANSFERRIN, FERRITIN in the last 72  hours. Studies/Results: No results found.  Medications: Infusions: . sodium chloride Stopped (06/20/18 0728)  . amiodarone 30 mg/hr (06/20/18 0800)  . cefTRIAXone (ROCEPHIN)  IV Stopped (06/20/18 0516)  . heparin 900 Units/hr (06/20/18 0800)  . norepinephrine (LEVOPHED) Adult infusion 5 mcg/min (06/20/18 0800)    Scheduled Medications: . chlorhexidine gluconate (MEDLINE KIT)  15 mL Mouth Rinse BID  . Chlorhexidine Gluconate Cloth  6 each Topical Q0600  . gabapentin  400 mg Oral QHS  . insulin aspart  0-9 Units Subcutaneous Q4H  . mouth rinse  15 mL Mouth Rinse BID  . Melatonin  6 mg Oral QHS  . multivitamin  15 mL Oral Daily  . pantoprazole sodium  40 mg Per Tube Daily  . polyethylene glycol  17 g Oral Daily  . potassium chloride  20 mEq Oral Once  . rosuvastatin  10 mg Oral Daily  . sertraline  25 mg Oral Daily  . sodium chloride flush  10-40 mL Intracatheter Q12H  . tamsulosin  0.4 mg Oral Daily  . traZODone  50 mg Oral QHS  . vitamin B-12  500 mcg Oral QODAY  . zolpidem  5 mg Oral QHS    have reviewed scheduled and prn medications.  Physical Exam: General: Extubated, alert awake Heart:RRR, s1s2 nl, no rubs Lungs: Clear bilateral, no wheezing or crackle appreciated Abdomen:soft, Non-tender, non-distended Extremities: Has dependent edema Dialysis Access: None Neurology: Alert awake, no asterixis  Skya Mccullum Prasad Ender Rorke 06/20/2018,8:50 AM  LOS: 7 days

## 2018-06-20 NOTE — Progress Notes (Signed)
[place] for Heparin -apixaban on hold Indication: atrial fibrillation  No Known Allergies  Patient Measurements: Height: 5' 10"  (177.8 cm) Weight: 189 lb 6 oz (85.9 kg) IBW/kg (Calculated) : 73   Vital Signs: Temp: 98.6 F (37 C) (03/07 0700) Temp Source: Oral (03/07 0700) BP: 93/68 (03/07 1100) Pulse Rate: 110 (03/07 1100)  Labs: Recent Labs    06/18/18 0221 06/18/18 0645 06/18/18 2319 06/19/18 0344 06/19/18 0628 06/20/18 0415  HGB 11.0*  --  11.2* 11.3* 11.3* 11.4*  HCT 32.5*  --  32.6* 32.3* 32.3* 33.0*  PLT 186  --   --  181  --  178  APTT 65*  --   --  73*  --  65*  LABPROT  --   --  21.3*  --   --   --   INR  --   --  1.9*  --   --   --   HEPARINUNFRC >2.20*  --   --  >2.20*  --  2.02*  CREATININE 5.06* 5.11*  --  5.55*  --  6.04*    Estimated Creatinine Clearance: 10.4 mL/min (A) (by C-G formula based on SCr of 6.04 mg/dL (H)).   Assessment: 77yom intubated 2/27 to protect airway after having runs VT needing to be shocked.  VT quiet on amiodaone drip - EP following   Has been on apixaban for anticoaguation in setting of Afib.  Now with AKI Cr 5 and in ICU may need IV lines placed - will hold apixaban and start heparin drip.  CBC continues to be stable.  Apixaban can fasley elevated the HL will use aptt for heparin dosing until washout / correlation.   Large hematoma on L arm has some bleeding overnight - heparin held but now stable and heparin drip restarted.    Heparin drip 900 uts/hr aptt 65sec HL remains elevated from apixaban interactio  - use PTT to dose heparin .   Goal of Therapy:  aptt 66-102 sec Heparin level 0.3-0.7 units/ml Monitor platelets by anticoagulation protocol: Yes   Plan:  Continue heparin infusion 900 units/hr Daily HL, APTT,CBC Monitor s/s bleeding and renal function   Bonnita Nasuti Pharm.D. CPP, BCPS Clinical Pharmacist [phone] 06/20/2018 11:14 AM

## 2018-06-20 NOTE — Plan of Care (Signed)
  Problem: Respiratory: Goal: Ability to maintain a clear airway and adequate ventilation will improve Outcome: Progressing   Problem: Role Relationship: Goal: Method of communication will improve Outcome: Progressing   Problem: Cardiac: Goal: Ability to achieve and maintain adequate cardiopulmonary perfusion will improve Outcome: Progressing

## 2018-06-20 NOTE — Progress Notes (Signed)
NAME:  RAYLAN HANTON, MRN:  [id number], DOB:  03/02/1941, LOS: 7 ADMISSION DATE:  05/25/2018, CONSULTATION DATE:  06/12/2018 REFERRING MD:  Sela Hua, MD, CHIEF COMPLAINT:  Respiratory failure  Brief History   Mr. Viegas is a 78 YO gentleman with history significant for Chronic systolic HF on home milrinone due to TTR amyloidosis, atrial flutter, pace maker placement due to complete heart block with recent RV lead revision, chronic anticoagulation, NASH with esophageal varices  and CKD who is transferred to the CCU from the floor after he developed multiple episodes of rapid VT with hemodynamic instability requiring intubation and cardioversion  Past Medical History  AFlutter: H/o ablation, known to have multiple circuits, NASH W/esophageal varicies NICM, Chronic CHF On home milrinone, DM, HTN, CKD (III)  Significant Hospital Events   -Endotracheal intubation 2/28 -Right IJ central line 2/28 -Left radial A-line 2/28 -DC cardioversion 2/28 3/3 still on pressors and lasix gtt. Starting PSV trial 3/4: Lasix and additional diuretics all discontinued due to rising creatinine up to 5.  Still requiring norepinephrine however weaning this down slowly.  Developed increased accessory use when pressure support limited to 5.  Chest x-ray unchanged with bilateral pleural effusions and pulmonary edema.  Nephrology approaching family about possible dialysis 3/5: Reattempting diuresis.  Chest x-ray unchanged.  Renal function seems to have hit a plateau.  Trying to avoid dialysis 3/6: Down 2 pounds, but creatinine now up to 5.5 from 5.  He has passed his spontaneous breathing trial, extubated   Consults:  -PCCM  Procedures:  As above  Significant Diagnostic Tests:  05/22/2018: EPS/ablation:  1. Right atrial flutter upon presentation. 2. Successful radiofrequency ablation of atrial flutter along the cavotricuspid isthmus with complete bidirectional isthmus block achieved. 3. No early apparent  complications.   Echo 12.31/2019 - Left ventricle: The cavity size was normal. Wall thickness was   increased in a pattern of severe LVH. Systolic function was   moderately reduced. The estimated ejection fraction was in the   range of 35% to 40%. Diffuse hypokinesis. Doppler parameters are   consistent with a reversible restrictive pattern, indicative of   decreased left ventricular diastolic compliance and/or increased   left atrial pressure (grade 3 diastolic dysfunction). - Ventricular septum: The contour showed diastolic flattening. - Aortic valve: Trileaflet; mildly thickened, mildly calcified   leaflets. - Mitral valve: There was mild regurgitation. - Left atrium: The atrium was moderately dilated. - Right ventricle: The cavity size was moderately dilated. Wall   thickness was normal. - Right atrium: The atrium was moderately dilated. - Pulmonary arteries: Systolic pressure was moderately to severely   increased. PA peak pressure: 68 mm Hg (S).    11/10/2017: TEE/DCCV - Left ventricle: The estimated ejection fraction was 10%. Diffusehypokinesis. There was spontaneous echo contrast, indicative ofstasis. No evidence of thrombus. - Aortic valve: No evidence of vegetation. - Mitral valve: No evidence of vegetation. There was mild regurgitation. - Left atrium: The atrium was moderately dilated. No evidence of thrombus in the atrial cavity or appendage. No evidence of thrombus in the atrial cavity or appendage. - Right ventricle: Systolic function was severely reduced. - Right atrium: The atrium was dilated. - Atrial septum: No defect or patent foramen ovale was identified. - Tricuspid valve: No evidence of vegetation. There was moderate regurgitation. - Pulmonic valve: No evidence of vegetation. Impressions: - Successful cardioversion. No cardiac source of emboli was indentified.  Micro Data:  -None   Antimicrobials:  -None   Subjective  Extubated  3/6. Has done well , very alert and talkative this am. Says feels better since tube out.  Nausea this am, small amt of emesis -zofran/phenergan given .  O2 sats 90% on 4l/m  Weaning pressors-doing well , should be off soon.   Objective   Blood pressure 92/67, pulse 90, temperature 98.6 F (37 C), temperature source Oral, resp. rate (!) 25, height 5' 10"  (1.778 m), weight 85.9 kg, SpO2 91 %. CVP:  [10 mmHg-15 mmHg] 15 mmHg  FiO2 (%):  [40 %] 40 %   Intake/Output Summary (Last 24 hours) at 06/20/2018 0902 Last data filed at 06/20/2018 0800 Gross per 24 hour  Intake 1333.93 ml  Output 860 ml  Net 473.93 ml   Filed Weights   06/18/18 0229 06/19/18 0400 06/20/18 0430  Weight: 88.2 kg 87.3 kg 85.9 kg    Examination:  General: Pleasant 78 year old male sitting up in bed alert and oriented HEENT normocephalic, atraumatic  Pulmonary: Decreased breath sounds in the bases.  No wheezing.   Cardiac: RRR, no MRG Abdomen: Soft, positive bowel sounds, nontender  Extremities: Warm and dry, trace edema  Neuro: Alert and oriented following commands GU: Foley concentrated yellow urine  Assessment & Plan:    Acute hypoxic respiratory failure d/t acute pulmonary edema and pleural effusions- Extubated 3/6   Plan Oxygen to keep sats >90%.  IS  Check CXR in am  Cardiogenic Shock in setting of acute on chronic systolic and diastolic HF H/o TTR amyloid Plan Titrating norepinephrine -MAP goal >65, hopefully will be off soon. (avoid hypotension)  Holding antihypertensives  Not currently on his tafamidis  Diuresis per renal /cards   Multiple arrhythmias: VT/ fib/flutter Plan Continuing IV heparin Amiodarone per primary  Acute on chronic Kidney disease, with associated electrolyte imbalance Serum creatinine continues to tr up .  Plan Keep euvolemic Renal dose medications Avoid hypotension Deferring decision about CRRT to nephrology and primary service.   Lasix 185m x 1 today per  nephrology   Hyperglycemia Plan Sliding scale insulin  Anemia w/out evidence of bleeding Plan Trending CBC  Possible urinary tract infection Plan Day #2 of 7 Rocephin per primary team  Hx of NASH  Constipation Nausea   Plan  D/c Tylenol  Cont miralax  Check KUB    Best practice:  Diet: clear liquid  Pain/Anxiety/Delirium protocol (if indicated): Fentanyl, Midazolam: Discontinued 3/6 VAP protocol (if indicated): HoB elevated, PPI, Oral care /extubated 3/6  DVT prophylaxis: Eliquis>, placed on heparin gtt  GI prophylaxis: Pantoprazole  Glucose control: Maintain euglycemia  Mobility: PT and OT to evaluate and treat as appropriate  Code Status: Full code  Family Communication: Family updated  Disposition: ICU     NP-C  [facility]  [phone]  06/20/2018

## 2018-06-21 ENCOUNTER — Inpatient Hospital Stay (HOSPITAL_COMMUNITY): Payer: Medicare HMO

## 2018-06-21 LAB — COOXEMETRY PANEL
Carboxyhemoglobin: 0.9 % (ref 0.5–1.5)
Methemoglobin: 1.5 % (ref 0.0–1.5)
O2 Saturation: 57 %
Total hemoglobin: 12.7 g/dL (ref 12.0–16.0)

## 2018-06-21 LAB — BASIC METABOLIC PANEL
Anion gap: 21 — ABNORMAL HIGH (ref 5–15)
BUN: 135 mg/dL — ABNORMAL HIGH (ref 8–23)
CO2: 26 mmol/L (ref 22–32)
Calcium: 8.4 mg/dL — ABNORMAL LOW (ref 8.9–10.3)
Chloride: 85 mmol/L — ABNORMAL LOW (ref 98–111)
Creatinine, Ser: 6.36 mg/dL — ABNORMAL HIGH (ref 0.61–1.24)
GFR calc Af Amer: 9 mL/min — ABNORMAL LOW (ref >60)
GFR calc non Af Amer: 8 mL/min — ABNORMAL LOW (ref >60)
Glucose, Bld: 122 mg/dL — ABNORMAL HIGH (ref 70–99)
POTASSIUM: 4.6 mmol/L (ref 3.5–5.1)
Sodium: 132 mmol/L — ABNORMAL LOW (ref 135–145)

## 2018-06-21 LAB — GLUCOSE, CAPILLARY
GLUCOSE-CAPILLARY: 152 mg/dL — AB (ref 70–99)
GLUCOSE-CAPILLARY: 141 mg/dL — AB (ref 70–99)
Glucose-Capillary: 122 mg/dL — ABNORMAL HIGH (ref 70–99)
Glucose-Capillary: 123 mg/dL — ABNORMAL HIGH (ref 70–99)
Glucose-Capillary: 168 mg/dL — ABNORMAL HIGH (ref 70–99)

## 2018-06-21 LAB — CBC
HCT: 32 % — ABNORMAL LOW (ref 39.0–52.0)
Hemoglobin: 11.1 g/dL — ABNORMAL LOW (ref 13.0–17.0)
MCH: 31.6 pg (ref 26.0–34.0)
MCHC: 34.7 g/dL (ref 30.0–36.0)
MCV: 91.2 fL (ref 80.0–100.0)
NRBC: 0 % (ref 0.0–0.2)
Platelets: 173 10*3/uL (ref 150–400)
RBC: 3.51 MIL/uL — ABNORMAL LOW (ref 4.22–5.81)
RDW: 19.5 % — ABNORMAL HIGH (ref 11.5–15.5)
WBC: 7.7 10*3/uL (ref 4.0–10.5)

## 2018-06-21 LAB — APTT: APTT: 98 s — AB (ref 24–36)

## 2018-06-21 LAB — HEPARIN LEVEL (UNFRACTIONATED): Heparin Unfractionated: 1.74 IU/mL — ABNORMAL HIGH (ref 0.30–0.70)

## 2018-06-21 MED ORDER — OXYCODONE-ACETAMINOPHEN 7.5-325 MG PO TABS
1.0000 | ORAL_TABLET | Freq: Once | ORAL | Status: AC
  Administered 2018-06-21: 1 via ORAL
  Filled 2018-06-21: qty 1

## 2018-06-21 MED ORDER — FUROSEMIDE 10 MG/ML IJ SOLN
160.0000 mg | Freq: Three times a day (TID) | INTRAVENOUS | Status: DC
  Administered 2018-06-21 – 2018-06-22 (×4): 160 mg via INTRAVENOUS
  Filled 2018-06-21 (×2): qty 16
  Filled 2018-06-21: qty 4
  Filled 2018-06-21: qty 10
  Filled 2018-06-21: qty 4

## 2018-06-21 MED ORDER — NEPRO/CARBSTEADY PO LIQD
237.0000 mL | Freq: Two times a day (BID) | ORAL | Status: DC
  Administered 2018-06-21: 237 mL via ORAL
  Filled 2018-06-21 (×3): qty 237

## 2018-06-21 NOTE — Progress Notes (Signed)
NAME:  Dillon Andrews, MRN:  [id number], DOB:  11/17/1940, LOS: 108 ADMISSION DATE:  06/01/2018, CONSULTATION DATE:  06/12/2018 REFERRING MD:  Sela Hua, MD, CHIEF COMPLAINT:  Respiratory failure  Brief History   Dillon Andrews is a 78 YO gentleman with history significant for Chronic systolic HF on home milrinone due to TTR amyloidosis, atrial flutter, pace maker placement due to complete heart block with recent RV lead revision, chronic anticoagulation, NASH with esophageal varices  and CKD who is transferred to the CCU from the floor after he developed multiple episodes of rapid VT with hemodynamic instability requiring intubation and cardioversion  Past Medical History  AFlutter: H/o ablation, known to have multiple circuits, NASH W/esophageal varicies NICM, Chronic CHF On home milrinone, DM, HTN, CKD (III)  Significant Hospital Events   -Endotracheal intubation 2/28 -Right IJ central line 2/28 -Left radial A-line 2/28 -DC cardioversion 2/28 3/3 still on pressors and lasix gtt. Starting PSV trial 3/4: Lasix and additional diuretics all discontinued due to rising creatinine up to 5.  Still requiring norepinephrine however weaning this down slowly.  Developed increased accessory use when pressure support limited to 5.  Chest x-ray unchanged with bilateral pleural effusions and pulmonary edema.  Nephrology approaching family about possible dialysis 3/5: Reattempting diuresis.  Chest x-ray unchanged.  Renal function seems to have hit a plateau.  Trying to avoid dialysis 3/6: Down 2 pounds, but creatinine now up to 5.5 from 5.  He has passed his spontaneous breathing trial, extubated   Consults:  -PCCM  Procedures:  As above  Significant Diagnostic Tests:  05/22/2018: EPS/ablation:  1. Right atrial flutter upon presentation. 2. Successful radiofrequency ablation of atrial flutter along the cavotricuspid isthmus with complete bidirectional isthmus block achieved. 3. No early apparent  complications.   Echo 12.31/2019 - Left ventricle: The cavity size was normal. Wall thickness was   increased in a pattern of severe LVH. Systolic function was   moderately reduced. The estimated ejection fraction was in the   range of 35% to 40%. Diffuse hypokinesis. Doppler parameters are   consistent with a reversible restrictive pattern, indicative of   decreased left ventricular diastolic compliance and/or increased   left atrial pressure (grade 3 diastolic dysfunction). - Ventricular septum: The contour showed diastolic flattening. - Aortic valve: Trileaflet; mildly thickened, mildly calcified   leaflets. - Mitral valve: There was mild regurgitation. - Left atrium: The atrium was moderately dilated. - Right ventricle: The cavity size was moderately dilated. Wall   thickness was normal. - Right atrium: The atrium was moderately dilated. - Pulmonary arteries: Systolic pressure was moderately to severely   increased. PA peak pressure: 68 mm Hg (S).    11/10/2017: TEE/DCCV - Left ventricle: The estimated ejection fraction was 10%. Diffusehypokinesis. There was spontaneous echo contrast, indicative ofstasis. No evidence of thrombus. - Aortic valve: No evidence of vegetation. - Mitral valve: No evidence of vegetation. There was mild regurgitation. - Left atrium: The atrium was moderately dilated. No evidence of thrombus in the atrial cavity or appendage. No evidence of thrombus in the atrial cavity or appendage. - Right ventricle: Systolic function was severely reduced. - Right atrium: The atrium was dilated. - Atrial septum: No defect or patent foramen ovale was identified. - Tricuspid valve: No evidence of vegetation. There was moderate regurgitation. - Pulmonic valve: No evidence of vegetation. Impressions: - Successful cardioversion. No cardiac source of emboli was indentified.  Micro Data:  -None   Antimicrobials:  -None   Subjective  Weaned off  pressors 3/7 , b/p doing well .  Good BM with miralax , no further n/v.  Eating small amt.  Increased o2 demands , no increased wob.  Increased UOP with lasix , scr tr slightly up at 6.3.  Remains very alert and oriented.   Objective   Blood pressure 94/67, pulse 90, temperature (!) 97.5 F (36.4 C), temperature source Oral, resp. rate 20, height 5' 10"  (1.778 m), weight 86.5 kg, SpO2 90 %. CVP:  [11 mmHg-17 mmHg] 15 mmHg  FiO2 (%):  [55 %] 55 %   Intake/Output Summary (Last 24 hours) at 06/21/2018 1006 Last data filed at 06/21/2018 1000 Gross per 24 hour  Intake 1711.8 ml  Output 1110 ml  Net 601.8 ml   Filed Weights   06/19/18 0400 06/20/18 0430 06/21/18 0430  Weight: 87.3 kg 85.9 kg 86.5 kg    Examination:  General: Pleasant 78 year old male sitting up in bed alert and oriented HEENT normocephalic, atraumatic , dry mucosa  Pulmonary: Decreased breath sounds in the bases.  No wheezing.   Cardiac: RRR, no MRG Abdomen: Soft, positive bowel sounds, nontender  Extremities: Warm and dry, trace edema  Neuro: Alert and oriented following commands GU: Foley concentrated yellow urine, trace bld noted   Assessment & Plan:    Acute hypoxic respiratory failure d/t acute pulmonary edema and pleural effusions- Extubated 3/6   Plan Oxygen to keep sats >90%.  IS  Check CXR in am  Cardiogenic Shock in setting of acute on chronic systolic and diastolic HF H/o TTR amyloid Off pressors 3/7  Plan Holding antihypertensives  Not currently on his tafamidis  Diuresis per renal /cards   Multiple arrhythmias: VT/ fib/flutter Plan Continuing IV heparin Amiodarone per primary  Acute on chronic Kidney disease, with associated electrolyte imbalance Serum creatinine continues to tr up .  Plan Keep euvolemic Renal dose medications Avoid hypotension Deferring decision about CRRT to nephrology and primary service.   Lasix 110m q8 per nephrology  D/c aLorrin Andrews-not a good option for this  pt   Hyperglycemia Plan Sliding scale insulin  Anemia w/out evidence of bleeding Plan Trending CBC  Possible urinary tract infection Plan Day #3 of 7 Rocephin per primary team  Hx of NASH  Constipation-resolved, large BM 3/7  Nausea   Plan  Miralax As needed    Protein Calorie Malnutrition Plan  Add ensure/nepro supplement      Best practice:  Diet: clear liquid  Pain/Anxiety/Delirium protocol (if indicated): Fentanyl, Midazolam: Discontinued 3/6 VAP protocol (if indicated): HoB elevated, PPI, Oral care /extubated 3/6  DVT prophylaxis: Eliquis>, placed on heparin gtt  GI prophylaxis: Pantoprazole  Glucose control: Maintain euglycemia  Mobility: PT and OT to evaluate and treat as appropriate  Code Status: Full code  Family Communication: Family updated  Disposition: ICU   Tammy Parrett NP-C  [facility]  [phone]  06/21/2018

## 2018-06-21 NOTE — Plan of Care (Signed)
  Problem: Respiratory: Goal: Ability to maintain a clear airway and adequate ventilation will improve Outcome: Progressing   Problem: Role Relationship: Goal: Method of communication will improve Outcome: Progressing   Problem: Cardiac: Goal: Ability to achieve and maintain adequate cardiopulmonary perfusion will improve Outcome: Progressing

## 2018-06-21 NOTE — Progress Notes (Signed)
Patient ID: Dillon Andrews, male   DOB: Feb 01, 1941, 78 y.o.   MRN: [id number]   Advanced Heart Failure Rounding Note   Subjective:    He was extubated 3/6, awake and alert this morning.  Not short of breath at rest, feels tired.   A-V sequential pacing.   He is off norepinephrine.  Creatinine up to 6.36 this morning with rising BUN.  UOP better yesterday with 1 dose of Lasix 160 mg IV, 1265 cc.  CVP 17, co-ox 57%.   He remains on amiodarone 30, heparin gtt.   Objective:   Weight Range:  Vital Signs:   Temp:  [97.2 F (36.2 C)-98.4 F (36.9 C)] 97.7 F (36.5 C) (03/08 0300) Pulse Rate:  [87-113] 89 (03/08 0700) Resp:  [19-40] 24 (03/08 0700) BP: (86-112)/(57-92) 93/68 (03/08 0700) SpO2:  [87 %-95 %] 91 % (03/08 0700) FiO2 (%):  [55 %] 55 % (03/08 0500) Weight:  [86.5 kg] 86.5 kg (03/08 0430) Last BM Date: 06/21/18  Weight change: Filed Weights   06/19/18 0400 06/20/18 0430 06/21/18 0430  Weight: 87.3 kg 85.9 kg 86.5 kg    Intake/Output:   Intake/Output Summary (Last 24 hours) at 06/21/2018 0751 Last data filed at 06/21/2018 0600 Gross per 24 hour  Intake 1366.64 ml  Output 1265 ml  Net 101.64 ml    Physical Exam: CVP 17 General: NAD Neck: JVP 14-16, no thyromegaly or thyroid nodule.  Lungs: Mild crackles at bases.  CV: lateral PMI.  Heart regular S1/S2, no S3/S4, 1/6 SEM RUSB.  No peripheral edema.   Abdomen: Soft, nontender, no hepatosplenomegaly, no distention.  Skin: Intact without lesions or rashes.  Neurologic: Alert and oriented x 3.  Psych: Normal affect. Extremities: No clubbing or cyanosis.  HEENT: Normal.    Telemetry: A-V sequential pacing at 90 Personally reviewed    Labs: Basic Metabolic Panel: Recent Labs  Lab 06/14/18 1709  06/16/18 0401  06/18/18 0221 06/18/18 0645 06/19/18 0344 06/20/18 0415 06/21/18 0353  NA  --    < > 132*   < > 130* 132* 131* 133* 132*  K  --    < > 3.7   < > 3.2* 3.7 3.5 3.7 4.6  CL  --    < > 90*   < > 86*  87* 88* 88* 85*  CO2  --    < > 26   < > _0 GLUCOSE  --    < > 209*   < > 166* 164* 143* 130* 122*  BUN  --    < > 81*   < > 110* 111* 120* 124* 135*  CREATININE  --    < > 4.41*   < > 5.06* 5.11* 5.55* 6.04* 6.36*  CALCIUM  --    < > 8.3*   < > 8.0* 8.2* 8.4* 8.7* 8.4*  MG 2.4  --  2.2  --  2.3  --   --   --   --   PHOS 4.3  --  3.3  --   --   --   --   --   --    < > = values in this interval not displayed.    Liver Function Tests: Recent Labs  Lab 06/17/18 0913 06/18/18 0221  AST 49* 45*  ALT 14 13  ALKPHOS 159* 189*  BILITOT 0.9 0.9  PROT 5.3* 5.6*  ALBUMIN 2.1* 2.1*   No results for input(s): LIPASE, AMYLASE in  the last 168 hours. No results for input(s): AMMONIA in the last 168 hours.  CBC: Recent Labs  Lab 06/17/18 0440 06/18/18 0221 06/18/18 2319 06/19/18 0344 06/19/18 0628 06/20/18 0415 06/21/18 0353  WBC 9.4 9.0  --  8.9  --  8.0 7.7  HGB 10.7* 11.0* 11.2* 11.3* 11.3* 11.4* 11.1*  HCT 31.4* 32.5* 32.6* 32.3* 32.3* 33.0* 32.0*  MCV 93.5 91.5  --  91.0  --  91.9 91.2  PLT 171 186  --  181  --  178 173    Cardiac Enzymes: No results for input(s): CKTOTAL, CKMB, CKMBINDEX, TROPONINI in the last 168 hours.  BNP: BNP (last 3 results) Recent Labs    11/05/17 1310 12/02/17 1846 01/05/18 1431  BNP 515.3* 349.6* 516.7*    ProBNP (last 3 results) No results for input(s): PROBNP in the last 8760 hours.    Other results:  Imaging: Dg Chest Port 1 View  Result Date: 06/21/2018 CLINICAL DATA:  Respiratory difficulty EXAM: PORTABLE CHEST 1 VIEW COMPARISON:  06/18/2018 FINDINGS: Cardiac shadow remains enlarged. Pacing device is again seen. Right jugular central line and tunneled PICC line are again noted and stable. Endotracheal tube and gastric catheter have been withdrawn in the interval. Bilateral pleural effusions and underlying consolidation are again noted and stable. No new focal abnormality is seen. IMPRESSION: Stable bilateral effusions  and underlying consolidation. Electronically Signed   By: Inez Catalina M.D.   On: 06/21/2018 05:57   Dg Abd Portable 1v  Result Date: 06/20/2018 CLINICAL DATA:  Constipation for 3 days. Nausea this morning. Diabetes. Hypertension. Cholelithiasis. EXAM: PORTABLE ABDOMEN - 1 VIEW COMPARISON:  11/17/2017 FINDINGS: Three portable supine views. These demonstrate no free intraperitoneal air. A left-sided pleural effusion with possible developing loculation laterally. No gaseous distention of bowel loops. Cholecystectomy clips. Stool ball in the rectum of 6.3 cm. IMPRESSION: 1. No acute findings. 2. Left-sided pleural effusion with possible developing loculation laterally. 3. Suspect constipation or fecal impaction, given 6.3 cm stool ball in the rectum. Electronically Signed   By: Abigail Miyamoto M.D.   On: 06/20/2018 13:13     Medications:     Scheduled Medications: . chlorhexidine gluconate (MEDLINE KIT)  15 mL Mouth Rinse BID  . Chlorhexidine Gluconate Cloth  6 each Topical Q0600  . gabapentin  400 mg Oral QHS  . insulin aspart  0-9 Units Subcutaneous Q4H  . mouth rinse  15 mL Mouth Rinse BID  . Melatonin  6 mg Oral QHS  . multivitamin with minerals  1 tablet Oral Daily  . pantoprazole  40 mg Oral Daily  . polyethylene glycol  17 g Oral Daily  . rosuvastatin  10 mg Oral Daily  . sertraline  25 mg Oral Daily  . sodium chloride flush  10-40 mL Intracatheter Q12H  . tamsulosin  0.4 mg Oral Daily  . traZODone  50 mg Oral QHS  . vitamin B-12  500 mcg Oral QODAY  . zolpidem  5 mg Oral QHS    Infusions: . sodium chloride 5 mL/hr at 06/21/18 0600  . amiodarone 30 mg/hr (06/21/18 0600)  . cefTRIAXone (ROCEPHIN)  IV Stopped (06/21/18 0547)  . furosemide    . heparin 800 Units/hr (06/21/18 0725)  . norepinephrine (LEVOPHED) Adult infusion Stopped (06/21/18 0516)    PRN Medications: alum & mag hydroxide-simeth, hydrocortisone, ondansetron (ZOFRAN) IV, promethazine, sodium chloride flush,  traMADol   Assessment:   Dillon Andrews a 78 y.o.malewith a hx of chronic combined CHF due to  NICM with probable TTR amyloid, CKD III,presumedNASH(per pt, never proven),esophageal varices,  RBBB, PAF requiring multiple DCCV and now s/p atrial flutter ablation 05/22/18. He is on home milrinone 0.125 followed by [facility].   He was admitted for RV lead revision. Admission complicated by recurrent HF in setting of AF with RVR. On 2/28 decompensated with multiple episodes of VT complicated by respiratory failure leading to intubation and DC-CV   Plan/Discussion:    1. Acute on chronic systolic HF -> cardiogenic shock -NICM by cath at [facility] 09/2016 -Echo 06/2017: EF 25-30%, mild LAE, mild/mod RV dilation, mod/severe RA. Echo 10/2017: EF 10% -Echo12/19 EF 35-40%. He has been on home milrinone -PYP scan strongly suggestive of TTR amyloid, SPEP with 0.8% m-spike, UPEP with Bence Jones Protein positive, kappa type. 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 Dr Marin Olp saw and thinks he does not have AL amyloid (just multiple myeloma) and that he has TTR amyloid and should be treated with tafamidis before chemo. Tafamidis started on 11/14/17.  - Decompensated in setting of recurrent AF and VT. Now A-V sequentially paced.  - He is now off norepinephrine with co-ox 57%.  - Course c/b ATN and cardiorenal syndrome - He is volume overloaded by exam and CVP 17 today. Creatinine rising, up to 6.36 today. He had better UOP with Lasix 160 mg IV x 1 yesterday.  - I will give him Lasix 160 mg IV every 8 hrs today and follow response.  Suspect he will end up needing CVVH but no urgent indication.  - No ARB/entresto/spirowith for now with CKD - Has been off hydralazine/imdur. No b-blocker with low output  2.Paroxysmal  AtrialFlutter/Fib - s/p DCCV of 7/31, 8/2, 12/19/2017  -  s/p aflutter ablation 2/7 -  had recurrent AF on admit. S/p emergent DC-CV 2/28 - On 2/29 in junctional rhythm. Pacer now set  DD at 73 to force AV pacing. -  Continue IV amiodarone to keep him out of AF.  Failedpo amiodarone previously with recurrent nausea. But does not tolerate AF well and will need AA drug vs AV node ablation.   - On heparin drip.   3. VT - improved with IV lidocaine on 2/27 - now quiescent on IV amio - Keep K> 4.0 mg > 2.0  4. Acute hypoxic respiratory failure - Now extubated and stable.    5. h/o junctional rhythm with symptomatic bradycardia -> MDT PPM.  -RV lead revised this admission. Currently stable - EP following - Currently A-V sequential pacing.   6. Acute on chronic renal failure - likely due to ATN and cardiorenal syndrome - baseline creatinine 1.6-1.8 - Creatinine continues to rise-->3.9-->4.4->5.0->5.06 -> 6.04 -> 6.36.  - He would be ok with short-term CRRT but would be a candidate for (or want) long-term HD.  BUN/creatinine are worsening.  Though there is no emergent indication for CVVH (not confused, K not elevated), I think that he is heading in that direction.  I am concerned that if we start CVVH, he will not have recovery.  As above, long-term HD would not be a good option.  Will give Lasix 160 mg IV every 8 hrs today and follow (CVP high, not short of breath).  -Nephrology appreciated.   7. 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 Marin Olp . I spoke to him today regarding his admit.  -Started on Bortezomib7/18/18andRevlimid7/23/19. - Dr Marin Olp thinks he does not have AL amyloid and that he does have TTR amyloid and should  be treated with tafamidis before chemo. Continue Tafamidis.  - Off tafamidis when he was intubated.    8. LUE Hematoma  Wrap removed. LUE stable. Resolved. Hgb stable.   9. Hyponatremia  Sodium higher at 134.    10. Hypokalemia Supp K as needed to keep > 4.0  11. Hematuria/ UTI - He is on ceftriaxone for UTI.    Length of Stay: 8  CRITICAL CARE Performed by: Loralie Champagne  Total critical care time:  35 minutes  Critical care time was exclusive of separately billable procedures and treating other patients.  Critical care was necessary to treat or prevent imminent or life-threatening deterioration.  Critical care was time spent personally by me (independent of midlevel providers or residents) on the following activities: development of treatment plan with patient and/or surrogate as well as nursing, discussions with consultants, evaluation of patient's response to treatment, examination of patient, obtaining history from patient or surrogate, ordering and performing treatments and interventions, ordering and review of laboratory studies, ordering and review of radiographic studies, pulse oximetry and re-evaluation of patient's condition.  Loralie Champagne 06/21/2018 7:56 AM  Advanced Heart Failure Team Pager [phone] (M-F; [place])  Please contact [facility] for night-coverage after hours (4p -7a ) and weekends on [email or web address]

## 2018-06-21 NOTE — Progress Notes (Signed)
[place] for Heparin -apixaban on hold Indication: atrial fibrillation  No Known Allergies  Patient Measurements: Height: 5' 10"  (177.8 cm) Weight: 190 lb 11.2 oz (86.5 kg) IBW/kg (Calculated) : 73   Vital Signs: Temp: 97.7 F (36.5 C) (03/08 0300) Temp Source: Oral (03/08 0300) BP: 94/69 (03/08 0630) Pulse Rate: 89 (03/08 0630)  Labs: Recent Labs    06/18/18 2319 06/19/18 0344 06/19/18 0628 06/20/18 0415 06/21/18 0353  HGB 11.2* 11.3* 11.3* 11.4* 11.1*  HCT 32.6* 32.3* 32.3* 33.0* 32.0*  PLT  --  181  --  178 173  APTT  --  73*  --  65* 98*  LABPROT 21.3*  --   --   --   --   INR 1.9*  --   --   --   --   HEPARINUNFRC  --  >2.20*  --  2.02* 1.74*  CREATININE  --  5.55*  --  6.04* 6.36*    Estimated Creatinine Clearance: 9.9 mL/min (A) (by C-G formula based on SCr of 6.36 mg/dL (H)).   Assessment: 77yom intubated 2/27 to protect airway after having runs VT needing to be shocked.  VT quiet on amiodaone drip - EP following   Has been on apixaban for anticoaguation in setting of Afib.  Now with AKI Cr 5 and in ICU may need IV lines placed - will hold apixaban and start heparin drip.  CBC continues to be stable.  Apixaban can fasley elevated the HL will use aptt for heparin dosing until washout / correlation.   Large hematoma on L arm has some bleeding overnight - heparin held but now stable and heparin drip restarted.    Heparin drip 900 uts/hr aptt up to 98sec  HL remains elevated from apixaban interaction  - use PTT to dose heparin .   Goal of Therapy:  aptt 66-102 sec Heparin level 0.3-0.7 units/ml Monitor platelets by anticoagulation protocol: Yes   Plan:  decrease heparin infusion 980 units/hr Daily HL, APTT,CBC Monitor s/s bleeding and renal function   Bonnita Nasuti Pharm.D. CPP, BCPS Clinical Pharmacist [phone] 06/21/2018 7:20 AM

## 2018-06-21 NOTE — Progress Notes (Signed)
[facility] NEPHROLOGY PROGRESS NOTE  Assessment/ Plan: Pt is a 78 y.o. yo male with hypertension, type 2 diabetes, K-LC myeloma, NASH, esophageal varices, A. fib, CHF on home milrinone, CKD stage III with creatinine 1.4-1.7 at baseline admitted for decompensated heart failure and respiratory failure.  #Acute kidney injury on CKD stage III, nonoliguric due to cardiorenal syndrome and acute worsening creatinine due to ischemic ATN related with diuretics. -Patient received a dose of IV Lasix yesterday with increased urine output, the serum creatinine level worsened to 6.36 today.  At least, patient is responding with IV Lasix therefore I agree with Lasix 160 mg IV every 8 hour.  Monitor urine output and electrolytes.  I think he is heading towards requiring CRRT very soon, assess daily.  Patient is agreed for short-term dialysis if needed.  No urgent indication for CRRT today.  I think patient may not tolerate long-term dialysis.  I have been discussing this with the patient and multiple family members.  - Avoid IV contrast or hypotensive episode.  #Hyponatremia, hypervolemic: Serum sodium level stable.  #Cardiogenic shock: Levophed as needed.  Heart failure team is following.  #Atrial fibrillation/flutter on status post DCCV, on amiodarone and heparin.  #Acute respiratory failure with hypoxia currently intubated.  Per PCCM, patient is extubated.  Now on oxygen  #[place] chain myeloma and concern for cardiac amyloid: Status post treatment, follows with oncology.  I have discussed with Dr. Aundra Dubin.  Subjective: Seen and examined ICU.  Currently on high flow oxygen.  Starting Lasix.  Patient denied chest pain, shortness of breath, nausea vomiting.  His son is at bedside.  Objective Vital signs in last 24 hours: Vitals:   06/21/18 0630 06/21/18 0700 06/21/18 0800 06/21/18 0900  BP: 94/69 93/68 97/69 96/67  Pulse: 89 89 90 90  Resp: (!) 25 (!) 24 (!) 30 (!) 23  Temp:    (!) 97.5 F (36.4 C)   TempSrc:   Oral   SpO2: 94% 91% 94% 94%  Weight:      Height:       Weight change: 0.6 kg  Intake/Output Summary (Last 24 hours) at 06/21/2018 0939 Last data filed at 06/21/2018 0900 Gross per 24 hour  Intake 1558.72 ml  Output 1170 ml  Net 388.72 ml       Labs: Basic Metabolic Panel: Recent Labs  Lab 06/14/18 1709  06/16/18 0401  06/19/18 0344 06/20/18 0415 06/21/18 0353  NA  --    < > 132*   < > 131* 133* 132*  K  --    < > 3.7   < > 3.5 3.7 4.6  CL  --    < > 90*   < > 88* 88* 85*  CO2  --    < > 26   < > _0 GLUCOSE  --    < > 209*   < > 143* 130* 122*  BUN  --    < > 81*   < > 120* 124* 135*  CREATININE  --    < > 4.41*   < > 5.55* 6.04* 6.36*  CALCIUM  --    < > 8.3*   < > 8.4* 8.7* 8.4*  PHOS 4.3  --  3.3  --   --   --   --    < > = values in this interval not displayed.   Liver Function Tests: Recent Labs  Lab 06/17/18 0913 06/18/18 0221  AST 49*  45*  ALT 14 13  ALKPHOS 159* 189*  BILITOT 0.9 0.9  PROT 5.3* 5.6*  ALBUMIN 2.1* 2.1*   No results for input(s): LIPASE, AMYLASE in the last 168 hours. No results for input(s): AMMONIA in the last 168 hours. CBC: Recent Labs  Lab 06/17/18 0440 06/18/18 0221  06/19/18 0344 06/19/18 0628 06/20/18 0415 06/21/18 0353  WBC 9.4 9.0  --  8.9  --  8.0 7.7  HGB 10.7* 11.0*   < > 11.3* 11.3* 11.4* 11.1*  HCT 31.4* 32.5*   < > 32.3* 32.3* 33.0* 32.0*  MCV 93.5 91.5  --  91.0  --  91.9 91.2  PLT 171 186  --  181  --  178 173   < > = values in this interval not displayed.   Cardiac Enzymes: No results for input(s): CKTOTAL, CKMB, CKMBINDEX, TROPONINI in the last 168 hours. CBG: Recent Labs  Lab 06/20/18 1127 06/20/18 1952 06/20/18 2350 06/21/18 0335 06/21/18 0801  GLUCAP 111* 100* 115* 122* 123*    Iron Studies: No results for input(s): IRON, TIBC, TRANSFERRIN, FERRITIN in the last 72 hours. Studies/Results: Dg Chest Port 1 View  Result Date: 06/21/2018 CLINICAL DATA:   Respiratory difficulty EXAM: PORTABLE CHEST 1 VIEW COMPARISON:  06/18/2018 FINDINGS: Cardiac shadow remains enlarged. Pacing device is again seen. Right jugular central line and tunneled PICC line are again noted and stable. Endotracheal tube and gastric catheter have been withdrawn in the interval. Bilateral pleural effusions and underlying consolidation are again noted and stable. No new focal abnormality is seen. IMPRESSION: Stable bilateral effusions and underlying consolidation. Electronically Signed   By: Inez Catalina M.D.   On: 06/21/2018 05:57   Dg Abd Portable 1v  Result Date: 06/20/2018 CLINICAL DATA:  Constipation for 3 days. Nausea this morning. Diabetes. Hypertension. Cholelithiasis. EXAM: PORTABLE ABDOMEN - 1 VIEW COMPARISON:  11/17/2017 FINDINGS: Three portable supine views. These demonstrate no free intraperitoneal air. A left-sided pleural effusion with possible developing loculation laterally. No gaseous distention of bowel loops. Cholecystectomy clips. Stool ball in the rectum of 6.3 cm. IMPRESSION: 1. No acute findings. 2. Left-sided pleural effusion with possible developing loculation laterally. 3. Suspect constipation or fecal impaction, given 6.3 cm stool ball in the rectum. Electronically Signed   By: Abigail Miyamoto M.D.   On: 06/20/2018 13:13    Medications: Infusions: . sodium chloride Stopped (06/21/18 0834)  . amiodarone 30 mg/hr (06/21/18 0900)  . cefTRIAXone (ROCEPHIN)  IV Stopped (06/21/18 0547)  . furosemide 66 mL/hr at 06/21/18 0900  . heparin 800 Units/hr (06/21/18 0900)  . norepinephrine (LEVOPHED) Adult infusion Stopped (06/21/18 0516)    Scheduled Medications: . chlorhexidine gluconate (MEDLINE KIT)  15 mL Mouth Rinse BID  . Chlorhexidine Gluconate Cloth  6 each Topical Q0600  . gabapentin  400 mg Oral QHS  . insulin aspart  0-9 Units Subcutaneous Q4H  . mouth rinse  15 mL Mouth Rinse BID  . Melatonin  6 mg Oral QHS  . multivitamin with minerals  1 tablet  Oral Daily  . pantoprazole  40 mg Oral Daily  . polyethylene glycol  17 g Oral Daily  . rosuvastatin  10 mg Oral Daily  . sertraline  25 mg Oral Daily  . sodium chloride flush  10-40 mL Intracatheter Q12H  . tamsulosin  0.4 mg Oral Daily  . traZODone  50 mg Oral QHS  . vitamin B-12  500 mcg Oral QODAY  . zolpidem  5 mg Oral QHS  have reviewed scheduled and prn medications.  Physical Exam: General: Alert awake, not in distress Heart:RRR, s1s2 nl, no rubs,  Lungs: No wheezing or crackle, no increased work of breathing Abdomen:soft, Non-tender, non-distended Extremities: Has dependent edema Dialysis Access: None Neurology: Alert awake, no asterixis  Hayley Horn Prasad Anyelin Mogle 06/21/2018,9:39 AM  LOS: 8 days

## 2018-06-21 NOTE — Progress Notes (Addendum)
Called by nurse for chest pain. Pain started 15 minutes prior with inspiration. Nurse reports no change in hemodynamics or rhythm, appears to have been borderline hypotensive all day with HR 90. EKG shows AV paced rhythm. He got a dose of oxycodone yesterday for similar pain per report. Repeat dose was administered a short while ago - I spoke with Dr. Radford Pax coming on call and requested she f/u to see how pt is feeling. We discussed pt's complex history - no new recs at this time aside from following response to meds. He has already been anticoagulated it appears for majority of admission. With renal failure not a great candidate to expedite any invasive evaluations. I called back and spoke with nurse and he was resting quietly in room with eyes closed. Dr. Radford Pax will follow. Dayna Dunn PA-C

## 2018-06-22 LAB — BASIC METABOLIC PANEL
Anion gap: 16 — ABNORMAL HIGH (ref 5–15)
BUN: 141 mg/dL — ABNORMAL HIGH (ref 8–23)
CO2: 28 mmol/L (ref 22–32)
Calcium: 8.3 mg/dL — ABNORMAL LOW (ref 8.9–10.3)
Chloride: 86 mmol/L — ABNORMAL LOW (ref 98–111)
Creatinine, Ser: 6.75 mg/dL — ABNORMAL HIGH (ref 0.61–1.24)
GFR calc Af Amer: 8 mL/min — ABNORMAL LOW (ref >60)
GFR calc non Af Amer: 7 mL/min — ABNORMAL LOW (ref >60)
Glucose, Bld: 140 mg/dL — ABNORMAL HIGH (ref 70–99)
Potassium: 5.6 mmol/L — ABNORMAL HIGH (ref 3.5–5.1)
Sodium: 130 mmol/L — ABNORMAL LOW (ref 135–145)

## 2018-06-22 LAB — GLUCOSE, CAPILLARY
GLUCOSE-CAPILLARY: 129 mg/dL — AB (ref 70–99)
Glucose-Capillary: 134 mg/dL — ABNORMAL HIGH (ref 70–99)
Glucose-Capillary: 134 mg/dL — ABNORMAL HIGH (ref 70–99)
Glucose-Capillary: 154 mg/dL — ABNORMAL HIGH (ref 70–99)

## 2018-06-22 LAB — COOXEMETRY PANEL
Carboxyhemoglobin: 0.8 % (ref 0.5–1.5)
Methemoglobin: 1.7 % — ABNORMAL HIGH (ref 0.0–1.5)
O2 SAT: 54.6 %
Total hemoglobin: 11.5 g/dL — ABNORMAL LOW (ref 12.0–16.0)

## 2018-06-22 LAB — CBC
HCT: 31.9 % — ABNORMAL LOW (ref 39.0–52.0)
Hemoglobin: 11 g/dL — ABNORMAL LOW (ref 13.0–17.0)
MCH: 31.1 pg (ref 26.0–34.0)
MCHC: 34.5 g/dL (ref 30.0–36.0)
MCV: 90.1 fL (ref 80.0–100.0)
Platelets: 179 10*3/uL (ref 150–400)
RBC: 3.54 MIL/uL — ABNORMAL LOW (ref 4.22–5.81)
RDW: 19.4 % — ABNORMAL HIGH (ref 11.5–15.5)
WBC: 7.4 10*3/uL (ref 4.0–10.5)
nRBC: 0 % (ref 0.0–0.2)

## 2018-06-22 LAB — HEPARIN LEVEL (UNFRACTIONATED): Heparin Unfractionated: 1.06 IU/mL — ABNORMAL HIGH (ref 0.30–0.70)

## 2018-06-22 LAB — APTT: aPTT: 91 seconds — ABNORMAL HIGH (ref 24–36)

## 2018-06-22 MED ORDER — HEPARIN SODIUM (PORCINE) 1000 UNIT/ML DIALYSIS
1000.0000 [IU] | INTRAMUSCULAR | Status: DC | PRN
  Filled 2018-06-22: qty 6

## 2018-06-22 MED ORDER — MIDAZOLAM HCL (PF) 5 MG/ML IJ SOLN: 1.0000 mg | Freq: Once | INTRAMUSCULAR | Status: AC

## 2018-06-22 MED ORDER — MIDAZOLAM HCL 2 MG/2ML IJ SOLN
INTRAMUSCULAR | Status: AC
  Administered 2018-06-22: 1 mg
  Filled 2018-06-22: qty 2

## 2018-06-22 MED ORDER — MIDAZOLAM 50MG/50ML (1MG/ML) PREMIX INFUSION
1.0000 mg/h | INTRAVENOUS | Status: DC
  Administered 2018-06-22: 1 mg/h via INTRAVENOUS
  Filled 2018-06-22: qty 50

## 2018-06-22 MED ORDER — HEPARIN SODIUM (PORCINE) 1000 UNIT/ML DIALYSIS: 1000.0000 [IU] | INTRAMUSCULAR | Status: DC | PRN

## 2018-06-22 MED ORDER — SODIUM CHLORIDE 0.9 % FOR CRRT
INTRAVENOUS_CENTRAL | Status: DC | PRN
  Filled 2018-06-22: qty 1000

## 2018-06-22 MED ORDER — FENTANYL CITRATE (PF) 100 MCG/2ML IJ SOLN
INTRAMUSCULAR | Status: AC
  Administered 2018-06-22: 25 ug via INTRAVENOUS
  Filled 2018-06-22: qty 2

## 2018-06-22 MED ORDER — PRISMASOL BGK 4/2.5 32-4-2.5 MEQ/L REPLACEMENT SOLN
Status: DC
  Filled 2018-06-22 (×2): qty 5000

## 2018-06-22 MED ORDER — FENTANYL CITRATE (PF) 100 MCG/2ML IJ SOLN
25.0000 ug | Freq: Once | INTRAMUSCULAR | Status: AC
  Administered 2018-06-22: 25 ug via INTRAVENOUS

## 2018-06-22 MED ORDER — FENTANYL 2500MCG IN NS 250ML (10MCG/ML) PREMIX INFUSION
50.0000 ug/h | INTRAVENOUS | Status: DC
  Administered 2018-06-22: 50 ug/h via INTRAVENOUS
  Filled 2018-06-22: qty 250

## 2018-06-22 MED ORDER — PRISMASOL BGK 4/2.5 32-4-2.5 MEQ/L IV SOLN
INTRAVENOUS | Status: DC
  Filled 2018-06-22 (×11): qty 5000

## 2018-06-25 ENCOUNTER — Ambulatory Visit: Payer: Medicare HMO

## 2018-06-26 ENCOUNTER — Telehealth (HOSPITAL_COMMUNITY): Payer: Self-pay

## 2018-06-26 NOTE — Telephone Encounter (Signed)
Called the [employer]. Left voicemail that the death certificate was ready to be picked up at the front desk of the [facility].

## 2018-07-15 NOTE — Progress Notes (Signed)
225 mL Fentanyl and 50 mL Versed wasted with Casilda Carls, RN.

## 2018-07-15 NOTE — Progress Notes (Signed)
NAME:  [facility], MRN:  [id number], DOB:  [birth year], LOS: 48 ADMISSION DATE:  05/18/2018, CONSULTATION DATE:  06/12/2018 REFERRING MD:  Sela Hua, MD, CHIEF COMPLAINT:  Respiratory failure  Brief History   Mr. Gerdeman is a 78 YO gentleman with history significant for Chronic systolic HF on home milrinone due to TTR amyloidosis, atrial flutter, pace maker placement due to complete heart block with recent RV lead revision, chronic anticoagulation, NASH with esophageal varices  and CKD who is transferred to the CCU from the floor after he developed multiple episodes of rapid VT with hemodynamic instability requiring intubation and cardioversion  Past Medical History  AFlutter: H/o ablation, known to have multiple circuits, NASH W/esophageal varicies NICM, Chronic CHF On home milrinone, DM, HTN, CKD (III)  Significant Hospital Events   -Endotracheal intubation 2/28 -Right IJ central line 2/28 -Left radial A-line 2/28 -DC cardioversion 2/28 3/3 still on pressors and lasix gtt. Starting PSV trial 3/4: Lasix and additional diuretics all discontinued due to rising creatinine up to 5.  Still requiring norepinephrine however weaning this down slowly.  Developed increased accessory use when pressure support limited to 5.  Chest x-ray unchanged with bilateral pleural effusions and pulmonary edema.  Nephrology approaching family about possible dialysis 3/5: Reattempting diuresis.  Chest x-ray unchanged.  Renal function seems to have hit a plateau.  Trying to avoid dialysis 3/6: Down 2 pounds, but creatinine now up to 5.5 from 5.  He has passed his spontaneous breathing trial, extubated   Consults:  -PCCM  Procedures:  As above  Significant Diagnostic Tests:  05/22/2018: EPS/ablation:  1. Right atrial flutter upon presentation. 2. Successful radiofrequency ablation of atrial flutter along the cavotricuspid isthmus with complete bidirectional isthmus block achieved. 3. No early apparent  complications.   Echo 12.31/2019 - Left ventricle: The cavity size was normal. Wall thickness was   increased in a pattern of severe LVH. Systolic function was   moderately reduced. The estimated ejection fraction was in the   range of 35% to 40%. Diffuse hypokinesis. Doppler parameters are   consistent with a reversible restrictive pattern, indicative of   decreased left ventricular diastolic compliance and/or increased   left atrial pressure (grade 3 diastolic dysfunction). - Ventricular septum: The contour showed diastolic flattening. - Aortic valve: Trileaflet; mildly thickened, mildly calcified   leaflets. - Mitral valve: There was mild regurgitation. - Left atrium: The atrium was moderately dilated. - Right ventricle: The cavity size was moderately dilated. Wall   thickness was normal. - Right atrium: The atrium was moderately dilated. - Pulmonary arteries: Systolic pressure was moderately to severely   increased. PA peak pressure: 68 mm Hg (S).   11/10/2017: TEE/DCCV - Left ventricle: The estimated ejection fraction was 10%. Diffusehypokinesis. There was spontaneous echo contrast, indicative ofstasis. No evidence of thrombus. - Aortic valve: No evidence of vegetation. - Mitral valve: No evidence of vegetation. There was mild regurgitation. - Left atrium: The atrium was moderately dilated. No evidence of thrombus in the atrial cavity or appendage. No evidence of thrombus in the atrial cavity or appendage. - Right ventricle: Systolic function was severely reduced. - Right atrium: The atrium was dilated. - Atrial septum: No defect or patent foramen ovale was identified. - Tricuspid valve: No evidence of vegetation. There was moderate regurgitation. - Pulmonic valve: No evidence of vegetation. Impressions: - Successful cardioversion. No cardiac source of emboli was indentified.  Micro Data:  -None   Antimicrobials:  -None   Subjective  Alert, off  pressors. Discussion with family at bedside.   Objective   Blood pressure (!) 82/61, pulse 93, temperature 97.8 F (36.6 C), temperature source Oral, resp. rate (!) 25, height 5' 10"  (1.778 m), weight 86.8 kg, SpO2 95 %. CVP:  [14 mmHg-18 mmHg] 15 mmHg      Intake/Output Summary (Last 24 hours) at 07-22-2018 1034 Last data filed at 07-22-18 6579 Gross per 24 hour  Intake 1278.3 ml  Output 805 ml  Net 473.3 ml   Filed Weights   06/20/18 0430 06/21/18 0430 07/22/2018 0500  Weight: 85.9 kg 86.5 kg 86.8 kg    Examination:  General: elderly male, resting in bed no apparent distress  HEENT: NCAT, sclera clear  Pulmonary: diminshed BL breath sounds in the bases, no wheeze  Cardiac: RRR, distant heart tones, s1 s2  Abdomen: soft, nt, mildly distended  Extremities: BL LE edema  Neuro: AAOX3, moves all four extremties  GU: foley in place   Assessment & Plan:    Acute hypoxic respiratory failure d/t acute pulmonary edema and pleural effusions- Extubated 3/6  Plan Doing well  Tolerating  Will continue to observe   Cardiogenic Shock in setting of acute on chronic systolic and diastolic HF H/o TTR amyloid Off pressors 3/7  Plan Holding antihypertensives, per cardiology   Multiple arrhythmias: VT/ fib/flutter Plan Heparin IV per pharmacy amio per cardiology   Acute on chronic Kidney disease, with associated electrolyte imbalance Serum creatinine continues to tr up .  Plan Lasix per nephrology and cardiology  I agree with cardiology/nephro that he would likely not be a good candidate for CRRT. He would likely end up on permanante dialysis. Therefore, if the patient would not want these things considerations for transition to comfort care would be appropriate.   Hyperglycemia Plan SSI   Anemia w/out evidence of bleeding Plan Observing   Possible urinary tract infection Plan Rocephin   Hx of NASH  Constipation-resolved, large BM 3/7  Nausea   Plan  miralax    Protein Calorie Malnutrition Plan  Supplements per dietary   Pulmonary will be available. Please call with any questions.    Best practice:  Diet: clear liquid  Pain/Anxiety/Delirium protocol (if indicated): Fentanyl, Midazolam: Discontinued 3/6 VAP protocol (if indicated): HoB elevated, PPI, Oral care /extubated 3/6  DVT prophylaxis: Eliquis>, placed on heparin gtt  GI prophylaxis: Pantoprazole  Glucose control: Maintain euglycemia  Mobility: PT and OT to evaluate and treat as appropriate  Code Status: Full code  Family Communication: Family updated  Disposition: ICU    Garner Nash, DO [facility] 07/22/18 10:35 AM  Personal pager: [phone] If unanswered, please page CCM On-call: [phone]

## 2018-07-15 NOTE — Progress Notes (Addendum)
[facility] for Heparin -apixaban on hold Indication: atrial fibrillation  No Known Allergies  Patient Measurements: Height: 5' 10"  (177.8 cm) Weight: 191 lb 5.8 oz (86.8 kg) IBW/kg (Calculated) : 73   Vital Signs: Temp: 97.6 F (36.4 C) (03/09 0352) Temp Source: Oral (03/09 0352) BP: 91/64 (03/09 0700) Pulse Rate: 89 (03/09 0700)  Labs: Recent Labs    06/20/18 0415 06/21/18 0353 06/30/2018 0416  HGB 11.4* 11.1* 11.0*  HCT 33.0* 32.0* 31.9*  PLT 178 173 179  APTT 65* 98* 91*  HEPARINUNFRC 2.02* 1.74* 1.06*  CREATININE 6.04* 6.36* 6.75*    Estimated Creatinine Clearance: 9.3 mL/min (A) (by C-G formula based on SCr of 6.75 mg/dL (H)).   Assessment: [facility] intubated 2/27 to protect airway after having runs VT needing to be shocked.  VT quiet on amiodaone drip - EP following   Has been on apixaban for anticoaguation in setting of Afib.  Now with AKI SCr up and in ICU may need IV lines placed - will hold apixaban and start heparin drip.  CBC continues to be stable.  Apixaban can fasley elevated the heparin level, so will use aptt for heparin dosing until washout / correlation.   Large hematoma on L arm has some bleeding 3/7 - heparin held but then restarted.    Heparin drip 800 uts/hr currently, with aPTT in goal range.  Heparin level remains elevated from apixaban interaction  - using PTT to dose heparin.   Goal of Therapy:  aptt 66-102 sec Heparin level 0.3-0.7 units/ml Monitor platelets by anticoagulation protocol: Yes   Plan:  Continue IV heparin at current rate. F/u plans for placement of HD access, may need to hold heparin. Daily heparin level, aPTT and CBC.  [facility], [facility] Clinical Pharmacist Phone [phone]  06-30-18 7:27 AM

## 2018-07-15 NOTE — Progress Notes (Addendum)
Patient ID: Dillon Andrews, male   DOB: 1940/06/19, 78 y.o.   MRN: [id number]   Advanced Heart Failure Rounding Note   Subjective:    He was extubated 3/6. Has remained awake and alert. Fatigued, but no SOB.   A-V sequential pacing.   Creatinine continues to rise. Up to 6.75 this am, with BUN 141, and K 5.6 despite IV lasix 160 mg q 8 hrs yesterday. Decreasing UOP. Only 885 cc of UOP yesterday for postive I/Os overall.   CVP 15. Coox 54.6%.  He is on High flow O2 at 15 lpm via [place], and continues to de-sat with exertion. Denies SOB at rest.   He remains on amiodarone 30, heparin gtt.   Objective:   Weight Range:  Vital Signs:   Temp:  [97.5 F (36.4 C)-98 F (36.7 C)] 97.6 F (36.4 C) (03/09 0352) Pulse Rate:  [85-95] 89 (03/09 0700) Resp:  [14-30] 16 (03/09 0700) BP: (81-104)/(53-70) 91/64 (03/09 0700) SpO2:  [90 %-96 %] 96 % (03/09 0700) Weight:  [86.8 kg] 86.8 kg (03/09 0500) Last BM Date: 06/21/18  Weight change: Filed Weights   06/20/18 0430 06/21/18 0430 06-29-18 0500  Weight: 85.9 kg 86.5 kg 86.8 kg    Intake/Output:   Intake/Output Summary (Last 24 hours) at 06-29-2018 0711 Last data filed at June 29, 2018 0600 Gross per 24 hour  Intake 1591.47 ml  Output 885 ml  Net 706.47 ml     Physical Exam   CVP 15  General: NAD  HEENT: Normal Neck: Supple. JVP to jaw. Carotids 2+ bilat; no bruits. No thyromegaly or nodule noted. Cor: PMI lateral, RRR, 1/6 SEM RUSB.  Lungs: diminished Abdomen: Soft, non-tender, non-distended, no HSM. No bruits or masses. +BS  Extremities: No cyanosis, clubbing, or rash. R and LLE no edema.  Neuro: Alert & orientedx3, cranial nerves grossly intact. moves all 4 extremities w/o difficulty. Affect pleasant   Telemetry   A-V sequential pacing at 90, personally reviewed.   Labs    Basic Metabolic Panel: Recent Labs  Lab 06/16/18 0401  06/18/18 0221 06/18/18 0645 06/19/18 0344 06/20/18 0415 06/21/18 0353 06/29/18 0416  NA  132*   < > 130* 132* 131* 133* 132* 130*  K 3.7   < > 3.2* 3.7 3.5 3.7 4.6 5.6*  CL 90*   < > 86* 87* 88* 88* 85* 86*  CO2 26   < > 31 30 29 28 26 28   GLUCOSE 209*   < > 166* 164* 143* 130* 122* 140*  BUN 81*   < > 110* 111* 120* 124* 135* 141*  CREATININE 4.41*   < > 5.06* 5.11* 5.55* 6.04* 6.36* 6.75*  CALCIUM 8.3*   < > 8.0* 8.2* 8.4* 8.7* 8.4* 8.3*  MG 2.2  --  2.3  --   --   --   --   --   PHOS 3.3  --   --   --   --   --   --   --    < > = values in this interval not displayed.    Liver Function Tests: Recent Labs  Lab 06/17/18 0913 06/18/18 0221  AST 49* 45*  ALT 14 13  ALKPHOS 159* 189*  BILITOT 0.9 0.9  PROT 5.3* 5.6*  ALBUMIN 2.1* 2.1*   No results for input(s): LIPASE, AMYLASE in the last 168 hours. No results for input(s): AMMONIA in the last 168 hours.  CBC: Recent Labs  Lab 06/18/18 0221  06/19/18 0344  06/19/18 6789 06/20/18 0415 06/21/18 0353 07-11-2018 0416  WBC 9.0  --  8.9  --  8.0 7.7 7.4  HGB 11.0*   < > 11.3* 11.3* 11.4* 11.1* 11.0*  HCT 32.5*   < > 32.3* 32.3* 33.0* 32.0* 31.9*  MCV 91.5  --  91.0  --  91.9 91.2 90.1  PLT 186  --  181  --  178 173 179   < > = values in this interval not displayed.    Cardiac Enzymes: No results for input(s): CKTOTAL, CKMB, CKMBINDEX, TROPONINI in the last 168 hours.  BNP: BNP (last 3 results) Recent Labs    11/05/17 1310 12/02/17 1846 01/05/18 1431  BNP 515.3* 349.6* 516.7*    ProBNP (last 3 results) No results for input(s): PROBNP in the last 8760 hours.    Other results:  Imaging: Dg Chest Port 1 View  Result Date: 06/21/2018 CLINICAL DATA:  Respiratory difficulty EXAM: PORTABLE CHEST 1 VIEW COMPARISON:  06/18/2018 FINDINGS: Cardiac shadow remains enlarged. Pacing device is again seen. Right jugular central line and tunneled PICC line are again noted and stable. Endotracheal tube and gastric catheter have been withdrawn in the interval. Bilateral pleural effusions and underlying consolidation  are again noted and stable. No new focal abnormality is seen. IMPRESSION: Stable bilateral effusions and underlying consolidation. Electronically Signed   By: Inez Catalina M.D.   On: 06/21/2018 05:57   Dg Abd Portable 1v  Result Date: 06/20/2018 CLINICAL DATA:  Constipation for 3 days. Nausea this morning. Diabetes. Hypertension. Cholelithiasis. EXAM: PORTABLE ABDOMEN - 1 VIEW COMPARISON:  11/17/2017 FINDINGS: Three portable supine views. These demonstrate no free intraperitoneal air. A left-sided pleural effusion with possible developing loculation laterally. No gaseous distention of bowel loops. Cholecystectomy clips. Stool ball in the rectum of 6.3 cm. IMPRESSION: 1. No acute findings. 2. Left-sided pleural effusion with possible developing loculation laterally. 3. Suspect constipation or fecal impaction, given 6.3 cm stool ball in the rectum. Electronically Signed   By: Abigail Miyamoto M.D.   On: 06/20/2018 13:13     Medications:     Scheduled Medications: . chlorhexidine gluconate (MEDLINE KIT)  15 mL Mouth Rinse BID  . Chlorhexidine Gluconate Cloth  6 each Topical Q0600  . feeding supplement (NEPRO CARB STEADY)  237 mL Oral BID  . gabapentin  400 mg Oral QHS  . insulin aspart  0-9 Units Subcutaneous Q4H  . mouth rinse  15 mL Mouth Rinse BID  . Melatonin  6 mg Oral QHS  . multivitamin with minerals  1 tablet Oral Daily  . pantoprazole  40 mg Oral Daily  . polyethylene glycol  17 g Oral Daily  . rosuvastatin  10 mg Oral Daily  . sertraline  25 mg Oral Daily  . sodium chloride flush  10-40 mL Intracatheter Q12H  . tamsulosin  0.4 mg Oral Daily  . traZODone  50 mg Oral QHS  . vitamin B-12  500 mcg Oral QODAY    Infusions: . sodium chloride 5 mL/hr at 07/11/2018 0600  . amiodarone 30 mg/hr (11-Jul-2018 0600)  . cefTRIAXone (ROCEPHIN)  IV Stopped (07-11-18 0426)  . furosemide Stopped (11-Jul-2018 0203)  . 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 He is on home milrinone 0.125 followed by [facility].   He was admitted for RV lead revision. Admission complicated by recurrent HF in setting of AF with RVR. On 2/28 decompensated with multiple episodes of VT complicated by respiratory failure leading to intubation and DC-CV   Plan/Discussion:    1. Acute on chronic systolic HF -> cardiogenic shock -NICM by cath at [facility] 09/2016 -Echo 06/2017: EF 25-30%, mild LAE, mild/mod RV dilation, mod/severe RA. Echo 10/2017: EF 10% -Echo12/19 EF 35-40%. He has been on home milrinone -PYP scan strongly suggestive of TTR amyloid, SPEP with 0.8% m-spike, UPEP with Bence Jones Protein positive, kappa type. 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 Dr Marin Olp saw and thinks he does not have AL amyloid (just multiple myeloma) and that he has TTR amyloid and should be treated with tafamidis before chemo. Tafamidis started on 11/14/17.  - Decompensated in setting of recurrent AF and VT. Now A-V sequentially paced.  - He is now off norepinephrine with co-ox 54.6%.  - Course c/b ATN and cardiorenal syndrome - Remains volume overloaded and not responding to IV lasix 160 q 8. Will likely need CVVHD,  - No ARB/entresto/spirowith for now with CKD - Has been off hydralazine/imdur. No b-blocker with low output  2.Paroxysmal  AtrialFlutter/Fib - s/p DCCV of 7/31, 8/2, 12/19/2017  -  s/p aflutter ablation 2/7 -  had recurrent AF on admit. S/p emergent DC-CV 2/28 - On 2/29 in junctional rhythm. Pacer now set DD at 40 to force AV pacing. -  Continue IV amiodarone to keep him out of AF.  Failedpo amiodarone  previously with recurrent nausea. But does not tolerate AF well and will need AA drug vs AV node ablation.   - On heparin drip.   3. VT - Improved with IV lidocaine on 2/27 - Now quiescent on IV amio - Keep K> 4.0 mg > 2.0  4. Acute hypoxic respiratory failure - Now extubated and stable.    5. h/o junctional rhythm with symptomatic bradycardia -> MDT PPM.  -RV lead revised this admission. Currently stable.  - EP following - Currently A-V sequential pacing.   6. Acute on chronic renal failure - likely due to ATN and cardiorenal syndrome - baseline creatinine 1.6-1.8 - Creatinine continues to rise-->3.9-->4.4->5.0->5.06 -> 6.04 -> 6.36 -> 6.75 - K 5.6 this am and BUN 141. Suspect will need to start CRRT.  - He is not a candidate for long-term HD, nor would he want.   7. 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 Marin Olp . He is aware of admit.  -Started on Bortezomib7/18/18andRevlimid7/23/19. - Dr Marin Olp thinks he does not have AL amyloid and that he does have TTR amyloid and should be treated with tafamidis before chemo. Continue Tafamidis.  - Off tafamidis when he was intubated.    8. LUE Hematoma  - Hgb stable at  11. - LUE stable with wrap removed.   9. Hyponatremia  - Na 130 this am.  10. Hypokalemia -> hyperkalemia in setting of ARF - K 3.7 -> 4.6 -> 5.6 in setting of renal failure.   11. Hematuria/ UTI - He is on ceftriaxone for UTI.  No change.    Suspect will need CVVHD starting today vs tomorrow. Will discuss plan with MD.   Length of Stay: [place], [place]  Jun 24, 2018 7:11 AM  Advanced Heart Failure Team Pager  [phone] (M-F; 7a - 4p)  Please contact [facility] for night-coverage after hours (4p -7a ) and weekends on [email or web address]  Agree with above.   Renal function continues to worsen. He is weak and uremic but conscious and can answer questions. CVP 15. Off NE. Co-ox marginal at 55%. Remains in NSR on  IV amio.   On exam pale weak JVP to jaw Cor RRR Lungs clear Ab soft NT  Ext 1 + edema. + asterixis  His renal function continues to worsen and he is now uremic and mildly volume overloaded. I had long talk with him and his family. They understand that he is not a candidate for long-term HD (nor would he want it). We discussed possible CVVHD with the understanding that his kidneys are unlikely to recover. They will discuss this am and decide. If they decide against CVVHD will need to switch to comfort care as only other option.   CRITICAL CARE Performed by: Glori Bickers  Total critical care time: 35 minutes  Critical care time was exclusive of separately billable procedures and treating other patients.  Critical care was necessary to treat or prevent imminent or life-threatening deterioration.  Critical care was time spent personally by me (independent of midlevel providers or residents) on the following activities: development of treatment plan with patient and/or surrogate as well as nursing, discussions with consultants, evaluation of patient's response to treatment, examination of patient, obtaining history from patient or surrogate, ordering and performing treatments and interventions, ordering and review of laboratory studies, ordering and review of radiographic studies, pulse oximetry and re-evaluation of patient's condition.  Glori Bickers, MD  8:57 AM

## 2018-07-15 NOTE — Progress Notes (Signed)
  I discussed the situation with Dillon Andrews and his 2 daughters this afternoon.   He understands how sick he is and says he is tired and does not want to continue to pursue aggressive life-sustaining measures. He says he would like "to rest and got to sleep." He is concerned about suffering.  With the help of the MDT rep we interrogated PPM and he has an underlying junctional rhythm in the 30s.   We have made him Full No Code and will start comfort drips. His family is discussing turning off his PM as well as he was clear that he would not want artificial life-sustaining therapies. They will let us know.   Additional CCT 35 mins  Glori Bickers, MD  4:03 PM

## 2018-07-15 NOTE — Progress Notes (Addendum)
   Patient with increasing respiratory difficulty and uremia.   Family at bedside. Comfort drips initiated and titrated to control air hunger. Pacing discontinued with stable junctional rhythm at 40bpm underneath.   Developed asystole at 1646. Patient pronounced by me. Family at bedside.  I was present int he room for the entire time.   Additional CCT 45 mins.   Glori Bickers, MD  7:07 PM

## 2018-07-15 NOTE — Progress Notes (Signed)
[facility] ROUNDING NOTE   Subjective:   This is a very pleasant 78 year old gentleman with a history of chronic combined congestive heart failure from nonischemic cardiomyopathy, probably has cardiac amyloidosis.  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  He has a history of paroxysmal atrial fibrillation requiring multiple direct-current cardioversions status post atrial flutter ablation 05/22/2018.  He is on home milrinone 0.125 mcg.  He was admitted for recurrent congestive heart failure with atrial fibrillation rapid ventricular rate.  His last 2D echo on 03/2018 showed an ejection fraction of 35 to 40%..  It appears that his AL amyloid is being treated with TAVR meds since 11/14/2017.  He has an AV pacemaker that is sequentially paced.  He remains volume overloaded and will need CRRT.  Blood pressure 86/70 pulse 90 temperature 97.8 O2 sats 92% high flow oxygen  IV amiodarone 30 mg/h, IV heparin Rocephin 2 g every 24 hours, Lasix 160 mg every 8 hours, Crestor 10 mg daily Zoloft 25 mg daily trazodone 50 mg daily insulin sliding scale, Protonix 40 mg daily, Neurontin 400 mg nightly melatonin 6 mg nightly  Urine output 1 L 06/21/2018 weight 86.8 kg increase 0.3 kg since 06/21/2018  Sodium 130 potassium 5.6 chloride 86 CO2 28 BUN 141 creatinine 6.75 glucose 140 calcium 8.3 WBC 7.4 hemoglobin 11.0 platelets 179.  Chest x-ray bilateral effusions underlying consolidation 06/21/2018   Objective:  Vital signs in last 24 hours:  Temp:  [97.6 F (36.4 C)-98 F (36.7 C)] 97.8 F (36.6 C) (03/09 0700) Pulse Rate:  [85-95] 90 (03/09 0800) Resp:  [14-27] 17 (03/09 0800) BP: (81-104)/(53-70) 94/70 (03/09 0800) SpO2:  [90 %-96 %] 95 % (03/09 0800) Weight:  [86.8 kg] 86.8 kg (03/09 0500)  Weight change: 0.3 kg Filed Weights   06/20/18 0430 06/21/18 0430 07/09/18 0500  Weight: 85.9 kg 86.5 kg 86.8 kg    Intake/Output: I/O last  3 completed shifts: In: 2037.7 [P.O.:480; I.V.:1038.6; NG/GT:120; IV Piggyback:399.1] Out: 1385 [Urine:1385]   Intake/Output this shift:  Total I/O In: 227.7 [P.O.:120; I.V.:70.7; IV Piggyback:37.1] Out: 200 [Urine:200]   Alert and oriented x3 CVS- RRR JVP to jaw 1/6 systolic ejection murmur RS-diminished air entry throughout lung fields ABD- BS present soft non-distended EXT- no edema   Basic Metabolic Panel: Recent Labs  Lab 06/16/18 0401  06/18/18 0221 06/18/18 0645 06/19/18 0344 06/20/18 0415 06/21/18 0353 07-09-2018 0416  NA 132*   < > 130* 132* 131* 133* 132* 130*  K 3.7   < > 3.2* 3.7 3.5 3.7 4.6 5.6*  CL 90*   < > 86* 87* 88* 88* 85* 86*  CO2 26   < > 31 30 29 28 26 28   GLUCOSE 209*   < > 166* 164* 143* 130* 122* 140*  BUN 81*   < > 110* 111* 120* 124* 135* 141*  CREATININE 4.41*   < > 5.06* 5.11* 5.55* 6.04* 6.36* 6.75*  CALCIUM 8.3*   < > 8.0* 8.2* 8.4* 8.7* 8.4* 8.3*  MG 2.2  --  2.3  --   --   --   --   --   PHOS 3.3  --   --   --   --   --   --   --    < > = values in this interval not displayed.    Liver Function Tests: Recent Labs  Lab 06/17/18 0913 06/18/18 0221  AST 49* 45*  ALT 14 13  ALKPHOS 159* 189*  BILITOT 0.9 0.9  PROT 5.3* 5.6*  ALBUMIN 2.1* 2.1*   No results for input(s): LIPASE, AMYLASE in the last 168 hours. No results for input(s): AMMONIA in the last 168 hours.  CBC: Recent Labs  Lab 06/18/18 0221  06/19/18 0344 06/19/18 0628 06/20/18 0415 06/21/18 0353 07/08/2018 0416  WBC 9.0  --  8.9  --  8.0 7.7 7.4  HGB 11.0*   < > 11.3* 11.3* 11.4* 11.1* 11.0*  HCT 32.5*   < > 32.3* 32.3* 33.0* 32.0* 31.9*  MCV 91.5  --  91.0  --  91.9 91.2 90.1  PLT 186  --  181  --  178 173 179   < > = values in this interval not displayed.    Cardiac Enzymes: No results for input(s): CKTOTAL, CKMB, CKMBINDEX, TROPONINI in the last 168 hours.  BNP: Invalid input(s): POCBNP  CBG: Recent Labs  Lab 06/21/18 1613 06/21/18 1940  06/21/18 2345 2018-07-08 0354 July 08, 2018 0736  GLUCAP 141* 168* 154* 129* 134*    Microbiology: Results for orders placed or performed during the hospital encounter of 05/28/2018  Surgical PCR screen     Status: None   Collection Time: 05/19/2018 12:58 PM  Result Value Ref Range Status   MRSA, PCR NEGATIVE NEGATIVE Final   Staphylococcus aureus NEGATIVE NEGATIVE Final    Comment: (NOTE) The Xpert SA Assay (FDA approved for NASAL specimens in patients 76 years of age and older), is one component of a comprehensive surveillance program. It is not intended to diagnose infection nor to guide or monitor treatment. Performed at [facility], [place] [address], [place], [place] [address]     Coagulation Studies: No results for input(s): LABPROT, INR in the last 72 hours.  Urinalysis: No results for input(s): COLORURINE, LABSPEC, PHURINE, GLUCOSEU, HGBUR, BILIRUBINUR, KETONESUR, PROTEINUR, UROBILINOGEN, NITRITE, LEUKOCYTESUR in the last 72 hours.  Invalid input(s): APPERANCEUR    Imaging: Dg Chest Port 1 View  Result Date: 06/21/2018 CLINICAL DATA:  Respiratory difficulty EXAM: PORTABLE CHEST 1 VIEW COMPARISON:  06/18/2018 FINDINGS: Cardiac shadow remains enlarged. Pacing device is again seen. Right jugular central line and tunneled PICC line are again noted and stable. Endotracheal tube and gastric catheter have been withdrawn in the interval. Bilateral pleural effusions and underlying consolidation are again noted and stable. No new focal abnormality is seen. IMPRESSION: Stable bilateral effusions and underlying consolidation. Electronically Signed   By: Inez Catalina M.D.   On: 06/21/2018 05:57   Dg Abd Portable 1v  Result Date: 06/20/2018 CLINICAL DATA:  Constipation for 3 days. Nausea this morning. Diabetes. Hypertension. Cholelithiasis. EXAM: PORTABLE ABDOMEN - 1 VIEW COMPARISON:  11/17/2017 FINDINGS: Three portable supine views. These demonstrate no free intraperitoneal air. A  left-sided pleural effusion with possible developing loculation laterally. No gaseous distention of bowel loops. Cholecystectomy clips. Stool ball in the rectum of 6.3 cm. IMPRESSION: 1. No acute findings. 2. Left-sided pleural effusion with possible developing loculation laterally. 3. Suspect constipation or fecal impaction, given 6.3 cm stool ball in the rectum. Electronically Signed   By: Abigail Miyamoto M.D.   On: 06/20/2018 13:13     Medications:   . sodium chloride Stopped (07-08-2018 0754)  . amiodarone 30 mg/hr (07-08-2018 0828)  . cefTRIAXone (ROCEPHIN)  IV Stopped (2018-07-08 0426)  . furosemide 66 mL/hr at 07/08/18 0828  . heparin 800 Units/hr (08-Jul-2018 0828)   . chlorhexidine gluconate (MEDLINE KIT)  15 mL Mouth Rinse BID  . Chlorhexidine Gluconate  Cloth  6 each Topical [id number]  . feeding supplement (NEPRO CARB STEADY)  237 mL Oral BID  . gabapentin  400 mg Oral QHS  . insulin aspart  0-9 Units Subcutaneous Q4H  . mouth rinse  15 mL Mouth Rinse BID  . Melatonin  6 mg Oral QHS  . multivitamin with minerals  1 tablet Oral Daily  . pantoprazole  40 mg Oral Daily  . polyethylene glycol  17 g Oral Daily  . rosuvastatin  10 mg Oral Daily  . sertraline  25 mg Oral Daily  . sodium chloride flush  10-40 mL Intracatheter Q12H  . tamsulosin  0.4 mg Oral Daily  . traZODone  50 mg Oral QHS  . vitamin B-12  500 mcg Oral QODAY   alum & mag hydroxide-simeth, hydrocortisone, ondansetron (ZOFRAN) IV, promethazine, sodium chloride flush, traMADol  Assessment/ Plan:   Acute kidney injury on chronic kidney disease with baseline serum creatinine 1.4-1.7 worsening secondary to ischemic ATN, hypotension.  Appears to have a little response to IV Lasix.  It appears that we are heading towards CRRT this is been discussed per nephrology and cardiology.  May not be a good candidate for long-term dialysis.  Cardiogenic shock currently being managed by cardiology.  Last 2D echo 04/03/2019 shows an ejection  fraction 35 to 40%.  He has a PYP scan strongly suggestive of ATTR amyloid.  He is being followed by Dr. Marin Olp.  He is being treated currently with tafamidis.  These were started 11/14/2017 he is now decompensated in the setting of atrial fibrillation of ventricular tachyarrhythmia now with an AV sequential pacemaker.  His norepinephrine has been discontinued his co-ox is 54.6%.  Continue to avoid ACE inhibitors and ARB's IV contrast and nephrotoxic agents  Paroxysmal atrial fibrillation status post TEE direct-current cardioversion 7/31, 8 /2, 12/19/2017 emergent DC cardioversion 06/12/2018.  Continues on IV amiodarone and heparin drip  Ventricular tachycardia was treated with IV lidocaine and now on IV amiodarone  Hypoxic respiratory failure now extubated and stable  History of junctional rhythm with symptomatic bradycardia status post permanent pacemaker with AV sequential pacing  TR cardiac amyloid followed by Dr. Marin Olp treated with bortezomib and Revlimid he will continue outpatient treatment  Left upper telemetry hematoma stable  Hyponatremia should correct with CRRT  Hypokalemia we will try to maintain electrolyte balance with CRRT   LOS: [place] @TODAY @9 :28 AM

## 2018-07-15 NOTE — Death Summary Note (Signed)
  Advanced Heart Failure Death Summary  Death Summary   Patient ID: Dillon Andrews MRN: [id number], DOB/AGE: 1941/04/04 78 y.o. Admit date: 05/20/2018 D/C date:     July 02, 2018   Primary Discharge Diagnoses:  1. Acute on chronic systolic HF -> cardiogenic shock due to cardiac amyloidosis 2. AKI 3. Acute hypoxic respiratory failure 4. Ventricular tachycardia 5. Paroxysmal atrial fibrillation 6. Complete heart block with junctional rhythm s/p PPM  Hospital Course:   78 y/o male with advanced systolic HF due to cardiac amyloidosis on home milrinone, complete HB s/p permanent pacer, CKD 3, PAF and cirrhosis due to NASH.   He has been struggling with advanced HF and atrial dysrhythmias over the past year and was maintained on home milrinone.  Admitted on 2/27 for RV lead revision due to high pacing thresholds. RV lead revision by Dr. Curt Bears was successful with no immediate complications. Post procedure developed worsening HF and respiratory distress. Found to be in recurrent AF with decompensated HF. Started on IV amio and IV lasix with little relief. Became acutely worse on the night of 2/28 with runs of ventricular tachycardia. The HF team was consulted and he was found to be in cardiogenic shock with hypotension and respiratory failure. He was transferred to the ICU and intubated and underwent emergent DC-CV. He was started on norepinephrine for inotropic support.   Unfortunately course c/b acute on chronic renal failure with creatine rising over 6 from a baseline around 2. Nephrology consulted and patient not felt to be candidate for long-term HD due to severe HF. Was treated with IV lasix with some improvement in volume status. Patient extubated on 3/6 with stable respiratory status. On 3/9, developed worsening uremia with BUN/creatinine of 141/6.75. We discussed possible initiation of CVVHD but [name] and his family were clear that he would not want this and requested a shift to comfort  care.   Once all his family was present, comfort drips were started and his pacemaker was discontinued (stable underlying junctional rhythm at 40 bpm). He passed quietly at 1646 with his family at his bedside.    Signed, Glori Bickers, MD 2018/07/02, 7:08 PM

## 2018-07-15 DEATH — deceased

## 2018-08-18 ENCOUNTER — Ambulatory Visit: Payer: Medicare HMO | Admitting: Hematology & Oncology

## 2018-08-18 ENCOUNTER — Other Ambulatory Visit: Payer: Medicare HMO

## 2018-09-08 ENCOUNTER — Encounter: Payer: Medicare HMO | Admitting: Cardiology

## 2019-08-07 IMAGING — DX DG CHEST 2V
2 series · 2 of 2 positions shown · non-contrast
Comparison: Chest CT 03/20/2017.  Chest radiograph 11/25/2016

CLINICAL DATA: Midline lower chest tightness.  Shortness of breath.

EXAM:
CHEST  2 VIEW

[chest pa]
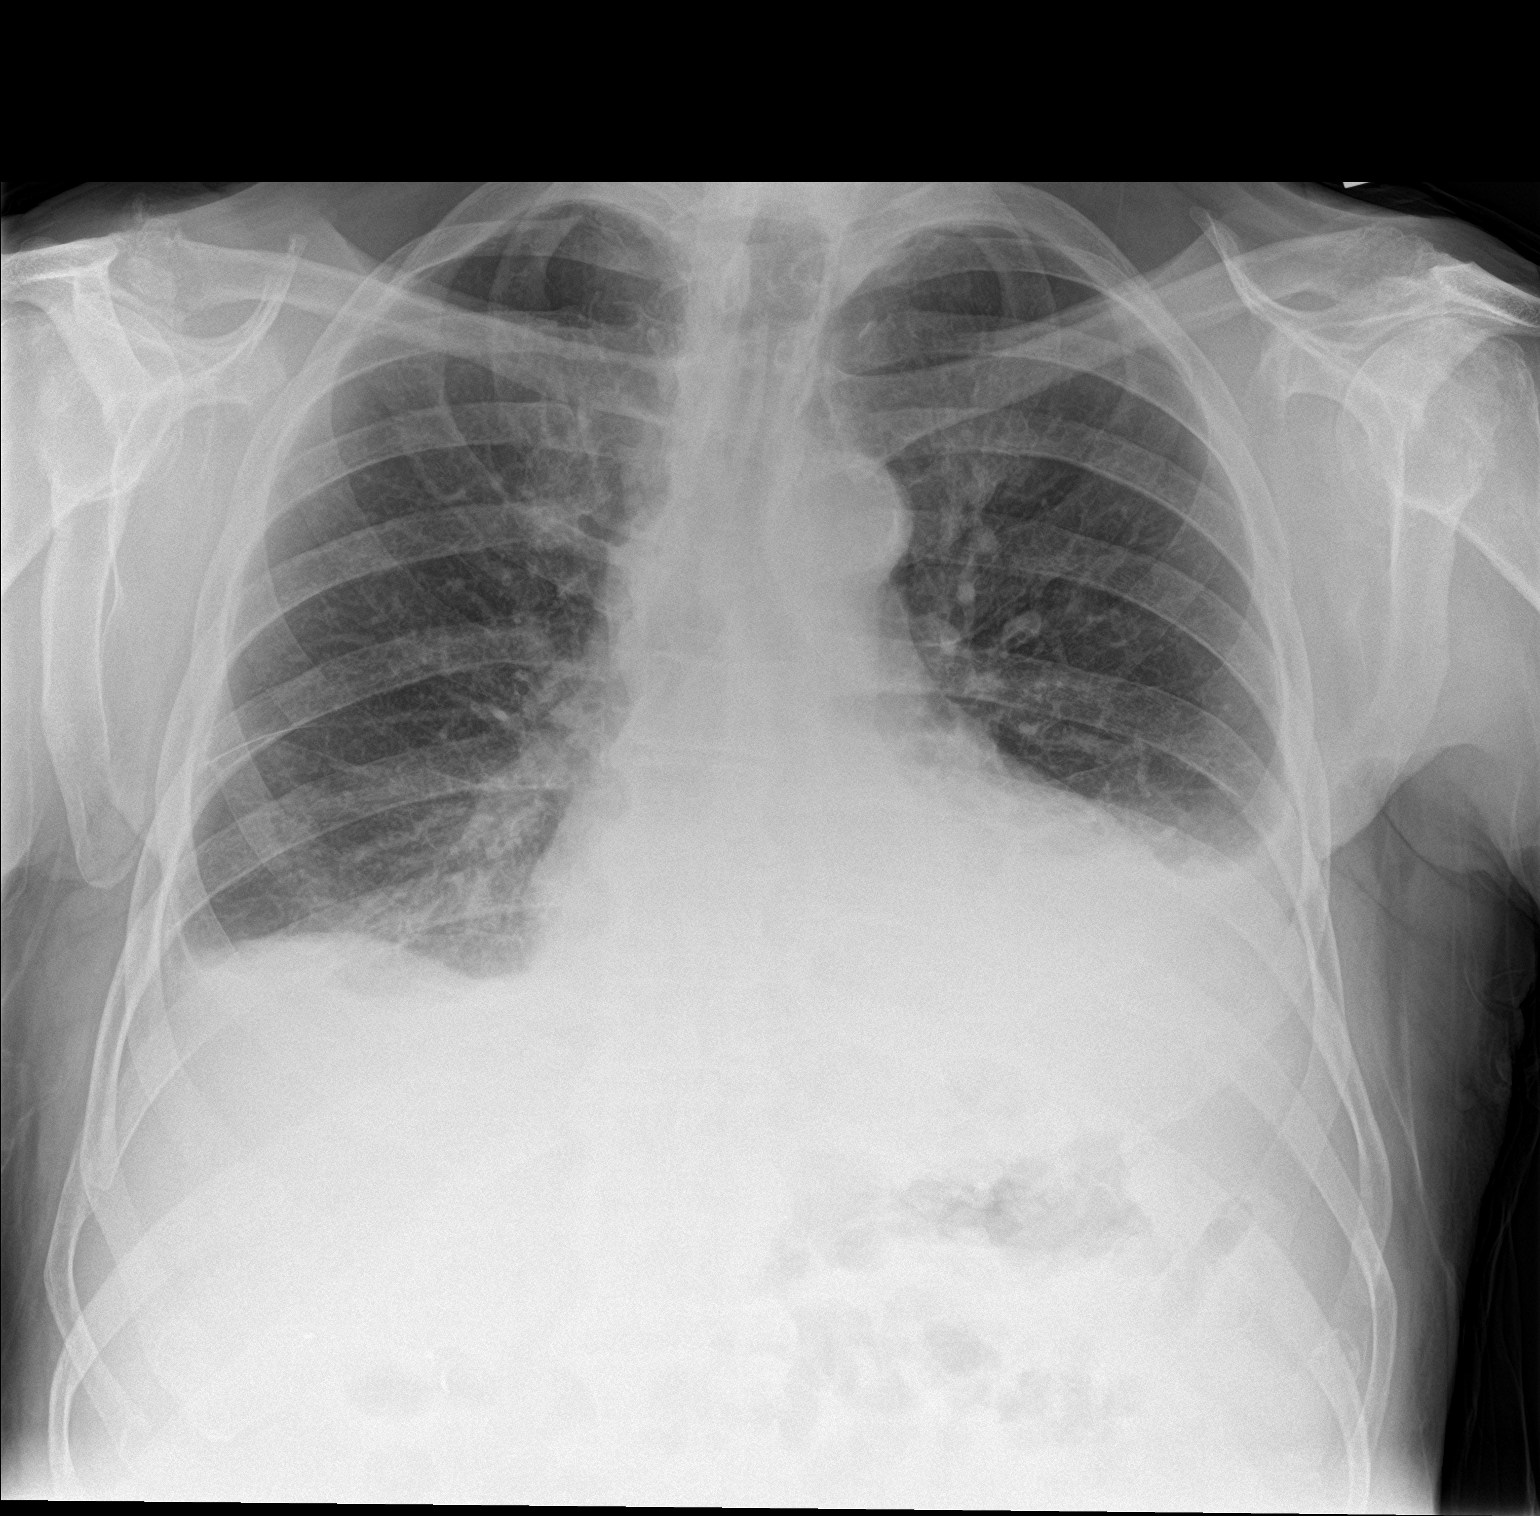

[chest lat]
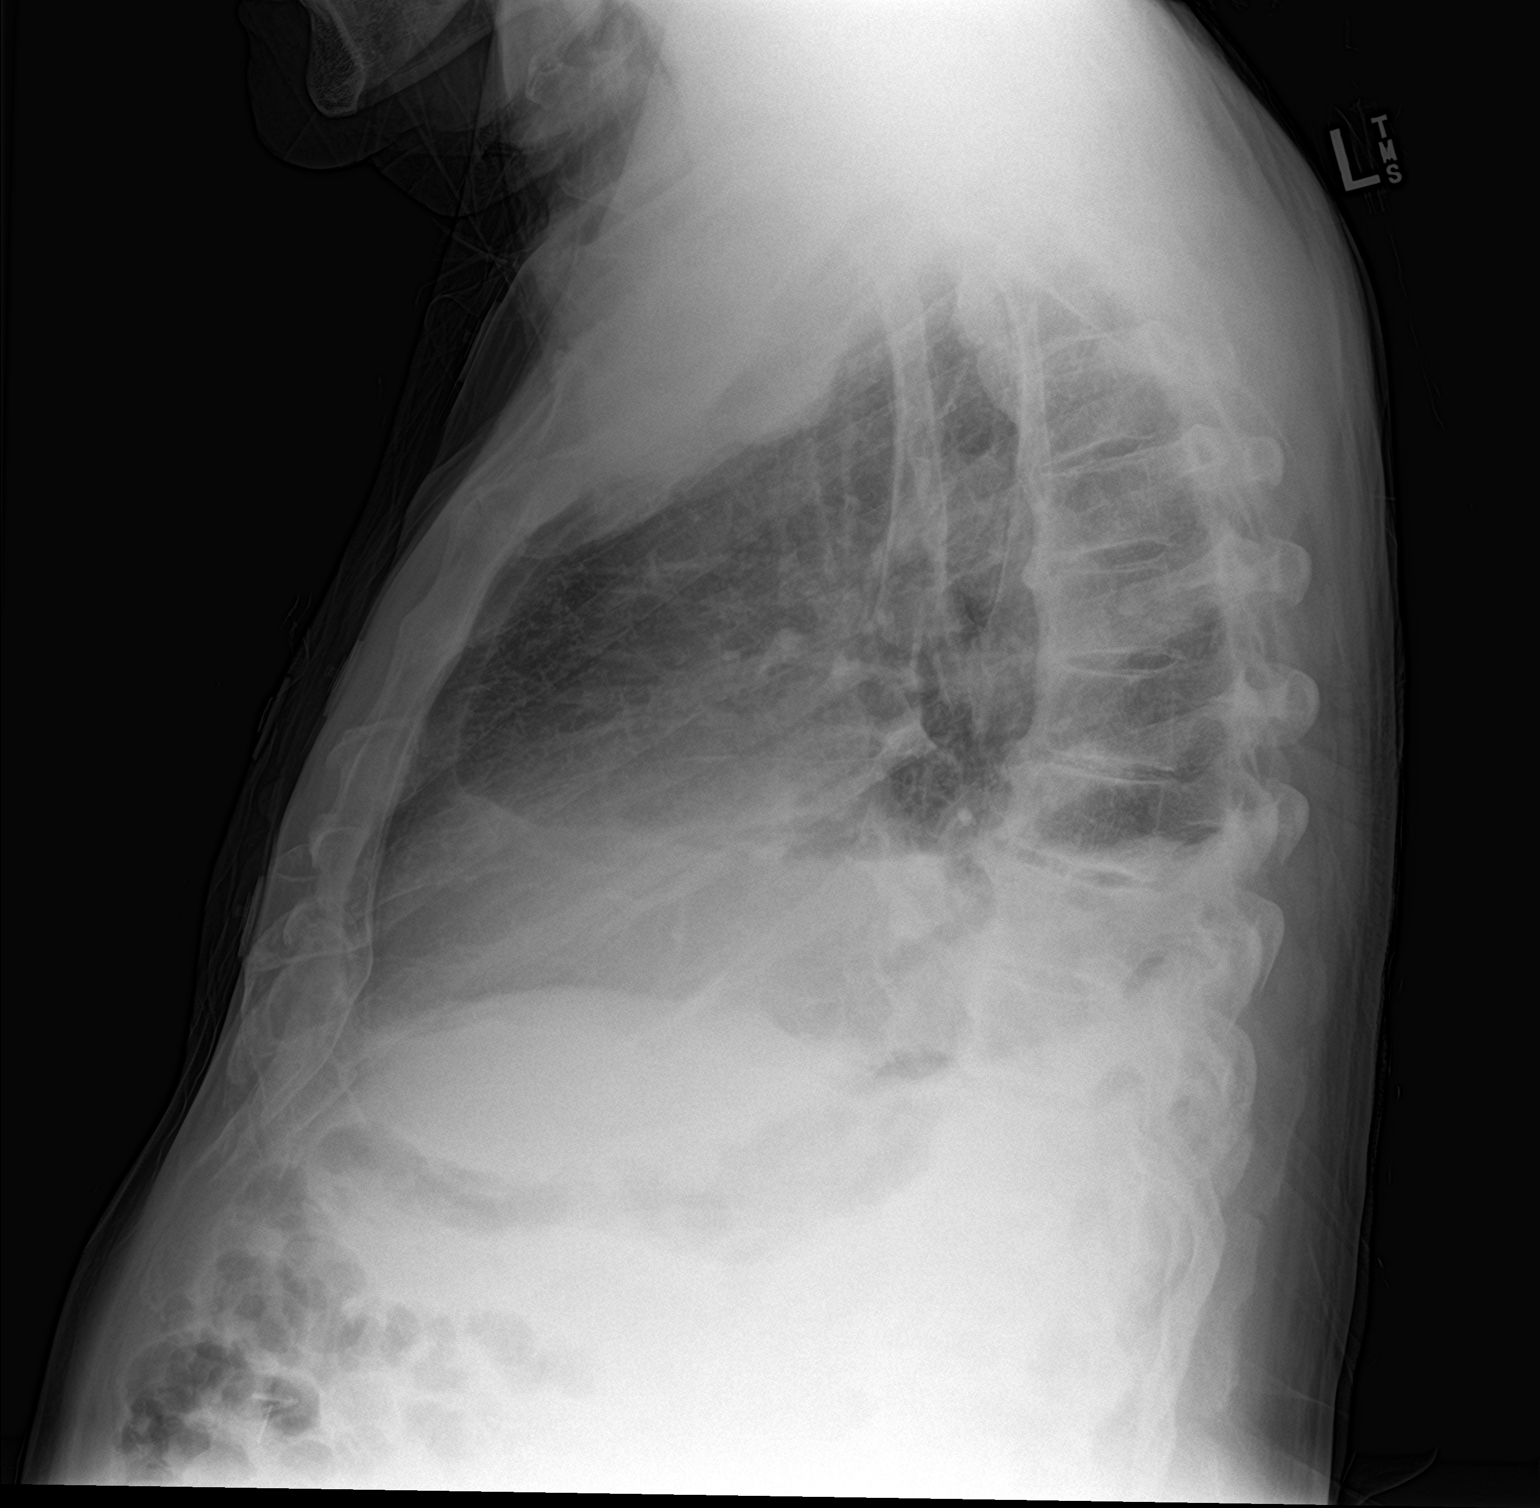

[2 of 2 positions shown; findings below may reference images not displayed]

FINDINGS: Cardiac silhouette remains upper limits of normal. There continues
to be evidence for bilateral pleural effusions, left side greater
than right. Upper lungs are clear without pulmonary edema.
Atherosclerotic calcifications at the aortic arch. Trachea is
midline. Bridging osteophytes throughout the thoracic spine.
Degenerative changes in both shoulders and AC joints.
IMPRESSION: Bilateral pleural effusions, left side greater than right. Left
pleural effusion is small-moderate in size. Right pleural effusion
is small in size. Minimal change from recent chest CT.

## 2020-03-29 IMAGING — DX DG CHEST 1V
1 series · 1 of 1 positions shown · non-contrast
Comparison: 11/11/2017, 11/05/2017

CLINICAL DATA: 77-year-old male with a history of fever

EXAM:
CHEST  1 VIEW

[chest]
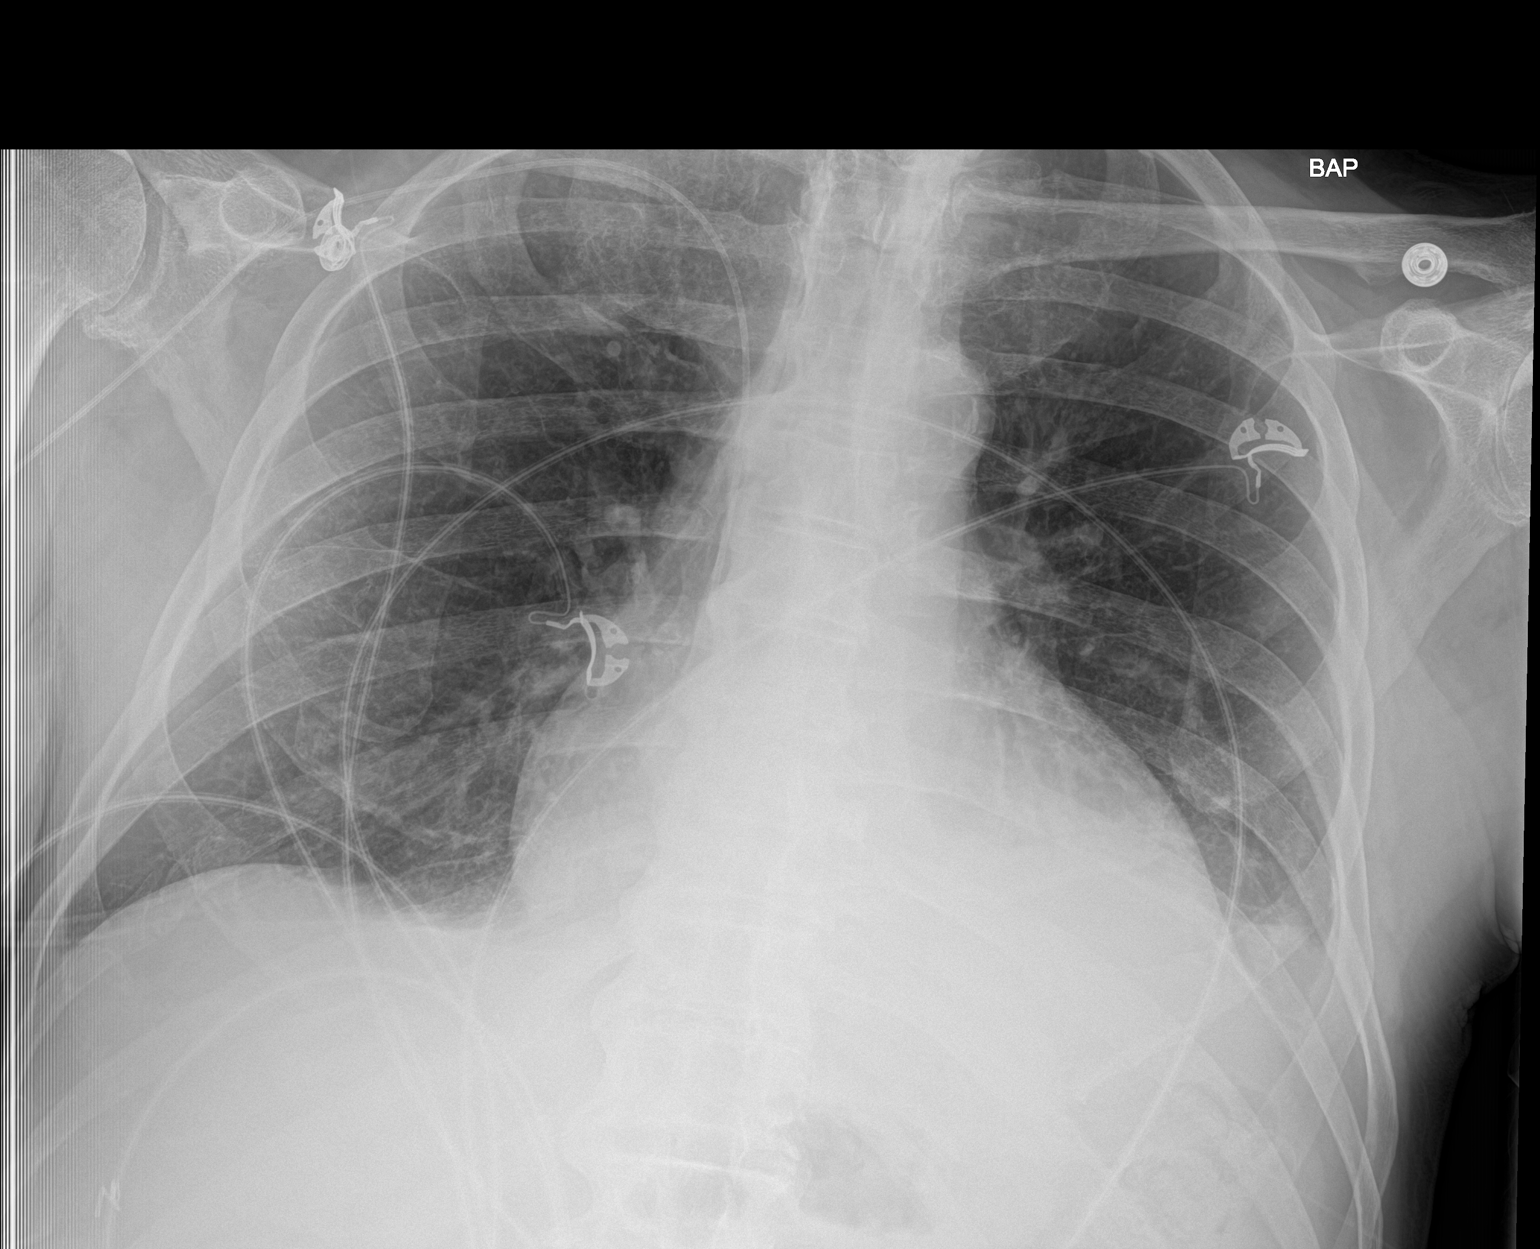

[1 of 1 positions shown; findings below may reference images not displayed]

FINDINGS: Cardiomediastinal silhouette unchanged.

Calcifications of the aortic arch.

Unchanged right upper extremity PICC which appears to terminate
superior vena cava.

Improving aeration at the lung bases with improved visualization of
the right hemidiaphragm and the right heart border. Persistent
opacity at the left base partially obscuring the left hemidiaphragm
with blunting of left costophrenic angle. No pneumothorax.
IMPRESSION: Improving aeration of the lungs, with near complete resolution of
right pleural effusion, with residual trace left pleural effusion.

Unchanged right upper extremity PICC

## 2020-03-29 IMAGING — DX DG ABD PORTABLE 1V
2 series · 2 of 2 positions shown · non-contrast
Comparison: 11/11/2017 chest x-ray.  11/25/2016 abdominal film.

CLINICAL DATA: 77-year-old male with abdominal pain and nausea with
chest pain. Initial encounter.

EXAM:
PORTABLE ABDOMEN - 1 VIEW

[abdomen kub (1 of 2)]
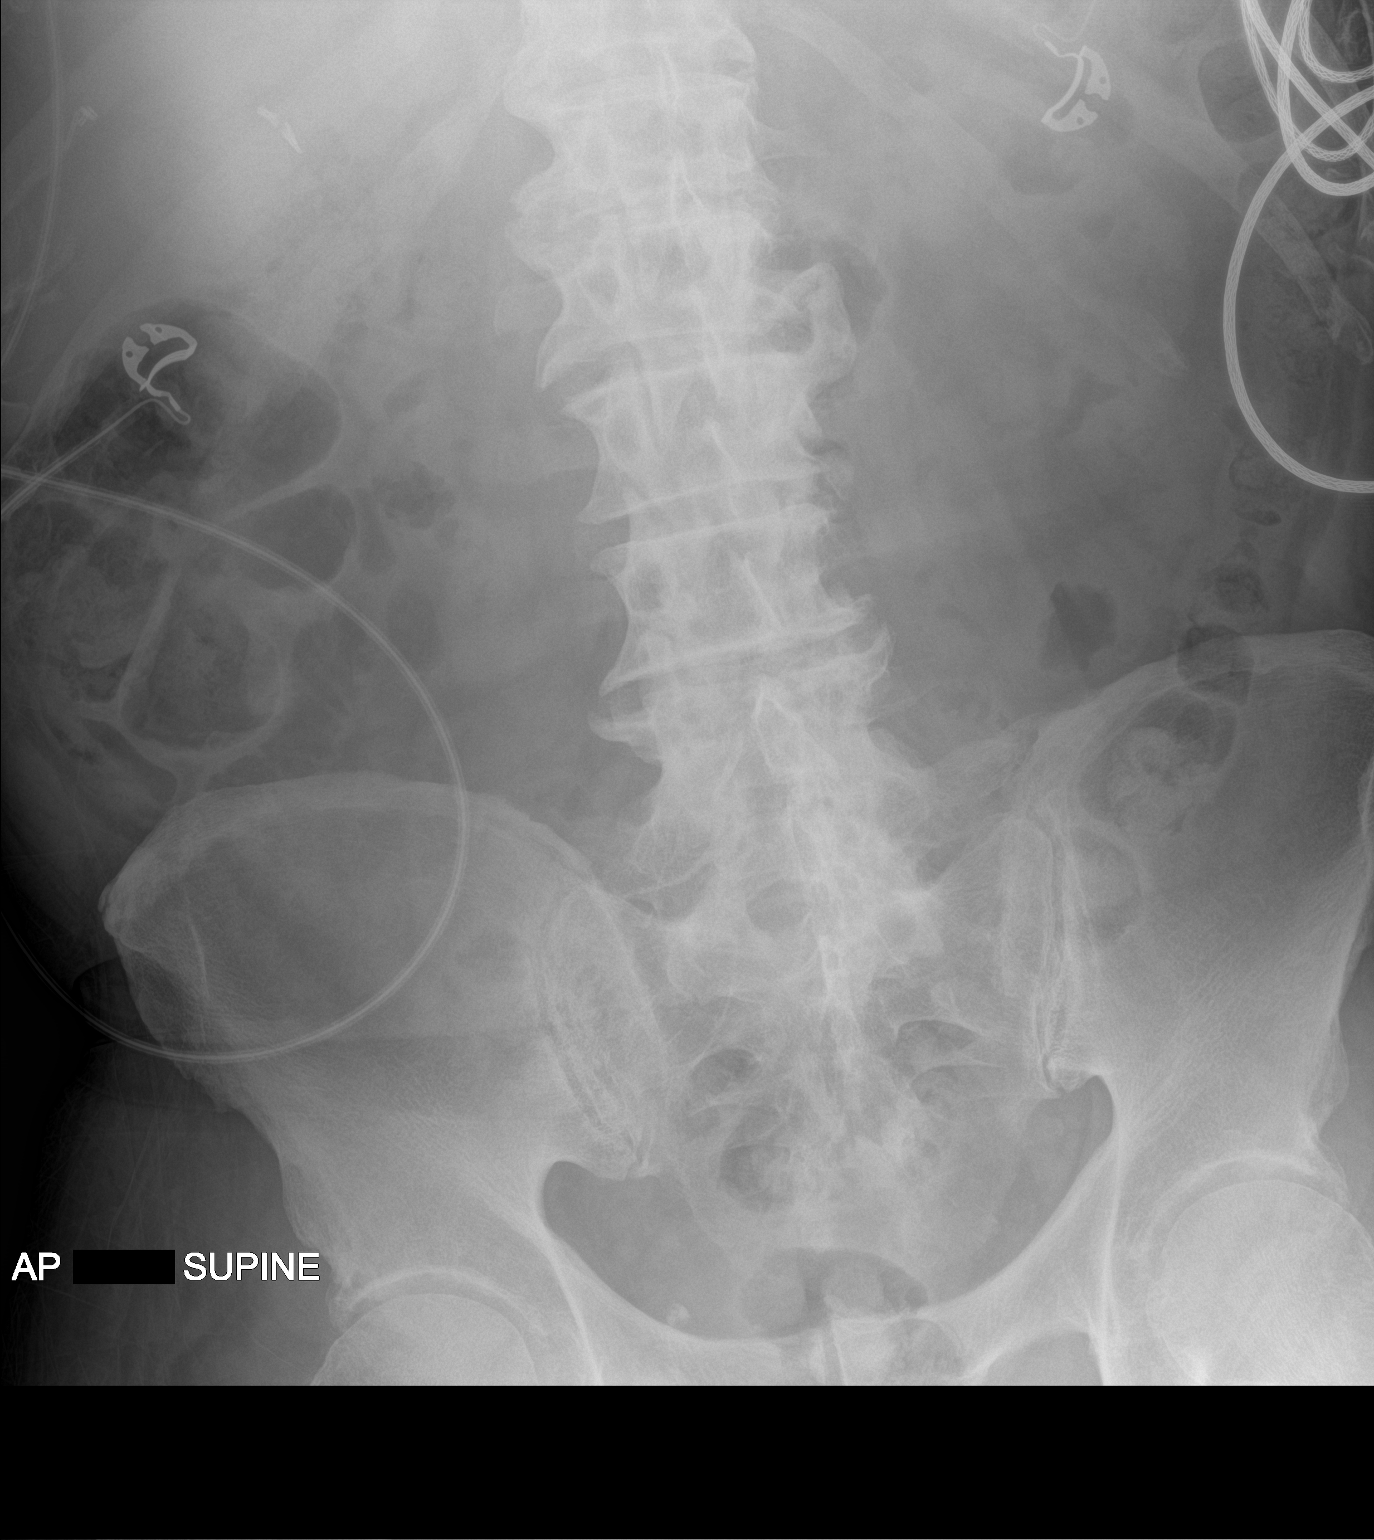

[abdomen kub (2 of 2)]
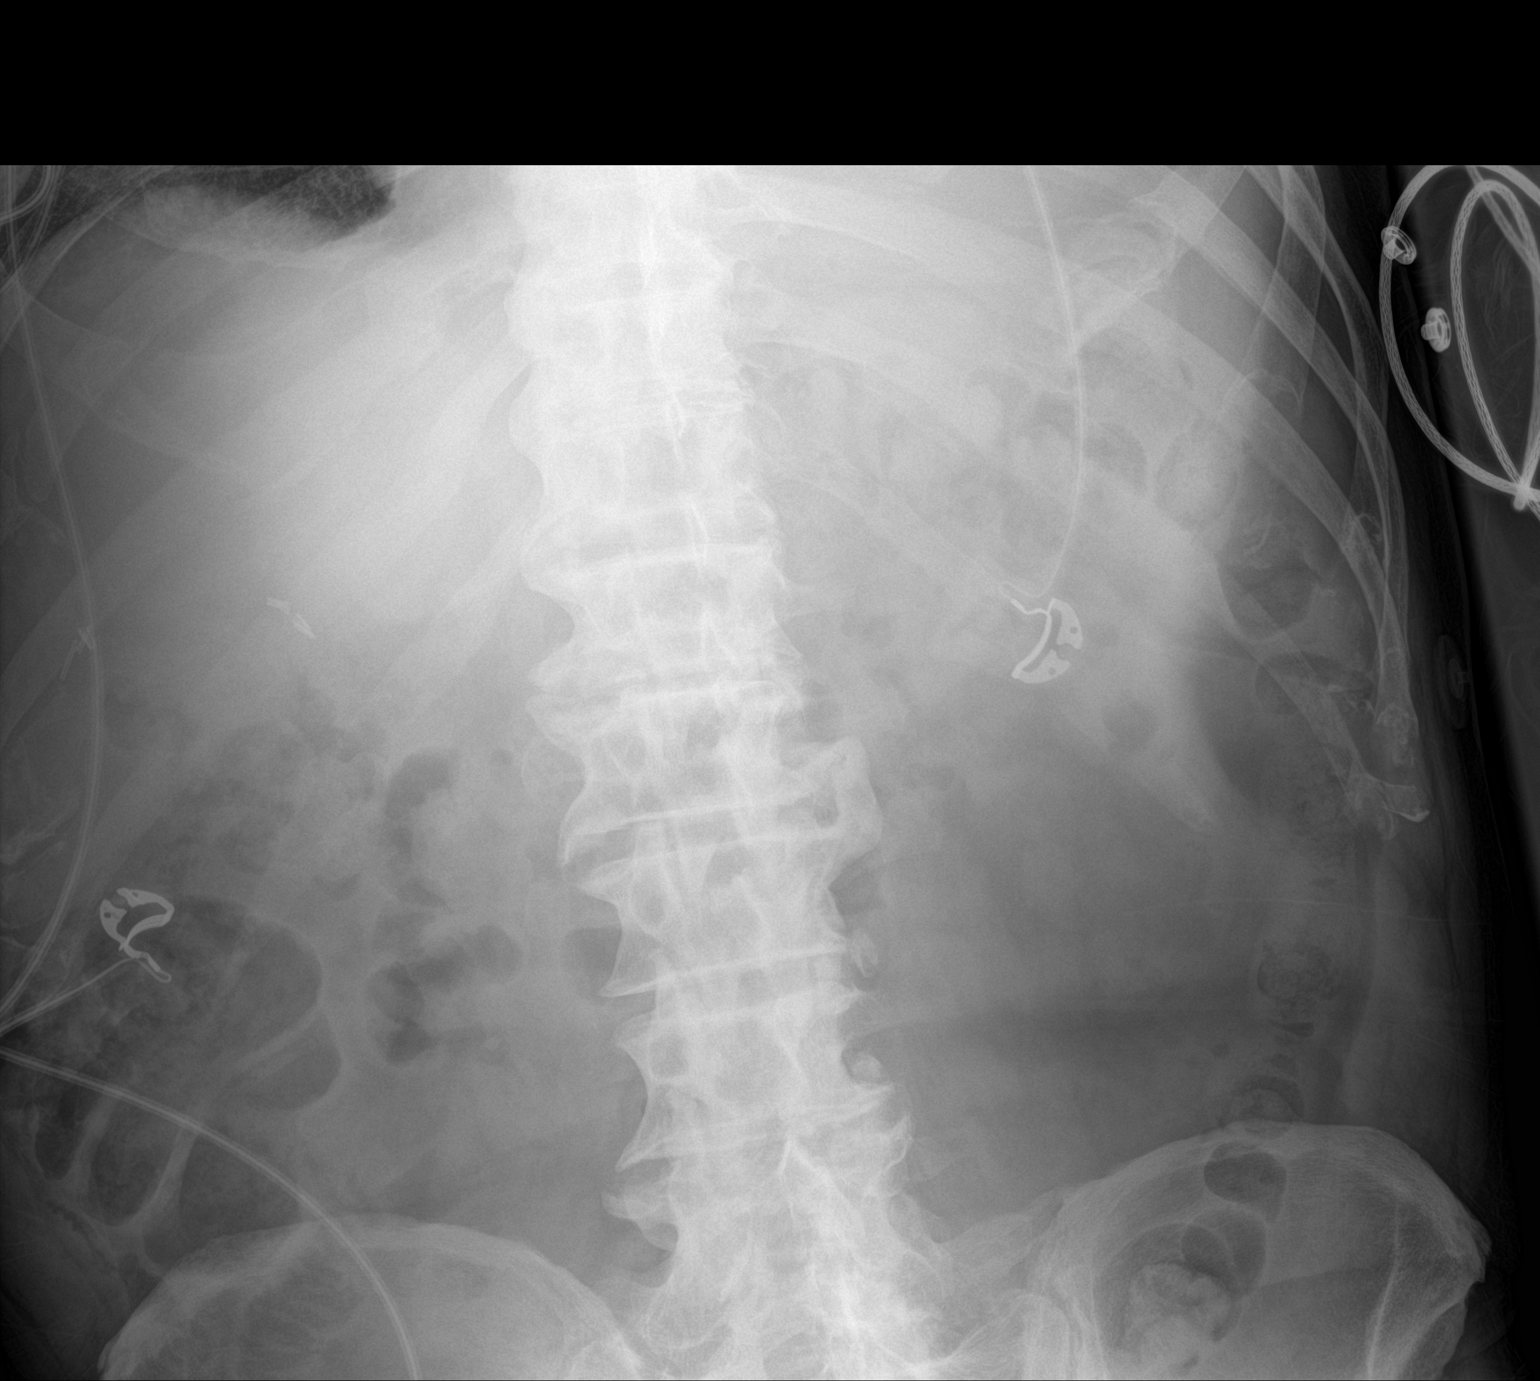

[2 of 2 positions shown; findings below may reference images not displayed]

FINDINGS: Stool and gas filled portions of colon without gas distended small
bowel or large bowel. The possibility of free intraperitoneal air
cannot be assessed on a supine view.

Left base consolidation may represent atelectasis or infiltrate.

Mild degenerative changes lumbar spine.  Post cholecystectomy.
IMPRESSION: No evidence of bowel obstruction.
